# Patient Record
Sex: Female | Born: 1970
Health system: Southern US, Community
[De-identification: ages and names within clinical notes are randomized; demographics above are authoritative.]

## PROBLEM LIST (undated history)

## (undated) DIAGNOSIS — I341 Nonrheumatic mitral (valve) prolapse: Secondary | ICD-10-CM

## (undated) DIAGNOSIS — I10 Essential (primary) hypertension: Secondary | ICD-10-CM

## (undated) DIAGNOSIS — D649 Anemia, unspecified: Secondary | ICD-10-CM

## (undated) DIAGNOSIS — R42 Dizziness and giddiness: Secondary | ICD-10-CM

## (undated) DIAGNOSIS — N946 Dysmenorrhea, unspecified: Secondary | ICD-10-CM

## (undated) DIAGNOSIS — Z803 Family history of malignant neoplasm of breast: Secondary | ICD-10-CM

## (undated) DIAGNOSIS — Z8489 Family history of other specified conditions: Secondary | ICD-10-CM

## (undated) DIAGNOSIS — K219 Gastro-esophageal reflux disease without esophagitis: Secondary | ICD-10-CM

## (undated) DIAGNOSIS — T4145XA Adverse effect of unspecified anesthetic, initial encounter: Secondary | ICD-10-CM

## (undated) DIAGNOSIS — R519 Headache, unspecified: Secondary | ICD-10-CM

## (undated) DIAGNOSIS — C801 Malignant (primary) neoplasm, unspecified: Secondary | ICD-10-CM

## (undated) DIAGNOSIS — R51 Headache: Secondary | ICD-10-CM

## (undated) DIAGNOSIS — R112 Nausea with vomiting, unspecified: Secondary | ICD-10-CM

## (undated) DIAGNOSIS — I059 Rheumatic mitral valve disease, unspecified: Secondary | ICD-10-CM

## (undated) DIAGNOSIS — Z9109 Other allergy status, other than to drugs and biological substances: Secondary | ICD-10-CM

## (undated) DIAGNOSIS — F419 Anxiety disorder, unspecified: Secondary | ICD-10-CM

## (undated) DIAGNOSIS — J45909 Unspecified asthma, uncomplicated: Secondary | ICD-10-CM

## (undated) DIAGNOSIS — T8859XA Other complications of anesthesia, initial encounter: Secondary | ICD-10-CM

## (undated) DIAGNOSIS — Z9889 Other specified postprocedural states: Secondary | ICD-10-CM

## (undated) HISTORY — PX: TONSILLECTOMY: SUR1361

## (undated) HISTORY — PX: MANDIBLE RECONSTRUCTION: SHX431

## (undated) HISTORY — DX: Essential (primary) hypertension: I10

## (undated) HISTORY — DX: Dysmenorrhea, unspecified: N94.6

## (undated) HISTORY — DX: Anxiety disorder, unspecified: F41.9

## (undated) HISTORY — DX: Rheumatic mitral valve disease, unspecified: I05.9

## (undated) HISTORY — DX: Other allergy status, other than to drugs and biological substances: Z91.09

## (undated) HISTORY — DX: Family history of malignant neoplasm of breast: Z80.3

## (undated) HISTORY — PX: BUNIONECTOMY: SHX129

---

## 2005-11-15 ENCOUNTER — Ambulatory Visit: Payer: Self-pay | Admitting: Unknown Physician Specialty

## 2007-05-07 DIAGNOSIS — D219 Benign neoplasm of connective and other soft tissue, unspecified: Secondary | ICD-10-CM | POA: Insufficient documentation

## 2007-06-18 ENCOUNTER — Ambulatory Visit: Payer: Self-pay | Admitting: Unknown Physician Specialty

## 2007-06-18 ENCOUNTER — Other Ambulatory Visit: Payer: Self-pay

## 2007-06-27 ENCOUNTER — Inpatient Hospital Stay: Payer: Self-pay | Admitting: Unknown Physician Specialty

## 2007-07-25 ENCOUNTER — Observation Stay: Payer: Self-pay | Admitting: Unknown Physician Specialty

## 2008-01-31 HISTORY — PX: ABDOMINAL HYSTERECTOMY: SHX81

## 2008-06-19 ENCOUNTER — Ambulatory Visit: Payer: Self-pay | Admitting: Unknown Physician Specialty

## 2008-06-24 ENCOUNTER — Ambulatory Visit: Payer: Self-pay | Admitting: Unknown Physician Specialty

## 2008-12-14 ENCOUNTER — Ambulatory Visit: Payer: Self-pay | Admitting: Podiatry

## 2008-12-18 ENCOUNTER — Ambulatory Visit: Payer: Self-pay | Admitting: Podiatry

## 2009-11-16 ENCOUNTER — Ambulatory Visit: Payer: Self-pay | Admitting: Podiatry

## 2009-11-19 ENCOUNTER — Ambulatory Visit: Payer: Self-pay | Admitting: Podiatry

## 2010-11-10 ENCOUNTER — Ambulatory Visit: Payer: Self-pay | Admitting: Obstetrics and Gynecology

## 2011-10-09 ENCOUNTER — Ambulatory Visit: Payer: Self-pay | Admitting: Family Medicine

## 2012-06-04 ENCOUNTER — Other Ambulatory Visit: Payer: Self-pay | Admitting: Obstetrics and Gynecology

## 2012-06-04 LAB — COMPREHENSIVE METABOLIC PANEL
Alkaline Phosphatase: 45 U/L — ABNORMAL LOW (ref 50–136)
BUN: 13 mg/dL (ref 7–18)
Bilirubin,Total: 0.5 mg/dL (ref 0.2–1.0)
Chloride: 106 mmol/L (ref 98–107)
Creatinine: 0.56 mg/dL — ABNORMAL LOW (ref 0.60–1.30)
EGFR (African American): >60
EGFR (Non-African Amer.): >60
Glucose: 83 mg/dL (ref 65–99)
Potassium: 3.7 mmol/L (ref 3.5–5.1)
SGOT(AST): 25 U/L (ref 15–37)

## 2012-06-04 LAB — TSH: Thyroid Stimulating Horm: 1.69 u[IU]/mL

## 2012-11-25 ENCOUNTER — Ambulatory Visit: Payer: Self-pay | Admitting: Family Medicine

## 2012-11-25 LAB — COMPREHENSIVE METABOLIC PANEL
Alkaline Phosphatase: 60 U/L (ref 50–136)
BUN: 15 mg/dL (ref 7–18)
Calcium, Total: 9.3 mg/dL (ref 8.5–10.1)
Co2: 28 mmol/L (ref 21–32)
Creatinine: 0.73 mg/dL (ref 0.60–1.30)
EGFR (African American): >60
Glucose: 103 mg/dL — ABNORMAL HIGH (ref 65–99)
Osmolality: 275 (ref 275–301)
Potassium: 3.7 mmol/L (ref 3.5–5.1)
SGOT(AST): 18 U/L (ref 15–37)
SGPT (ALT): 17 U/L (ref 12–78)
Sodium: 137 mmol/L (ref 136–145)

## 2012-11-25 LAB — CBC WITH DIFFERENTIAL/PLATELET
Eosinophil #: 0.1 10*3/uL (ref 0.0–0.7)
HCT: 38.1 % (ref 35.0–47.0)
Lymphocyte #: 1.7 10*3/uL (ref 1.0–3.6)
Lymphocyte %: 26.1 %
MCH: 31.4 pg (ref 26.0–34.0)
MCHC: 34.1 g/dL (ref 32.0–36.0)
MCV: 92 fL (ref 80–100)
Monocyte #: 0.4 x10 3/mm (ref 0.2–0.9)
Monocyte %: 5.7 %
Neutrophil %: 65.4 %
Platelet: 223 10*3/uL (ref 150–440)

## 2012-11-25 LAB — AMYLASE: Amylase: 39 U/L (ref 25–115)

## 2012-12-13 ENCOUNTER — Ambulatory Visit: Payer: Self-pay | Admitting: Obstetrics and Gynecology

## 2013-01-21 ENCOUNTER — Emergency Department: Payer: Self-pay | Admitting: Emergency Medicine

## 2014-01-26 ENCOUNTER — Telehealth: Payer: Self-pay | Admitting: *Deleted

## 2014-04-29 NOTE — Telephone Encounter (Signed)
error 

## 2014-06-18 LAB — HM PAP SMEAR: HM Pap smear: NEGATIVE

## 2014-06-18 LAB — TSH: TSH: 2.32 u[IU]/mL (ref <=5.90)

## 2014-12-07 ENCOUNTER — Encounter: Payer: Self-pay | Admitting: *Deleted

## 2014-12-11 ENCOUNTER — Ambulatory Visit: Payer: Self-pay | Admitting: Obstetrics and Gynecology

## 2015-01-12 ENCOUNTER — Other Ambulatory Visit: Payer: Self-pay | Admitting: Obstetrics and Gynecology

## 2015-01-12 DIAGNOSIS — Z1231 Encounter for screening mammogram for malignant neoplasm of breast: Secondary | ICD-10-CM

## 2015-01-14 ENCOUNTER — Other Ambulatory Visit: Payer: Self-pay | Admitting: Obstetrics and Gynecology

## 2015-01-14 ENCOUNTER — Ambulatory Visit
Admission: RE | Admit: 2015-01-14 | Discharge: 2015-01-14 | Disposition: A | Discharge status: Home / Self Care | Payer: 59 | Source: Ambulatory Visit | Attending: Obstetrics and Gynecology | Admitting: Obstetrics and Gynecology

## 2015-01-14 DIAGNOSIS — Z1231 Encounter for screening mammogram for malignant neoplasm of breast: Secondary | ICD-10-CM | POA: Diagnosis present

## 2015-01-20 ENCOUNTER — Ambulatory Visit (INDEPENDENT_AMBULATORY_CARE_PROVIDER_SITE_OTHER): Payer: 59 | Admitting: Family Medicine

## 2015-01-20 ENCOUNTER — Encounter: Payer: Self-pay | Admitting: Family Medicine

## 2015-01-20 VITALS — BP 132/88 | HR 78 | Temp 98.0°F | Resp 12 | Wt 159.0 lb

## 2015-01-20 DIAGNOSIS — K589 Irritable bowel syndrome without diarrhea: Secondary | ICD-10-CM | POA: Insufficient documentation

## 2015-01-20 DIAGNOSIS — B029 Zoster without complications: Secondary | ICD-10-CM | POA: Insufficient documentation

## 2015-01-20 DIAGNOSIS — R002 Palpitations: Secondary | ICD-10-CM

## 2015-01-20 DIAGNOSIS — I059 Rheumatic mitral valve disease, unspecified: Secondary | ICD-10-CM

## 2015-01-20 DIAGNOSIS — I10 Essential (primary) hypertension: Secondary | ICD-10-CM | POA: Insufficient documentation

## 2015-01-20 DIAGNOSIS — F419 Anxiety disorder, unspecified: Secondary | ICD-10-CM | POA: Diagnosis not present

## 2015-01-20 DIAGNOSIS — F32A Depression, unspecified: Secondary | ICD-10-CM | POA: Insufficient documentation

## 2015-01-20 DIAGNOSIS — K5792 Diverticulitis of intestine, part unspecified, without perforation or abscess without bleeding: Secondary | ICD-10-CM | POA: Insufficient documentation

## 2015-01-20 DIAGNOSIS — G43809 Other migraine, not intractable, without status migrainosus: Secondary | ICD-10-CM | POA: Insufficient documentation

## 2015-01-20 DIAGNOSIS — F329 Major depressive disorder, single episode, unspecified: Secondary | ICD-10-CM | POA: Insufficient documentation

## 2015-01-20 DIAGNOSIS — G4700 Insomnia, unspecified: Secondary | ICD-10-CM | POA: Insufficient documentation

## 2015-01-20 NOTE — Progress Notes (Signed)
Patient ID: Shelby [name], female   DOB: 02/23/1970, 44 y.o.   MRN: [id number]    Subjective:  HPI  Patient is here to discuss palpatations that started this morning, off and on. She did not eat this morning and just had coffee and water so she tried to eat and drink water and felt like her palpatations went away but noticed as soon as she stood up and tried to walk symptoms would come back. She states they were so severe nad so frequent that it took her breath away. No chest pain. Some dizziness with symptoms.  Prior to Admission medications   Medication Sig Start Date End Date Taking? Authorizing Provider  ergocalciferol (VITAMIN D2) 50000 UNITS capsule Take 50,000 Units by mouth once a week.    Historical Provider, MD  L-Methylfolate-Algae (DEPLIN 7.5) 7.5-90.314 MG CAPS Take 1 capsule by mouth daily. 05/18/14   Historical Provider, MD  losartan (COZAAR) 50 MG tablet Take 1 tablet by mouth daily. 04/27/14   Historical Provider, MD    There are no active problems to display for this patient.   Past Medical History  Diagnosis Date  . Anxiety   . Hypertension   . Family history of breast cancer   . Environmental allergies   . Painful menstrual periods     Social History   Social History  . Marital Status: Married    Spouse Name: N/A  . Number of Children: N/A  . Years of Education: N/A   Occupational History  . Not on file.   Social History Main Topics  . Smoking status: Former Research scientist (life sciences)  . Smokeless tobacco: Never Used  . Alcohol Use: Yes     Comment: occas  . Drug Use: No  . Sexual Activity: Yes   Other Topics Concern  . Not on file   Social History Narrative    Allergies  Allergen Reactions  . Shellfish Allergy     Review of Systems  Constitutional: Negative.   Respiratory: Positive for cough (from palpitations) and shortness of breath.   Cardiovascular: Positive for palpitations.  Musculoskeletal: Negative.   Neurological: Positive for dizziness.    Psychiatric/Behavioral: Negative.      There is no immunization history on file for this patient. Objective:  BP 132/88 mmHg  Pulse 78  Temp(Src) 98 F (36.7 C)  Resp 12  Wt 159 lb (72.122 kg)  SpO2 98%  Physical Exam  Constitutional: She is oriented to person, place, and time and well-developed, well-nourished, and in no distress.  HENT:  Head: Normocephalic and atraumatic.  Right Ear: External ear normal.  Left Ear: External ear normal.  Nose: Nose normal.  Eyes: Conjunctivae are normal. Pupils are equal, round, and reactive to light.  Neck: Normal range of motion. Neck supple. No thyromegaly present.  Cardiovascular: Normal rate, regular rhythm, normal heart sounds and intact distal pulses.   No murmur heard. Pulmonary/Chest: Effort normal and breath sounds normal. No respiratory distress. She has no wheezes.  Abdominal: Soft.  Musculoskeletal: Normal range of motion. She exhibits no edema or tenderness.  Lymphadenopathy:    She has no cervical adenopathy.  Neurological: She is alert and oriented to person, place, and time. Gait normal.  Skin: Skin is warm and dry.  Psychiatric: Mood, memory, affect and judgment normal.    Lab Results  Component Value Date   WBC 6.4 11/25/2012   HGB 13.0 11/25/2012   HCT 38.1 11/25/2012   PLT 223 11/25/2012   GLUCOSE 103* 11/25/2012  TSH 2.32 06/18/2014    CMP     Component Value Date/Time   NA 137 11/25/2012 1542   K 3.7 11/25/2012 1542   CL 103 11/25/2012 1542   CO2 28 11/25/2012 1542   GLUCOSE 103* 11/25/2012 1542   BUN 15 11/25/2012 1542   CREATININE 0.73 11/25/2012 1542   CALCIUM 9.3 11/25/2012 1542   PROT 7.4 11/25/2012 1542   ALBUMIN 4.0 11/25/2012 1542   AST 18 11/25/2012 1542   ALT 17 11/25/2012 1542   ALKPHOS 60 11/25/2012 1542   BILITOT 0.3 11/25/2012 1542   GFRNONAA >60 11/25/2012 1542   GFRAA >60 11/25/2012 1542    Assessment and Plan :  1. Palpitation EKG stable. Discussed this with patient in  detail. Discussed getting holter monitor vs cardiology referral to Dr. [name].Start ASA daily. Will go ahead and set up with cardiologist and get them to set up holter monitor to read. I think this is benign. - EKG 12-Lead  2. Disorder of mitral valve advised patient with her having this she could be more prone to having irregular beats. Follow.  3. Anxiety Stable.  I have done the exam and reviewed the above chart and it is accurate to the best of my knowledge.  Patient was seen and examined by Dr. Eulas Post and note was scribed by Theressa Millard, RMA.    Miguel Aschoff MD [facility] 01/20/2015 4:16 PM

## 2015-01-22 LAB — LIPID PANEL WITH LDL/HDL RATIO
CHOLESTEROL TOTAL: 225 mg/dL — AB (ref 100–199)
HDL: 96 mg/dL (ref >39)
LDL Calculated: 120 mg/dL — ABNORMAL HIGH (ref 0–99)
LDl/HDL Ratio: 1.3 ratio units (ref 0.0–3.2)
TRIGLYCERIDES: 47 mg/dL (ref 0–149)
VLDL Cholesterol Cal: 9 mg/dL (ref 5–40)

## 2015-01-22 LAB — CBC WITH DIFFERENTIAL/PLATELET
BASOS: 0 %
Basophils Absolute: 0 10*3/uL (ref 0.0–0.2)
EOS (ABSOLUTE): 0.1 10*3/uL (ref 0.0–0.4)
EOS: 2 %
HEMATOCRIT: 38.9 % (ref 34.0–46.6)
Hemoglobin: 13.5 g/dL (ref 11.1–15.9)
Immature Grans (Abs): 0 10*3/uL (ref 0.0–0.1)
Immature Granulocytes: 0 %
LYMPHS ABS: 1.9 10*3/uL (ref 0.7–3.1)
Lymphs: 36 %
MCH: 31.5 pg (ref 26.6–33.0)
MCHC: 34.7 g/dL (ref 31.5–35.7)
MCV: 91 fL (ref 79–97)
MONOS ABS: 0.3 10*3/uL (ref 0.1–0.9)
Monocytes: 6 %
Neutrophils Absolute: 3 10*3/uL (ref 1.4–7.0)
Neutrophils: 56 %
Platelets: 259 10*3/uL (ref 150–379)
RBC: 4.28 x10E6/uL (ref 3.77–5.28)
RDW: 13 % (ref 12.3–15.4)
WBC: 5.4 10*3/uL (ref 3.4–10.8)

## 2015-01-22 LAB — COMPREHENSIVE METABOLIC PANEL
ALBUMIN: 4.6 g/dL (ref 3.5–5.5)
ALK PHOS: 49 IU/L (ref 39–117)
ALT: 17 IU/L (ref 0–32)
AST: 25 IU/L (ref 0–40)
Albumin/Globulin Ratio: 1.9 (ref 1.1–2.5)
BILIRUBIN TOTAL: 0.4 mg/dL (ref 0.0–1.2)
BUN / CREAT RATIO: 23 (ref 9–23)
BUN: 16 mg/dL (ref 6–24)
CHLORIDE: 98 mmol/L (ref 96–106)
CO2: 25 mmol/L (ref 18–29)
CREATININE: 0.7 mg/dL (ref 0.57–1.00)
Calcium: 10 mg/dL (ref 8.7–10.2)
GFR calc Af Amer: 120 mL/min/{1.73_m2} (ref >59)
GFR calc non Af Amer: 104 mL/min/{1.73_m2} (ref >59)
GLOBULIN, TOTAL: 2.4 g/dL (ref 1.5–4.5)
Glucose: 91 mg/dL (ref 65–99)
POTASSIUM: 4.4 mmol/L (ref 3.5–5.2)
SODIUM: 138 mmol/L (ref 134–144)
Total Protein: 7 g/dL (ref 6.0–8.5)

## 2015-01-22 LAB — TSH: TSH: 1.79 u[IU]/mL (ref 0.450–4.500)

## 2015-01-27 ENCOUNTER — Telehealth: Payer: Self-pay | Admitting: Family Medicine

## 2015-01-27 NOTE — Telephone Encounter (Signed)
Please review-aa 

## 2015-01-27 NOTE — Telephone Encounter (Signed)
Pt states that you had talked with her about a holter moniter.She is wondering if she should get this done prior to seeing Dr [name].She states that if holter moniter results are normal she may not need to see Dr [name]

## 2015-02-02 NOTE — Telephone Encounter (Signed)
Not instead of--ok to set up Holter or if seeing Dr Candis Musa soon ok to let them do it.

## 2015-02-02 NOTE — Telephone Encounter (Signed)
Advised patient to keep appt with Dr. [name] and they will do Holter monitor at their office.

## 2015-02-04 ENCOUNTER — Encounter: Payer: Self-pay | Admitting: Family Medicine

## 2015-02-15 ENCOUNTER — Encounter: Payer: Self-pay | Admitting: Cardiovascular Disease

## 2015-02-15 ENCOUNTER — Ambulatory Visit (INDEPENDENT_AMBULATORY_CARE_PROVIDER_SITE_OTHER): Payer: 59 | Admitting: Cardiovascular Disease

## 2015-02-15 VITALS — BP 120/78 | HR 73 | Ht 67.0 in | Wt 157.5 lb

## 2015-02-15 DIAGNOSIS — I10 Essential (primary) hypertension: Secondary | ICD-10-CM

## 2015-02-15 DIAGNOSIS — I059 Rheumatic mitral valve disease, unspecified: Secondary | ICD-10-CM | POA: Diagnosis not present

## 2015-02-15 DIAGNOSIS — R002 Palpitations: Secondary | ICD-10-CM | POA: Diagnosis not present

## 2015-02-15 DIAGNOSIS — F419 Anxiety disorder, unspecified: Secondary | ICD-10-CM

## 2015-02-15 MED ORDER — PROPRANOLOL HCL 10 MG PO TABS
10.0000 mg | ORAL_TABLET | Freq: Three times a day (TID) | ORAL | Status: DC | PRN
Start: 2015-02-15 — End: 2016-02-07

## 2015-02-15 NOTE — Patient Instructions (Signed)
I think you are having APCs (PAC) extra beats from the top Others have PVCs  Please monitor pulse on your phone  Take propranolol as needed 1 to 2 pills for bad palpitations  Call if you would like a 48 hour monitor or 30 day monitor  Please call us if you have new issues that need to be addressed before your next appt.

## 2015-02-15 NOTE — Assessment & Plan Note (Signed)
Some of her palpitations are ectopy could be exacerbated by work stress or other stressors.  She does not feel depressed.

## 2015-02-15 NOTE — Assessment & Plan Note (Signed)
Etiology of her symptoms is unclear, likely APCs, unable to exclude PVCs.  She is concerned about atrial fibrillation given strong family history, mother and son,   We have recommended if she has recurrent symptoms that she contact our office for either 48 hour Holter or 30 day monitor.   We have given her a prescription for propranolol to take as needed,  10 mg dose with repeat if needed  She will also monitor her heart rate using her pulse meter on her phone  recommended she come into the office if she has recurrent symptoms for urgent EKG

## 2015-02-15 NOTE — Progress Notes (Signed)
Patient ID: Shelby [name], female    DOB: November 05, 1970, 45 y.o.   MRN: [id number]  HPI Comments:  Shelby [name]  Is a very pleasant 45  Year old woman with notes indicating history of mitral valve prolapse , murmur in her 17s who presents by referral from Dr. [name] for  palpitations, lightheadedness.    Through part of December 2016 she developed palpitations, skipping with pauses.  Symptoms seem to get worse when she was walking,  Went for several days, sometimes appreciated palpitations at nighttime.  Work has been very stressful.  Also recently separated from her husband who is alcoholic.    Palpitation symptoms persisted for several days before resolving.  Seem to come and go since that time.  EKGs showing no arrhythmia, only normal sinus rhythm  She is very concerned about her palpitations given mother with history of atrial fibrillation,  Also has a son with atrial fibrillation.   She had ectopy when she was young ,  He seemed to resolve without intervention.  She's never taken medications for symptoms, never had Holter monitor.   Otherwise she reports that she is doing well, active, healthy with good exercise tolerance   EKG on today's visit shows normal sinus rhythm with rate 73 bpm, no significant ST or T-wave changes   Allergies  Allergen Reactions  . Shellfish Allergy     Current Outpatient Prescriptions on File Prior to Visit  Medication Sig Dispense Refill  . ALPRAZolam (XANAX) 0.5 MG tablet Take 0.5 mg by mouth as needed.     Marland Kitchen EPINEPHRINE, ANAPHYLAXIS THERAPY AGENTS, EPIPEN, 0.3MG /0.3ML (Injection Device)  1 Device As directed for 0 days  Quantity: 1;  Refills: 3   Ordered :06-Oct-2010  Reginia Forts MD;  Started 06-Oct-2010 Active    . Hyoscyamine Sulfate 0.375 MG TBCR Take by mouth as needed.     Marland Kitchen ibuprofen (ADVIL,MOTRIN) 800 MG tablet Take by mouth every 6 (six) hours as needed.     Marland Kitchen L-Methylfolate-Algae (DEPLIN 7.5) 7.5-90.314 MG CAPS Take 1 capsule by mouth  daily.    Marland Kitchen losartan (COZAAR) 50 MG tablet Take 50 mg by mouth daily.     . mometasone (NASONEX) 50 MCG/ACT nasal spray Place 2 sprays into the nose daily as needed.        Past Medical History  Diagnosis Date  . Anxiety   . Hypertension   . Family history of breast cancer   . Environmental allergies   . Painful menstrual periods   . Mitral valve disease     Past Surgical History  Procedure Laterality Date  . Abdominal hysterectomy  2010    supracervical   . Tonsillectomy      Social History  reports that she has quit smoking. She has never used smokeless tobacco. She reports that she drinks alcohol. She reports that she does not use illicit drugs.  Family History family history includes Breast cancer in her maternal grandmother and mother; Cancer in her maternal uncle; Cancer (age of onset: 60) in her mother; Cancer (age of onset: 24) in her maternal grandmother; Diabetes in her father.   Review of Systems  Constitutional: Negative.   Respiratory: Negative.   Cardiovascular: Positive for palpitations.  Gastrointestinal: Negative.   Musculoskeletal: Negative.   Neurological: Negative.   Hematological: Negative.   Psychiatric/Behavioral: The patient is nervous/anxious.   All other systems reviewed and are negative.   BP 120/78 mmHg  Pulse 73  Ht 5\' 7"  (1.702 m)  Wt  157 lb 8 oz (71.442 kg)  BMI 24.66 kg/m2   Physical Exam  Constitutional: She is oriented to person, place, and time. She appears well-developed and well-nourished.  HENT:  Head: Normocephalic.  Nose: Nose normal.  Mouth/Throat: Oropharynx is clear and moist.  Eyes: Conjunctivae are normal. Pupils are equal, round, and reactive to light.  Neck: Normal range of motion. Neck supple. No JVD present.  Cardiovascular: Normal rate, regular rhythm, normal heart sounds and intact distal pulses.  Exam reveals no gallop and no friction rub.   No murmur heard. Pulmonary/Chest: Effort normal and breath sounds  normal. No respiratory distress. She has no wheezes. She has no rales. She exhibits no tenderness.  Abdominal: Soft. Bowel sounds are normal. She exhibits no distension. There is no tenderness.  Musculoskeletal: Normal range of motion. She exhibits no edema or tenderness.  Lymphadenopathy:    She has no cervical adenopathy.  Neurological: She is alert and oriented to person, place, and time. Coordination normal.  Skin: Skin is warm and dry. No rash noted. No erythema.  Psychiatric: She has a normal mood and affect. Her behavior is normal. Judgment and thought content normal.

## 2015-02-15 NOTE — Assessment & Plan Note (Signed)
Blood pressure well controlled  If needed, we could change her losartan to something such as bystolic  If heart rate control and blood pressure control as needed.

## 2015-02-15 NOTE — Assessment & Plan Note (Signed)
No significant murmur appreciated on exam  She reports  Having prior echocardiograms in the past, some of which have documented mitral valve prolapse, others which made no mention.  Likely of little clinical significance, no further echocardiogram needed.

## 2015-02-22 ENCOUNTER — Ambulatory Visit: Payer: 59 | Admitting: Family Medicine

## 2015-03-10 ENCOUNTER — Ambulatory Visit
Admission: RE | Admit: 2015-03-10 | Discharge: 2015-03-10 | Disposition: A | Discharge status: Home / Self Care | Payer: 59 | Source: Ambulatory Visit | Attending: Family Medicine | Admitting: Family Medicine

## 2015-03-10 ENCOUNTER — Ambulatory Visit (INDEPENDENT_AMBULATORY_CARE_PROVIDER_SITE_OTHER): Payer: 59 | Admitting: Family Medicine

## 2015-03-10 ENCOUNTER — Telehealth: Payer: Self-pay

## 2015-03-10 ENCOUNTER — Encounter: Payer: Self-pay | Admitting: Family Medicine

## 2015-03-10 VITALS — BP 120/72 | HR 84 | Temp 99.0°F | Resp 16 | Wt 156.0 lb

## 2015-03-10 DIAGNOSIS — R05 Cough: Secondary | ICD-10-CM | POA: Diagnosis not present

## 2015-03-10 DIAGNOSIS — R079 Chest pain, unspecified: Secondary | ICD-10-CM | POA: Insufficient documentation

## 2015-03-10 DIAGNOSIS — J40 Bronchitis, not specified as acute or chronic: Secondary | ICD-10-CM | POA: Insufficient documentation

## 2015-03-10 DIAGNOSIS — J029 Acute pharyngitis, unspecified: Secondary | ICD-10-CM | POA: Insufficient documentation

## 2015-03-10 MED ORDER — AMOXICILLIN-POT CLAVULANATE 875-125 MG PO TABS: 1.0000 | ORAL_TABLET | Freq: Two times a day (BID) | ORAL | Status: DC

## 2015-03-10 NOTE — Progress Notes (Signed)
Patient ID: Shelby [name], female   DOB: [birth year], 45 y.o.   MRN: [id number]         [name] Female    DOB: Apr 14, 1970   45 y.o.   MRN: [id number] Visit Date: 03/10/2015  Today's Provider: Margarita Rana, MD   Chief Complaint  Patient presents with  . Cough   Subjective:    Cough This is a new problem. The current episode started in the past 7 days. The problem has been unchanged. The cough is productive of sputum. Associated symptoms include chest pain, myalgias, postnasal drip and wheezing. Pertinent negatives include no chills, ear congestion, ear pain, eye redness, fever, headaches, hemoptysis, nasal congestion, rash, rhinorrhea, sore throat, shortness of breath, sweats or weight loss.       Allergies  Allergen Reactions  . Shellfish Allergy    Previous Medications   ALPRAZOLAM (XANAX) 0.5 MG TABLET    Take 0.5 mg by mouth as needed.    BUDESONIDE (RHINOCORT ALLERGY) 32 MCG/ACT NASAL SPRAY    Place 2 sprays into both nostrils daily.   EPINEPHRINE, ANAPHYLAXIS THERAPY AGENTS,    EPIPEN, 0.3MG /0.3ML (Injection Device)  1 Device As directed for 0 days  Quantity: 1;  Refills: 3   Ordered :06-Oct-2010  Reginia Forts MD;  Started 06-Oct-2010 Active   HYOSCYAMINE SULFATE 0.375 MG TBCR    Take by mouth as needed.    IBUPROFEN (ADVIL,MOTRIN) 800 MG TABLET    Take by mouth every 6 (six) hours as needed.    L-METHYLFOLATE-ALGAE (DEPLIN 7.5) 7.5-90.314 MG CAPS    Take 1 capsule by mouth daily.   LOSARTAN (COZAAR) 50 MG TABLET    Take 50 mg by mouth daily.    MAGNESIUM GLUCONATE (MAGNESIUM 27 PO)    Take by mouth as needed.   MONTELUKAST (SINGULAIR) 10 MG TABLET    Take 10 mg by mouth at bedtime.   PROPRANOLOL (INDERAL) 10 MG TABLET    Take 1 tablet (10 mg total) by mouth 3 (three) times daily as needed.   VITAMIN C (ASCORBIC ACID) 500 MG TABLET    Take 500 mg by mouth daily.    Review of Systems  Constitutional: Positive for fatigue. Negative for fever,  chills, weight loss, diaphoresis, activity change, appetite change and unexpected weight change.  HENT: Positive for postnasal drip and voice change. Negative for ear discharge, ear pain, facial swelling, rhinorrhea, sinus pressure, sneezing, sore throat, tinnitus and trouble swallowing.   Eyes: Negative for photophobia, discharge, redness, itching and visual disturbance.  Respiratory: Positive for cough, choking, chest tightness and wheezing. Negative for apnea, hemoptysis, shortness of breath and stridor.   Cardiovascular: Positive for chest pain. Negative for palpitations and leg swelling.  Gastrointestinal: Negative.   Musculoskeletal: Positive for myalgias.  Skin: Negative for rash.  Neurological: Negative for dizziness, light-headedness and headaches.    Social History  Substance Use Topics  . Smoking status: Former Research scientist (life sciences)  . Smokeless tobacco: Never Used  . Alcohol Use: Yes     Comment: occas   Objective:   BP 120/72 mmHg  Pulse 84  Temp(Src) 99 F (37.2 C) (Oral)  Resp 16  Wt 156 lb (70.761 kg)  Physical Exam  Constitutional: She is oriented to person, place, and time. She appears well-developed and well-nourished.  HENT:  Head: Normocephalic and atraumatic.  Right Ear: External ear normal.  Left Ear: External ear normal.  Mouth/Throat: Oropharynx is clear and moist.  Eyes: Conjunctivae and EOM are normal.  Pupils are equal, round, and reactive to light.  Neck: Normal range of motion. Neck supple.  Cardiovascular: Normal rate and regular rhythm.   Pulmonary/Chest: Effort normal and breath sounds normal.  Neurological: She is alert and oriented to person, place, and time.  Psychiatric: She has a normal mood and affect. Her behavior is normal. Judgment and thought content normal.      Assessment & Plan:     1. Bronchitis New problem, also with pharyngitis. Concerning for aspiration. Will check CXR and treat. Further plan pending these results.  Patient instructed to  call back if condition worsens or does not improve.    - DG Chest 2 View; Future - amoxicillin-clavulanate (AUGMENTIN) 875-125 MG tablet; Take 1 tablet by mouth 2 (two) times daily.  Dispense: 14 tablet; Refill: 0   Patient was seen and examined by Jerrell Belfast, MD, and note scribed by Ashley Royalty, CMA.  I have reviewed the document for accuracy and completeness and I agree with above. - Jerrell Belfast, MD   Margarita Rana, MD       Margarita Rana, MD  [facility]

## 2015-03-10 NOTE — Telephone Encounter (Signed)
-----   Message from Margarita Rana, MD sent at 03/10/2015  1:43 PM EST ----- Normal CXR. Please notify patient. Thanks.

## 2015-03-10 NOTE — Telephone Encounter (Signed)
Pt advised as directed below.   Thanks,   -Laura  

## 2015-03-15 ENCOUNTER — Ambulatory Visit: Payer: 59 | Admitting: Cardiovascular Disease

## 2015-03-17 ENCOUNTER — Telehealth: Payer: Self-pay | Admitting: *Deleted

## 2015-03-17 DIAGNOSIS — I493 Ventricular premature depolarization: Secondary | ICD-10-CM

## 2015-03-17 NOTE — Telephone Encounter (Signed)
Which one do you want her to have? Her note indicates either and she doesn't have a preference.

## 2015-03-17 NOTE — Telephone Encounter (Signed)
Pt calling stating she has decided to do a monitor  She is open to doing the 30 day or 48 hour  It is up to the doctor Please advise.

## 2015-03-18 NOTE — Telephone Encounter (Signed)
Patient c/o Palpitations:  High priority if patient c/o lightheadedness and shortness of breath.  1. How long have you been having palpitations? Just having them here and there  2. Are you currently experiencing lightheadedness and shortness of breath? Does cause a little sob but not all the time  3. Have you checked your BP and heart rate? (document readings) does not have any   4. Are you experiencing any other symptoms? Was sick last week not sure if this is causing that

## 2015-03-18 NOTE — Telephone Encounter (Signed)
Please do a 30-day event monitor for the patient as she does have a family history of atrial fibrillation and frequent PVC's.

## 2015-03-19 NOTE — Telephone Encounter (Signed)
Spoke w/ pt.  Advised her of Brittany's recommendation.  She is agreeable and will expect call from [employer] today.

## 2015-03-24 ENCOUNTER — Encounter (INDEPENDENT_AMBULATORY_CARE_PROVIDER_SITE_OTHER): Payer: 59

## 2015-03-24 DIAGNOSIS — I493 Ventricular premature depolarization: Secondary | ICD-10-CM | POA: Diagnosis not present

## 2015-03-24 DIAGNOSIS — R002 Palpitations: Secondary | ICD-10-CM | POA: Diagnosis not present

## 2015-03-25 DIAGNOSIS — H5213 Myopia, bilateral: Secondary | ICD-10-CM | POA: Diagnosis not present

## 2015-03-29 ENCOUNTER — Ambulatory Visit (INDEPENDENT_AMBULATORY_CARE_PROVIDER_SITE_OTHER): Payer: 59 | Admitting: Family Medicine

## 2015-03-29 VITALS — BP 122/70 | HR 76 | Temp 98.1°F | Resp 16 | Wt 152.0 lb

## 2015-03-29 DIAGNOSIS — J301 Allergic rhinitis due to pollen: Secondary | ICD-10-CM | POA: Diagnosis not present

## 2015-03-29 DIAGNOSIS — Z91048 Other nonmedicinal substance allergy status: Secondary | ICD-10-CM | POA: Diagnosis not present

## 2015-03-29 DIAGNOSIS — J45909 Unspecified asthma, uncomplicated: Secondary | ICD-10-CM | POA: Diagnosis not present

## 2015-03-29 DIAGNOSIS — Z9109 Other allergy status, other than to drugs and biological substances: Secondary | ICD-10-CM

## 2015-03-29 MED ORDER — ALBUTEROL SULFATE HFA 108 (90 BASE) MCG/ACT IN AERS: 2.0000 | INHALATION_SPRAY | Freq: Four times a day (QID) | RESPIRATORY_TRACT | Status: DC | PRN

## 2015-03-29 MED ORDER — EPINEPHRINE 0.3 MG/0.3ML IJ SOAJ: 0.3000 mg | Freq: Once | INTRAMUSCULAR | Status: DC

## 2015-03-29 NOTE — Progress Notes (Signed)
Patient ID: Shelby [name], female   DOB: 20-Aug-1970, 45 y.o.   MRN: [id number]   Shelby [name]  MRN: [id number] DOB: 03-14-1970  Subjective:  HPI   1. Pollen allergies The patient is a 45 year old female who presents for evaluation of cough and throat irritation, believed to be secondary to allergies.  The patient has a history of pollen allergies.  The patient feels that her chest is somewhat tight.  She states her throat gets worse at night and her congestion is worse in the morning.  She has had wheezing in her throat from drainage and also cough that she gets from a tickle.   Patient Active Problem List   Diagnosis Date Noted  . Bronchitis 03/10/2015  . Palpitations 02/15/2015  . Anxiety 01/20/2015  . Clinical depression 01/20/2015  . Diverticulitis 01/20/2015  . BP (high blood pressure) 01/20/2015  . Adaptive colitis 01/20/2015  . Cannot sleep 01/20/2015  . Headache, variant migraine 01/20/2015  . Disorder of mitral valve 01/20/2015  . Herpes zona 01/20/2015  . Fibroid 05/07/2007  . Allergic rhinitis 10/29/2006    Past Medical History  Diagnosis Date  . Anxiety   . Hypertension   . Family history of breast cancer   . Environmental allergies   . Painful menstrual periods   . Mitral valve disease     Social History   Social History  . Marital Status: Married    Spouse Name: N/A  . Number of Children: N/A  . Years of Education: N/A   Occupational History  . Not on file.   Social History Main Topics  . Smoking status: Former Research scientist (life sciences)  . Smokeless tobacco: Never Used  . Alcohol Use: Yes     Comment: occas  . Drug Use: No  . Sexual Activity: Yes   Other Topics Concern  . Not on file   Social History Narrative    Outpatient Prescriptions Prior to Visit  Medication Sig Dispense Refill  . EPINEPHRINE, ANAPHYLAXIS THERAPY AGENTS, EPIPEN, 0.3MG /0.3ML (Injection Device)  1 Device As directed for 0 days  Quantity: 1;  Refills: 3   Ordered  :06-Oct-2010  Reginia Forts MD;  Started 06-Oct-2010 Active    . Hyoscyamine Sulfate 0.375 MG TBCR Take by mouth as needed.     Marland Kitchen ibuprofen (ADVIL,MOTRIN) 800 MG tablet Take by mouth every 6 (six) hours as needed.     Marland Kitchen L-Methylfolate-Algae (DEPLIN 7.5) 7.5-90.314 MG CAPS Take 1 capsule by mouth daily.    Marland Kitchen losartan (COZAAR) 50 MG tablet Take 50 mg by mouth daily.     . Magnesium Gluconate (MAGNESIUM 27 PO) Take by mouth as needed.    . montelukast (SINGULAIR) 10 MG tablet Take 10 mg by mouth at bedtime.    . propranolol (INDERAL) 10 MG tablet Take 1 tablet (10 mg total) by mouth 3 (three) times daily as needed. 90 tablet 3  . vitamin C (ASCORBIC ACID) 500 MG tablet Take 500 mg by mouth daily.    . budesonide (RHINOCORT ALLERGY) 32 MCG/ACT nasal spray Place 2 sprays into both nostrils daily. Reported on 03/29/2015    . ALPRAZolam (XANAX) 0.5 MG tablet Take 0.5 mg by mouth as needed.     Marland Kitchen amoxicillin-clavulanate (AUGMENTIN) 875-125 MG tablet Take 1 tablet by mouth 2 (two) times daily. 14 tablet 0   No facility-administered medications prior to visit.    Allergies  Allergen Reactions  . Shellfish Allergy     Review of Systems  Constitutional:  Negative for fever, chills and malaise/fatigue.  Eyes: Negative.   Respiratory: Positive for sputum production (minimal clear sputum, patient feels it is from drainage and not coming from her chest.) and wheezing. Negative for cough and shortness of breath.   Gastrointestinal: Negative.   Musculoskeletal: Negative.   Neurological: Negative for dizziness, weakness and headaches.  Endo/Heme/Allergies: Negative.   Psychiatric/Behavioral: Negative.    Objective:  BP 122/70 mmHg  Pulse 76  Temp(Src) 98.1 F (36.7 C) (Oral)  Resp 16  Wt 152 lb (68.947 kg)  Physical Exam  Constitutional: She is oriented to person, place, and time and well-developed, well-nourished, and in no distress.  HENT:  Head: Normocephalic and atraumatic.  Right Ear:  External ear normal.  Left Ear: External ear normal.  Nose: Nose normal.  Eyes: Conjunctivae are normal.  Neck: Neck supple.  Cardiovascular: Normal rate, regular rhythm and normal heart sounds.   Pulmonary/Chest: Effort normal and breath sounds normal.  Abdominal: Soft.  Neurological: She is alert and oriented to person, place, and time.  Skin: Skin is warm and dry.  Psychiatric: Mood, memory, affect and judgment normal.    Assessment and Plan :  Allergic rhinitis This is the most likely etiology of these symptoms. We'll treat this aggressively and then consider reflux if this persists. Mild generalized anxiety Presently controlled. Miguel Aschoff MD [facility] 03/29/2015 3:38 PM

## 2015-04-01 ENCOUNTER — Encounter: Payer: Self-pay | Admitting: Obstetrics and Gynecology

## 2015-04-01 ENCOUNTER — Ambulatory Visit (INDEPENDENT_AMBULATORY_CARE_PROVIDER_SITE_OTHER): Payer: 59 | Admitting: Obstetrics and Gynecology

## 2015-04-01 VITALS — BP 128/92 | HR 98 | Ht 67.0 in | Wt 151.0 lb

## 2015-04-01 DIAGNOSIS — N898 Other specified noninflammatory disorders of vagina: Secondary | ICD-10-CM | POA: Diagnosis not present

## 2015-04-01 DIAGNOSIS — N939 Abnormal uterine and vaginal bleeding, unspecified: Secondary | ICD-10-CM

## 2015-04-01 NOTE — Progress Notes (Signed)
Subjective:     Patient ID: Shelby [name], female   DOB: 05-16-1970, 45 y.o.   MRN: [id number]  HPI Vaginal light pink spotting with RLQ pain x 24 hours, reports another episode a few months ago. Denies any sexual activity in months due to separation from spouse, but no postcoital spotting before then.  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  Review of Systems See above    Objective:   Physical Exam A&Ox 4, tearful but no acute distress Well groomed female Blood pressure 128/92, pulse 98, height 5\' 7"  (1.702 m), weight 151 lb (68.493 kg).abdomen soft and non-tender Pelvic exam: VULVA: normal appearing vulva with no masses, tenderness or lesions, VAGINA: normal appearing vagina with normal color and discharge, no lesions, CERVIX: cervical discharge present - scant and pink, multiparous os, UTERUS: surgically absent, vaginal cuff well healed, ADNEXA: normal adnexa in size, nontender and no masses.    Assessment:     Cervical spotting S/p hysterectomy     Plan:     Reassured  RTC if spotting persist or returns  Melody Blum, [place]

## 2015-04-03 ENCOUNTER — Telehealth: Payer: Self-pay | Admitting: Family Medicine

## 2015-04-03 DIAGNOSIS — J40 Bronchitis, not specified as acute or chronic: Secondary | ICD-10-CM

## 2015-04-03 MED ORDER — AZITHROMYCIN 250 MG PO TABS: ORAL_TABLET | ORAL | Status: DC

## 2015-04-03 NOTE — Telephone Encounter (Signed)
Patient with worsening cough. Will send in Garden. Patient instructed to call back if condition worsens or does not improve.

## 2015-04-23 NOTE — Telephone Encounter (Signed)
Left message for pt that [employer] has not received a baseline reading on her yet. Asked her to call back and let us know if she is planning on wearing the monitor or returning it.

## 2015-05-11 ENCOUNTER — Encounter: Payer: Self-pay | Admitting: Cardiovascular Disease

## 2015-05-25 ENCOUNTER — Other Ambulatory Visit: Payer: Self-pay | Admitting: Family Medicine

## 2015-05-27 ENCOUNTER — Ambulatory Visit (INDEPENDENT_AMBULATORY_CARE_PROVIDER_SITE_OTHER): Payer: 59 | Admitting: Family Medicine

## 2015-05-27 ENCOUNTER — Encounter: Payer: Self-pay | Admitting: Family Medicine

## 2015-05-27 VITALS — BP 122/72 | Temp 98.1°F | Resp 16 | Wt 151.0 lb

## 2015-05-27 DIAGNOSIS — F40243 Fear of flying: Secondary | ICD-10-CM

## 2015-05-27 DIAGNOSIS — A09 Infectious gastroenteritis and colitis, unspecified: Secondary | ICD-10-CM | POA: Diagnosis not present

## 2015-05-27 MED ORDER — ONDANSETRON 8 MG PO TBDP: 8.0000 mg | ORAL_TABLET | Freq: Three times a day (TID) | ORAL | Status: DC | PRN

## 2015-05-27 MED ORDER — CIPROFLOXACIN HCL 500 MG PO TABS: 500.0000 mg | ORAL_TABLET | Freq: Two times a day (BID) | ORAL | Status: DC

## 2015-05-27 MED ORDER — AZITHROMYCIN 250 MG PO TABS: ORAL_TABLET | ORAL | Status: DC

## 2015-05-27 MED ORDER — ALPRAZOLAM 0.5 MG PO TABS: 0.5000 mg | ORAL_TABLET | ORAL | Status: DC | PRN

## 2015-05-27 NOTE — Progress Notes (Signed)
Patient ID: Shelby [name], female   DOB: 1970/08/30, 45 y.o.   MRN: [id number]       [name] Female    DOB: [birth year]   45 y.o.   MRN: [id number] Visit Date: 05/27/2015  Today's Provider: Wilhemena Durie, MD   Chief Complaint  Patient presents with  . fear of flying   Subjective:    HPI  Patient is going out of the country next week, and she is requesting anxiety medication to help relax her during her flight. Patient is also requesting antibiotics to have on hand so she can prevent herself from becoming sick while she is in [place].  Patient can not remember when her last Tdap was, but thinks it has been less than 10 years ago.     Allergies  Allergen Reactions  . Shellfish Allergy    Previous Medications   ALBUTEROL (PROVENTIL HFA;VENTOLIN HFA) 108 (90 BASE) MCG/ACT INHALER    Inhale 2 puffs into the lungs every 6 (six) hours as needed for wheezing or shortness of breath.   AZITHROMYCIN (ZITHROMAX) 250 MG TABLET    2 today and then one a day for 4 more days.   BUDESONIDE (RHINOCORT ALLERGY) 32 MCG/ACT NASAL SPRAY    Place 2 sprays into both nostrils daily. Reported on 04/01/2015   EPINEPHRINE 0.3 MG/0.3 ML IJ SOAJ INJECTION    Inject 0.3 mLs (0.3 mg total) into the muscle once.   EPINEPHRINE, ANAPHYLAXIS THERAPY AGENTS,    EPIPEN, 0.3MG /0.3ML (Injection Device)  1 Device As directed for 0 days  Quantity: 1;  Refills: 3   Ordered :06-Oct-2010  Reginia Forts MD;  Started 06-Oct-2010 Active   HYOSCYAMINE SULFATE 0.375 MG TBCR    Take by mouth as needed.    IBUPROFEN (ADVIL,MOTRIN) 800 MG TABLET    Take by mouth every 6 (six) hours as needed.    L-METHYLFOLATE 7.5 MG TABS    TAKE 1 TABLET BY MOUTH DAILY   LOSARTAN (COZAAR) 50 MG TABLET    TAKE 1 TABLET BY MOUTH DAILY   MAGNESIUM GLUCONATE (MAGNESIUM 27 PO)    Take by mouth as needed.   MONTELUKAST (SINGULAIR) 10 MG TABLET    Take 10 mg by mouth at bedtime.   PROPRANOLOL (INDERAL) 10 MG TABLET    Take 1  tablet (10 mg total) by mouth 3 (three) times daily as needed.   VITAMIN C (ASCORBIC ACID) 500 MG TABLET    Take 500 mg by mouth daily.    Review of Systems  Constitutional: Negative.   HENT: Negative.   Eyes: Negative.   Respiratory: Negative.   Cardiovascular: Negative.   Endocrine: Negative.   Allergic/Immunologic: Negative.   Psychiatric/Behavioral: Negative.     Social History  Substance Use Topics  . Smoking status: Former Research scientist (life sciences)  . Smokeless tobacco: Never Used  . Alcohol Use: Yes     Comment: occas   Objective:   BP 122/72 mmHg  Temp(Src) 98.1 F (36.7 C)  Resp 16  Wt 151 lb (68.493 kg)  Physical Exam  Constitutional: She is oriented to person, place, and time. She appears well-developed and well-nourished.  HENT:  Head: Normocephalic and atraumatic.  Right Ear: External ear normal.  Left Ear: External ear normal.  Nose: Nose normal.  Eyes: Conjunctivae are normal.  Neck: Neck supple.  Cardiovascular: Normal rate, regular rhythm and normal heart sounds.   Pulmonary/Chest: Effort normal and breath sounds normal.  Abdominal: Soft.  Neurological: She is alert  and oriented to person, place, and time.  Skin: Skin is warm and dry.  Psychiatric: She has a normal mood and affect. Her behavior is normal. Judgment and thought content normal.        Assessment & Plan:     1. Traveler's diarrhea Treat only if she becomes symptomatic. - ciprofloxacin (CIPRO) 500 MG tablet; Take 1 tablet (500 mg total) by mouth 2 (two) times daily.  Dispense: 14 tablet; Refill: 0 - ondansetron (ZOFRAN-ODT) 8 MG disintegrating tablet; Take 1 tablet (8 mg total) by mouth every 8 (eight) hours as needed for nausea or vomiting.  Dispense: 35 tablet; Refill: 0 - azithromycin (ZITHROMAX) 250 MG tablet; Take 2 tablets by mouth today, then 1 tablet daily thereafter.  Dispense: 6 each; Refill: 0  2. Fear of flying  - ALPRAZolam (XANAX) 0.5 MG tablet; Take 1 tablet (0.5 mg total) by mouth as  needed for anxiety. Take 1-2 tables 1 hour before flight.  Dispense: 10 tablet; Refill: 0      I have done the exam and reviewed the above chart and it is accurate to the best of my knowledge.  Frankee Gritz Cranford Mon, MD  [facility]

## 2015-06-18 ENCOUNTER — Encounter: Payer: Self-pay | Admitting: Cardiovascular Disease

## 2015-06-21 ENCOUNTER — Encounter: Payer: Self-pay | Admitting: Obstetrics and Gynecology

## 2015-06-21 ENCOUNTER — Ambulatory Visit (INDEPENDENT_AMBULATORY_CARE_PROVIDER_SITE_OTHER): Payer: 59 | Admitting: Obstetrics and Gynecology

## 2015-06-21 VITALS — BP 116/72 | HR 76 | Ht 67.0 in | Wt 149.4 lb

## 2015-06-21 DIAGNOSIS — Z01419 Encounter for gynecological examination (general) (routine) without abnormal findings: Secondary | ICD-10-CM

## 2015-06-21 NOTE — Patient Instructions (Signed)
Place annual gynecologic exam patient instructions here.

## 2015-06-21 NOTE — Progress Notes (Signed)
Subjective:   Shelby [name] is a 45 y.o. G58P0 Caucasian female here for a routine well-woman exam.  No LMP recorded. Patient has had a hysterectomy.    Current complaints: none PCP: [name]       doesn't desire labs  Social History: Sexual: heterosexual Marital Status: separated Living situation: with sons Occupation: Warehouse manager at [employer] Tobacco/alcohol: no tobacco use Illicit drugs: no history of illicit drug use  The following portions of the patient's history were reviewed and updated as appropriate: allergies, current medications, past family history, past medical history, past social history, past surgical history and problem list.  Past Medical History Past Medical History  Diagnosis Date  . Anxiety   . Hypertension   . Family history of breast cancer   . Environmental allergies   . Painful menstrual periods   . Mitral valve disease     Past Surgical History Past Surgical History  Procedure Laterality Date  . Abdominal hysterectomy  2010    supracervical   . Tonsillectomy      Gynecologic History G2P0  No LMP recorded. Patient has had a hysterectomy. Contraception: abstinence Last Pap: 2016. Results were: normal Last mammogram: 2016. Results were: normal  Obstetric History OB History  Gravida Para Term Preterm AB SAB TAB Ectopic Multiple Living  2         2    # Outcome Date GA Lbr Len/2nd Weight Sex Delivery Anes PTL Lv  2 Gravida 2005    M Vag-Spont   Y  1 Gravida 1996    M Vag-Spont   Y      Current Medications Current Outpatient Prescriptions on File Prior to Visit  Medication Sig Dispense Refill  . albuterol (PROVENTIL HFA;VENTOLIN HFA) 108 (90 Base) MCG/ACT inhaler Inhale 2 puffs into the lungs every 6 (six) hours as needed for wheezing or shortness of breath. 1 Inhaler 12  . ALPRAZolam (XANAX) 0.5 MG tablet Take 1 tablet (0.5 mg total) by mouth as needed for anxiety. Take 1-2 tables 1 hour before flight. 10 tablet 0  . EPINEPHrine  0.3 mg/0.3 mL IJ SOAJ injection Inject 0.3 mLs (0.3 mg total) into the muscle once. 1 Device 12  . Hyoscyamine Sulfate 0.375 MG TBCR Take by mouth as needed.     Marland Kitchen ibuprofen (ADVIL,MOTRIN) 800 MG tablet Take by mouth every 6 (six) hours as needed.     Marland Kitchen L-Methylfolate 7.5 MG TABS TAKE 1 TABLET BY MOUTH DAILY 90 tablet 3  . losartan (COZAAR) 50 MG tablet TAKE 1 TABLET BY MOUTH DAILY 90 tablet 3  . Magnesium Gluconate (MAGNESIUM 27 PO) Take by mouth as needed.    . montelukast (SINGULAIR) 10 MG tablet Take 10 mg by mouth at bedtime.    . vitamin C (ASCORBIC ACID) 500 MG tablet Take 500 mg by mouth daily.    Marland Kitchen azithromycin (ZITHROMAX) 250 MG tablet 2 today and then one a day for 4 more days. (Patient not taking: Reported on 05/27/2015) 6 tablet 0  . azithromycin (ZITHROMAX) 250 MG tablet Take 2 tablets by mouth today, then 1 tablet daily thereafter. (Patient not taking: Reported on 06/21/2015) 6 each 0  . budesonide (RHINOCORT ALLERGY) 32 MCG/ACT nasal spray Place 2 sprays into both nostrils daily. Reported on 06/21/2015    . ciprofloxacin (CIPRO) 500 MG tablet Take 1 tablet (500 mg total) by mouth 2 (two) times daily. (Patient not taking: Reported on 06/21/2015) 14 tablet 0  . EPINEPHRINE, ANAPHYLAXIS THERAPY AGENTS, EPIPEN,  0.3MG /0.3ML (Injection Device)  1 Device As directed for 0 days  Quantity: 1;  Refills: 3   Ordered :06-Oct-2010  Reginia Forts MD;  Started 06-Oct-2010 Active    . ondansetron (ZOFRAN-ODT) 8 MG disintegrating tablet Take 1 tablet (8 mg total) by mouth every 8 (eight) hours as needed for nausea or vomiting. (Patient not taking: Reported on 06/21/2015) 35 tablet 0  . propranolol (INDERAL) 10 MG tablet Take 1 tablet (10 mg total) by mouth 3 (three) times daily as needed. (Patient not taking: Reported on 04/01/2015) 90 tablet 3   No current facility-administered medications on file prior to visit.    Review of Systems Patient denies any headaches, blurred vision, shortness of breath,  chest pain, abdominal pain, problems with bowel movements, urination, or intercourse.  Objective:  BP 116/72 mmHg  Pulse 76  Ht 5\' 7"  (1.702 m)  Wt 149 lb 6.4 oz (67.767 kg)  BMI 23.39 kg/m2 Physical Exam  General:  Well developed, well nourished, no acute distress. She is alert and oriented x3. Skin:  Warm and dry Neck:  Midline trachea, no thyromegaly or nodules Cardiovascular: Regular rate and rhythm, no murmur heard Lungs:  Effort normal, all lung fields clear to auscultation bilaterally Breasts:  No dominant palpable mass, retraction, or nipple discharge Abdomen:  Soft, non tender, no hepatosplenomegaly or masses Pelvic:  External genitalia is normal in appearance.  The vagina is normal in appearance. The cervix is bulbous, no CMT.  Thin prep pap is not done. Uterus is surgically absent.  No adnexal masses or tenderness noted. Extremities:  No swelling or varicosities noted Psych:  She has a normal mood and affect  Assessment:   Healthy well-woman exam S/P hysterectomy  Plan:   F/U 1 year for AE, or sooner if needed Mammogram scheduled   Oliva Montecalvo Rockney Ghee, CNM

## 2015-06-22 ENCOUNTER — Encounter: Payer: Self-pay | Admitting: Obstetrics and Gynecology

## 2015-06-29 ENCOUNTER — Telehealth: Payer: Self-pay | Admitting: Obstetrics and Gynecology

## 2015-06-29 ENCOUNTER — Other Ambulatory Visit: Payer: Self-pay | Admitting: *Deleted

## 2015-06-29 MED ORDER — FLUCONAZOLE 150 MG PO TABS: 150.0000 mg | ORAL_TABLET | Freq: Once | ORAL | Status: DC

## 2015-06-29 NOTE — Telephone Encounter (Signed)
Done-ac 

## 2015-06-29 NOTE — Telephone Encounter (Signed)
Pt was just here a week ago and now has a yeast infection/ itching, burning. She used monistat and it got a lil better. Today its aggrevating her again. She [facility] to [facility]

## 2015-07-19 DIAGNOSIS — H43813 Vitreous degeneration, bilateral: Secondary | ICD-10-CM | POA: Diagnosis not present

## 2015-07-30 ENCOUNTER — Ambulatory Visit
Admission: RE | Admit: 2015-07-30 | Discharge: 2015-07-30 | Disposition: A | Discharge status: Home / Self Care | Payer: 59 | Source: Ambulatory Visit | Attending: Family Medicine | Admitting: Family Medicine

## 2015-07-30 ENCOUNTER — Encounter: Payer: Self-pay | Admitting: Family Medicine

## 2015-07-30 ENCOUNTER — Ambulatory Visit (INDEPENDENT_AMBULATORY_CARE_PROVIDER_SITE_OTHER): Payer: 59 | Admitting: Family Medicine

## 2015-07-30 ENCOUNTER — Other Ambulatory Visit: Payer: Self-pay | Admitting: Family Medicine

## 2015-07-30 VITALS — BP 138/72 | Temp 98.6°F | Resp 16 | Wt 145.0 lb

## 2015-07-30 DIAGNOSIS — S92351A Displaced fracture of fifth metatarsal bone, right foot, initial encounter for closed fracture: Secondary | ICD-10-CM | POA: Diagnosis not present

## 2015-07-30 DIAGNOSIS — M79671 Pain in right foot: Secondary | ICD-10-CM

## 2015-07-30 DIAGNOSIS — X58XXXA Exposure to other specified factors, initial encounter: Secondary | ICD-10-CM | POA: Insufficient documentation

## 2015-07-30 DIAGNOSIS — S92354A Nondisplaced fracture of fifth metatarsal bone, right foot, initial encounter for closed fracture: Secondary | ICD-10-CM | POA: Insufficient documentation

## 2015-07-30 NOTE — Progress Notes (Signed)
[name] Female    DOB: Jun 19, 1970   45 y.o.   MRN: [id number] Visit Date: 07/30/2015  Today's Provider: Vernie Murders, PA   Chief Complaint  Patient presents with  . Foot Pain    since this morning.    Subjective:    Foot Pain This is a new problem. The current episode started today. The problem occurs constantly. The problem has been gradually worsening. Associated symptoms include arthralgias and myalgias. Pertinent negatives include no numbness or weakness. The symptoms are aggravated by walking. She has tried nothing for the symptoms.  Patient reports that she was walking her dogs this morning, and tripped over the pavement on her driveway at home. She has pain and swelling in her right foot. Patient has been wearing an ankle brace for support. Patient reports that the pain is worse when she bears weight on the right foot.   Past Medical History  Diagnosis Date  . Anxiety   . Hypertension   . Family history of breast cancer   . Environmental allergies   . Painful menstrual periods   . Mitral valve disease    Past Surgical History  Procedure Laterality Date  . Abdominal hysterectomy  2010    supracervical   . Tonsillectomy     Family History  Problem Relation Age of Onset  . Cancer Mother 57    breast  . Breast cancer Mother   . Diabetes Father   . Cancer Maternal Uncle     colon  . Cancer Maternal Grandmother 74    breast  . Breast cancer Maternal Grandmother    Allergies  Allergen Reactions  . Shellfish Allergy    Current Meds  Medication Sig  . albuterol (PROVENTIL HFA;VENTOLIN HFA) 108 (90 Base) MCG/ACT inhaler Inhale 2 puffs into the lungs every 6 (six) hours as needed for wheezing or shortness of breath.  . ALPRAZolam (XANAX) 0.5 MG tablet Take 1 tablet (0.5 mg total) by mouth as needed for anxiety. Take 1-2 tables 1 hour before flight.  . budesonide (RHINOCORT ALLERGY) 32 MCG/ACT nasal spray Place 2 sprays into both nostrils  daily. Reported on 06/21/2015  . EPINEPHrine 0.3 mg/0.3 mL IJ SOAJ injection Inject 0.3 mLs (0.3 mg total) into the muscle once.  Marland Kitchen EPINEPHRINE, ANAPHYLAXIS THERAPY AGENTS, EPIPEN, 0.3MG /0.3ML (Injection Device)  1 Device As directed for 0 days  Quantity: 1;  Refills: 3   Ordered :06-Oct-2010  Reginia Forts MD;  Started 06-Oct-2010 Active  . Hyoscyamine Sulfate 0.375 MG TBCR Take by mouth as needed.   Marland Kitchen ibuprofen (ADVIL,MOTRIN) 800 MG tablet Take by mouth every 6 (six) hours as needed.   Marland Kitchen L-Methylfolate 7.5 MG TABS TAKE 1 TABLET BY MOUTH DAILY  . losartan (COZAAR) 50 MG tablet TAKE 1 TABLET BY MOUTH DAILY  . Magnesium Gluconate (MAGNESIUM 27 PO) Take by mouth as needed.  . montelukast (SINGULAIR) 10 MG tablet Take 10 mg by mouth at bedtime.  . vitamin C (ASCORBIC ACID) 500 MG tablet Take 500 mg by mouth daily.    Review of Systems  Constitutional: Negative.   Musculoskeletal: Positive for myalgias, arthralgias and gait problem.       Has difficulty walking on right foot.   Skin: Negative.   Neurological: Negative for weakness and numbness.    Social History  Substance Use Topics  . Smoking status: Former Research scientist (life sciences)  . Smokeless tobacco: Never Used  . Alcohol Use: Yes     Comment:  occas   Objective:   BP 138/72 mmHg  Temp(Src) 98.6 F (37 C)  Resp 16  Wt 145 lb (65.772 kg)  Physical Exam  Constitutional: She is oriented to person, place, and time. She appears well-developed and well-nourished. No distress.  HENT:  Head: Normocephalic and atraumatic.  Right Ear: Hearing normal.  Left Ear: Hearing normal.  Nose: Nose normal.  Eyes: Conjunctivae and lids are normal. Right eye exhibits no discharge. Left eye exhibits no discharge. No scleral icterus.  Pulmonary/Chest: Effort normal. No respiratory distress.  Musculoskeletal: She exhibits tenderness.  Swelling over the right foot 5th tarsal-metatarsal area. Very tender to palpate or dorsiflex foot. Good pulses without neurologic  deficit.  Neurological: She is alert and oriented to person, place, and time.  Skin: Skin is intact. No lesion and no rash noted.  Psychiatric: She has a normal mood and affect. Her speech is normal and behavior is normal. Thought content normal.      Assessment & Plan:     1. Pain in right foot Stumbled on uneven concrete seam at home and twisted the right foot. Will get x-ray and recommend a walking boot cast. May use Ibuprofen 600-800 mg TID with food for pain. Elevate foot as much as possible. Recheck pending x-ray report. - DG Foot Complete Right      Vernie Murders, PA  [facility]

## 2015-07-30 NOTE — Addendum Note (Signed)
Addended by: Vernie Murders E on: 07/30/2015 02:44 PM   Modules accepted: Orders

## 2015-08-02 DIAGNOSIS — S92354A Nondisplaced fracture of fifth metatarsal bone, right foot, initial encounter for closed fracture: Secondary | ICD-10-CM | POA: Diagnosis not present

## 2015-08-30 ENCOUNTER — Other Ambulatory Visit: Payer: Self-pay | Admitting: Physician Assistant

## 2015-08-30 ENCOUNTER — Other Ambulatory Visit: Payer: Self-pay

## 2015-08-30 DIAGNOSIS — L821 Other seborrheic keratosis: Secondary | ICD-10-CM | POA: Diagnosis not present

## 2015-08-30 DIAGNOSIS — D1801 Hemangioma of skin and subcutaneous tissue: Secondary | ICD-10-CM | POA: Diagnosis not present

## 2015-08-30 DIAGNOSIS — L814 Other melanin hyperpigmentation: Secondary | ICD-10-CM | POA: Diagnosis not present

## 2015-08-30 DIAGNOSIS — L74510 Primary focal hyperhidrosis, axilla: Secondary | ICD-10-CM | POA: Diagnosis not present

## 2015-08-30 MED ORDER — MONTELUKAST SODIUM 10 MG PO TABS: 10.0000 mg | ORAL_TABLET | Freq: Every day | ORAL | 12 refills | Status: DC

## 2015-10-25 ENCOUNTER — Other Ambulatory Visit: Payer: Self-pay | Admitting: Family Medicine

## 2015-10-25 DIAGNOSIS — F40243 Fear of flying: Secondary | ICD-10-CM

## 2015-10-25 MED ORDER — ALPRAZOLAM 0.5 MG PO TABS: 0.5000 mg | ORAL_TABLET | ORAL | 0 refills | Status: DC | PRN

## 2015-10-25 NOTE — Telephone Encounter (Signed)
ok 

## 2015-10-25 NOTE — Telephone Encounter (Signed)
Pt states she is going on trip and flying.  Pt is requesting a Rx for ALPRAZolam (XANAX) 0.5 MG tablet for anxiety.  Pt is requesting 10 pills.  Pt will need to pick these up by Wednesday morning.   [facility].  CB#[phone]/MW

## 2015-10-25 NOTE — Telephone Encounter (Signed)
Please review-aa 

## 2015-10-25 NOTE — Telephone Encounter (Signed)
RX called in, updated in the chart and patient advised-aa

## 2015-11-18 ENCOUNTER — Telehealth: Payer: Self-pay | Admitting: Family Medicine

## 2015-11-18 NOTE — Telephone Encounter (Signed)
Pt stated that she had azithromycin (ZITHROMAX) 250 MG tablet from the last time she was in the office for possible sinus infection that she didn't take. Pt stated that she had a provider at the [facility] look at her and was advised that she had a sinus infection so she started taking the medication and now has symptoms of yeast infection and would like Diflucan called into [facility] before 12 pm tomorrow b/c she is leaving to go out of town. Pt was advised Dr. [name] is out of the office on Fridays and pt request if he couldn't get the request if another provider could call it in. Please advise. Thanks TNP

## 2015-11-18 NOTE — Telephone Encounter (Signed)
Please review-aa 

## 2015-11-20 NOTE — Telephone Encounter (Signed)
150mg  daily times 2

## 2015-11-22 NOTE — Telephone Encounter (Signed)
Called patient but patient needed this before the weekend, patient took care of it already, RX not sent in- -aa

## 2015-12-01 ENCOUNTER — Encounter: Payer: Self-pay | Admitting: Obstetrics and Gynecology

## 2016-02-07 ENCOUNTER — Ambulatory Visit (INDEPENDENT_AMBULATORY_CARE_PROVIDER_SITE_OTHER): Payer: 59 | Admitting: Physician Assistant

## 2016-02-07 VITALS — BP 116/78 | HR 76 | Temp 97.9°F | Resp 16 | Wt 146.0 lb

## 2016-02-07 DIAGNOSIS — J029 Acute pharyngitis, unspecified: Secondary | ICD-10-CM | POA: Diagnosis not present

## 2016-02-07 LAB — POCT RAPID STREP A (OFFICE): Rapid Strep A Screen: NEGATIVE

## 2016-02-07 MED ORDER — AMOXICILLIN 500 MG PO CAPS: 500.0000 mg | ORAL_CAPSULE | Freq: Two times a day (BID) | ORAL | 0 refills | Status: AC

## 2016-02-07 NOTE — Progress Notes (Signed)
Shelby [name]  Chief Complaint  Patient presents with  . URI    Subjective:    Patient ID: Shelby [name], female    DOB: 07/01/1970, 46 y.o.   MRN: [id number]  Upper Respiratory Infection: Shelby [name] is a 46 y.o. female complaining of symptoms of a URI. Symptoms include right ear pain, congestion and sore throat. Onset of symptoms was 3 days ago, unchanged since that time. 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 She also c/o lightheadedness and low grade fever for the past 1 day . No nausea/vomiting.  She is drinking plenty of fluids. Evaluation to date: none. Treatment to date: none. The treatment has provided minimal.   Review of Systems  Constitutional: Positive for chills and fatigue. Negative for activity change, appetite change, diaphoresis, fever and unexpected weight change.  HENT: Positive for congestion, ear pain (Right ear pain), postnasal drip, rhinorrhea and sore throat. Negative for ear discharge, sinus pain, sinus pressure and trouble swallowing.   Respiratory: Negative for apnea, cough, choking, chest tightness, shortness of breath, wheezing and stridor.   Gastrointestinal: Negative.   Musculoskeletal: Positive for myalgias.  Neurological: Positive for dizziness. Negative for light-headedness and headaches.       Objective:   BP 116/78 (BP Location: Right Arm, Patient Position: Sitting, Cuff Size: Normal)   Pulse 76   Temp 97.9 F (36.6 C) (Oral)   Resp 16   Wt 146 lb (66.2 kg)   BMI 22.87 kg/m   Patient Active Problem List   Diagnosis Date Noted  . Bronchitis 03/10/2015  . Palpitations 02/15/2015  . Anxiety 01/20/2015  . Clinical depression 01/20/2015  . Diverticulitis 01/20/2015  . BP (high blood pressure) 01/20/2015  . Adaptive colitis 01/20/2015  . Cannot sleep 01/20/2015  . Headache, variant migraine 01/20/2015  . Disorder of mitral valve 01/20/2015  . Herpes zona 01/20/2015  . Fibroid 05/07/2007  . Allergic  rhinitis 10/29/2006    Outpatient Encounter Prescriptions as of 02/07/2016  Medication Sig Note  . budesonide (RHINOCORT ALLERGY) 32 MCG/ACT nasal spray Place 2 sprays into both nostrils daily. Reported on 06/21/2015 03/29/2015: Possibly caused her throat irritation  . EPINEPHrine 0.3 mg/0.3 mL IJ SOAJ injection Inject 0.3 mLs (0.3 mg total) into the muscle once.   Marland Kitchen Hyoscyamine Sulfate 0.375 MG TBCR Take by mouth as needed.  01/20/2015: Medication taken as needed.  Received from: [facility]  . ibuprofen (ADVIL,MOTRIN) 800 MG tablet Take by mouth every 6 (six) hours as needed.  01/20/2015: Medication taken as needed.  Received from: [facility]  . L-Methylfolate 7.5 MG TABS TAKE 1 TABLET BY MOUTH DAILY   . losartan (COZAAR) 50 MG tablet TAKE 1 TABLET BY MOUTH DAILY   . Magnesium Gluconate (MAGNESIUM 27 PO) Take by mouth as needed.   . montelukast (SINGULAIR) 10 MG tablet Take 1 tablet (10 mg total) by mouth at bedtime.   . vitamin C (ASCORBIC ACID) 500 MG tablet Take 500 mg by mouth daily.   . [DISCONTINUED] ondansetron (ZOFRAN-ODT) 8 MG disintegrating tablet Take 1 tablet (8 mg total) by mouth every 8 (eight) hours as needed for nausea or vomiting.   . [DISCONTINUED] propranolol (INDERAL) 10 MG tablet Take 1 tablet (10 mg total) by mouth 3 (three) times daily as needed.   Marland Kitchen albuterol (PROVENTIL HFA;VENTOLIN HFA) 108 (90 Base) MCG/ACT inhaler Inhale 2 puffs into the lungs every 6 (six) hours as needed for wheezing or shortness of breath. (Patient not taking:  Reported on 02/07/2016)   . ALPRAZolam (XANAX) 0.5 MG tablet Take 1 tablet (0.5 mg total) by mouth as needed for anxiety. (Patient not taking: Reported on 02/07/2016)   . amoxicillin (AMOXIL) 500 MG capsule Take 1 capsule (500 mg total) by mouth 2 (two) times daily.   . [DISCONTINUED] EPINEPHRINE, ANAPHYLAXIS THERAPY AGENTS, EPIPEN, 0.3MG /0.3ML (Injection Device)  1 Device As directed for 0 days  Quantity: 1;   Refills: 3   Ordered :06-Oct-2010  Reginia Forts MD;  Started 06-Oct-2010 Active 01/20/2015: Received from: [facility]   No facility-administered encounter medications on file as of 02/07/2016.     Allergies  Allergen Reactions  . Shellfish Allergy        Physical Exam  Constitutional: She is oriented to person, place, and time. She appears well-developed and well-nourished.  HENT:  Nose: Right sinus exhibits no maxillary sinus tenderness and no frontal sinus tenderness. Left sinus exhibits no maxillary sinus tenderness and no frontal sinus tenderness.  Mouth/Throat: Mucous membranes are normal. Posterior oropharyngeal edema and posterior oropharyngeal erythema present. No oropharyngeal exudate.  Tms opaque bilaterally  Eyes: Conjunctivae are normal.  Neck: Neck supple.  Cardiovascular: Normal rate and regular rhythm.   Pulmonary/Chest: Effort normal and breath sounds normal.  Lymphadenopathy:    She has no cervical adenopathy.  Neurological: She is alert and oriented to person, place, and time.  Skin: Skin is warm and dry.  Psychiatric: She has a normal mood and affect. Her behavior is normal.       Assessment & Plan:   1. Sore throat  Do think it's part of a viral syndrome, especially considering her work. However, throat does look very edematous and red. Rapid strep negative, but will give abx prescription for patient to fill if her sore throat continues for longer than three more days.   - POCT rapid strep A - amoxicillin (AMOXIL) 500 MG capsule; Take 1 capsule (500 mg total) by mouth 2 (two) times daily.  Dispense: 20 capsule; Refill: 0   Return if symptoms worsen or fail to improve.  The entirety of the information documented in the History of Present Illness, Review of Systems and Physical Exam were personally obtained by me. Portions of this information were initially documented by Ashley Royalty, CMA and reviewed by me for thoroughness and accuracy.    Patient Instructions  Pharyngitis Pharyngitis is redness, pain, and swelling (inflammation) of your pharynx. What are the causes? Pharyngitis is usually caused by infection. Most of the time, these infections are from viruses (viral) and are part of a cold. However, sometimes pharyngitis is caused by bacteria (bacterial). Pharyngitis can also be caused by allergies. Viral pharyngitis may be spread from person to person by coughing, sneezing, and personal items or utensils (cups, forks, spoons, toothbrushes). Bacterial pharyngitis may be spread from person to person by more intimate contact, such as kissing. What are the signs or symptoms? Symptoms of pharyngitis include:  Sore throat.  Tiredness (fatigue).  Low-grade fever.  Headache.  Joint pain and muscle aches.  Skin rashes.  Swollen lymph nodes.  Plaque-like film on throat or tonsils (often seen with bacterial pharyngitis). How is this diagnosed? Your health care provider will ask you questions about your illness and your symptoms. Your medical history, along with a physical exam, is often all that is needed to diagnose pharyngitis. Sometimes, a rapid strep test is done. Other lab tests may also be done, depending on the suspected cause. How is this treated? Viral pharyngitis  will usually get better in 3-4 days without the use of medicine. Bacterial pharyngitis is treated with medicines that kill germs (antibiotics). Follow these instructions at home:  Drink enough water and fluids to keep your urine clear or pale yellow.  Only take over-the-counter or prescription medicines as directed by your health care provider:  If you are prescribed antibiotics, make sure you finish them even if you start to feel better.  Do not take aspirin.  Get lots of rest.  Gargle with 8 oz of salt water ( tsp of salt per 1 qt of water) as often as every 1-2 hours to soothe your throat.  Throat lozenges (if you are not at risk for choking)  or sprays may be used to soothe your throat. Contact a health care provider if:  You have large, tender lumps in your neck.  You have a rash.  You cough up green, yellow-brown, or bloody spit. Get help right away if:  Your neck becomes stiff.  You drool or are unable to swallow liquids.  You vomit or are unable to keep medicines or liquids down.  You have severe pain that does not go away with the use of recommended medicines.  You have trouble breathing (not caused by a stuffy nose). This information is not intended to replace advice given to you by your health care provider. Make sure you discuss any questions you have with your health care provider. Document Released: 01/16/2005 Document Revised: 06/24/2015 Document Reviewed: 09/23/2012 Elsevier Interactive Patient Education  2017 [employer].     The entirety of the information documented in the History of Present Illness, Review of Systems and Physical Exam were personally obtained by me. Portions of this information were initially documented by Ashley Royalty, CMA and reviewed by me for thoroughness and accuracy.

## 2016-02-07 NOTE — Patient Instructions (Signed)

## 2016-02-08 ENCOUNTER — Encounter: Payer: Self-pay | Admitting: Physician Assistant

## 2016-03-03 ENCOUNTER — Ambulatory Visit
Admission: RE | Admit: 2016-03-03 | Discharge: 2016-03-03 | Disposition: A | Discharge status: Home / Self Care | Payer: 59 | Source: Ambulatory Visit | Attending: Obstetrics and Gynecology | Admitting: Obstetrics and Gynecology

## 2016-03-03 ENCOUNTER — Ambulatory Visit: Payer: Self-pay | Admitting: Physician Assistant

## 2016-03-03 ENCOUNTER — Encounter: Payer: Self-pay | Admitting: Physician Assistant

## 2016-03-03 VITALS — BP 120/90 | HR 75 | Temp 98.4°F

## 2016-03-03 DIAGNOSIS — Z01419 Encounter for gynecological examination (general) (routine) without abnormal findings: Secondary | ICD-10-CM

## 2016-03-03 DIAGNOSIS — Z1231 Encounter for screening mammogram for malignant neoplasm of breast: Secondary | ICD-10-CM | POA: Insufficient documentation

## 2016-03-03 DIAGNOSIS — N390 Urinary tract infection, site not specified: Secondary | ICD-10-CM

## 2016-03-03 DIAGNOSIS — R300 Dysuria: Secondary | ICD-10-CM

## 2016-03-03 LAB — POCT URINALYSIS DIPSTICK
BILIRUBIN UA: NEGATIVE
GLUCOSE UA: NEGATIVE
Ketones, UA: NEGATIVE
Nitrite, UA: NEGATIVE
PH UA: 7
Protein, UA: NEGATIVE
RBC UA: NEGATIVE
SPEC GRAV UA: 1.015
Urobilinogen, UA: 0.2

## 2016-03-03 MED ORDER — CIPROFLOXACIN HCL 500 MG PO TABS: 500.0000 mg | ORAL_TABLET | Freq: Two times a day (BID) | ORAL | 0 refills | Status: DC

## 2016-03-03 NOTE — Progress Notes (Signed)
S:  C/o uti sx for 2 days,pressure, some pubic pain, frequency, when at end of urination feels like she needs to push,  denies vaginal discharge, abdominal pain or flank pain:  Remainder ros neg  O:  Vitals wnl, nad, no cva tenderness, back nontender, lungs c t a,cv rrr, abd soft nontender, bs normal, n/v intact, ua trace eluks  A: uti  P: cipro 500mg  bid x 3d, increase water intake, add cranberry juice, return if not improving in 2 -3 days, return earlier if worsening, discussed pyelonephritis sx

## 2016-04-03 DIAGNOSIS — H5213 Myopia, bilateral: Secondary | ICD-10-CM | POA: Diagnosis not present

## 2016-04-03 DIAGNOSIS — H524 Presbyopia: Secondary | ICD-10-CM | POA: Diagnosis not present

## 2016-04-03 DIAGNOSIS — H52223 Regular astigmatism, bilateral: Secondary | ICD-10-CM | POA: Diagnosis not present

## 2016-04-20 ENCOUNTER — Ambulatory Visit: Payer: Self-pay | Admitting: Physician Assistant

## 2016-04-20 ENCOUNTER — Encounter: Payer: Self-pay | Admitting: Physician Assistant

## 2016-04-20 VITALS — BP 120/80 | HR 67 | Temp 98.4°F

## 2016-04-20 DIAGNOSIS — H6981 Other specified disorders of Eustachian tube, right ear: Secondary | ICD-10-CM

## 2016-04-20 MED ORDER — AZITHROMYCIN 250 MG PO TABS
ORAL_TABLET | ORAL | 0 refills | Status: DC
Start: 2016-04-20 — End: 2016-06-21

## 2016-04-20 MED ORDER — PREDNISONE 10 MG PO TABS: 30.0000 mg | ORAL_TABLET | Freq: Every day | ORAL | 0 refills | Status: DC

## 2016-04-20 NOTE — Progress Notes (Signed)
S:  C/o ears pain on r side that radiates to the back of her throat, no drainage from ears, no fever/chills, no cough or congestion, some sinus pressure, remainder ros neg, also getting ready to go on a cruise and ?if she could get a zpack to travel with in case she gets sick Using otc meds without relief  O:  Vitals wnl, nad, tms dull b/l, nasal mucosa swollen, throat wnl, neck supple no lymph, lungs c t a, cv rrr, neuro intact  A: acute eustachean tube dysfunction  P: nasacort, sudafed, prednisone 30mg  qd x 3d, return if not improving in 3 to 5 days, return earlier if worsening, zpack for travel, take left over zofran and cipro also

## 2016-05-16 ENCOUNTER — Encounter: Payer: Self-pay | Admitting: Physician Assistant

## 2016-05-16 ENCOUNTER — Ambulatory Visit: Payer: Self-pay | Admitting: Physician Assistant

## 2016-05-16 VITALS — BP 110/75 | HR 69 | Temp 98.7°F

## 2016-05-16 DIAGNOSIS — N390 Urinary tract infection, site not specified: Secondary | ICD-10-CM

## 2016-05-16 MED ORDER — SULFAMETHOXAZOLE-TRIMETHOPRIM 800-160 MG PO TABS: 1.0000 | ORAL_TABLET | Freq: Two times a day (BID) | ORAL | 0 refills | Status: DC

## 2016-05-16 NOTE — Progress Notes (Signed)
S:  C/o uti sx for 2 days, lower abdominal pressure, denies vaginal discharge, abdominal pain or flank pain:  Remainder ros neg, states she is forgetting to go to the bathroom after sexual intercourse  O:  Vitals wnl, nad, no cva tenderness, back nontender, ua 1+ leuks  A: uti  P: septra bid x 7d, increase water intake, add cranberry juice, return if not improving in 2 -3 days, return earlier if worsening, discussed pyelonephritis sx, discussed measures to prevent utis

## 2016-05-25 LAB — POCT URINALYSIS DIPSTICK
Bilirubin, UA: NEGATIVE
Glucose, UA: NEGATIVE
Ketones, UA: NEGATIVE
NITRITE UA: NEGATIVE
PROTEIN UA: NEGATIVE
Spec Grav, UA: 1.015 (ref 1.010–1.025)
Urobilinogen, UA: 0.2 E.U./dL
pH, UA: 5.5 (ref 5.0–8.0)

## 2016-05-25 NOTE — Addendum Note (Signed)
Addended by: Vassie Loll D on: 05/25/2016 03:39 PM   Modules accepted: Orders

## 2016-05-29 ENCOUNTER — Other Ambulatory Visit: Payer: Self-pay | Admitting: Family Medicine

## 2016-06-06 ENCOUNTER — Telehealth: Payer: Self-pay | Admitting: Family Medicine

## 2016-06-06 DIAGNOSIS — F40243 Fear of flying: Secondary | ICD-10-CM

## 2016-06-06 MED ORDER — ALPRAZOLAM 0.5 MG PO TABS: 0.5000 mg | ORAL_TABLET | ORAL | 0 refills | Status: DC | PRN

## 2016-06-06 NOTE — Telephone Encounter (Signed)
°  Pt wants to know if you will call in a few generic xanax for her.  She is flying this weekend and you have done this before  [place]  Her call back Is [phone]  Thanks Con Memos

## 2016-06-06 NOTE — Telephone Encounter (Signed)
Please review-aa 

## 2016-06-06 NOTE — Telephone Encounter (Signed)
Xanax 0.5 mg q 4 hrs prn,#15,no rf.

## 2016-06-06 NOTE — Telephone Encounter (Signed)
rx called in and pt advised-aa

## 2016-06-21 ENCOUNTER — Encounter: Payer: Self-pay | Admitting: Obstetrics and Gynecology

## 2016-06-21 ENCOUNTER — Ambulatory Visit (INDEPENDENT_AMBULATORY_CARE_PROVIDER_SITE_OTHER): Payer: 59 | Admitting: Obstetrics and Gynecology

## 2016-06-21 ENCOUNTER — Other Ambulatory Visit: Payer: Self-pay | Admitting: Obstetrics and Gynecology

## 2016-06-21 VITALS — BP 122/82 | HR 73 | Ht 67.0 in | Wt 148.7 lb

## 2016-06-21 DIAGNOSIS — Z01419 Encounter for gynecological examination (general) (routine) without abnormal findings: Secondary | ICD-10-CM | POA: Diagnosis not present

## 2016-06-21 NOTE — Progress Notes (Signed)
Subjective:   Shelby [name] is a 46 y.o. G87P0 Caucasian female here for a routine well-woman exam.  No LMP recorded. Patient has had a hysterectomy.    Current complaints: right shoulder pain PCP: [name]       does desire labs  Social History: Sexual: heterosexual Marital Status: divorced Living situation: with family Occupation: unknown occupation Tobacco/alcohol: no tobacco use Illicit drugs: no history of illicit drug use  The following portions of the patient's history were reviewed and updated as appropriate: allergies, current medications, past family history, past medical history, past social history, past surgical history and problem list.  Past Medical History Past Medical History:  Diagnosis Date  . Anxiety   . Environmental allergies   . Family history of breast cancer   . Hypertension   . Mitral valve disease   . Painful menstrual periods     Past Surgical History Past Surgical History:  Procedure Laterality Date  . ABDOMINAL HYSTERECTOMY  2010   supracervical   . TONSILLECTOMY      Gynecologic History G2P0  No LMP recorded. Patient has had a hysterectomy. Contraception: tubal ligation Last Pap: 2016. Results were: normal Last mammogram: 03/2016. Results were: normal   Obstetric History OB History  Gravida Para Term Preterm AB Living  2         2  SAB TAB Ectopic Multiple Live Births          2    # Outcome Date GA Lbr Len/2nd Weight Sex Delivery Anes PTL Lv  2 Gravida 2005    M Vag-Spont   LIV  1 Gravida 1996    M Vag-Spont   LIV      Current Medications Current Outpatient Prescriptions on File Prior to Visit  Medication Sig Dispense Refill  . ALPRAZolam (XANAX) 0.5 MG tablet Take 1 tablet (0.5 mg total) by mouth every 4 (four) hours as needed for anxiety. 15 tablet 0  . budesonide (RHINOCORT ALLERGY) 32 MCG/ACT nasal spray Place 2 sprays into both nostrils daily. Reported on 06/21/2015    . EPINEPHrine 0.3 mg/0.3 mL IJ SOAJ injection  Inject 0.3 mLs (0.3 mg total) into the muscle once. 1 Device 12  . Hyoscyamine Sulfate 0.375 MG TBCR Take by mouth as needed.     Marland Kitchen ibuprofen (ADVIL,MOTRIN) 800 MG tablet Take by mouth every 6 (six) hours as needed.     Marland Kitchen L-Methylfolate 7.5 MG TABS TAKE 1 TABLET BY MOUTH DAILY 90 tablet 3  . losartan (COZAAR) 50 MG tablet TAKE 1 TABLET BY MOUTH DAILY 90 tablet 3  . montelukast (SINGULAIR) 10 MG tablet Take 1 tablet (10 mg total) by mouth at bedtime. 30 tablet 12  . albuterol (PROVENTIL HFA;VENTOLIN HFA) 108 (90 Base) MCG/ACT inhaler Inhale 2 puffs into the lungs every 6 (six) hours as needed for wheezing or shortness of breath. (Patient not taking: Reported on 06/21/2016) 1 Inhaler 12  . Magnesium Gluconate (MAGNESIUM 27 PO) Take by mouth as needed.    . sulfamethoxazole-trimethoprim (BACTRIM DS,SEPTRA DS) 800-160 MG tablet Take 1 tablet by mouth 2 (two) times daily. (Patient not taking: Reported on 06/21/2016) 14 tablet 0  . vitamin C (ASCORBIC ACID) 500 MG tablet Take 500 mg by mouth daily.     No current facility-administered medications on file prior to visit.     Review of Systems Patient denies any headaches, blurred vision, shortness of breath, chest pain, abdominal pain, problems with bowel movements, urination, or intercourse.  Objective:  BP  122/82   Pulse 73   Ht 5\' 7"  (1.702 m)   Wt 148 lb 11.2 oz (67.4 kg)   BMI 23.29 kg/m  Physical Exam  General:  Well developed, well nourished, no acute distress. She is alert and oriented x3. Skin:  Warm and dry Neck:  Midline trachea, no thyromegaly or nodules Cardiovascular: Regular rate and rhythm, no murmur heard Lungs:  Effort normal, all lung fields clear to auscultation bilaterally Breasts:  No dominant palpable mass, retraction, or nipple discharge Abdomen:  Soft, non tender, no hepatosplenomegaly or masses Pelvic:  External genitalia is normal in appearance.  The vagina is normal in appearance. The cervix is bulbous, no CMT.   Thin prep pap is done with HR HPV cotesting. Uterus is felt to be normal size, shape, and contour.  No adnexal masses or tenderness noted. Extremities:  No swelling or varicosities noted Psych:  She has a normal mood and affect  Assessment:   Healthy well-woman exam  Plan:   F/U 1 year for AE, or sooner if needed   [name] Rockney Ghee, CNM

## 2016-06-22 ENCOUNTER — Encounter: Payer: Self-pay | Admitting: Obstetrics and Gynecology

## 2016-06-22 LAB — COMPREHENSIVE METABOLIC PANEL
A/G RATIO: 2 (ref 1.2–2.2)
ALBUMIN: 4.7 g/dL (ref 3.5–5.5)
ALT: 14 IU/L (ref 0–32)
AST: 19 IU/L (ref 0–40)
Alkaline Phosphatase: 47 IU/L (ref 39–117)
BILIRUBIN TOTAL: 0.4 mg/dL (ref 0.0–1.2)
BUN / CREAT RATIO: 27 — AB (ref 9–23)
BUN: 19 mg/dL (ref 6–24)
CHLORIDE: 98 mmol/L (ref 96–106)
CO2: 27 mmol/L (ref 18–29)
CREATININE: 0.71 mg/dL (ref 0.57–1.00)
Calcium: 10 mg/dL (ref 8.7–10.2)
GFR calc Af Amer: 116 mL/min/{1.73_m2} (ref >59)
GFR calc non Af Amer: 101 mL/min/{1.73_m2} (ref >59)
GLOBULIN, TOTAL: 2.4 g/dL (ref 1.5–4.5)
Glucose: 97 mg/dL (ref 65–99)
Potassium: 4 mmol/L (ref 3.5–5.2)
SODIUM: 140 mmol/L (ref 134–144)
Total Protein: 7.1 g/dL (ref 6.0–8.5)

## 2016-06-22 LAB — LIPID PANEL
CHOLESTEROL TOTAL: 208 mg/dL — AB (ref 100–199)
Chol/HDL Ratio: 2 ratio (ref 0.0–4.4)
HDL: 105 mg/dL (ref >39)
LDL Calculated: 76 mg/dL (ref 0–99)
TRIGLYCERIDES: 137 mg/dL (ref 0–149)
VLDL CHOLESTEROL CAL: 27 mg/dL (ref 5–40)

## 2016-06-22 LAB — HEMOGLOBIN A1C
Est. average glucose Bld gHb Est-mCnc: 105 mg/dL
Hgb A1c MFr Bld: 5.3 % (ref 4.8–5.6)

## 2016-06-23 LAB — CYTOLOGY - PAP

## 2016-08-18 DIAGNOSIS — D2339 Other benign neoplasm of skin of other parts of face: Secondary | ICD-10-CM | POA: Diagnosis not present

## 2016-08-18 DIAGNOSIS — L821 Other seborrheic keratosis: Secondary | ICD-10-CM | POA: Diagnosis not present

## 2016-08-18 DIAGNOSIS — L280 Lichen simplex chronicus: Secondary | ICD-10-CM | POA: Diagnosis not present

## 2016-08-28 ENCOUNTER — Other Ambulatory Visit: Payer: Self-pay | Admitting: Family Medicine

## 2016-12-26 ENCOUNTER — Encounter: Payer: Self-pay | Admitting: Physician Assistant

## 2016-12-26 ENCOUNTER — Ambulatory Visit: Payer: Self-pay | Admitting: Physician Assistant

## 2016-12-26 VITALS — BP 100/88 | HR 68 | Temp 98.5°F

## 2016-12-26 DIAGNOSIS — L089 Local infection of the skin and subcutaneous tissue, unspecified: Secondary | ICD-10-CM

## 2016-12-26 DIAGNOSIS — A4902 Methicillin resistant Staphylococcus aureus infection, unspecified site: Secondary | ICD-10-CM

## 2016-12-26 NOTE — Progress Notes (Signed)
   Subjective:    Patient ID: Shelby [name], female    DOB: 07-14-1970, 46 y.o.   MRN: [id number]  HPI Patient complaining of sore in the right nostril for one half months. Onset of complaint after traveling in the [place] for 2 weeks. Patient denies epistaxis. Patient state burning pain with postnasal drainage. Patient stated no relief with over-the-counter antibacterial creams.   Review of Systems Depression and hypertension    Objective:   Physical Exam HEENT is remarkable for internal edematous erythematous right nostril. Ulcerative lesion superior aspect of right nostril.      Assessment & Plan: Bacterial nasal infection   Patient given discharge Instructions and a prescription for Bactroban nasal ointment. Advised return back to this department is no improvement in 5-7 days.

## 2016-12-28 ENCOUNTER — Encounter: Payer: Self-pay | Admitting: Family

## 2016-12-28 ENCOUNTER — Ambulatory Visit: Payer: Self-pay | Admitting: Family

## 2016-12-28 VITALS — BP 130/90 | HR 71 | Temp 98.0°F

## 2016-12-28 DIAGNOSIS — J340 Abscess, furuncle and carbuncle of nose: Secondary | ICD-10-CM

## 2016-12-28 MED ORDER — SULFAMETHOXAZOLE-TRIMETHOPRIM 800-160 MG PO TABS: 1.0000 | ORAL_TABLET | Freq: Two times a day (BID) | ORAL | 0 refills | Status: DC

## 2016-12-28 NOTE — Progress Notes (Signed)
  S/Andra returns with concerns. The right nostril lesion is less painful but she has what sounds like sinus symptoms; she has pressure of her upper teeth and eyes, she has an intermittent headache with photosensitivity and slight nausea. She denies fever , other ENT , respiratory or neurological symptoms.  O/alert pleasant NAD VSS  ENT :TMs are clear nasal, mucosa swollen right nare  is nonpatent, the lesion is poorly visualized, throat clear neck supple without adenopathy   A/acute rhinosinusitis, migraine   P/continue supportive measures. Rx Septra DS 1 by mouth twice a day 10 days #20 no refills. She is to follow-up in 2-3 days when necessary not improving. She is also encouraged to use nasal saline several times a day.If headaches persist eval for rx migraine.

## 2017-02-27 ENCOUNTER — Ambulatory Visit (INDEPENDENT_AMBULATORY_CARE_PROVIDER_SITE_OTHER): Payer: No Typology Code available for payment source | Admitting: Family Medicine

## 2017-02-27 VITALS — BP 110/76 | HR 73 | Temp 98.4°F | Resp 16

## 2017-02-27 DIAGNOSIS — M7541 Impingement syndrome of right shoulder: Secondary | ICD-10-CM | POA: Diagnosis not present

## 2017-02-27 DIAGNOSIS — J0181 Other acute recurrent sinusitis: Secondary | ICD-10-CM | POA: Diagnosis not present

## 2017-02-27 NOTE — Patient Instructions (Signed)
Try Milta Deiters sinus rinses.

## 2017-02-27 NOTE — Progress Notes (Signed)
Shelby [name]  MRN: [id number] DOB: April 19, 1970  Subjective:  HPI  Patient is here to discuss 2 issues: 1) Right shoulder pain. In 2016 she injured her shoulder-she climbed up on file cabinet in the closet to try to find a paper she was looking for to help her son with his homework and when she went to get down off the cabinet she was still holding on to the bars on top of the closet and then jumped off backwards and when she did she jerked both arms and felt a pain in right shoulder. Ever since then has had pain and decreased range of motion with that arm, some catching and popping present in the joint now. She has not had any xrays for this or MRI. She works at [employer] and an orthopedic specialist that comes there to help looked and evaluated her and she had [place] of that shoulder done at physical therapy and was told it was a torn muscle and it will take a while to heal. She is just wanting to go ahead and get this looked at thorough.  2) patient went to [place] in October and when she came back she had nasal dryness and developed a sore/lesion in the inside of her nose on the right. She waited a month and it was not healing so [facility] and was told it was staph infection and was treated with Bactroban nasal ointment. Then after that she started to have sinus issues and was een on 12/28/16 at [facility] and was treated for sinusitis with Septra DS. Patient states after getting both treatments done she felt better. This month January started to get same symptoms of sinuses again, nasal congestion, dryness, headache, post nasal drip constantly. No fever. And has felt a sore spot inside of right nostril again and just wants to have this looked at.  Patient Active Problem List   Diagnosis Date Noted  . Bronchitis 03/10/2015  . Palpitations 02/15/2015  . Anxiety 01/20/2015  . Clinical depression 01/20/2015  . Diverticulitis 01/20/2015  . BP (high blood pressure)  01/20/2015  . Adaptive colitis 01/20/2015  . Cannot sleep 01/20/2015  . Headache, variant migraine 01/20/2015  . Disorder of mitral valve 01/20/2015  . Herpes zona 01/20/2015  . Fibroid 05/07/2007  . Allergic rhinitis 10/29/2006    Past Medical History:  Diagnosis Date  . Anxiety   . Environmental allergies   . Family history of breast cancer   . Hypertension   . Mitral valve disease   . Painful menstrual periods     Social History   Socioeconomic History  . Marital status: Married    Spouse name: Not on file  . Number of children: Not on file  . Years of education: Not on file  . Highest education level: Not on file  Social Needs  . Financial resource strain: Not on file  . Food insecurity - worry: Not on file  . Food insecurity - inability: Not on file  . Transportation needs - medical: Not on file  . Transportation needs - non-medical: Not on file  Occupational History  . Not on file  Tobacco Use  . Smoking status: Former Research scientist (life sciences)  . Smokeless tobacco: Never Used  Substance and Sexual Activity  . Alcohol use: Yes    Comment: occas  . Drug use: No  . Sexual activity: Yes  Other Topics Concern  . Not on file  Social History Narrative  . Not  on file    Outpatient Encounter Medications as of 02/27/2017  Medication Sig Note  . albuterol (PROVENTIL HFA;VENTOLIN HFA) 108 (90 Base) MCG/ACT inhaler Inhale 2 puffs into the lungs every 6 (six) hours as needed for wheezing or shortness of breath.   . ALPRAZolam (XANAX) 0.5 MG tablet Take 1 tablet (0.5 mg total) by mouth every 4 (four) hours as needed for anxiety.   . budesonide (RHINOCORT ALLERGY) 32 MCG/ACT nasal spray Place 2 sprays into both nostrils daily. Reported on 06/21/2015 03/29/2015: Possibly caused her throat irritation  . EPINEPHrine 0.3 mg/0.3 mL IJ SOAJ injection Inject 0.3 mLs (0.3 mg total) into the muscle once.   Marland Kitchen Hyoscyamine Sulfate 0.375 MG TBCR Take by mouth as needed.  01/20/2015: Medication taken as  needed.  Received from: [facility]  . ibuprofen (ADVIL,MOTRIN) 800 MG tablet Take by mouth every 6 (six) hours as needed.  01/20/2015: Medication taken as needed.  Received from: [facility]  . L-Methylfolate 7.5 MG TABS TAKE 1 TABLET BY MOUTH DAILY   . losartan (COZAAR) 50 MG tablet TAKE 1 TABLET BY MOUTH DAILY   . Magnesium Gluconate (MAGNESIUM 27 PO) Take by mouth as needed.   . montelukast (SINGULAIR) 10 MG tablet Take 1 tablet (10 mg total) by mouth at bedtime.   . vitamin C (ASCORBIC ACID) 500 MG tablet Take 500 mg by mouth daily.   . [DISCONTINUED] sulfamethoxazole-trimethoprim (BACTRIM DS,SEPTRA DS) 800-160 MG tablet Take 1 tablet by mouth 2 (two) times daily.    No facility-administered encounter medications on file as of 02/27/2017.     Allergies  Allergen Reactions  . Hydrocodone-Acetaminophen Hives    swollen face  . Shellfish Allergy     Review of Systems  Constitutional: Negative.   HENT: Positive for congestion.   Eyes: Negative.   Respiratory: Negative.   Cardiovascular: Negative.   Gastrointestinal: Negative.   Musculoskeletal: Positive for joint pain.  Skin: Negative.        Sore on the inside of right nostril  Neurological: Negative.   Endo/Heme/Allergies: Negative.   Psychiatric/Behavioral: Negative.     Objective:  BP 110/76   Pulse 73   Temp 98.4 F (36.9 C)   Resp 16   Physical Exam  Constitutional: She is oriented to person, place, and time and well-developed, well-nourished, and in no distress.  HENT:  Head: Normocephalic and atraumatic.  Right Ear: External ear normal.  Left Ear: External ear normal.  Nose: Nose normal.  Eyes: Conjunctivae are normal. No scleral icterus.  Neck: No thyromegaly present.  Cardiovascular: Normal rate, regular rhythm and normal heart sounds.  Pulmonary/Chest: Effort normal and breath sounds normal.  Abdominal: Soft.  Musculoskeletal:  Pain with abduction of right shoulder  above 90 degrees.  Neurological: She is alert and oriented to person, place, and time. Gait normal. GCS score is 15.  Skin: Skin is warm and dry.  Psychiatric: Mood, memory, affect and judgment normal.    Assessment and Plan :  1. Impingement syndrome of right shoulder Refer to Dr Kallie Locks Dr Laney [place]ash depending on insurance. - Ambulatory referral to Orthopedic Surgery  2. Other acute recurrent sinusitis Try [place] sinus rinse.Normal exam. Refer to ENT if any persistant symptoms.  HPI, Exam and A&P transcribed by Tiffany Kocher, RMA under direction and in the presence of Miguel Aschoff, MD. I have done the exam and reviewed the chart and it is accurate to the best of my knowledge. Development worker, community has been used and  any errors in dictation or transcription are unintentional. Miguel Aschoff M.D. [facility]

## 2017-03-14 ENCOUNTER — Other Ambulatory Visit: Payer: Self-pay | Admitting: Orthopedic Surgery

## 2017-03-14 DIAGNOSIS — M25511 Pain in right shoulder: Secondary | ICD-10-CM

## 2017-03-27 ENCOUNTER — Ambulatory Visit: Payer: Self-pay

## 2017-03-30 ENCOUNTER — Ambulatory Visit
Admission: RE | Admit: 2017-03-30 | Discharge: 2017-03-30 | Disposition: A | Discharge status: Home / Self Care | Payer: No Typology Code available for payment source | Source: Ambulatory Visit | Attending: Orthopedic Surgery | Admitting: Orthopedic Surgery

## 2017-03-30 DIAGNOSIS — M75101 Unspecified rotator cuff tear or rupture of right shoulder, not specified as traumatic: Secondary | ICD-10-CM | POA: Diagnosis not present

## 2017-03-30 DIAGNOSIS — M25511 Pain in right shoulder: Secondary | ICD-10-CM

## 2017-03-30 DIAGNOSIS — M659 Synovitis and tenosynovitis, unspecified: Secondary | ICD-10-CM | POA: Insufficient documentation

## 2017-04-09 ENCOUNTER — Telehealth: Payer: Self-pay | Admitting: Family Medicine

## 2017-04-09 NOTE — Telephone Encounter (Signed)
Received Medical Clearance form from [facility]. Form placed in providers box. Thanks CC

## 2017-04-09 NOTE — Telephone Encounter (Signed)
Patient scheduled for clearance and form is on my desk

## 2017-04-10 ENCOUNTER — Other Ambulatory Visit: Payer: Self-pay | Admitting: Orthopedic Surgery

## 2017-04-12 ENCOUNTER — Ambulatory Visit (INDEPENDENT_AMBULATORY_CARE_PROVIDER_SITE_OTHER): Payer: No Typology Code available for payment source | Admitting: Family Medicine

## 2017-04-12 ENCOUNTER — Encounter: Payer: Self-pay | Admitting: Family Medicine

## 2017-04-12 VITALS — BP 124/76 | HR 80 | Temp 97.8°F | Resp 16 | Wt 149.0 lb

## 2017-04-12 DIAGNOSIS — M25511 Pain in right shoulder: Secondary | ICD-10-CM | POA: Diagnosis not present

## 2017-04-12 DIAGNOSIS — I059 Rheumatic mitral valve disease, unspecified: Secondary | ICD-10-CM

## 2017-04-12 DIAGNOSIS — G8929 Other chronic pain: Secondary | ICD-10-CM

## 2017-04-12 DIAGNOSIS — G43809 Other migraine, not intractable, without status migrainosus: Secondary | ICD-10-CM

## 2017-04-12 NOTE — Progress Notes (Signed)
[name] Female    DOB: Dec 31, 1970   47 y.o.   MRN: [id number] Visit Date: 04/12/2017  Today's Provider: Wilhemena Durie, MD   Chief Complaint  Patient presents with  . Medical Clearance   Subjective:    HPI   Pt coming in today for a surgical clearance.  She is having right rotator cuff surgery 04/26/2017.         Allergies  Allergen Reactions  . Hydrocodone-Acetaminophen Hives    swollen face  . Shellfish Allergy      Current Outpatient Medications:  .  ALPRAZolam (XANAX) 0.5 MG tablet, Take 1 tablet (0.5 mg total) by mouth every 4 (four) hours as needed for anxiety., Disp: 15 tablet, Rfl: 0 .  budesonide (RHINOCORT ALLERGY) 32 MCG/ACT nasal spray, Place 2 sprays into both nostrils daily. Reported on 06/21/2015, Disp: , Rfl:  .  EPINEPHrine 0.3 mg/0.3 mL IJ SOAJ injection, Inject 0.3 mLs (0.3 mg total) into the muscle once., Disp: 1 Device, Rfl: 12 .  Ergocalciferol (VITAMIN D2) 400 units TABS, Take by mouth., Disp: , Rfl:  .  Hyoscyamine Sulfate 0.375 MG TBCR, Take by mouth as needed. , Disp: , Rfl:  .  ibuprofen (ADVIL,MOTRIN) 800 MG tablet, Take by mouth every 6 (six) hours as needed. , Disp: , Rfl:  .  L-Methylfolate 7.5 MG TABS, TAKE 1 TABLET BY MOUTH DAILY, Disp: 90 tablet, Rfl: 3 .  losartan (COZAAR) 50 MG tablet, TAKE 1 TABLET BY MOUTH DAILY, Disp: 90 tablet, Rfl: 3 .  Magnesium Gluconate (MAGNESIUM 27 PO), Take by mouth as needed., Disp: , Rfl:  .  montelukast (SINGULAIR) 10 MG tablet, Take 1 tablet (10 mg total) by mouth at bedtime., Disp: 30 tablet, Rfl: 12 .  vitamin C (ASCORBIC ACID) 500 MG tablet, Take 500 mg by mouth daily., Disp: , Rfl:  .  albuterol (PROVENTIL HFA;VENTOLIN HFA) 108 (90 Base) MCG/ACT inhaler, Inhale 2 puffs into the lungs every 6 (six) hours as needed for wheezing or shortness of breath. (Patient not taking: Reported on 04/12/2017), Disp: 1 Inhaler, Rfl: 12  Review of Systems  Constitutional: Negative.     Respiratory: Negative.   Cardiovascular: Negative.   Endocrine: Negative.   Allergic/Immunologic: Negative.   Neurological: Negative.   Psychiatric/Behavioral: Negative.     Social History   Tobacco Use  . Smoking status: Former Research scientist (life sciences)  . Smokeless tobacco: Never Used  Substance Use Topics  . Alcohol use: Yes    Comment: occas   Objective:   BP 124/76 (BP Location: Right Arm, Patient Position: Sitting, Cuff Size: Normal)   Pulse 80   Temp 97.8 F (36.6 C) (Oral)   Resp 16   Wt 149 lb (67.6 kg)   BMI 23.34 kg/m  Vitals:   04/12/17 0811  BP: 124/76  Pulse: 80  Resp: 16  Temp: 97.8 F (36.6 C)  TempSrc: Oral  Weight: 149 lb (67.6 kg)     Physical Exam  Constitutional: She is oriented to person, place, and time. She appears well-developed and well-nourished.  HENT:  Head: Normocephalic and atraumatic.  Right Ear: External ear normal.  Left Ear: External ear normal.  Nose: Nose normal.  Mouth/Throat: Oropharynx is clear and moist.  Eyes: Conjunctivae are normal. No scleral icterus.  Neck: No thyromegaly present.  Cardiovascular: Normal rate, regular rhythm and normal heart sounds.  Pulmonary/Chest: Effort normal and breath sounds normal.  Abdominal: Soft.  Neurological: She is alert and  oriented to person, place, and time.  Skin: Skin is warm and dry.  Psychiatric: She has a normal mood and affect. Her behavior is normal. Judgment and thought content normal.        Assessment & Plan:     1. Disorder of mitral valve - EKG 12-Lead 2.Shoulder Arthropathy Pt medically cleared for surgery. 3.HTN      I have done the exam and reviewed the above chart and it is accurate to the best of my knowledge. Development worker, community has been used in this note in any air is in the dictation or transcription are unintentional.  Wilhemena Durie, MD  [place]

## 2017-04-12 NOTE — Telephone Encounter (Signed)
Provider has papers and patient was seen on 04/12/17

## 2017-04-19 ENCOUNTER — Encounter
Admission: RE | Admit: 2017-04-19 | Discharge: 2017-04-19 | Disposition: A | Discharge status: Home / Self Care | Payer: No Typology Code available for payment source | Source: Ambulatory Visit | Attending: Orthopedic Surgery | Admitting: Orthopedic Surgery

## 2017-04-19 ENCOUNTER — Encounter: Payer: Self-pay | Admitting: *Deleted

## 2017-04-19 ENCOUNTER — Other Ambulatory Visit: Payer: Self-pay

## 2017-04-19 NOTE — Patient Instructions (Signed)
Your procedure is scheduled on: 04-26-17 THURSDAY Report to Same Day Surgery 2nd floor medical mall [facility] Entrance-take elevator on left to 2nd floor.  Check in with surgery information desk.) To find out your arrival time please call [phone] between 1PM - 3PM on 04-25-17 [facility]  Remember: Instructions that are not followed completely may result in serious medical risk, up to and including death, or upon the discretion of your surgeon and anesthesiologist your surgery may need to be rescheduled.    _x___ 1. Do not eat food after midnight the night before your procedure. NO GUM OR CANDY AFTER MIDNIGHT.  You may drink clear liquids up to 2 hours before you are scheduled to arrive at the hospital for your procedure.  Do not drink clear liquids within 2 hours of your scheduled arrival to the hospital.  Clear liquids include  --Water or Apple juice without pulp  --Clear carbohydrate beverage such as ClearFast or Gatorade  --Black Coffee or Clear Tea (No milk, no creamers, do not add anything to  the coffee or Tea    __x__ 2. No Alcohol for 24 hours before or after surgery.   __x__3. No Smoking or e-cigarettes for 24 prior to surgery.  Do not use any chewable tobacco products for at least 6 hour prior to surgery   ____  4. Bring all medications with you on the day of surgery if instructed.    __x__ 5. Notify your doctor if there is any change in your medical condition     (cold, fever, infections).    x___6. On the morning of surgery brush your teeth with toothpaste and water.  You may rinse your mouth with mouth wash if you wish.  Do not swallow any toothpaste or mouthwash.   Do not wear jewelry, make-up, hairpins, clips or nail polish.  Do not wear lotions, powders, or perfumes. You may wear deodorant.  Do not shave 48 hours prior to surgery. Men may shave face and neck.  Do not bring valuables to the hospital.    [facility] is not responsible for any belongings or  valuables.               Contacts, dentures or bridgework may not be worn into surgery.  Leave your suitcase in the car. After surgery it may be brought to your room.  For patients admitted to the hospital, discharge time is determined by your treatment team.  _  Patients discharged the day of surgery will not be allowed to drive home.  You will need someone to drive you home and stay with you the night of your procedure.   _x___ TAKE THE FOLLOWING MEDICATION THE MORNING OF SURGERY WITH A SMALL SIP OF WATER. These include:  1. ZANTAC  2. TAKE A ZANTAC Wednesday NIGHT BEFORE BED  3. YOU MAY TAKE A XANAX IF NEEDED AM OF SURGERY WITH A SMALL SIP OF WATER  4.  5.  6.  ____Fleets enema or Magnesium Citrate as directed.   _x___ Use CHG Soap or sage wipes as directed on instruction sheet   ____ Use inhalers on the day of surgery and bring to hospital day of surgery  ____ Stop Metformin and Janumet 2 days prior to surgery.    ____ Take 1/2 of usual insulin dose the night before surgery and none on the morning surgery.   ____ Follow recommendations from Cardiologist, Pulmonologist or PCP regarding stopping Aspirin, Coumadin, Plavix ,Eliquis, Effient, or Pradaxa, and Pletal.  X____Stop Anti-inflammatories such as Advil, Aleve, IBUPROFEN, Motrin, Naproxen, Naprosyn, Goodies powders or aspirin products. OK to take Tylenol    ____ Stop supplements until after surgery.   ____ Bring C-Pap to the hospital.

## 2017-04-23 ENCOUNTER — Encounter
Admission: RE | Admit: 2017-04-23 | Discharge: 2017-04-23 | Disposition: A | Discharge status: Home / Self Care | Payer: No Typology Code available for payment source | Source: Ambulatory Visit | Attending: Orthopedic Surgery | Admitting: Orthopedic Surgery

## 2017-04-23 DIAGNOSIS — K219 Gastro-esophageal reflux disease without esophagitis: Secondary | ICD-10-CM | POA: Diagnosis not present

## 2017-04-23 DIAGNOSIS — Z01812 Encounter for preprocedural laboratory examination: Secondary | ICD-10-CM | POA: Insufficient documentation

## 2017-04-23 DIAGNOSIS — M7501 Adhesive capsulitis of right shoulder: Secondary | ICD-10-CM | POA: Diagnosis not present

## 2017-04-23 DIAGNOSIS — F419 Anxiety disorder, unspecified: Secondary | ICD-10-CM | POA: Diagnosis not present

## 2017-04-23 DIAGNOSIS — Z87891 Personal history of nicotine dependence: Secondary | ICD-10-CM | POA: Diagnosis not present

## 2017-04-23 DIAGNOSIS — I10 Essential (primary) hypertension: Secondary | ICD-10-CM | POA: Diagnosis not present

## 2017-04-23 DIAGNOSIS — D649 Anemia, unspecified: Secondary | ICD-10-CM | POA: Diagnosis not present

## 2017-04-23 DIAGNOSIS — M75121 Complete rotator cuff tear or rupture of right shoulder, not specified as traumatic: Secondary | ICD-10-CM | POA: Diagnosis not present

## 2017-04-23 DIAGNOSIS — Z79899 Other long term (current) drug therapy: Secondary | ICD-10-CM | POA: Diagnosis not present

## 2017-04-23 DIAGNOSIS — F329 Major depressive disorder, single episode, unspecified: Secondary | ICD-10-CM | POA: Diagnosis not present

## 2017-04-23 HISTORY — DX: Dizziness and giddiness: R42

## 2017-04-23 LAB — CBC WITH DIFFERENTIAL/PLATELET
Basophils Absolute: 0 10*3/uL (ref 0–0.1)
Basophils Relative: 1 %
EOS PCT: 4 %
Eosinophils Absolute: 0.2 10*3/uL (ref 0–0.7)
HEMATOCRIT: 38.2 % (ref 35.0–47.0)
HEMOGLOBIN: 12.8 g/dL (ref 12.0–16.0)
LYMPHS ABS: 1.6 10*3/uL (ref 1.0–3.6)
LYMPHS PCT: 33 %
MCH: 30.8 pg (ref 26.0–34.0)
MCHC: 33.4 g/dL (ref 32.0–36.0)
MCV: 92.1 fL (ref 80.0–100.0)
MONOS PCT: 8 %
Monocytes Absolute: 0.4 10*3/uL (ref 0.2–0.9)
NEUTROS ABS: 2.6 10*3/uL (ref 1.4–6.5)
Neutrophils Relative %: 54 %
Platelets: 225 10*3/uL (ref 150–440)
RBC: 4.15 MIL/uL (ref 3.80–5.20)
RDW: 12.9 % (ref 11.5–14.5)
WBC: 4.8 10*3/uL (ref 3.6–11.0)

## 2017-04-23 LAB — BASIC METABOLIC PANEL
ANION GAP: 10 (ref 5–15)
BUN: 26 mg/dL — ABNORMAL HIGH (ref 6–20)
CHLORIDE: 102 mmol/L (ref 101–111)
CO2: 25 mmol/L (ref 22–32)
Calcium: 9.5 mg/dL (ref 8.9–10.3)
Creatinine, Ser: 0.73 mg/dL (ref 0.44–1.00)
GFR calc Af Amer: >60 mL/min (ref >60)
GFR calc non Af Amer: >60 mL/min (ref >60)
GLUCOSE: 103 mg/dL — AB (ref 65–99)
POTASSIUM: 4.1 mmol/L (ref 3.5–5.1)
Sodium: 137 mmol/L (ref 135–145)

## 2017-04-23 LAB — APTT: aPTT: 29 seconds (ref 24–36)

## 2017-04-23 LAB — PROTIME-INR
INR: 0.96
Prothrombin Time: 12.7 seconds (ref 11.4–15.2)

## 2017-04-25 MED ORDER — CEFAZOLIN SODIUM-DEXTROSE 2-4 GM/100ML-% IV SOLN
2.0000 g | INTRAVENOUS | Status: AC
  Administered 2017-04-26: 2 g via INTRAVENOUS

## 2017-04-26 ENCOUNTER — Encounter
Admission: RE | Disposition: A | Discharge status: Home / Self Care | Payer: Self-pay | Source: Ambulatory Visit | Attending: Orthopedic Surgery

## 2017-04-26 ENCOUNTER — Ambulatory Visit
Admission: RE | Admit: 2017-04-26 | Discharge: 2017-04-26 | Disposition: A | Discharge status: Home / Self Care | Payer: No Typology Code available for payment source | Source: Ambulatory Visit | Attending: Orthopedic Surgery | Admitting: Orthopedic Surgery

## 2017-04-26 ENCOUNTER — Encounter: Payer: Self-pay | Admitting: *Deleted

## 2017-04-26 ENCOUNTER — Ambulatory Visit: Payer: No Typology Code available for payment source | Admitting: Anesthesiology

## 2017-04-26 ENCOUNTER — Other Ambulatory Visit: Payer: Self-pay

## 2017-04-26 DIAGNOSIS — M75121 Complete rotator cuff tear or rupture of right shoulder, not specified as traumatic: Secondary | ICD-10-CM | POA: Insufficient documentation

## 2017-04-26 DIAGNOSIS — Z87891 Personal history of nicotine dependence: Secondary | ICD-10-CM | POA: Insufficient documentation

## 2017-04-26 DIAGNOSIS — M7501 Adhesive capsulitis of right shoulder: Secondary | ICD-10-CM | POA: Insufficient documentation

## 2017-04-26 DIAGNOSIS — F419 Anxiety disorder, unspecified: Secondary | ICD-10-CM | POA: Insufficient documentation

## 2017-04-26 DIAGNOSIS — Z79899 Other long term (current) drug therapy: Secondary | ICD-10-CM | POA: Insufficient documentation

## 2017-04-26 DIAGNOSIS — F329 Major depressive disorder, single episode, unspecified: Secondary | ICD-10-CM | POA: Insufficient documentation

## 2017-04-26 DIAGNOSIS — I10 Essential (primary) hypertension: Secondary | ICD-10-CM | POA: Insufficient documentation

## 2017-04-26 DIAGNOSIS — K219 Gastro-esophageal reflux disease without esophagitis: Secondary | ICD-10-CM | POA: Insufficient documentation

## 2017-04-26 DIAGNOSIS — D649 Anemia, unspecified: Secondary | ICD-10-CM | POA: Insufficient documentation

## 2017-04-26 HISTORY — DX: Gastro-esophageal reflux disease without esophagitis: K21.9

## 2017-04-26 HISTORY — PX: SHOULDER ARTHROSCOPY WITH OPEN ROTATOR CUFF REPAIR: SHX6092

## 2017-04-26 HISTORY — DX: Other complications of anesthesia, initial encounter: T88.59XA

## 2017-04-26 HISTORY — DX: Family history of other specified conditions: Z84.89

## 2017-04-26 HISTORY — DX: Anemia, unspecified: D64.9

## 2017-04-26 HISTORY — DX: Nausea with vomiting, unspecified: R11.2

## 2017-04-26 HISTORY — DX: Adverse effect of unspecified anesthetic, initial encounter: T41.45XA

## 2017-04-26 HISTORY — DX: Nonrheumatic mitral (valve) prolapse: I34.1

## 2017-04-26 HISTORY — DX: Other specified postprocedural states: Z98.890

## 2017-04-26 HISTORY — DX: Unspecified asthma, uncomplicated: J45.909

## 2017-04-26 HISTORY — DX: Headache, unspecified: R51.9

## 2017-04-26 HISTORY — DX: Headache: R51

## 2017-04-26 SURGERY — ARTHROSCOPY, SHOULDER WITH REPAIR, ROTATOR CUFF, OPEN
Anesthesia: General | Site: Shoulder | Laterality: Right | Wound class: Clean

## 2017-04-26 MED ORDER — PROMETHAZINE HCL 25 MG/ML IJ SOLN
6.2500 mg | INTRAMUSCULAR | Status: DC | PRN
  Administered 2017-04-26: 6.25 mg via INTRAVENOUS

## 2017-04-26 MED ORDER — FENTANYL CITRATE (PF) 100 MCG/2ML IJ SOLN
50.0000 ug | Freq: Once | INTRAMUSCULAR | Status: AC
  Administered 2017-04-26: 50 ug via INTRAVENOUS

## 2017-04-26 MED ORDER — PHENYLEPHRINE HCL 10 MG/ML IJ SOLN
INTRAMUSCULAR | Status: AC
  Filled 2017-04-26: qty 1

## 2017-04-26 MED ORDER — BUPIVACAINE HCL (PF) 0.25 % IJ SOLN
INTRAMUSCULAR | Status: DC | PRN
  Administered 2017-04-26: 30 mL

## 2017-04-26 MED ORDER — FENTANYL CITRATE (PF) 100 MCG/2ML IJ SOLN
INTRAMUSCULAR | Status: DC | PRN
  Administered 2017-04-26: 50 ug via INTRAVENOUS
  Administered 2017-04-26: 100 ug via INTRAVENOUS

## 2017-04-26 MED ORDER — PROMETHAZINE HCL 25 MG/ML IJ SOLN
INTRAMUSCULAR | Status: AC
  Administered 2017-04-26: 6.25 mg via INTRAVENOUS
  Filled 2017-04-26: qty 1

## 2017-04-26 MED ORDER — LIDOCAINE HCL 4 % MT SOLN
OROMUCOSAL | Status: DC | PRN
  Administered 2017-04-26: 3 mL via TOPICAL

## 2017-04-26 MED ORDER — EPINEPHRINE 30 MG/30ML IJ SOLN
INTRAMUSCULAR | Status: AC
  Filled 2017-04-26: qty 1

## 2017-04-26 MED ORDER — PROPOFOL 500 MG/50ML IV EMUL
INTRAVENOUS | Status: DC | PRN
  Administered 2017-04-26: 125 ug/kg/min via INTRAVENOUS

## 2017-04-26 MED ORDER — NEOMYCIN-POLYMYXIN B GU 40-200000 IR SOLN
Status: DC | PRN
  Administered 2017-04-26: 2 mL

## 2017-04-26 MED ORDER — ONDANSETRON HCL 4 MG/2ML IJ SOLN
INTRAMUSCULAR | Status: AC
  Filled 2017-04-26: qty 2

## 2017-04-26 MED ORDER — BUPIVACAINE HCL (PF) 0.5 % IJ SOLN
INTRAMUSCULAR | Status: AC
  Filled 2017-04-26: qty 10

## 2017-04-26 MED ORDER — ONDANSETRON HCL 4 MG PO TABS: 4.0000 mg | ORAL_TABLET | Freq: Three times a day (TID) | ORAL | 0 refills | Status: DC | PRN

## 2017-04-26 MED ORDER — CHLORHEXIDINE GLUCONATE CLOTH 2 % EX PADS: 6.0000 | MEDICATED_PAD | Freq: Once | CUTANEOUS | Status: DC

## 2017-04-26 MED ORDER — DEXAMETHASONE SODIUM PHOSPHATE 10 MG/ML IJ SOLN
INTRAMUSCULAR | Status: AC
  Filled 2017-04-26: qty 1

## 2017-04-26 MED ORDER — PROPOFOL 500 MG/50ML IV EMUL
INTRAVENOUS | Status: AC
  Filled 2017-04-26: qty 50

## 2017-04-26 MED ORDER — SUCCINYLCHOLINE CHLORIDE 20 MG/ML IJ SOLN
INTRAMUSCULAR | Status: DC | PRN
  Administered 2017-04-26: 100 mg via INTRAVENOUS

## 2017-04-26 MED ORDER — MIDAZOLAM HCL 2 MG/2ML IJ SOLN
1.0000 mg | Freq: Once | INTRAMUSCULAR | Status: AC
  Administered 2017-04-26: 1 mg via INTRAVENOUS

## 2017-04-26 MED ORDER — LACTATED RINGERS IV SOLN
INTRAVENOUS | Status: DC
  Administered 2017-04-26: 07:00:00 via INTRAVENOUS

## 2017-04-26 MED ORDER — PROPOFOL 10 MG/ML IV BOLUS
INTRAVENOUS | Status: AC
  Filled 2017-04-26: qty 20

## 2017-04-26 MED ORDER — SUCCINYLCHOLINE CHLORIDE 20 MG/ML IJ SOLN
INTRAMUSCULAR | Status: AC
  Filled 2017-04-26: qty 1

## 2017-04-26 MED ORDER — PHENYLEPHRINE HCL 10 MG/ML IJ SOLN
INTRAMUSCULAR | Status: DC | PRN
  Administered 2017-04-26: 200 ug via INTRAVENOUS
  Administered 2017-04-26: 100 ug via INTRAVENOUS
  Administered 2017-04-26: 200 ug via INTRAVENOUS
  Administered 2017-04-26 (×3): 100 ug via INTRAVENOUS

## 2017-04-26 MED ORDER — ONDANSETRON HCL 4 MG/2ML IJ SOLN
INTRAMUSCULAR | Status: DC | PRN
  Administered 2017-04-26: 4 mg via INTRAVENOUS

## 2017-04-26 MED ORDER — LIDOCAINE HCL (PF) 2 % IJ SOLN
INTRAMUSCULAR | Status: AC
  Filled 2017-04-26: qty 10

## 2017-04-26 MED ORDER — PROPOFOL 10 MG/ML IV BOLUS
INTRAVENOUS | Status: DC | PRN
  Administered 2017-04-26: 50 mg via INTRAVENOUS
  Administered 2017-04-26: 150 mg via INTRAVENOUS

## 2017-04-26 MED ORDER — FENTANYL CITRATE (PF) 100 MCG/2ML IJ SOLN
INTRAMUSCULAR | Status: AC
  Administered 2017-04-26: 50 ug via INTRAVENOUS
  Filled 2017-04-26: qty 2

## 2017-04-26 MED ORDER — LIDOCAINE HCL (PF) 1 % IJ SOLN
INTRAMUSCULAR | Status: AC
  Filled 2017-04-26: qty 5

## 2017-04-26 MED ORDER — LIDOCAINE HCL (CARDIAC) 20 MG/ML IV SOLN
INTRAVENOUS | Status: DC | PRN
  Administered 2017-04-26: 50 mg via INTRAVENOUS

## 2017-04-26 MED ORDER — SUGAMMADEX SODIUM 200 MG/2ML IV SOLN
INTRAVENOUS | Status: DC | PRN
  Administered 2017-04-26: 140 mg via INTRAVENOUS

## 2017-04-26 MED ORDER — OXYCODONE HCL 5 MG PO TABS: 5.0000 mg | ORAL_TABLET | ORAL | 0 refills | Status: DC | PRN

## 2017-04-26 MED ORDER — NEOMYCIN-POLYMYXIN B GU 40-200000 IR SOLN
Status: AC
  Filled 2017-04-26: qty 2

## 2017-04-26 MED ORDER — SODIUM CHLORIDE FLUSH 0.9 % IV SOLN
INTRAVENOUS | Status: AC
  Filled 2017-04-26: qty 10

## 2017-04-26 MED ORDER — DEXAMETHASONE SODIUM PHOSPHATE 10 MG/ML IJ SOLN
INTRAMUSCULAR | Status: DC | PRN
Start: 2017-04-26 — End: 2017-04-26
  Administered 2017-04-26: 8 mg via INTRAVENOUS

## 2017-04-26 MED ORDER — PROPOFOL 10 MG/ML IV BOLUS
INTRAVENOUS | Status: AC
Start: 2017-04-26 — End: ?
  Filled 2017-04-26: qty 20

## 2017-04-26 MED ORDER — ROCURONIUM BROMIDE 100 MG/10ML IV SOLN
INTRAVENOUS | Status: DC | PRN
  Administered 2017-04-26: 5 mg via INTRAVENOUS
  Administered 2017-04-26: 40 mg via INTRAVENOUS
  Administered 2017-04-26 (×2): 10 mg via INTRAVENOUS
  Administered 2017-04-26: 15 mg via INTRAVENOUS

## 2017-04-26 MED ORDER — BUPIVACAINE HCL (PF) 0.25 % IJ SOLN
INTRAMUSCULAR | Status: AC
  Filled 2017-04-26: qty 30

## 2017-04-26 MED ORDER — FENTANYL CITRATE (PF) 100 MCG/2ML IJ SOLN
INTRAMUSCULAR | Status: AC
  Filled 2017-04-26: qty 2

## 2017-04-26 MED ORDER — EPINEPHRINE PF 1 MG/ML IJ SOLN
INTRAMUSCULAR | Status: DC | PRN
  Administered 2017-04-26: 11 mL

## 2017-04-26 MED ORDER — LIDOCAINE HCL (PF) 1 % IJ SOLN
INTRAMUSCULAR | Status: AC
  Filled 2017-04-26: qty 30

## 2017-04-26 MED ORDER — SODIUM CHLORIDE 0.9 % IV SOLN
INTRAVENOUS | Status: DC | PRN
  Administered 2017-04-26: 20 ug/min via INTRAVENOUS

## 2017-04-26 MED ORDER — FENTANYL CITRATE (PF) 100 MCG/2ML IJ SOLN: 25.0000 ug | INTRAMUSCULAR | Status: DC | PRN

## 2017-04-26 MED ORDER — MIDAZOLAM HCL 2 MG/2ML IJ SOLN
INTRAMUSCULAR | Status: AC
  Administered 2017-04-26: 1 mg via INTRAVENOUS
  Filled 2017-04-26: qty 2

## 2017-04-26 MED ORDER — LIDOCAINE HCL (PF) 1 % IJ SOLN
INTRAMUSCULAR | Status: DC | PRN
  Administered 2017-04-26: 5 mL via SUBCUTANEOUS

## 2017-04-26 MED ORDER — BUPIVACAINE LIPOSOME 1.3 % IJ SUSP
INTRAMUSCULAR | Status: DC | PRN
  Administered 2017-04-26: 20 mL via PERINEURAL

## 2017-04-26 MED ORDER — ROCURONIUM BROMIDE 50 MG/5ML IV SOLN
INTRAVENOUS | Status: AC
  Filled 2017-04-26: qty 1

## 2017-04-26 MED ORDER — BUPIVACAINE HCL (PF) 0.5 % IJ SOLN
INTRAMUSCULAR | Status: DC | PRN
  Administered 2017-04-26: 10 mL via PERINEURAL

## 2017-04-26 MED ORDER — CEFAZOLIN SODIUM-DEXTROSE 2-4 GM/100ML-% IV SOLN
INTRAVENOUS | Status: AC
  Filled 2017-04-26: qty 100

## 2017-04-26 MED ORDER — BUPIVACAINE LIPOSOME 1.3 % IJ SUSP
INTRAMUSCULAR | Status: AC
  Filled 2017-04-26: qty 20

## 2017-04-26 SURGICAL SUPPLY — 78 items
ADAPTER IRRIG TUBE 2 SPIKE SOL (ADAPTER) ×6 IMPLANT
ANCHOR ALL-SUT Q-FIX 2.8 (Anchor) ×3 IMPLANT
ANCHOR SUT 5.5 MULTIFIX (Orthopedic Implant) ×2 IMPLANT
ANCHOR SUT 5.5MM MULTIFIX (Orthopedic Implant) ×1 IMPLANT
BUR RADIUS 4.0X18.5 (BURR) ×3 IMPLANT
BUR RADIUS 5.5 (BURR) ×3 IMPLANT
CANNULA 5.75X7 CRYSTAL CLEAR (CANNULA) ×6 IMPLANT
CANNULA PARTIAL THREAD 2X7 (CANNULA) ×3 IMPLANT
CANNULA TWIST IN 8.25X9CM (CANNULA) IMPLANT
CHLORAPREP W/TINT 26ML (MISCELLANEOUS) ×12 IMPLANT
CLOSURE WOUND 1/2 X4 (GAUZE/BANDAGES/DRESSINGS) ×2
CONNECTOR PERFECT PASSER (CONNECTOR) ×3 IMPLANT
COOLER POLAR GLACIER W/PUMP (MISCELLANEOUS) ×3 IMPLANT
CRADLE LAMINECT ARM (MISCELLANEOUS) ×3 IMPLANT
DEVICE SUCT BLK HOLE OR FLOOR (MISCELLANEOUS) ×3 IMPLANT
DRAPE IMP U-DRAPE 54X76 (DRAPES) ×6 IMPLANT
DRAPE INCISE IOBAN 66X45 STRL (DRAPES) ×3 IMPLANT
DRAPE SHEET LG 3/4 BI-LAMINATE (DRAPES) ×3 IMPLANT
DRAPE U-SHAPE 47X51 STRL (DRAPES) IMPLANT
DURAPREP 26ML APPLICATOR (WOUND CARE) IMPLANT
ELECT REM PT RETURN 9FT ADLT (ELECTROSURGICAL) ×3
ELECTRODE REM PT RTRN 9FT ADLT (ELECTROSURGICAL) ×1 IMPLANT
GAUZE PETRO XEROFOAM 1X8 (MISCELLANEOUS) ×3 IMPLANT
GAUZE SPONGE 4X4 12PLY STRL (GAUZE/BANDAGES/DRESSINGS) ×6 IMPLANT
GLOVE BIOGEL PI IND STRL 7.0 (GLOVE) ×4 IMPLANT
GLOVE BIOGEL PI IND STRL 9 (GLOVE) ×1 IMPLANT
GLOVE BIOGEL PI INDICATOR 7.0 (GLOVE) ×8
GLOVE BIOGEL PI INDICATOR 9 (GLOVE) ×2
GLOVE SURG 9.0 ORTHO LTXF (GLOVE) ×6 IMPLANT
GOWN STRL REUS TWL 2XL XL LVL4 (GOWN DISPOSABLE) ×3 IMPLANT
GOWN STRL REUS W/ TWL LRG LVL3 (GOWN DISPOSABLE) ×3 IMPLANT
GOWN STRL REUS W/ TWL LRG LVL4 (GOWN DISPOSABLE) ×1 IMPLANT
GOWN STRL REUS W/TWL LRG LVL3 (GOWN DISPOSABLE) ×6
GOWN STRL REUS W/TWL LRG LVL4 (GOWN DISPOSABLE) ×2
IV LACTATED RINGER IRRG 3000ML (IV SOLUTION) ×22
IV LR IRRIG 3000ML ARTHROMATIC (IV SOLUTION) ×11 IMPLANT
KIT STABILIZATION SHOULDER (MISCELLANEOUS) ×3 IMPLANT
KIT SUTURE 2.8 Q-FIX DISP (MISCELLANEOUS) ×3 IMPLANT
KIT SUTURETAK 3.0 INSERT PERC (KITS) IMPLANT
KIT TURNOVER KIT A (KITS) ×3 IMPLANT
MANIFOLD NEPTUNE II (INSTRUMENTS) ×3 IMPLANT
MASK FACE SPIDER DISP (MASK) ×3 IMPLANT
MAT BLUE FLOOR 46X72 FLO (MISCELLANEOUS) ×6 IMPLANT
NDL MAYO CATGUT SZ5 (NEEDLE) ×2
NDL SAFETY ECLIPSE 18X1.5 (NEEDLE) ×1 IMPLANT
NDL SUT 5 .5 CRC TPR PNT MAYO (NEEDLE) ×1 IMPLANT
NEEDLE HYPO 18GX1.5 SHARP (NEEDLE) ×2
NEEDLE HYPO 22GX1.5 SAFETY (NEEDLE) ×3 IMPLANT
NS IRRIG 500ML POUR BTL (IV SOLUTION) ×3 IMPLANT
PACK ARTHROSCOPY SHOULDER (MISCELLANEOUS) ×3 IMPLANT
PAD WRAPON POLAR SHDR XLG (MISCELLANEOUS) ×1 IMPLANT
PASSER SUT CAPTURE FIRST (SUTURE) ×3 IMPLANT
SET TUBE SUCT SHAVER OUTFL 24K (TUBING) ×3 IMPLANT
SET TUBE TIP INTRA-ARTICULAR (MISCELLANEOUS) ×3 IMPLANT
STRAP SAFETY 5IN WIDE (MISCELLANEOUS) ×3 IMPLANT
STRIP CLOSURE SKIN 1/2X4 (GAUZE/BANDAGES/DRESSINGS) ×4 IMPLANT
SUT ETHILON 4-0 (SUTURE) ×2
SUT ETHILON 4-0 FS2 18XMFL BLK (SUTURE) ×1
SUT LASSO 90 DEG SD STR (SUTURE) IMPLANT
SUT MNCRL 4-0 (SUTURE) ×2
SUT MNCRL 4-0 27XMFL (SUTURE) ×1
SUT PDS AB 0 CT1 27 (SUTURE) ×3 IMPLANT
SUT PERFECTPASSER WHITE CART (SUTURE) ×9 IMPLANT
SUT SMART STITCH CARTRIDGE (SUTURE) ×9 IMPLANT
SUT TICRON 2-0 30IN 311381 (SUTURE) ×6 IMPLANT
SUT VIC AB 0 CT1 36 (SUTURE) ×3 IMPLANT
SUT VIC AB 2-0 CT2 27 (SUTURE) ×3 IMPLANT
SUTURE ETHLN 4-0 FS2 18XMF BLK (SUTURE) ×1 IMPLANT
SUTURE MAGNUM WIRE 2X48 BLK (SUTURE) IMPLANT
SUTURE MNCRL 4-0 27XMF (SUTURE) ×1 IMPLANT
SYR 10ML LL (SYRINGE) ×3 IMPLANT
SYR 3ML 18GX1 1/2 (SYRINGE) ×3 IMPLANT
TAPE MICROFOAM 4IN (TAPE) ×3 IMPLANT
TUBING ARTHRO INFLOW-ONLY STRL (TUBING) ×3 IMPLANT
TUBING CONNECTING 10 (TUBING) ×2 IMPLANT
TUBING CONNECTING 10' (TUBING) ×1
WAND HAND CNTRL MULTIVAC 90 (MISCELLANEOUS) ×3 IMPLANT
WRAPON POLAR PAD SHDR XLG (MISCELLANEOUS) ×3

## 2017-04-26 NOTE — Anesthesia Procedure Notes (Signed)
Procedure Name: Intubation Performed by: Lance Muss, CRNA Pre-anesthesia Checklist: Patient identified, Patient being monitored, Timeout performed, Emergency Drugs available and Suction available Patient Re-evaluated:Patient Re-evaluated prior to induction Oxygen Delivery Method: Circle system utilized Preoxygenation: Pre-oxygenation with 100% oxygen Induction Type: IV induction Ventilation: Mask ventilation without difficulty Laryngoscope Size: Mac and 3 Grade View: Grade I Tube type: Oral Tube size: 7.0 mm Number of attempts: 1 Airway Equipment and Method: Stylet Placement Confirmation: ETT inserted through vocal cords under direct vision,  positive ETCO2 and breath sounds checked- equal and bilateral Secured at: 23 cm Tube secured with: Tape Dental Injury: Teeth and Oropharynx as per pre-operative assessment

## 2017-04-26 NOTE — Transfer of Care (Signed)
Immediate Anesthesia Transfer of Care Note  [name]  Procedure(s) Performed: SHOULDER ARTHROSCOPY WITH OPEN ROTATOR CUFF REPAIR,SUBACROMINAL DECOMPRESSION (Right Shoulder)  Patient Location: PACU  Anesthesia Type:General  Level of Consciousness: sedated and responds to stimulation  Airway & Oxygen Therapy: Patient Spontanous Breathing and Patient connected to face mask oxygen  Post-op Assessment: Report given to RN and Post -op Vital signs reviewed and stable  Post vital signs: Reviewed and stable  Last Vitals:  Vitals Value Taken Time  BP 116/71 04/26/2017 10:59 AM  Temp 36.7 C 04/26/2017 10:56 AM  Pulse 84 04/26/2017 10:59 AM  Resp 14 04/26/2017 10:59 AM  SpO2 100 % 04/26/2017 10:59 AM    Last Pain:  Vitals:   04/26/17 0755  TempSrc:   PainSc: 0-No pain         Complications: No apparent anesthesia complications

## 2017-04-26 NOTE — Op Note (Signed)
04/26/2017  11:20 AM  PATIENT:  Shelby [name]  47 y.o. female  PRE-OPERATIVE DIAGNOSIS:  Left shoulder full thickness rotator cuff tear  POST-OPERATIVE DIAGNOSIS:  same  PROCEDURE:  Procedure(s): RIGHT SHOULDER ARTHROSCOPY WITH OPEN ROTATOR CUFF REPAIR,SUBACROMINAL DECOMPRESSION (Right)  SURGEON:  Surgeon(s) and Role:    Thornton Park, MD - Primary  ANESTHESIA:   local, general and paracervical block   PREOPERATIVE INDICATIONS:  Shelby [name] is a  47 y.o. female with a diagnosis of M75.101 Unsp rotatr-cuff tear/ruptr of right shoulder, not trauma failed conservative treatment and elected for surgical management.    The risks benefits and alternatives were discussed with the patient preoperatively including but not limited to the risks of infection, bleeding, nerve injury, persistent pain or weakness, failure of the hardware, re-tear of the rotator cuff and the need for further surgery. Medical risks include DVT and pulmonary embolism, myocardial infarction, stroke, pneumonia, respiratory failure and death. Patient understood these risks and wished to proceed.  OPERATIVE IMPLANTS: [place]ays multifix anchors x 1 & Smith [employer] Nephew Q Fix anchors x 1  OPERATIVE FINDINGS: Full thickness rotator cuff tear.  OPERATIVE PROCEDURE: The patient was met in the preoperative area. The right shoulder was signed with the word yes and my initials according the hospital's correct site of surgery protocol.   History and physical was updated.  Patient underwent an interscalene block by the anesthesia service.  Patient was brought to the operating room where she underwent general anesthesia.  The patient was placed in a beachchair position.  A spider arm positioner was used for this case. Examination under anesthesia revealed no limitation of motion or instability with load shift testing. The patient had a negative sulcus sign.  Patient was prepped and draped in a sterile fashion. A timeout  was performed to verify the patient's name, date of birth, medical record number, correct site of surgery and correct procedure to be performed there was also used to verify the patient received antibiotics that all appropriate instruments, implants and radiographs studies were available in the room. Once all in attendance were in agreement case began.  Bony landmarks were drawn out with a surgical marker along with proposed arthroscopy incisions. These were pre-injected with 1% lidocaine plain. An 11 blade was used to establish a posterior portal through which the arthroscope was placed in the glenohumeral joint. A full diagnostic examination of the shoulder was performed.  Patient was found to have a full thickness rotator cuff tear with large cuff of cuff tissue still attached to the greater tuberosity.   The arthroscope was then placed in the subacromial space.  Extensive bursitis was encountered and debrided using a 4-0 resector shaver blade and a 90 ArthroCare wand from a lateral portal which was established under direct visualization using an 18-gauge spinal needle. A subacromial decompression was also performed using a 5.5 mm resector shaver blade from the lateral portal.  A 4.0 mm resector shaver blade was then used to debride the greater tuberosity of all torn fibers of the rotator cuff.  Perfect Pass suture were placed in the lateral border of the rotator cuff tear. All arthroscopic instruments were then removed and the mini-open portion of the procedure began.  A saber-type incision was made along the lateral border of the acromion. The deltoid muscle was identified and split in line with its fibers which allowed visualization of the rotator cuff. The Perfect Pass sutures previously placed in the lateral border of the rotator  cuff were brought out through the deltoid split.    A single [employer] anchor was placed at the articular margin of the humeral head with the greater tuberosity.  The suture limbs of the Q Fix anchor were passed medially through the rotator cuff using a First Pass suture passer.   The Two Perfect Pass sutures were then anchored to the greater tuberosity footprint using a single Multifix anchor. This anchor was tensioned to allow for anatomic reduction of the rotator cuff to the greater tuberosity. The medial row sutures were then tied down using an arthroscopic knot tying technique.  A #2 Ticron was used to oversew the repair to the remaining cuff tissue on the greater tuberosity.  All incisions were copiously irrigated. The deltoid fascia was repaired using a 0 Vicryl suture.  The subcutaneous tissue of all incisions were closed with a 2-0 Vicryl. Skin closure for the arthroscopic incisions was performed with 4-0 nylon. The skin edges of the saber incision was approximated with a running 4-0 undyed Monocryl.  0.25% Marcaine plain was then injected into the subacromial space for postoperative pain control. A dry sterile dressing was applied.  The patient was placed in an abduction sling and a Polar Care was applied to the shoulder.  All sharp and it instrument counts were correct at the conclusion of the case. I was scrubbed and present for the entire case. I spoke with the patient's family postoperatively to let them know the case had gone without complication and the patient was stable in recovery room.

## 2017-04-26 NOTE — Anesthesia Post-op Follow-up Note (Signed)
Anesthesia QCDR form completed.        

## 2017-04-26 NOTE — Anesthesia Preprocedure Evaluation (Signed)
Anesthesia Evaluation  Patient identified by MRN, date of birth, ID band Patient awake    Reviewed: Allergy & Precautions, H&P , NPO status , Patient's Chart, lab work & pertinent test results, reviewed documented beta blocker date and time   History of Anesthesia Complications (+) PONV, Family history of anesthesia reaction and history of anesthetic complications  Airway Mallampati: II  TM Distance: >3 FB Neck ROM: full    Dental  (+) Caps, Dental Advidsory Given   Pulmonary neg shortness of breath, asthma , neg COPD, neg recent URI, former smoker,           Cardiovascular Exercise Tolerance: Good hypertension, (-) angina(-) CAD, (-) Past MI, (-) Cardiac Stents and (-) CABG (-) dysrhythmias + Valvular Problems/Murmurs MVP      Neuro/Psych PSYCHIATRIC DISORDERS Anxiety Depression negative neurological ROS     GI/Hepatic Neg liver ROS, GERD  ,  Endo/Other  negative endocrine ROS  Renal/GU negative Renal ROS  negative genitourinary   Musculoskeletal   Abdominal   Peds  Hematology negative hematology ROS (+)   Anesthesia Other Findings Past Medical History: No date: Anemia     Comment:  h/o with pregnancy No date: Anxiety No date: Asthma     Comment:  allergy induced-no inhaler No date: Complication of anesthesia No date: Environmental allergies No date: Family history of adverse reaction to anesthesia     Comment:  son-breathing problems-coded 1st time when he was 1 and               had another surgery at 71 and had to be admitted for               breathing problems No date: Family history of breast cancer No date: GERD (gastroesophageal reflux disease) No date: Headache     Comment:  migraines No date: Hypertension No date: Mitral valve disease No date: Mitral valve prolapse No date: Painful menstrual periods No date: PONV (postoperative nausea and vomiting) No date: Vertigo    Reproductive/Obstetrics negative OB ROS                             Anesthesia Physical Anesthesia Plan  ASA: II  Anesthesia Plan: General   Post-op Pain Management:    Induction: Intravenous  PONV Risk Score and Plan: 3 and TIVA, Ondansetron and Dexamethasone  Airway Management Planned: Oral ETT  Additional Equipment:   Intra-op Plan:   Post-operative Plan: Extubation in OR  Informed Consent: I have reviewed the patients History and Physical, chart, labs and discussed the procedure including the risks, benefits and alternatives for the proposed anesthesia with the patient or authorized representative who has indicated his/her understanding and acceptance.   Dental Advisory Given  Plan Discussed with: Anesthesiologist, CRNA and Surgeon  Anesthesia Plan Comments:         Anesthesia Quick Evaluation

## 2017-04-26 NOTE — Anesthesia Procedure Notes (Signed)
Anesthesia Regional Block: Interscalene brachial plexus block   Pre-Anesthetic Checklist: ,, timeout performed, Correct Patient, Correct Site, Correct Laterality, Correct Procedure, Correct Position, site marked, Risks and benefits discussed,  Surgical consent,  Pre-op evaluation,  At surgeon's request and post-op pain management  Laterality: Right  Prep: chloraprep       Needles:  Injection technique: Single-shot  Needle Type: Stimiplex     Needle Length: 10cm  Needle Gauge: 22     Additional Needles:   Procedures:,,,, ultrasound used (permanent image in chart),,,,  Narrative:  Start time: 04/26/2017 7:46 AM End time: 04/26/2017 7:51 AM Injection made incrementally with aspirations every 5 mL.  Performed by: Personally  Anesthesiologist: Martha Clan, MD  Additional Notes: Functioning IV was confirmed and monitors were applied.  A 63mm 22ga Stimuplex needle was used. Sterile prep and drape,hand hygiene and sterile gloves were used.  Negative aspiration and negative test dose prior to incremental administration of local anesthetic. The patient tolerated the procedure well.

## 2017-04-26 NOTE — Anesthesia Postprocedure Evaluation (Signed)
Anesthesia Post Note  [name]  Procedure(s) Performed: SHOULDER ARTHROSCOPY WITH OPEN ROTATOR CUFF REPAIR,SUBACROMINAL DECOMPRESSION (Right Shoulder)  Patient location during evaluation: PACU Anesthesia Type: General and Regional Level of consciousness: awake and alert Pain management: pain level controlled Vital Signs Assessment: post-procedure vital signs reviewed and stable Respiratory status: spontaneous breathing, nonlabored ventilation, respiratory function stable and patient connected to nasal cannula oxygen Cardiovascular status: blood pressure returned to baseline and stable Postop Assessment: no apparent nausea or vomiting Anesthetic complications: no     Last Vitals:  Vitals:   04/26/17 1148 04/26/17 1304  BP: 118/62 112/70  Pulse: 66 69  Resp: 16   Temp: 37 C   SpO2: 96% 96%    Last Pain:  Vitals:   04/26/17 1304  TempSrc:   PainSc: 0-No pain                 Martha Clan

## 2017-04-26 NOTE — Discharge Instructions (Signed)
AMBULATORY SURGERY  DISCHARGE INSTRUCTIONS   1) The drugs that you were given will stay in your system until tomorrow so for the next 24 hours you should not:  A) Drive an automobile B) Make any legal decisions C) Drink any alcoholic beverage   2) You may resume regular meals tomorrow.  Today it is better to start with liquids and gradually work up to solid foods.  You may eat anything you prefer, but it is better to start with liquids, then soup and crackers, and gradually work up to solid foods.   3) Please notify your doctor immediately if you have any unusual bleeding, trouble breathing, redness and pain at the surgery site, drainage, fever, or pain not relieved by medication.    4) Additional Instructions:   Please contact your physician with any problems or Same Day Surgery at [phone], Monday through Friday 6 am to 4 pm, or [facility] [place] Main number at [phone].     Interscalene Nerve Block with Exparel  1.  For your surgery you have received an Interscalene Nerve Block with Exparel. 2. Nerve Blocks affect many types of nerves, including nerves that control movement, pain and normal sensation.  You may experience feelings such as numbness, tingling, heaviness, weakness or the inability to move your arm or the feeling or sensation that your arm has "fallen asleep". 3. A nerve block with Exparel can last up to 5 days.  Usually the weakness wears off first.  The tingling and heaviness usually wear off next.  Finally you may start to notice pain.  Keep in mind that this may occur in any order.  Once a nerve block starts to wear off it is usually completely gone within 60 minutes. 4. ISNB may cause mild shortness of breath, a hoarse voice, blurry vision, unequal pupils, or drooping of the face on the same side as the nerve block.  These symptoms will usually resolve with the numbness.  Very rarely the procedure itself can cause mild seizures. 5. If needed, your  surgeon will give you a prescription for pain medication.  It will take about 60 minutes for the oral pain medication to become fully effective.  So, it is recommended that you start taking this medication before the nerve block first begins to wear off, or when you first begin to feel discomfort. 6. Take your pain medication only as prescribed.  Pain medication can cause sedation and decrease your breathing if you take more than you need for the level of pain that you have. 7. Nausea is a common side effect of many pain medications.  You may want to eat something before taking your pain medicine to prevent nausea. 8. After an Interscalene nerve block, you cannot feel pain, pressure or extremes in temperature in the effected arm.  Because your arm is numb it is at an increased risk for injury.  To decrease the possibility of injury, please practice the following:  a. While you are awake change the position of your arm frequently to prevent too much pressure on any one area for prolonged periods of time. b.  If you have a cast or tight dressing, check the color or your fingers every couple of hours.  Call your surgeon with the appearance of any discoloration (white or blue). c. If you are given a sling to wear before you go home, please wear it  at all times until the block has completely worn off.  Do not get up at   night without your sling. d. Please contact [facility] or your surgeon if you do not begin to regain sensation after 7 days from the surgery.  Anesthesia may be contacted by calling the Same Day Surgery Department, Mon. through Fri., 6 am to 4 pm at [phone].   e. If you experience any other problems or concerns, please contact your surgeon's office. f. If you experience severe or prolonged shortness of breath go to the nearest emergency department. 

## 2017-04-26 NOTE — H&P (Signed)
The patient has been re-examined, and the chart reviewed, and there have been no interval changes to the documented history and physical.    The risks, benefits, and alternatives have been discussed at length, and the patient is willing to proceed.   

## 2017-04-26 NOTE — Progress Notes (Signed)
Can wiggle fingers   Skin warm and dry

## 2017-06-06 ENCOUNTER — Other Ambulatory Visit: Payer: Self-pay | Admitting: Family Medicine

## 2017-06-22 ENCOUNTER — Encounter: Payer: Self-pay | Admitting: Obstetrics and Gynecology

## 2017-06-22 ENCOUNTER — Ambulatory Visit (INDEPENDENT_AMBULATORY_CARE_PROVIDER_SITE_OTHER): Payer: No Typology Code available for payment source | Admitting: Obstetrics and Gynecology

## 2017-06-22 VITALS — BP 125/91 | HR 85 | Ht 66.0 in | Wt 150.5 lb

## 2017-06-22 DIAGNOSIS — Z01419 Encounter for gynecological examination (general) (routine) without abnormal findings: Secondary | ICD-10-CM

## 2017-06-22 DIAGNOSIS — N6311 Unspecified lump in the right breast, upper outer quadrant: Secondary | ICD-10-CM

## 2017-06-22 NOTE — Patient Instructions (Signed)
     Fibrocystic Breast Changes Fibrocystic breast changes are changes that can make your breasts swollen or painful. These changes happen when tiny sacs of fluid (cysts) form in the breast. This is a common condition. It does not mean that you have cancer. It usually happens because of hormone changes during a monthly period. Follow these instructions at home:  Check your breasts after every monthly period. If you do not have monthly periods, check your breasts on the first day of every month. Check for: ? Soreness. ? New swelling or puffiness. ? A change in breast size. ? A change in a lump that was already there.  Take over-the-counter and prescription medicines only as told by your doctor.  Wear a support or sports bra that fits well. Wear this support especially when you are exercising.  Avoid or have less caffeine, fat, and sugar in what you eat and drink as told by your doctor. Contact a doctor if:  You have fluid coming from your nipple, especially if the fluid has blood in it.  You have new lumps or bumps in your breast.  Your breast gets puffy, red, and painful.  You have changes in how your breast looks.  Your nipple looks flat or it sinks into your breast. Get help right away if:  Your breast turns red, and the redness is spreading. Summary  Fibrocystic breast changes are changes that can make your breasts swollen or painful.  This condition can happen when you have hormone changes during your monthly period.  With this condition, it is important to check your breasts after every monthly period. If you do not have monthly periods, check your breasts on the first day of every month. This information is not intended to replace advice given to you by your health care provider. Make sure you discuss any questions you have with your health care provider. Document Released: 12/30/2007 Document Revised: 09/30/2015 Document Reviewed: 09/30/2015 Elsevier Interactive  Patient Education  2017 [employer].

## 2017-06-22 NOTE — Progress Notes (Signed)
Subjective:   Shelby [name] is a 47 y.o. G38P0 Caucasian female here for a routine well-woman exam.  No LMP recorded. Patient has had a hysterectomy.    Current complaints: had shoulder surgery and still having some pain. Was having dizzy spells and PCP instructed her to cut BP med in half. And BP has stable around 110-120/60-80 unless she takes it when in pain. Dizzy spells have stopped. PCP: [name]       Does desire labs  Social History: Sexual: heterosexual Marital Status: divorced Living situation: with family Occupation: unknown occupation Tobacco/alcohol: no tobacco use Illicit drugs: no history of illicit drug use  The following portions of the patient's history were reviewed and updated as appropriate: allergies, current medications, past family history, past medical history, past social history, past surgical history and problem list.  Past Medical History Past Medical History:  Diagnosis Date  . Anemia    h/o with pregnancy  . Anxiety   . Asthma    allergy induced-no inhaler  . Complication of anesthesia   . Environmental allergies   . Family history of adverse reaction to anesthesia    son-breathing problems-coded 1st time when he was 2 and had another surgery at 70 and had to be admitted for breathing problems  . Family history of breast cancer   . GERD (gastroesophageal reflux disease)   . Headache    migraines  . Hypertension   . Mitral valve disease   . Mitral valve prolapse   . Painful menstrual periods   . PONV (postoperative nausea and vomiting)   . Vertigo     Past Surgical History Past Surgical History:  Procedure Laterality Date  . ABDOMINAL HYSTERECTOMY  2010   supracervical   . BUNIONECTOMY Bilateral   . MANDIBLE RECONSTRUCTION     age 73-underbite  . SHOULDER ARTHROSCOPY WITH OPEN ROTATOR CUFF REPAIR Right 04/26/2017   Procedure: SHOULDER ARTHROSCOPY WITH OPEN ROTATOR CUFF REPAIR,SUBACROMINAL DECOMPRESSION;  Surgeon: Thornton Park, MD;   Location: [facility] ORS;  Service: Orthopedics;  Laterality: Right;  . TONSILLECTOMY      Gynecologic History G2P0  No LMP recorded. Patient has had a hysterectomy. Contraception: status post hysterectomy Last Pap: 06/21/16. Results were: normal Last mammogram: 03/03/16. Results were: normal  Obstetric History OB History  Gravida Para Term Preterm AB Living  2         2  SAB TAB Ectopic Multiple Live Births          2    # Outcome Date GA Lbr Len/2nd Weight Sex Delivery Anes PTL Lv  2 Gravida 2005    M Vag-Spont   LIV  1 Gravida 1996    M Vag-Spont   LIV    Current Medications Current Outpatient Medications on File Prior to Visit  Medication Sig Dispense Refill  . Ascorbic Acid (VITAMIN C PO) Take 1 tablet by mouth daily.     . Ergocalciferol (VITAMIN D2) 400 units TABS Take 400 Units by mouth at bedtime.     Marland Kitchen ibuprofen (ADVIL,MOTRIN) 200 MG tablet Take 400 mg by mouth daily as needed for moderate pain.    Marland Kitchen L-Methylfolate 15 MG TABS TAKE 1/2 TABLET (7.5 MG) BY MOUTH DAILY 45 tablet 3  . losartan (COZAAR) 50 MG tablet TAKE 1 TABLET BY MOUTH DAILY 90 tablet 3  . montelukast (SINGULAIR) 10 MG tablet Take 1 tablet (10 mg total) by mouth at bedtime. 30 tablet 12  . Multiple Vitamins-Minerals (ZINC PO) Take 1 capsule  by mouth daily.     . Probiotic CAPS Take 1 capsule by mouth at bedtime.    . ranitidine (ZANTAC) 75 MG tablet Take 75 mg by mouth daily as needed for heartburn.    . tretinoin (RETIN-A) 0.1 % cream Apply 1 application topically at bedtime.    . ALPRAZolam (XANAX) 0.5 MG tablet Take 1 tablet (0.5 mg total) by mouth every 4 (four) hours as needed for anxiety. (Patient not taking: Reported on 06/22/2017) 15 tablet 0  . Budesonide (RHINOCORT ALLERGY NA) Place 1 spray into the nose daily as needed (allergies).    . EPINEPHrine 0.3 mg/0.3 mL IJ SOAJ injection Inject 0.3 mLs (0.3 mg total) into the muscle once. (Patient not taking: Reported on 06/22/2017) 1 Device 12  . fluticasone  (FLONASE) 50 MCG/ACT nasal spray Place 1 spray into both nostrils daily as needed for allergies or rhinitis.    Marland Kitchen ondansetron (ZOFRAN) 4 MG tablet Take 1 tablet (4 mg total) by mouth every 8 (eight) hours as needed for nausea or vomiting. (Patient not taking: Reported on 06/22/2017) 30 tablet 0  . oxyCODONE (OXY IR/ROXICODONE) 5 MG immediate release tablet Take 1 tablet (5 mg total) by mouth every 4 (four) hours as needed. (Patient not taking: Reported on 06/22/2017) 40 tablet 0   No current facility-administered medications on file prior to visit.     Review of Systems Patient denies any headaches, blurred vision, shortness of breath, chest pain, abdominal pain, problems with bowel movements, urination, or intercourse.  Objective:  BP (!) 125/91   Pulse 85   Ht 5\' 6"  (1.676 m)   Wt 150 lb 8 oz (68.3 kg)   BMI 24.29 kg/m  Physical Exam  General:  Well developed, well nourished, no acute distress. She is alert and oriented x3. Skin:  Warm and dry Neck:  Midline trachea, no thyromegaly or nodules Cardiovascular: Regular rate and rhythm, no murmur heard Lungs:  Effort normal, all lung fields clear to auscultation bilaterally Breasts:   dominant palpable mass in upper outer right breast, cord-like approxiamately 3cm long x 1cm wide, moveable and not tender without, retraction, or nipple discharge, left breast with scattered fibrocystic tissue noted Abdomen:  Soft, non tender, no hepatosplenomegaly or masses Pelvic:  External genitalia is normal in appearance.  The vagina is normal in appearance. The cervix is bulbous, no CMT.  Thin prep pap is not done. Uterus is surgically absent. No adnexal masses or tenderness noted. Extremities:  No swelling or varicosities noted Psych:  She has a normal mood and affect  Assessment:   Healthy well-woman exam S/P hysterectomy Hot flashes Right breast mass Fibrocystic breast tissue  Plan:  Labs obtained- will follow up accordingly F/U 1 year for  AE, or sooner if needed Diagnostic Mammogram & bilateral u/s ordered- will follow up accordingly.  Melody Rockney Ghee, CNM

## 2017-06-23 LAB — COMPREHENSIVE METABOLIC PANEL
ALBUMIN: 5 g/dL (ref 3.5–5.5)
ALT: 11 IU/L (ref 0–32)
AST: 18 IU/L (ref 0–40)
Albumin/Globulin Ratio: 2.3 — ABNORMAL HIGH (ref 1.2–2.2)
Alkaline Phosphatase: 51 IU/L (ref 39–117)
BUN / CREAT RATIO: 20 (ref 9–23)
BUN: 17 mg/dL (ref 6–24)
Bilirubin Total: 0.4 mg/dL (ref 0.0–1.2)
CALCIUM: 10.1 mg/dL (ref 8.7–10.2)
CO2: 24 mmol/L (ref 20–29)
CREATININE: 0.86 mg/dL (ref 0.57–1.00)
Chloride: 98 mmol/L (ref 96–106)
GFR, EST AFRICAN AMERICAN: 92 mL/min/{1.73_m2} (ref >59)
GFR, EST NON AFRICAN AMERICAN: 80 mL/min/{1.73_m2} (ref >59)
Globulin, Total: 2.2 g/dL (ref 1.5–4.5)
Glucose: 93 mg/dL (ref 65–99)
Potassium: 4 mmol/L (ref 3.5–5.2)
SODIUM: 137 mmol/L (ref 134–144)
TOTAL PROTEIN: 7.2 g/dL (ref 6.0–8.5)

## 2017-06-23 LAB — LIPID PANEL
CHOL/HDL RATIO: 1.9 ratio (ref 0.0–4.4)
Cholesterol, Total: 224 mg/dL — ABNORMAL HIGH (ref 100–199)
HDL: 120 mg/dL (ref >39)
LDL CALC: 91 mg/dL (ref 0–99)
Triglycerides: 64 mg/dL (ref 0–149)
VLDL CHOLESTEROL CAL: 13 mg/dL (ref 5–40)

## 2017-06-23 LAB — HEMOGLOBIN A1C
Est. average glucose Bld gHb Est-mCnc: 108 mg/dL
Hgb A1c MFr Bld: 5.4 % (ref 4.8–5.6)

## 2017-06-29 ENCOUNTER — Ambulatory Visit
Admission: RE | Admit: 2017-06-29 | Discharge: 2017-06-29 | Disposition: A | Discharge status: Home / Self Care | Payer: No Typology Code available for payment source | Source: Ambulatory Visit | Attending: Obstetrics and Gynecology | Admitting: Obstetrics and Gynecology

## 2017-06-29 DIAGNOSIS — N6311 Unspecified lump in the right breast, upper outer quadrant: Secondary | ICD-10-CM | POA: Insufficient documentation

## 2017-08-13 ENCOUNTER — Ambulatory Visit (INDEPENDENT_AMBULATORY_CARE_PROVIDER_SITE_OTHER): Payer: No Typology Code available for payment source | Admitting: Family Medicine

## 2017-08-13 ENCOUNTER — Encounter: Payer: Self-pay | Admitting: Family Medicine

## 2017-08-13 VITALS — BP 138/82 | HR 88 | Temp 99.3°F | Resp 16 | Wt 153.0 lb

## 2017-08-13 DIAGNOSIS — L03119 Cellulitis of unspecified part of limb: Secondary | ICD-10-CM

## 2017-08-13 MED ORDER — AMOXICILLIN-POT CLAVULANATE ER 1000-62.5 MG PO TB12: 2.0000 | ORAL_TABLET | Freq: Two times a day (BID) | ORAL | 0 refills | Status: AC

## 2017-08-13 NOTE — Progress Notes (Signed)
[name] Female    DOB: 1970-12-24   47 y.o.   MRN: [id number] Visit Date: 08/13/2017  Today's Provider: Wilhemena Durie, MD   Chief Complaint  Patient presents with  . Fever   Subjective:    HPI Patient comes in today c/o fever up to 102. [place] and she all of a sudden developed swelling in her right foot and ankle. She reports that the swelling will no go down, and it is hard to bear weight on it.  In the past week she has been swimming in the [place] and [place] and [facility]. She reports that she called the [facility] about 2 days ago and they gave her prednisone and antibiotics (Macrobid). She reports that she did not start the Macrobid because she was not convinced that her symptoms had anything to do with a UTI.   Patient reports that she has taken Tylenol to help with her symptoms, and it has helped a little. She does feel a little nauseous today, but has not vomited. She also c/o lower back pain that radiates to her right leg.    Allergies  Allergen Reactions  . Hydrocodone-Acetaminophen Hives    swollen face  . Iodine Hives  . Shellfish Allergy Hives     Current Outpatient Medications:  .  ALPRAZolam (XANAX) 0.5 MG tablet, Take 1 tablet (0.5 mg total) by mouth every 4 (four) hours as needed for anxiety. (Patient not taking: Reported on 06/22/2017), Disp: 15 tablet, Rfl: 0 .  Ascorbic Acid (VITAMIN C PO), Take 1 tablet by mouth daily. , Disp: , Rfl:  .  Budesonide (RHINOCORT ALLERGY NA), Place 1 spray into the nose daily as needed (allergies)., Disp: , Rfl:  .  EPINEPHrine 0.3 mg/0.3 mL IJ SOAJ injection, Inject 0.3 mLs (0.3 mg total) into the muscle once. (Patient not taking: Reported on 06/22/2017), Disp: 1 Device, Rfl: 12 .  Ergocalciferol (VITAMIN D2) 400 units TABS, Take 400 Units by mouth at bedtime. , Disp: , Rfl:  .  fluticasone (FLONASE) 50 MCG/ACT nasal spray,  Place 1 spray into both nostrils daily as needed for allergies or rhinitis., Disp: , Rfl:  .  ibuprofen (ADVIL,MOTRIN) 200 MG tablet, Take 400 mg by mouth daily as needed for moderate pain., Disp: , Rfl:  .  L-Methylfolate 15 MG TABS, TAKE 1/2 TABLET (7.5 MG) BY MOUTH DAILY, Disp: 45 tablet, Rfl: 3 .  losartan (COZAAR) 50 MG tablet, TAKE 1 TABLET BY MOUTH DAILY, Disp: 90 tablet, Rfl: 3 .  montelukast (SINGULAIR) 10 MG tablet, Take 1 tablet (10 mg total) by mouth at bedtime., Disp: 30 tablet, Rfl: 12 .  Multiple Vitamins-Minerals (ZINC PO), Take 1 capsule by mouth daily. , Disp: , Rfl:  .  ondansetron (ZOFRAN) 4 MG tablet, Take 1 tablet (4 mg total) by mouth every 8 (eight) hours as needed for nausea or vomiting. (Patient not taking: Reported on 06/22/2017), Disp: 30 tablet, Rfl: 0 .  oxyCODONE (OXY IR/ROXICODONE) 5 MG immediate release tablet, Take 1 tablet (5 mg total) by mouth every 4 (four) hours as needed. (Patient not taking: Reported on 06/22/2017), Disp: 40 tablet, Rfl: 0 .  Probiotic CAPS, Take 1 capsule by mouth at bedtime., Disp: , Rfl:  .  ranitidine (ZANTAC) 75 MG tablet, Take 75 mg by mouth daily as needed for heartburn., Disp: , Rfl:  .  tretinoin (RETIN-A) 0.1 % cream, Apply 1 application topically at bedtime., Disp: , Rfl:   Review of Systems  Constitutional: Positive for activity change, chills, fatigue and fever.  Respiratory: Negative for chest tightness, shortness of breath, wheezing and stridor.   Cardiovascular: Positive for leg swelling. Negative for chest pain and palpitations.  Endocrine: Negative for cold intolerance, heat intolerance, polydipsia, polyphagia and polyuria.  Genitourinary: Positive for frequency. Negative for decreased urine volume, difficulty urinating, dysuria, hematuria and urgency.  Musculoskeletal: Positive for back pain, joint swelling and myalgias.  Skin: Positive for color change.  Neurological: Negative for dizziness, syncope, weakness, numbness and  headaches.  Psychiatric/Behavioral: Negative.     Social History   Tobacco Use  . Smoking status: Former Smoker    Packs/day: 0.50    Years: 10.00    Pack years: 5.00    Types: Cigarettes    Last attempt to quit: 04/20/2007    Years since quitting: 10.3  . Smokeless tobacco: Never Used  . Tobacco comment: social smoker back then  Substance Use Topics  . Alcohol use: Yes    Comment: occas   Objective:   BP 138/82 (BP Location: Left Arm, Patient Position: Sitting, Cuff Size: Normal)   Pulse 88   Temp 99.3 F (37.4 C)   Resp 16   Wt 153 lb (69.4 kg)   SpO2 99%   BMI 24.69 kg/m  Vitals:   08/13/17 1515  BP: 138/82  Pulse: 88  Resp: 16  Temp: 99.3 F (37.4 C)  SpO2: 99%  Weight: 153 lb (69.4 kg)     Physical Exam  Constitutional: She is oriented to person, place, and time. She appears well-developed and well-nourished.  HENT:  Head: Normocephalic and atraumatic.  Right Ear: External ear normal.  Left Ear: External ear normal.  Nose: Nose normal.  Eyes: Conjunctivae are normal. No scleral icterus.  Neck: No thyromegaly present.  Cardiovascular: Normal rate, regular rhythm and normal heart sounds.  Pulmonary/Chest: Effort normal and breath sounds normal.  Abdominal: Soft.  Musculoskeletal: She exhibits edema and tenderness.  Swollen right foot without fluctuance.There are no streaks up the leg. There is erythema on top of foot. She does have broken skin between 2nd and 3rd toes.  Neurological: She is alert and oriented to person, place, and time.  Skin: Skin is warm and dry. There is erythema.  Psychiatric: She has a normal mood and affect. Her behavior is normal. Judgment and thought content normal.        Assessment & Plan:     1. Cellulitis of foot Significant infection/afebrile today. Augmentin for now--may consider Doxy although it does not look like MRSA.RTC 2-3 days. - amoxicillin-clavulanate (AUGMENTIN XR) 1000-62.5 MG 12 hr tablet; Take 2 tablets by  mouth 2 (two) times daily for 7 days.  Dispense: 28 tablet; Refill: 0 - CBC with Differential/Platelet      I have done the exam and reviewed the chart and it is accurate to the best of my knowledge. Development worker, community has been used and  any errors in dictation or transcription are unintentional. Miguel Aschoff M.D. [place], MD  [facility]

## 2017-08-14 ENCOUNTER — Encounter: Payer: Self-pay | Admitting: Family Medicine

## 2017-08-14 LAB — CBC WITH DIFFERENTIAL/PLATELET
BASOS: 0 %
Basophils Absolute: 0 10*3/uL (ref 0.0–0.2)
EOS (ABSOLUTE): 0.2 10*3/uL (ref 0.0–0.4)
Eos: 2 %
Hematocrit: 35 % (ref 34.0–46.6)
Hemoglobin: 11.8 g/dL (ref 11.1–15.9)
Immature Grans (Abs): 0 10*3/uL (ref 0.0–0.1)
Immature Granulocytes: 0 %
LYMPHS ABS: 0.4 10*3/uL — AB (ref 0.7–3.1)
Lymphs: 4 %
MCH: 30.5 pg (ref 26.6–33.0)
MCHC: 33.7 g/dL (ref 31.5–35.7)
MCV: 90 fL (ref 79–97)
MONOS ABS: 0.1 10*3/uL (ref 0.1–0.9)
Monocytes: 1 %
NEUTROS ABS: 8.8 10*3/uL — AB (ref 1.4–7.0)
Neutrophils: 93 %
PLATELETS: 198 10*3/uL (ref 150–450)
RBC: 3.87 x10E6/uL (ref 3.77–5.28)
RDW: 12.8 % (ref 12.3–15.4)
WBC: 9.6 10*3/uL (ref 3.4–10.8)

## 2017-08-14 NOTE — Telephone Encounter (Signed)
Pt called wanting to make sure Dr [name] seen her note and pictures to him in my chart  CB#  [phone]  Thanks Con Memos

## 2017-08-14 NOTE — Telephone Encounter (Signed)
Dr. [name], please review pictures that the patient attached and the message. Thanks!

## 2017-08-15 ENCOUNTER — Ambulatory Visit (INDEPENDENT_AMBULATORY_CARE_PROVIDER_SITE_OTHER): Payer: No Typology Code available for payment source | Admitting: Family Medicine

## 2017-08-15 ENCOUNTER — Encounter: Payer: Self-pay | Admitting: Family Medicine

## 2017-08-15 VITALS — BP 178/82 | HR 88 | Temp 99.0°F | Resp 16 | Wt 153.0 lb

## 2017-08-15 DIAGNOSIS — L03115 Cellulitis of right lower limb: Secondary | ICD-10-CM

## 2017-08-15 NOTE — Progress Notes (Signed)
[name] Female    DOB: February 24, 1970   47 y.o.   MRN: [id number] Visit Date: 08/15/2017  Today's Provider: Wilhemena Durie, MD   Chief Complaint  Patient presents with  . Cellulitis    recheck   Subjective:    HPI Patient comes in today for a recheck of cellulitis. She reports that the abx that was prescribed (Augmentin) gave her severe diarrhea and nausea. She was advised to take 1 table TID instead of 2 tablets BID. Patient reports that she has still been taking 2 tablets BID.  Patient reports that when she woke up this morning, she has a fogginess sensation in her head. Her foot is still swollen and painful to bear weight on.     Allergies  Allergen Reactions  . Hydrocodone-Acetaminophen Hives    swollen face  . Iodine Hives  . Shellfish Allergy Hives     Current Outpatient Medications:  .  ALPRAZolam (XANAX) 0.5 MG tablet, Take 1 tablet (0.5 mg total) by mouth every 4 (four) hours as needed for anxiety. (Patient not taking: Reported on 06/22/2017), Disp: 15 tablet, Rfl: 0 .  amoxicillin-clavulanate (AUGMENTIN XR) 1000-62.5 MG 12 hr tablet, Take 2 tablets by mouth 2 (two) times daily for 7 days., Disp: 28 tablet, Rfl: 0 .  Ascorbic Acid (VITAMIN C PO), Take 1 tablet by mouth daily. , Disp: , Rfl:  .  Budesonide (RHINOCORT ALLERGY NA), Place 1 spray into the nose daily as needed (allergies)., Disp: , Rfl:  .  EPINEPHrine 0.3 mg/0.3 mL IJ SOAJ injection, Inject 0.3 mLs (0.3 mg total) into the muscle once. (Patient not taking: Reported on 06/22/2017), Disp: 1 Device, Rfl: 12 .  Ergocalciferol (VITAMIN D2) 400 units TABS, Take 400 Units by mouth at bedtime. , Disp: , Rfl:  .  fluticasone (FLONASE) 50 MCG/ACT nasal spray, Place 1 spray into both nostrils daily as needed for allergies or rhinitis., Disp: , Rfl:  .  ibuprofen (ADVIL,MOTRIN) 200 MG tablet, Take 400 mg by mouth daily as needed for moderate pain., Disp: , Rfl:  .  L-Methylfolate 15 MG TABS, TAKE  1/2 TABLET (7.5 MG) BY MOUTH DAILY, Disp: 45 tablet, Rfl: 3 .  losartan (COZAAR) 50 MG tablet, TAKE 1 TABLET BY MOUTH DAILY, Disp: 90 tablet, Rfl: 3 .  montelukast (SINGULAIR) 10 MG tablet, Take 1 tablet (10 mg total) by mouth at bedtime., Disp: 30 tablet, Rfl: 12 .  Multiple Vitamins-Minerals (ZINC PO), Take 1 capsule by mouth daily. , Disp: , Rfl:  .  ondansetron (ZOFRAN) 4 MG tablet, Take 1 tablet (4 mg total) by mouth every 8 (eight) hours as needed for nausea or vomiting. (Patient not taking: Reported on 06/22/2017), Disp: 30 tablet, Rfl: 0 .  oxyCODONE (OXY IR/ROXICODONE) 5 MG immediate release tablet, Take 1 tablet (5 mg total) by mouth every 4 (four) hours as needed. (Patient not taking: Reported on 06/22/2017), Disp: 40 tablet, Rfl: 0 .  Probiotic CAPS, Take 1 capsule by mouth at bedtime., Disp: , Rfl:  .  ranitidine (ZANTAC) 75 MG tablet, Take 75 mg by mouth daily as needed for heartburn., Disp: , Rfl:  .  tretinoin (RETIN-A) 0.1 % cream, Apply 1 application topically at bedtime., Disp: , Rfl:   Review of Systems  Constitutional: Positive for activity change and fatigue.  HENT: Negative.   Respiratory: Negative for shortness of breath.   Cardiovascular: Negative for chest pain, palpitations and leg swelling.  Gastrointestinal: Positive  for diarrhea.  Musculoskeletal: Positive for myalgias.  Skin: Negative for color change, pallor and rash.  Allergic/Immunologic: Negative.   Neurological: Positive for light-headedness and headaches. Negative for tremors and weakness.  Psychiatric/Behavioral: The patient is nervous/anxious.     Social History   Tobacco Use  . Smoking status: Former Smoker    Packs/day: 0.50    Years: 10.00    Pack years: 5.00    Types: Cigarettes    Last attempt to quit: 04/20/2007    Years since quitting: 10.3  . Smokeless tobacco: Never Used  . Tobacco comment: social smoker back then  Substance Use Topics  . Alcohol use: Yes    Comment: occas    Objective:   BP (!) 178/82 (BP Location: Left Arm, Patient Position: Sitting, Cuff Size: Normal)   Pulse 88   Temp 99 F (37.2 C)   Resp 16   Wt 153 lb (69.4 kg)   SpO2 96%   BMI 24.69 kg/m  Vitals:   08/15/17 1135  BP: (!) 178/82  Pulse: 88  Resp: 16  Temp: 99 F (37.2 C)  SpO2: 96%  Weight: 153 lb (69.4 kg)     Physical Exam  Constitutional: She is oriented to person, place, and time. She appears well-developed and well-nourished.  HENT:  Head: Normocephalic and atraumatic.  Cardiovascular: Normal rate, regular rhythm and normal heart sounds.  Pulmonary/Chest: Effort normal.  Abdominal: Soft.  Musculoskeletal:  Foot still swollen but not as tender. Erythema has almost completely resolved.  Neurological: She is alert and oriented to person, place, and time.  Skin: Skin is warm and dry.  Psychiatric: She has a normal mood and affect. Her behavior is normal. Judgment and thought content normal.        Assessment & Plan:     Cellulitis of foot Improving.Due to GI side effects will cut augmentin dose in half. WBC normal with left shift.     I have done the exam and reviewed the above chart and it is accurate to the best of my knowledge. Development worker, community has been used in this note in any air is in the dictation or transcription are unintentional.  Wilhemena Durie, MD  [place]

## 2017-08-20 ENCOUNTER — Ambulatory Visit (INDEPENDENT_AMBULATORY_CARE_PROVIDER_SITE_OTHER): Payer: No Typology Code available for payment source | Admitting: Family Medicine

## 2017-08-20 ENCOUNTER — Encounter: Payer: Self-pay | Admitting: Family Medicine

## 2017-08-20 VITALS — BP 126/74 | HR 78 | Temp 98.5°F | Resp 16 | Ht 66.0 in | Wt 148.0 lb

## 2017-08-20 DIAGNOSIS — L03115 Cellulitis of right lower limb: Secondary | ICD-10-CM | POA: Diagnosis not present

## 2017-08-20 MED ORDER — KETOROLAC TROMETHAMINE 60 MG/2ML IM SOLN
60.0000 mg | Freq: Once | INTRAMUSCULAR | Status: AC
  Administered 2017-08-20: 60 mg via INTRAMUSCULAR

## 2017-08-20 NOTE — Progress Notes (Signed)
[name] Female    DOB: 06-18-1970   47 y.o.   MRN: [id number] Visit Date: 08/20/2017  Today's Provider: Wilhemena Durie, MD   Chief Complaint  Patient presents with  . Cellulitis    follow up    Subjective:    HPI  Patient comes in today for a follow up on cellulitis on her right foot. She was last seen in the office on 08/15/17. She reports that she has 1 more dose left of the abx. The swelling and redness has decreased, but the patient reports that she is still unable to bear weight on her right foot without some pain after  A few minutes of weight bearing.    Allergies  Allergen Reactions  . Hydrocodone-Acetaminophen Hives    swollen face  . Iodine Hives  . Shellfish Allergy Hives     Current Outpatient Medications:  .  ranitidine (ZANTAC) 75 MG tablet, Take 75 mg by mouth daily as needed for heartburn., Disp: , Rfl:  .  tretinoin (RETIN-A) 0.1 % cream, Apply 1 application topically at bedtime., Disp: , Rfl:  .  ALPRAZolam (XANAX) 0.5 MG tablet, Take 1 tablet (0.5 mg total) by mouth every 4 (four) hours as needed for anxiety. (Patient not taking: Reported on 06/22/2017), Disp: 15 tablet, Rfl: 0 .  amoxicillin-clavulanate (AUGMENTIN XR) 1000-62.5 MG 12 hr tablet, Take 2 tablets by mouth 2 (two) times daily for 7 days., Disp: 28 tablet, Rfl: 0 .  Ascorbic Acid (VITAMIN C PO), Take 1 tablet by mouth daily. , Disp: , Rfl:  .  Budesonide (RHINOCORT ALLERGY NA), Place 1 spray into the nose daily as needed (allergies)., Disp: , Rfl:  .  EPINEPHrine 0.3 mg/0.3 mL IJ SOAJ injection, Inject 0.3 mLs (0.3 mg total) into the muscle once. (Patient not taking: Reported on 06/22/2017), Disp: 1 Device, Rfl: 12 .  Ergocalciferol (VITAMIN D2) 400 units TABS, Take 400 Units by mouth at bedtime. , Disp: , Rfl:  .  fluticasone (FLONASE) 50 MCG/ACT nasal spray, Place 1 spray into both nostrils daily as needed for allergies or rhinitis., Disp: , Rfl:  .  ibuprofen  (ADVIL,MOTRIN) 200 MG tablet, Take 400 mg by mouth daily as needed for moderate pain., Disp: , Rfl:  .  L-Methylfolate 15 MG TABS, TAKE 1/2 TABLET (7.5 MG) BY MOUTH DAILY, Disp: 45 tablet, Rfl: 3 .  losartan (COZAAR) 50 MG tablet, TAKE 1 TABLET BY MOUTH DAILY, Disp: 90 tablet, Rfl: 3 .  montelukast (SINGULAIR) 10 MG tablet, Take 1 tablet (10 mg total) by mouth at bedtime., Disp: 30 tablet, Rfl: 12 .  Multiple Vitamins-Minerals (ZINC PO), Take 1 capsule by mouth daily. , Disp: , Rfl:  .  ondansetron (ZOFRAN) 4 MG tablet, Take 1 tablet (4 mg total) by mouth every 8 (eight) hours as needed for nausea or vomiting. (Patient not taking: Reported on 06/22/2017), Disp: 30 tablet, Rfl: 0 .  oxyCODONE (OXY IR/ROXICODONE) 5 MG immediate release tablet, Take 1 tablet (5 mg total) by mouth every 4 (four) hours as needed. (Patient not taking: Reported on 06/22/2017), Disp: 40 tablet, Rfl: 0 .  Probiotic CAPS, Take 1 capsule by mouth at bedtime., Disp: , Rfl:   Review of Systems  Constitutional: Negative.   Eyes: Negative.   Respiratory: Negative.   Cardiovascular: Negative for chest pain, palpitations and leg swelling.  Endocrine: Negative.   Musculoskeletal: Positive for myalgias. Negative for arthralgias, gait problem and joint swelling.  Allergic/Immunologic: Negative.   Neurological: Negative for weakness and numbness.  Psychiatric/Behavioral: Negative.     Social History   Tobacco Use  . Smoking status: Former Smoker    Packs/day: 0.50    Years: 10.00    Pack years: 5.00    Types: Cigarettes    Last attempt to quit: 04/20/2007    Years since quitting: 10.3  . Smokeless tobacco: Never Used  . Tobacco comment: social smoker back then  Substance Use Topics  . Alcohol use: Yes    Comment: occas   Objective:   BP 126/74   Pulse 78   Temp 98.5 F (36.9 C)   Resp 16   Ht 5\' 6"  (1.676 m)   Wt 148 lb (67.1 kg)   SpO2 98%   BMI 23.89 kg/m  Vitals:   08/20/17 1446  BP: 126/74  Pulse: 78    Resp: 16  Temp: 98.5 F (36.9 C)  SpO2: 98%  Weight: 148 lb (67.1 kg)  Height: 5\' 6"  (1.676 m)     Physical Exam  Constitutional: She appears well-developed and well-nourished.  HENT:  Head: Normocephalic and atraumatic.  Right Ear: External ear normal.  Left Ear: External ear normal.  Nose: Nose normal.  Eyes: Conjunctivae are normal. No scleral icterus.  Neck: No thyromegaly present.  Cardiovascular: Normal rate, regular rhythm and normal heart sounds.  Pulmonary/Chest: Effort normal.  Abdominal: Soft.  Musculoskeletal: She exhibits edema. She exhibits no tenderness.  Mild swelling of right foot without erythema or fluctuance.  Skin: Skin is warm and dry.  Psychiatric: She has a normal mood and affect. Her behavior is normal. Judgment and thought content normal.        Assessment & Plan:     1. Cellulitis of right lower extremity At least 80% improved. Have discussed with ortho. - ketorolac (TORADOL) injection 60 mg F/u prn.      I have done the exam and reviewed the above chart and it is accurate to the best of my knowledge. Development worker, community has been used in this note in any air is in the dictation or transcription are unintentional.  Wilhemena Durie, MD  [place]

## 2017-08-22 ENCOUNTER — Encounter: Payer: Self-pay | Admitting: Family Medicine

## 2017-09-01 ENCOUNTER — Other Ambulatory Visit: Payer: Self-pay

## 2017-09-01 ENCOUNTER — Encounter: Payer: Self-pay | Admitting: Emergency Medicine

## 2017-09-01 ENCOUNTER — Emergency Department
Admission: EM | Admit: 2017-09-01 | Discharge: 2017-09-01 | Disposition: A | Discharge status: Home / Self Care | Payer: No Typology Code available for payment source | Attending: Emergency Medicine | Admitting: Emergency Medicine

## 2017-09-01 DIAGNOSIS — Z87891 Personal history of nicotine dependence: Secondary | ICD-10-CM | POA: Diagnosis not present

## 2017-09-01 DIAGNOSIS — F419 Anxiety disorder, unspecified: Secondary | ICD-10-CM | POA: Insufficient documentation

## 2017-09-01 DIAGNOSIS — J45909 Unspecified asthma, uncomplicated: Secondary | ICD-10-CM | POA: Insufficient documentation

## 2017-09-01 DIAGNOSIS — Z79899 Other long term (current) drug therapy: Secondary | ICD-10-CM | POA: Insufficient documentation

## 2017-09-01 DIAGNOSIS — I10 Essential (primary) hypertension: Secondary | ICD-10-CM | POA: Diagnosis not present

## 2017-09-01 DIAGNOSIS — F329 Major depressive disorder, single episode, unspecified: Secondary | ICD-10-CM | POA: Diagnosis not present

## 2017-09-01 DIAGNOSIS — M79675 Pain in left toe(s): Secondary | ICD-10-CM

## 2017-09-01 DIAGNOSIS — M79674 Pain in right toe(s): Secondary | ICD-10-CM | POA: Diagnosis present

## 2017-09-01 MED ORDER — FLUCONAZOLE 150 MG PO TABS: 150.0000 mg | ORAL_TABLET | Freq: Every day | ORAL | 1 refills | Status: AC

## 2017-09-01 MED ORDER — CLINDAMYCIN HCL 150 MG PO CAPS
300.0000 mg | ORAL_CAPSULE | Freq: Once | ORAL | Status: AC
  Administered 2017-09-01: 300 mg via ORAL
  Filled 2017-09-01: qty 2

## 2017-09-01 MED ORDER — CLINDAMYCIN HCL 300 MG PO CAPS: 300.0000 mg | ORAL_CAPSULE | Freq: Three times a day (TID) | ORAL | 0 refills | Status: AC

## 2017-09-01 MED ORDER — CLINDAMYCIN HCL 300 MG PO CAPS: 300.0000 mg | ORAL_CAPSULE | Freq: Three times a day (TID) | ORAL | 0 refills | Status: DC

## 2017-09-01 NOTE — ED Provider Notes (Signed)
[facility] Emergency Department Provider Note  ____________________________________________  Time seen: Approximately 10:05 PM  I have reviewed the triage vital signs and the nursing notes.   HISTORY  Chief Complaint Foot Pain    HPI Shelby [name] is a 47 y.o. female presents to the emergency department with acute onset of toe erythema that started today.  Patient reports that  she has recently recovered from right foot cellulitis that developed [place].  Patient reports that she was on Augmentin for approximately 2 weeks. Patient reports that she took her last dose of Augmentin today.  Patient reports that most of her pain is along the medial aspect of the great toe.  Patient reports that she initially thought that she had an ingrown toenail and ran her fingernail into medial nail bed after noticing right toe erythema.  Patient reports that toe erythema seems to be worsening and patient is concerned that cellulitis is reforming.  Patient denies a history of gout.  She denies recent tobacco, alcohol or red meat consumption.  She denies fever chills.  No alleviating measures have been attempted.   Past Medical History:  Diagnosis Date  . Anemia    h/o with pregnancy  . Anxiety   . Asthma    allergy induced-no inhaler  . Complication of anesthesia   . Environmental allergies   . Family history of adverse reaction to anesthesia    son-breathing problems-coded 1st time when he was 2 and had another surgery at 101 and had to be admitted for breathing problems  . Family history of breast cancer   . GERD (gastroesophageal reflux disease)   . Headache    migraines  . Hypertension   . Mitral valve disease   . Mitral valve prolapse   . Painful menstrual periods   . PONV (postoperative nausea and vomiting)   . Vertigo     Patient Active Problem List   Diagnosis Date Noted  . Bronchitis 03/10/2015  . Palpitations 02/15/2015  .  Anxiety 01/20/2015  . Clinical depression 01/20/2015  . Diverticulitis 01/20/2015  . BP (high blood pressure) 01/20/2015  . Adaptive colitis 01/20/2015  . Cannot sleep 01/20/2015  . Headache, variant migraine 01/20/2015  . Disorder of mitral valve 01/20/2015  . Herpes zona 01/20/2015  . Fibroid 05/07/2007  . Allergic rhinitis 10/29/2006    Past Surgical History:  Procedure Laterality Date  . ABDOMINAL HYSTERECTOMY  2010   supracervical   . BUNIONECTOMY Bilateral   . MANDIBLE RECONSTRUCTION     age 59-underbite  . SHOULDER ARTHROSCOPY WITH OPEN ROTATOR CUFF REPAIR Right 04/26/2017   Procedure: SHOULDER ARTHROSCOPY WITH OPEN ROTATOR CUFF REPAIR,SUBACROMINAL DECOMPRESSION;  Surgeon: Thornton Park, MD;  Location: [facility] ORS;  Service: Orthopedics;  Laterality: Right;  . TONSILLECTOMY      Prior to Admission medications   Medication Sig Start Date End Date Taking? Authorizing Provider  ALPRAZolam Duanne Moron) 0.5 MG tablet Take 1 tablet (0.5 mg total) by mouth every 4 (four) hours as needed for anxiety. Patient not taking: Reported on 06/22/2017 06/06/16   Jerrol Banana., MD  Ascorbic Acid (VITAMIN C PO) Take 1 tablet by mouth [name].     [provider]  Budesonide (RHINOCORT ALLERGY NA) Place 1 spray into the nose [name] as needed (allergies).    [provider]  clindamycin (CLEOCIN) 300 MG capsule Take 1 capsule (300 mg total) by mouth 3 (three) times [name] for 10 days. 09/01/17 09/11/17  Vallarie Mare M, PA-C  EPINEPHrine 0.3 mg/0.3 mL IJ SOAJ injection Inject 0.3 mLs (0.3 mg total) into the muscle once. Patient not taking: Reported on 06/22/2017 03/29/15   Jerrol Banana., MD  Ergocalciferol (VITAMIN D2) 400 units TABS Take 400 Units by mouth at bedtime.     [provider]  fluticasone (FLONASE) 50 MCG/ACT nasal spray Place 1 spray into both nostrils [name] as needed for allergies or rhinitis.    [provider]  ibuprofen (ADVIL,MOTRIN) 200 MG  tablet Take 400 mg by mouth [name] as needed for moderate pain.    [provider]  L-Methylfolate 15 MG TABS TAKE 1/2 TABLET (7.5 MG) BY MOUTH [name] 06/06/17   Jerrol Banana., MD  losartan (COZAAR) 50 MG tablet TAKE 1 TABLET BY MOUTH [name] 06/06/17   Jerrol Banana., MD  montelukast (SINGULAIR) 10 MG tablet Take 1 tablet (10 mg total) by mouth at bedtime. 08/30/15   Jerrol Banana., MD  Multiple Vitamins-Minerals (ZINC PO) Take 1 capsule by mouth [name].     [provider]  ondansetron (ZOFRAN) 4 MG tablet Take 1 tablet (4 mg total) by mouth every 8 (eight) hours as needed for nausea or vomiting. Patient not taking: Reported on 06/22/2017 04/26/17   Thornton Park, MD  oxyCODONE (OXY IR/ROXICODONE) 5 MG immediate release tablet Take 1 tablet (5 mg total) by mouth every 4 (four) hours as needed. Patient not taking: Reported on 06/22/2017 04/26/17   Thornton Park, MD  Probiotic CAPS Take 1 capsule by mouth at bedtime.    [provider]  ranitidine (ZANTAC) 75 MG tablet Take 75 mg by mouth [name] as needed for heartburn.    [provider]  tretinoin (RETIN-A) 0.1 % cream Apply 1 application topically at bedtime.    [provider]    Allergies Hydrocodone-acetaminophen; Iodine; and Shellfish allergy  Family History  Problem Relation Age of Onset  . Breast cancer Mother 19  . Diabetes Father   . Cancer Maternal Uncle        colon  . Breast cancer Maternal Grandmother 70    Social History Social History   Tobacco Use  . Smoking status: Former Smoker    Packs/day: 0.50    Years: 10.00    Pack years: 5.00    Types: Cigarettes    Last attempt to quit: 04/20/2007    Years since quitting: 10.3  . Smokeless tobacco: Never Used  . Tobacco comment: social smoker back then  Substance Use Topics  . Alcohol use: Yes    Comment: occas  . Drug use: No     Review of Systems  Constitutional: No fever/chills Eyes: No visual  changes. No discharge ENT: No upper respiratory complaints. Cardiovascular: no chest pain. Respiratory: no cough. No SOB. Gastrointestinal: No abdominal pain.  No nausea, no vomiting.  No diarrhea.  No constipation. Genitourinary: Negative for dysuria. No hematuria Musculoskeletal: Patient has right great toe pain.  Skin: Patient has right great toe erythema.  Neurological: Negative for headaches, focal weakness or numbness.  ____________________________________________   PHYSICAL EXAM:  VITAL SIGNS: ED Triage Vitals  Enc Vitals Group     BP 09/01/17 1940 (!) 165/93     Pulse Rate 09/01/17 1940 (!) 103     Resp 09/01/17 1940 17     Temp 09/01/17 1940 98.7 F (37.1 C)     Temp Source 09/01/17 1940 Oral     SpO2 09/01/17 1940 100 %  Weight 09/01/17 1941 148 lb (67.1 kg)     Height 09/01/17 1941 5\' 6"  (1.676 m)     Head Circumference --      Peak Flow --      Pain Score 09/01/17 1940 0     Pain Loc --      Pain Edu? --      Excl. in [place]? --      Constitutional: Alert and oriented. Well appearing and in no acute distress. Eyes: Conjunctivae are normal. PERRL. EOMI. Head: Atraumatic. Cardiovascular: Normal rate, regular rhythm. Normal S1 and S2.  Good peripheral circulation. Respiratory: Normal respiratory effort without tachypnea or retractions. Lungs CTAB. Good air entry to the bases with no decreased or absent breath sounds. Musculoskeletal: Full range of motion to all extremities. No gross deformities appreciated.  Patient has erythema and mild edema of the right great toe.  Patient has exquisite tenderness over the course of the right great toe.  Palpable dorsalis pedis pulse, right. Neurologic:  Normal speech and language. No gross focal neurologic deficits are appreciated.  Skin:  Skin is warm, dry and intact. No rash noted. Psychiatric: Mood and affect are normal. Speech and behavior are normal. Patient exhibits appropriate insight and  judgement.   ____________________________________________   LABS (all labs ordered are listed, but only abnormal results are displayed)  Labs Reviewed - No data to display ____________________________________________  EKG   ____________________________________________  RADIOLOGY   No results found.  ____________________________________________    PROCEDURES  Procedure(s) performed:    Procedures    Medications  clindamycin (CLEOCIN) capsule 300 mg (300 mg Oral Given 09/01/17 2113)     ____________________________________________   INITIAL IMPRESSION / ASSESSMENT AND PLAN / ED COURSE  Pertinent labs & imaging results that were available during my care of the patient were reviewed by me and considered in my medical decision making (see chart for details).  Review of the [place] CSRS was performed in accordance of the [place] prior to dispensing any controlled drugs.      Assessment and plan Right great toe pain Differential diagnosis includes ingrown toenail versus reforming cellulitis.  Patient was previously treated with Augmentin for right foot cellulitis and took her last dose as directed today.  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  Patient was treated empirically with clindamycin.  She received her first dose of clindamycin in the emergency department.  Patient was advised to engage in warm soapy right toe soaks 3 times [name] for the next week.  Patient was advised to return to the emergency department with worsening erythema, streaking or fever.  Patient was advised to follow-up with podiatry.  All patient questions were answered.     ____________________________________________  FINAL CLINICAL IMPRESSION(S) / ED DIAGNOSES  Final diagnoses:  Great toe pain, left      NEW MEDICATIONS STARTED DURING THIS VISIT:  ED Discharge Orders        Ordered    clindamycin  (CLEOCIN) 300 MG capsule  3 times [name],   Status:  Discontinued     09/01/17 2109    clindamycin (CLEOCIN) 300 MG capsule  3 times [name]     09/01/17 2115          This chart was dictated using voice recognition software/Dragon. Despite best efforts to proofread, errors can occur which can change the meaning. Any change was purely unintentional.    Vallarie Mare  Curt Jews 09/01/17 2218    Arta Silence, MD 09/01/17 [phone]

## 2017-09-01 NOTE — ED Triage Notes (Signed)
Pt comes into the ED via POV c/o swelling to the left great toe.  Patient recently treated for cellulitis in her foot and completed antibiotics.  She now has had this come up.  Patienthas redness present at the nailbed.  Patient in NAD at this time.

## 2017-09-03 ENCOUNTER — Other Ambulatory Visit: Payer: Self-pay | Admitting: Family Medicine

## 2017-09-03 ENCOUNTER — Encounter: Payer: Self-pay | Admitting: Podiatry

## 2017-09-03 ENCOUNTER — Ambulatory Visit: Payer: No Typology Code available for payment source | Admitting: Podiatry

## 2017-09-03 VITALS — BP 139/91 | HR 94 | Resp 16

## 2017-09-03 DIAGNOSIS — L600 Ingrowing nail: Secondary | ICD-10-CM

## 2017-09-03 DIAGNOSIS — L03115 Cellulitis of right lower limb: Secondary | ICD-10-CM

## 2017-09-03 DIAGNOSIS — L03119 Cellulitis of unspecified part of limb: Secondary | ICD-10-CM

## 2017-09-03 DIAGNOSIS — L03031 Cellulitis of right toe: Secondary | ICD-10-CM

## 2017-09-03 MED ORDER — NEOMYCIN-POLYMYXIN-HC 1 % OT SOLN: OTIC | 1 refills | Status: DC

## 2017-09-03 NOTE — Progress Notes (Signed)
Subjective:  Patient ID: Shelby [name], female    DOB: 1970/02/25,  MRN: [id number] HPI Chief Complaint  Patient presents with  . Toe Pain    Hallux right - medial border - tender x couple weeks - this initially started from severe cellulitis in right foot on July 14th, had fever, PCP put on Augmentin, completed and foot was still sore, said may have developed tendonitis so Rx'd another round of augmentin, big toe right was hurting medial border, red and swollen, so went to ER - Rx'd clindamycin for possible MRSA infection   . New Patient (Initial Visit)    47 y.o. female presents with the above complaint.   ROS: Denies fever chills nausea vomiting muscle aches pains calf pain back pain chest pain shortness of breath and headache.  Past Medical History:  Diagnosis Date  . Anemia    h/o with pregnancy  . Anxiety   . Asthma    allergy induced-no inhaler  . Complication of anesthesia   . Environmental allergies   . Family history of adverse reaction to anesthesia    son-breathing problems-coded 1st time when he was 2 and had another surgery at 60 and had to be admitted for breathing problems  . Family history of breast cancer   . GERD (gastroesophageal reflux disease)   . Headache    migraines  . Hypertension   . Mitral valve disease   . Mitral valve prolapse   . Painful menstrual periods   . PONV (postoperative nausea and vomiting)   . Vertigo    Past Surgical History:  Procedure Laterality Date  . ABDOMINAL HYSTERECTOMY  2010   supracervical   . BUNIONECTOMY Bilateral   . MANDIBLE RECONSTRUCTION     age 18-underbite  . SHOULDER ARTHROSCOPY WITH OPEN ROTATOR CUFF REPAIR Right 04/26/2017   Procedure: SHOULDER ARTHROSCOPY WITH OPEN ROTATOR CUFF REPAIR,SUBACROMINAL DECOMPRESSION;  Surgeon: Thornton Park, MD;  Location: [facility] ORS;  Service: Orthopedics;  Laterality: Right;  . TONSILLECTOMY      Current Outpatient Medications:  .  ALPRAZolam (XANAX) 0.5 MG tablet, Take  1 tablet (0.5 mg total) by mouth every 4 (four) hours as needed for anxiety. (Patient not taking: Reported on 06/22/2017), Disp: 15 tablet, Rfl: 0 .  Ascorbic Acid (VITAMIN C PO), Take 1 tablet by mouth daily. , Disp: , Rfl:  .  Budesonide (RHINOCORT ALLERGY NA), Place 1 spray into the nose daily as needed (allergies)., Disp: , Rfl:  .  clindamycin (CLEOCIN) 300 MG capsule, Take 1 capsule (300 mg total) by mouth 3 (three) times daily for 10 days., Disp: 30 capsule, Rfl: 0 .  EPINEPHrine 0.3 mg/0.3 mL IJ SOAJ injection, Inject 0.3 mLs (0.3 mg total) into the muscle once. (Patient not taking: Reported on 06/22/2017), Disp: 1 Device, Rfl: 12 .  Ergocalciferol (VITAMIN D2) 400 units TABS, Take 400 Units by mouth at bedtime. , Disp: , Rfl:  .  fluconazole (DIFLUCAN) 150 MG tablet, , Disp: , Rfl:  .  fluticasone (FLONASE) 50 MCG/ACT nasal spray, Place 1 spray into both nostrils daily as needed for allergies or rhinitis., Disp: , Rfl:  .  ibuprofen (ADVIL,MOTRIN) 200 MG tablet, Take 400 mg by mouth daily as needed for moderate pain., Disp: , Rfl:  .  L-Methylfolate 15 MG TABS, TAKE 1/2 TABLET (7.5 MG) BY MOUTH DAILY, Disp: 45 tablet, Rfl: 3 .  losartan (COZAAR) 50 MG tablet, TAKE 1 TABLET BY MOUTH DAILY, Disp: 90 tablet, Rfl: 3 .  montelukast (SINGULAIR)  10 MG tablet, Take 1 tablet (10 mg total) by mouth at bedtime., Disp: 30 tablet, Rfl: 12 .  NEOMYCIN-POLYMYXIN-HYDROCORTISONE (CORTISPORIN) 1 % SOLN OTIC solution, Apply 1-2 drops to toe BID after soaking, Disp: 10 mL, Rfl: 1 .  Probiotic CAPS, Take 1 capsule by mouth at bedtime., Disp: , Rfl:  .  ranitidine (ZANTAC) 75 MG tablet, Take 75 mg by mouth daily as needed for heartburn., Disp: , Rfl:  .  tretinoin (RETIN-A) 0.1 % cream, Apply 1 application topically at bedtime., Disp: , Rfl:   Allergies  Allergen Reactions  . Hydrocodone-Acetaminophen Hives    swollen face  . Iodine Hives  . Shellfish Allergy Hives   Review of Systems Objective:    Vitals:   09/03/17 1517  BP: (!) 139/91  Pulse: 94  Resp: 16    General: Well developed, nourished, in no acute distress, alert and oriented x3   Dermatological: Skin is warm, dry and supple bilateral. Nails x 10 are well maintained; remaining integument appears unremarkable at this time. There are no open sores, no preulcerative lesions, no rash or signs of infection present.  Sharp incurvated nail margin along the tibial border of the hallux right.  There is a small amount of purulence when I performed the surgical procedure.  There is not enough to culture.  Vascular: Dorsalis Pedis artery and Posterior Tibial artery pedal pulses are 2/4 bilateral with immedate capillary fill time. Pedal hair growth present. No varicosities and no lower extremity edema present bilateral.   Neruologic: Grossly intact via light touch bilateral. Vibratory intact via tuning fork bilateral. Protective threshold with Semmes Wienstein monofilament intact to all pedal sites bilateral. Patellar and Achilles deep tendon reflexes 2+ bilateral. No Babinski or clonus noted bilateral.   Musculoskeletal: No gross boney pedal deformities bilateral. No pain, crepitus, or limitation noted with foot and ankle range of motion bilateral. Muscular strength 5/5 in all groups tested bilateral.  Gait: Unassisted, Nonantalgic.    Radiographs:  None taken  Assessment & Plan:   Assessment: Ingrown toenail tibial border hallux right paronychia hallux right.  History of cellulitis right foot.  Plan: An incision and drainage was performed today after local anesthetic was administered.  She tolerated procedure well without complications.  I recommended she start soaking twice daily Epsom salts and warm water I also recommended that she continue her clindamycin as this is healing on follow-up with her in 1 to 2 weeks at the longest.     [place] Staffa T. Dwight, [place]

## 2017-09-03 NOTE — Patient Instructions (Signed)

## 2017-09-05 ENCOUNTER — Encounter: Payer: Self-pay | Admitting: Podiatry

## 2017-09-06 ENCOUNTER — Telehealth: Payer: Self-pay | Admitting: Podiatry

## 2017-09-06 NOTE — Telephone Encounter (Signed)
I had sent a message through [facility] with questions about the home care I am to do after the procedure Dr. Milinda Pointer performed on Monday. The AVS was a little unclear with how long I'm to be soaking with epsom salt. So the instructions just say the day after twice a day but does not really specify if I'm supposed to keep doing it. There was another section at the bottom which talks about killing the root and until it scabs over which could be 6 weeks. I've had cellulitis in my foot for about a month so I want to make sure I'm doing what I need to do to get this healed up. You can reach me at [phone]. Thank you.

## 2017-09-07 NOTE — Telephone Encounter (Signed)
I returned patient call and answered all questions regarding soaking toe and applying antibiotic drops. She verbalized understanding of instructions

## 2017-09-17 ENCOUNTER — Encounter: Payer: Self-pay | Admitting: Podiatry

## 2017-09-17 ENCOUNTER — Ambulatory Visit: Payer: No Typology Code available for payment source | Admitting: Podiatry

## 2017-09-17 DIAGNOSIS — L03031 Cellulitis of right toe: Secondary | ICD-10-CM

## 2017-09-17 NOTE — Progress Notes (Signed)
She presents today for follow-up of nail check hallux right.  She states is doing much better I am still taking the clindamycin for the redness and swelling of the foot and it seems to be doing much better I do think that the swelling is genetic.  Objective: Vital signs are stable she is alert and oriented x3.  Pulses are palpable.  Neurologic sensorium is intact she does have moderate edema to the forefoot which appears to be lymphedema but she also has a completely healed matrixectomy site along the tibial border of the hallux right.  Sometimes complains of pain to the left and would like to consider having matrixectomy performed there as well.  Assessment: Well-healing surgical foot with lymphedema and matrixectomy right.  Plan: Follow-up with me on an as-needed basis.

## 2017-10-22 ENCOUNTER — Encounter: Payer: Self-pay | Admitting: Family Medicine

## 2017-10-26 ENCOUNTER — Telehealth: Payer: Self-pay | Admitting: Family Medicine

## 2017-10-26 DIAGNOSIS — L309 Dermatitis, unspecified: Secondary | ICD-10-CM

## 2017-10-26 NOTE — Telephone Encounter (Signed)
[place] for whatever she needs.

## 2017-10-26 NOTE — Telephone Encounter (Signed)
Order was placed for derm referral.

## 2017-10-26 NOTE — Telephone Encounter (Signed)
Pt is calling back regarding the message she left if MyChart for Dr. [name].  Pt wants to know if he can give her another referral to the dermatologist. Please see the MyChart message for her reoccurring shoulder pain and itching.  Please advise.  Thanks, [employer]

## 2017-11-30 HISTORY — PX: BREAST BIOPSY: SHX20

## 2017-12-11 ENCOUNTER — Ambulatory Visit (INDEPENDENT_AMBULATORY_CARE_PROVIDER_SITE_OTHER): Payer: No Typology Code available for payment source | Admitting: Obstetrics and Gynecology

## 2017-12-11 ENCOUNTER — Encounter: Payer: Self-pay | Admitting: Obstetrics and Gynecology

## 2017-12-11 ENCOUNTER — Telehealth: Payer: Self-pay | Admitting: Obstetrics and Gynecology

## 2017-12-11 VITALS — BP 148/82 | HR 86 | Ht 66.0 in | Wt 151.3 lb

## 2017-12-11 DIAGNOSIS — N644 Mastodynia: Secondary | ICD-10-CM

## 2017-12-11 DIAGNOSIS — Z803 Family history of malignant neoplasm of breast: Secondary | ICD-10-CM

## 2017-12-11 DIAGNOSIS — N6320 Unspecified lump in the left breast, unspecified quadrant: Secondary | ICD-10-CM

## 2017-12-11 NOTE — Telephone Encounter (Signed)
pls advise

## 2017-12-11 NOTE — Telephone Encounter (Signed)
The patient called and stated that she was having issues with scheduling an MRI in [place] along with her mammogram. I spoke with Amy and was informed that the order for the MRI is currently being worked on and the patient will be updated when everything is finished. No other information was disclosed. Patient is expecting a call back to verify she is able to be seen at the [facility]). Please advise.

## 2017-12-11 NOTE — Progress Notes (Signed)
  Subjective:     Patient ID: Shelby [name], female   DOB: January 10, 1971, 47 y.o.   MRN: [id number]  HPI Reports feeling achiness in left breast x 6 days, and noticed lump. Area of concern in lower left breast at bra line, and it is still noted this am. Has a cord-like feel to it & is mobile. Does reports increased chest exercises at the gym over the last few weeks. Not sure if related to that. Had similar area in right breast in May and follow up film suggests fibrocystic breast tissue. Recommendation at that time was for Breast MRI due to increased lifetime risks >20%. Does report BRAC testing negative.   Does report having a week or two of bad hot flashes (s/p hysterectomy).   Review of Systems  Psychiatric/Behavioral: The patient is nervous/anxious.   All other systems reviewed and are negative.      Objective:   Physical Exam  A&Ox4 Well groomed female in no distress Blood pressure (!) 148/82, pulse 86, height 5\' 6"  (1.676 m), weight 151 lb 4.8 oz (68.6 kg). Breasts: symmetric fibrous changes in both upper outer quadrants, right breast normal without mass, skin or nipple changes or axillary nodes, abnormal mass palpable from 8-7 o'clock along bra line under left breast, tender to palpation, firm and mobile, without skin changes or nipple discharge. Negative lymphadenopathy bilaterally.      Assessment:     Left breast mass Bilateral fibrocystic breast tissue Family history of breast cancer in mother    Plan:     Breast u/s and diagnostic MMG ordered with consideration for breast MRI- will send to [facility] for films and seek their recommendation for need of breast MRI. Vit E 800 IU daily x 1 week, and instructed to decrease caffeine intake.   Alisandra Son,CNM

## 2017-12-12 NOTE — Telephone Encounter (Signed)
I sent all of the orders to [facility]. I told her they would see her first and then work on prior [place] for MRI.  Shelby [name]

## 2017-12-13 ENCOUNTER — Other Ambulatory Visit: Payer: Self-pay | Admitting: Obstetrics and Gynecology

## 2017-12-13 DIAGNOSIS — N644 Mastodynia: Principal | ICD-10-CM

## 2017-12-13 DIAGNOSIS — N6320 Unspecified lump in the left breast, unspecified quadrant: Secondary | ICD-10-CM

## 2017-12-17 ENCOUNTER — Other Ambulatory Visit: Payer: Self-pay | Admitting: Obstetrics and Gynecology

## 2017-12-17 ENCOUNTER — Ambulatory Visit
Admission: RE | Admit: 2017-12-17 | Discharge: 2017-12-17 | Disposition: A | Discharge status: Home / Self Care | Payer: No Typology Code available for payment source | Source: Ambulatory Visit | Attending: Obstetrics and Gynecology | Admitting: Obstetrics and Gynecology

## 2017-12-17 DIAGNOSIS — N6320 Unspecified lump in the left breast, unspecified quadrant: Secondary | ICD-10-CM

## 2017-12-17 DIAGNOSIS — Z803 Family history of malignant neoplasm of breast: Secondary | ICD-10-CM

## 2017-12-17 DIAGNOSIS — N644 Mastodynia: Principal | ICD-10-CM

## 2017-12-20 ENCOUNTER — Ambulatory Visit
Admission: RE | Admit: 2017-12-20 | Discharge: 2017-12-20 | Disposition: A | Discharge status: Home / Self Care | Payer: No Typology Code available for payment source | Source: Ambulatory Visit | Attending: Obstetrics and Gynecology | Admitting: Obstetrics and Gynecology

## 2017-12-20 DIAGNOSIS — N6320 Unspecified lump in the left breast, unspecified quadrant: Secondary | ICD-10-CM

## 2017-12-21 ENCOUNTER — Other Ambulatory Visit: Payer: Self-pay | Admitting: Obstetrics and Gynecology

## 2017-12-21 DIAGNOSIS — C50912 Malignant neoplasm of unspecified site of left female breast: Secondary | ICD-10-CM

## 2017-12-21 MED ORDER — DIAZEPAM 5 MG PO TABS: 5.0000 mg | ORAL_TABLET | Freq: Four times a day (QID) | ORAL | 0 refills | Status: DC | PRN

## 2017-12-23 DIAGNOSIS — C50919 Malignant neoplasm of unspecified site of unspecified female breast: Secondary | ICD-10-CM | POA: Insufficient documentation

## 2017-12-23 DIAGNOSIS — C50312 Malignant neoplasm of lower-inner quadrant of left female breast: Secondary | ICD-10-CM | POA: Insufficient documentation

## 2017-12-23 NOTE — Progress Notes (Signed)
[place]  [phone] [phone]  ID: Carolan Clines OB: April 09, 1970  MR#: [id number]  JEH#:[id number]  Patient Care Team: Jerrol Banana., MD as PCP - General (Family Medicine) [name], MD as Consulting Physician (Cardiology)  CHIEF COMPLAINT: Clinical stage IIIB triple negative invasive carcinoma of the lower inner quadrant of the left breast.  INTERVAL HISTORY: Patient is a 47 year old female who recently felt a discomfort in her breast and self palpated a nodule.  She subsequently mammogram and biopsy revealing the above-stated breast cancer.  Patient had a mammogram that was reported as BI-RADS 1 on Jun 29, 2017.  She is highly anxious, but otherwise feels well.  She has no neurologic complaints.  She denies any recent fevers or illnesses.  She has a good appetite and denies weight loss.  She has no chest pain or shortness of breath.  She denies any nausea, vomiting, constipation, or diarrhea.  She has no urinary complaints.  Patient otherwise feels well and offers no further specific complaints.  REVIEW OF SYSTEMS:   Review of Systems  Constitutional: Negative.  Negative for fever, malaise/fatigue and weight loss.  Respiratory: Negative.  Negative for cough, hemoptysis and shortness of breath.   Cardiovascular: Negative.  Negative for chest pain and leg swelling.  Gastrointestinal: Negative.  Negative for abdominal pain and melena.  Genitourinary: Negative.  Negative for dysuria.  Musculoskeletal: Negative.  Negative for back pain.  Skin: Negative.  Negative for rash.  Neurological: Negative.  Negative for dizziness, focal weakness and weakness.  Psychiatric/Behavioral: The patient is nervous/anxious.     As per HPI. Otherwise, a complete review of systems is negative.  PAST MEDICAL HISTORY: Past Medical History:  Diagnosis Date  . Anemia    h/o with pregnancy  . Anxiety   . Asthma    allergy induced-no inhaler  .  Complication of anesthesia   . Environmental allergies   . Family history of adverse reaction to anesthesia    son-breathing problems-coded 1st time when he was 2 and had another surgery at 28 and had to be admitted for breathing problems  . Family history of breast cancer   . GERD (gastroesophageal reflux disease)   . Headache    migraines  . Hypertension   . Mitral valve disease   . Mitral valve prolapse   . Painful menstrual periods   . PONV (postoperative nausea and vomiting)   . Vertigo     PAST SURGICAL HISTORY: Past Surgical History:  Procedure Laterality Date  . ABDOMINAL HYSTERECTOMY  2010   supracervical   . BUNIONECTOMY Bilateral   . MANDIBLE RECONSTRUCTION     age 58-underbite  . SHOULDER ARTHROSCOPY WITH OPEN ROTATOR CUFF REPAIR Right 04/26/2017   Procedure: SHOULDER ARTHROSCOPY WITH OPEN ROTATOR CUFF REPAIR,SUBACROMINAL DECOMPRESSION;  Surgeon: Thornton Park, MD;  Location: [facility] ORS;  Service: Orthopedics;  Laterality: Right;  . TONSILLECTOMY      FAMILY HISTORY: Family History  Problem Relation Age of Onset  . Breast cancer Mother 44  . Diabetes Father   . Cancer Maternal Uncle        colon  . Breast cancer Maternal Grandmother 58    ADVANCED DIRECTIVES (Y/N):  N  HEALTH MAINTENANCE: Social History   Tobacco Use  . Smoking status: Former Smoker    Packs/day: 0.50    Years: 10.00    Pack years: 5.00    Types: Cigarettes    Last attempt to quit: 04/20/2007  Years since quitting: 10.6  . Smokeless tobacco: Never Used  . Tobacco comment: social smoker back then  Substance Use Topics  . Alcohol use: Yes    Comment: occas  . Drug use: No     Colonoscopy:  PAP:  Bone density:  Lipid panel:  Allergies  Allergen Reactions  . Hydrocodone-Acetaminophen Hives    swollen face  . Iodine Hives  . Shellfish Allergy Hives    Current Outpatient Medications  Medication Sig Dispense Refill  . Ascorbic Acid (VITAMIN C PO) Take 1 tablet by mouth  daily.     . diazepam (VALIUM) 5 MG tablet Take 1 tablet (5 mg total) by mouth every 6 (six) hours as needed for anxiety. 30 tablet 0  . EPINEPHrine 0.3 mg/0.3 mL IJ SOAJ injection Inject 0.3 mLs (0.3 mg total) into the muscle once. 1 Device 12  . Ergocalciferol (VITAMIN D2) 400 units TABS Take 400 Units by mouth at bedtime.     . fluticasone (FLONASE) 50 MCG/ACT nasal spray Place 1 spray into both nostrils daily as needed for allergies or rhinitis.    Marland Kitchen ibuprofen (ADVIL,MOTRIN) 200 MG tablet Take 400 mg by mouth daily as needed for moderate pain.    Marland Kitchen L-Methylfolate 15 MG TABS TAKE 1/2 TABLET (7.5 MG) BY MOUTH DAILY 45 tablet 3  . lidocaine-prilocaine (EMLA) cream Apply to affected area once 30 g 3  . losartan (COZAAR) 50 MG tablet TAKE 1 TABLET BY MOUTH DAILY 90 tablet 3  . montelukast (SINGULAIR) 10 MG tablet Take 1 tablet (10 mg total) by mouth at bedtime. 30 tablet 12  . ondansetron (ZOFRAN) 8 MG tablet Take 1 tablet (8 mg total) by mouth 2 (two) times daily as needed. 30 tablet 2  . Probiotic CAPS Take 1 capsule by mouth at bedtime.    . prochlorperazine (COMPAZINE) 10 MG tablet Take 1 tablet (10 mg total) by mouth every 6 (six) hours as needed (Nausea or vomiting). 60 tablet 2  . tretinoin (RETIN-A) 0.1 % cream Apply 1 application topically at bedtime.     No current facility-administered medications for this visit.     OBJECTIVE: Vitals:   12/24/17 1340  BP: 125/89  Temp: 99.1 F (37.3 C)     Body mass index is 23.24 kg/m.    ECOG FS:0 - Asymptomatic  General: Well-developed, well-nourished, no acute distress. Eyes: Pink conjunctiva, anicteric sclera. HEENT: Normocephalic, moist mucous membranes, clear oropharnyx. Breast: Easily palpable left breast mass. Lungs: Clear to auscultation bilaterally. Heart: Regular rate and rhythm. No rubs, murmurs, or gallops. Abdomen: Soft, nontender, nondistended. No organomegaly noted, normoactive bowel sounds. Musculoskeletal: No edema,  cyanosis, or clubbing. Neuro: Alert, answering all questions appropriately. Cranial nerves grossly intact. Skin: No rashes or petechiae noted. Psych: Normal affect.  LAB RESULTS:  Lab Results  Component Value Date   NA 137 06/22/2017   K 4.0 06/22/2017   CL 98 06/22/2017   CO2 24 06/22/2017   GLUCOSE 93 06/22/2017   BUN 17 06/22/2017   CREATININE 0.86 06/22/2017   CALCIUM 10.1 06/22/2017   PROT 7.2 06/22/2017   ALBUMIN 5.0 06/22/2017   AST 18 06/22/2017   ALT 11 06/22/2017   ALKPHOS 51 06/22/2017   BILITOT 0.4 06/22/2017   GFRNONAA 80 06/22/2017   GFRAA 92 06/22/2017    Lab Results  Component Value Date   WBC 9.6 08/13/2017   NEUTROABS 8.8 (H) 08/13/2017   HGB 11.8 08/13/2017   HCT 35.0 08/13/2017   MCV 90  08/13/2017   PLT 198 08/13/2017     STUDIES: US Breast Ltd Uni Left Inc Axilla  Result Date: 12/17/2017 CLINICAL DATA:  Painful palpable abnormality in the LEFT breast first noticed 1 week ago. EXAM: DIGITAL DIAGNOSTIC BILATERAL MAMMOGRAM WITH CAD AND TOMO ULTRASOUND LEFT BREAST COMPARISON:  06/29/2017 and earlier ACR Breast Density Category d: The breast tissue is extremely dense, which lowers the sensitivity of mammography. FINDINGS: Right breast: Mammogram: No suspicious mass, distortion, or microcalcifications are identified to suggest presence of malignancy. Mammographic images were processed with CAD. Left breast: Mammogram: On spot tangential view of the LEFT breast there is a partially imaged irregular mass associated with fine linear calcifications. Mammographic images were processed with CAD. Physical Exam: I palpate a discrete firm mass in the 8 o'clock location of the LEFT breast 6 centimeters from nipple. Ultrasound: Targeted ultrasound is performed, showing the palpable abnormality is composed of 3 small masses, measured together to be 2.3 x 2.0 x 0.8 centimeters. These masses are irregular in shape, with irregular margins, and demonstrate internal blood flow  on Doppler evaluation. Mixed posterior acoustic features. Evaluation of the LEFT axilla demonstrates a single lymph node with thickened cortex. Lymph node is adjacent to blood vessels but may be amenable to ultrasound-guided biopsy. IMPRESSION: 1. Suspicious palpable mass in the 8 o'clock location of the LEFT breast for which ultrasound-guided core biopsy is indicated. 2. Suspicious LEFT axillary lymph node for which tissue sampling is recommended. At the time of biopsy, I would recommend assessment of the neighboring blood vessels to determine if there is an appropriate sonographic window for biopsy or ultrasound-guided placement of a HydroMARK clip. RECOMMENDATION: 1. Ultrasound-guided core biopsy of LEFT breast mass 8 o'clock location. 2. Ultrasound-guided core biopsy or HydroMARK clip placement of enlarged LEFT axillary lymph node if possible, see above. I have discussed the findings and recommendations with the patient. Results were also provided in writing at the conclusion of the visit. If applicable, a reminder letter will be sent to the patient regarding the next appointment. BI-RADS CATEGORY  4: Suspicious. Electronically Signed   By: Nolon Nations M.D.   On: 12/17/2017 16:13   Mm Diag Breast Tomo Bilateral  Result Date: 12/17/2017 CLINICAL DATA:  Painful palpable abnormality in the LEFT breast first noticed 1 week ago. EXAM: DIGITAL DIAGNOSTIC BILATERAL MAMMOGRAM WITH CAD AND TOMO ULTRASOUND LEFT BREAST COMPARISON:  06/29/2017 and earlier ACR Breast Density Category d: The breast tissue is extremely dense, which lowers the sensitivity of mammography. FINDINGS: Right breast: Mammogram: No suspicious mass, distortion, or microcalcifications are identified to suggest presence of malignancy. Mammographic images were processed with CAD. Left breast: Mammogram: On spot tangential view of the LEFT breast there is a partially imaged irregular mass associated with fine linear calcifications. Mammographic  images were processed with CAD. Physical Exam: I palpate a discrete firm mass in the 8 o'clock location of the LEFT breast 6 centimeters from nipple. Ultrasound: Targeted ultrasound is performed, showing the palpable abnormality is composed of 3 small masses, measured together to be 2.3 x 2.0 x 0.8 centimeters. These masses are irregular in shape, with irregular margins, and demonstrate internal blood flow on Doppler evaluation. Mixed posterior acoustic features. Evaluation of the LEFT axilla demonstrates a single lymph node with thickened cortex. Lymph node is adjacent to blood vessels but may be amenable to ultrasound-guided biopsy. IMPRESSION: 1. Suspicious palpable mass in the 8 o'clock location of the LEFT breast for which ultrasound-guided core biopsy is indicated. 2. Suspicious LEFT  axillary lymph node for which tissue sampling is recommended. At the time of biopsy, I would recommend assessment of the neighboring blood vessels to determine if there is an appropriate sonographic window for biopsy or ultrasound-guided placement of a HydroMARK clip. RECOMMENDATION: 1. Ultrasound-guided core biopsy of LEFT breast mass 8 o'clock location. 2. Ultrasound-guided core biopsy or HydroMARK clip placement of enlarged LEFT axillary lymph node if possible, see above. I have discussed the findings and recommendations with the patient. Results were also provided in writing at the conclusion of the visit. If applicable, a reminder letter will be sent to the patient regarding the next appointment. BI-RADS CATEGORY  4: Suspicious. Electronically Signed   By: Nolon Nations M.D.   On: 12/17/2017 16:13   [place] Axillary Node Core Biopsy Left  Addendum Date: 12/21/2017   ADDENDUM REPORT: 12/21/2017 12:38 ADDENDUM: Pathology revealed GRADE III INVASIVE DUCTAL CARCINOMA, DUCTAL CARCINOMA IN SITU, LYMPHOVASCULAR INVASION IS IDENTIFIED of the Left breast, 8 o'clock. METASTATIC CARCINOMA IN the Left axillary lymph node. This was  found to be concordant by Dr. Everlean Alstrom. Pathology results will discussed with the patient by telephone by Lorelle Gibbs, CNM at [facility] in [place], [place]. Melody Gayla [facility]us will arrange a surgical referral for the patient. Recommendation for a bilateral breast MRI due to family history and extremely dense breasts. Pathology results reported by Terie Purser, RN on 12/21/2017. Electronically Signed   By: Everlean Alstrom M.D.   On: 12/21/2017 12:38   Result Date: 12/21/2017 CLINICAL DATA:  47 year old female with suspicious cluster of palpable masses in the left breast at the 7 8 o'clock position as well as a suspicious lymph node in the left axilla with cortical thickening. EXAM: ULTRASOUND GUIDED LEFT BREAST CORE NEEDLE BIOPSY ULTRASOUND-GUIDED LEFT AXILLA CORE NEEDLE BIOPSY COMPARISON:  Previous exam(s). FINDINGS: I met with the patient and we discussed the procedure of ultrasound-guided biopsy, including benefits and alternatives. We discussed the high likelihood of a successful procedure. We discussed the risks of the procedure, including infection, bleeding, tissue injury, clip migration, and inadequate sampling. Informed written consent was given. The usual time-out protocol was performed immediately prior to the procedure. SITE 1: LEFT BREAST 8 O'CLOCK: MASS: Lesion quadrant: LOWER INNER Using sterile technique and 1% Lidocaine as local anesthetic, under direct ultrasound visualization, a 12 gauge spring-loaded device was used to perform biopsy of the largest of the cluster of masses at the 8 o'clock position using a medial to lateral approach. At the conclusion of the procedure a ribbon shaped tissue marker clip was deployed into the biopsy cavity. SITE 2: LEFT AXILLA: LYMPH NODE: Lesion quadrant: UPPER-OUTER Using sterile technique and 1% Lidocaine as local anesthetic, under direct ultrasound visualization, a 14 gauge spring-loaded device was used to perform biopsy of the lymph node  in the left axilla using a inferolateral to superior medial approach. At the conclusion of the procedure a spiral shaped HydroMARK tissue marker clip was deployed into the biopsy cavity. Follow up 2 view mammogram was performed and dictated separately. IMPRESSION: 1. Ultrasound-guided biopsy of the palpable mass in the left breast at the 8 o'clock position. 2. Ultrasound-guided biopsy of a lymph node with a thickened cortex in the left axilla. Electronically Signed: By: Everlean Alstrom M.D. On: 12/20/2017 14:38   Mm Clip Placement Left  Result Date: 12/20/2017 CLINICAL DATA:  Post ultrasound-guided biopsy of a suspicious mass at the 8 o'clock position as well as biopsy of a lymph node in the left axilla demonstrating a  thickened cortex. EXAM: DIAGNOSTIC LEFT MAMMOGRAM POST ULTRASOUND BIOPSY COMPARISON:  Previous exam(s). FINDINGS: Mammographic images were obtained following ultrasound guided biopsy of a suspicious mass in the left breast at the 8 o'clock position and ultrasound-guided biopsy of a suspicious lymph node in the left axilla. A ribbon shaped biopsy marking clip is present and felt to be located at the posterior margin of the biopsied mass in the left breast at the 8 o'clock position. Suspicious calcifications are noted to be present 1.5 cm lateral and superior to the biopsied mass in the left breast. The spiral shaped HydroMARK clip is present and located at the anterior margin of the biopsied lymph node in the left axilla. IMPRESSION: 1. Ribbon shaped biopsy marking clip along posterior margin of biopsied mass in the left breast. Suspicious calcifications are noted to be present 1.5 cm lateral and superior to the biopsied mass in the left breast. 2. Spiral shaped HydroMARK clip located at anterior margin of biopsied lymph node in the left axilla. Final Assessment: Post Procedure Mammograms for Marker Placement Electronically Signed   By: Everlean Alstrom M.D.   On: 12/20/2017 14:52   [place] Lt  Breast Bx W Loc Dev 1st Lesion Img Bx Spec US Guide  Addendum Date: 12/21/2017   ADDENDUM REPORT: 12/21/2017 12:38 ADDENDUM: Pathology revealed GRADE III INVASIVE DUCTAL CARCINOMA, DUCTAL CARCINOMA IN SITU, LYMPHOVASCULAR INVASION IS IDENTIFIED of the Left breast, 8 o'clock. METASTATIC CARCINOMA IN the Left axillary lymph node. This was found to be concordant by Dr. Everlean Alstrom. Pathology results will discussed with the patient by telephone by Lorelle Gibbs, CNM at [facility] in [place], [place]. Melody Gayla [facility] will arrange a surgical referral for the patient. Recommendation for a bilateral breast MRI due to family history and extremely dense breasts. Pathology results reported by Terie Purser, RN on 12/21/2017. Electronically Signed   By: Everlean Alstrom M.D.   On: 12/21/2017 12:38   Result Date: 12/21/2017 CLINICAL DATA:  47 year old female with suspicious cluster of palpable masses in the left breast at the 7 8 o'clock position as well as a suspicious lymph node in the left axilla with cortical thickening. EXAM: ULTRASOUND GUIDED LEFT BREAST CORE NEEDLE BIOPSY ULTRASOUND-GUIDED LEFT AXILLA CORE NEEDLE BIOPSY COMPARISON:  Previous exam(s). FINDINGS: I met with the patient and we discussed the procedure of ultrasound-guided biopsy, including benefits and alternatives. We discussed the high likelihood of a successful procedure. We discussed the risks of the procedure, including infection, bleeding, tissue injury, clip migration, and inadequate sampling. Informed written consent was given. The usual time-out protocol was performed immediately prior to the procedure. SITE 1: LEFT BREAST 8 O'CLOCK: MASS: Lesion quadrant: LOWER INNER Using sterile technique and 1% Lidocaine as local anesthetic, under direct ultrasound visualization, a 12 gauge spring-loaded device was used to perform biopsy of the largest of the cluster of masses at the 8 o'clock position using a medial to lateral approach. At  the conclusion of the procedure a ribbon shaped tissue marker clip was deployed into the biopsy cavity. SITE 2: LEFT AXILLA: LYMPH NODE: Lesion quadrant: UPPER-OUTER Using sterile technique and 1% Lidocaine as local anesthetic, under direct ultrasound visualization, a 14 gauge spring-loaded device was used to perform biopsy of the lymph node in the left axilla using a inferolateral to superior medial approach. At the conclusion of the procedure a spiral shaped HydroMARK tissue marker clip was deployed into the biopsy cavity. Follow up 2 view mammogram was performed and dictated separately. IMPRESSION: 1. Ultrasound-guided biopsy of  the palpable mass in the left breast at the 8 o'clock position. 2. Ultrasound-guided biopsy of a lymph node with a thickened cortex in the left axilla. Electronically Signed: By: Everlean Alstrom M.D. On: 12/20/2017 14:38    ASSESSMENT: Clinical stage IIIB triple negative invasive carcinoma of the lower inner quadrant of the left breast.  PLAN:    1. Clinical stage IIIB triple negative invasive carcinoma of the lower inner quadrant of the left breast: Appreciate surgical input earlier today.  Given patient's stage of disease, she will benefit from neoadjuvant chemotherapy using Adriamycin, Cytoxan with Neulasta support followed by carboplatinum and Taxol.  Patient has indicated she may prefer to undergo mastectomy, therefore adjuvant XRT may not be needed.  An aromatase inhibitor would not offer any benefit given the triple negative status of her disease.  Prior to initiating treatment patient will require CT scan of the chest, abdomen, and pelvis to complete the staging work-up.  She will also require MUGA scan.  A referral has been sent back to surgery for port placement.  Patient will return to clinic on January 03, 2018 to initiate cycle 1 of Adriamycin and Cytoxan.  I spent a total of 60 minutes face-to-face with the patient of which greater than 50% of the visit was spent  in counseling and coordination of care as detailed above.   Patient expressed understanding and was in agreement with this plan. She also understands that She can call clinic at any time with any questions, concerns, or complaints.   Cancer Staging Primary cancer of lower-inner quadrant of left female breast [facility]) Staging form: Breast, AJCC 8th Edition - Clinical stage from 12/24/2017: Stage IIIB (cT2, cN1, cM0, G3, ER-, PR-, HER2-) - Signed by Lloyd Huger, MD on 12/24/2017 - Pathologic: No stage assigned - Unsigned   Lloyd Huger, MD   12/24/2017 9:03 PM

## 2017-12-24 ENCOUNTER — Encounter: Payer: Self-pay | Admitting: General Surgery

## 2017-12-24 ENCOUNTER — Ambulatory Visit (INDEPENDENT_AMBULATORY_CARE_PROVIDER_SITE_OTHER): Payer: No Typology Code available for payment source | Admitting: General Surgery

## 2017-12-24 ENCOUNTER — Inpatient Hospital Stay: Payer: No Typology Code available for payment source | Attending: Oncology | Admitting: Oncology

## 2017-12-24 ENCOUNTER — Other Ambulatory Visit: Payer: Self-pay

## 2017-12-24 VITALS — BP 161/104 | HR 114 | Temp 98.6°F | Resp 16 | Ht 66.0 in | Wt 144.4 lb

## 2017-12-24 VITALS — BP 125/89 | Temp 99.1°F | Ht 66.0 in | Wt 144.0 lb

## 2017-12-24 DIAGNOSIS — Z800 Family history of malignant neoplasm of digestive organs: Secondary | ICD-10-CM | POA: Insufficient documentation

## 2017-12-24 DIAGNOSIS — Z803 Family history of malignant neoplasm of breast: Secondary | ICD-10-CM | POA: Insufficient documentation

## 2017-12-24 DIAGNOSIS — Z87891 Personal history of nicotine dependence: Secondary | ICD-10-CM | POA: Diagnosis not present

## 2017-12-24 DIAGNOSIS — Z171 Estrogen receptor negative status [ER-]: Secondary | ICD-10-CM | POA: Insufficient documentation

## 2017-12-24 DIAGNOSIS — C50312 Malignant neoplasm of lower-inner quadrant of left female breast: Secondary | ICD-10-CM | POA: Insufficient documentation

## 2017-12-24 MED ORDER — LIDOCAINE-PRILOCAINE 2.5-2.5 % EX CREA: TOPICAL_CREAM | CUTANEOUS | 3 refills | Status: DC

## 2017-12-24 MED ORDER — PROCHLORPERAZINE MALEATE 10 MG PO TABS: 10.0000 mg | ORAL_TABLET | Freq: Four times a day (QID) | ORAL | 2 refills | Status: DC | PRN

## 2017-12-24 MED ORDER — ONDANSETRON HCL 8 MG PO TABS: 8.0000 mg | ORAL_TABLET | Freq: Two times a day (BID) | ORAL | 2 refills | Status: DC | PRN

## 2017-12-24 NOTE — Progress Notes (Signed)
Patient ID: Shelby [name], female   DOB: October 15, 1970, 47 y.o.   MRN: [id number]  Chief Complaint  Patient presents with  . New Patient (Initial Visit)    Left breast cancer- discussion    HPI Shelby [name] is a 47 y.o. female.  This woman was in her usual state of health until earlier this month when she appreciated a mass in the lower inner quadrant of the left breast while bathing.  No history of trauma.  She subsequently underwent evaluation with mammograms and ultrasound followed by core biopsy of the mass as well as an axillary lymph node.  Both of these were positive for invasive mammary carcinoma.  She is seen today to discuss options for management.  She is accompanied by her oldest son as well as her mother.  The patient had appreciated a possible mass in the upper outer quadrant of the right breast in May 2019 mammography and ultrasound at that time was unremarkable.  At that time, there was a discussion of whether she would be a candidate for breast MRI.    The patient reports that she has undergone genetic testing which was negative.  The patient's mother has history of DCIS.     HPI  Past Medical History:  Diagnosis Date  . Anemia    h/o with pregnancy  . Anxiety   . Asthma    allergy induced-no inhaler  . Complication of anesthesia   . Environmental allergies   . Family history of adverse reaction to anesthesia    son-breathing problems-coded 1st time when he was 2 and had another surgery at 57 and had to be admitted for breathing problems  . Family history of breast cancer   . GERD (gastroesophageal reflux disease)   . Headache    migraines  . Hypertension   . Mitral valve disease   . Mitral valve prolapse   . Painful menstrual periods   . PONV (postoperative nausea and vomiting)   . Vertigo     Past Surgical History:  Procedure Laterality Date  . ABDOMINAL HYSTERECTOMY  2010   supracervical   . BUNIONECTOMY Bilateral   . MANDIBLE RECONSTRUCTION      age 1-underbite  . SHOULDER ARTHROSCOPY WITH OPEN ROTATOR CUFF REPAIR Right 04/26/2017   Procedure: SHOULDER ARTHROSCOPY WITH OPEN ROTATOR CUFF REPAIR,SUBACROMINAL DECOMPRESSION;  Surgeon: Thornton Park, MD;  Location: [facility] ORS;  Service: Orthopedics;  Laterality: Right;  . TONSILLECTOMY      Family History  Problem Relation Age of Onset  . Breast cancer Mother 54  . Diabetes Father   . Cancer Maternal Uncle        colon  . Breast cancer Maternal Grandmother 70    Social History Social History   Tobacco Use  . Smoking status: Former Smoker    Packs/day: 0.50    Years: 10.00    Pack years: 5.00    Types: Cigarettes    Last attempt to quit: 04/20/2007    Years since quitting: 10.6  . Smokeless tobacco: Never Used  . Tobacco comment: social smoker back then  Substance Use Topics  . Alcohol use: Yes    Comment: occas  . Drug use: No    Allergies  Allergen Reactions  . Hydrocodone-Acetaminophen Hives    swollen face  . Iodine Hives  . Shellfish Allergy Hives    Current Outpatient Medications  Medication Sig Dispense Refill  . Ascorbic Acid (VITAMIN C PO) Take 1 tablet by mouth daily.     Marland Kitchen  diazepam (VALIUM) 5 MG tablet Take 1 tablet (5 mg total) by mouth every 6 (six) hours as needed for anxiety. 30 tablet 0  . EPINEPHrine 0.3 mg/0.3 mL IJ SOAJ injection Inject 0.3 mLs (0.3 mg total) into the muscle once. 1 Device 12  . Ergocalciferol (VITAMIN D2) 400 units TABS Take 400 Units by mouth at bedtime.     . fluticasone (FLONASE) 50 MCG/ACT nasal spray Place 1 spray into both nostrils daily as needed for allergies or rhinitis.    Marland Kitchen ibuprofen (ADVIL,MOTRIN) 200 MG tablet Take 400 mg by mouth daily as needed for moderate pain.    Marland Kitchen L-Methylfolate 15 MG TABS TAKE 1/2 TABLET (7.5 MG) BY MOUTH DAILY 45 tablet 3  . losartan (COZAAR) 50 MG tablet TAKE 1 TABLET BY MOUTH DAILY 90 tablet 3  . montelukast (SINGULAIR) 10 MG tablet Take 1 tablet (10 mg total) by mouth at bedtime. 30  tablet 12  . Probiotic CAPS Take 1 capsule by mouth at bedtime.    . tretinoin (RETIN-A) 0.1 % cream Apply 1 application topically at bedtime.    . lidocaine-prilocaine (EMLA) cream Apply to affected area once 30 g 3  . ondansetron (ZOFRAN) 8 MG tablet Take 1 tablet (8 mg total) by mouth 2 (two) times daily as needed. 30 tablet 2  . prochlorperazine (COMPAZINE) 10 MG tablet Take 1 tablet (10 mg total) by mouth every 6 (six) hours as needed (Nausea or vomiting). 60 tablet 2   No current facility-administered medications for this visit.     Review of Systems Review of Systems  Constitutional: Negative.   Respiratory: Negative.   Skin: Negative.     Blood pressure (!) 161/104, pulse (!) 114, temperature 98.6 F (37 C), temperature source Temporal, resp. rate 16, height 5' 6"  (1.676 m), weight 144 lb 6.4 oz (65.5 kg), SpO2 98 %.  Physical Exam Physical Exam  Constitutional: She is oriented to person, place, and time. She appears well-developed and well-nourished.  Eyes: Conjunctivae are normal. No scleral icterus.  Neck: Normal range of motion.  Cardiovascular: Normal rate, regular rhythm and normal heart sounds.  Pulmonary/Chest: Effort normal and breath sounds normal. Right breast exhibits no inverted nipple, no mass, no nipple discharge, no skin change and no tenderness. Left breast exhibits no inverted nipple, no mass, no nipple discharge, no skin change and no tenderness.    Lymphadenopathy:    She has no cervical adenopathy.    She has axillary adenopathy (left axilla).       Right: No supraclavicular adenopathy present.       Left: No supraclavicular adenopathy present.  Neurological: She is alert and oriented to person, place, and time.  Skin: Skin is warm and dry.    Data Reviewed Mammograms of May 2019 through November 2019 were reviewed.  Ultrasound of December 17, 2017 reviewed.  Pathology of December 20, 2017 showed invasive mammary carcinoma.  ER/PR negative.   HER-2/neu negative.  Ki-67 90%.  Both the breast and axilla were positive.  Assessment    Clinical stage II carcinoma of the left breast, triple negative.    Plan    The patient was very emotional during the beginning of the visit, going through about a box of tissues before the clinical exam.  After the exam was completed I had a chance to sit down with the patient, her oldest son and her mother.  The patient reported that she wanted to have surgery, to have the breast removed and to  have it gone.  We stepped back from the ledge to review options for management, indications for surgical intervention and the potential benefit (with no clear survival benefit) of neoadjuvant chemotherapy.  We spent the better part of an hour, the majority of which was reviewing options and encouraging her to step back from the knee-jerk response of mastectomy (which I anticipate she will still likely choose especially considering her small breast volume) but to take time to make a good decision for herself.  She is very concerned about her 60 year old son, as his father is an alcoholic and she does not want him to be living with him if she were to become incapacitated.  We reviewed the fact that she should not stop her regular activities, as she enjoys going to the gym and at this time there is no reason that she should put her plans to work on her [facility] [employer] on hold.  While she may not be able to do her regular job at the [employer], that does not mean that she needs to set all of her academic work aside.  By the end of the visit she was amenable and I think better prepared for her upcoming appointment this afternoon with Dr. Grayland Ormond and medical oncology.  The patient reports a dear friend just recently completed treatment for breast cancer through a physician at [facility], and she was considering going there for a second opinion.  She was encouraged to do so and to take the time now to get her "ducks in a row"  so that she can more comfortably participate in her care.  Website information was provided for review, but it was emphasized that this is only if it is helping her with her decision process.  If she does choose to have chemotherapy, considering the poor veins on her upper extremities I think she will be well served by a port.  The procedure was reviewed.  Risks including bleeding, vascular and pulmonary injuries were discussed.  The patient will contact the office if we can be of assistance.        Shelby [name] 12/24/2017, 8:54 PM

## 2017-12-24 NOTE — Progress Notes (Signed)
START ON PATHWAY REGIMEN - Breast     A cycle is every 14 days (cycles 1-4):     Doxorubicin      Cyclophosphamide      Pegfilgrastim-xxxx    A cycle is every 21 days (cycles 5-8):     Paclitaxel      Carboplatin   **Always confirm dose/schedule in your pharmacy ordering system**  Patient Characteristics: Preoperative or Nonsurgical Candidate (Clinical Staging), Neoadjuvant Therapy followed by Surgery, Invasive Disease, Chemotherapy, HER2 Negative/Unknown/Equivocal, ER Negative/Unknown, Platinum Therapy Indicated Therapeutic Status: Preoperative or Nonsurgical Candidate (Clinical Staging) AJCC M Category: cM0 AJCC Grade: G3 Breast Surgical Plan: Neoadjuvant Therapy followed by Surgery ER Status: Negative (-) AJCC 8 Stage Grouping: IIIB HER2 Status: Negative (-) AJCC T Category: cT2 AJCC N Category: cN1 PR Status: Negative (-) Type of Therapy: Platinum Therapy Indicated Intent of Therapy: Curative Intent, Discussed with Patient 

## 2017-12-24 NOTE — Progress Notes (Signed)
Patient is here for a consult for invasive ductal carcinoma on left breast. Patient stated that she had left breast pain since she had her biopsy done. Patient has multiple questions to ask.

## 2017-12-24 NOTE — Patient Instructions (Signed)
Doxorubicin injection What is this medicine? DOXORUBICIN (dox oh ROO bi sin) is a chemotherapy drug. It is used to treat many kinds of cancer like leukemia, lymphoma, neuroblastoma, sarcoma, and Wilms' tumor. It is also used to treat bladder cancer, breast cancer, lung cancer, ovarian cancer, stomach cancer, and thyroid cancer. This medicine may be used for other purposes; ask your health care provider or pharmacist if you have questions. COMMON BRAND NAME(S): Adriamycin, Adriamycin PFS, Adriamycin RDF, Rubex What should I tell my health care provider before I take this medicine? They need to know if you have any of these conditions: -heart disease -history of low blood counts caused by a medicine -liver disease -recent or ongoing radiation therapy -an unusual or allergic reaction to doxorubicin, other chemotherapy agents, other medicines, foods, dyes, or preservatives -pregnant or trying to get pregnant -breast-feeding How should I use this medicine? This drug is given as an infusion into a vein. It is administered in a hospital or clinic by a specially trained health care professional. If you have pain, swelling, burning or any unusual feeling around the site of your injection, tell your health care professional right away. Talk to your pediatrician regarding the use of this medicine in children. Special care may be needed. Overdosage: If you think you have taken too much of this medicine contact a poison control center or emergency room at once. NOTE: This medicine is only for you. Do not share this medicine with others. What if I miss a dose? It is important not to miss your dose. Call your doctor or health care professional if you are unable to keep an appointment. What may interact with this medicine? This medicine may interact with the following medications: -6-mercaptopurine -paclitaxel -phenytoin -St. John's Wort -trastuzumab -verapamil This list may not describe all possible  interactions. Give your health care provider a list of all the medicines, herbs, non-prescription drugs, or dietary supplements you use. Also tell them if you smoke, drink alcohol, or use illegal drugs. Some items may interact with your medicine. What should I watch for while using this medicine? This drug may make you feel generally unwell. This is not uncommon, as chemotherapy can affect healthy cells as well as cancer cells. Report any side effects. Continue your course of treatment even though you feel ill unless your doctor tells you to stop. There is a maximum amount of this medicine you should receive throughout your life. The amount depends on the medical condition being treated and your overall health. Your doctor will watch how much of this medicine you receive in your lifetime. Tell your doctor if you have taken this medicine before. You may need blood work done while you are taking this medicine. Your urine may turn red for a few days after your dose. This is not blood. If your urine is dark or brown, call your doctor. In some cases, you may be given additional medicines to help with side effects. Follow all directions for their use. Call your doctor or health care professional for advice if you get a fever, chills or sore throat, or other symptoms of a cold or flu. Do not treat yourself. This drug decreases your body's ability to fight infections. Try to avoid being around people who are sick. This medicine may increase your risk to bruise or bleed. Call your doctor or health care professional if you notice any unusual bleeding. Talk to your doctor about your risk of cancer. You may be more at risk for certain   types of cancers if you take this medicine. Do not become pregnant while taking this medicine or for 6 months after stopping it. Women should inform their doctor if they wish to become pregnant or think they might be pregnant. Men should not father a child while taking this medicine and  for 6 months after stopping it. There is a potential for serious side effects to an unborn child. Talk to your health care professional or pharmacist for more information. Do not breast-feed an infant while taking this medicine. This medicine has caused ovarian failure in some women and reduced sperm counts in some men This medicine may interfere with the ability to have a child. Talk with your doctor or health care professional if you are concerned about your fertility. What side effects may I notice from receiving this medicine? Side effects that you should report to your doctor or health care professional as soon as possible: -allergic reactions like skin rash, itching or hives, swelling of the face, lips, or tongue -breathing problems -chest pain -fast or irregular heartbeat -low blood counts - this medicine may decrease the number of white blood cells, red blood cells and platelets. You may be at increased risk for infections and bleeding. -pain, redness, or irritation at site where injected -signs of infection - fever or chills, cough, sore throat, pain or difficulty passing urine -signs of decreased platelets or bleeding - bruising, pinpoint red spots on the skin, black, tarry stools, blood in the urine -swelling of the ankles, feet, hands -tiredness -weakness Side effects that usually do not require medical attention (report to your doctor or health care professional if they continue or are bothersome): -diarrhea -hair loss -mouth sores -nail discoloration or damage -nausea -red colored urine -vomiting This list may not describe all possible side effects. Call your doctor for medical advice about side effects. You may report side effects to FDA at 1-800-FDA-1088. Where should I keep my medicine? This drug is given in a hospital or clinic and will not be stored at home. NOTE: This sheet is a summary. It may not cover all possible information. If you have questions about this  medicine, talk to your doctor, pharmacist, or health care provider.  2018 Elsevier/Gold Standard (2015-03-15 11:28:51) Cyclophosphamide injection What is this medicine? CYCLOPHOSPHAMIDE (sye kloe FOSS fa mide) is a chemotherapy drug. It slows the growth of cancer cells. This medicine is used to treat many types of cancer like lymphoma, myeloma, leukemia, breast cancer, and ovarian cancer, to name a few. This medicine may be used for other purposes; ask your health care provider or pharmacist if you have questions. COMMON BRAND NAME(S): Cytoxan, Neosar What should I tell my health care provider before I take this medicine? They need to know if you have any of these conditions: -blood disorders -history of other chemotherapy -infection -kidney disease -liver disease -recent or ongoing radiation therapy -tumors in the bone marrow -an unusual or allergic reaction to cyclophosphamide, other chemotherapy, other medicines, foods, dyes, or preservatives -pregnant or trying to get pregnant -breast-feeding How should I use this medicine? This drug is usually given as an injection into a vein or muscle or by infusion into a vein. It is administered in a hospital or clinic by a specially trained health care professional. Talk to your pediatrician regarding the use of this medicine in children. Special care may be needed. Overdosage: If you think you have taken too much of this medicine contact a poison control center or emergency room at   once. NOTE: This medicine is only for you. Do not share this medicine with others. What if I miss a dose? It is important not to miss your dose. Call your doctor or health care professional if you are unable to keep an appointment. What may interact with this medicine? This medicine may interact with the following medications: -amiodarone -amphotericin B -azathioprine -certain antiviral medicines for HIV or AIDS such as protease inhibitors (e.g., indinavir,  ritonavir) and zidovudine -certain blood pressure medications such as benazepril, captopril, enalapril, fosinopril, lisinopril, moexipril, monopril, perindopril, quinapril, ramipril, trandolapril -certain cancer medications such as anthracyclines (e.g., daunorubicin, doxorubicin), busulfan, cytarabine, paclitaxel, pentostatin, tamoxifen, trastuzumab -certain diuretics such as chlorothiazide, chlorthalidone, hydrochlorothiazide, indapamide, metolazone -certain medicines that treat or prevent blood clots like warfarin -certain muscle relaxants such as succinylcholine -cyclosporine -etanercept -indomethacin -medicines to increase blood counts like filgrastim, pegfilgrastim, sargramostim -medicines used as general anesthesia -metronidazole -natalizumab This list may not describe all possible interactions. Give your health care provider a list of all the medicines, herbs, non-prescription drugs, or dietary supplements you use. Also tell them if you smoke, drink alcohol, or use illegal drugs. Some items may interact with your medicine. What should I watch for while using this medicine? Visit your doctor for checks on your progress. This drug may make you feel generally unwell. This is not uncommon, as chemotherapy can affect healthy cells as well as cancer cells. Report any side effects. Continue your course of treatment even though you feel ill unless your doctor tells you to stop. Drink water or other fluids as directed. Urinate often, even at night. In some cases, you may be given additional medicines to help with side effects. Follow all directions for their use. Call your doctor or health care professional for advice if you get a fever, chills or sore throat, or other symptoms of a cold or flu. Do not treat yourself. This drug decreases your body's ability to fight infections. Try to avoid being around people who are sick. This medicine may increase your risk to bruise or bleed. Call your doctor or  health care professional if you notice any unusual bleeding. Be careful brushing and flossing your teeth or using a toothpick because you may get an infection or bleed more easily. If you have any dental work done, tell your dentist you are receiving this medicine. You may get drowsy or dizzy. Do not drive, use machinery, or do anything that needs mental alertness until you know how this medicine affects you. Do not become pregnant while taking this medicine or for 1 year after stopping it. Women should inform their doctor if they wish to become pregnant or think they might be pregnant. Men should not father a child while taking this medicine and for 4 months after stopping it. There is a potential for serious side effects to an unborn child. Talk to your health care professional or pharmacist for more information. Do not breast-feed an infant while taking this medicine. This medicine may interfere with the ability to have a child. This medicine has caused ovarian failure in some women. This medicine has caused reduced sperm counts in some men. You should talk with your doctor or health care professional if you are concerned about your fertility. If you are going to have surgery, tell your doctor or health care professional that you have taken this medicine. What side effects may I notice from receiving this medicine? Side effects that you should report to your doctor or health care professional as   soon as possible: -allergic reactions like skin rash, itching or hives, swelling of the face, lips, or tongue -low blood counts - this medicine may decrease the number of white blood cells, red blood cells and platelets. You may be at increased risk for infections and bleeding. -signs of infection - fever or chills, cough, sore throat, pain or difficulty passing urine -signs of decreased platelets or bleeding - bruising, pinpoint red spots on the skin, black, tarry stools, blood in the urine -signs of  decreased red blood cells - unusually weak or tired, fainting spells, lightheadedness -breathing problems -dark urine -dizziness -palpitations -swelling of the ankles, feet, hands -trouble passing urine or change in the amount of urine -weight gain -yellowing of the eyes or skin Side effects that usually do not require medical attention (report to your doctor or health care professional if they continue or are bothersome): -changes in nail or skin color -hair loss -missed menstrual periods -mouth sores -nausea, vomiting This list may not describe all possible side effects. Call your doctor for medical advice about side effects. You may report side effects to FDA at 1-800-FDA-1088. Where should I keep my medicine? This drug is given in a hospital or clinic and will not be stored at home. NOTE: This sheet is a summary. It may not cover all possible information. If you have questions about this medicine, talk to your doctor, pharmacist, or health care provider.  2018 Elsevier/Gold Standard (2011-12-01 16:22:58) Paclitaxel injection What is this medicine? PACLITAXEL (PAK li TAX el) is a chemotherapy drug. It targets fast dividing cells, like cancer cells, and causes these cells to die. This medicine is used to treat ovarian cancer, breast cancer, and other cancers. This medicine may be used for other purposes; ask your health care provider or pharmacist if you have questions. COMMON BRAND NAME(S): Onxol, Taxol What should I tell my health care provider before I take this medicine? They need to know if you have any of these conditions: -blood disorders -irregular heartbeat -infection (especially a virus infection such as chickenpox, cold sores, or herpes) -liver disease -previous or ongoing radiation therapy -an unusual or allergic reaction to paclitaxel, alcohol, polyoxyethylated castor oil, other chemotherapy agents, other medicines, foods, dyes, or preservatives -pregnant or trying to  get pregnant -breast-feeding How should I use this medicine? This drug is given as an infusion into a vein. It is administered in a hospital or clinic by a specially trained health care professional. Talk to your pediatrician regarding the use of this medicine in children. Special care may be needed. Overdosage: If you think you have taken too much of this medicine contact a poison control center or emergency room at once. NOTE: This medicine is only for you. Do not share this medicine with others. What if I miss a dose? It is important not to miss your dose. Call your doctor or health care professional if you are unable to keep an appointment. What may interact with this medicine? Do not take this medicine with any of the following medications: -disulfiram -metronidazole This medicine may also interact with the following medications: -cyclosporine -diazepam -ketoconazole -medicines to increase blood counts like filgrastim, pegfilgrastim, sargramostim -other chemotherapy drugs like cisplatin, doxorubicin, epirubicin, etoposide, teniposide, vincristine -quinidine -testosterone -vaccines -verapamil Talk to your doctor or health care professional before taking any of these medicines: -acetaminophen -aspirin -ibuprofen -ketoprofen -naproxen This list may not describe all possible interactions. Give your health care provider a list of all the medicines, herbs, non-prescription drugs, or   dietary supplements you use. Also tell them if you smoke, drink alcohol, or use illegal drugs. Some items may interact with your medicine. What should I watch for while using this medicine? Your condition will be monitored carefully while you are receiving this medicine. You will need important blood work done while you are taking this medicine. This medicine can cause serious allergic reactions. To reduce your risk you will need to take other medicine(s) before treatment with this medicine. If you  experience allergic reactions like skin rash, itching or hives, swelling of the face, lips, or tongue, tell your doctor or health care professional right away. In some cases, you may be given additional medicines to help with side effects. Follow all directions for their use. This drug may make you feel generally unwell. This is not uncommon, as chemotherapy can affect healthy cells as well as cancer cells. Report any side effects. Continue your course of treatment even though you feel ill unless your doctor tells you to stop. Call your doctor or health care professional for advice if you get a fever, chills or sore throat, or other symptoms of a cold or flu. Do not treat yourself. This drug decreases your body's ability to fight infections. Try to avoid being around people who are sick. This medicine may increase your risk to bruise or bleed. Call your doctor or health care professional if you notice any unusual bleeding. Be careful brushing and flossing your teeth or using a toothpick because you may get an infection or bleed more easily. If you have any dental work done, tell your dentist you are receiving this medicine. Avoid taking products that contain aspirin, acetaminophen, ibuprofen, naproxen, or ketoprofen unless instructed by your doctor. These medicines may hide a fever. Do not become pregnant while taking this medicine. Women should inform their doctor if they wish to become pregnant or think they might be pregnant. There is a potential for serious side effects to an unborn child. Talk to your health care professional or pharmacist for more information. Do not breast-feed an infant while taking this medicine. Men are advised not to father a child while receiving this medicine. This product may contain alcohol. Ask your pharmacist or healthcare provider if this medicine contains alcohol. Be sure to tell all healthcare providers you are taking this medicine. Certain medicines, like metronidazole  and disulfiram, can cause an unpleasant reaction when taken with alcohol. The reaction includes flushing, headache, nausea, vomiting, sweating, and increased thirst. The reaction can last from 30 minutes to several hours. What side effects may I notice from receiving this medicine? Side effects that you should report to your doctor or health care professional as soon as possible: -allergic reactions like skin rash, itching or hives, swelling of the face, lips, or tongue -low blood counts - This drug may decrease the number of white blood cells, red blood cells and platelets. You may be at increased risk for infections and bleeding. -signs of infection - fever or chills, cough, sore throat, pain or difficulty passing urine -signs of decreased platelets or bleeding - bruising, pinpoint red spots on the skin, black, tarry stools, nosebleeds -signs of decreased red blood cells - unusually weak or tired, fainting spells, lightheadedness -breathing problems -chest pain -high or low blood pressure -mouth sores -nausea and vomiting -pain, swelling, redness or irritation at the injection site -pain, tingling, numbness in the hands or feet -slow or irregular heartbeat -swelling of the ankle, feet, hands Side effects that usually do not   require medical attention (report to your doctor or health care professional if they continue or are bothersome): -bone pain -complete hair loss including hair on your head, underarms, pubic hair, eyebrows, and eyelashes -changes in the color of fingernails -diarrhea -loosening of the fingernails -loss of appetite -muscle or joint pain -red flush to skin -sweating This list may not describe all possible side effects. Call your doctor for medical advice about side effects. You may report side effects to FDA at 1-800-FDA-1088. Where should I keep my medicine? This drug is given in a hospital or clinic and will not be stored at home. NOTE: This sheet is a summary. It  may not cover all possible information. If you have questions about this medicine, talk to your doctor, pharmacist, or health care provider.  2018 Elsevier/Gold Standard (2014-11-17 19:58:00)  

## 2017-12-24 NOTE — Progress Notes (Signed)
  Oncology Nurse Navigator Documentation  Navigator Location: [facility]Med Onc (12/24/17 1700)   )Navigator Encounter Type: Initial MedOnc (12/24/17 1700)   Abnormal Finding Date: 12/17/17 (12/24/17 1700) Confirmed Diagnosis Date: 12/20/17 (12/24/17 1700)               Patient Visit Type: Initial (12/24/17 1700) Treatment Phase: Pre-Tx/Tx Discussion (12/24/17 1700) Barriers/Navigation Needs: Education;Family concerns;Coordination of Care (12/24/17 1700) Education: Coping with Diagnosis/ Prognosis;Newly Diagnosed Cancer Education;Preparing for Upcoming Surgery/ Treatment (12/24/17 1700) Interventions: Coordination of Care;Psycho-social support (12/24/17 1700)   Coordination of Care: Appts (12/24/17 1700)                  Time Spent with Patient: 45 (12/24/17 1700)   Met patient and family at Med/Onc visit.  Patient very anxious.  Reviewed appointments, and treatment plan.  Given Breast Cancer Treatment Handbook/folder with hospital services.  Support given. Patient states she had  negative BRCA testing several year ago.  Dicsussed with Dr. Grayland Ormond, and will try to obtain records ,and request further testing if needed.

## 2017-12-24 NOTE — Progress Notes (Signed)
Per patient, she would like 2nd opinion. Order placed. Ok per Dr. [name].

## 2017-12-25 ENCOUNTER — Inpatient Hospital Stay: Payer: No Typology Code available for payment source

## 2017-12-25 ENCOUNTER — Telehealth: Payer: Self-pay | Admitting: General Surgery

## 2017-12-25 ENCOUNTER — Telehealth: Payer: Self-pay | Admitting: *Deleted

## 2017-12-25 ENCOUNTER — Other Ambulatory Visit: Payer: Self-pay | Admitting: General Surgery

## 2017-12-25 ENCOUNTER — Other Ambulatory Visit: Payer: Self-pay | Admitting: Oncology

## 2017-12-25 ENCOUNTER — Telehealth: Payer: Self-pay

## 2017-12-25 DIAGNOSIS — C50312 Malignant neoplasm of lower-inner quadrant of left female breast: Secondary | ICD-10-CM

## 2017-12-25 NOTE — Telephone Encounter (Signed)
Case reviewed at Breast tumor conference last night.  Discussed w/ Dr. Grayland Ormond. Patient for neo-adjuvant chemotherapy based on triple negative status. Confirmed by phone this AM patient is "on board" with chemotherapy. We had discussed indication for power port during yesterday's appointment.  Will schedule for early next week.

## 2017-12-25 NOTE — Telephone Encounter (Signed)
Call to patient to discuss port placement surgery. The patient is scheduled for surgery at [facility] with Dr Bary Castilla on 12/31/17. We will call her about pre admit testing. She is aware of date and instructions.

## 2017-12-25 NOTE — Telephone Encounter (Signed)
Patient requesting something for her anxiety that she can take and still work, she does not want a benzo or anything that makes her groggy. Please advise, she is aware she may not get an answer until Monday

## 2017-12-25 NOTE — Telephone Encounter (Signed)
Thank you for your help and your kindness.

## 2017-12-26 ENCOUNTER — Other Ambulatory Visit: Payer: Self-pay

## 2017-12-26 ENCOUNTER — Other Ambulatory Visit: Payer: Self-pay | Admitting: *Deleted

## 2017-12-26 ENCOUNTER — Encounter
Admission: RE | Admit: 2017-12-26 | Discharge: 2017-12-26 | Disposition: A | Discharge status: Home / Self Care | Payer: No Typology Code available for payment source | Source: Ambulatory Visit | Attending: General Surgery | Admitting: General Surgery

## 2017-12-26 ENCOUNTER — Telehealth: Payer: Self-pay | Admitting: *Deleted

## 2017-12-26 MED ORDER — ALPRAZOLAM 0.25 MG PO TABS: 0.2500 mg | ORAL_TABLET | Freq: Two times a day (BID) | ORAL | 0 refills | Status: DC | PRN

## 2017-12-26 NOTE — Telephone Encounter (Signed)
I know she doesn't want a benzo, but her anxiety level is pretty high.  I would still try xanax 0.25mg  BID prn.  She can start it at night and the use during the day sparingly depending on her grogginess.  Thanks.

## 2017-12-26 NOTE — Telephone Encounter (Signed)
Xanax 0.25 sent to [facility] for approval. Thanks

## 2017-12-26 NOTE — Telephone Encounter (Signed)
Patient's surgery has been scheduled for 12-31-17 at [facility] with Dr. Bary Castilla.   The patient is aware she will be contacted by the [facility] to complete a phone interview this afternoon.   Patient aware to call [facility] on Friday between 1 and 3 pm to get her arrival time for surgery.   She is aware to have a driver and be NPO after midnight.   The patient is aware to call the office should she have further questions.

## 2017-12-26 NOTE — Patient Instructions (Signed)
Your procedure is scheduled on:12/31/17 Report to Day Surgery. [facility]OND [facility]LO[facility] To find out your arrival time please call [phone] between 1PM - 3PM on 12/28/17  Remember: Instructions that are not followed completely may result in serious medical risk,  up to and including death, or upon the discretion of your surgeon and anesthesiologist your  surgery may need to be rescheduled.     _X__ 1. Do not eat food after midnight the night before your procedure.                 No gum chewing or hard candies. You may drink clear liquids up to 2 hours                 before you are scheduled to arrive for your surgery- DO not drink clear                 liquids within 2 hours of the start of your surgery.                 Clear Liquids include:  water, apple juice without pulp, clear carbohydrate                 drink such as Clearfast of Gatorade, Black Coffee or Tea (Do not add                 anything to coffee or tea).  __X__2.  On the morning of surgery brush your teeth with toothpaste and water, you                may rinse your mouth with mouthwash if you wish.  Do not swallow any toothpaste of mouthwash.     _X__ 3.  No Alcohol for 24 hours before or after surgery.   _X__ 4.  Do Not Smoke or use e-cigarettes For 24 Hours Prior to Your Surgery.                 Do not use any chewable tobacco products for at least 6 hours prior to                 surgery.  ____  5.  Bring all medications with you on the day of surgery if instructed.   __X__  6.  Notify your doctor if there is any change in your medical condition      (cold, fever, infections).     Do not wear jewelry, make-up, hairpins, clips or nail polish. Do not wear lotions, powders, or perfumes. You may wear deodorant. Do not shave 48 hours prior to surgery. Men may shave face and neck. Do not bring valuables to the hospital.    [facility] is not responsible for any belongings or  valuables.  Contacts, dentures or bridgework may not be worn into surgery. Leave your suitcase in the car. After surgery it may be brought to your room. For patients admitted to the hospital, discharge time is determined by your treatment team.   Patients discharged the day of surgery will not be allowed to drive home.     _X___ Take these medicines the morning of surgery with A SIP OF WATER:    1. DIAZEPAM  2. PEPCID BEDTIME 12/30/17 AND AM OF SURGERY  3. L-METHYLFOLATE  4.  5.  6.  ____ Fleet Enema (as directed)   ____ Use CHG Soap as directed  ____ Use inhalers on the day of surgery  ____ Stop metformin  2 days prior to surgery    ____ Take 1/2 of usual insulin dose the night before surgery. No insulin the morning          of surgery.   ____ Stop Coumadin/Plavix/aspirin on  ___X_ Stop Anti-inflammatories on       IBUPROFEN-----CAN TAKE DOSE OR TWO IF NECESSARY FOR BAD HEADACHE   ____ Stop supplements until after surgery.    ____ Bring C-Pap to the hospital.

## 2017-12-30 NOTE — Progress Notes (Signed)
[place]  [phone]) [phone] [phone]) [phone]  ID: Shelby [name] OB: 06-05-1970  MR#: [id number]  XTG#:[id number]  Patient Care Team: Jerrol Banana., MD as PCP - General (Family Medicine) [name], MD as Consulting Physician (Cardiology)  CHIEF COMPLAINT: Clinical stage IIIB triple negative invasive carcinoma of the lower inner quadrant of the left breast.  INTERVAL HISTORY: Patient returns to clinic today for further evaluation and initiation of cycle 1 of dose dense Adriamycin and Cytoxan.  She continues to be highly anxious, but otherwise feels well.  She has no neurologic complaints.  She denies any recent fevers or illnesses.  She has a good appetite and denies weight loss.  She has no chest pain or shortness of breath.  She denies any nausea, vomiting, constipation, or diarrhea.  She has no urinary complaints.  Patient offers no further specific complaints today.  REVIEW OF SYSTEMS:   Review of Systems  Constitutional: Negative.  Negative for fever, malaise/fatigue and weight loss.  Respiratory: Negative.  Negative for cough, hemoptysis and shortness of breath.   Cardiovascular: Negative.  Negative for chest pain and leg swelling.  Gastrointestinal: Negative.  Negative for abdominal pain and melena.  Genitourinary: Negative.  Negative for dysuria.  Musculoskeletal: Negative.  Negative for back pain.  Skin: Negative.  Negative for rash.  Neurological: Negative.  Negative for dizziness, focal weakness and weakness.  Psychiatric/Behavioral: The patient is nervous/anxious.     As per HPI. Otherwise, a complete review of systems is negative.  PAST MEDICAL HISTORY: Past Medical History:  Diagnosis Date  . Anemia    h/o with pregnancy  . Anxiety   . Asthma    allergy induced-no inhaler  . Cancer ([place])   . Complication of anesthesia   . Environmental allergies   . Family history of adverse reaction to anesthesia    son-breathing  problems-coded 1st time when he was 2 and had another surgery at 84 and had to be admitted for breathing problems  . Family history of breast cancer   . GERD (gastroesophageal reflux disease)   . Headache    migraines  . Hypertension   . Mitral valve disease   . Mitral valve prolapse   . Painful menstrual periods   . PONV (postoperative nausea and vomiting)   . Vertigo     PAST SURGICAL HISTORY: Past Surgical History:  Procedure Laterality Date  . ABDOMINAL HYSTERECTOMY  2010   supracervical   . BUNIONECTOMY Bilateral   . MANDIBLE RECONSTRUCTION     age 87-underbite  . PORTACATH PLACEMENT Right 12/31/2017   Procedure: INSERTION PORT-A-CATH;  Surgeon: Robert Bellow, MD;  Location: [facility] ORS;  Service: General;  Laterality: Right;  . SHOULDER ARTHROSCOPY WITH OPEN ROTATOR CUFF REPAIR Right 04/26/2017   Procedure: SHOULDER ARTHROSCOPY WITH OPEN ROTATOR CUFF REPAIR,SUBACROMINAL DECOMPRESSION;  Surgeon: Thornton Park, MD;  Location: [facility] ORS;  Service: Orthopedics;  Laterality: Right;  . TONSILLECTOMY      FAMILY HISTORY: Family History  Problem Relation Age of Onset  . Breast cancer Mother 41  . Diabetes Father   . Cancer Maternal Uncle        colon  . Breast cancer Maternal Grandmother 6    ADVANCED DIRECTIVES (Y/N):  N  HEALTH MAINTENANCE: Social History   Tobacco Use  . Smoking status: Former Smoker    Packs/day: 0.50    Years: 10.00    Pack years: 5.00    Types: Cigarettes    Last attempt  to quit: 04/20/2007    Years since quitting: 10.7  . Smokeless tobacco: Never Used  . Tobacco comment: social smoker back then  Substance Use Topics  . Alcohol use: Yes    Comment: occas  . Drug use: No     Colonoscopy:  PAP:  Bone density:  Lipid panel:  Allergies  Allergen Reactions  . Hydrocodone-Acetaminophen Hives    swollen face  . Iodine Hives  . Peanut Butter Flavor Other (See Comments)    Made mouth tingle  . Shellfish Allergy Hives    Current  Outpatient Medications  Medication Sig Dispense Refill  . ALPRAZolam (XANAX) 0.25 MG tablet Take 1 tablet (0.25 mg total) by mouth 2 (two) times daily as needed for anxiety. 60 tablet 0  . Ascorbic Acid (VITAMIN C PO) Take 1 tablet by mouth daily.     . Cholecalciferol (VITAMIN D) 50 MCG (2000 UT) CAPS Take 2,000 Units by mouth daily.    . diazepam (VALIUM) 5 MG tablet Take 1 tablet (5 mg total) by mouth every 6 (six) hours as needed for anxiety. 30 tablet 0  . EPINEPHrine 0.3 mg/0.3 mL IJ SOAJ injection Inject 0.3 mLs (0.3 mg total) into the muscle once. 1 Device 12  . famotidine (PEPCID) 20 MG tablet   0  . fluticasone (FLONASE) 50 MCG/ACT nasal spray Place 1 spray into both nostrils daily as needed for allergies or rhinitis.    Marland Kitchen ibuprofen (ADVIL,MOTRIN) 200 MG tablet Take 400 mg by mouth daily as needed for moderate pain.    Marland Kitchen L-Methylfolate 15 MG TABS TAKE 1/2 TABLET (7.5 MG) BY MOUTH DAILY (Patient taking differently: Take 7.5 mg by mouth daily. ) 45 tablet 3  . lidocaine-prilocaine (EMLA) cream Apply to affected area once 30 g 3  . losartan (COZAAR) 50 MG tablet TAKE 1 TABLET BY MOUTH DAILY 90 tablet 3  . montelukast (SINGULAIR) 10 MG tablet Take 1 tablet (10 mg total) by mouth at bedtime. (Patient taking differently: Take 10 mg by mouth daily as needed (allergies). ) 30 tablet 12  . ondansetron (ZOFRAN) 8 MG tablet Take 1 tablet (8 mg total) by mouth 2 (two) times daily as needed. 30 tablet 2  . Probiotic CAPS Take 1 capsule by mouth at bedtime.    . prochlorperazine (COMPAZINE) 10 MG tablet Take 1 tablet (10 mg total) by mouth every 6 (six) hours as needed (Nausea or vomiting). 60 tablet 2  . tretinoin (RETIN-A) 0.1 % cream Apply 1 application topically at bedtime.     No current facility-administered medications for this visit.     OBJECTIVE: Vitals:   01/03/18 0937  BP: (!) 157/94  Pulse: 93  Temp: (!) 97 F (36.1 C)     Body mass index is 25.36 kg/m.    ECOG FS:0 -  Asymptomatic  General: Well-developed, well-nourished, no acute distress. Eyes: Pink conjunctiva, anicteric sclera. HEENT: Normocephalic, moist mucous membranes. Breast: Easily palpable left breast mass.  Exam deferred today. Lungs: Clear to auscultation bilaterally. Heart: Regular rate and rhythm. No rubs, murmurs, or gallops. Abdomen: Soft, nontender, nondistended. No organomegaly noted, normoactive bowel sounds. Musculoskeletal: No edema, cyanosis, or clubbing. Neuro: Alert, answering all questions appropriately. Cranial nerves grossly intact. Skin: No rashes or petechiae noted. Psych: Normal affect.  LAB RESULTS:  Lab Results  Component Value Date   NA 138 08-27-2017   K 3.4 (L) 08-27-2017   CL 100 08-27-2017   CO2 29 08-27-2017   GLUCOSE 116 (H) 08-27-2017  BUN 14 02/25/202019   CREATININE 0.64 02/25/202019   CALCIUM 9.7 02/25/202019   PROT 7.3 02/25/202019   ALBUMIN 4.6 02/25/202019   AST 17 02/25/202019   ALT 11 02/25/202019   ALKPHOS 37 (L) 02/25/202019   BILITOT 0.9 02/25/202019   GFRNONAA >60 02/25/202019   GFRAA >60 02/25/202019    Lab Results  Component Value Date   WBC 6.4 02/25/202019   NEUTROABS 4.5 02/25/202019   HGB 12.5 02/25/202019   HCT 36.8 02/25/202019   MCV 89.1 02/25/202019   PLT 252 02/25/202019     STUDIES: Ct Chest W Contrast  Result Date: 01/02/2018 CLINICAL DATA:  Left-sided breast cancer. Asymptomatic. Prior hysterectomy. EXAM: CT CHEST, ABDOMEN, AND PELVIS WITH CONTRAST TECHNIQUE: Multidetector CT imaging of the chest, abdomen and pelvis was performed following the standard protocol during bolus administration of intravenous contrast. CONTRAST:  157m ISOVUE-300 IOPAMIDOL (ISOVUE-300) INJECTION 61% COMPARISON:  11/25/2012 noncontrast abdominopelvic CT. Chest radiograph 12/31/2017. FINDINGS: CT CHEST FINDINGS Cardiovascular: Right Port-A-Cath tip high right atrium. Normal heart size, without pericardial effusion. No central pulmonary embolism, on this non-dedicated  study. Mediastinum/Nodes: No supraclavicular adenopathy. No subpectoral or axillary adenopathy. No mediastinal or hilar adenopathy. No internal mammary adenopathy. Lungs/Pleura: No pleural fluid. Bibasilar scarring or subsegmental atelectasis. Musculoskeletal: Nodularity within the inferomedial aspect of the left breast/chest wall, including at 1.4 cm on image 41/2, likely representing the primary. No acute osseous abnormality. CT ABDOMEN PELVIS FINDINGS Hepatobiliary: Low-density high right hepatic lobe lesion image 48/2 is felt to be subtly present back in 2014, favoring a benign etiology. Normal gallbladder, without biliary ductal dilatation. Pancreas: Normal, without mass or ductal dilatation. Spleen: Normal in size, without focal abnormality. Adrenals/Urinary Tract: Normal adrenal glands. Normal kidneys, without hydronephrosis. Normal urinary bladder. Stomach/Bowel: Normal stomach, without wall thickening. Normal colon and terminal ileum. Normal small bowel. Vascular/Lymphatic: Normal caliber of the aorta and branch vessels. No abdominopelvic adenopathy. Reproductive: Hysterectomy.  No adnexal mass. Other: No significant free fluid. Mild pelvic floor laxity. No evidence of omental or peritoneal disease. Suspect at least 1 tiny fat containing ventral abdominal wall hernia, including on image 76/2. Musculoskeletal: Tiny sclerotic lesions within the pelvis are most likely bone islands. IMPRESSION: 1. Inferior medial left breast nodularity likely represents the primary lesion. No evidence of metastatic disease in the chest, abdomen, or pelvis. 2. Probable bone islands throughout the pelvis. Recommend attention on follow-up. Electronically Signed   By: KAbigail MiyamotoM.D.   On: 01/02/2018 10:40   Nm Cardiac Muga Rest  Result Date: 01/01/2018 CLINICAL DATA:  Breast cancer. Evaluate cardiac function in relation to chemotherapy. EXAM: NUCLEAR MEDICINE CARDIAC BLOOD POOL IMAGING (MUGA) TECHNIQUE: Cardiac multi-gated  acquisition was performed at rest following intravenous injection of Tc-978mabeled red blood cells. RADIOPHARMACEUTICALS:  21.8 mCi Tc-9970mrtechnetate in-vitro labeled red blood cells IV COMPARISON:  None. FINDINGS: No  focal wall motion abnormality of the left ventricle. Calculated left ventricular ejection fraction equals 71% IMPRESSION: Left ventricular ejection fraction equals71 %. Electronically Signed   By: SteSuzy BouchardD.   On: 01/01/2018 16:14   Ct Abdomen Pelvis W Contrast  Result Date: 01/02/2018 CLINICAL DATA:  Left-sided breast cancer. Asymptomatic. Prior hysterectomy. EXAM: CT CHEST, ABDOMEN, AND PELVIS WITH CONTRAST TECHNIQUE: Multidetector CT imaging of the chest, abdomen and pelvis was performed following the standard protocol during bolus administration of intravenous contrast. CONTRAST:  100m47mOVUE-300 IOPAMIDOL (ISOVUE-300) INJECTION 61% COMPARISON:  11/25/2012 noncontrast abdominopelvic CT. Chest radiograph 12/31/2017. FINDINGS: CT CHEST FINDINGS Cardiovascular: Right Port-A-Cath tip high  right atrium. Normal heart size, without pericardial effusion. No central pulmonary embolism, on this non-dedicated study. Mediastinum/Nodes: No supraclavicular adenopathy. No subpectoral or axillary adenopathy. No mediastinal or hilar adenopathy. No internal mammary adenopathy. Lungs/Pleura: No pleural fluid. Bibasilar scarring or subsegmental atelectasis. Musculoskeletal: Nodularity within the inferomedial aspect of the left breast/chest wall, including at 1.4 cm on image 41/2, likely representing the primary. No acute osseous abnormality. CT ABDOMEN PELVIS FINDINGS Hepatobiliary: Low-density high right hepatic lobe lesion image 48/2 is felt to be subtly present back in 2014, favoring a benign etiology. Normal gallbladder, without biliary ductal dilatation. Pancreas: Normal, without mass or ductal dilatation. Spleen: Normal in size, without focal abnormality. Adrenals/Urinary Tract: Normal  adrenal glands. Normal kidneys, without hydronephrosis. Normal urinary bladder. Stomach/Bowel: Normal stomach, without wall thickening. Normal colon and terminal ileum. Normal small bowel. Vascular/Lymphatic: Normal caliber of the aorta and branch vessels. No abdominopelvic adenopathy. Reproductive: Hysterectomy.  No adnexal mass. Other: No significant free fluid. Mild pelvic floor laxity. No evidence of omental or peritoneal disease. Suspect at least 1 tiny fat containing ventral abdominal wall hernia, including on image 76/2. Musculoskeletal: Tiny sclerotic lesions within the pelvis are most likely bone islands. IMPRESSION: 1. Inferior medial left breast nodularity likely represents the primary lesion. No evidence of metastatic disease in the chest, abdomen, or pelvis. 2. Probable bone islands throughout the pelvis. Recommend attention on follow-up. Electronically Signed   By: Abigail Miyamoto M.D.   On: 01/02/2018 10:40   Dg Chest Port 1 View  Result Date: 12/31/2017 CLINICAL DATA:  Status post Port-A-Cath placement. EXAM: PORTABLE CHEST 1 VIEW COMPARISON:  03/10/2015 FINDINGS: A right subclavian Port-A-Cath has been placed and terminates at the superior cavoatrial junction. The cardiomediastinal silhouette is within normal limits. The lungs are well inflated and clear. No pleural effusion or pneumothorax is identified. No acute osseous abnormality is seen. IMPRESSION: Port-A-Cath as above. No evidence of pneumothorax or acute airspace disease. Electronically Signed   By: Logan Bores M.D.   On: 12/31/2017 10:26   Dg C-arm 1-60 Min-no Report  Result Date: 12/31/2017 Fluoroscopy was utilized by the requesting physician.  No radiographic interpretation.   US Breast Ltd Uni Left Inc Axilla  Result Date: 12/17/2017 CLINICAL DATA:  Painful palpable abnormality in the LEFT breast first noticed 1 week ago. EXAM: DIGITAL DIAGNOSTIC BILATERAL MAMMOGRAM WITH CAD AND TOMO ULTRASOUND LEFT BREAST COMPARISON:   06/29/2017 and earlier ACR Breast Density Category d: The breast tissue is extremely dense, which lowers the sensitivity of mammography. FINDINGS: Right breast: Mammogram: No suspicious mass, distortion, or microcalcifications are identified to suggest presence of malignancy. Mammographic images were processed with CAD. Left breast: Mammogram: On spot tangential view of the LEFT breast there is a partially imaged irregular mass associated with fine linear calcifications. Mammographic images were processed with CAD. Physical Exam: I palpate a discrete firm mass in the 8 o'clock location of the LEFT breast 6 centimeters from nipple. Ultrasound: Targeted ultrasound is performed, showing the palpable abnormality is composed of 3 small masses, measured together to be 2.3 x 2.0 x 0.8 centimeters. These masses are irregular in shape, with irregular margins, and demonstrate internal blood flow on Doppler evaluation. Mixed posterior acoustic features. Evaluation of the LEFT axilla demonstrates a single lymph node with thickened cortex. Lymph node is adjacent to blood vessels but may be amenable to ultrasound-guided biopsy. IMPRESSION: 1. Suspicious palpable mass in the 8 o'clock location of the LEFT breast for which ultrasound-guided core biopsy is indicated. 2. Suspicious LEFT  axillary lymph node for which tissue sampling is recommended. At the time of biopsy, I would recommend assessment of the neighboring blood vessels to determine if there is an appropriate sonographic window for biopsy or ultrasound-guided placement of a HydroMARK clip. RECOMMENDATION: 1. Ultrasound-guided core biopsy of LEFT breast mass 8 o'clock location. 2. Ultrasound-guided core biopsy or HydroMARK clip placement of enlarged LEFT axillary lymph node if possible, see above. I have discussed the findings and recommendations with the patient. Results were also provided in writing at the conclusion of the visit. If applicable, a reminder letter will be  sent to the patient regarding the next appointment. BI-RADS CATEGORY  4: Suspicious. Electronically Signed   By: Nolon Nations M.D.   On: 12/17/2017 16:13   Mm Diag Breast Tomo Bilateral  Result Date: 12/17/2017 CLINICAL DATA:  Painful palpable abnormality in the LEFT breast first noticed 1 week ago. EXAM: DIGITAL DIAGNOSTIC BILATERAL MAMMOGRAM WITH CAD AND TOMO ULTRASOUND LEFT BREAST COMPARISON:  06/29/2017 and earlier ACR Breast Density Category d: The breast tissue is extremely dense, which lowers the sensitivity of mammography. FINDINGS: Right breast: Mammogram: No suspicious mass, distortion, or microcalcifications are identified to suggest presence of malignancy. Mammographic images were processed with CAD. Left breast: Mammogram: On spot tangential view of the LEFT breast there is a partially imaged irregular mass associated with fine linear calcifications. Mammographic images were processed with CAD. Physical Exam: I palpate a discrete firm mass in the 8 o'clock location of the LEFT breast 6 centimeters from nipple. Ultrasound: Targeted ultrasound is performed, showing the palpable abnormality is composed of 3 small masses, measured together to be 2.3 x 2.0 x 0.8 centimeters. These masses are irregular in shape, with irregular margins, and demonstrate internal blood flow on Doppler evaluation. Mixed posterior acoustic features. Evaluation of the LEFT axilla demonstrates a single lymph node with thickened cortex. Lymph node is adjacent to blood vessels but may be amenable to ultrasound-guided biopsy. IMPRESSION: 1. Suspicious palpable mass in the 8 o'clock location of the LEFT breast for which ultrasound-guided core biopsy is indicated. 2. Suspicious LEFT axillary lymph node for which tissue sampling is recommended. At the time of biopsy, I would recommend assessment of the neighboring blood vessels to determine if there is an appropriate sonographic window for biopsy or ultrasound-guided placement of  a HydroMARK clip. RECOMMENDATION: 1. Ultrasound-guided core biopsy of LEFT breast mass 8 o'clock location. 2. Ultrasound-guided core biopsy or HydroMARK clip placement of enlarged LEFT axillary lymph node if possible, see above. I have discussed the findings and recommendations with the patient. Results were also provided in writing at the conclusion of the visit. If applicable, a reminder letter will be sent to the patient regarding the next appointment. BI-RADS CATEGORY  4: Suspicious. Electronically Signed   By: Nolon Nations M.D.   On: 12/17/2017 16:13   [place] Axillary Node Core Biopsy Left  Addendum Date: 12/21/2017   ADDENDUM REPORT: 12/21/2017 12:38 ADDENDUM: Pathology revealed GRADE III INVASIVE DUCTAL CARCINOMA, DUCTAL CARCINOMA IN SITU, LYMPHOVASCULAR INVASION IS IDENTIFIED of the Left breast, 8 o'clock. METASTATIC CARCINOMA IN the Left axillary lymph node. This was found to be concordant by Dr. Everlean Alstrom. Pathology results will discussed with the patient by telephone by Lorelle Gibbs, CNM at [facility] in [place], [place]. Melody [facility] will arrange a surgical referral for the patient. Recommendation for a bilateral breast MRI due to family history and extremely dense breasts. Pathology results reported by Terie Purser, RN on 12/21/2017. Electronically Signed  By: Everlean Alstrom M.D.   On: 12/21/2017 12:38   Result Date: 12/21/2017 CLINICAL DATA:  47 year old female with suspicious cluster of palpable masses in the left breast at the 7 8 o'clock position as well as a suspicious lymph node in the left axilla with cortical thickening. EXAM: ULTRASOUND GUIDED LEFT BREAST CORE NEEDLE BIOPSY ULTRASOUND-GUIDED LEFT AXILLA CORE NEEDLE BIOPSY COMPARISON:  Previous exam(s). FINDINGS: I met with the patient and we discussed the procedure of ultrasound-guided biopsy, including benefits and alternatives. We discussed the high likelihood of a successful procedure. We discussed the risks  of the procedure, including infection, bleeding, tissue injury, clip migration, and inadequate sampling. Informed written consent was given. The usual time-out protocol was performed immediately prior to the procedure. SITE 1: LEFT BREAST 8 O'CLOCK: MASS: Lesion quadrant: LOWER INNER Using sterile technique and 1% Lidocaine as local anesthetic, under direct ultrasound visualization, a 12 gauge spring-loaded device was used to perform biopsy of the largest of the cluster of masses at the 8 o'clock position using a medial to lateral approach. At the conclusion of the procedure a ribbon shaped tissue marker clip was deployed into the biopsy cavity. SITE 2: LEFT AXILLA: LYMPH NODE: Lesion quadrant: UPPER-OUTER Using sterile technique and 1% Lidocaine as local anesthetic, under direct ultrasound visualization, a 14 gauge spring-loaded device was used to perform biopsy of the lymph node in the left axilla using a inferolateral to superior medial approach. At the conclusion of the procedure a spiral shaped HydroMARK tissue marker clip was deployed into the biopsy cavity. Follow up 2 view mammogram was performed and dictated separately. IMPRESSION: 1. Ultrasound-guided biopsy of the palpable mass in the left breast at the 8 o'clock position. 2. Ultrasound-guided biopsy of a lymph node with a thickened cortex in the left axilla. Electronically Signed: By: Everlean Alstrom M.D. On: 12/20/2017 14:38   Mm Clip Placement Left  Result Date: 12/20/2017 CLINICAL DATA:  Post ultrasound-guided biopsy of a suspicious mass at the 8 o'clock position as well as biopsy of a lymph node in the left axilla demonstrating a thickened cortex. EXAM: DIAGNOSTIC LEFT MAMMOGRAM POST ULTRASOUND BIOPSY COMPARISON:  Previous exam(s). FINDINGS: Mammographic images were obtained following ultrasound guided biopsy of a suspicious mass in the left breast at the 8 o'clock position and ultrasound-guided biopsy of a suspicious lymph node in the left  axilla. A ribbon shaped biopsy marking clip is present and felt to be located at the posterior margin of the biopsied mass in the left breast at the 8 o'clock position. Suspicious calcifications are noted to be present 1.5 cm lateral and superior to the biopsied mass in the left breast. The spiral shaped HydroMARK clip is present and located at the anterior margin of the biopsied lymph node in the left axilla. IMPRESSION: 1. Ribbon shaped biopsy marking clip along posterior margin of biopsied mass in the left breast. Suspicious calcifications are noted to be present 1.5 cm lateral and superior to the biopsied mass in the left breast. 2. Spiral shaped HydroMARK clip located at anterior margin of biopsied lymph node in the left axilla. Final Assessment: Post Procedure Mammograms for Marker Placement Electronically Signed   By: Everlean Alstrom M.D.   On: 12/20/2017 14:52   [place] Lt Breast Bx W Loc Dev 1st Lesion Img Bx Spec US Guide  Addendum Date: 12/21/2017   ADDENDUM REPORT: 12/21/2017 12:38 ADDENDUM: Pathology revealed GRADE III INVASIVE DUCTAL CARCINOMA, DUCTAL CARCINOMA IN SITU, LYMPHOVASCULAR INVASION IS IDENTIFIED of the Left breast, 8  o'clock. METASTATIC CARCINOMA IN the Left axillary lymph node. This was found to be concordant by Dr. Everlean Alstrom. Pathology results will discussed with the patient by telephone by Lorelle Gibbs, CNM at [facility] in [place], [place]. Mel[facility] will arrange a surgical referral for the patient. Recommendation for a bilateral breast MRI due to family history and extremely dense breasts. Pathology results reported by Terie Purser, RN on 12/21/2017. Electronically Signed   By: Everlean Alstrom M.D.   On: 12/21/2017 12:38   Result Date: 12/21/2017 CLINICAL DATA:  47 year old female with suspicious cluster of palpable masses in the left breast at the 7 8 o'clock position as well as a suspicious lymph node in the left axilla with cortical thickening. EXAM:  ULTRASOUND GUIDED LEFT BREAST CORE NEEDLE BIOPSY ULTRASOUND-GUIDED LEFT AXILLA CORE NEEDLE BIOPSY COMPARISON:  Previous exam(s). FINDINGS: I met with the patient and we discussed the procedure of ultrasound-guided biopsy, including benefits and alternatives. We discussed the high likelihood of a successful procedure. We discussed the risks of the procedure, including infection, bleeding, tissue injury, clip migration, and inadequate sampling. Informed written consent was given. The usual time-out protocol was performed immediately prior to the procedure. SITE 1: LEFT BREAST 8 O'CLOCK: MASS: Lesion quadrant: LOWER INNER Using sterile technique and 1% Lidocaine as local anesthetic, under direct ultrasound visualization, a 12 gauge spring-loaded device was used to perform biopsy of the largest of the cluster of masses at the 8 o'clock position using a medial to lateral approach. At the conclusion of the procedure a ribbon shaped tissue marker clip was deployed into the biopsy cavity. SITE 2: LEFT AXILLA: LYMPH NODE: Lesion quadrant: UPPER-OUTER Using sterile technique and 1% Lidocaine as local anesthetic, under direct ultrasound visualization, a 14 gauge spring-loaded device was used to perform biopsy of the lymph node in the left axilla using a inferolateral to superior medial approach. At the conclusion of the procedure a spiral shaped HydroMARK tissue marker clip was deployed into the biopsy cavity. Follow up 2 view mammogram was performed and dictated separately. IMPRESSION: 1. Ultrasound-guided biopsy of the palpable mass in the left breast at the 8 o'clock position. 2. Ultrasound-guided biopsy of a lymph node with a thickened cortex in the left axilla. Electronically Signed: By: Everlean Alstrom M.D. On: 12/20/2017 14:38    ASSESSMENT: Clinical stage IIIB triple negative invasive carcinoma of the lower inner quadrant of the left breast.  PLAN:    1. Clinical stage IIIB triple negative invasive carcinoma of  the lower inner quadrant of the left breast: CT scan results from January 01, 2018 did not reveal any metastatic disease.  Given patient's stage of disease, she will benefit from neoadjuvant chemotherapy using Adriamycin, Cytoxan with Neulasta support followed by carboplatinum and Taxol.  Patient has indicated she may prefer to undergo mastectomy, therefore adjuvant XRT may not be needed.  An aromatase inhibitor would not offer any benefit given the triple negative status of her disease.  MUGA scan on January 01, 2018 revealed an EF of 71%.  Proceed with cycle 1 of Adriamycin and Cytoxan.  Return to clinic in 2 days for Udenyca, in 1 week for laboratory work and further evaluation, and then in 2 weeks for consideration of cycle 2.   2.  Anxiety: Continue Xanax as needed.  I spent a total of 30 minutes face-to-face with the patient of which greater than 50% of the visit was spent in counseling and coordination of care as detailed above.  Patient expressed understanding  and was in agreement with this plan. She also understands that She can call clinic at any time with any questions, concerns, or complaints.   Cancer Staging Primary cancer of lower-inner quadrant of left female breast [facility]) Staging form: Breast, AJCC 8th Edition - Clinical stage from 12/24/2017: Stage IIIB (cT2, cN1, cM0, G3, ER-, PR-, HER2-) - Signed by Lloyd Huger, MD on 12/24/2017 - Pathologic: No stage assigned - Unsigned   Lloyd Huger, MD   01/05/2018 7:26 AM

## 2017-12-31 ENCOUNTER — Ambulatory Visit: Payer: No Typology Code available for payment source | Admitting: Anesthesiology

## 2017-12-31 ENCOUNTER — Telehealth: Payer: Self-pay | Admitting: *Deleted

## 2017-12-31 ENCOUNTER — Ambulatory Visit
Admission: RE | Admit: 2017-12-31 | Discharge: 2017-12-31 | Disposition: A | Discharge status: Home / Self Care | Payer: No Typology Code available for payment source | Source: Ambulatory Visit | Attending: General Surgery | Admitting: General Surgery

## 2017-12-31 ENCOUNTER — Encounter: Payer: Self-pay | Admitting: Emergency Medicine

## 2017-12-31 ENCOUNTER — Encounter
Admission: RE | Disposition: A | Discharge status: Home / Self Care | Payer: Self-pay | Source: Ambulatory Visit | Attending: General Surgery

## 2017-12-31 ENCOUNTER — Ambulatory Visit: Payer: No Typology Code available for payment source

## 2017-12-31 ENCOUNTER — Other Ambulatory Visit: Payer: Self-pay

## 2017-12-31 DIAGNOSIS — C50312 Malignant neoplasm of lower-inner quadrant of left female breast: Secondary | ICD-10-CM

## 2017-12-31 DIAGNOSIS — Z87891 Personal history of nicotine dependence: Secondary | ICD-10-CM | POA: Insufficient documentation

## 2017-12-31 DIAGNOSIS — I341 Nonrheumatic mitral (valve) prolapse: Secondary | ICD-10-CM | POA: Diagnosis not present

## 2017-12-31 DIAGNOSIS — J45909 Unspecified asthma, uncomplicated: Secondary | ICD-10-CM | POA: Diagnosis not present

## 2017-12-31 DIAGNOSIS — C50912 Malignant neoplasm of unspecified site of left female breast: Secondary | ICD-10-CM | POA: Insufficient documentation

## 2017-12-31 DIAGNOSIS — F419 Anxiety disorder, unspecified: Secondary | ICD-10-CM | POA: Diagnosis not present

## 2017-12-31 DIAGNOSIS — K219 Gastro-esophageal reflux disease without esophagitis: Secondary | ICD-10-CM | POA: Insufficient documentation

## 2017-12-31 DIAGNOSIS — Z171 Estrogen receptor negative status [ER-]: Secondary | ICD-10-CM | POA: Diagnosis not present

## 2017-12-31 DIAGNOSIS — Z95828 Presence of other vascular implants and grafts: Secondary | ICD-10-CM

## 2017-12-31 DIAGNOSIS — I10 Essential (primary) hypertension: Secondary | ICD-10-CM | POA: Insufficient documentation

## 2017-12-31 HISTORY — PX: PORTACATH PLACEMENT: SHX2246

## 2017-12-31 SURGERY — INSERTION, TUNNELED CENTRAL VENOUS DEVICE, WITH PORT
Anesthesia: General | Laterality: Right

## 2017-12-31 MED ORDER — CEFAZOLIN SODIUM-DEXTROSE 2-4 GM/100ML-% IV SOLN
2.0000 g | INTRAVENOUS | Status: AC
  Administered 2017-12-31: 2 g via INTRAVENOUS

## 2017-12-31 MED ORDER — MIDAZOLAM HCL 2 MG/2ML IJ SOLN
INTRAMUSCULAR | Status: DC | PRN
  Administered 2017-12-31: 2 mg via INTRAVENOUS

## 2017-12-31 MED ORDER — FENTANYL CITRATE (PF) 100 MCG/2ML IJ SOLN
25.0000 ug | INTRAMUSCULAR | Status: DC | PRN
  Administered 2017-12-31: 50 ug via INTRAVENOUS
  Administered 2017-12-31: 25 ug via INTRAVENOUS

## 2017-12-31 MED ORDER — SODIUM CHLORIDE (PF) 0.9 % IJ SOLN
INTRAMUSCULAR | Status: DC | PRN
  Administered 2017-12-31: 5 mL via INTRAVENOUS

## 2017-12-31 MED ORDER — SODIUM CHLORIDE (PF) 0.9 % IJ SOLN
INTRAMUSCULAR | Status: AC
  Filled 2017-12-31: qty 50

## 2017-12-31 MED ORDER — LIDOCAINE HCL 1 % IJ SOLN
INTRAMUSCULAR | Status: DC | PRN
  Administered 2017-12-31: 10 mL

## 2017-12-31 MED ORDER — PROPOFOL 10 MG/ML IV BOLUS
INTRAVENOUS | Status: DC | PRN
  Administered 2017-12-31: 20 mg via INTRAVENOUS
  Administered 2017-12-31: 40 mg via INTRAVENOUS
  Administered 2017-12-31: 30 mg via INTRAVENOUS

## 2017-12-31 MED ORDER — ONDANSETRON HCL 4 MG/2ML IJ SOLN
INTRAMUSCULAR | Status: AC
  Filled 2017-12-31: qty 2

## 2017-12-31 MED ORDER — ONDANSETRON HCL 4 MG/2ML IJ SOLN
INTRAMUSCULAR | Status: DC | PRN
  Administered 2017-12-31: 4 mg via INTRAVENOUS

## 2017-12-31 MED ORDER — FENTANYL CITRATE (PF) 100 MCG/2ML IJ SOLN
INTRAMUSCULAR | Status: AC
  Filled 2017-12-31: qty 2

## 2017-12-31 MED ORDER — FENTANYL CITRATE (PF) 100 MCG/2ML IJ SOLN
INTRAMUSCULAR | Status: AC
  Administered 2017-12-31: 50 ug via INTRAVENOUS
  Filled 2017-12-31: qty 2

## 2017-12-31 MED ORDER — LIDOCAINE HCL (CARDIAC) PF 100 MG/5ML IV SOSY
PREFILLED_SYRINGE | INTRAVENOUS | Status: DC | PRN
  Administered 2017-12-31: 80 mg via INTRATRACHEAL

## 2017-12-31 MED ORDER — DEXAMETHASONE SODIUM PHOSPHATE 10 MG/ML IJ SOLN
INTRAMUSCULAR | Status: AC
  Filled 2017-12-31: qty 1

## 2017-12-31 MED ORDER — LIDOCAINE HCL (PF) 1 % IJ SOLN
INTRAMUSCULAR | Status: AC
  Filled 2017-12-31: qty 30

## 2017-12-31 MED ORDER — CEFAZOLIN SODIUM-DEXTROSE 2-4 GM/100ML-% IV SOLN
INTRAVENOUS | Status: AC
  Filled 2017-12-31: qty 100

## 2017-12-31 MED ORDER — FENTANYL CITRATE (PF) 100 MCG/2ML IJ SOLN
INTRAMUSCULAR | Status: DC | PRN
  Administered 2017-12-31 (×4): 25 ug via INTRAVENOUS

## 2017-12-31 MED ORDER — DEXAMETHASONE SODIUM PHOSPHATE 10 MG/ML IJ SOLN
INTRAMUSCULAR | Status: DC | PRN
  Administered 2017-12-31: 5 mg via INTRAVENOUS

## 2017-12-31 MED ORDER — LACTATED RINGERS IV SOLN
INTRAVENOUS | Status: DC
  Administered 2017-12-31: 09:00:00 via INTRAVENOUS

## 2017-12-31 MED ORDER — MIDAZOLAM HCL 2 MG/2ML IJ SOLN
INTRAMUSCULAR | Status: AC
  Filled 2017-12-31: qty 2

## 2017-12-31 MED ORDER — PROPOFOL 500 MG/50ML IV EMUL
INTRAVENOUS | Status: DC | PRN
  Administered 2017-12-31: 100 ug/kg/min via INTRAVENOUS

## 2017-12-31 SURGICAL SUPPLY — 30 items
BLADE SURG 15 STRL SS SAFETY (BLADE) ×3 IMPLANT
CHLORAPREP W/TINT 26ML (MISCELLANEOUS) ×3 IMPLANT
CLOSURE WOUND 1/2 X4 (GAUZE/BANDAGES/DRESSINGS) ×1
COVER LIGHT HANDLE STERIS (MISCELLANEOUS) ×6 IMPLANT
COVER WAND RF STERILE (DRAPES) IMPLANT
DECANTER SPIKE VIAL GLASS SM (MISCELLANEOUS) ×6 IMPLANT
DRAPE C-ARM XRAY 36X54 (DRAPES) ×3 IMPLANT
DRAPE LAPAROTOMY TRNSV 106X77 (MISCELLANEOUS) ×3 IMPLANT
DRSG TEGADERM 2-3/8X2-3/4 SM (GAUZE/BANDAGES/DRESSINGS) ×3 IMPLANT
DRSG TEGADERM 4X4.75 (GAUZE/BANDAGES/DRESSINGS) ×3 IMPLANT
DRSG TELFA 4X3 1S NADH ST (GAUZE/BANDAGES/DRESSINGS) ×3 IMPLANT
ELECT REM PT RETURN 9FT ADLT (ELECTROSURGICAL) ×3
ELECTRODE REM PT RTRN 9FT ADLT (ELECTROSURGICAL) ×1 IMPLANT
GLOVE BIO SURGEON STRL SZ7.5 (GLOVE) ×6 IMPLANT
GLOVE INDICATOR 8.0 STRL GRN (GLOVE) ×6 IMPLANT
GOWN STRL REUS W/ TWL LRG LVL3 (GOWN DISPOSABLE) ×2 IMPLANT
GOWN STRL REUS W/TWL LRG LVL3 (GOWN DISPOSABLE) ×4
KIT PORT POWER 8FR ISP CVUE (Port) ×3 IMPLANT
KIT TURNOVER KIT A (KITS) ×3 IMPLANT
LABEL OR SOLS (LABEL) ×3 IMPLANT
NS IRRIG 500ML POUR BTL (IV SOLUTION) ×3 IMPLANT
PACK PORT-A-CATH (MISCELLANEOUS) ×3 IMPLANT
STRIP CLOSURE SKIN 1/2X4 (GAUZE/BANDAGES/DRESSINGS) ×2 IMPLANT
SUT PROLENE 3 0 SH DA (SUTURE) ×3 IMPLANT
SUT VIC AB 3-0 SH 27 (SUTURE) ×2
SUT VIC AB 3-0 SH 27X BRD (SUTURE) ×1 IMPLANT
SUT VIC AB 4-0 FS2 27 (SUTURE) ×3 IMPLANT
SWABSTK COMLB BENZOIN TINCTURE (MISCELLANEOUS) ×3 IMPLANT
SYR 10ML LL (SYRINGE) ×3 IMPLANT
SYR 10ML SLIP (SYRINGE) ×3 IMPLANT

## 2017-12-31 NOTE — Transfer of Care (Signed)
Immediate Anesthesia Transfer of Care Note  [name]  Procedure(s) Performed: INSERTION PORT-A-CATH (Right )  Patient Location: PACU  Anesthesia Type:General  Level of Consciousness: awake and alert  Airway & Oxygen Therapy: Patient Spontanous Breathing  Post-op Assessment: Report given to RN and Post -op Vital signs reviewed and stable  Post vital signs: Reviewed and stable  Last Vitals:  Vitals Value Taken Time  BP 106/73 12/31/2017  9:50 AM  Temp 36.3 C 12/31/2017  9:50 AM  Pulse 73 12/31/2017  9:50 AM  Resp 15 12/31/2017  9:50 AM  SpO2 98 % 12/31/2017  9:50 AM  Vitals shown include unvalidated device data.  Last Pain:  Vitals:   12/31/17 0950  TempSrc: Temporal  PainSc: 0-No pain         Complications: No apparent anesthesia complications

## 2017-12-31 NOTE — Discharge Instructions (Signed)

## 2017-12-31 NOTE — Telephone Encounter (Signed)
Pharmacy notified to go ahead and substitute Pepcid 20 mg.     dhs

## 2017-12-31 NOTE — H&P (Signed)
No change in clinical condition or exam. For right power port placement.  

## 2017-12-31 NOTE — Anesthesia Post-op Follow-up Note (Signed)
Anesthesia QCDR form completed.        

## 2017-12-31 NOTE — OR Nursing (Signed)
Per Dr. Bary Castilla (via Minna Merritts in [place] room), patient may be discharged without his visit to postop.

## 2017-12-31 NOTE — Telephone Encounter (Signed)
Pre med RX (for CT Scan) was received for patient but Zantac is on backorder; can they substitute prescription Pepcid 20 mg. Please advise.      dhs

## 2017-12-31 NOTE — Op Note (Signed)
Preoperative diagnosis: Triple negative left breast cancer.  Postoperative diagnosis: Same.  Operative procedure: Right subclavian PowerPort placement.  Operating Surgeon: Hervey Ard, MD.  Anesthesia: Attended local, 10 cc 1% Xylocaine plain.  Estimated blood loss: Less than 5 cc.  Clinical note: This 47 year old woman was recently diagnosed with node positive triple negative carcinoma the left breast.  She is considered a candidate for neoadjuvant chemotherapy.  Central venous access was requested by her treating oncologist.  The patient had received Kefzol prior to the procedure.  Operative note: With the patient under adequate sedation the right neck chest and axilla was cleansed with ChloraPrep and draped.  Ultrasound was used to confirm patency of the right subclavian vein.  Local anesthetic was instilled.  The vein was cannulated under ultrasound guidance.  Guidewire was passed followed by the dilator and catheter.  Under fluoroscopy the catheter was placed at the junction of the SVC and right atrium.  This was then tunneled to a pocket on the right anterior chest.  The port easily irrigated and aspirated in this position.  The port was anchored to the deep tissue with 3-0 Prolene sutures x2.  Subcutaneous tissue was closed with a running 3-0 Vicryl suture.  The skin was closed with a running 4-0 Vicryl subarticular suture.  Benzoin and Steri-Strips followed by Telfa and Tegaderm dressings were applied.  The patient tolerated the procedure well.  She was taken to the recovery room in stable condition.  Her rec chest x-ray obtained in the recovery room showed findings as noted above without evidence of pneumothorax.

## 2017-12-31 NOTE — Anesthesia Preprocedure Evaluation (Signed)
Anesthesia Evaluation  Patient identified by MRN, date of birth, ID band Patient awake    Reviewed: Allergy & Precautions, H&P , NPO status , Patient's Chart, lab work & pertinent test results, reviewed documented beta blocker date and time   History of Anesthesia Complications (+) PONV, Family history of anesthesia reaction and history of anesthetic complications  Airway Mallampati: II  TM Distance: >3 FB Neck ROM: full    Dental  (+) Caps, Dental Advidsory Given   Pulmonary neg shortness of breath, asthma , neg COPD, neg recent URI, former smoker,           Cardiovascular Exercise Tolerance: Good hypertension, (-) angina(-) CAD, (-) Past MI, (-) Cardiac Stents and (-) CABG (-) dysrhythmias + Valvular Problems/Murmurs MVP      Neuro/Psych PSYCHIATRIC DISORDERS Anxiety Depression negative neurological ROS     GI/Hepatic Neg liver ROS, GERD  ,  Endo/Other  negative endocrine ROS  Renal/GU negative Renal ROS  negative genitourinary   Musculoskeletal   Abdominal   Peds  Hematology negative hematology ROS (+)   Anesthesia Other Findings Past Medical History: No date: Anemia     Comment:  h/o with pregnancy No date: Anxiety No date: Asthma     Comment:  allergy induced-no inhaler No date: Complication of anesthesia No date: Environmental allergies No date: Family history of adverse reaction to anesthesia     Comment:  son-breathing problems-coded 1st time when he was 2 and               had another surgery at 24 and had to be admitted for               breathing problems No date: Family history of breast cancer No date: GERD (gastroesophageal reflux disease) No date: Headache     Comment:  migraines No date: Hypertension No date: Mitral valve disease No date: Mitral valve prolapse No date: Painful menstrual periods No date: PONV (postoperative nausea and vomiting) No date: Vertigo    Reproductive/Obstetrics negative OB ROS                             Anesthesia Physical  Anesthesia Plan  ASA: II  Anesthesia Plan: General   Post-op Pain Management:    Induction: Intravenous  PONV Risk Score and Plan: 3 and TIVA, Ondansetron, Dexamethasone, Propofol infusion and Treatment may vary due to age or medical condition  Airway Management Planned: LMA  Additional Equipment:   Intra-op Plan:   Post-operative Plan:   Informed Consent: I have reviewed the patients History and Physical, chart, labs and discussed the procedure including the risks, benefits and alternatives for the proposed anesthesia with the patient or authorized representative who has indicated his/her understanding and acceptance.   Dental Advisory Given  Plan Discussed with: Anesthesiologist, CRNA and Surgeon  Anesthesia Plan Comments:         Anesthesia Quick Evaluation

## 2017-12-31 NOTE — Telephone Encounter (Signed)
Ok to substitute ranitidine for pepcid per Dr. Grayland Ormond.

## 2018-01-01 ENCOUNTER — Encounter
Admission: RE | Admit: 2018-01-01 | Discharge: 2018-01-01 | Disposition: A | Discharge status: Home / Self Care | Payer: No Typology Code available for payment source | Source: Ambulatory Visit | Attending: Oncology | Admitting: Oncology

## 2018-01-01 ENCOUNTER — Inpatient Hospital Stay: Admission: RE | Admit: 2018-01-01 | Payer: No Typology Code available for payment source | Source: Ambulatory Visit

## 2018-01-01 ENCOUNTER — Encounter: Payer: Self-pay | Admitting: General Surgery

## 2018-01-01 ENCOUNTER — Other Ambulatory Visit: Payer: Self-pay

## 2018-01-01 ENCOUNTER — Ambulatory Visit
Admission: RE | Admit: 2018-01-01 | Discharge: 2018-01-01 | Disposition: A | Discharge status: Home / Self Care | Payer: No Typology Code available for payment source | Source: Ambulatory Visit | Attending: Oncology | Admitting: Oncology

## 2018-01-01 ENCOUNTER — Ambulatory Visit: Admission: RE | Admit: 2018-01-01 | Payer: No Typology Code available for payment source | Source: Ambulatory Visit

## 2018-01-01 DIAGNOSIS — C50312 Malignant neoplasm of lower-inner quadrant of left female breast: Secondary | ICD-10-CM

## 2018-01-01 HISTORY — DX: Malignant (primary) neoplasm, unspecified: C80.1

## 2018-01-01 LAB — POCT I-STAT CREATININE: CREATININE: 0.6 mg/dL (ref 0.44–1.00)

## 2018-01-01 MED ORDER — IOPAMIDOL (ISOVUE-300) INJECTION 61%
100.0000 mL | Freq: Once | INTRAVENOUS | Status: AC | PRN
  Administered 2018-01-01: 100 mL via INTRAVENOUS

## 2018-01-01 MED ORDER — TECHNETIUM TC 99M-LABELED RED BLOOD CELLS IV KIT
20.0000 | PACK | Freq: Once | INTRAVENOUS | Status: AC | PRN
  Administered 2018-01-01: 21.8 via INTRAVENOUS

## 2018-01-01 NOTE — Anesthesia Postprocedure Evaluation (Signed)
Anesthesia Post Note  [name]  Procedure(s) Performed: INSERTION PORT-A-CATH (Right )  Patient location during evaluation: PACU Anesthesia Type: General Level of consciousness: awake and alert Pain management: pain level controlled Vital Signs Assessment: post-procedure vital signs reviewed and stable Respiratory status: spontaneous breathing, nonlabored ventilation, respiratory function stable and patient connected to nasal cannula oxygen Cardiovascular status: blood pressure returned to baseline and stable Postop Assessment: no apparent nausea or vomiting Anesthetic complications: no     Last Vitals:  Vitals:   12/31/17 1035 12/31/17 1058  BP: 123/73 130/70  Pulse: 67 72  Resp:    Temp: 37.1 C   SpO2: 99% 99%    Last Pain:  Vitals:   01/01/18 0817  TempSrc:   PainSc: 2                  Martha Clan

## 2018-01-02 ENCOUNTER — Other Ambulatory Visit: Payer: Self-pay

## 2018-01-02 NOTE — Progress Notes (Signed)
Opened in error

## 2018-01-03 ENCOUNTER — Other Ambulatory Visit: Payer: Self-pay

## 2018-01-03 ENCOUNTER — Inpatient Hospital Stay (HOSPITAL_BASED_OUTPATIENT_CLINIC_OR_DEPARTMENT_OTHER): Payer: No Typology Code available for payment source | Admitting: Oncology

## 2018-01-03 ENCOUNTER — Inpatient Hospital Stay: Payer: No Typology Code available for payment source

## 2018-01-03 ENCOUNTER — Inpatient Hospital Stay: Payer: No Typology Code available for payment source | Attending: Oncology

## 2018-01-03 VITALS — BP 129/86 | HR 76

## 2018-01-03 VITALS — BP 157/94 | HR 93 | Temp 97.0°F | Ht 66.0 in | Wt 157.1 lb

## 2018-01-03 DIAGNOSIS — G4700 Insomnia, unspecified: Secondary | ICD-10-CM | POA: Diagnosis not present

## 2018-01-03 DIAGNOSIS — D701 Agranulocytosis secondary to cancer chemotherapy: Secondary | ICD-10-CM | POA: Insufficient documentation

## 2018-01-03 DIAGNOSIS — D696 Thrombocytopenia, unspecified: Secondary | ICD-10-CM | POA: Insufficient documentation

## 2018-01-03 DIAGNOSIS — C50312 Malignant neoplasm of lower-inner quadrant of left female breast: Secondary | ICD-10-CM | POA: Diagnosis present

## 2018-01-03 DIAGNOSIS — C773 Secondary and unspecified malignant neoplasm of axilla and upper limb lymph nodes: Secondary | ICD-10-CM | POA: Insufficient documentation

## 2018-01-03 DIAGNOSIS — Z5111 Encounter for antineoplastic chemotherapy: Secondary | ICD-10-CM | POA: Insufficient documentation

## 2018-01-03 DIAGNOSIS — M79601 Pain in right arm: Secondary | ICD-10-CM | POA: Diagnosis not present

## 2018-01-03 DIAGNOSIS — Z171 Estrogen receptor negative status [ER-]: Secondary | ICD-10-CM | POA: Diagnosis not present

## 2018-01-03 DIAGNOSIS — M25511 Pain in right shoulder: Secondary | ICD-10-CM | POA: Insufficient documentation

## 2018-01-03 DIAGNOSIS — F419 Anxiety disorder, unspecified: Secondary | ICD-10-CM | POA: Diagnosis not present

## 2018-01-03 DIAGNOSIS — Z87891 Personal history of nicotine dependence: Secondary | ICD-10-CM | POA: Insufficient documentation

## 2018-01-03 DIAGNOSIS — Z5189 Encounter for other specified aftercare: Secondary | ICD-10-CM | POA: Insufficient documentation

## 2018-01-03 LAB — COMPREHENSIVE METABOLIC PANEL
ALBUMIN: 4.6 g/dL (ref 3.5–5.0)
ALT: 11 U/L (ref 0–44)
AST: 17 U/L (ref 15–41)
Alkaline Phosphatase: 37 U/L — ABNORMAL LOW (ref 38–126)
Anion gap: 9 (ref 5–15)
BUN: 14 mg/dL (ref 6–20)
CO2: 29 mmol/L (ref 22–32)
Calcium: 9.7 mg/dL (ref 8.9–10.3)
Chloride: 100 mmol/L (ref 98–111)
Creatinine, Ser: 0.64 mg/dL (ref 0.44–1.00)
GFR calc Af Amer: >60 mL/min (ref >60)
Glucose, Bld: 116 mg/dL — ABNORMAL HIGH (ref 70–99)
Potassium: 3.4 mmol/L — ABNORMAL LOW (ref 3.5–5.1)
Sodium: 138 mmol/L (ref 135–145)
Total Bilirubin: 0.9 mg/dL (ref 0.3–1.2)
Total Protein: 7.3 g/dL (ref 6.5–8.1)

## 2018-01-03 LAB — CBC WITH DIFFERENTIAL/PLATELET
Abs Immature Granulocytes: 0.02 10*3/uL (ref 0.00–0.07)
Basophils Absolute: 0 10*3/uL (ref 0.0–0.1)
Basophils Relative: 1 %
Eosinophils Absolute: 0 10*3/uL (ref 0.0–0.5)
Eosinophils Relative: 1 %
HCT: 36.8 % (ref 36.0–46.0)
HEMOGLOBIN: 12.5 g/dL (ref 12.0–15.0)
Immature Granulocytes: 0 %
Lymphocytes Relative: 21 %
Lymphs Abs: 1.4 10*3/uL (ref 0.7–4.0)
MCH: 30.3 pg (ref 26.0–34.0)
MCHC: 34 g/dL (ref 30.0–36.0)
MCV: 89.1 fL (ref 80.0–100.0)
MONO ABS: 0.5 10*3/uL (ref 0.1–1.0)
Monocytes Relative: 7 %
Neutro Abs: 4.5 10*3/uL (ref 1.7–7.7)
Neutrophils Relative %: 70 %
Platelets: 252 10*3/uL (ref 150–400)
RBC: 4.13 MIL/uL (ref 3.87–5.11)
RDW: 11.7 % (ref 11.5–15.5)
WBC: 6.4 10*3/uL (ref 4.0–10.5)
nRBC: 0 % (ref 0.0–0.2)

## 2018-01-03 MED ORDER — SODIUM CHLORIDE 0.9 % IV SOLN
Freq: Once | INTRAVENOUS | Status: AC
  Administered 2018-01-03: 10:00:00 via INTRAVENOUS
  Filled 2018-01-03: qty 250

## 2018-01-03 MED ORDER — HEPARIN SOD (PORK) LOCK FLUSH 100 UNIT/ML IV SOLN
500.0000 [IU] | Freq: Once | INTRAVENOUS | Status: AC | PRN
  Administered 2018-01-03: 500 [IU]
  Filled 2018-01-03: qty 5

## 2018-01-03 MED ORDER — SODIUM CHLORIDE 0.9 % IV SOLN
1000.0000 mg | Freq: Once | INTRAVENOUS | Status: AC
  Administered 2018-01-03: 1000 mg via INTRAVENOUS
  Filled 2018-01-03: qty 50

## 2018-01-03 MED ORDER — SODIUM CHLORIDE 0.9 % IV SOLN
Freq: Once | INTRAVENOUS | Status: AC
  Administered 2018-01-03: 11:00:00 via INTRAVENOUS
  Filled 2018-01-03: qty 5

## 2018-01-03 MED ORDER — PALONOSETRON HCL INJECTION 0.25 MG/5ML
0.2500 mg | Freq: Once | INTRAVENOUS | Status: AC
  Administered 2018-01-03: 0.25 mg via INTRAVENOUS
  Filled 2018-01-03: qty 5

## 2018-01-03 MED ORDER — DOXORUBICIN HCL CHEMO IV INJECTION 2 MG/ML
60.0000 mg/m2 | Freq: Once | INTRAVENOUS | Status: AC
  Administered 2018-01-03: 104 mg via INTRAVENOUS
  Filled 2018-01-03: qty 50

## 2018-01-03 NOTE — Progress Notes (Signed)
Patient is here today for left breast cancer. Patient will be getting her first treatment today. Patient is very nervous and anxious today.

## 2018-01-04 ENCOUNTER — Inpatient Hospital Stay: Payer: No Typology Code available for payment source

## 2018-01-04 VITALS — BP 149/90 | HR 77 | Temp 98.9°F

## 2018-01-04 DIAGNOSIS — Z5111 Encounter for antineoplastic chemotherapy: Secondary | ICD-10-CM | POA: Diagnosis not present

## 2018-01-04 DIAGNOSIS — C50312 Malignant neoplasm of lower-inner quadrant of left female breast: Secondary | ICD-10-CM

## 2018-01-04 MED ORDER — PEGFILGRASTIM-CBQV 6 MG/0.6ML ~~LOC~~ SOSY
6.0000 mg | PREFILLED_SYRINGE | Freq: Once | SUBCUTANEOUS | Status: AC
  Administered 2018-01-04: 6 mg via SUBCUTANEOUS
  Filled 2018-01-04: qty 0.6

## 2018-01-06 NOTE — Progress Notes (Signed)
[place]  [phone] [phone]) [phone]  ID: Shelby [name] OB: 19-Jul-1970  MR#: [id number]  DXI#:[id number]  Patient Care Team: Jerrol Banana., MD as PCP - General (Family Medicine) [name], MD as Consulting Physician (Cardiology)  CHIEF COMPLAINT: Clinical stage IIIB triple negative invasive carcinoma of the lower inner quadrant of the left breast.  INTERVAL HISTORY: Patient returns to clinic today for further evaluation and to assess her toleration of cycle 1 of Adriamycin and Cytoxan.  She noticed some increased nausea and fatigue for several days after treatment, but this is now resolved.  She also has noted right ear pain, but denies any fevers.  She was able to work full-time this week. She continues to be highly anxious, but otherwise feels well.  She has no neurologic complaints.  She denies any recent fevers or illnesses.  She has a good appetite and denies weight loss.  She has no chest pain or shortness of breath.  She denies any nausea, vomiting, constipation, or diarrhea.  She has no urinary complaints.  Patient offers no further specific complaints today.  REVIEW OF SYSTEMS:   Review of Systems  Constitutional: Negative.  Negative for fever, malaise/fatigue and weight loss.  Respiratory: Negative.  Negative for cough, hemoptysis and shortness of breath.   Cardiovascular: Negative.  Negative for chest pain and leg swelling.  Gastrointestinal: Negative.  Negative for abdominal pain and melena.  Genitourinary: Negative.  Negative for dysuria.  Musculoskeletal: Negative.  Negative for back pain.  Skin: Negative.  Negative for rash.  Neurological: Negative.  Negative for dizziness, focal weakness and weakness.  Psychiatric/Behavioral: The patient is nervous/anxious.     As per HPI. Otherwise, a complete review of systems is negative.  PAST MEDICAL HISTORY: Past Medical History:  Diagnosis Date  . Anemia    h/o with  pregnancy  . Anxiety   . Asthma    allergy induced-no inhaler  . Cancer ([place])   . Complication of anesthesia   . Environmental allergies   . Family history of adverse reaction to anesthesia    son-breathing problems-coded 1st time when he was 2 and had another surgery at 64 and had to be admitted for breathing problems  . Family history of breast cancer   . GERD (gastroesophageal reflux disease)   . Headache    migraines  . Hypertension   . Mitral valve disease   . Mitral valve prolapse   . Painful menstrual periods   . PONV (postoperative nausea and vomiting)   . Vertigo     PAST SURGICAL HISTORY: Past Surgical History:  Procedure Laterality Date  . ABDOMINAL HYSTERECTOMY  2010   supracervical   . BUNIONECTOMY Bilateral   . MANDIBLE RECONSTRUCTION     age 1-underbite  . PORTACATH PLACEMENT Right 12/31/2017   Procedure: INSERTION PORT-A-CATH;  Surgeon: Robert Bellow, MD;  Location: [facility] ORS;  Service: General;  Laterality: Right;  . SHOULDER ARTHROSCOPY WITH OPEN ROTATOR CUFF REPAIR Right 04/26/2017   Procedure: SHOULDER ARTHROSCOPY WITH OPEN ROTATOR CUFF REPAIR,SUBACROMINAL DECOMPRESSION;  Surgeon: Thornton Park, MD;  Location: [facility] ORS;  Service: Orthopedics;  Laterality: Right;  . TONSILLECTOMY      FAMILY HISTORY: Family History  Problem Relation Age of Onset  . Breast cancer Mother 48  . Diabetes Father   . Cancer Maternal Uncle        colon  . Breast cancer Maternal Grandmother 88    ADVANCED DIRECTIVES (Y/N):  N  HEALTH  MAINTENANCE: Social History   Tobacco Use  . Smoking status: Former Smoker    Packs/day: 0.50    Years: 10.00    Pack years: 5.00    Types: Cigarettes    Last attempt to quit: 04/20/2007    Years since quitting: 10.7  . Smokeless tobacco: Never Used  . Tobacco comment: social smoker back then  Substance Use Topics  . Alcohol use: Yes    Comment: occas  . Drug use: No     Colonoscopy:  PAP:  Bone density:  Lipid  panel:  Allergies  Allergen Reactions  . Hydrocodone-Acetaminophen Hives    swollen face  . Iodine Hives  . Peanut Butter Flavor Other (See Comments)    Made mouth tingle  . Shellfish Allergy Hives    Current Outpatient Medications  Medication Sig Dispense Refill  . ALPRAZolam (XANAX) 0.25 MG tablet Take 1 tablet (0.25 mg total) by mouth 2 (two) times daily as needed for anxiety. 60 tablet 0  . Ascorbic Acid (VITAMIN C PO) Take 1 tablet by mouth daily.     . Cholecalciferol (VITAMIN D) 50 MCG (2000 UT) CAPS Take 2,000 Units by mouth daily.    . diazepam (VALIUM) 5 MG tablet Take 1 tablet (5 mg total) by mouth every 6 (six) hours as needed for anxiety. 30 tablet 0  . EPINEPHrine 0.3 mg/0.3 mL IJ SOAJ injection Inject 0.3 mLs (0.3 mg total) into the muscle once. 1 Device 12  . famotidine (PEPCID) 20 MG tablet   0  . fluticasone (FLONASE) 50 MCG/ACT nasal spray Place 1 spray into both nostrils daily as needed for allergies or rhinitis.    Marland Kitchen ibuprofen (ADVIL,MOTRIN) 200 MG tablet Take 400 mg by mouth daily as needed for moderate pain.    Marland Kitchen L-Methylfolate 15 MG TABS TAKE 1/2 TABLET (7.5 MG) BY MOUTH DAILY (Patient taking differently: Take 7.5 mg by mouth daily. ) 45 tablet 3  . lidocaine-prilocaine (EMLA) cream Apply to affected area once 30 g 3  . losartan (COZAAR) 50 MG tablet TAKE 1 TABLET BY MOUTH DAILY 90 tablet 3  . montelukast (SINGULAIR) 10 MG tablet Take 1 tablet (10 mg total) by mouth at bedtime. 30 tablet 12  . ondansetron (ZOFRAN) 8 MG tablet Take 1 tablet (8 mg total) by mouth 2 (two) times daily as needed. 30 tablet 2  . Probiotic CAPS Take 1 capsule by mouth at bedtime.    . prochlorperazine (COMPAZINE) 10 MG tablet Take 1 tablet (10 mg total) by mouth every 6 (six) hours as needed (Nausea or vomiting). 60 tablet 2  . tretinoin (RETIN-A) 0.1 % cream Apply 1 application topically at bedtime.     No current facility-administered medications for this visit.      OBJECTIVE: Vitals:   01/10/18 1034  BP: (!) 145/95  Pulse: 84  Temp: 98.7 F (37.1 C)     Body mass index is 22.63 kg/m.    ECOG FS:0 - Asymptomatic  General: Well-developed, well-nourished, no acute distress. Eyes: Pink conjunctiva, anicteric sclera. HEENT: Normocephalic, moist mucous membranes. Breast: Easily palpable left breast mass, exam deferred today. Lungs: Clear to auscultation bilaterally. Heart: Regular rate and rhythm. No rubs, murmurs, or gallops. Abdomen: Soft, nontender, nondistended. No organomegaly noted, normoactive bowel sounds. Musculoskeletal: No edema, cyanosis, or clubbing. Neuro: Alert, answering all questions appropriately. Cranial nerves grossly intact. Skin: No rashes or petechiae noted. Psych: Normal affect.  LAB RESULTS:  Lab Results  Component Value Date   NA 134 (  L) 01/10/2018   K 3.9 01/10/2018   CL 98 01/10/2018   CO2 28 01/10/2018   GLUCOSE 104 (H) 01/10/2018   BUN 12 01/10/2018   CREATININE 0.58 01/10/2018   CALCIUM 9.7 01/10/2018   PROT 7.1 01/10/2018   ALBUMIN 4.5 01/10/2018   AST 17 01/10/2018   ALT 12 01/10/2018   ALKPHOS 60 01/10/2018   BILITOT 0.9 01/10/2018   GFRNONAA >60 01/10/2018   GFRAA >60 01/10/2018    Lab Results  Component Value Date   WBC 1.1 (LL) 01/10/2018   NEUTROABS 0.3 (L) 01/10/2018   HGB 11.9 (L) 01/10/2018   HCT 35.6 (L) 01/10/2018   MCV 89.0 01/10/2018   PLT 139 (L) 01/10/2018     STUDIES: Ct Chest W Contrast  Result Date: 01/02/2018 CLINICAL DATA:  Left-sided breast cancer. Asymptomatic. Prior hysterectomy. EXAM: CT CHEST, ABDOMEN, AND PELVIS WITH CONTRAST TECHNIQUE: Multidetector CT imaging of the chest, abdomen and pelvis was performed following the standard protocol during bolus administration of intravenous contrast. CONTRAST:  138m ISOVUE-300 IOPAMIDOL (ISOVUE-300) INJECTION 61% COMPARISON:  11/25/2012 noncontrast abdominopelvic CT. Chest radiograph 12/31/2017. FINDINGS: CT CHEST  FINDINGS Cardiovascular: Right Port-A-Cath tip high right atrium. Normal heart size, without pericardial effusion. No central pulmonary embolism, on this non-dedicated study. Mediastinum/Nodes: No supraclavicular adenopathy. No subpectoral or axillary adenopathy. No mediastinal or hilar adenopathy. No internal mammary adenopathy. Lungs/Pleura: No pleural fluid. Bibasilar scarring or subsegmental atelectasis. Musculoskeletal: Nodularity within the inferomedial aspect of the left breast/chest wall, including at 1.4 cm on image 41/2, likely representing the primary. No acute osseous abnormality. CT ABDOMEN PELVIS FINDINGS Hepatobiliary: Low-density high right hepatic lobe lesion image 48/2 is felt to be subtly present back in 2014, favoring a benign etiology. Normal gallbladder, without biliary ductal dilatation. Pancreas: Normal, without mass or ductal dilatation. Spleen: Normal in size, without focal abnormality. Adrenals/Urinary Tract: Normal adrenal glands. Normal kidneys, without hydronephrosis. Normal urinary bladder. Stomach/Bowel: Normal stomach, without wall thickening. Normal colon and terminal ileum. Normal small bowel. Vascular/Lymphatic: Normal caliber of the aorta and branch vessels. No abdominopelvic adenopathy. Reproductive: Hysterectomy.  No adnexal mass. Other: No significant free fluid. Mild pelvic floor laxity. No evidence of omental or peritoneal disease. Suspect at least 1 tiny fat containing ventral abdominal wall hernia, including on image 76/2. Musculoskeletal: Tiny sclerotic lesions within the pelvis are most likely bone islands. IMPRESSION: 1. Inferior medial left breast nodularity likely represents the primary lesion. No evidence of metastatic disease in the chest, abdomen, or pelvis. 2. Probable bone islands throughout the pelvis. Recommend attention on follow-up. Electronically Signed   By: KAbigail MiyamotoM.D.   On: 01/02/2018 10:40   Nm Cardiac Muga Rest  Result Date:  01/01/2018 CLINICAL DATA:  Breast cancer. Evaluate cardiac function in relation to chemotherapy. EXAM: NUCLEAR MEDICINE CARDIAC BLOOD POOL IMAGING (MUGA) TECHNIQUE: Cardiac multi-gated acquisition was performed at rest following intravenous injection of Tc-959mabeled red blood cells. RADIOPHARMACEUTICALS:  21.8 mCi [id number] in-vitro labeled red blood cells IV COMPARISON:  None. FINDINGS: No  focal wall motion abnormality of the left ventricle. Calculated left ventricular ejection fraction equals 71% IMPRESSION: Left ventricular ejection fraction equals71 %. Electronically Signed   By: SteSuzy BouchardD.   On: 01/01/2018 16:14   Ct Abdomen Pelvis W Contrast  Result Date: 01/02/2018 CLINICAL DATA:  Left-sided breast cancer. Asymptomatic. Prior hysterectomy. EXAM: CT CHEST, ABDOMEN, AND PELVIS WITH CONTRAST TECHNIQUE: Multidetector CT imaging of the chest, abdomen and pelvis was performed following the standard protocol during bolus administration  of intravenous contrast. CONTRAST:  164m ISOVUE-300 IOPAMIDOL (ISOVUE-300) INJECTION 61% COMPARISON:  11/25/2012 noncontrast abdominopelvic CT. Chest radiograph 12/31/2017. FINDINGS: CT CHEST FINDINGS Cardiovascular: Right Port-A-Cath tip high right atrium. Normal heart size, without pericardial effusion. No central pulmonary embolism, on this non-dedicated study. Mediastinum/Nodes: No supraclavicular adenopathy. No subpectoral or axillary adenopathy. No mediastinal or hilar adenopathy. No internal mammary adenopathy. Lungs/Pleura: No pleural fluid. Bibasilar scarring or subsegmental atelectasis. Musculoskeletal: Nodularity within the inferomedial aspect of the left breast/chest wall, including at 1.4 cm on image 41/2, likely representing the primary. No acute osseous abnormality. CT ABDOMEN PELVIS FINDINGS Hepatobiliary: Low-density high right hepatic lobe lesion image 48/2 is felt to be subtly present back in 2014, favoring a benign etiology. Normal  gallbladder, without biliary ductal dilatation. Pancreas: Normal, without mass or ductal dilatation. Spleen: Normal in size, without focal abnormality. Adrenals/Urinary Tract: Normal adrenal glands. Normal kidneys, without hydronephrosis. Normal urinary bladder. Stomach/Bowel: Normal stomach, without wall thickening. Normal colon and terminal ileum. Normal small bowel. Vascular/Lymphatic: Normal caliber of the aorta and branch vessels. No abdominopelvic adenopathy. Reproductive: Hysterectomy.  No adnexal mass. Other: No significant free fluid. Mild pelvic floor laxity. No evidence of omental or peritoneal disease. Suspect at least 1 tiny fat containing ventral abdominal wall hernia, including on image 76/2. Musculoskeletal: Tiny sclerotic lesions within the pelvis are most likely bone islands. IMPRESSION: 1. Inferior medial left breast nodularity likely represents the primary lesion. No evidence of metastatic disease in the chest, abdomen, or pelvis. 2. Probable bone islands throughout the pelvis. Recommend attention on follow-up. Electronically Signed   By: KAbigail MiyamotoM.D.   On: 01/02/2018 10:40   Dg Chest Port 1 View  Result Date: 12/31/2017 CLINICAL DATA:  Status post Port-A-Cath placement. EXAM: PORTABLE CHEST 1 VIEW COMPARISON:  03/10/2015 FINDINGS: A right subclavian Port-A-Cath has been placed and terminates at the superior cavoatrial junction. The cardiomediastinal silhouette is within normal limits. The lungs are well inflated and clear. No pleural effusion or pneumothorax is identified. No acute osseous abnormality is seen. IMPRESSION: Port-A-Cath as above. No evidence of pneumothorax or acute airspace disease. Electronically Signed   By: ALogan BoresM.D.   On: 12/31/2017 10:26   Dg C-arm 1-60 Min-no Report  Result Date: 12/31/2017 Fluoroscopy was utilized by the requesting physician.  No radiographic interpretation.   [place]ast Ltd Uni Left Inc Axilla  Result Date: 12/17/2017 CLINICAL  DATA:  Painful palpable abnormality in the LEFT breast first noticed 1 week ago. EXAM: DIGITAL DIAGNOSTIC BILATERAL MAMMOGRAM WITH CAD AND TOMO ULTRASOUND LEFT BREAST COMPARISON:  06/29/2017 and earlier ACR Breast Density Category d: The breast tissue is extremely dense, which lowers the sensitivity of mammography. FINDINGS: Right breast: Mammogram: No suspicious mass, distortion, or microcalcifications are identified to suggest presence of malignancy. Mammographic images were processed with CAD. Left breast: Mammogram: On spot tangential view of the LEFT breast there is a partially imaged irregular mass associated with fine linear calcifications. Mammographic images were processed with CAD. Physical Exam: I palpate a discrete firm mass in the 8 o'clock location of the LEFT breast 6 centimeters from nipple. Ultrasound: Targeted ultrasound is performed, showing the palpable abnormality is composed of 3 small masses, measured together to be 2.3 x 2.0 x 0.8 centimeters. These masses are irregular in shape, with irregular margins, and demonstrate internal blood flow on Doppler evaluation. Mixed posterior acoustic features. Evaluation of the LEFT axilla demonstrates a single lymph node with thickened cortex. Lymph node is adjacent to blood vessels but may  be amenable to ultrasound-guided biopsy. IMPRESSION: 1. Suspicious palpable mass in the 8 o'clock location of the LEFT breast for which ultrasound-guided core biopsy is indicated. 2. Suspicious LEFT axillary lymph node for which tissue sampling is recommended. At the time of biopsy, I would recommend assessment of the neighboring blood vessels to determine if there is an appropriate sonographic window for biopsy or ultrasound-guided placement of a HydroMARK clip. RECOMMENDATION: 1. Ultrasound-guided core biopsy of LEFT breast mass 8 o'clock location. 2. Ultrasound-guided core biopsy or HydroMARK clip placement of enlarged LEFT axillary lymph node if possible, see above.  I have discussed the findings and recommendations with the patient. Results were also provided in writing at the conclusion of the visit. If applicable, a reminder letter will be sent to the patient regarding the next appointment. BI-RADS CATEGORY  4: Suspicious. Electronically Signed   By: Nolon Nations M.D.   On: 12/17/2017 16:13   Mm Diag Breast Tomo Bilateral  Result Date: 12/17/2017 CLINICAL DATA:  Painful palpable abnormality in the LEFT breast first noticed 1 week ago. EXAM: DIGITAL DIAGNOSTIC BILATERAL MAMMOGRAM WITH CAD AND TOMO ULTRASOUND LEFT BREAST COMPARISON:  06/29/2017 and earlier ACR Breast Density Category d: The breast tissue is extremely dense, which lowers the sensitivity of mammography. FINDINGS: Right breast: Mammogram: No suspicious mass, distortion, or microcalcifications are identified to suggest presence of malignancy. Mammographic images were processed with CAD. Left breast: Mammogram: On spot tangential view of the LEFT breast there is a partially imaged irregular mass associated with fine linear calcifications. Mammographic images were processed with CAD. Physical Exam: I palpate a discrete firm mass in the 8 o'clock location of the LEFT breast 6 centimeters from nipple. Ultrasound: Targeted ultrasound is performed, showing the palpable abnormality is composed of 3 small masses, measured together to be 2.3 x 2.0 x 0.8 centimeters. These masses are irregular in shape, with irregular margins, and demonstrate internal blood flow on Doppler evaluation. Mixed posterior acoustic features. Evaluation of the LEFT axilla demonstrates a single lymph node with thickened cortex. Lymph node is adjacent to blood vessels but may be amenable to ultrasound-guided biopsy. IMPRESSION: 1. Suspicious palpable mass in the 8 o'clock location of the LEFT breast for which ultrasound-guided core biopsy is indicated. 2. Suspicious LEFT axillary lymph node for which tissue sampling is recommended. At the  time of biopsy, I would recommend assessment of the neighboring blood vessels to determine if there is an appropriate sonographic window for biopsy or ultrasound-guided placement of a HydroMARK clip. RECOMMENDATION: 1. Ultrasound-guided core biopsy of LEFT breast mass 8 o'clock location. 2. Ultrasound-guided core biopsy or HydroMARK clip placement of enlarged LEFT axillary lymph node if possible, see above. I have discussed the findings and recommendations with the patient. Results were also provided in writing at the conclusion of the visit. If applicable, a reminder letter will be sent to the patient regarding the next appointment. BI-RADS CATEGORY  4: Suspicious. Electronically Signed   By: Nolon Nations M.D.   On: 12/17/2017 16:13   [place] Axillary Node Core Biopsy Left  Addendum Date: 12/21/2017   ADDENDUM REPORT: 12/21/2017 12:38 ADDENDUM: Pathology revealed GRADE III INVASIVE DUCTAL CARCINOMA, DUCTAL CARCINOMA IN SITU, LYMPHOVASCULAR INVASION IS IDENTIFIED of the Left breast, 8 o'clock. METASTATIC CARCINOMA IN the Left axillary lymph node. This was found to be concordant by Dr. Everlean Alstrom. Pathology results will discussed with the patient by telephone by Lorelle Gibbs, CNM at [facility] in [place], [place]. Melody Gayla [facility]us will arrange a surgical referral  for the patient. Recommendation for a bilateral breast MRI due to family history and extremely dense breasts. Pathology results reported by Terie Purser, RN on 12/21/2017. Electronically Signed   By: Everlean Alstrom M.D.   On: 12/21/2017 12:38   Result Date: 12/21/2017 CLINICAL DATA:  47 year old female with suspicious cluster of palpable masses in the left breast at the 7 8 o'clock position as well as a suspicious lymph node in the left axilla with cortical thickening. EXAM: ULTRASOUND GUIDED LEFT BREAST CORE NEEDLE BIOPSY ULTRASOUND-GUIDED LEFT AXILLA CORE NEEDLE BIOPSY COMPARISON:  Previous exam(s). FINDINGS: I met with the  patient and we discussed the procedure of ultrasound-guided biopsy, including benefits and alternatives. We discussed the high likelihood of a successful procedure. We discussed the risks of the procedure, including infection, bleeding, tissue injury, clip migration, and inadequate sampling. Informed written consent was given. The usual time-out protocol was performed immediately prior to the procedure. SITE 1: LEFT BREAST 8 O'CLOCK: MASS: Lesion quadrant: LOWER INNER Using sterile technique and 1% Lidocaine as local anesthetic, under direct ultrasound visualization, a 12 gauge spring-loaded device was used to perform biopsy of the largest of the cluster of masses at the 8 o'clock position using a medial to lateral approach. At the conclusion of the procedure a ribbon shaped tissue marker clip was deployed into the biopsy cavity. SITE 2: LEFT AXILLA: LYMPH NODE: Lesion quadrant: UPPER-OUTER Using sterile technique and 1% Lidocaine as local anesthetic, under direct ultrasound visualization, a 14 gauge spring-loaded device was used to perform biopsy of the lymph node in the left axilla using a inferolateral to superior medial approach. At the conclusion of the procedure a spiral shaped HydroMARK tissue marker clip was deployed into the biopsy cavity. Follow up 2 view mammogram was performed and dictated separately. IMPRESSION: 1. Ultrasound-guided biopsy of the palpable mass in the left breast at the 8 o'clock position. 2. Ultrasound-guided biopsy of a lymph node with a thickened cortex in the left axilla. Electronically Signed: By: Everlean Alstrom M.D. On: 12/20/2017 14:38   Mm Clip Placement Left  Result Date: 12/20/2017 CLINICAL DATA:  Post ultrasound-guided biopsy of a suspicious mass at the 8 o'clock position as well as biopsy of a lymph node in the left axilla demonstrating a thickened cortex. EXAM: DIAGNOSTIC LEFT MAMMOGRAM POST ULTRASOUND BIOPSY COMPARISON:  Previous exam(s). FINDINGS: Mammographic  images were obtained following ultrasound guided biopsy of a suspicious mass in the left breast at the 8 o'clock position and ultrasound-guided biopsy of a suspicious lymph node in the left axilla. A ribbon shaped biopsy marking clip is present and felt to be located at the posterior margin of the biopsied mass in the left breast at the 8 o'clock position. Suspicious calcifications are noted to be present 1.5 cm lateral and superior to the biopsied mass in the left breast. The spiral shaped HydroMARK clip is present and located at the anterior margin of the biopsied lymph node in the left axilla. IMPRESSION: 1. Ribbon shaped biopsy marking clip along posterior margin of biopsied mass in the left breast. Suspicious calcifications are noted to be present 1.5 cm lateral and superior to the biopsied mass in the left breast. 2. Spiral shaped HydroMARK clip located at anterior margin of biopsied lymph node in the left axilla. Final Assessment: Post Procedure Mammograms for Marker Placement Electronically Signed   By: Everlean Alstrom M.D.   On: 12/20/2017 14:52   [place] Lt Breast Bx W Loc Dev 1st Lesion Img Bx Spec US Guide  Addendum Date: 12/21/2017   ADDENDUM REPORT: 12/21/2017 12:38 ADDENDUM: Pathology revealed GRADE III INVASIVE DUCTAL CARCINOMA, DUCTAL CARCINOMA IN SITU, LYMPHOVASCULAR INVASION IS IDENTIFIED of the Left breast, 8 o'clock. METASTATIC CARCINOMA IN the Left axillary lymph node. This was found to be concordant by Dr. Everlean Alstrom. Pathology results will discussed with the patient by telephone by Lorelle Gibbs, CNM at [facility] in [place], [place]. Melody Gayla [facility] will arrange a surgical referral for the patient. Recommendation for a bilateral breast MRI due to family history and extremely dense breasts. Pathology results reported by Terie Purser, RN on 12/21/2017. Electronically Signed   By: Everlean Alstrom M.D.   On: 12/21/2017 12:38   Result Date: 12/21/2017 CLINICAL DATA:   47 year old female with suspicious cluster of palpable masses in the left breast at the 7 8 o'clock position as well as a suspicious lymph node in the left axilla with cortical thickening. EXAM: ULTRASOUND GUIDED LEFT BREAST CORE NEEDLE BIOPSY ULTRASOUND-GUIDED LEFT AXILLA CORE NEEDLE BIOPSY COMPARISON:  Previous exam(s). FINDINGS: I met with the patient and we discussed the procedure of ultrasound-guided biopsy, including benefits and alternatives. We discussed the high likelihood of a successful procedure. We discussed the risks of the procedure, including infection, bleeding, tissue injury, clip migration, and inadequate sampling. Informed written consent was given. The usual time-out protocol was performed immediately prior to the procedure. SITE 1: LEFT BREAST 8 O'CLOCK: MASS: Lesion quadrant: LOWER INNER Using sterile technique and 1% Lidocaine as local anesthetic, under direct ultrasound visualization, a 12 gauge spring-loaded device was used to perform biopsy of the largest of the cluster of masses at the 8 o'clock position using a medial to lateral approach. At the conclusion of the procedure a ribbon shaped tissue marker clip was deployed into the biopsy cavity. SITE 2: LEFT AXILLA: LYMPH NODE: Lesion quadrant: UPPER-OUTER Using sterile technique and 1% Lidocaine as local anesthetic, under direct ultrasound visualization, a 14 gauge spring-loaded device was used to perform biopsy of the lymph node in the left axilla using a inferolateral to superior medial approach. At the conclusion of the procedure a spiral shaped HydroMARK tissue marker clip was deployed into the biopsy cavity. Follow up 2 view mammogram was performed and dictated separately. IMPRESSION: 1. Ultrasound-guided biopsy of the palpable mass in the left breast at the 8 o'clock position. 2. Ultrasound-guided biopsy of a lymph node with a thickened cortex in the left axilla. Electronically Signed: By: Everlean Alstrom M.D. On: 12/20/2017 14:38     ASSESSMENT: Clinical stage IIIB triple negative invasive carcinoma of the lower inner quadrant of the left breast.  PLAN:    1. Clinical stage IIIB triple negative invasive carcinoma of the lower inner quadrant of the left breast: CT scan results from January 01, 2018 did not reveal any metastatic disease.  Given patient's stage of disease, she will benefit from neoadjuvant chemotherapy using Adriamycin, Cytoxan with Neulasta support followed by carboplatinum and Taxol.  Patient has indicated she may prefer to undergo mastectomy, therefore adjuvant XRT may not be needed.  An aromatase inhibitor would not offer any benefit given the triple negative status of her disease.  MUGA scan on January 01, 2018 revealed an EF of 71%.  Patient tolerated cycle 1 of Adriamycin and Cytoxan last week relatively well.  Return to clinic in 1 week for further evaluation and consideration of cycle 2.  2.  Anxiety: Chronic.  Continue Xanax as needed. 3.  Neutropenia: Secondary to chemotherapy.  Udenyca as above. 4.  Thrombocytopenia:  Mild, monitor.  Patient expressed understanding and was in agreement with this plan. She also understands that She can call clinic at any time with any questions, concerns, or complaints.   Cancer Staging Primary cancer of lower-inner quadrant of left female breast [facility]) Staging form: Breast, AJCC 8th Edition - Clinical stage from 12/24/2017: Stage IIIB (cT2, cN1, cM0, G3, ER-, PR-, HER2-) - Signed by Lloyd Huger, MD on 12/24/2017 - Pathologic: No stage assigned - Unsigned   Lloyd Huger, MD   01/11/2018 3:26 PM

## 2018-01-07 ENCOUNTER — Encounter: Payer: Self-pay | Admitting: Oncology

## 2018-01-07 ENCOUNTER — Telehealth: Payer: Self-pay | Admitting: *Deleted

## 2018-01-07 ENCOUNTER — Inpatient Hospital Stay (HOSPITAL_BASED_OUTPATIENT_CLINIC_OR_DEPARTMENT_OTHER): Payer: No Typology Code available for payment source | Admitting: Oncology

## 2018-01-07 VITALS — BP 151/95 | HR 99 | Temp 98.9°F | Resp 16

## 2018-01-07 DIAGNOSIS — C773 Secondary and unspecified malignant neoplasm of axilla and upper limb lymph nodes: Secondary | ICD-10-CM

## 2018-01-07 DIAGNOSIS — C50312 Malignant neoplasm of lower-inner quadrant of left female breast: Secondary | ICD-10-CM | POA: Diagnosis not present

## 2018-01-07 DIAGNOSIS — M79601 Pain in right arm: Secondary | ICD-10-CM | POA: Diagnosis not present

## 2018-01-07 DIAGNOSIS — M25511 Pain in right shoulder: Secondary | ICD-10-CM | POA: Diagnosis not present

## 2018-01-07 DIAGNOSIS — F419 Anxiety disorder, unspecified: Secondary | ICD-10-CM

## 2018-01-07 DIAGNOSIS — Z95828 Presence of other vascular implants and grafts: Secondary | ICD-10-CM

## 2018-01-07 DIAGNOSIS — Z87891 Personal history of nicotine dependence: Secondary | ICD-10-CM

## 2018-01-07 DIAGNOSIS — G4700 Insomnia, unspecified: Secondary | ICD-10-CM

## 2018-01-07 DIAGNOSIS — Z5111 Encounter for antineoplastic chemotherapy: Secondary | ICD-10-CM | POA: Diagnosis not present

## 2018-01-07 NOTE — Progress Notes (Signed)
Symptom Management Consult note [facility]  [phone] [phone]  Patient Care Team: Jerrol Banana., MD as PCP - General (Family Medicine) Minna Merritts, MD as Consulting Physician (Cardiology)   Name of the [name]  [id number]  21-Oct-1970   Date of visit: 01/07/2018  Diagnosis: Breast cancer; clinical stage IIIb triple negative  Chief Complaint: Port a cath swelling   Current Treatment: s/p cycle 1 of dose dense Adriamycin and Cytoxan.  Oncology History: Patient was last seen and evaluated by primary medical oncologist Dr. Grayland Ormond on 01/03/2018 prior to initiation of cycle 1 dose dense Adriamycin and Cytoxan with Neulasta support.  At that visit, she continued to be anxious but otherwise was doing well.  They proceeded with cycle 1 and she was scheduled to return to clinic in 2 days for Neulasta.  Plan to assess tolerance of cycle 1 in 1 week with laboratory work.   Initially seen by nurse midwife Melody Gayla Medicus at gynecologist office for left tender breast mass.  Had breast ultrasound and and mammogram revealing suspicious palpable mass of left breast requiring biopsy.  Biopsy revealed grade 3 invasive ductal carcinoma.  Positive metastatic carcinoma and left axillary lymph node.  Met with Dr. Grayland Ormond on 12/24/2017 where he recommended neoadjuvant chemotherapy using Adriamycin, Cytoxan with Neulasta support followed by carbo/Taxol.  XRT may not be needed given patient would like mastectomy of left breast.   Had port placed by Dr. Bary Castilla on 12/31/2017.  CT abdomen/pelvis/chest with contrast on 01/02/2018 did not reveal metastatic disease.  Pretreatment MUGA revealed an EF of 71%.  Oncology History   Initially seen by nurse midwife Melody Gayla Medicus at gynecologist office for left tender breast mass.  Had breast ultrasound and and mammogram revealing suspicious palpable mass of left breast requiring biopsy.   Biopsy revealed grade 3 invasive ductal carcinoma.  Positive metastatic carcinoma and left axillary lymph node.  Met with Dr. Grayland Ormond on 12/24/2017 where he recommended neoadjuvant chemotherapy using Adriamycin, Cytoxan with Neulasta support followed by carbo/Taxol.  XRT may not be needed given patient would like mastectomy of left breast.   Had port placed by Dr. Bary Castilla on 12/31/2017.  CT abdomen/pelvis/chest with contrast on 01/02/2018 did not reveal metastatic disease.  Pretreatment MUGA revealed an EF of 71%.      Primary cancer of lower-inner quadrant of left female breast ([place])   12/23/2017 Initial Diagnosis    Primary cancer of lower-inner quadrant of left female breast ([place])    12/24/2017 Cancer Staging    Staging form: Breast, AJCC 8th Edition - Clinical stage from 12/24/2017: Stage IIIB (cT2, cN1, cM0, G3, ER-, PR-, HER2-) - Signed by Lloyd Huger, MD on 12/24/2017    12/24/2017 -  Chemotherapy    The patient had DOXOrubicin (ADRIAMYCIN) chemo injection 104 mg, 60 mg/m2 = 104 mg, Intravenous,  Once, 1 of 4 cycles Administration: 104 mg (01/03/2018) palonosetron (ALOXI) injection 0.25 mg, 0.25 mg, Intravenous,  Once, 1 of 8 cycles Administration: 0.25 mg (01/03/2018) pegfilgrastim-cbqv (UDENYCA) injection 6 mg, 6 mg, Subcutaneous, Once, 1 of 4 cycles Administration: 6 mg (01/04/2018) CARBOplatin (PARAPLATIN) in sodium chloride 0.9 % 100 mL chemo infusion,  (original dose ), Intravenous,  Once, 0 of 4 cycles Dose modification:   (Cycle 5) cyclophosphamide (CYTOXAN) 1,000 mg in sodium chloride 0.9 % 250 mL chemo infusion, 1,040 mg, Intravenous,  Once, 1 of 4 cycles Administration: 1,000 mg (01/03/2018) PACLitaxel (TAXOL) 138 mg in sodium chloride 0.9 %  250 mL chemo infusion (</= 37m/m2), 80 mg/m2, Intravenous,  Once, 0 of 4 cycles fosaprepitant (EMEND) 150 mg, dexamethasone (DECADRON) 12 mg in sodium chloride 0.9 % 145 mL IVPB, , Intravenous,  Once, 1 of 8  cycles Administration:  (01/03/2018)  for chemotherapy treatment.      Subjective Data:  ECOG: 1 - Symptomatic but completely ambulatory   Shelby MATHISENis a 47y.o. female complaints of right shoulder/arm pain.The pain is described as aching and shooting.  The onset of the pain was sudden, starting about 4 days ago.  The pain occurs intermittently and is non reproducable and lasts 1 second.  Location is generalized and radiates to right breast. Symptoms are aggravated by nothing and are completely random. Symptoms are diminished with rest.   Limited activities include: Problems with sleeping d/t the inability to sleep on right arm.  This is the same side where recent Port-A-Cath was placed.  Notes tenderness and swelling to Port-A-Cath site.  Port-A-Cath placed on 12/31/2017 by Dr. BTollie Pizza   Past Medical History:  Diagnosis Date  . Anemia    h/o with pregnancy  . Anxiety   . Asthma    allergy induced-no inhaler  . [place]   . Complication of anesthesia   . Environmental allergies   . Family history of adverse reaction to anesthesia    son-breathing problems-coded 1st time when he was 2 and had another surgery at 89and had to be admitted for breathing problems  . Family history of breast cancer   . GERD (gastroesophageal reflux disease)   . Headache    migraines  . Hypertension   . Mitral valve disease   . Mitral valve prolapse   . Painful menstrual periods   . PONV (postoperative nausea and vomiting)   . Vertigo    Past Surgical History:  Procedure Laterality Date  . ABDOMINAL HYSTERECTOMY  2010   supracervical   . BUNIONECTOMY Bilateral   . MANDIBLE RECONSTRUCTION     age 60-underbite  . PORTACATH PLACEMENT Right 12/31/2017   Procedure: INSERTION PORT-A-CATH;  Surgeon: BRobert Bellow MD;  Location: [facility] ORS;  Service: General;  Laterality: Right;  . SHOULDER ARTHROSCOPY WITH OPEN ROTATOR CUFF REPAIR Right 04/26/2017   Procedure: SHOULDER ARTHROSCOPY WITH  OPEN ROTATOR CUFF REPAIR,SUBACROMINAL DECOMPRESSION;  Surgeon: KThornton Park MD;  Location: [facility] ORS;  Service: Orthopedics;  Laterality: Right;  . TONSILLECTOMY      Current Outpatient Medications:  .  Cholecalciferol (VITAMIN D) 50 MCG (2000 UT) CAPS, Take 2,000 Units by mouth daily., Disp: , Rfl:  .  famotidine (PEPCID) 20 MG tablet, , Disp: , Rfl: 0 .  ibuprofen (ADVIL,MOTRIN) 200 MG tablet, Take 400 mg by mouth daily as needed for moderate pain., Disp: , Rfl:  .  L-Methylfolate 15 MG TABS, TAKE 1/2 TABLET (7.5 MG) BY MOUTH DAILY (Patient taking differently: Take 7.5 mg by mouth daily. ), Disp: 45 tablet, Rfl: 3 .  lidocaine-prilocaine (EMLA) cream, Apply to affected area once, Disp: 30 g, Rfl: 3 .  losartan (COZAAR) 50 MG tablet, TAKE 1 TABLET BY MOUTH DAILY, Disp: 90 tablet, Rfl: 3 .  ondansetron (ZOFRAN) 8 MG tablet, Take 1 tablet (8 mg total) by mouth 2 (two) times daily as needed., Disp: 30 tablet, Rfl: 2 .  tretinoin (RETIN-A) 0.1 % cream, Apply 1 application topically at bedtime., Disp: , Rfl:  .  ALPRAZolam (XANAX) 0.25 MG tablet, Take 1 tablet (0.25 mg total) by mouth 2 (two) times  daily as needed for anxiety. (Patient not taking: Reported on 01/07/2018), Disp: 60 tablet, Rfl: 0 .  Ascorbic Acid (VITAMIN C PO), Take 1 tablet by mouth daily. , Disp: , Rfl:  .  diazepam (VALIUM) 5 MG tablet, Take 1 tablet (5 mg total) by mouth every 6 (six) hours as needed for anxiety. (Patient not taking: Reported on 01/07/2018), Disp: 30 tablet, Rfl: 0 .  EPINEPHrine 0.3 mg/0.3 mL IJ SOAJ injection, Inject 0.3 mLs (0.3 mg total) into the muscle once. (Patient not taking: Reported on 01/07/2018), Disp: 1 Device, Rfl: 12 .  fluticasone (FLONASE) 50 MCG/ACT nasal spray, Place 1 spray into both nostrils daily as needed for allergies or rhinitis., Disp: , Rfl:  .  montelukast (SINGULAIR) 10 MG tablet, Take 1 tablet (10 mg total) by mouth at bedtime. (Patient not taking: Reported on 01/07/2018), Disp: 30  tablet, Rfl: 12 .  Probiotic CAPS, Take 1 capsule by mouth at bedtime., Disp: , Rfl:  .  prochlorperazine (COMPAZINE) 10 MG tablet, Take 1 tablet (10 mg total) by mouth every 6 (six) hours as needed (Nausea or vomiting). (Patient not taking: Reported on 01/07/2018), Disp: 60 tablet, Rfl: 2 Allergies  Allergen Reactions  . Hydrocodone-Acetaminophen Hives    swollen face  . Iodine Hives  . Peanut Butter Flavor Other (See Comments)    Made mouth tingle  . Shellfish Allergy Hives    reports that she quit smoking about 10 years ago. Her smoking use included cigarettes. She has a 5.00 pack-year smoking history. She has never used smokeless tobacco. She reports that she drinks alcohol. She reports that she does not use drugs. Family History  Problem Relation Age of Onset  . Breast cancer Mother 72  . Diabetes Father   . Cancer Maternal Uncle        colon  . Breast cancer Maternal Grandmother 70   Review of Systems  Constitutional: Negative.  Negative for chills, fever, malaise/fatigue and weight loss.  HENT: Positive for ear pain (right sided ). Negative for congestion and tinnitus.   Eyes: Negative.  Negative for blurred vision and double vision.  Respiratory: Negative.  Negative for cough, sputum production and shortness of breath.   Cardiovascular: Negative.  Negative for chest pain, palpitations and leg swelling.  Gastrointestinal: Negative.  Negative for abdominal pain, constipation, diarrhea, nausea and vomiting.  Genitourinary: Negative for dysuria, frequency and urgency.  Musculoskeletal: Positive for myalgias. Negative for back pain and falls.       Right arm pain/sensation  Skin: Negative.  Negative for rash.  Neurological: Negative.  Negative for weakness and headaches.  Endo/Heme/Allergies: Negative.  Does not bruise/bleed easily.  Psychiatric/Behavioral: Negative for depression. The patient is nervous/anxious and has insomnia.    CBC Latest Ref Rng & Units 01/03/2018 08/13/2017  04/23/2017  WBC 4.0 - 10.5 K/uL 6.4 9.6 4.8  Hemoglobin 12.0 - 15.0 g/dL 12.5 11.8 12.8  Hematocrit 36.0 - 46.0 % 36.8 35.0 38.2  Platelets 150 - 400 K/uL 252 198 225   CMP Latest Ref Rng & Units 01/03/2018 01/01/2018 06/22/2017  Glucose 70 - 99 mg/dL 116(H) - 93  BUN 6 - 20 mg/dL 14 - 17  Creatinine 0.44 - 1.00 mg/dL 0.64 0.60 0.86  Sodium 135 - 145 mmol/L 138 - 137  Potassium 3.5 - 5.1 mmol/L 3.4(L) - 4.0  Chloride 98 - 111 mmol/L 100 - 98  CO2 22 - 32 mmol/L 29 - 24  Calcium 8.9 - 10.3 mg/dL 9.7 - 10.1  Total Protein 6.5 - 8.1 g/dL 7.3 - 7.2  Total Bilirubin 0.3 - 1.2 mg/dL 0.9 - 0.4  Alkaline Phos 38 - 126 U/L 37(L) - 51  AST 15 - 41 U/L 17 - 18  ALT 0 - 44 U/L 11 - 11    Physical Exam  Constitutional: She is oriented to person, place, and time. Vital signs are normal. She appears well-developed and well-nourished.  HENT:  Head: Normocephalic and atraumatic.  Eyes: Pupils are equal, round, and reactive to light.  Neck: Normal range of motion.  Cardiovascular: Normal rate, regular rhythm and normal heart sounds.  No murmur heard. Pulmonary/Chest: Effort normal and breath sounds normal. She has no wheezes. Left breast exhibits tenderness. There is breast tenderness.  Intermittent shooting pain to right breast extending into right arm.  Port-A-Cath site mildly protruding with edema from recent placement.  Left breast intermittent tingling incision.    Abdominal: Soft. Normal appearance and bowel sounds are normal. She exhibits no distension. There is no tenderness.  Musculoskeletal: Normal range of motion. She exhibits no edema.  Neurological: She is alert and oriented to person, place, and time.  Skin: Skin is warm and dry. No rash noted.  Psychiatric: Judgment normal.   Assessment and plan:  Patient presents to [facility] for complaints of Port-A-Cath edema and intermittent shooting pain to right arm and in left breast.  Triple negative breast cancer to left breast: Recently self  palpated tender mass to left breast.  Seen by gynecologist where imaging was performed revealing concerns for breast cancer.  Subsequently had biopsy revealing triple negative breast cancer.  1 axillary lymph node positive for metastatic disease.  She is s/p recent Port-A-Cath placement on 12/31/2017 and cycle 1 Adriamycin/Cytoxan 01/03/2018.  Plan is for 4 cycles Adriamycin/Cytoxan followed by carbo/Taxol.  Patient would prefer mastectomy so uncertain and if she will require XRT.  She is scheduled to return to clinic on 01/10/2018 for lab work and to assess tolerance of cycle 1.  Cycle 2 scheduled for 01/17/2018.  Right shooting arm pain and Port-A-Cath tenderness: On exam, Port-A-Cath does not appear infected.  Right arm does not appear swollen or hot to touch. Shooting sensation is likely related to recent Port-A-Cath placement where never endings were manipulated.  Port-A-Cath site clean dry and intact mild edema.  Port-A-Cath protruding slightly likely d/t how thin patient is. No clinical indications for imaging to rule out blood clot in upper extremity.  The most common thrombus for patient's with central catheters is the jugular or subclavian vein.  Patient's often have symptoms such as the inability to draw blood from catheter, phlebitis, extremity edema, embolization and/or some have no symptoms at all.  Discussed with Dr. Grayland Ormond, and given the recent Port-A-Cath placement and intermittent shooting pain will wait a few additional days to see if symptoms improve after Port-A-Cath heals.  If shooting pain becomes more consistent and or worsens, will get ultrasound of right arm including jugular vein.  Patient in agreement with plan.  Spoke at length about thrombus prevention including antithrombotic catheter lock solution such as heparin.  Insomnia: Likely multifactorial.  History of anxiety and recent cancer diagosis.  Previously prescribed Valium and recently prescribed Xanax by Dr. Grayland Ormond.  Patient  states she prefers the Valium and is continuing that at night.  Worried that Xanax may make her "drowsy" in the mornings.  Unable to work with benzodiazepines.  Asking about daily antianxiety medication.  Requested patient speak to PCP about starting an SSRI or  something similar.   Greater than 50% was spent in counseling and coordination of care with this patient including but not limited to discussion of the relevant topics above (See A&P) including, but not limited to diagnosis and management of acute and chronic medical conditions.   Faythe Casa, NP 01/08/2018 12:05 PM

## 2018-01-07 NOTE — Telephone Encounter (Signed)
Dr Bary Castilla inserted it and she will be coming at [facility] today

## 2018-01-07 NOTE — Telephone Encounter (Signed)
perfect

## 2018-01-07 NOTE — Telephone Encounter (Signed)
Patient called reporting that she had her first chemotherapy last Thursday and her port was inserted last Monday, she is experiencing pain and puffiness at her port site and pain down into the middle of her breast. She is unable to even lie on the side. Please advise if she needs to be seen in evaluation of her port site

## 2018-01-07 NOTE — Telephone Encounter (Signed)
Yes definitely. Do you know who put it in? I would be happy to see her.   Faythe Casa, NP 01/07/2018 12:13 PM

## 2018-01-10 ENCOUNTER — Other Ambulatory Visit: Payer: Self-pay

## 2018-01-10 ENCOUNTER — Inpatient Hospital Stay: Payer: No Typology Code available for payment source

## 2018-01-10 ENCOUNTER — Inpatient Hospital Stay (HOSPITAL_BASED_OUTPATIENT_CLINIC_OR_DEPARTMENT_OTHER): Payer: No Typology Code available for payment source | Admitting: Oncology

## 2018-01-10 VITALS — BP 145/95 | HR 84 | Temp 98.7°F | Ht 66.0 in | Wt 140.2 lb

## 2018-01-10 DIAGNOSIS — C50312 Malignant neoplasm of lower-inner quadrant of left female breast: Secondary | ICD-10-CM

## 2018-01-10 DIAGNOSIS — H9201 Otalgia, right ear: Secondary | ICD-10-CM

## 2018-01-10 DIAGNOSIS — F419 Anxiety disorder, unspecified: Secondary | ICD-10-CM

## 2018-01-10 DIAGNOSIS — Z5111 Encounter for antineoplastic chemotherapy: Secondary | ICD-10-CM | POA: Diagnosis not present

## 2018-01-10 DIAGNOSIS — D696 Thrombocytopenia, unspecified: Secondary | ICD-10-CM | POA: Diagnosis not present

## 2018-01-10 DIAGNOSIS — Z171 Estrogen receptor negative status [ER-]: Secondary | ICD-10-CM | POA: Diagnosis not present

## 2018-01-10 DIAGNOSIS — D701 Agranulocytosis secondary to cancer chemotherapy: Secondary | ICD-10-CM | POA: Diagnosis not present

## 2018-01-10 LAB — COMPREHENSIVE METABOLIC PANEL
ALT: 12 U/L (ref 0–44)
AST: 17 U/L (ref 15–41)
Albumin: 4.5 g/dL (ref 3.5–5.0)
Alkaline Phosphatase: 60 U/L (ref 38–126)
Anion gap: 8 (ref 5–15)
BUN: 12 mg/dL (ref 6–20)
CO2: 28 mmol/L (ref 22–32)
Calcium: 9.7 mg/dL (ref 8.9–10.3)
Chloride: 98 mmol/L (ref 98–111)
Creatinine, Ser: 0.58 mg/dL (ref 0.44–1.00)
GFR calc Af Amer: >60 mL/min (ref >60)
GFR calc non Af Amer: >60 mL/min (ref >60)
Glucose, Bld: 104 mg/dL — ABNORMAL HIGH (ref 70–99)
Potassium: 3.9 mmol/L (ref 3.5–5.1)
Sodium: 134 mmol/L — ABNORMAL LOW (ref 135–145)
Total Bilirubin: 0.9 mg/dL (ref 0.3–1.2)
Total Protein: 7.1 g/dL (ref 6.5–8.1)

## 2018-01-10 LAB — CBC WITH DIFFERENTIAL/PLATELET
Abs Immature Granulocytes: 0.01 10*3/uL (ref 0.00–0.07)
Basophils Absolute: 0 10*3/uL (ref 0.0–0.1)
Basophils Relative: 3 %
Eosinophils Absolute: 0.2 10*3/uL (ref 0.0–0.5)
Eosinophils Relative: 13 %
HCT: 35.6 % — ABNORMAL LOW (ref 36.0–46.0)
Hemoglobin: 11.9 g/dL — ABNORMAL LOW (ref 12.0–15.0)
Immature Granulocytes: 1 %
Lymphocytes Relative: 47 %
Lymphs Abs: 0.5 10*3/uL — ABNORMAL LOW (ref 0.7–4.0)
MCH: 29.8 pg (ref 26.0–34.0)
MCHC: 33.4 g/dL (ref 30.0–36.0)
MCV: 89 fL (ref 80.0–100.0)
MONOS PCT: 8 %
Monocytes Absolute: 0.1 10*3/uL (ref 0.1–1.0)
Neutro Abs: 0.3 10*3/uL — ABNORMAL LOW (ref 1.7–7.7)
Neutrophils Relative %: 28 %
PLATELETS: 139 10*3/uL — AB (ref 150–400)
RBC: 4 MIL/uL (ref 3.87–5.11)
RDW: 11.4 % — ABNORMAL LOW (ref 11.5–15.5)
WBC: 1.1 10*3/uL — CL (ref 4.0–10.5)
nRBC: 0 % (ref 0.0–0.2)

## 2018-01-10 NOTE — Progress Notes (Signed)
Patient is here today to follow up on her left breast cancer. Patient stated that she has had right ear pain for 2 days and left upper rib pain ever since she had the biopsy done. Patient stated that she had nausea on Saturday (12-7) but took her nausea medicine and it helped. Patient had lost 5 lbs since her last visit (01-03-18). On that day we put in the wrong weight, patient was 145 lbs per patient. Patient denied fever, chills, vomiting, diarrhea, constipation and SOB.

## 2018-01-13 NOTE — Progress Notes (Signed)
[place]  [phone] [phone]  ID: Shelby [name] OB: 10/03/1970  MR#: [id number]  SXQ#:[id number]  Patient Care Team: Jerrol Banana., MD as PCP - General (Family Medicine) [name], MD as Consulting Physician (Cardiology)  CHIEF COMPLAINT: Clinical stage IIIB triple negative invasive carcinoma of the lower inner quadrant of the left breast.  INTERVAL HISTORY: Patient returns to clinic today for further evaluation and consideration of cycle 2 of Adriamycin and Cytoxan.  She has multiple complaints, but admits these may be related to her anxiety.  She otherwise feels well.  She has occasional headache, but no other no neurologic complaints.  She denies any recent fevers or illnesses.  She has a good appetite and denies weight loss.  She has no chest pain or shortness of breath.  She has occasional nausea but denies any vomiting, constipation, or diarrhea.  She has no urinary complaints.  Patient offers no further specific complaints today.  REVIEW OF SYSTEMS:   Review of Systems  Constitutional: Negative.  Negative for fever, malaise/fatigue and weight loss.  HENT: Positive for ear pain.   Respiratory: Negative.  Negative for cough, hemoptysis and shortness of breath.   Cardiovascular: Negative.  Negative for chest pain and leg swelling.  Gastrointestinal: Negative.  Negative for abdominal pain and melena.  Genitourinary: Negative.  Negative for dysuria.  Musculoskeletal: Negative.  Negative for back pain.  Skin: Negative.  Negative for rash.  Neurological: Positive for headaches. Negative for dizziness, focal weakness and weakness.  Psychiatric/Behavioral: The patient is nervous/anxious.     As per HPI. Otherwise, a complete review of systems is negative.  PAST MEDICAL HISTORY: Past Medical History:  Diagnosis Date  . Anemia    h/o with pregnancy  . Anxiety   . Asthma    allergy induced-no inhaler  . Cancer ([place])     . Complication of anesthesia   . Environmental allergies   . Family history of adverse reaction to anesthesia    son-breathing problems-coded 1st time when he was 2 and had another surgery at 27 and had to be admitted for breathing problems  . Family history of breast cancer   . GERD (gastroesophageal reflux disease)   . Headache    migraines  . Hypertension   . Mitral valve disease   . Mitral valve prolapse   . Painful menstrual periods   . PONV (postoperative nausea and vomiting)   . Vertigo     PAST SURGICAL HISTORY: Past Surgical History:  Procedure Laterality Date  . ABDOMINAL HYSTERECTOMY  2010   supracervical   . BUNIONECTOMY Bilateral   . MANDIBLE RECONSTRUCTION     age 32-underbite  . PORTACATH PLACEMENT Right 12/31/2017   Procedure: INSERTION PORT-A-CATH;  Surgeon: Robert Bellow, MD;  Location: [facility] ORS;  Service: General;  Laterality: Right;  . SHOULDER ARTHROSCOPY WITH OPEN ROTATOR CUFF REPAIR Right 04/26/2017   Procedure: SHOULDER ARTHROSCOPY WITH OPEN ROTATOR CUFF REPAIR,SUBACROMINAL DECOMPRESSION;  Surgeon: Thornton Park, MD;  Location: [facility] ORS;  Service: Orthopedics;  Laterality: Right;  . TONSILLECTOMY      FAMILY HISTORY: Family History  Problem Relation Age of Onset  . Breast cancer Mother 26  . Diabetes Father   . Cancer Maternal Uncle        colon  . Breast cancer Maternal Grandmother 67    ADVANCED DIRECTIVES (Y/N):  N  HEALTH MAINTENANCE: Social History   Tobacco Use  . Smoking status: Former Smoker  Packs/day: 0.50    Years: 10.00    Pack years: 5.00    Types: Cigarettes    Last attempt to quit: 04/20/2007    Years since quitting: 10.7  . Smokeless tobacco: Never Used  . Tobacco comment: social smoker back then  Substance Use Topics  . Alcohol use: Yes    Comment: occas  . Drug use: No     Colonoscopy:  PAP:  Bone density:  Lipid panel:  Allergies  Allergen Reactions  . Hydrocodone-Acetaminophen Hives    swollen  face  . Iodine Hives  . Peanut Butter Flavor Other (See Comments)    Made mouth tingle  . Shellfish Allergy Hives    Current Outpatient Medications  Medication Sig Dispense Refill  . ALPRAZolam (XANAX) 0.25 MG tablet Take 1 tablet (0.25 mg total) by mouth 2 (two) times daily as needed for anxiety. 60 tablet 0  . Ascorbic Acid (VITAMIN C PO) Take 1 tablet by mouth daily.     . Cholecalciferol (VITAMIN D) 50 MCG (2000 UT) CAPS Take 2,000 Units by mouth daily.    . diazepam (VALIUM) 5 MG tablet Take 1 tablet (5 mg total) by mouth every 6 (six) hours as needed for anxiety. 30 tablet 0  . docusate sodium (COLACE) 100 MG capsule Take 100 mg by mouth 2 (two) times daily.    Marland Kitchen EPINEPHrine 0.3 mg/0.3 mL IJ SOAJ injection Inject 0.3 mLs (0.3 mg total) into the muscle once. 1 Device 12  . famotidine (PEPCID) 20 MG tablet   0  . fluticasone (FLONASE) 50 MCG/ACT nasal spray Place 1 spray into both nostrils daily as needed for allergies or rhinitis.    Marland Kitchen ibuprofen (ADVIL,MOTRIN) 200 MG tablet Take 400 mg by mouth daily as needed for moderate pain.    Marland Kitchen L-Methylfolate 15 MG TABS TAKE 1/2 TABLET (7.5 MG) BY MOUTH DAILY (Patient taking differently: Take 7.5 mg by mouth daily. ) 45 tablet 3  . lidocaine-prilocaine (EMLA) cream Apply to affected area once 30 g 3  . losartan (COZAAR) 50 MG tablet TAKE 1 TABLET BY MOUTH DAILY 90 tablet 3  . montelukast (SINGULAIR) 10 MG tablet Take 1 tablet (10 mg total) by mouth at bedtime. 30 tablet 12  . ondansetron (ZOFRAN) 8 MG tablet Take 1 tablet (8 mg total) by mouth 2 (two) times daily as needed. 30 tablet 2  . Probiotic CAPS Take 1 capsule by mouth at bedtime.    . prochlorperazine (COMPAZINE) 10 MG tablet Take 1 tablet (10 mg total) by mouth every 6 (six) hours as needed (Nausea or vomiting). 60 tablet 2  . tretinoin (RETIN-A) 0.1 % cream Apply 1 application topically at bedtime.     No current facility-administered medications for this visit.      OBJECTIVE: Vitals:   01/17/18 0919  BP: (!) 151/104  Pulse: (!) 111  Temp: (!) 97.3 F (36.3 C)     Body mass index is 22.6 kg/m.    ECOG FS:0 - Asymptomatic  General: Well-developed, well-nourished, no acute distress. Eyes: Pink conjunctiva, anicteric sclera. HEENT: Normocephalic, moist mucous membranes. Breast: Easily palpable left breast mass, exam deferred today. Lungs: Clear to auscultation bilaterally. Heart: Regular rate and rhythm. No rubs, murmurs, or gallops. Abdomen: Soft, nontender, nondistended. No organomegaly noted, normoactive bowel sounds. Musculoskeletal: No edema, cyanosis, or clubbing. Neuro: Alert, answering all questions appropriately. Cranial nerves grossly intact. Skin: No rashes or petechiae noted. Psych: Normal affect.  LAB RESULTS:  Lab Results  Component Value  Date   NA 139 01/17/2018   K 3.9 01/17/2018   CL 104 01/17/2018   CO2 26 01/17/2018   GLUCOSE 108 (H) 01/17/2018   BUN 12 01/17/2018   CREATININE 0.74 01/17/2018   CALCIUM 9.9 01/17/2018   PROT 7.2 01/17/2018   ALBUMIN 4.5 01/17/2018   AST 18 01/17/2018   ALT 13 01/17/2018   ALKPHOS 62 01/17/2018   BILITOT 0.4 01/17/2018   GFRNONAA >60 01/17/2018   GFRAA >60 01/17/2018    Lab Results  Component Value Date   WBC 13.2 (H) 01/17/2018   NEUTROABS 9.6 (H) 01/17/2018   HGB 12.0 01/17/2018   HCT 35.4 (L) 01/17/2018   MCV 88.7 01/17/2018   PLT 172 01/17/2018     STUDIES: Ct Chest W Contrast  Result Date: 01/02/2018 CLINICAL DATA:  Left-sided breast cancer. Asymptomatic. Prior hysterectomy. EXAM: CT CHEST, ABDOMEN, AND PELVIS WITH CONTRAST TECHNIQUE: Multidetector CT imaging of the chest, abdomen and pelvis was performed following the standard protocol during bolus administration of intravenous contrast. CONTRAST:  135m ISOVUE-300 IOPAMIDOL (ISOVUE-300) INJECTION 61% COMPARISON:  11/25/2012 noncontrast abdominopelvic CT. Chest radiograph 12/31/2017. FINDINGS: CT CHEST FINDINGS  Cardiovascular: Right Port-A-Cath tip high right atrium. Normal heart size, without pericardial effusion. No central pulmonary embolism, on this non-dedicated study. Mediastinum/Nodes: No supraclavicular adenopathy. No subpectoral or axillary adenopathy. No mediastinal or hilar adenopathy. No internal mammary adenopathy. Lungs/Pleura: No pleural fluid. Bibasilar scarring or subsegmental atelectasis. Musculoskeletal: Nodularity within the inferomedial aspect of the left breast/chest wall, including at 1.4 cm on image 41/2, likely representing the primary. No acute osseous abnormality. CT ABDOMEN PELVIS FINDINGS Hepatobiliary: Low-density high right hepatic lobe lesion image 48/2 is felt to be subtly present back in 2014, favoring a benign etiology. Normal gallbladder, without biliary ductal dilatation. Pancreas: Normal, without mass or ductal dilatation. Spleen: Normal in size, without focal abnormality. Adrenals/Urinary Tract: Normal adrenal glands. Normal kidneys, without hydronephrosis. Normal urinary bladder. Stomach/Bowel: Normal stomach, without wall thickening. Normal colon and terminal ileum. Normal small bowel. Vascular/Lymphatic: Normal caliber of the aorta and branch vessels. No abdominopelvic adenopathy. Reproductive: Hysterectomy.  No adnexal mass. Other: No significant free fluid. Mild pelvic floor laxity. No evidence of omental or peritoneal disease. Suspect at least 1 tiny fat containing ventral abdominal wall hernia, including on image 76/2. Musculoskeletal: Tiny sclerotic lesions within the pelvis are most likely bone islands. IMPRESSION: 1. Inferior medial left breast nodularity likely represents the primary lesion. No evidence of metastatic disease in the chest, abdomen, or pelvis. 2. Probable bone islands throughout the pelvis. Recommend attention on follow-up. Electronically Signed   By: KAbigail MiyamotoM.D.   On: 01/02/2018 10:40   Nm Cardiac Muga Rest  Result Date: 01/01/2018 CLINICAL DATA:   Breast cancer. Evaluate cardiac function in relation to chemotherapy. EXAM: NUCLEAR MEDICINE CARDIAC BLOOD POOL IMAGING (MUGA) TECHNIQUE: Cardiac multi-gated acquisition was performed at rest following intravenous injection of Tc-966mabeled red blood cells. RADIOPHARMACEUTICALS:  21.8 mCi [id number] in-vitro labeled red blood cells IV COMPARISON:  None. FINDINGS: No  focal wall motion abnormality of the left ventricle. Calculated left ventricular ejection fraction equals 71% IMPRESSION: Left ventricular ejection fraction equals71 %. Electronically Signed   By: SteSuzy BouchardD.   On: 01/01/2018 16:14   Ct Abdomen Pelvis W Contrast  Result Date: 01/02/2018 CLINICAL DATA:  Left-sided breast cancer. Asymptomatic. Prior hysterectomy. EXAM: CT CHEST, ABDOMEN, AND PELVIS WITH CONTRAST TECHNIQUE: Multidetector CT imaging of the chest, abdomen and pelvis was performed following the standard protocol during  bolus administration of intravenous contrast. CONTRAST:  161m ISOVUE-300 IOPAMIDOL (ISOVUE-300) INJECTION 61% COMPARISON:  11/25/2012 noncontrast abdominopelvic CT. Chest radiograph 12/31/2017. FINDINGS: CT CHEST FINDINGS Cardiovascular: Right Port-A-Cath tip high right atrium. Normal heart size, without pericardial effusion. No central pulmonary embolism, on this non-dedicated study. Mediastinum/Nodes: No supraclavicular adenopathy. No subpectoral or axillary adenopathy. No mediastinal or hilar adenopathy. No internal mammary adenopathy. Lungs/Pleura: No pleural fluid. Bibasilar scarring or subsegmental atelectasis. Musculoskeletal: Nodularity within the inferomedial aspect of the left breast/chest wall, including at 1.4 cm on image 41/2, likely representing the primary. No acute osseous abnormality. CT ABDOMEN PELVIS FINDINGS Hepatobiliary: Low-density high right hepatic lobe lesion image 48/2 is felt to be subtly present back in 2014, favoring a benign etiology. Normal gallbladder, without biliary  ductal dilatation. Pancreas: Normal, without mass or ductal dilatation. Spleen: Normal in size, without focal abnormality. Adrenals/Urinary Tract: Normal adrenal glands. Normal kidneys, without hydronephrosis. Normal urinary bladder. Stomach/Bowel: Normal stomach, without wall thickening. Normal colon and terminal ileum. Normal small bowel. Vascular/Lymphatic: Normal caliber of the aorta and branch vessels. No abdominopelvic adenopathy. Reproductive: Hysterectomy.  No adnexal mass. Other: No significant free fluid. Mild pelvic floor laxity. No evidence of omental or peritoneal disease. Suspect at least 1 tiny fat containing ventral abdominal wall hernia, including on image 76/2. Musculoskeletal: Tiny sclerotic lesions within the pelvis are most likely bone islands. IMPRESSION: 1. Inferior medial left breast nodularity likely represents the primary lesion. No evidence of metastatic disease in the chest, abdomen, or pelvis. 2. Probable bone islands throughout the pelvis. Recommend attention on follow-up. Electronically Signed   By: KAbigail MiyamotoM.D.   On: 01/02/2018 10:40   Dg Chest Port 1 View  Result Date: 12/31/2017 CLINICAL DATA:  Status post Port-A-Cath placement. EXAM: PORTABLE CHEST 1 VIEW COMPARISON:  03/10/2015 FINDINGS: A right subclavian Port-A-Cath has been placed and terminates at the superior cavoatrial junction. The cardiomediastinal silhouette is within normal limits. The lungs are well inflated and clear. No pleural effusion or pneumothorax is identified. No acute osseous abnormality is seen. IMPRESSION: Port-A-Cath as above. No evidence of pneumothorax or acute airspace disease. Electronically Signed   By: ALogan BoresM.D.   On: 12/31/2017 10:26   Dg C-arm 1-60 Min-no Report  Result Date: 12/31/2017 Fluoroscopy was utilized by the requesting physician.  No radiographic interpretation.   [place]Axillary Node Core Biopsy Left  Addendum Date: 12/21/2017   ADDENDUM REPORT: 12/21/2017 12:38  ADDENDUM: Pathology revealed GRADE III INVASIVE DUCTAL CARCINOMA, DUCTAL CARCINOMA IN SITU, LYMPHOVASCULAR INVASION IS IDENTIFIED of the Left breast, 8 o'clock. METASTATIC CARCINOMA IN the Left axillary lymph node. This was found to be concordant by Dr. JEverlean Alstrom Pathology results will discussed with the patient by telephone by MLorelle Gibbs CNM at [facility] SGayla Medicuswill arrange a surgical referral for the patient. Recommendation for a bilateral breast MRI due to family history and extremely dense breasts. Pathology results reported by LTerie Purser RN on 12/21/2017. Electronically Signed   By: JEverlean AlstromM.D.   On: 12/21/2017 12:38   Result Date: 12/21/2017 CLINICAL DATA:  47year old female with suspicious cluster of palpable masses in the left breast at the 7 8 o'clock position as well as a suspicious lymph node in the left axilla with cortical thickening. EXAM: ULTRASOUND GUIDED LEFT BREAST CORE NEEDLE BIOPSY ULTRASOUND-GUIDED LEFT AXILLA CORE NEEDLE BIOPSY COMPARISON:  Previous exam(s). FINDINGS: I met with the patient and we discussed the procedure of ultrasound-guided biopsy, including benefits and alternatives.  We discussed the high likelihood of a successful procedure. We discussed the risks of the procedure, including infection, bleeding, tissue injury, clip migration, and inadequate sampling. Informed written consent was given. The usual time-out protocol was performed immediately prior to the procedure. SITE 1: LEFT BREAST 8 O'CLOCK: MASS: Lesion quadrant: LOWER INNER Using sterile technique and 1% Lidocaine as local anesthetic, under direct ultrasound visualization, a 12 gauge spring-loaded device was used to perform biopsy of the largest of the cluster of masses at the 8 o'clock position using a medial to lateral approach. At the conclusion of the procedure a ribbon shaped tissue marker clip was deployed into the biopsy cavity. SITE 2: LEFT  AXILLA: LYMPH NODE: Lesion quadrant: UPPER-OUTER Using sterile technique and 1% Lidocaine as local anesthetic, under direct ultrasound visualization, a 14 gauge spring-loaded device was used to perform biopsy of the lymph node in the left axilla using a inferolateral to superior medial approach. At the conclusion of the procedure a spiral shaped HydroMARK tissue marker clip was deployed into the biopsy cavity. Follow up 2 view mammogram was performed and dictated separately. IMPRESSION: 1. Ultrasound-guided biopsy of the palpable mass in the left breast at the 8 o'clock position. 2. Ultrasound-guided biopsy of a lymph node with a thickened cortex in the left axilla. Electronically Signed: By: Everlean Alstrom M.D. On: 12/20/2017 14:38   Mm Clip Placement Left  Result Date: 12/20/2017 CLINICAL DATA:  Post ultrasound-guided biopsy of a suspicious mass at the 8 o'clock position as well as biopsy of a lymph node in the left axilla demonstrating a thickened cortex. EXAM: DIAGNOSTIC LEFT MAMMOGRAM POST ULTRASOUND BIOPSY COMPARISON:  Previous exam(s). FINDINGS: Mammographic images were obtained following ultrasound guided biopsy of a suspicious mass in the left breast at the 8 o'clock position and ultrasound-guided biopsy of a suspicious lymph node in the left axilla. A ribbon shaped biopsy marking clip is present and felt to be located at the posterior margin of the biopsied mass in the left breast at the 8 o'clock position. Suspicious calcifications are noted to be present 1.5 cm lateral and superior to the biopsied mass in the left breast. The spiral shaped HydroMARK clip is present and located at the anterior margin of the biopsied lymph node in the left axilla. IMPRESSION: 1. Ribbon shaped biopsy marking clip along posterior margin of biopsied mass in the left breast. Suspicious calcifications are noted to be present 1.5 cm lateral and superior to the biopsied mass in the left breast. 2. Spiral shaped HydroMARK  clip located at anterior margin of biopsied lymph node in the left axilla. Final Assessment: Post Procedure Mammograms for Marker Placement Electronically Signed   By: Everlean Alstrom M.D.   On: 12/20/2017 14:52   [place] Lt Breast Bx W Loc Dev 1st Lesion Img Bx Spec US Guide  Addendum Date: 12/21/2017   ADDENDUM REPORT: 12/21/2017 12:38 ADDENDUM: Pathology revealed GRADE III INVASIVE DUCTAL CARCINOMA, DUCTAL CARCINOMA IN SITU, LYMPHOVASCULAR INVASION IS IDENTIFIED of the Left breast, 8 o'clock. METASTATIC CARCINOMA IN the Left axillary lymph node. This was found to be concordant by Dr. Everlean Alstrom. Pathology results will discussed with the patient by telephone by Lorelle Gibbs, CNM at [facility] in [place], [place]. [place] Gayla Medicus will arrange a surgical referral for the patient. Recommendation for a bilateral breast MRI due to family history and extremely dense breasts. Pathology results reported by Terie Purser, RN on 12/21/2017. Electronically Signed   By: Everlean Alstrom M.D.   On: 12/21/2017 12:38  Result Date: 12/21/2017 CLINICAL DATA:  47 year old female with suspicious cluster of palpable masses in the left breast at the 7 8 o'clock position as well as a suspicious lymph node in the left axilla with cortical thickening. EXAM: ULTRASOUND GUIDED LEFT BREAST CORE NEEDLE BIOPSY ULTRASOUND-GUIDED LEFT AXILLA CORE NEEDLE BIOPSY COMPARISON:  Previous exam(s). FINDINGS: I met with the patient and we discussed the procedure of ultrasound-guided biopsy, including benefits and alternatives. We discussed the high likelihood of a successful procedure. We discussed the risks of the procedure, including infection, bleeding, tissue injury, clip migration, and inadequate sampling. Informed written consent was given. The usual time-out protocol was performed immediately prior to the procedure. SITE 1: LEFT BREAST 8 O'CLOCK: MASS: Lesion quadrant: LOWER INNER Using sterile technique and 1% Lidocaine as  local anesthetic, under direct ultrasound visualization, a 12 gauge spring-loaded device was used to perform biopsy of the largest of the cluster of masses at the 8 o'clock position using a medial to lateral approach. At the conclusion of the procedure a ribbon shaped tissue marker clip was deployed into the biopsy cavity. SITE 2: LEFT AXILLA: LYMPH NODE: Lesion quadrant: UPPER-OUTER Using sterile technique and 1% Lidocaine as local anesthetic, under direct ultrasound visualization, a 14 gauge spring-loaded device was used to perform biopsy of the lymph node in the left axilla using a inferolateral to superior medial approach. At the conclusion of the procedure a spiral shaped HydroMARK tissue marker clip was deployed into the biopsy cavity. Follow up 2 view mammogram was performed and dictated separately. IMPRESSION: 1. Ultrasound-guided biopsy of the palpable mass in the left breast at the 8 o'clock position. 2. Ultrasound-guided biopsy of a lymph node with a thickened cortex in the left axilla. Electronically Signed: By: Everlean Alstrom M.D. On: 12/20/2017 14:38    ASSESSMENT: Clinical stage IIIB triple negative invasive carcinoma of the lower inner quadrant of the left breast.  PLAN:    1. Clinical stage IIIB triple negative invasive carcinoma of the lower inner quadrant of the left breast: CT scan results from January 01, 2018 did not reveal any metastatic disease.  Given patient's stage of disease, she will benefit from neoadjuvant chemotherapy using Adriamycin, Cytoxan with Udenyca support followed by carboplatinum and Taxol.  Patient has indicated she may prefer to undergo mastectomy, therefore adjuvant XRT may not be needed.  An aromatase inhibitor would not offer any benefit given the triple negative status of her disease.  MUGA scan on January 01, 2018 revealed an EF of 71%.  Proceed with cycle 2 of Adriamycin and Cytoxan today.  Return to clinic tomorrow for a Udenyca and then in 2 weeks for  further evaluation and consideration of cycle 3.   2.  Anxiety: Chronic.  Continue Xanax as needed. 3.  Neutropenia: Resolved.  Patient now has leukocytosis secondary to [facility]. 4.  Thrombocytopenia: Resolved.  Patient expressed understanding and was in agreement with this plan. She also understands that She can call clinic at any time with any questions, concerns, or complaints.   Cancer Staging Primary cancer of lower-inner quadrant of left female breast [facility]) Staging form: Breast, AJCC 8th Edition - Clinical stage from 12/24/2017: Stage IIIB (cT2, cN1, cM0, G3, ER-, PR-, HER2-) - Signed by Lloyd Huger, MD on 12/24/2017 - Pathologic: No stage assigned - Unsigned   Lloyd Huger, MD   01/18/2018 3:47 PM

## 2018-01-17 ENCOUNTER — Inpatient Hospital Stay: Payer: No Typology Code available for payment source

## 2018-01-17 ENCOUNTER — Inpatient Hospital Stay (HOSPITAL_BASED_OUTPATIENT_CLINIC_OR_DEPARTMENT_OTHER): Payer: No Typology Code available for payment source | Admitting: Oncology

## 2018-01-17 ENCOUNTER — Other Ambulatory Visit: Payer: Self-pay

## 2018-01-17 VITALS — BP 151/104 | HR 111 | Temp 97.3°F | Wt 140.0 lb

## 2018-01-17 VITALS — BP 145/83 | HR 90

## 2018-01-17 DIAGNOSIS — Z5111 Encounter for antineoplastic chemotherapy: Secondary | ICD-10-CM | POA: Diagnosis not present

## 2018-01-17 DIAGNOSIS — C50312 Malignant neoplasm of lower-inner quadrant of left female breast: Secondary | ICD-10-CM

## 2018-01-17 DIAGNOSIS — Z171 Estrogen receptor negative status [ER-]: Secondary | ICD-10-CM

## 2018-01-17 DIAGNOSIS — R110 Nausea: Secondary | ICD-10-CM | POA: Diagnosis not present

## 2018-01-17 DIAGNOSIS — Z87891 Personal history of nicotine dependence: Secondary | ICD-10-CM

## 2018-01-17 DIAGNOSIS — F419 Anxiety disorder, unspecified: Secondary | ICD-10-CM | POA: Diagnosis not present

## 2018-01-17 LAB — COMPREHENSIVE METABOLIC PANEL
ALT: 13 U/L (ref 0–44)
ANION GAP: 9 (ref 5–15)
AST: 18 U/L (ref 15–41)
Albumin: 4.5 g/dL (ref 3.5–5.0)
Alkaline Phosphatase: 62 U/L (ref 38–126)
BUN: 12 mg/dL (ref 6–20)
CHLORIDE: 104 mmol/L (ref 98–111)
CO2: 26 mmol/L (ref 22–32)
Calcium: 9.9 mg/dL (ref 8.9–10.3)
Creatinine, Ser: 0.74 mg/dL (ref 0.44–1.00)
GFR calc Af Amer: >60 mL/min (ref >60)
GFR calc non Af Amer: >60 mL/min (ref >60)
Glucose, Bld: 108 mg/dL — ABNORMAL HIGH (ref 70–99)
Potassium: 3.9 mmol/L (ref 3.5–5.1)
Sodium: 139 mmol/L (ref 135–145)
Total Bilirubin: 0.4 mg/dL (ref 0.3–1.2)
Total Protein: 7.2 g/dL (ref 6.5–8.1)

## 2018-01-17 LAB — CBC WITH DIFFERENTIAL/PLATELET
Abs Immature Granulocytes: 0.91 10*3/uL — ABNORMAL HIGH (ref 0.00–0.07)
Basophils Absolute: 0.1 10*3/uL (ref 0.0–0.1)
Basophils Relative: 1 %
Eosinophils Absolute: 0.1 10*3/uL (ref 0.0–0.5)
Eosinophils Relative: 1 %
HCT: 35.4 % — ABNORMAL LOW (ref 36.0–46.0)
Hemoglobin: 12 g/dL (ref 12.0–15.0)
IMMATURE GRANULOCYTES: 7 %
Lymphocytes Relative: 15 %
Lymphs Abs: 1.9 10*3/uL (ref 0.7–4.0)
MCH: 30.1 pg (ref 26.0–34.0)
MCHC: 33.9 g/dL (ref 30.0–36.0)
MCV: 88.7 fL (ref 80.0–100.0)
Monocytes Absolute: 0.7 10*3/uL (ref 0.1–1.0)
Monocytes Relative: 5 %
NEUTROS PCT: 71 %
Neutro Abs: 9.6 10*3/uL — ABNORMAL HIGH (ref 1.7–7.7)
Platelets: 172 10*3/uL (ref 150–400)
RBC: 3.99 MIL/uL (ref 3.87–5.11)
RDW: 11.9 % (ref 11.5–15.5)
WBC: 13.2 10*3/uL — ABNORMAL HIGH (ref 4.0–10.5)
nRBC: 0 % (ref 0.0–0.2)

## 2018-01-17 MED ORDER — SODIUM CHLORIDE 0.9 % IV SOLN
1000.0000 mg | Freq: Once | INTRAVENOUS | Status: AC
  Administered 2018-01-17: 1000 mg via INTRAVENOUS
  Filled 2018-01-17: qty 50

## 2018-01-17 MED ORDER — SODIUM CHLORIDE 0.9% FLUSH
10.0000 mL | Freq: Once | INTRAVENOUS | Status: AC
  Administered 2018-01-17: 10 mL via INTRAVENOUS
  Filled 2018-01-17: qty 10

## 2018-01-17 MED ORDER — DOXORUBICIN HCL CHEMO IV INJECTION 2 MG/ML
60.0000 mg/m2 | Freq: Once | INTRAVENOUS | Status: AC
  Administered 2018-01-17: 104 mg via INTRAVENOUS
  Filled 2018-01-17: qty 52

## 2018-01-17 MED ORDER — SODIUM CHLORIDE 0.9 % IV SOLN
Freq: Once | INTRAVENOUS | Status: AC
  Administered 2018-01-17: 10:00:00 via INTRAVENOUS
  Filled 2018-01-17: qty 5

## 2018-01-17 MED ORDER — PALONOSETRON HCL INJECTION 0.25 MG/5ML
0.2500 mg | Freq: Once | INTRAVENOUS | Status: AC
  Administered 2018-01-17: 0.25 mg via INTRAVENOUS
  Filled 2018-01-17: qty 5

## 2018-01-17 MED ORDER — SODIUM CHLORIDE 0.9 % IV SOLN
Freq: Once | INTRAVENOUS | Status: AC
  Administered 2018-01-17: 10:00:00 via INTRAVENOUS
  Filled 2018-01-17: qty 250

## 2018-01-17 MED ORDER — HEPARIN SOD (PORK) LOCK FLUSH 100 UNIT/ML IV SOLN
500.0000 [IU] | Freq: Once | INTRAVENOUS | Status: AC
  Administered 2018-01-17: 500 [IU] via INTRAVENOUS
  Filled 2018-01-17: qty 5

## 2018-01-17 NOTE — Progress Notes (Signed)
Patient is here today to follow up on her left breast. Patient also stated that she noticed left ankle edema 1 week ago, tingling on her hands, right ear ache and headaches. Patient stated that her appetite is good at this time and feeling overall good.

## 2018-01-17 NOTE — Progress Notes (Signed)
BP 151/104 and HR 111.  Ok to proceed with treatment per MD

## 2018-01-18 ENCOUNTER — Inpatient Hospital Stay: Payer: No Typology Code available for payment source

## 2018-01-18 DIAGNOSIS — Z5111 Encounter for antineoplastic chemotherapy: Secondary | ICD-10-CM | POA: Diagnosis not present

## 2018-01-18 DIAGNOSIS — C50312 Malignant neoplasm of lower-inner quadrant of left female breast: Secondary | ICD-10-CM

## 2018-01-18 MED ORDER — PEGFILGRASTIM-CBQV 6 MG/0.6ML ~~LOC~~ SOSY
6.0000 mg | PREFILLED_SYRINGE | Freq: Once | SUBCUTANEOUS | Status: AC
  Administered 2018-01-18: 6 mg via SUBCUTANEOUS

## 2018-01-25 NOTE — Progress Notes (Signed)
[place]  [phone] [phone]  ID: Shelby [name] OB: 10-27-1970  MR#: [id number]  MDY#:[id number]  Patient Care Team: Jerrol Banana., MD as PCP - General (Family Medicine) [name], MD as Consulting Physician (Cardiology)  CHIEF COMPLAINT: Clinical stage IIIB triple negative invasive carcinoma of the lower inner quadrant of the left breast.  INTERVAL HISTORY: Patient returns to clinic today for further evaluation and consideration of cycle 3 of Adriamycin and Cytoxan.  She has noticed increased congestion and slight sore throat over the past several days, but denies fever.  She otherwise feels well.  She has an occasional headache, but no other neurologic complaints.  She has a good appetite and denies weight loss.  She has no chest pain or shortness of breath.  She denies any nausea, vomiting, constipation, or diarrhea.  She has no urinary complaints.  Patient offers no further specific complaints today.  REVIEW OF SYSTEMS:   Review of Systems  Constitutional: Negative.  Negative for fever, malaise/fatigue and weight loss.  HENT: Positive for congestion and sore throat. Negative for ear pain.   Respiratory: Negative.  Negative for cough, hemoptysis and shortness of breath.   Cardiovascular: Negative.  Negative for chest pain and leg swelling.  Gastrointestinal: Negative.  Negative for abdominal pain and melena.  Genitourinary: Negative.  Negative for dysuria.  Musculoskeletal: Negative.  Negative for back pain.  Skin: Negative.  Negative for rash.  Neurological: Positive for headaches. Negative for dizziness, focal weakness and weakness.  Psychiatric/Behavioral: The patient is nervous/anxious.     As per HPI. Otherwise, a complete review of systems is negative.  PAST MEDICAL HISTORY: Past Medical History:  Diagnosis Date  . Anemia    h/o with pregnancy  . Anxiety   . Asthma    allergy induced-no inhaler  . Cancer  ([place])   . Complication of anesthesia   . Environmental allergies   . Family history of adverse reaction to anesthesia    son-breathing problems-coded 1st time when he was 2 and had another surgery at 43 and had to be admitted for breathing problems  . Family history of breast cancer   . GERD (gastroesophageal reflux disease)   . Headache    migraines  . Hypertension   . Mitral valve disease   . Mitral valve prolapse   . Painful menstrual periods   . PONV (postoperative nausea and vomiting)   . Vertigo     PAST SURGICAL HISTORY: Past Surgical History:  Procedure Laterality Date  . ABDOMINAL HYSTERECTOMY  2010   supracervical   . BUNIONECTOMY Bilateral   . MANDIBLE RECONSTRUCTION     age 25-underbite  . PORTACATH PLACEMENT Right 12/31/2017   Procedure: INSERTION PORT-A-CATH;  Surgeon: Robert Bellow, MD;  Location: [facility] ORS;  Service: General;  Laterality: Right;  . SHOULDER ARTHROSCOPY WITH OPEN ROTATOR CUFF REPAIR Right 04/26/2017   Procedure: SHOULDER ARTHROSCOPY WITH OPEN ROTATOR CUFF REPAIR,SUBACROMINAL DECOMPRESSION;  Surgeon: Thornton Park, MD;  Location: [facility] ORS;  Service: Orthopedics;  Laterality: Right;  . TONSILLECTOMY      FAMILY HISTORY: Family History  Problem Relation Age of Onset  . Breast cancer Mother 24  . Diabetes Father   . Cancer Maternal Uncle        colon  . Breast cancer Maternal Grandmother 87    ADVANCED DIRECTIVES (Y/N):  N  HEALTH MAINTENANCE: Social History   Tobacco Use  . Smoking status: Former Smoker    Packs/day: 0.50  Years: 10.00    Pack years: 5.00    Types: Cigarettes    Last attempt to quit: 04/20/2007    Years since quitting: 10.7  . Smokeless tobacco: Never Used  . Tobacco comment: social smoker back then  Substance Use Topics  . Alcohol use: Yes    Comment: occas  . Drug use: No     Colonoscopy:  PAP:  Bone density:  Lipid panel:  Allergies  Allergen Reactions  . Hydrocodone-Acetaminophen Hives     swollen face  . Iodine Hives  . Peanut Butter Flavor Other (See Comments)    Made mouth tingle  . Shellfish Allergy Hives    Current Outpatient Medications  Medication Sig Dispense Refill  . ALPRAZolam (XANAX) 0.25 MG tablet Take 1 tablet (0.25 mg total) by mouth 2 (two) times daily as needed for anxiety. 60 tablet 0  . Ascorbic Acid (VITAMIN C PO) Take 1 tablet by mouth daily.     . Cholecalciferol (VITAMIN D) 50 MCG (2000 UT) CAPS Take 2,000 Units by mouth daily.    . diazepam (VALIUM) 5 MG tablet Take 1 tablet (5 mg total) by mouth every 6 (six) hours as needed for anxiety. 30 tablet 0  . docusate sodium (COLACE) 100 MG capsule Take 100 mg by mouth 2 (two) times daily.    Marland Kitchen EPINEPHrine 0.3 mg/0.3 mL IJ SOAJ injection Inject 0.3 mLs (0.3 mg total) into the muscle once. 1 Device 12  . famotidine (PEPCID) 20 MG tablet   0  . fluticasone (FLONASE) 50 MCG/ACT nasal spray Place 1 spray into both nostrils daily as needed for allergies or rhinitis.    Marland Kitchen ibuprofen (ADVIL,MOTRIN) 200 MG tablet Take 400 mg by mouth daily as needed for moderate pain.    Marland Kitchen L-Methylfolate 15 MG TABS TAKE 1/2 TABLET (7.5 MG) BY MOUTH DAILY (Patient taking differently: Take 7.5 mg by mouth daily. ) 45 tablet 3  . lidocaine-prilocaine (EMLA) cream Apply to affected area once 30 g 3  . losartan (COZAAR) 50 MG tablet TAKE 1 TABLET BY MOUTH DAILY 90 tablet 3  . montelukast (SINGULAIR) 10 MG tablet Take 1 tablet (10 mg total) by mouth at bedtime. 30 tablet 12  . ondansetron (ZOFRAN) 8 MG tablet Take 1 tablet (8 mg total) by mouth 2 (two) times daily as needed. 30 tablet 2  . Probiotic CAPS Take 1 capsule by mouth at bedtime.    . prochlorperazine (COMPAZINE) 10 MG tablet Take 1 tablet (10 mg total) by mouth every 6 (six) hours as needed (Nausea or vomiting). 60 tablet 2  . tretinoin (RETIN-A) 0.1 % cream Apply 1 application topically at bedtime.     No current facility-administered medications for this visit.      OBJECTIVE: Vitals:   01/31/18 0849  BP: 129/86  Pulse: 91  Temp: (!) 96.8 F (36 C)     Body mass index is 22.76 kg/m.    ECOG FS:0 - Asymptomatic  General: Well-developed, well-nourished, no acute distress. Eyes: Pink conjunctiva, anicteric sclera. HEENT: Normocephalic, moist mucous membranes. Breast: Easily palpable left breast mass.  Exam deferred today. Lungs: Clear to auscultation bilaterally. Heart: Regular rate and rhythm. No rubs, murmurs, or gallops. Abdomen: Soft, nontender, nondistended. No organomegaly noted, normoactive bowel sounds. Musculoskeletal: No edema, cyanosis, or clubbing. Neuro: Alert, answering all questions appropriately. Cranial nerves grossly intact. Skin: No rashes or petechiae noted. Psych: Normal affect.  LAB RESULTS:  Lab Results  Component Value Date   NA 138 01/31/2018  K 4.0 01/31/2018   CL 103 01/31/2018   CO2 27 01/31/2018   GLUCOSE 104 (H) 01/31/2018   BUN 17 01/31/2018   CREATININE 0.59 01/31/2018   CALCIUM 9.5 01/31/2018   PROT 6.8 01/31/2018   ALBUMIN 4.1 01/31/2018   AST 20 01/31/2018   ALT 14 01/31/2018   ALKPHOS 65 01/31/2018   BILITOT 0.4 01/31/2018   GFRNONAA >60 01/31/2018   GFRAA >60 01/31/2018    Lab Results  Component Value Date   WBC 12.8 (H) 01/31/2018   NEUTROABS 9.1 (H) 01/31/2018   HGB 10.5 (L) 01/31/2018   HCT 31.9 (L) 01/31/2018   MCV 89.4 01/31/2018   PLT 254 01/31/2018     STUDIES: No results found.  ASSESSMENT: Clinical stage IIIB triple negative invasive carcinoma of the lower inner quadrant of the left breast.  PLAN:    1. Clinical stage IIIB triple negative invasive carcinoma of the lower inner quadrant of the left breast: CT scan results from January 01, 2018 did not reveal any metastatic disease.  Given patient's stage of disease, she will benefit from neoadjuvant chemotherapy using Adriamycin, Cytoxan with Udenyca support followed by carboplatinum and Taxol.  Patient has indicated  she may prefer to undergo mastectomy, therefore adjuvant XRT may not be needed.  An aromatase inhibitor would not offer any benefit given the triple negative status of her disease.  MUGA scan on January 01, 2018 revealed an EF of 71%.  Proceed with cycle 3 of Adriamycin and Cytoxan today.  Return tomorrow for Udenyca and then in 2 weeks for further evaluation and consideration of cycle 4.   2.  Anxiety: Chronic.  Continue Xanax as needed. 3.  Anemia: Hemoglobin decreased, but stable.  Monitor. 4.  Thrombocytopenia: Resolved. 5.  Congestion/sore throat: Continue OTC treatments as indicated. 6.  Headache: Continue ibuprofen sparingly as needed.  Patient expressed understanding and was in agreement with this plan. She also understands that She can call clinic at any time with any questions, concerns, or complaints.   Cancer Staging Primary cancer of lower-inner quadrant of left female breast [facility]) Staging form: Breast, AJCC 8th Edition - Clinical stage from 12/24/2017: Stage IIIB (cT2, cN1, cM0, G3, ER-, PR-, HER2-) - Signed by Lloyd Huger, MD on 12/24/2017 - Pathologic: No stage assigned - Unsigned   Lloyd Huger, MD   02/01/2018 6:44 AM

## 2018-01-29 ENCOUNTER — Telehealth: Payer: Self-pay | Admitting: *Deleted

## 2018-01-29 NOTE — Telephone Encounter (Signed)
Call returned to patient and advised if she begins to run fever to go to Urgent care otherwise she will be seen Thursday as planned

## 2018-01-29 NOTE — Telephone Encounter (Signed)
Patient lled reporting that she has chemotherapy appointment Thursday, but she has a scratchy throat from nasal drainage, she has 2 small mouth ulcers. She denies fever and that she has a swollen lymph node on her left neck. Asking if she just needs to wait until Thursday or if something should be done now.Please advise

## 2018-01-29 NOTE — Telephone Encounter (Signed)
She can try to wait until Thursday or urgent care.  If fever then urgent care.

## 2018-01-31 ENCOUNTER — Inpatient Hospital Stay (HOSPITAL_BASED_OUTPATIENT_CLINIC_OR_DEPARTMENT_OTHER): Payer: No Typology Code available for payment source | Admitting: Oncology

## 2018-01-31 ENCOUNTER — Encounter: Payer: Self-pay | Admitting: *Deleted

## 2018-01-31 ENCOUNTER — Other Ambulatory Visit: Payer: Self-pay

## 2018-01-31 ENCOUNTER — Inpatient Hospital Stay: Payer: No Typology Code available for payment source

## 2018-01-31 ENCOUNTER — Inpatient Hospital Stay: Payer: No Typology Code available for payment source | Attending: Oncology

## 2018-01-31 VITALS — BP 129/86 | HR 91 | Temp 96.8°F | Ht 66.0 in | Wt 141.0 lb

## 2018-01-31 DIAGNOSIS — R0981 Nasal congestion: Secondary | ICD-10-CM

## 2018-01-31 DIAGNOSIS — Z5189 Encounter for other specified aftercare: Secondary | ICD-10-CM | POA: Diagnosis not present

## 2018-01-31 DIAGNOSIS — Z171 Estrogen receptor negative status [ER-]: Secondary | ICD-10-CM | POA: Diagnosis not present

## 2018-01-31 DIAGNOSIS — R238 Other skin changes: Secondary | ICD-10-CM | POA: Insufficient documentation

## 2018-01-31 DIAGNOSIS — C50312 Malignant neoplasm of lower-inner quadrant of left female breast: Secondary | ICD-10-CM

## 2018-01-31 DIAGNOSIS — D649 Anemia, unspecified: Secondary | ICD-10-CM | POA: Insufficient documentation

## 2018-01-31 DIAGNOSIS — F419 Anxiety disorder, unspecified: Secondary | ICD-10-CM | POA: Insufficient documentation

## 2018-01-31 DIAGNOSIS — R070 Pain in throat: Secondary | ICD-10-CM

## 2018-01-31 DIAGNOSIS — R51 Headache: Secondary | ICD-10-CM

## 2018-01-31 DIAGNOSIS — R102 Pelvic and perineal pain: Secondary | ICD-10-CM | POA: Diagnosis not present

## 2018-01-31 DIAGNOSIS — R519 Headache, unspecified: Secondary | ICD-10-CM

## 2018-01-31 DIAGNOSIS — Z5111 Encounter for antineoplastic chemotherapy: Secondary | ICD-10-CM | POA: Diagnosis present

## 2018-01-31 DIAGNOSIS — Z87891 Personal history of nicotine dependence: Secondary | ICD-10-CM

## 2018-01-31 LAB — CBC WITH DIFFERENTIAL/PLATELET
Abs Immature Granulocytes: 1.26 10*3/uL — ABNORMAL HIGH (ref 0.00–0.07)
Basophils Absolute: 0.1 10*3/uL (ref 0.0–0.1)
Basophils Relative: 1 %
Eosinophils Absolute: 0 10*3/uL (ref 0.0–0.5)
Eosinophils Relative: 0 %
HCT: 31.9 % — ABNORMAL LOW (ref 36.0–46.0)
HEMOGLOBIN: 10.5 g/dL — AB (ref 12.0–15.0)
Immature Granulocytes: 10 %
Lymphocytes Relative: 11 %
Lymphs Abs: 1.5 10*3/uL (ref 0.7–4.0)
MCH: 29.4 pg (ref 26.0–34.0)
MCHC: 32.9 g/dL (ref 30.0–36.0)
MCV: 89.4 fL (ref 80.0–100.0)
MONO ABS: 0.9 10*3/uL (ref 0.1–1.0)
Monocytes Relative: 7 %
Neutro Abs: 9.1 10*3/uL — ABNORMAL HIGH (ref 1.7–7.7)
Neutrophils Relative %: 71 %
Platelets: 254 10*3/uL (ref 150–400)
RBC: 3.57 MIL/uL — ABNORMAL LOW (ref 3.87–5.11)
RDW: 12.2 % (ref 11.5–15.5)
WBC: 12.8 10*3/uL — ABNORMAL HIGH (ref 4.0–10.5)
nRBC: 0 % (ref 0.0–0.2)

## 2018-01-31 LAB — COMPREHENSIVE METABOLIC PANEL
ALT: 14 U/L (ref 0–44)
AST: 20 U/L (ref 15–41)
Albumin: 4.1 g/dL (ref 3.5–5.0)
Alkaline Phosphatase: 65 U/L (ref 38–126)
Anion gap: 8 (ref 5–15)
BUN: 17 mg/dL (ref 6–20)
CO2: 27 mmol/L (ref 22–32)
Calcium: 9.5 mg/dL (ref 8.9–10.3)
Chloride: 103 mmol/L (ref 98–111)
Creatinine, Ser: 0.59 mg/dL (ref 0.44–1.00)
GFR calc Af Amer: >60 mL/min (ref >60)
GFR calc non Af Amer: >60 mL/min (ref >60)
GLUCOSE: 104 mg/dL — AB (ref 70–99)
Potassium: 4 mmol/L (ref 3.5–5.1)
Sodium: 138 mmol/L (ref 135–145)
Total Bilirubin: 0.4 mg/dL (ref 0.3–1.2)
Total Protein: 6.8 g/dL (ref 6.5–8.1)

## 2018-01-31 MED ORDER — HEPARIN SOD (PORK) LOCK FLUSH 100 UNIT/ML IV SOLN
500.0000 [IU] | Freq: Once | INTRAVENOUS | Status: AC
  Administered 2018-01-31: 500 [IU] via INTRAVENOUS
  Filled 2018-01-31: qty 5

## 2018-01-31 MED ORDER — SODIUM CHLORIDE 0.9 % IV SOLN
Freq: Once | INTRAVENOUS | Status: AC
  Administered 2018-01-31: 10:00:00 via INTRAVENOUS
  Filled 2018-01-31: qty 250

## 2018-01-31 MED ORDER — SODIUM CHLORIDE 0.9 % IV SOLN
1000.0000 mg | Freq: Once | INTRAVENOUS | Status: AC
  Administered 2018-01-31: 1000 mg via INTRAVENOUS
  Filled 2018-01-31: qty 50

## 2018-01-31 MED ORDER — FAMOTIDINE IN NACL 20-0.9 MG/50ML-% IV SOLN
20.0000 mg | Freq: Once | INTRAVENOUS | Status: AC
  Administered 2018-01-31: 20 mg via INTRAVENOUS
  Filled 2018-01-31: qty 50

## 2018-01-31 MED ORDER — IBUPROFEN 200 MG PO TABS
200.0000 mg | ORAL_TABLET | Freq: Once | ORAL | Status: AC
  Administered 2018-01-31: 200 mg via ORAL
  Filled 2018-01-31: qty 1

## 2018-01-31 MED ORDER — DOXORUBICIN HCL CHEMO IV INJECTION 2 MG/ML
60.0000 mg/m2 | Freq: Once | INTRAVENOUS | Status: AC
  Administered 2018-01-31: 104 mg via INTRAVENOUS
  Filled 2018-01-31: qty 50

## 2018-01-31 MED ORDER — PALONOSETRON HCL INJECTION 0.25 MG/5ML
0.2500 mg | Freq: Once | INTRAVENOUS | Status: AC
  Administered 2018-01-31: 0.25 mg via INTRAVENOUS
  Filled 2018-01-31: qty 5

## 2018-01-31 MED ORDER — SODIUM CHLORIDE 0.9% FLUSH
10.0000 mL | INTRAVENOUS | Status: DC | PRN
  Administered 2018-01-31: 10 mL via INTRAVENOUS
  Filled 2018-01-31: qty 10

## 2018-01-31 MED ORDER — SODIUM CHLORIDE 0.9 % IV SOLN
Freq: Once | INTRAVENOUS | Status: AC
  Administered 2018-01-31: 10:00:00 via INTRAVENOUS
  Filled 2018-01-31: qty 5

## 2018-01-31 NOTE — Progress Notes (Signed)
0955 - patient complains of slight headache, requests ibuprofen, Dr. Grayland Ormond ordered 200 mg of ibuprofen.  10:25 - recheck of pain and patient states headache slightly better, will let me know if she needs an additional ibuprofen.  11:15 - patient requests additional ibuprofen for headache, Dr. Grayland Ormond ordered additional 200 mg of ibuprofen.  11:40 - headache better, but patient complains of worsening "lump in throat, indigestion", typically occurs during treatment per patient.  Dr. Grayland Ormond ordered Pepcid IV for patient.    12:07 - patient states Pepcid relieved indigestion almost immediately.

## 2018-01-31 NOTE — Progress Notes (Signed)
Patient is here today to follow up on her left breast cancer. Patient stated that yesterday she was not feeling well. Patient stated that she started to have a left ear ache, sinus pressure and headache and sinus drainage. Patient denied fever, chills, nausea, vomiting, diarrhea and constipation. Patient wanted to know if she could get something for her headache today.

## 2018-02-01 ENCOUNTER — Telehealth: Payer: Self-pay | Admitting: *Deleted

## 2018-02-01 ENCOUNTER — Encounter: Payer: Self-pay | Admitting: *Deleted

## 2018-02-01 ENCOUNTER — Inpatient Hospital Stay: Payer: No Typology Code available for payment source

## 2018-02-01 ENCOUNTER — Other Ambulatory Visit: Payer: Self-pay | Admitting: *Deleted

## 2018-02-01 DIAGNOSIS — C50312 Malignant neoplasm of lower-inner quadrant of left female breast: Secondary | ICD-10-CM

## 2018-02-01 DIAGNOSIS — Z5111 Encounter for antineoplastic chemotherapy: Secondary | ICD-10-CM | POA: Diagnosis not present

## 2018-02-01 MED ORDER — PEGFILGRASTIM-CBQV 6 MG/0.6ML ~~LOC~~ SOSY
6.0000 mg | PREFILLED_SYRINGE | Freq: Once | SUBCUTANEOUS | Status: AC
  Administered 2018-02-01: 6 mg via SUBCUTANEOUS

## 2018-02-01 NOTE — Telephone Encounter (Signed)
Left vm for patient regarding letter that has been sent to [facility]. Letter has been faxed to number indicated on form, [phone]. Letter sent at patients request via email.

## 2018-02-09 NOTE — Progress Notes (Signed)
[place]  [phone]) [phone] Fax:(336) [phone]  ID: Carolan Clines OB: 10/01/1970  MR#: [id number]  HAL#:[id number]  Patient Care Team: Jerrol Banana., MD as PCP - General (Family Medicine) [name], MD as Consulting Physician (Cardiology)  CHIEF COMPLAINT: Clinical stage IIIB triple negative invasive carcinoma of the lower inner quadrant of the left breast.  INTERVAL HISTORY: Patient returns to clinic today for further evaluation and consideration of cycle 4 of Adriamycin and Cytoxan.  She noted slight increased nausea after her most recent treatment, but this has resolved and she feels well.  She has no neurologic complaints.  She has a good appetite and denies weight loss.  She has no chest pain or shortness of breath.  She denies any nausea, vomiting, constipation, or diarrhea.  She has no urinary complaints.  Patient offers no further specific complaints today.  REVIEW OF SYSTEMS:   Review of Systems  Constitutional: Negative.  Negative for fever, malaise/fatigue and weight loss.  HENT: Negative.  Negative for congestion, ear pain and sore throat.   Respiratory: Negative.  Negative for cough, hemoptysis and shortness of breath.   Cardiovascular: Negative.  Negative for chest pain and leg swelling.  Gastrointestinal: Negative.  Negative for abdominal pain and melena.  Genitourinary: Negative.  Negative for dysuria.  Musculoskeletal: Negative.  Negative for back pain.  Skin: Negative.  Negative for rash.  Neurological: Negative.  Negative for dizziness, focal weakness, weakness and headaches.  Psychiatric/Behavioral: The patient is nervous/anxious.     As per HPI. Otherwise, a complete review of systems is negative.  PAST MEDICAL HISTORY: Past Medical History:  Diagnosis Date  . Anemia    h/o with pregnancy  . Anxiety   . Asthma    allergy induced-no inhaler  . Cancer ([place])   . Complication of anesthesia   . Environmental  allergies   . Family history of adverse reaction to anesthesia    son-breathing problems-coded 1st time when he was 2 and had another surgery at 86 and had to be admitted for breathing problems  . Family history of breast cancer   . GERD (gastroesophageal reflux disease)   . Headache    migraines  . Hypertension   . Mitral valve disease   . Mitral valve prolapse   . Painful menstrual periods   . PONV (postoperative nausea and vomiting)   . Vertigo     PAST SURGICAL HISTORY: Past Surgical History:  Procedure Laterality Date  . ABDOMINAL HYSTERECTOMY  2010   supracervical   . BUNIONECTOMY Bilateral   . MANDIBLE RECONSTRUCTION     age 15-underbite  . PORTACATH PLACEMENT Right 12/31/2017   Procedure: INSERTION PORT-A-CATH;  Surgeon: Robert Bellow, MD;  Location: [facility] ORS;  Service: General;  Laterality: Right;  . SHOULDER ARTHROSCOPY WITH OPEN ROTATOR CUFF REPAIR Right 04/26/2017   Procedure: SHOULDER ARTHROSCOPY WITH OPEN ROTATOR CUFF REPAIR,SUBACROMINAL DECOMPRESSION;  Surgeon: Thornton Park, MD;  Location: [facility] ORS;  Service: Orthopedics;  Laterality: Right;  . TONSILLECTOMY      FAMILY HISTORY: Family History  Problem Relation Age of Onset  . Breast cancer Mother 80  . Diabetes Father   . Cancer Maternal Uncle        colon  . Breast cancer Maternal Grandmother 27    ADVANCED DIRECTIVES (Y/N):  N  HEALTH MAINTENANCE: Social History   Tobacco Use  . Smoking status: Former Smoker    Packs/day: 0.50    Years: 10.00    Pack  years: 5.00    Types: Cigarettes    Last attempt to quit: 04/20/2007    Years since quitting: 10.8  . Smokeless tobacco: Never Used  . Tobacco comment: social smoker back then  Substance Use Topics  . Alcohol use: Yes    Comment: occas  . Drug use: No     Colonoscopy:  PAP:  Bone density:  Lipid panel:  Allergies  Allergen Reactions  . Hydrocodone-Acetaminophen Hives    swollen face  . Iodine Hives  . Peanut Butter Flavor  Other (See Comments)    Made mouth tingle  . Shellfish Allergy Hives    Current Outpatient Medications  Medication Sig Dispense Refill  . ALPRAZolam (XANAX) 0.25 MG tablet Take 1 tablet (0.25 mg total) by mouth 2 (two) times daily as needed for anxiety. 60 tablet 0  . Ascorbic Acid (VITAMIN C PO) Take 1 tablet by mouth daily.     . Cholecalciferol (VITAMIN D) 50 MCG (2000 UT) CAPS Take 2,000 Units by mouth daily.    Marland Kitchen docusate sodium (COLACE) 100 MG capsule Take 100 mg by mouth 2 (two) times daily.    Marland Kitchen EPINEPHrine 0.3 mg/0.3 mL IJ SOAJ injection Inject 0.3 mLs (0.3 mg total) into the muscle once. 1 Device 12  . famotidine (PEPCID) 20 MG tablet   0  . fluticasone (FLONASE) 50 MCG/ACT nasal spray Place 1 spray into both nostrils daily as needed for allergies or rhinitis.    Marland Kitchen ibuprofen (ADVIL,MOTRIN) 200 MG tablet Take 400 mg by mouth daily as needed for moderate pain.    Marland Kitchen L-Methylfolate 15 MG TABS TAKE 1/2 TABLET (7.5 MG) BY MOUTH DAILY (Patient taking differently: Take 7.5 mg by mouth daily. ) 45 tablet 3  . lidocaine-prilocaine (EMLA) cream Apply to affected area once 30 g 3  . losartan (COZAAR) 50 MG tablet TAKE 1 TABLET BY MOUTH DAILY 90 tablet 3  . montelukast (SINGULAIR) 10 MG tablet Take 1 tablet (10 mg total) by mouth at bedtime. 30 tablet 12  . ondansetron (ZOFRAN) 8 MG tablet Take 1 tablet (8 mg total) by mouth 2 (two) times daily as needed. 30 tablet 2  . Probiotic CAPS Take 1 capsule by mouth at bedtime.    . prochlorperazine (COMPAZINE) 10 MG tablet Take 1 tablet (10 mg total) by mouth every 6 (six) hours as needed (Nausea or vomiting). 60 tablet 2  . tretinoin (RETIN-A) 0.1 % cream Apply 1 application topically at bedtime.     No current facility-administered medications for this visit.     OBJECTIVE: Vitals:   02/14/18 0913  BP: 124/83  Pulse: 93  Temp: (!) 97.1 F (36.2 C)     Body mass index is 22.34 kg/m.    ECOG FS:0 - Asymptomatic  General: Well-developed,  well-nourished, no acute distress. Eyes: Pink conjunctiva, anicteric sclera. HEENT: Normocephalic, moist mucous membranes. Breast: Easily palpable left breast mass.  Exam deferred today. Lungs: Clear to auscultation bilaterally. Heart: Regular rate and rhythm. No rubs, murmurs, or gallops. Abdomen: Soft, nontender, nondistended. No organomegaly noted, normoactive bowel sounds. Musculoskeletal: No edema, cyanosis, or clubbing. Neuro: Alert, answering all questions appropriately. Cranial nerves grossly intact. Skin: No rashes or petechiae noted. Psych: Normal affect.  LAB RESULTS:  Lab Results  Component Value Date   NA 140 02/14/2018   K 3.9 02/14/2018   CL 102 02/14/2018   CO2 27 02/14/2018   GLUCOSE 113 (H) 02/14/2018   BUN 17 02/14/2018   CREATININE 0.64 02/14/2018  CALCIUM 9.8 02/14/2018   PROT 7.3 02/14/2018   ALBUMIN 4.3 02/14/2018   AST 24 02/14/2018   ALT 16 02/14/2018   ALKPHOS 74 02/14/2018   BILITOT 0.4 02/14/2018   GFRNONAA >60 02/14/2018   GFRAA >60 02/14/2018    Lab Results  Component Value Date   WBC 14.2 (H) 02/14/2018   NEUTROABS 10.0 (H) 02/14/2018   HGB 10.2 (L) 02/14/2018   HCT 30.7 (L) 02/14/2018   MCV 89.8 02/14/2018   PLT 262 02/14/2018     STUDIES: No results found.  ASSESSMENT: Clinical stage IIIB triple negative invasive carcinoma of the lower inner quadrant of the left breast.  PLAN:    1. Clinical stage IIIB triple negative invasive carcinoma of the lower inner quadrant of the left breast: CT scan results from January 01, 2018 did not reveal any metastatic disease.  Given patient's stage of disease, she will benefit from neoadjuvant chemotherapy using Adriamycin, Cytoxan with Udenyca support followed by carboplatinum and Taxol.  Patient has indicated she may prefer to undergo mastectomy, therefore adjuvant XRT may not be needed.  An aromatase inhibitor would not offer any benefit given the triple negative status of her disease.  Also  discussed today with the possibility of using capecitabine adjuvantly if patient does not have a complete pathologic response.  MUGA scan on January 01, 2018 revealed an EF of 71%.  Proceed with cycle 4 of Adriamycin and Cytoxan today.  Return to clinic tomorrow for [facility] and then in 2 weeks for further evaluation and consideration of cycle 1 of carboplatinum and Taxol.     2.  Anxiety: Chronic.  Continue Xanax as needed. 3.  Anemia: Patient's hemoglobin is trending down is now 10.2, monitor.   4.  Headache: Patient does not complain of this today.  Continue ibuprofen sparingly as needed.  Patient expressed understanding and was in agreement with this plan. She also understands that She can call clinic at any time with any questions, concerns, or complaints.   Cancer Staging Primary cancer of lower-inner quadrant of left female breast [facility]) Staging form: Breast, AJCC 8th Edition - Clinical stage from 12/24/2017: Stage IIIB (cT2, cN1, cM0, G3, ER-, PR-, HER2-) - Signed by Lloyd Huger, MD on 12/24/2017 - Pathologic: No stage assigned - Unsigned   Lloyd Huger, MD   02/15/2018 6:30 AM

## 2018-02-14 ENCOUNTER — Inpatient Hospital Stay: Payer: No Typology Code available for payment source

## 2018-02-14 ENCOUNTER — Other Ambulatory Visit: Payer: Self-pay

## 2018-02-14 ENCOUNTER — Inpatient Hospital Stay (HOSPITAL_BASED_OUTPATIENT_CLINIC_OR_DEPARTMENT_OTHER): Payer: No Typology Code available for payment source | Admitting: Oncology

## 2018-02-14 VITALS — BP 124/83 | HR 93 | Temp 97.1°F | Ht 66.0 in | Wt 138.4 lb

## 2018-02-14 DIAGNOSIS — Z171 Estrogen receptor negative status [ER-]: Secondary | ICD-10-CM | POA: Diagnosis not present

## 2018-02-14 DIAGNOSIS — Z87891 Personal history of nicotine dependence: Secondary | ICD-10-CM

## 2018-02-14 DIAGNOSIS — Z5111 Encounter for antineoplastic chemotherapy: Secondary | ICD-10-CM | POA: Diagnosis not present

## 2018-02-14 DIAGNOSIS — D649 Anemia, unspecified: Secondary | ICD-10-CM | POA: Diagnosis not present

## 2018-02-14 DIAGNOSIS — C50312 Malignant neoplasm of lower-inner quadrant of left female breast: Secondary | ICD-10-CM

## 2018-02-14 DIAGNOSIS — F419 Anxiety disorder, unspecified: Secondary | ICD-10-CM | POA: Diagnosis not present

## 2018-02-14 LAB — COMPREHENSIVE METABOLIC PANEL
ALBUMIN: 4.3 g/dL (ref 3.5–5.0)
ALT: 16 U/L (ref 0–44)
AST: 24 U/L (ref 15–41)
Alkaline Phosphatase: 74 U/L (ref 38–126)
Anion gap: 11 (ref 5–15)
BILIRUBIN TOTAL: 0.4 mg/dL (ref 0.3–1.2)
BUN: 17 mg/dL (ref 6–20)
CO2: 27 mmol/L (ref 22–32)
Calcium: 9.8 mg/dL (ref 8.9–10.3)
Chloride: 102 mmol/L (ref 98–111)
Creatinine, Ser: 0.64 mg/dL (ref 0.44–1.00)
GFR calc Af Amer: >60 mL/min (ref >60)
GFR calc non Af Amer: >60 mL/min (ref >60)
GLUCOSE: 113 mg/dL — AB (ref 70–99)
Potassium: 3.9 mmol/L (ref 3.5–5.1)
Sodium: 140 mmol/L (ref 135–145)
TOTAL PROTEIN: 7.3 g/dL (ref 6.5–8.1)

## 2018-02-14 LAB — CBC WITH DIFFERENTIAL/PLATELET
Abs Immature Granulocytes: 1.63 10*3/uL — ABNORMAL HIGH (ref 0.00–0.07)
Basophils Absolute: 0.2 10*3/uL — ABNORMAL HIGH (ref 0.0–0.1)
Basophils Relative: 1 %
Eosinophils Absolute: 0.1 10*3/uL (ref 0.0–0.5)
Eosinophils Relative: 1 %
HCT: 30.7 % — ABNORMAL LOW (ref 36.0–46.0)
Hemoglobin: 10.2 g/dL — ABNORMAL LOW (ref 12.0–15.0)
Immature Granulocytes: 12 %
Lymphocytes Relative: 10 %
Lymphs Abs: 1.5 10*3/uL (ref 0.7–4.0)
MCH: 29.8 pg (ref 26.0–34.0)
MCHC: 33.2 g/dL (ref 30.0–36.0)
MCV: 89.8 fL (ref 80.0–100.0)
Monocytes Absolute: 0.9 10*3/uL (ref 0.1–1.0)
Monocytes Relative: 6 %
Neutro Abs: 10 10*3/uL — ABNORMAL HIGH (ref 1.7–7.7)
Neutrophils Relative %: 70 %
Platelets: 262 10*3/uL (ref 150–400)
RBC: 3.42 MIL/uL — ABNORMAL LOW (ref 3.87–5.11)
RDW: 13.7 % (ref 11.5–15.5)
WBC: 14.2 10*3/uL — ABNORMAL HIGH (ref 4.0–10.5)
nRBC: 0 % (ref 0.0–0.2)

## 2018-02-14 MED ORDER — PALONOSETRON HCL INJECTION 0.25 MG/5ML
0.2500 mg | Freq: Once | INTRAVENOUS | Status: AC
  Administered 2018-02-14: 0.25 mg via INTRAVENOUS
  Filled 2018-02-14: qty 5

## 2018-02-14 MED ORDER — SODIUM CHLORIDE 0.9 % IV SOLN
Freq: Once | INTRAVENOUS | Status: AC
  Administered 2018-02-14: 11:00:00 via INTRAVENOUS
  Filled 2018-02-14: qty 5

## 2018-02-14 MED ORDER — SODIUM CHLORIDE 0.9 % IV SOLN
1000.0000 mg | Freq: Once | INTRAVENOUS | Status: AC
  Administered 2018-02-14: 1000 mg via INTRAVENOUS
  Filled 2018-02-14: qty 50

## 2018-02-14 MED ORDER — HEPARIN SOD (PORK) LOCK FLUSH 100 UNIT/ML IV SOLN
INTRAVENOUS | Status: AC
  Filled 2018-02-14: qty 5

## 2018-02-14 MED ORDER — HEPARIN SOD (PORK) LOCK FLUSH 100 UNIT/ML IV SOLN
500.0000 [IU] | Freq: Once | INTRAVENOUS | Status: AC
  Administered 2018-02-14: 500 [IU] via INTRAVENOUS
  Filled 2018-02-14: qty 5

## 2018-02-14 MED ORDER — SODIUM CHLORIDE 0.9 % IV SOLN
Freq: Once | INTRAVENOUS | Status: AC
  Administered 2018-02-14: 11:00:00 via INTRAVENOUS
  Filled 2018-02-14: qty 250

## 2018-02-14 MED ORDER — DOXORUBICIN HCL CHEMO IV INJECTION 2 MG/ML
60.0000 mg/m2 | Freq: Once | INTRAVENOUS | Status: AC
  Administered 2018-02-14: 104 mg via INTRAVENOUS
  Filled 2018-02-14: qty 52

## 2018-02-14 MED ORDER — SODIUM CHLORIDE 0.9% FLUSH
10.0000 mL | Freq: Once | INTRAVENOUS | Status: AC
  Administered 2018-02-14: 10 mL via INTRAVENOUS
  Filled 2018-02-14: qty 10

## 2018-02-14 MED ORDER — FAMOTIDINE IN NACL 20-0.9 MG/50ML-% IV SOLN
20.0000 mg | Freq: Once | INTRAVENOUS | Status: AC
  Administered 2018-02-14: 20 mg via INTRAVENOUS
  Filled 2018-02-14: qty 50

## 2018-02-14 NOTE — Progress Notes (Signed)
Patient is here today to follow up on her Primary cancer of lower-inner quadrant of left female breast. Patient stated that she had been doing well. However, she stated that on Fridays and Saturdays she has no appetite. She stated that she tries to dring Ensures but they just don't taste good. Patient had lost 3 lbs since her last visit 01/31/2018.

## 2018-02-15 ENCOUNTER — Inpatient Hospital Stay: Payer: No Typology Code available for payment source

## 2018-02-15 DIAGNOSIS — Z5111 Encounter for antineoplastic chemotherapy: Secondary | ICD-10-CM | POA: Diagnosis not present

## 2018-02-15 DIAGNOSIS — C50312 Malignant neoplasm of lower-inner quadrant of left female breast: Secondary | ICD-10-CM

## 2018-02-15 MED ORDER — PEGFILGRASTIM-CBQV 6 MG/0.6ML ~~LOC~~ SOSY
6.0000 mg | PREFILLED_SYRINGE | Freq: Once | SUBCUTANEOUS | Status: AC
  Administered 2018-02-15: 6 mg via SUBCUTANEOUS

## 2018-02-24 NOTE — Progress Notes (Signed)
Shelby [name]  [phone] [phone]) [phone]  ID: Carolan Clines OB: [birth year]  MR#: [id number]  AOZ#:[id number]  Patient Care Team: Jerrol Banana., MD as PCP - General (Family Medicine) [name], MD as Consulting Physician (Cardiology)  CHIEF COMPLAINT: Clinical stage IIIB triple negative invasive carcinoma of the lower inner quadrant of the left breast.  INTERVAL HISTORY: Patient returns to clinic today for further evaluation and consideration of cycle 1, day 1 of carboplatinum and Taxol.  She currently feels well and is asymptomatic.  Her vulvar pain has resolved.  She has no neurologic complaints.  She has a good appetite and denies weight loss.  She has no chest pain or shortness of breath.  She denies any nausea, vomiting, constipation, or diarrhea.  She has no urinary complaints.  Patient offers no specific complaints today.  REVIEW OF SYSTEMS:   Review of Systems  Constitutional: Negative.  Negative for fever, malaise/fatigue and weight loss.  HENT: Negative.  Negative for congestion, ear pain and sore throat.   Respiratory: Negative.  Negative for cough, hemoptysis and shortness of breath.   Cardiovascular: Negative.  Negative for chest pain and leg swelling.  Gastrointestinal: Negative.  Negative for abdominal pain and melena.  Genitourinary: Negative.  Negative for dysuria.  Musculoskeletal: Negative.  Negative for back pain.  Skin: Negative.  Negative for rash.  Neurological: Negative.  Negative for dizziness, focal weakness, weakness and headaches.  Psychiatric/Behavioral: The patient is nervous/anxious.     As per HPI. Otherwise, a complete review of systems is negative.  PAST MEDICAL HISTORY: Past Medical History:  Diagnosis Date  . Anemia    h/o with pregnancy  . Anxiety   . Asthma    allergy induced-no inhaler  . Cancer ([place])   . Complication of anesthesia   . Environmental allergies   . Family history of  adverse reaction to anesthesia    son-breathing problems-coded 1st time when he was 2 and had another surgery at 3 and had to be admitted for breathing problems  . Family history of breast cancer   . GERD (gastroesophageal reflux disease)   . Headache    migraines  . Hypertension   . Mitral valve disease   . Mitral valve prolapse   . Painful menstrual periods   . PONV (postoperative nausea and vomiting)   . Vertigo     PAST SURGICAL HISTORY: Past Surgical History:  Procedure Laterality Date  . ABDOMINAL HYSTERECTOMY  2010   supracervical   . BUNIONECTOMY Bilateral   . MANDIBLE RECONSTRUCTION     age 23-underbite  . PORTACATH PLACEMENT Right 12/31/2017   Procedure: INSERTION PORT-A-CATH;  Surgeon: Robert Bellow, MD;  Location: [facility] ORS;  Service: General;  Laterality: Right;  . SHOULDER ARTHROSCOPY WITH OPEN ROTATOR CUFF REPAIR Right 04/26/2017   Procedure: SHOULDER ARTHROSCOPY WITH OPEN ROTATOR CUFF REPAIR,SUBACROMINAL DECOMPRESSION;  Surgeon: Thornton Park, MD;  Location: [facility] ORS;  Service: Orthopedics;  Laterality: Right;  . TONSILLECTOMY      FAMILY HISTORY: Family History  Problem Relation Age of Onset  . Breast cancer Mother 53  . Diabetes Father   . Cancer Maternal Uncle        colon  . Breast cancer Maternal Grandmother 60    ADVANCED DIRECTIVES (Y/N):  N  HEALTH MAINTENANCE: Social History   Tobacco Use  . Smoking status: Former Smoker    Packs/day: 0.50    Years: 10.00    Pack years: 5.00  Types: Cigarettes    Last attempt to quit: 04/20/2007    Years since quitting: 10.8  . Smokeless tobacco: Never Used  . Tobacco comment: social smoker back then  Substance Use Topics  . Alcohol use: Yes    Comment: occas  . Drug use: No     Colonoscopy:  PAP:  Bone density:  Lipid panel:  Allergies  Allergen Reactions  . Hydrocodone-Acetaminophen Hives    swollen face  . Iodine Hives  . Peanut Butter Flavor Other (See Comments)    Made mouth  tingle  . Shellfish Allergy Hives    Current Outpatient Medications  Medication Sig Dispense Refill  . ALPRAZolam (XANAX) 0.25 MG tablet Take 1 tablet (0.25 mg total) by mouth 2 (two) times daily as needed for anxiety. 60 tablet 0  . Ascorbic Acid (VITAMIN C PO) Take 1 tablet by mouth daily.     . Cholecalciferol (VITAMIN D) 50 MCG (2000 UT) CAPS Take 2,000 Units by mouth daily.    Marland Kitchen docusate sodium (COLACE) 100 MG capsule Take 100 mg by mouth 2 (two) times daily.    Marland Kitchen EPINEPHrine 0.3 mg/0.3 mL IJ SOAJ injection Inject 0.3 mLs (0.3 mg total) into the muscle once. 1 Device 12  . famotidine (PEPCID) 20 MG tablet   0  . fluticasone (FLONASE) 50 MCG/ACT nasal spray Place 1 spray into both nostrils daily as needed for allergies or rhinitis.    Marland Kitchen ibuprofen (ADVIL,MOTRIN) 200 MG tablet Take 400 mg by mouth daily as needed for moderate pain.    Marland Kitchen L-Methylfolate 15 MG TABS TAKE 1/2 TABLET (7.5 MG) BY MOUTH DAILY 45 tablet 3  . lidocaine (XYLOCAINE) 2 % jelly Apply 1 application topically as needed. For pain 30 mL 0  . lidocaine-prilocaine (EMLA) cream Apply to affected area once 30 g 3  . losartan (COZAAR) 50 MG tablet TAKE 1 TABLET BY MOUTH DAILY 90 tablet 3  . montelukast (SINGULAIR) 10 MG tablet Take 1 tablet (10 mg total) by mouth at bedtime. 30 tablet 12  . ondansetron (ZOFRAN) 8 MG tablet Take 1 tablet (8 mg total) by mouth 2 (two) times daily as needed. 30 tablet 2  . Probiotic CAPS Take 1 capsule by mouth at bedtime.    . prochlorperazine (COMPAZINE) 10 MG tablet Take 1 tablet (10 mg total) by mouth every 6 (six) hours as needed (Nausea or vomiting). 60 tablet 2  . tretinoin (RETIN-A) 0.1 % cream Apply 1 application topically at bedtime.    . valACYclovir (VALTREX) 1000 MG tablet Take 1 tablet (1,000 mg) by mouth 2 times a day at first symptoms & continue for 10 days. For suppression, take 1 tablet by mouth daily. (Patient not taking: Reported on 02/28/2018) 60 tablet 0   No current  facility-administered medications for this visit.     OBJECTIVE: Vitals:   02/28/18 0945  BP: (!) 135/91  Pulse: (!) 104  Resp: 18  Temp: 98.5 F (36.9 C)     Body mass index is 22.6 kg/m.    ECOG FS:0 - Asymptomatic  General: Well-developed, well-nourished, no acute distress. Eyes: Pink conjunctiva, anicteric sclera. HEENT: Normocephalic, moist mucous membranes. Lungs: Clear to auscultation bilaterally. Heart: Regular rate and rhythm. No rubs, murmurs, or gallops. Abdomen: Soft, nontender, nondistended. No organomegaly noted, normoactive bowel sounds. Musculoskeletal: No edema, cyanosis, or clubbing. Neuro: Alert, answering all questions appropriately. Cranial nerves grossly intact. Skin: No rashes or petechiae noted. Psych: Normal affect.  LAB RESULTS:  Lab Results  Component Value Date   NA 140 02/14/2018   K 3.9 02/14/2018   CL 102 02/14/2018   CO2 27 02/14/2018   GLUCOSE 113 (H) 02/14/2018   BUN 17 02/14/2018   CREATININE 0.64 02/14/2018   CALCIUM 9.8 02/14/2018   PROT 7.3 02/14/2018   ALBUMIN 4.3 02/14/2018   AST 24 02/14/2018   ALT 16 02/14/2018   ALKPHOS 74 02/14/2018   BILITOT 0.4 02/14/2018   GFRNONAA >60 02/14/2018   GFRAA >60 02/14/2018    Lab Results  Component Value Date   WBC 13.4 (H) 02/28/2018   NEUTROABS PENDING 02/28/2018   HGB 9.8 (L) 02/28/2018   HCT 29.5 (L) 02/28/2018   MCV 92.2 02/28/2018   PLT 247 02/28/2018     STUDIES: No results found.  ASSESSMENT: Clinical stage IIIB triple negative invasive carcinoma of the lower inner quadrant of the left breast.  PLAN:    1. Clinical stage IIIB triple negative invasive carcinoma of the lower inner quadrant of the left breast: CT scan results from January 01, 2018 did not reveal any metastatic disease.  Given patient's stage of disease, she will benefit from neoadjuvant chemotherapy using Adriamycin, Cytoxan with Udenyca support followed by carboplatinum and Taxol.  She has now completed  Adriamycin and Cytoxan.  Patient has indicated she may prefer to undergo mastectomy, therefore adjuvant XRT may not be needed.  An aromatase inhibitor would not offer any benefit given the triple negative status of her disease.  Also discussed today with the possibility of using capecitabine adjuvantly if patient does not have a complete pathologic response.  MUGA scan on January 01, 2018 revealed an EF of 71%.  Proceed with cycle 1, day 1 of carboplatinum and Taxol today.  Return to clinic in 1 week for further evaluation and consideration of cycle 1, day 8 which is Taxol only.   2.  Anxiety: Chronic.  Continue Xanax as needed. 3.  Anemia: Patient's hemoglobin slowly is trending down is now 9.8.  Monitor. 4.  Headache: Patient does not complain of this today.  Continue ibuprofen sparingly as needed. 5.  Vulvar pain: Resolved.  Patient expressed understanding and was in agreement with this plan. She also understands that She can call clinic at any time with any questions, concerns, or complaints.   Cancer Staging Primary cancer of lower-inner quadrant of left female breast [facility]) Staging form: Breast, AJCC 8th Edition - Clinical stage from 12/24/2017: Stage IIIB (cT2, cN1, cM0, G3, ER-, PR-, HER2-) - Signed by Lloyd Huger, MD on 12/24/2017 - Pathologic: No stage assigned - Unsigned   Lloyd Huger, MD   02/28/2018 9:54 AM

## 2018-02-25 ENCOUNTER — Telehealth: Payer: Self-pay | Admitting: *Deleted

## 2018-02-25 NOTE — Telephone Encounter (Signed)
Per Dr Grayland Ormond [facility], per Alease Medina, NP see her tomorrow. Appointment accepted for 830

## 2018-02-25 NOTE — Telephone Encounter (Signed)
Patient called to report that she has a "raw area in the folds of my girl parts and it is not getting any better, I am using Vaseline on it. It does not look infected, just extremely red.". Please advise

## 2018-02-26 ENCOUNTER — Inpatient Hospital Stay (HOSPITAL_BASED_OUTPATIENT_CLINIC_OR_DEPARTMENT_OTHER): Payer: No Typology Code available for payment source | Admitting: Nurse Practitioner

## 2018-02-26 ENCOUNTER — Other Ambulatory Visit: Payer: Self-pay

## 2018-02-26 ENCOUNTER — Encounter: Payer: Self-pay | Admitting: Nurse Practitioner

## 2018-02-26 VITALS — BP 143/85 | HR 105 | Temp 97.6°F | Resp 16 | Wt 141.0 lb

## 2018-02-26 DIAGNOSIS — C50312 Malignant neoplasm of lower-inner quadrant of left female breast: Secondary | ICD-10-CM

## 2018-02-26 DIAGNOSIS — Z87891 Personal history of nicotine dependence: Secondary | ICD-10-CM

## 2018-02-26 DIAGNOSIS — R102 Pelvic and perineal pain: Secondary | ICD-10-CM

## 2018-02-26 DIAGNOSIS — R238 Other skin changes: Secondary | ICD-10-CM

## 2018-02-26 DIAGNOSIS — T451X5A Adverse effect of antineoplastic and immunosuppressive drugs, initial encounter: Secondary | ICD-10-CM | POA: Diagnosis not present

## 2018-02-26 DIAGNOSIS — R234 Changes in skin texture: Principal | ICD-10-CM

## 2018-02-26 DIAGNOSIS — Z5111 Encounter for antineoplastic chemotherapy: Secondary | ICD-10-CM | POA: Diagnosis not present

## 2018-02-26 DIAGNOSIS — R239 Unspecified skin changes: Secondary | ICD-10-CM

## 2018-02-26 MED ORDER — LIDOCAINE HCL URETHRAL/MUCOSAL 2 % EX GEL: 1.0000 "application " | CUTANEOUS | 0 refills | Status: DC | PRN

## 2018-02-26 MED ORDER — VALACYCLOVIR HCL 1 G PO TABS: ORAL_TABLET | ORAL | 0 refills | Status: DC

## 2018-02-26 MED ORDER — LIDOCAINE HCL URETHRAL/MUCOSAL 2 % EX GEL: 1.0000 "application " | CUTANEOUS | 1 refills | Status: DC | PRN

## 2018-02-26 NOTE — Patient Instructions (Signed)
Carboplatin injection What is this medicine? CARBOPLATIN (KAR boe pla tin) is a chemotherapy drug. It targets fast dividing cells, like cancer cells, and causes these cells to die. This medicine is used to treat ovarian cancer and many other cancers. This medicine may be used for other purposes; ask your health care provider or pharmacist if you have questions. COMMON BRAND NAME(S): Paraplatin What should I tell my health care provider before I take this medicine? They need to know if you have any of these conditions: -blood disorders -hearing problems -kidney disease -recent or ongoing radiation therapy -an unusual or allergic reaction to carboplatin, cisplatin, other chemotherapy, other medicines, foods, dyes, or preservatives -pregnant or trying to get pregnant -breast-feeding How should I use this medicine? This drug is usually given as an infusion into a vein. It is administered in a hospital or clinic by a specially trained health care professional. Talk to your pediatrician regarding the use of this medicine in children. Special care may be needed. Overdosage: If you think you have taken too much of this medicine contact a poison control center or emergency room at once. NOTE: This medicine is only for you. Do not share this medicine with others. What if I miss a dose? It is important not to miss a dose. Call your doctor or health care professional if you are unable to keep an appointment. What may interact with this medicine? -medicines for seizures -medicines to increase blood counts like filgrastim, pegfilgrastim, sargramostim -some antibiotics like amikacin, gentamicin, neomycin, streptomycin, tobramycin -vaccines Talk to your doctor or health care professional before taking any of these medicines: -acetaminophen -aspirin -ibuprofen -ketoprofen -naproxen This list may not describe all possible interactions. Give your health care provider a list of all the medicines, herbs,  non-prescription drugs, or dietary supplements you use. Also tell them if you smoke, drink alcohol, or use illegal drugs. Some items may interact with your medicine. What should I watch for while using this medicine? Your condition will be monitored carefully while you are receiving this medicine. You will need important blood work done while you are taking this medicine. This drug may make you feel generally unwell. This is not uncommon, as chemotherapy can affect healthy cells as well as cancer cells. Report any side effects. Continue your course of treatment even though you feel ill unless your doctor tells you to stop. In some cases, you may be given additional medicines to help with side effects. Follow all directions for their use. Call your doctor or health care professional for advice if you get a fever, chills or sore throat, or other symptoms of a cold or flu. Do not treat yourself. This drug decreases your body's ability to fight infections. Try to avoid being around people who are sick. This medicine may increase your risk to bruise or bleed. Call your doctor or health care professional if you notice any unusual bleeding. Be careful brushing and flossing your teeth or using a toothpick because you may get an infection or bleed more easily. If you have any dental work done, tell your dentist you are receiving this medicine. Avoid taking products that contain aspirin, acetaminophen, ibuprofen, naproxen, or ketoprofen unless instructed by your doctor. These medicines may hide a fever. Do not become pregnant while taking this medicine. Women should inform their doctor if they wish to become pregnant or think they might be pregnant. There is a potential for serious side effects to an unborn child. Talk to your health care professional or  pharmacist for more information. Do not breast-feed an infant while taking this medicine. What side effects may I notice from receiving this medicine? Side effects  that you should report to your doctor or health care professional as soon as possible: -allergic reactions like skin rash, itching or hives, swelling of the face, lips, or tongue -signs of infection - fever or chills, cough, sore throat, pain or difficulty passing urine -signs of decreased platelets or bleeding - bruising, pinpoint red spots on the skin, black, tarry stools, nosebleeds -signs of decreased red blood cells - unusually weak or tired, fainting spells, lightheadedness -breathing problems -changes in hearing -changes in vision -chest pain -high blood pressure -low blood counts - This drug may decrease the number of white blood cells, red blood cells and platelets. You may be at increased risk for infections and bleeding. -nausea and vomiting -pain, swelling, redness or irritation at the injection site -pain, tingling, numbness in the hands or feet -problems with balance, talking, walking -trouble passing urine or change in the amount of urine Side effects that usually do not require medical attention (report to your doctor or health care professional if they continue or are bothersome): -hair loss -loss of appetite -metallic taste in the mouth or changes in taste This list may not describe all possible side effects. Call your doctor for medical advice about side effects. You may report side effects to FDA at 1-800-FDA-1088. Where should I keep my medicine? This drug is given in a hospital or clinic and will not be stored at home. NOTE: This sheet is a summary. It may not cover all possible information. If you have questions about this medicine, talk to your doctor, pharmacist, or health care provider.  2019 Elsevier/Gold Standard (2007-04-23 14:38:05) .   

## 2018-02-26 NOTE — Progress Notes (Signed)
Symptom Management [place]  Telephone:(336) [phone] Fax:(336) [phone]  Patient Care Team: Jerrol Banana., MD as PCP - General (Family Medicine) Minna Merritts, MD as Consulting Physician (Cardiology)   Name of the [name]  [id number]  February 21, 1970   Date of visit: 02/26/18  Diagnosis- Stage IIIB triple negative invasive carcinoma of the lower inner quadrant of the left breast cancer  Chief complaint/ Reason for visit- Vulvar, Mouth, and Nasal Ulcer  Heme/Onc history:  Oncology History   Initially seen by nurse midwife Melody Gayla Medicus at gynecologist office for left tender breast mass.  Had breast ultrasound and and mammogram revealing suspicious palpable mass of left breast requiring biopsy.  Biopsy revealed grade 3 invasive ductal carcinoma.  Positive metastatic carcinoma and left axillary lymph node.  Met with Dr. Grayland Ormond on 12/24/2017 where he recommended neoadjuvant chemotherapy using Adriamycin, Cytoxan with Neulasta support followed by carbo/Taxol.  XRT may not be needed given patient would like mastectomy of left breast.   Had port placed by Dr. Bary Castilla on 12/31/2017.  CT abdomen/pelvis/chest with contrast on 01/02/2018 did not reveal metastatic disease.  Pretreatment MUGA revealed an EF of 71%.      Primary cancer of lower-inner quadrant of left female breast ([place])   12/23/2017 Initial Diagnosis    Primary cancer of lower-inner quadrant of left female breast ([place])    12/24/2017 Cancer Staging    Staging form: Breast, AJCC 8th Edition - Clinical stage from 12/24/2017: Stage IIIB (cT2, cN1, cM0, G3, ER-, PR-, HER2-) - Signed by Lloyd Huger, MD on 12/24/2017    01/03/2018 -  Chemotherapy    The patient had DOXOrubicin (ADRIAMYCIN) chemo injection 104 mg, 60 mg/m2 = 104 mg, Intravenous,  Once, 4 of 4 cycles Administration: 104 mg (01/03/2018), 104 mg (01/17/2018), 104 mg (01/31/2018), 104 mg  (02/14/2018) palonosetron (ALOXI) injection 0.25 mg, 0.25 mg, Intravenous,  Once, 4 of 8 cycles Administration: 0.25 mg (01/03/2018), 0.25 mg (01/17/2018), 0.25 mg (01/31/2018), 0.25 mg (02/14/2018) pegfilgrastim-cbqv (UDENYCA) injection 6 mg, 6 mg, Subcutaneous, Once, 4 of 4 cycles Administration: 6 mg (01/04/2018), 6 mg (01/18/2018), 6 mg (02/01/2018), 6 mg (02/15/2018) CARBOplatin (PARAPLATIN) in sodium chloride 0.9 % 100 mL chemo infusion,  (original dose ), Intravenous,  Once, 0 of 4 cycles Dose modification:   (Cycle 5) cyclophosphamide (CYTOXAN) 1,000 mg in sodium chloride 0.9 % 250 mL chemo infusion, 1,040 mg, Intravenous,  Once, 4 of 4 cycles Administration: 1,000 mg (01/03/2018), 1,000 mg (01/17/2018), 1,000 mg (01/31/2018), 1,000 mg (02/14/2018) PACLitaxel (TAXOL) 138 mg in sodium chloride 0.9 % 250 mL chemo infusion (</= 58m/m2), 80 mg/m2, Intravenous,  Once, 0 of 4 cycles fosaprepitant (EMEND) 150 mg, dexamethasone (DECADRON) 12 mg in sodium chloride 0.9 % 145 mL IVPB, , Intravenous,  Once, 4 of 8 cycles Administration:  (01/03/2018),  (01/17/2018),  (01/31/2018),  (02/14/2018)  for chemotherapy treatment.      Interval history- TLeili Eskenazi 48year old female with above history of triple negative cancer, who presents to symptom management clinic for ulceration of vulva, nose, and mouth.  Symptoms started in her nose and mouth after previous cycle of chemotherapy.  Shortly after this cycle of chemo she developed vulvar pain.  She has been applying topical OTCs and keeping area clean but has not noticed significant improvement in her pain.  Pain worse with irritation/friction and urination.  She has previous history of cold sores and shingles.  Has not had symptoms similar to this previously.  Does  not take antivirals chronically.  Says she is interested in a holistic approach if possible. Denies fever or chills.  Appetite is stable.  Denies nausea or vomiting.  Denies urinary complaints.  ECOG  FS:0 - Asymptomatic  Review of systems- Review of Systems  Constitutional: Positive for malaise/fatigue. Negative for chills and fever.  HENT: Negative.   Eyes: Negative.   Respiratory: Negative.   Cardiovascular: Negative.   Gastrointestinal: Negative.   Genitourinary: Negative.   Musculoskeletal: Negative.   Skin: Negative for itching and rash.       Per hpi  Neurological: Negative.   Psychiatric/Behavioral: Negative for depression. The patient is nervous/anxious. The patient does not have insomnia.      Current treatment- Cytoxan & Adriamycin s/p cycle 4 on 02/14/2018. Plan for carbo-taxol on 02/28/2018  Allergies  Allergen Reactions  . Hydrocodone-Acetaminophen Hives    swollen face  . Iodine Hives  . Peanut Butter Flavor Other (See Comments)    Made mouth tingle  . Shellfish Allergy Hives    Past Medical History:  Diagnosis Date  . Anemia    h/o with pregnancy  . Anxiety   . Asthma    allergy induced-no inhaler  . Cancer ([place])   . Complication of anesthesia   . Environmental allergies   . Family history of adverse reaction to anesthesia    son-breathing problems-coded 1st time when he was 2 and had another surgery at 46 and had to be admitted for breathing problems  . Family history of breast cancer   . GERD (gastroesophageal reflux disease)   . Headache    migraines  . Hypertension   . Mitral valve disease   . Mitral valve prolapse   . Painful menstrual periods   . PONV (postoperative nausea and vomiting)   . Vertigo     Past Surgical History:  Procedure Laterality Date  . ABDOMINAL HYSTERECTOMY  2010   supracervical   . BUNIONECTOMY Bilateral   . MANDIBLE RECONSTRUCTION     age 48-underbite  . PORTACATH PLACEMENT Right 12/31/2017   Procedure: INSERTION PORT-A-CATH;  Surgeon: Robert Bellow, MD;  Location: [facility] ORS;  Service: General;  Laterality: Right;  . SHOULDER ARTHROSCOPY WITH OPEN ROTATOR CUFF REPAIR Right 04/26/2017   Procedure: SHOULDER  ARTHROSCOPY WITH OPEN ROTATOR CUFF REPAIR,SUBACROMINAL DECOMPRESSION;  Surgeon: Thornton Park, MD;  Location: [facility] ORS;  Service: Orthopedics;  Laterality: Right;  . TONSILLECTOMY      Social History   Socioeconomic History  . Marital status: Divorced    Spouse name: Not on file  . Number of children: Not on file  . Years of education: Not on file  . Highest education level: Not on file  Occupational History  . Not on file  Social Needs  . Financial resource strain: Not on file  . Food insecurity:    Worry: Not on file    Inability: Not on file  . Transportation needs:    Medical: Not on file    Non-medical: Not on file  Tobacco Use  . Smoking status: Former Smoker    Packs/day: 0.50    Years: 10.00    Pack years: 5.00    Types: Cigarettes    Last attempt to quit: 04/20/2007    Years since quitting: 10.8  . Smokeless tobacco: Never Used  . Tobacco comment: social smoker back then  Substance and Sexual Activity  . Alcohol use: Yes    Comment: occas  . Drug use: No  . Sexual activity:  Yes  Lifestyle  . Physical activity:    Days per week: Not on file    Minutes per session: Not on file  . Stress: Not on file  Relationships  . Social connections:    Talks on phone: Not on file    Gets together: Not on file    Attends religious service: Not on file    Active member of club or organization: Not on file    Attends meetings of clubs or organizations: Not on file    Relationship status: Not on file  . Intimate partner violence:    Fear of current or ex partner: Not on file    Emotionally abused: Not on file    Physically abused: Not on file    Forced sexual activity: Not on file  Other Topics Concern  . Not on file  Social History Narrative  . Not on file    Family History  Problem Relation Age of Onset  . Breast cancer Mother 17  . Diabetes Father   . Cancer Maternal Uncle        colon  . Breast cancer Maternal Grandmother 36     Current Outpatient  Medications:  .  ALPRAZolam (XANAX) 0.25 MG tablet, Take 1 tablet (0.25 mg total) by mouth 2 (two) times daily as needed for anxiety., Disp: 60 tablet, Rfl: 0 .  Cholecalciferol (VITAMIN D) 50 MCG (2000 UT) CAPS, Take 2,000 Units by mouth daily., Disp: , Rfl:  .  docusate sodium (COLACE) 100 MG capsule, Take 100 mg by mouth 2 (two) times daily., Disp: , Rfl:  .  famotidine (PEPCID) 20 MG tablet, , Disp: , Rfl: 0 .  ibuprofen (ADVIL,MOTRIN) 200 MG tablet, Take 400 mg by mouth daily as needed for moderate pain., Disp: , Rfl:  .  lidocaine-prilocaine (EMLA) cream, Apply to affected area once, Disp: 30 g, Rfl: 3 .  losartan (COZAAR) 50 MG tablet, TAKE 1 TABLET BY MOUTH DAILY, Disp: 90 tablet, Rfl: 3 .  ondansetron (ZOFRAN) 8 MG tablet, Take 1 tablet (8 mg total) by mouth 2 (two) times daily as needed., Disp: 30 tablet, Rfl: 2 .  prochlorperazine (COMPAZINE) 10 MG tablet, Take 1 tablet (10 mg total) by mouth every 6 (six) hours as needed (Nausea or vomiting)., Disp: 60 tablet, Rfl: 2 .  Ascorbic Acid (VITAMIN C PO), Take 1 tablet by mouth daily. , Disp: , Rfl:  .  EPINEPHrine 0.3 mg/0.3 mL IJ SOAJ injection, Inject 0.3 mLs (0.3 mg total) into the muscle once. (Patient not taking: Reported on 02/26/2018), Disp: 1 Device, Rfl: 12 .  fluticasone (FLONASE) 50 MCG/ACT nasal spray, Place 1 spray into both nostrils daily as needed for allergies or rhinitis., Disp: , Rfl:  .  L-Methylfolate 15 MG TABS, TAKE 1/2 TABLET (7.5 MG) BY MOUTH DAILY (Patient not taking: No sig reported), Disp: 45 tablet, Rfl: 3 .  montelukast (SINGULAIR) 10 MG tablet, Take 1 tablet (10 mg total) by mouth at bedtime. (Patient not taking: Reported on 02/26/2018), Disp: 30 tablet, Rfl: 12 .  Probiotic CAPS, Take 1 capsule by mouth at bedtime., Disp: , Rfl:  .  tretinoin (RETIN-A) 0.1 % cream, Apply 1 application topically at bedtime., Disp: , Rfl:   Physical exam:  Vitals:   02/26/18 0848  BP: (!) 143/85  Pulse: (!) 105  Resp: 16    Temp: 97.6 F (36.4 C)  TempSrc: Tympanic  Weight: 141 lb (64 kg)   Physical Exam Exam conducted with a  chaperone present.  Constitutional:      General: She is not in acute distress.    Comments: Thin build  HENT:     Head: Normocephalic and atraumatic.     Nose:     Right Turbinates: Swollen. Not enlarged or pale.     Left Turbinates: Not enlarged, swollen or pale.     Right Sinus: No maxillary sinus tenderness or frontal sinus tenderness.     Left Sinus: No maxillary sinus tenderness or frontal sinus tenderness.     Comments: Circular nasal ulceration right nare, reddened- ttp    Mouth/Throat:     Lips: Pink. No lesions.     Mouth: Mucous membranes are moist. Oral lesions (circular ulceration left side behind back tooth- reddened, flat) present.     Tongue: Lesions (pinpoint flat reddened ulcerations scattered  sides of tongue) present.     Pharynx: No pharyngeal swelling or oropharyngeal exudate.  Eyes:     General: No scleral icterus.    Conjunctiva/sclera: Conjunctivae normal.  Neck:     Musculoskeletal: Neck supple. No muscular tenderness.  Cardiovascular:     Rate and Rhythm: Normal rate and regular rhythm.  Pulmonary:     Effort: Pulmonary effort is normal.     Breath sounds: Normal breath sounds.  Abdominal:     General: Abdomen is flat. There is no distension.     Palpations: Abdomen is soft.     Tenderness: There is no abdominal tenderness. There is no guarding.  Genitourinary:    Labia:        Right: Lesion (irregularly shaped lesion right vulva at 11:00, reddened, tender, flat, ~ 15m) present.        Left: No rash, tenderness, lesion or injury.      Urethra: No urethral pain, urethral swelling or urethral lesion.     Comments: deferred Musculoskeletal:     Right lower leg: No edema.     Left lower leg: No edema.  Skin:    General: Skin is warm and dry.     Findings: No lesion or rash.  Neurological:     Mental Status: She is alert and oriented to  person, place, and time.  Psychiatric:        Mood and Affect: Mood is anxious.      CMP Latest Ref Rng & Units 02/14/2018  Glucose 70 - 99 mg/dL 113(H)  BUN 6 - 20 mg/dL 17  Creatinine 0.44 - 1.00 mg/dL 0.64  Sodium 135 - 145 mmol/L 140  Potassium 3.5 - 5.1 mmol/L 3.9  Chloride 98 - 111 mmol/L 102  CO2 22 - 32 mmol/L 27  Calcium 8.9 - 10.3 mg/dL 9.8  Total Protein 6.5 - 8.1 g/dL 7.3  Total Bilirubin 0.3 - 1.2 mg/dL 0.4  Alkaline Phos 38 - 126 U/L 74  AST 15 - 41 U/L 24  ALT 0 - 44 U/L 16   CBC Latest Ref Rng & Units 02/14/2018  WBC 4.0 - 10.5 K/uL 14.2(H)  Hemoglobin 12.0 - 15.0 g/dL 10.2(L)  Hematocrit 36.0 - 46.0 % 30.7(L)  Platelets 150 - 400 K/uL 262    No images are attached to the encounter.  No results found.  Assessment and plan- Patient is a 48y.o. female diagnosed with breast cancer who presents to symptom management clinic for skin ulcerations.  1.  Clinical stage IIIb triple negative invasive carcinoma of the lower inner quadrant of left breast-currently s/p 4 cycles of neoadjuvant Adriamycin-Cytoxan with Udenyca support. Plan  for starting neoadjuvant carbo-Taxol on 02/28/2018.  2.  Skin changes associated with chemotherapy-etiology unclear but questionably viral exacerbation.  HSV 1 and 2 swab collected today on vulvar lesion and reported as negative. Discussed Valtrex but patient reluctant and requests consideration of holistic approach.  Start lidocaine gel for comfort/pain. Can also try tylenol for pain.  Discussed folic acid 1 mg/day and lysine 3g/day if symptomatic with ulcers are currently in clinical trials and have shown some effect and likely will be tolerated well. Patient agreeable.  Discussed that if she has additional mouth sores or sores are preventing her from eating/drinking I would like to consider additional treatments.   Follow-up with Dr. Grayland Ormond as scheduled on 02/28/2018.  Patient advised to notify the clinic if there is no improvement in  symptoms or if symptoms worsen.  Visit Diagnosis 1. Skin changes related to chemotherapy   2. Primary cancer of lower-inner quadrant of left female breast [facility])     Patient expressed understanding and was in agreement with this plan. She also understands that She can call clinic at any time with any questions, concerns, or complaints.   Thank you for allowing me to participate in the care of this very pleasant patient.   Beckey Rutter, DNP, AGNP-C [place] at [facility] [phone] (work cell) [phone] (office)

## 2018-02-27 ENCOUNTER — Other Ambulatory Visit: Payer: Self-pay | Admitting: Oncology

## 2018-02-28 ENCOUNTER — Encounter: Payer: Self-pay | Admitting: Oncology

## 2018-02-28 ENCOUNTER — Telehealth: Payer: Self-pay | Admitting: Nurse Practitioner

## 2018-02-28 ENCOUNTER — Other Ambulatory Visit: Payer: Self-pay

## 2018-02-28 ENCOUNTER — Inpatient Hospital Stay: Payer: No Typology Code available for payment source

## 2018-02-28 ENCOUNTER — Inpatient Hospital Stay (HOSPITAL_BASED_OUTPATIENT_CLINIC_OR_DEPARTMENT_OTHER): Payer: No Typology Code available for payment source | Admitting: Oncology

## 2018-02-28 VITALS — BP 135/91 | HR 104 | Temp 98.5°F | Resp 18 | Wt 140.0 lb

## 2018-02-28 VITALS — BP 123/77 | HR 89 | Resp 16

## 2018-02-28 DIAGNOSIS — Z87891 Personal history of nicotine dependence: Secondary | ICD-10-CM

## 2018-02-28 DIAGNOSIS — C50312 Malignant neoplasm of lower-inner quadrant of left female breast: Secondary | ICD-10-CM | POA: Diagnosis not present

## 2018-02-28 DIAGNOSIS — Z171 Estrogen receptor negative status [ER-]: Secondary | ICD-10-CM

## 2018-02-28 DIAGNOSIS — Z5111 Encounter for antineoplastic chemotherapy: Secondary | ICD-10-CM | POA: Diagnosis not present

## 2018-02-28 LAB — CBC WITH DIFFERENTIAL/PLATELET
Abs Immature Granulocytes: 1.35 10*3/uL — ABNORMAL HIGH (ref 0.00–0.07)
Basophils Absolute: 0.2 10*3/uL — ABNORMAL HIGH (ref 0.0–0.1)
Basophils Relative: 1 %
Eosinophils Absolute: 0.1 10*3/uL (ref 0.0–0.5)
Eosinophils Relative: 1 %
HEMATOCRIT: 29.5 % — AB (ref 36.0–46.0)
Hemoglobin: 9.8 g/dL — ABNORMAL LOW (ref 12.0–15.0)
Immature Granulocytes: 10 %
Lymphocytes Relative: 10 %
Lymphs Abs: 1.3 10*3/uL (ref 0.7–4.0)
MCH: 30.6 pg (ref 26.0–34.0)
MCHC: 33.2 g/dL (ref 30.0–36.0)
MCV: 92.2 fL (ref 80.0–100.0)
MONO ABS: 1.1 10*3/uL — AB (ref 0.1–1.0)
MONOS PCT: 9 %
Neutro Abs: 9.3 10*3/uL — ABNORMAL HIGH (ref 1.7–7.7)
Neutrophils Relative %: 69 %
Platelets: 247 10*3/uL (ref 150–400)
RBC: 3.2 MIL/uL — ABNORMAL LOW (ref 3.87–5.11)
RDW: 16.1 % — ABNORMAL HIGH (ref 11.5–15.5)
WBC: 13.4 10*3/uL — ABNORMAL HIGH (ref 4.0–10.5)
nRBC: 0.2 % (ref 0.0–0.2)

## 2018-02-28 LAB — COMPREHENSIVE METABOLIC PANEL
ALT: 17 U/L (ref 0–44)
AST: 25 U/L (ref 15–41)
Albumin: 4.2 g/dL (ref 3.5–5.0)
Alkaline Phosphatase: 70 U/L (ref 38–126)
Anion gap: 6 (ref 5–15)
BUN: 15 mg/dL (ref 6–20)
CO2: 29 mmol/L (ref 22–32)
Calcium: 9.7 mg/dL (ref 8.9–10.3)
Chloride: 101 mmol/L (ref 98–111)
Creatinine, Ser: 0.67 mg/dL (ref 0.44–1.00)
GFR calc Af Amer: >60 mL/min (ref >60)
GFR calc non Af Amer: >60 mL/min (ref >60)
Glucose, Bld: 98 mg/dL (ref 70–99)
Potassium: 3.9 mmol/L (ref 3.5–5.1)
Sodium: 136 mmol/L (ref 135–145)
Total Bilirubin: 0.5 mg/dL (ref 0.3–1.2)
Total Protein: 7.2 g/dL (ref 6.5–8.1)

## 2018-02-28 LAB — MISC LABCORP TEST (SEND OUT): Labcorp test code: 188056

## 2018-02-28 MED ORDER — SODIUM CHLORIDE 0.9 % IV SOLN
676.2000 mg | Freq: Once | INTRAVENOUS | Status: AC
  Administered 2018-02-28: 680 mg via INTRAVENOUS
  Filled 2018-02-28: qty 68

## 2018-02-28 MED ORDER — SODIUM CHLORIDE 0.9 % IV SOLN
Freq: Once | INTRAVENOUS | Status: AC
  Administered 2018-02-28: 11:00:00 via INTRAVENOUS
  Filled 2018-02-28: qty 5

## 2018-02-28 MED ORDER — SODIUM CHLORIDE 0.9% FLUSH
10.0000 mL | INTRAVENOUS | Status: DC | PRN
  Filled 2018-02-28: qty 10

## 2018-02-28 MED ORDER — HEPARIN SOD (PORK) LOCK FLUSH 100 UNIT/ML IV SOLN
500.0000 [IU] | Freq: Once | INTRAVENOUS | Status: AC | PRN
  Administered 2018-02-28: 500 [IU]
  Filled 2018-02-28: qty 5

## 2018-02-28 MED ORDER — DIPHENHYDRAMINE HCL 50 MG/ML IJ SOLN
25.0000 mg | Freq: Once | INTRAMUSCULAR | Status: AC
  Administered 2018-02-28: 25 mg via INTRAVENOUS
  Filled 2018-02-28: qty 1

## 2018-02-28 MED ORDER — PALONOSETRON HCL INJECTION 0.25 MG/5ML
0.2500 mg | Freq: Once | INTRAVENOUS | Status: AC
  Administered 2018-02-28: 0.25 mg via INTRAVENOUS
  Filled 2018-02-28: qty 5

## 2018-02-28 MED ORDER — LORAZEPAM 2 MG/ML IJ SOLN
0.5000 mg | Freq: Once | INTRAMUSCULAR | Status: AC
  Administered 2018-02-28: 0.5 mg via INTRAVENOUS
  Filled 2018-02-28: qty 1

## 2018-02-28 MED ORDER — SODIUM CHLORIDE 0.9 % IV SOLN
80.0000 mg/m2 | Freq: Once | INTRAVENOUS | Status: AC
  Administered 2018-02-28: 138 mg via INTRAVENOUS
  Filled 2018-02-28: qty 23

## 2018-02-28 MED ORDER — SODIUM CHLORIDE 0.9 % IV SOLN
Freq: Once | INTRAVENOUS | Status: AC
  Administered 2018-02-28: 10:00:00 via INTRAVENOUS
  Filled 2018-02-28: qty 250

## 2018-02-28 MED ORDER — FAMOTIDINE IN NACL 20-0.9 MG/50ML-% IV SOLN
20.0000 mg | Freq: Once | INTRAVENOUS | Status: AC
  Administered 2018-02-28: 20 mg via INTRAVENOUS
  Filled 2018-02-28: qty 50

## 2018-02-28 NOTE — Telephone Encounter (Signed)
Attempted to call patient with results. Message left to return call.

## 2018-03-01 NOTE — Progress Notes (Signed)
[place]  Telephone:(336) [phone] Fax:(336) [phone]  ID: Shelby [name] OB: [birth year]  MR#: [id number]  YBO#:[id number]  Patient Care Team: Jerrol Banana., MD as PCP - General (Family Medicine) [name], MD as Consulting Physician (Cardiology)  CHIEF COMPLAINT: Clinical stage IIIB triple negative invasive carcinoma of the lower inner quadrant of the left breast.  INTERVAL HISTORY: Patient returns to clinic today for further evaluation and consideration of cycle 5, day 8 of carboplatinum and Taxol.  Taxol only today.  She was seen in symptom management clinic earlier this week with increasing fatigue and nausea, but after IV fluids felt "85%" better.  She currently feels well and is asymptomatic. Her vulvar pain has resolved.  She has no neurologic complaints.  She has a good appetite and denies weight loss.  She has no chest pain or shortness of breath.  She denies any nausea, vomiting, constipation, or diarrhea.  She has no urinary complaints.  Patient offers no further specific complaints today.  REVIEW OF SYSTEMS:   Review of Systems  Constitutional: Negative.  Negative for fever, malaise/fatigue and weight loss.  HENT: Negative.  Negative for congestion, ear pain and sore throat.   Respiratory: Negative.  Negative for cough, hemoptysis and shortness of breath.   Cardiovascular: Negative.  Negative for chest pain and leg swelling.  Gastrointestinal: Negative.  Negative for abdominal pain and melena.  Genitourinary: Negative.  Negative for dysuria.  Musculoskeletal: Negative.  Negative for back pain.  Skin: Negative.  Negative for rash.  Neurological: Negative.  Negative for dizziness, focal weakness, weakness and headaches.  Psychiatric/Behavioral: The patient is nervous/anxious.     As per HPI. Otherwise, a complete review of systems is negative.  PAST MEDICAL HISTORY: Past Medical History:  Diagnosis Date  . Anemia    h/o with  pregnancy  . Anxiety   . Asthma    allergy induced-no inhaler  . Cancer ([place])   . Complication of anesthesia   . Environmental allergies   . Family history of adverse reaction to anesthesia    son-breathing problems-coded 1st time when he was 2 and had another surgery at 77 and had to be admitted for breathing problems  . Family history of breast cancer   . GERD (gastroesophageal reflux disease)   . Headache    migraines  . Hypertension   . Mitral valve disease   . Mitral valve prolapse   . Painful menstrual periods   . PONV (postoperative nausea and vomiting)   . Vertigo     PAST SURGICAL HISTORY: Past Surgical History:  Procedure Laterality Date  . ABDOMINAL HYSTERECTOMY  2010   supracervical   . BUNIONECTOMY Bilateral   . MANDIBLE RECONSTRUCTION     age 57-underbite  . PORTACATH PLACEMENT Right 12/31/2017   Procedure: INSERTION PORT-A-CATH;  Surgeon: Robert Bellow, MD;  Location: [facility] ORS;  Service: General;  Laterality: Right;  . SHOULDER ARTHROSCOPY WITH OPEN ROTATOR CUFF REPAIR Right 04/26/2017   Procedure: SHOULDER ARTHROSCOPY WITH OPEN ROTATOR CUFF REPAIR,SUBACROMINAL DECOMPRESSION;  Surgeon: Thornton Park, MD;  Location: [facility] ORS;  Service: Orthopedics;  Laterality: Right;  . TONSILLECTOMY      FAMILY HISTORY: Family History  Problem Relation Age of Onset  . Breast cancer Mother 40  . Diabetes Father   . Cancer Maternal Uncle        colon  . Breast cancer Maternal Grandmother 9    ADVANCED DIRECTIVES (Y/N):  N  HEALTH MAINTENANCE: Social History  Tobacco Use  . Smoking status: Former Smoker    Packs/day: 0.50    Years: 10.00    Pack years: 5.00    Types: Cigarettes    Last attempt to quit: 04/20/2007    Years since quitting: 10.8  . Smokeless tobacco: Never Used  . Tobacco comment: social smoker back then  Substance Use Topics  . Alcohol use: Yes    Comment: occas  . Drug use: No     Colonoscopy:  PAP:  Bone density:  Lipid  panel:  Allergies  Allergen Reactions  . Hydrocodone-Acetaminophen Hives    swollen face  . Iodine Hives  . Peanut Butter Flavor Other (See Comments)    Made mouth tingle  . Shellfish Allergy Hives    Current Outpatient Medications  Medication Sig Dispense Refill  . ALPRAZolam (XANAX) 0.25 MG tablet Take 1 tablet (0.25 mg total) by mouth 2 (two) times daily as needed for anxiety. 60 tablet 0  . Ascorbic Acid (VITAMIN C PO) Take 1 tablet by mouth daily.     . Cholecalciferol (VITAMIN D) 50 MCG (2000 UT) CAPS Take 2,000 Units by mouth daily.    Marland Kitchen docusate sodium (COLACE) 100 MG capsule Take 100 mg by mouth 2 (two) times daily.    Marland Kitchen EPINEPHrine 0.3 mg/0.3 mL IJ SOAJ injection Inject 0.3 mLs (0.3 mg total) into the muscle once. 1 Device 12  . famotidine (PEPCID) 20 MG tablet   0  . fluticasone (FLONASE) 50 MCG/ACT nasal spray Place 1 spray into both nostrils daily as needed for allergies or rhinitis.    Marland Kitchen ibuprofen (ADVIL,MOTRIN) 200 MG tablet Take 400 mg by mouth daily as needed for moderate pain.    Marland Kitchen L-Methylfolate 15 MG TABS TAKE 1/2 TABLET (7.5 MG) BY MOUTH DAILY 45 tablet 3  . lidocaine (XYLOCAINE) 2 % jelly Apply 1 application topically as needed. For pain 30 mL 0  . lidocaine-prilocaine (EMLA) cream Apply to affected area once 30 g 3  . losartan (COZAAR) 50 MG tablet TAKE 1 TABLET BY MOUTH DAILY 90 tablet 3  . montelukast (SINGULAIR) 10 MG tablet Take 1 tablet (10 mg total) by mouth at bedtime. 30 tablet 12  . ondansetron (ZOFRAN) 8 MG tablet Take 1 tablet (8 mg total) by mouth 2 (two) times daily as needed. 30 tablet 2  . Probiotic CAPS Take 1 capsule by mouth at bedtime.    . prochlorperazine (COMPAZINE) 10 MG tablet Take 1 tablet (10 mg total) by mouth every 6 (six) hours as needed (Nausea or vomiting). 60 tablet 2  . tretinoin (RETIN-A) 0.1 % cream Apply 1 application topically at bedtime.     No current facility-administered medications for this visit.      OBJECTIVE: Vitals:   03/07/18 0943  BP: 132/88  Pulse: (!) 102  Temp: (!) 97.1 F (36.2 C)     Body mass index is 22.13 kg/m.    ECOG FS:0 - Asymptomatic  General: Well-developed, well-nourished, no acute distress. Eyes: Pink conjunctiva, anicteric sclera. HEENT: Normocephalic, moist mucous membranes. Lungs: Clear to auscultation bilaterally. Heart: Regular rate and rhythm. No rubs, murmurs, or gallops. Abdomen: Soft, nontender, nondistended. No organomegaly noted, normoactive bowel sounds. Musculoskeletal: No edema, cyanosis, or clubbing. Neuro: Alert, answering all questions appropriately. Cranial nerves grossly intact. Skin: No rashes or petechiae noted. Psych: Normal affect.  LAB RESULTS:  Lab Results  Component Value Date   NA 134 (L) 03/07/2018   K 4.2 03/07/2018   CL 99 03/07/2018  CO2 29 03/07/2018   GLUCOSE 104 (H) 03/07/2018   BUN 17 03/07/2018   CREATININE 0.48 03/07/2018   CALCIUM 9.6 03/07/2018   PROT 7.0 03/07/2018   ALBUMIN 4.2 03/07/2018   AST 38 03/07/2018   ALT 44 03/07/2018   ALKPHOS 56 03/07/2018   BILITOT 0.8 03/07/2018   GFRNONAA >60 03/07/2018   GFRAA >60 03/07/2018    Lab Results  Component Value Date   WBC 2.1 (L) 03/07/2018   NEUTROABS 1.3 (L) 03/07/2018   HGB 8.8 (L) 03/07/2018   HCT 25.9 (L) 03/07/2018   MCV 89.9 03/07/2018   PLT 194 03/07/2018     STUDIES: No results found.  ASSESSMENT: Clinical stage IIIB triple negative invasive carcinoma of the lower inner quadrant of the left breast.  PLAN:    1. Clinical stage IIIB triple negative invasive carcinoma of the lower inner quadrant of the left breast: CT scan results from January 01, 2018 did not reveal any metastatic disease.  Given patient's stage of disease, she will benefit from neoadjuvant chemotherapy using Adriamycin, Cytoxan with Udenyca support followed by carboplatinum and Taxol.  She has now completed 4 cycles of Adriamycin and Cytoxan.  Patient has indicated  she may prefer to undergo mastectomy, therefore adjuvant XRT may not be needed.  An aromatase inhibitor would not offer any benefit given the triple negative status of her disease.  Also discussed today with the possibility of using capecitabine adjuvantly if patient does not have a complete pathologic response.  MUGA scan on January 01, 2018 revealed an EF of 71%.  Proceed with cycle 5, day 8 of carboplatinum and Taxol.  Taxol only today.  Return to clinic in 1 week for further evaluation and consideration of cycle 5, day 15. 2.  Anxiety: Chronic.  Continue Xanax as needed. 3.  Anemia: Patient's hemoglobin 8.8 today, monitor. 4.  Headache: Patient does not complain of this today.  Continue ibuprofen sparingly as needed. 5.  Neutropenia: Patient's ANC is 1.3 today.  Proceed with treatment cautiously as above.  Patient expressed understanding and was in agreement with this plan. She also understands that She can call clinic at any time with any questions, concerns, or complaints.   Cancer Staging Primary cancer of lower-inner quadrant of left female breast [facility] Staging form: Breast, AJCC 8th Edition - Clinical stage from 12/24/2017: Stage IIIB (cT2, cN1, cM0, G3, ER-, PR-, HER2-) - Signed by Lloyd Huger, MD on 12/24/2017 - Pathologic: No stage assigned - Unsigned   Lloyd Huger, MD   03/07/2018 2:53 PM

## 2018-03-04 ENCOUNTER — Encounter: Payer: Self-pay | Admitting: Oncology

## 2018-03-04 ENCOUNTER — Ambulatory Visit: Payer: Self-pay

## 2018-03-04 ENCOUNTER — Inpatient Hospital Stay: Payer: No Typology Code available for payment source | Attending: Oncology | Admitting: Oncology

## 2018-03-04 ENCOUNTER — Other Ambulatory Visit: Payer: Self-pay

## 2018-03-04 VITALS — BP 132/93 | HR 102 | Temp 98.2°F

## 2018-03-04 DIAGNOSIS — D649 Anemia, unspecified: Secondary | ICD-10-CM | POA: Insufficient documentation

## 2018-03-04 DIAGNOSIS — G629 Polyneuropathy, unspecified: Secondary | ICD-10-CM | POA: Diagnosis not present

## 2018-03-04 DIAGNOSIS — R109 Unspecified abdominal pain: Secondary | ICD-10-CM | POA: Diagnosis not present

## 2018-03-04 DIAGNOSIS — R252 Cramp and spasm: Secondary | ICD-10-CM

## 2018-03-04 DIAGNOSIS — R530 Neoplastic (malignant) related fatigue: Secondary | ICD-10-CM | POA: Insufficient documentation

## 2018-03-04 DIAGNOSIS — E860 Dehydration: Secondary | ICD-10-CM | POA: Insufficient documentation

## 2018-03-04 DIAGNOSIS — Z171 Estrogen receptor negative status [ER-]: Secondary | ICD-10-CM | POA: Insufficient documentation

## 2018-03-04 DIAGNOSIS — Z5111 Encounter for antineoplastic chemotherapy: Secondary | ICD-10-CM | POA: Insufficient documentation

## 2018-03-04 DIAGNOSIS — C773 Secondary and unspecified malignant neoplasm of axilla and upper limb lymph nodes: Secondary | ICD-10-CM | POA: Insufficient documentation

## 2018-03-04 DIAGNOSIS — E871 Hypo-osmolality and hyponatremia: Secondary | ICD-10-CM | POA: Insufficient documentation

## 2018-03-04 DIAGNOSIS — C50312 Malignant neoplasm of lower-inner quadrant of left female breast: Secondary | ICD-10-CM | POA: Diagnosis not present

## 2018-03-04 DIAGNOSIS — D709 Neutropenia, unspecified: Secondary | ICD-10-CM | POA: Insufficient documentation

## 2018-03-04 DIAGNOSIS — F419 Anxiety disorder, unspecified: Secondary | ICD-10-CM | POA: Diagnosis not present

## 2018-03-04 DIAGNOSIS — R110 Nausea: Secondary | ICD-10-CM

## 2018-03-04 DIAGNOSIS — Z95828 Presence of other vascular implants and grafts: Secondary | ICD-10-CM

## 2018-03-04 LAB — COMPREHENSIVE METABOLIC PANEL
ALT: 39 U/L (ref 0–44)
AST: 35 U/L (ref 15–41)
Albumin: 4.3 g/dL (ref 3.5–5.0)
Alkaline Phosphatase: 65 U/L (ref 38–126)
Anion gap: 6 (ref 5–15)
BUN: 20 mg/dL (ref 6–20)
CO2: 26 mmol/L (ref 22–32)
Calcium: 9.2 mg/dL (ref 8.9–10.3)
Chloride: 99 mmol/L (ref 98–111)
Creatinine, Ser: 0.55 mg/dL (ref 0.44–1.00)
GFR calc Af Amer: >60 mL/min (ref >60)
GFR calc non Af Amer: >60 mL/min (ref >60)
Glucose, Bld: 123 mg/dL — ABNORMAL HIGH (ref 70–99)
POTASSIUM: 3.8 mmol/L (ref 3.5–5.1)
SODIUM: 131 mmol/L — AB (ref 135–145)
Total Bilirubin: 0.8 mg/dL (ref 0.3–1.2)
Total Protein: 7.4 g/dL (ref 6.5–8.1)

## 2018-03-04 LAB — CBC WITH DIFFERENTIAL/PLATELET
Abs Immature Granulocytes: 0.02 10*3/uL (ref 0.00–0.07)
Basophils Absolute: 0 10*3/uL (ref 0.0–0.1)
Basophils Relative: 0 %
Eosinophils Absolute: 0 10*3/uL (ref 0.0–0.5)
Eosinophils Relative: 0 %
HCT: 29.5 % — ABNORMAL LOW (ref 36.0–46.0)
Hemoglobin: 10.2 g/dL — ABNORMAL LOW (ref 12.0–15.0)
Immature Granulocytes: 0 %
Lymphocytes Relative: 9 %
Lymphs Abs: 0.5 10*3/uL — ABNORMAL LOW (ref 0.7–4.0)
MCH: 30.8 pg (ref 26.0–34.0)
MCHC: 34.6 g/dL (ref 30.0–36.0)
MCV: 89.1 fL (ref 80.0–100.0)
Monocytes Absolute: 0.2 10*3/uL (ref 0.1–1.0)
Monocytes Relative: 3 %
Neutro Abs: 5 10*3/uL (ref 1.7–7.7)
Neutrophils Relative %: 88 %
Platelets: 181 10*3/uL (ref 150–400)
RBC: 3.31 MIL/uL — AB (ref 3.87–5.11)
RDW: 15.4 % (ref 11.5–15.5)
WBC: 5.8 10*3/uL (ref 4.0–10.5)
nRBC: 0 % (ref 0.0–0.2)

## 2018-03-04 LAB — MAGNESIUM: Magnesium: 2 mg/dL (ref 1.7–2.4)

## 2018-03-04 MED ORDER — SODIUM CHLORIDE 0.9% FLUSH
10.0000 mL | INTRAVENOUS | Status: DC | PRN
  Administered 2018-03-04: 10 mL via INTRAVENOUS
  Filled 2018-03-04: qty 10

## 2018-03-04 MED ORDER — HEPARIN SOD (PORK) LOCK FLUSH 100 UNIT/ML IV SOLN
500.0000 [IU] | Freq: Once | INTRAVENOUS | Status: AC
  Administered 2018-03-04: 500 [IU] via INTRAVENOUS
  Filled 2018-03-04: qty 5

## 2018-03-04 MED ORDER — DEXAMETHASONE SODIUM PHOSPHATE 10 MG/ML IJ SOLN
10.0000 mg | Freq: Once | INTRAMUSCULAR | Status: DC
  Filled 2018-03-04: qty 1

## 2018-03-04 MED ORDER — PROCHLORPERAZINE EDISYLATE 10 MG/2ML IJ SOLN
10.0000 mg | Freq: Once | INTRAMUSCULAR | Status: DC
  Filled 2018-03-04: qty 2

## 2018-03-04 MED ORDER — SODIUM CHLORIDE 0.9 % IV SOLN
Freq: Once | INTRAVENOUS | Status: AC
  Administered 2018-03-04: 14:00:00 via INTRAVENOUS
  Filled 2018-03-04: qty 250

## 2018-03-04 NOTE — Progress Notes (Addendum)
Symptom Management Consult note [facility]  [phone] [phone]  Patient Care Team: Shelby Banana., MD as PCP - General (Family Medicine) Shelby Merritts, MD as Consulting Physician (Cardiology)   Name of the [name]  [id number]  10-27-1970   Date of visit: 03/04/2018  Diagnosis: Stage IIIb triple negative invasive carcinoma of the lower inner quadrant of the left breast  Chief Complaint: abdominal pain/weakness/burning sensation in legs and fingers  Current Treatment: Completed 4 cycles AC on 02/14/18. Began weekly carbo/taxol  (Carbo/taxol day 1, taxol only day 8, 15) on 02/28/18.   Oncology History: She was seen last by primary oncologist Dr. Grayland [name] on 02/28/2018 prior to beginning weekly Taxol/carbo.  Vulvar pain had resolved and she offered no additional complaints.  Labs were appropriate to proceed with cycle 1 carbo/Taxol and she was scheduled to return to clinic in 1 week for cycle 1 day 8 of Taxol only.  She was seen in [facility] on 02/26/2018 by Shelby Rutter, NP for vulvar, mouth and nasal ulcers.  Patient was swabbed for HSV and a prescription for Valtrex was sent to her pharmacy.  Patient refused prescription and wished to go a more natural route.  Patient was negative for HSV and lesions improved spontaneously.  Oncology History   Initially seen by nurse midwife Shelby [name] at gynecologist office for left tender breast mass.  Had breast ultrasound and and mammogram revealing suspicious palpable mass of left breast requiring biopsy.  Biopsy revealed grade 3 invasive ductal carcinoma.  Positive metastatic carcinoma and left axillary lymph node.  Met with Dr. Grayland [name] on 12/24/2017 where he recommended neoadjuvant chemotherapy using Adriamycin, Cytoxan with Neulasta support followed by carbo/Taxol.  XRT may not be needed given patient would like mastectomy of left breast.   Had port placed by Dr. Bary [name] on  12/31/2017.  CT abdomen/pelvis/chest with contrast on 01/02/2018 did not reveal metastatic disease.  Pretreatment MUGA revealed an EF of 71%.      Primary cancer of lower-inner quadrant of left female breast ([place])   12/23/2017 Initial Diagnosis    Primary cancer of lower-inner quadrant of left female breast ([place])    12/24/2017 Cancer Staging    Staging form: Breast, AJCC 8th Edition - Clinical stage from 12/24/2017: Stage IIIB (cT2, cN1, cM0, G3, ER-, PR-, HER2-) - Signed by Shelby Huger, MD on 12/24/2017    01/03/2018 -  Chemotherapy    The patient had DOXOrubicin (ADRIAMYCIN) chemo injection 104 mg, 60 mg/m2 = 104 mg, Intravenous,  Once, 4 of 4 cycles Administration: 104 mg (01/03/2018), 104 mg (01/17/2018), 104 mg (01/31/2018), 104 mg (02/14/2018) palonosetron (ALOXI) injection 0.25 mg, 0.25 mg, Intravenous,  Once, 5 of 8 cycles Administration: 0.25 mg (01/03/2018), 0.25 mg (02/28/2018), 0.25 mg (01/17/2018), 0.25 mg (01/31/2018), 0.25 mg (02/14/2018) pegfilgrastim-cbqv (UDENYCA) injection 6 mg, 6 mg, Subcutaneous, Once, 4 of 4 cycles Administration: 6 mg (01/04/2018), 6 mg (01/18/2018), 6 mg (02/01/2018), 6 mg (02/15/2018) CARBOplatin (PARAPLATIN) 680 mg in sodium chloride 0.9 % 250 mL chemo infusion, 680 mg (100 % of original dose 676.2 mg), Intravenous,  Once, 1 of 4 cycles Dose modification:   (original dose 676.2 mg, Cycle 5) Administration: 680 mg (02/28/2018) cyclophosphamide (CYTOXAN) 1,000 mg in sodium chloride 0.9 % 250 mL chemo infusion, 1,040 mg, Intravenous,  Once, 4 of 4 cycles Administration: 1,000 mg (01/03/2018), 1,000 mg (01/17/2018), 1,000 mg (01/31/2018), 1,000 mg (02/14/2018) PACLitaxel (TAXOL) 138 mg in sodium chloride 0.9 % 250 mL  chemo infusion (</= 69m/m2), 80 mg/m2 = 138 mg, Intravenous,  Once, 1 of 4 cycles Administration: 138 mg (02/28/2018) fosaprepitant (EMEND) 150 mg, dexamethasone (DECADRON) 12 mg in sodium chloride 0.9 % 145 mL IVPB, , Intravenous,  Once, 5 of 8  cycles Administration:  (01/03/2018),  (02/28/2018),  (01/17/2018),  (01/31/2018),  (02/14/2018)  for chemotherapy treatment.      Subjective Data:  ECOG: 1 - Symptomatic but completely ambulatory   Subjective:     TCHARLY HUNTONis a 48y.o. female who presents for evaluation of nausea and dehydration.  It has been present for 2 days.  Associated signs & symptoms:  mild abdominal pain and inability to keep down fluids attempted to drink more fluids averaging 16 ounces of water and half a Gatorade daily.  Denies having an appetite.  Nausea controlled with antiemetics.  Admits to mild finger and toe neuropathy.  Intermittent leg cramping.  Feels weak and tired.  Dates she was hoping to feel better after beginning carbo/Taxol and finishing AC.    Review of Systems A comprehensive review of systems was negative except for: Constitutional: positive for fatigue and malaise Cardiovascular: positive for fatigue Gastrointestinal: positive for abdominal pain, constipation, nausea and vomiting Integument/breast: positive for skin lesion(s) Musculoskeletal: positive for muscle weakness and myalgias       Objective:    BP (!) 132/93 (BP Location: Right Arm, Patient Position: Sitting) Comment: standing bp 125/84  Pulse (!) 102 Comment: standing pulse 102  Temp 98.2 F (36.8 C) (Tympanic)  General appearance: alert, fatigued, no distress and pale Lungs: clear to auscultation bilaterally Abdomen: soft, non-tender; bowel sounds normal; no masses,  no organomegaly Pulses: 2+ and symmetric Skin: Skin color, texture, turgor normal. No rashes or lesions or nasal skin leison Neurologic: Grossly normal    Assessment:     Dehydration d/t recent chemo given and malnutrition.   Plan:    Antiemetics per medication orders. Dietary guidelines discussed. Discussed the diagnosis with the patient. All questions answered. IV fluids per orders. Labs per orders.    Triple negative breast cancer left  breast: Self palpated tender mass to left breast in November 2019.  Seen by gynecologist where imaging was performed revealing concerns for breast cancer.  Had biopsy revealing triple negative breast cancer with one axillary lymph node involvement. S/p 4 cycles of AC with Udencya. Had first cycle of carbo/taxol on 02/28/18. Scheduled to RTC on 03/07/18 for cycle 1, day 8 taxol only.   Dehydration: On examination, patient is frail and pale.  Dry mucous membranes.  Patient mildly orthostatic and tachycardic.  Heart rate 102.  Patient states "she feels like her heart is beating out of her chest".   Neuropathy: Intermittent.  Likely directly related to Taxol.  Keep a close eye on this throughout her treatment.  May need dosage adjustment if neuropathy becomes persistent and/or worsens.  Abdominal pain: Unclear etiology. Intermittent.  Nonreproducible during her examination.  Positive bowel sounds.  Last bowel movement was this morning.  Continue to monitor.  Plan Stat labs. (Hyponatremia 131).  Vital signs; orthostatics. (10 point drop)  In clinic administration: Give 1 liter Nacl.  Refused antiemetics while in clinic.  States she will take her Zofran and Compazine when she gets home.  New medications/prescriptions: Samples of Ensure hydration packets given with education.  Disposition: RTC on 03/07/2018 for  assessment prior to cycle 1-day 8 Taxol only.  Given tolerance with the first cycle of carbo/Taxol will schedule her back for  Friday and Monday for IV fluids and possible antiemetics.  Patient in agreement with this plan.  Appointments made.  Greater than 50% was spent in counseling and coordination of care with this patient including but not limited to discussion of the relevant topics above (See A&P) including, but not limited to diagnosis and management of acute and chronic medical conditions.   Faythe Casa, NP 03/04/2018 3:50 PM

## 2018-03-04 NOTE — Patient Instructions (Signed)
It was nice to see you again today.  I am glad you are feeling better.  Today you were given 1.5 L of IV fluids.  We did lab work that showed you were dehydrated.  Otherwise, all of your labs looked okay.  We have scheduled you for fluids on Friday afternoon and again on Monday after your next treatment.  If you develop a fever and/or persistent abdominal pain please let us know.  Try and drink plenty of fluids.  Use the supplements given to you today as you can tolerate.  Continue your antiemetics as needed.  You can take your Zofran every 8 hours.  Faythe Casa, NP 03/04/2018 2:50 PM    Dehydration, Adult  Dehydration is when there is not enough fluid or water in your body. This happens when you lose more fluids than you take in. Dehydration can range from mild to very bad. It should be treated right away to keep it from getting very bad. Symptoms of mild dehydration may include:  Thirst.  Dry lips.  Slightly dry mouth.  Dry, warm skin.  Dizziness. Symptoms of moderate dehydration may include:  Very dry mouth.  Muscle cramps.  Dark pee (urine). Pee may be the color of tea.  Your body making less pee.  Your eyes making fewer tears.  Heartbeat that is uneven or faster than normal (palpitations).  Headache.  Light-headedness, especially when you stand up from sitting.  Fainting (syncope). Symptoms of very bad dehydration may include:  Changes in skin, such as: ? Cold and clammy skin. ? Blotchy (mottled) or pale skin. ? Skin that does not quickly return to normal after being lightly pinched and let go (poor skin turgor).  Changes in body fluids, such as: ? Feeling very thirsty. ? Your eyes making fewer tears. ? Not sweating when body temperature is high, such as in hot weather. ? Your body making very little pee.  Changes in vital signs, such as: ? Weak pulse. ? Pulse that is more than 100 beats a minute when you are sitting still. ? Fast breathing. ? Low  blood pressure.  Other changes, such as: ? Sunken eyes. ? Cold hands and feet. ? Confusion. ? Lack of energy (lethargy). ? Trouble waking up from sleep. ? Short-term weight loss. ? Unconsciousness. Follow these instructions at home:   If told by your doctor, drink an ORS: ? Make an ORS by using instructions on the package. ? Start by drinking small amounts, about  cup (120 mL) every 5-10 minutes. ? Slowly drink more until you have had the amount that your doctor said to have.  Drink enough clear fluid to keep your pee clear or pale yellow. If you were told to drink an ORS, finish the ORS first, then start slowly drinking clear fluids. Drink fluids such as: ? Water. Do not drink only water by itself. Doing that can make the salt (sodium) level in your body get too low (hyponatremia). ? Ice chips. ? Fruit juice that you have added water to (diluted). ? Low-calorie sports drinks.  Avoid: ? Alcohol. ? Drinks that have a lot of sugar. These include high-calorie sports drinks, fruit juice that does not have water added, and soda. ? Caffeine. ? Foods that are greasy or have a lot of fat or sugar.  Take over-the-counter and prescription medicines only as told by your doctor.  Do not take salt tablets. Doing that can make the salt level in your body get too high (hypernatremia).  Eat foods that have minerals (electrolytes). Examples include bananas, oranges, potatoes, tomatoes, and spinach.  Keep all follow-up visits as told by your doctor. This is important. Contact a doctor if:  You have belly (abdominal) pain that: ? Gets worse. ? Stays in one area (localizes).  You have a rash.  You have a stiff neck.  You get angry or annoyed more easily than normal (irritability).  You are more sleepy than normal.  You have a harder time waking up than normal.  You feel: ? Weak. ? Dizzy. ? Very thirsty.  You have peed (urinated) only a small amount of very dark pee during 6-8  hours. Get help right away if:  You have symptoms of very bad dehydration.  You cannot drink fluids without throwing up (vomiting).  Your symptoms get worse with treatment.  You have a fever.  You have a very bad headache.  You are throwing up or having watery poop (diarrhea) and it: ? Gets worse. ? Does not go away.  You have blood or something green (bile) in your throw-up.  You have blood in your poop (stool). This may cause poop to look black and tarry.  You have not peed in 6-8 hours.  You pass out (faint).  Your heart rate when you are sitting still is more than 100 beats a minute.  You have trouble breathing. This information is not intended to replace advice given to you by your health care provider. Make sure you discuss any questions you have with your health care provider. Document Released: 11/12/2008 Document Revised: 08/06/2015 Document Reviewed: 03/12/2015 Elsevier Interactive Patient Education  2019 [employer].

## 2018-03-07 ENCOUNTER — Inpatient Hospital Stay (HOSPITAL_BASED_OUTPATIENT_CLINIC_OR_DEPARTMENT_OTHER): Payer: No Typology Code available for payment source | Admitting: Oncology

## 2018-03-07 ENCOUNTER — Other Ambulatory Visit: Payer: Self-pay

## 2018-03-07 ENCOUNTER — Inpatient Hospital Stay: Payer: No Typology Code available for payment source

## 2018-03-07 VITALS — BP 125/80 | HR 93 | Resp 18

## 2018-03-07 VITALS — BP 132/88 | HR 102 | Temp 97.1°F | Ht 66.0 in | Wt 137.1 lb

## 2018-03-07 DIAGNOSIS — Z5111 Encounter for antineoplastic chemotherapy: Secondary | ICD-10-CM | POA: Diagnosis not present

## 2018-03-07 DIAGNOSIS — D649 Anemia, unspecified: Secondary | ICD-10-CM

## 2018-03-07 DIAGNOSIS — C50312 Malignant neoplasm of lower-inner quadrant of left female breast: Secondary | ICD-10-CM

## 2018-03-07 DIAGNOSIS — F419 Anxiety disorder, unspecified: Secondary | ICD-10-CM

## 2018-03-07 DIAGNOSIS — Z171 Estrogen receptor negative status [ER-]: Secondary | ICD-10-CM | POA: Diagnosis not present

## 2018-03-07 DIAGNOSIS — D709 Neutropenia, unspecified: Secondary | ICD-10-CM

## 2018-03-07 LAB — COMPREHENSIVE METABOLIC PANEL
ALT: 44 U/L (ref 0–44)
ANION GAP: 6 (ref 5–15)
AST: 38 U/L (ref 15–41)
Albumin: 4.2 g/dL (ref 3.5–5.0)
Alkaline Phosphatase: 56 U/L (ref 38–126)
BUN: 17 mg/dL (ref 6–20)
CO2: 29 mmol/L (ref 22–32)
Calcium: 9.6 mg/dL (ref 8.9–10.3)
Chloride: 99 mmol/L (ref 98–111)
Creatinine, Ser: 0.48 mg/dL (ref 0.44–1.00)
GFR calc Af Amer: >60 mL/min (ref >60)
GFR calc non Af Amer: >60 mL/min (ref >60)
GLUCOSE: 104 mg/dL — AB (ref 70–99)
Potassium: 4.2 mmol/L (ref 3.5–5.1)
Sodium: 134 mmol/L — ABNORMAL LOW (ref 135–145)
Total Bilirubin: 0.8 mg/dL (ref 0.3–1.2)
Total Protein: 7 g/dL (ref 6.5–8.1)

## 2018-03-07 LAB — CBC WITH DIFFERENTIAL/PLATELET
ABS IMMATURE GRANULOCYTES: 0.01 10*3/uL (ref 0.00–0.07)
Basophils Absolute: 0.1 10*3/uL (ref 0.0–0.1)
Basophils Relative: 2 %
Eosinophils Absolute: 0 10*3/uL (ref 0.0–0.5)
Eosinophils Relative: 1 %
HCT: 25.9 % — ABNORMAL LOW (ref 36.0–46.0)
Hemoglobin: 8.8 g/dL — ABNORMAL LOW (ref 12.0–15.0)
Immature Granulocytes: 1 %
Lymphocytes Relative: 23 %
Lymphs Abs: 0.5 10*3/uL — ABNORMAL LOW (ref 0.7–4.0)
MCH: 30.6 pg (ref 26.0–34.0)
MCHC: 34 g/dL (ref 30.0–36.0)
MCV: 89.9 fL (ref 80.0–100.0)
MONO ABS: 0.2 10*3/uL (ref 0.1–1.0)
Monocytes Relative: 10 %
NEUTROS ABS: 1.3 10*3/uL — AB (ref 1.7–7.7)
NEUTROS PCT: 63 %
Platelets: 194 10*3/uL (ref 150–400)
RBC: 2.88 MIL/uL — ABNORMAL LOW (ref 3.87–5.11)
RDW: 15.4 % (ref 11.5–15.5)
WBC: 2.1 10*3/uL — ABNORMAL LOW (ref 4.0–10.5)
nRBC: 0 % (ref 0.0–0.2)

## 2018-03-07 MED ORDER — SODIUM CHLORIDE 0.9 % IV SOLN: 10.0000 mg | Freq: Once | INTRAVENOUS | Status: DC

## 2018-03-07 MED ORDER — HEPARIN SOD (PORK) LOCK FLUSH 100 UNIT/ML IV SOLN
500.0000 [IU] | Freq: Once | INTRAVENOUS | Status: AC
  Administered 2018-03-07: 500 [IU] via INTRAVENOUS
  Filled 2018-03-07: qty 5

## 2018-03-07 MED ORDER — DEXAMETHASONE SODIUM PHOSPHATE 10 MG/ML IJ SOLN
10.0000 mg | Freq: Once | INTRAMUSCULAR | Status: AC
  Administered 2018-03-07: 10 mg via INTRAVENOUS

## 2018-03-07 MED ORDER — SODIUM CHLORIDE 0.9 % IV SOLN
80.0000 mg/m2 | Freq: Once | INTRAVENOUS | Status: AC
  Administered 2018-03-07: 138 mg via INTRAVENOUS
  Filled 2018-03-07: qty 23

## 2018-03-07 MED ORDER — SODIUM CHLORIDE 0.9 % IV SOLN
Freq: Once | INTRAVENOUS | Status: AC
  Administered 2018-03-07: 10:00:00 via INTRAVENOUS
  Filled 2018-03-07: qty 250

## 2018-03-07 MED ORDER — DEXAMETHASONE SODIUM PHOSPHATE 10 MG/ML IJ SOLN
INTRAMUSCULAR | Status: AC
  Filled 2018-03-07: qty 1

## 2018-03-07 MED ORDER — SODIUM CHLORIDE 0.9% FLUSH
10.0000 mL | Freq: Once | INTRAVENOUS | Status: AC
  Administered 2018-03-07: 10 mL via INTRAVENOUS
  Filled 2018-03-07: qty 10

## 2018-03-07 MED ORDER — DIPHENHYDRAMINE HCL 50 MG/ML IJ SOLN
25.0000 mg | Freq: Once | INTRAMUSCULAR | Status: AC
  Administered 2018-03-07: 25 mg via INTRAVENOUS
  Filled 2018-03-07: qty 1

## 2018-03-07 MED ORDER — FAMOTIDINE IN NACL 20-0.9 MG/50ML-% IV SOLN
20.0000 mg | Freq: Once | INTRAVENOUS | Status: AC
  Administered 2018-03-07: 20 mg via INTRAVENOUS
  Filled 2018-03-07: qty 50

## 2018-03-07 NOTE — Progress Notes (Signed)
Patient is here today to follow up on her Primary cancer of lower-inner quadrant of left female breast. Patient stated that she experienced a headache after treatment and fatigued. Therefore, she felt that real bad and she ended up going to the ED, which she was given fluids. After that, she started to feel better.

## 2018-03-08 ENCOUNTER — Inpatient Hospital Stay: Payer: No Typology Code available for payment source

## 2018-03-08 ENCOUNTER — Inpatient Hospital Stay (HOSPITAL_BASED_OUTPATIENT_CLINIC_OR_DEPARTMENT_OTHER): Payer: No Typology Code available for payment source | Admitting: Nurse Practitioner

## 2018-03-08 ENCOUNTER — Encounter: Payer: Self-pay | Admitting: Nurse Practitioner

## 2018-03-08 VITALS — BP 105/69 | HR 87 | Temp 98.0°F | Resp 16

## 2018-03-08 DIAGNOSIS — C50312 Malignant neoplasm of lower-inner quadrant of left female breast: Secondary | ICD-10-CM | POA: Diagnosis not present

## 2018-03-08 DIAGNOSIS — F064 Anxiety disorder due to known physiological condition: Secondary | ICD-10-CM | POA: Diagnosis not present

## 2018-03-08 DIAGNOSIS — Z5111 Encounter for antineoplastic chemotherapy: Secondary | ICD-10-CM | POA: Diagnosis not present

## 2018-03-08 DIAGNOSIS — R530 Neoplastic (malignant) related fatigue: Secondary | ICD-10-CM

## 2018-03-08 DIAGNOSIS — G4700 Insomnia, unspecified: Secondary | ICD-10-CM

## 2018-03-08 DIAGNOSIS — R5383 Other fatigue: Secondary | ICD-10-CM

## 2018-03-08 DIAGNOSIS — C801 Malignant (primary) neoplasm, unspecified: Secondary | ICD-10-CM

## 2018-03-08 DIAGNOSIS — Z95828 Presence of other vascular implants and grafts: Secondary | ICD-10-CM

## 2018-03-08 DIAGNOSIS — F418 Other specified anxiety disorders: Secondary | ICD-10-CM

## 2018-03-08 DIAGNOSIS — Z171 Estrogen receptor negative status [ER-]: Secondary | ICD-10-CM

## 2018-03-08 DIAGNOSIS — Z87891 Personal history of nicotine dependence: Secondary | ICD-10-CM

## 2018-03-08 DIAGNOSIS — R634 Abnormal weight loss: Secondary | ICD-10-CM

## 2018-03-08 DIAGNOSIS — R51 Headache: Secondary | ICD-10-CM

## 2018-03-08 MED ORDER — HEPARIN SOD (PORK) LOCK FLUSH 100 UNIT/ML IV SOLN
500.0000 [IU] | Freq: Once | INTRAVENOUS | Status: AC
  Administered 2018-03-08: 500 [IU] via INTRAVENOUS

## 2018-03-08 MED ORDER — ONDANSETRON HCL 4 MG/2ML IJ SOLN: 4.0000 mg | Freq: Once | INTRAMUSCULAR | Status: DC

## 2018-03-08 MED ORDER — SODIUM CHLORIDE 0.9% FLUSH
10.0000 mL | INTRAVENOUS | Status: DC | PRN
  Administered 2018-03-08: 10 mL via INTRAVENOUS
  Filled 2018-03-08: qty 10

## 2018-03-08 MED ORDER — DEXAMETHASONE 4 MG PO TABS: 4.0000 mg | ORAL_TABLET | Freq: Every day | ORAL | 0 refills | Status: DC

## 2018-03-08 MED ORDER — ONDANSETRON HCL 4 MG/2ML IJ SOLN
4.0000 mg | Freq: Once | INTRAMUSCULAR | Status: AC
  Administered 2018-03-08: 4 mg via INTRAVENOUS
  Filled 2018-03-08: qty 2

## 2018-03-08 MED ORDER — ACETAMINOPHEN 500 MG PO TABS
1000.0000 mg | ORAL_TABLET | Freq: Once | ORAL | Status: AC
  Administered 2018-03-08: 1000 mg via ORAL
  Filled 2018-03-08: qty 2

## 2018-03-08 MED ORDER — DEXAMETHASONE SODIUM PHOSPHATE 10 MG/ML IJ SOLN: 10.0000 mg | Freq: Once | INTRAMUSCULAR | Status: DC

## 2018-03-08 MED ORDER — DEXAMETHASONE SODIUM PHOSPHATE 10 MG/ML IJ SOLN
10.0000 mg | Freq: Once | INTRAMUSCULAR | Status: AC
  Administered 2018-03-08: 10 mg via INTRAVENOUS
  Filled 2018-03-08: qty 1

## 2018-03-08 MED ORDER — SODIUM CHLORIDE 0.9 % IV SOLN
Freq: Once | INTRAVENOUS | Status: AC
  Administered 2018-03-08: 14:00:00 via INTRAVENOUS
  Filled 2018-03-08: qty 250

## 2018-03-08 MED ORDER — ACETAMINOPHEN 500 MG PO TABS: 1000.0000 mg | ORAL_TABLET | Freq: Once | ORAL | Status: DC

## 2018-03-08 NOTE — Progress Notes (Signed)
[facility]  Telephone:([phone] Fax:(336) [phone]  Patient Care Team: Shelby Banana., MD as PCP - General (Family Medicine) Shelby Merritts, MD as Consulting Physician (Cardiology)   Name of the [name]  [id number]  03/29/1970   Date of visit: 03/08/18  Diagnosis- Stage IIIB triple negative invasive carcinoma of the lower inner quadrant of the left breast cancer  Chief complaint/ Reason for visit- Headache & malaise  Heme/Onc history:  Oncology History   Initially seen by nurse midwife Shelby [name] at gynecologist office for left tender breast mass.  Had breast ultrasound and and mammogram revealing suspicious palpable mass of left breast requiring biopsy.  Biopsy revealed grade 3 invasive ductal carcinoma.  Positive metastatic carcinoma and left axillary lymph node.  Met with Shelby [name] on 12/24/2017 where he recommended neoadjuvant chemotherapy using Adriamycin, Cytoxan with Neulasta support followed by carbo/Taxol.  XRT may not be needed given patient would like mastectomy of left breast.   Had port placed by Dr. Bary [name] on 12/31/2017.  CT abdomen/pelvis/chest with contrast on 01/02/2018 did not reveal metastatic disease.  Pretreatment MUGA revealed an EF of 71%.      Primary cancer of lower-inner quadrant of left female breast ([place])   12/23/2017 Initial Diagnosis    Primary cancer of lower-inner quadrant of left female breast ([place])    12/24/2017 Cancer Staging    Staging form: Breast, AJCC 8th Edition - Clinical stage from 12/24/2017: Stage IIIB (cT2, cN1, cM0, G3, ER-, PR-, HER2-) - Signed by Shelby Huger, MD on 12/24/2017    01/03/2018 -  Chemotherapy    The patient had DOXOrubicin (ADRIAMYCIN) chemo injection 104 mg, 60 mg/m2 = 104 mg, Intravenous,  Once, 4 of 4 cycles Administration: 104 mg (01/03/2018), 104 mg (01/17/2018), 104 mg (01/31/2018), 104 mg (02/14/2018) palonosetron  (ALOXI) injection 0.25 mg, 0.25 mg, Intravenous,  Once, 5 of 8 cycles Administration: 0.25 mg (01/03/2018), 0.25 mg (02/28/2018), 0.25 mg (01/17/2018), 0.25 mg (01/31/2018), 0.25 mg (02/14/2018) pegfilgrastim-cbqv (UDENYCA) injection 6 mg, 6 mg, Subcutaneous, Once, 4 of 4 cycles Administration: 6 mg (01/04/2018), 6 mg (01/18/2018), 6 mg (02/01/2018), 6 mg (02/15/2018) CARBOplatin (PARAPLATIN) 680 mg in sodium chloride 0.9 % 250 mL chemo infusion, 680 mg (100 % of original dose 676.2 mg), Intravenous,  Once, 1 of 4 cycles Dose modification:   (original dose 676.2 mg, Cycle 5) Administration: 680 mg (02/28/2018) cyclophosphamide (CYTOXAN) 1,000 mg in sodium chloride 0.9 % 250 mL chemo infusion, 1,040 mg, Intravenous,  Once, 4 of 4 cycles Administration: 1,000 mg (01/03/2018), 1,000 mg (01/17/2018), 1,000 mg (01/31/2018), 1,000 mg (02/14/2018) PACLitaxel (TAXOL) 138 mg in sodium chloride 0.9 % 250 mL chemo infusion (</= 39m/m2), 80 mg/m2 = 138 mg, Intravenous,  Once, 1 of 4 cycles Administration: 138 mg (02/28/2018), 138 mg (03/07/2018) fosaprepitant (EMEND) 150 mg, dexamethasone (DECADRON) 12 mg in sodium chloride 0.9 % 145 mL IVPB, , Intravenous,  Once, 5 of 8 cycles Administration:  (01/03/2018),  (02/28/2018),  (01/17/2018),  (01/31/2018),  (02/14/2018)  for chemotherapy treatment.      Interval history- Shelby [name] 48year old female with above history of triple negative cancer, who presents to symptom management clinic for headache and malaise which started earlier today and have gradually worsened throughout the day.  She received chemotherapy treatment yesterday.  Continues to have weight loss (approximately 3-5 lbs since December). Appetite is fair. Says she has been drinking additional fluids at home.  Has not taken anything for her  symptoms today.  Cites being stress with work and feeling poorly recently.  Says that she slept poorly last night due to anxiety.   ECOG FS:2 - Symptomatic, <50% confined to  bed  Review of systems- Review of Systems  Constitutional: Positive for malaise/fatigue. Negative for chills and fever.  HENT: Negative.  Negative for congestion and sore throat.   Eyes: Negative.  Negative for blurred vision, double vision, photophobia, pain and redness.  Respiratory: Negative.  Negative for cough, sputum production, shortness of breath and wheezing.   Cardiovascular: Negative.  Negative for chest pain, palpitations and leg swelling.  Gastrointestinal: Positive for nausea. Negative for abdominal pain, blood in stool, constipation (resolved- on colace), diarrhea, heartburn, melena and vomiting.  Genitourinary: Negative.  Negative for dysuria, flank pain and urgency.  Musculoskeletal: Negative.  Negative for back pain, falls, joint pain, myalgias and neck pain.  Skin: Negative for itching and rash.  Neurological: Positive for weakness and headaches. Negative for dizziness and loss of consciousness.  Psychiatric/Behavioral: Negative for depression. The patient is nervous/anxious and has insomnia.      Current treatment- Taxol on 03/07/2018  Allergies  Allergen Reactions  . Hydrocodone-Acetaminophen Hives    swollen face  . Iodine Hives  . Peanut Butter Flavor Other (See Comments)    Made mouth tingle  . Shellfish Allergy Hives    Past Medical History:  Diagnosis Date  . Anemia    h/o with pregnancy  . Anxiety   . Asthma    allergy induced-no inhaler  . Cancer ([place])   . Complication of anesthesia   . Environmental allergies   . Family history of adverse reaction to anesthesia    son-breathing problems-coded 1st time when he was 2 and had another surgery at 33 and had to be admitted for breathing problems  . Family history of breast cancer   . GERD (gastroesophageal reflux disease)   . Headache    migraines  . Hypertension   . Mitral valve disease   . Mitral valve prolapse   . Painful menstrual periods   . PONV (postoperative nausea and vomiting)   .  Vertigo     Past Surgical History:  Procedure Laterality Date  . ABDOMINAL HYSTERECTOMY  2010   supracervical   . BUNIONECTOMY Bilateral   . MANDIBLE RECONSTRUCTION     age 55-underbite  . PORTACATH PLACEMENT Right 12/31/2017   Procedure: INSERTION PORT-A-CATH;  Surgeon: Robert Bellow, MD;  Location: [facility] ORS;  Service: General;  Laterality: Right;  . SHOULDER ARTHROSCOPY WITH OPEN ROTATOR CUFF REPAIR Right 04/26/2017   Procedure: SHOULDER ARTHROSCOPY WITH OPEN ROTATOR CUFF REPAIR,SUBACROMINAL DECOMPRESSION;  Surgeon: Thornton Park, MD;  Location: [facility] ORS;  Service: Orthopedics;  Laterality: Right;  . TONSILLECTOMY      Social History   Socioeconomic History  . Marital status: Divorced    Spouse name: Not on file  . Number of children: Not on file  . Years of education: Not on file  . Highest education level: Not on file  Occupational History  . Not on file  Social Needs  . Financial resource strain: Not on file  . Food insecurity:    Worry: Not on file    Inability: Not on file  . Transportation needs:    Medical: Not on file    Non-medical: Not on file  Tobacco Use  . Smoking status: Former Smoker    Packs/day: 0.50    Years: 10.00    Pack years: 5.00  Types: Cigarettes    Last attempt to quit: 04/20/2007    Years since quitting: 10.8  . Smokeless tobacco: Never Used  . Tobacco comment: social smoker back then  Substance and Sexual Activity  . Alcohol use: Yes    Comment: occas  . Drug use: No  . Sexual activity: Yes  Lifestyle  . Physical activity:    Days per week: Not on file    Minutes per session: Not on file  . Stress: Not on file  Relationships  . Social connections:    Talks on phone: Not on file    Gets together: Not on file    Attends religious service: Not on file    Active member of club or organization: Not on file    Attends meetings of clubs or organizations: Not on file    Relationship status: Not on file  . Intimate partner  violence:    Fear of current or ex partner: Not on file    Emotionally abused: Not on file    Physically abused: Not on file    Forced sexual activity: Not on file  Other Topics Concern  . Not on file  Social History Narrative  . Not on file    Family History  Problem Relation Age of Onset  . Breast cancer Mother 23  . Diabetes Father   . Cancer Maternal Uncle        colon  . Breast cancer Maternal Grandmother 55     Current Outpatient Medications:  .  ALPRAZolam (XANAX) 0.25 MG tablet, Take 1 tablet (0.25 mg total) by mouth 2 (two) times daily as needed for anxiety., Disp: 60 tablet, Rfl: 0 .  Ascorbic Acid (VITAMIN C PO), Take 1 tablet by mouth daily. , Disp: , Rfl:  .  Cholecalciferol (VITAMIN D) 50 MCG (2000 UT) CAPS, Take 2,000 Units by mouth daily., Disp: , Rfl:  .  docusate sodium (COLACE) 100 MG capsule, Take 100 mg by mouth 2 (two) times daily., Disp: , Rfl:  .  famotidine (PEPCID) 20 MG tablet, , Disp: , Rfl: 0 .  fluticasone (FLONASE) 50 MCG/ACT nasal spray, Place 1 spray into both nostrils daily as needed for allergies or rhinitis., Disp: , Rfl:  .  ibuprofen (ADVIL,MOTRIN) 200 MG tablet, Take 400 mg by mouth daily as needed for moderate pain., Disp: , Rfl:  .  L-Methylfolate 15 MG TABS, TAKE 1/2 TABLET (7.5 MG) BY MOUTH DAILY, Disp: 45 tablet, Rfl: 3 .  lidocaine (XYLOCAINE) 2 % jelly, Apply 1 application topically as needed. For pain, Disp: 30 mL, Rfl: 0 .  lidocaine-prilocaine (EMLA) cream, Apply to affected area once, Disp: 30 g, Rfl: 3 .  losartan (COZAAR) 50 MG tablet, TAKE 1 TABLET BY MOUTH DAILY, Disp: 90 tablet, Rfl: 3 .  montelukast (SINGULAIR) 10 MG tablet, Take 1 tablet (10 mg total) by mouth at bedtime., Disp: 30 tablet, Rfl: 12 .  ondansetron (ZOFRAN) 8 MG tablet, Take 1 tablet (8 mg total) by mouth 2 (two) times daily as needed., Disp: 30 tablet, Rfl: 2 .  Probiotic CAPS, Take 1 capsule by mouth at bedtime., Disp: , Rfl:  .  prochlorperazine (COMPAZINE)  10 MG tablet, Take 1 tablet (10 mg total) by mouth every 6 (six) hours as needed (Nausea or vomiting)., Disp: 60 tablet, Rfl: 2 .  tretinoin (RETIN-A) 0.1 % cream, Apply 1 application topically at bedtime., Disp: , Rfl:  .  dexamethasone (DECADRON) 4 MG tablet, Take 1 tablet (4  mg total) by mouth daily., Disp: 5 tablet, Rfl: 0 .  EPINEPHrine 0.3 mg/0.3 mL IJ SOAJ injection, Inject 0.3 mLs (0.3 mg total) into the muscle once. (Patient not taking: Reported on 03/08/2018), Disp: 1 Device, Rfl: 12  Physical exam:  Vitals:   03/08/18 1343  BP: 105/69  Pulse: 87  Resp: 16  Temp: 98 F (36.7 C)  TempSrc: Tympanic   Physical Exam Constitutional:      Comments: Thin built, fatigued appearing female. Accompanied. In recliner wrapped in blankets  HENT:     Head: Atraumatic.     Comments: Head scarf    Mouth/Throat:     Lips: Pink. No lesions.     Mouth: Mucous membranes are dry.     Pharynx: No pharyngeal swelling, oropharyngeal exudate or posterior oropharyngeal erythema.     Comments: Dry lips Eyes:     General: No scleral icterus.    Conjunctiva/sclera: Conjunctivae normal.  Neck:     Musculoskeletal: Neck supple. No muscular tenderness.  Cardiovascular:     Rate and Rhythm: Normal rate and regular rhythm.  Pulmonary:     Effort: Pulmonary effort is normal.     Breath sounds: Normal breath sounds.  Abdominal:     General: Abdomen is flat. There is no distension.     Palpations: Abdomen is soft.     Tenderness: There is no abdominal tenderness. There is no guarding.  Musculoskeletal:     Right lower leg: No edema.     Left lower leg: No edema.  Skin:    General: Skin is warm and dry.     Coloration: Skin is pale.     Findings: No lesion or rash.  Neurological:     Mental Status: She is oriented to person, place, and time.     Motor: No weakness.     Gait: Gait normal.  Psychiatric:        Mood and Affect: Mood is anxious.      CMP Latest Ref Rng & Units 03/07/2018  Glucose  70 - 99 mg/dL 104(H)  BUN 6 - 20 mg/dL 17  Creatinine 0.44 - 1.00 mg/dL 0.48  Sodium 135 - 145 mmol/L 134(L)  Potassium 3.5 - 5.1 mmol/L 4.2  Chloride 98 - 111 mmol/L 99  CO2 22 - 32 mmol/L 29  Calcium 8.9 - 10.3 mg/dL 9.6  Total Protein 6.5 - 8.1 g/dL 7.0  Total Bilirubin 0.3 - 1.2 mg/dL 0.8  Alkaline Phos 38 - 126 U/L 56  AST 15 - 41 U/L 38  ALT 0 - 44 U/L 44   CBC Latest Ref Rng & Units 03/07/2018  WBC 4.0 - 10.5 K/uL 2.1(L)  Hemoglobin 12.0 - 15.0 g/dL 8.8(L)  Hematocrit 36.0 - 46.0 % 25.9(L)  Platelets 150 - 400 K/uL 194    No images are attached to the encounter.  No results found.  Assessment and plan- Patient is a 48 y.o. female diagnosed with breast cancer who presents to symptom management clinic for malaise associated with chemotherapy.   1.  Clinical stage IIIb triple negative invasive carcinoma of the lower inner quadrant of left breast-currently s/p 4 cycles of neoadjuvant Adriamycin-Cytoxan with Udenyca support. Initiated cycle 5 of carbo-taxol. Currently, s/p c5d8 taxol only on 03/07/18. Tolerating fairly well with supportive care.   2. Chemotherapy induced malaise/fatigue- IV fluids and steroids given in clinic today. Encouraged continued increase in high protein, well balanced nutrition. Will send short course of decadron 62m PO daily x 5 days  given significant fatigue interfering with work. May also improve nausea symptoms.   3. Insomnia- likely secondary to anxiety. Currently taking xanax 0.25 mg at night for sleep. Discussed increasing to 0.5 mg for short course as steroids may also be contributing.   4. Headache- likely r/t underlying nausea, dehydration, and poor oral intake. Tylenol 1000 mg in clinic. The maximum daily dose of acetaminophen was discussed with the patient. Specifically discussed not to exceed 3,000 mg/3g of acetaminophen during a 24 hour period. Patient was instructed that acetaminophen/Tylenol is also found in many otc medications as well as pain  and/or cough medications. Patient expresses understanding.    5. Skin changes associated with chemotherapy- stable. Continue to monitor.   Follow-up with Shelby [name] as scheduled.  Patient advised to notify the clinic if there is no improvement in symptoms or if symptoms worsen.  Visit Diagnosis 1. Primary cancer of lower-inner quadrant of left female breast ([place])   2. Other fatigue   3. Anxiety associated with cancer diagnosis     Patient expressed understanding and was in agreement with this plan. She also understands that She can call clinic at any time with any questions, concerns, or complaints.   Thank you for allowing me to participate in the care of this very pleasant patient.   Beckey Rutter, DNP, AGNP-C [facility] [place] (work cell) [phone] (office)  CC: Shelby [name]

## 2018-03-08 NOTE — Progress Notes (Signed)
[facility]  Telephone:(336) [phone] Fax:(336) [phone]  ID: Shelby [name] OB: April 29, 1970  MR#: [id number]  MGN#:[id number]  Patient Care Team: Jerrol Banana., MD as PCP - General (Family Medicine) [name], MD as Consulting Physician (Cardiology)  CHIEF COMPLAINT: Clinical stage IIIB triple negative invasive carcinoma of the lower inner quadrant of the left breast.  INTERVAL HISTORY: Patient returns to clinic today for further evaluation and consideration of cycle 5, day 15 of carboplatinum and Taxol.  Taxol only today.  She continues to have chronic weakness and fatigue, but states this was significantly better after receiving IV fluids last week after her treatment. She has no neurologic complaints.  She has a good appetite and denies weight loss.  She has no chest pain or shortness of breath.  She denies any nausea, vomiting, constipation, or diarrhea.  She has no urinary complaints.  Patient offers no further specific complaints today.  REVIEW OF SYSTEMS:   Review of Systems  Constitutional: Negative.  Negative for fever, malaise/fatigue and weight loss.  HENT: Negative.  Negative for congestion, ear pain and sore throat.   Respiratory: Negative.  Negative for cough, hemoptysis and shortness of breath.   Cardiovascular: Negative.  Negative for chest pain and leg swelling.  Gastrointestinal: Negative.  Negative for abdominal pain and melena.  Genitourinary: Negative.  Negative for dysuria.  Musculoskeletal: Negative.  Negative for back pain.  Skin: Negative.  Negative for rash.  Neurological: Negative.  Negative for dizziness, focal weakness, weakness and headaches.  Psychiatric/Behavioral: The patient is nervous/anxious.     As per HPI. Otherwise, a complete review of systems is negative.  PAST MEDICAL HISTORY: Past Medical History:  Diagnosis Date  . Anemia    h/o with pregnancy  . Anxiety   . Asthma    allergy induced-no inhaler  .  Cancer ([place])   . Complication of anesthesia   . Environmental allergies   . Family history of adverse reaction to anesthesia    son-breathing problems-coded 1st time when he was 2 and had another surgery at 89 and had to be admitted for breathing problems  . Family history of breast cancer   . GERD (gastroesophageal reflux disease)   . Headache    migraines  . Hypertension   . Mitral valve disease   . Mitral valve prolapse   . Painful menstrual periods   . PONV (postoperative nausea and vomiting)   . Vertigo     PAST SURGICAL HISTORY: Past Surgical History:  Procedure Laterality Date  . ABDOMINAL HYSTERECTOMY  2010   supracervical   . BUNIONECTOMY Bilateral   . MANDIBLE RECONSTRUCTION     age 77-underbite  . PORTACATH PLACEMENT Right 12/31/2017   Procedure: INSERTION PORT-A-CATH;  Surgeon: Robert Bellow, MD;  Location: [facility] ORS;  Service: General;  Laterality: Right;  . SHOULDER ARTHROSCOPY WITH OPEN ROTATOR CUFF REPAIR Right 04/26/2017   Procedure: SHOULDER ARTHROSCOPY WITH OPEN ROTATOR CUFF REPAIR,SUBACROMINAL DECOMPRESSION;  Surgeon: Thornton Park, MD;  Location: [facility] ORS;  Service: Orthopedics;  Laterality: Right;  . TONSILLECTOMY      FAMILY HISTORY: Family History  Problem Relation Age of Onset  . Breast cancer Mother 33  . Diabetes Father   . Cancer Maternal Uncle        colon  . Breast cancer Maternal Grandmother 78    ADVANCED DIRECTIVES (Y/N):  N  HEALTH MAINTENANCE: Social History   Tobacco Use  . Smoking status: Former Smoker    Packs/day:  0.50    Years: 10.00    Pack years: 5.00    Types: Cigarettes    Last attempt to quit: 04/20/2007    Years since quitting: 10.9  . Smokeless tobacco: Never Used  . Tobacco comment: social smoker back then  Substance Use Topics  . Alcohol use: Yes    Comment: occas  . Drug use: No     Colonoscopy:  PAP:  Bone density:  Lipid panel:  Allergies  Allergen Reactions  . Hydrocodone-Acetaminophen Hives     swollen face  . Iodine Hives  . Peanut Butter Flavor Other (See Comments)    Made mouth tingle  . Shellfish Allergy Hives    Current Outpatient Medications  Medication Sig Dispense Refill  . ALPRAZolam (XANAX) 0.25 MG tablet Take 1 tablet (0.25 mg total) by mouth 2 (two) times daily as needed for anxiety. 60 tablet 0  . Ascorbic Acid (VITAMIN C PO) Take 1 tablet by mouth daily.     . Cholecalciferol (VITAMIN D) 50 MCG (2000 UT) CAPS Take 2,000 Units by mouth daily.    Marland Kitchen dexamethasone (DECADRON) 4 MG tablet Take 1 tablet (4 mg total) by mouth daily. 5 tablet 0  . docusate sodium (COLACE) 100 MG capsule Take 100 mg by mouth 2 (two) times daily.    Marland Kitchen EPINEPHrine 0.3 mg/0.3 mL IJ SOAJ injection Inject 0.3 mLs (0.3 mg total) into the muscle once. 1 Device 12  . famotidine (PEPCID) 20 MG tablet   0  . fluticasone (FLONASE) 50 MCG/ACT nasal spray Place 1 spray into both nostrils daily as needed for allergies or rhinitis.    Marland Kitchen ibuprofen (ADVIL,MOTRIN) 200 MG tablet Take 400 mg by mouth daily as needed for moderate pain.    Marland Kitchen L-Methylfolate 15 MG TABS TAKE 1/2 TABLET (7.5 MG) BY MOUTH DAILY 45 tablet 3  . lidocaine (XYLOCAINE) 2 % jelly Apply 1 application topically as needed. For pain 30 mL 0  . lidocaine-prilocaine (EMLA) cream Apply to affected area once 30 g 3  . losartan (COZAAR) 50 MG tablet TAKE 1 TABLET BY MOUTH DAILY 90 tablet 3  . montelukast (SINGULAIR) 10 MG tablet Take 1 tablet (10 mg total) by mouth at bedtime. 30 tablet 12  . ondansetron (ZOFRAN) 8 MG tablet Take 1 tablet (8 mg total) by mouth 2 (two) times daily as needed. 30 tablet 2  . Probiotic CAPS Take 1 capsule by mouth at bedtime.    . prochlorperazine (COMPAZINE) 10 MG tablet Take 1 tablet (10 mg total) by mouth every 6 (six) hours as needed (Nausea or vomiting). 60 tablet 2  . tretinoin (RETIN-A) 0.1 % cream Apply 1 application topically at bedtime.     No current facility-administered medications for this visit.     Facility-Administered Medications Ordered in Other Visits  Medication Dose Route Frequency Provider Last Rate Last Dose  . 0.9 %  sodium chloride infusion   Intravenous Continuous Lloyd Huger, MD 999 mL/hr at 03/15/18 1043      OBJECTIVE: Vitals:   03/14/18 1053  BP: 134/86  Pulse: (!) 116  Temp: (!) 97.5 F (36.4 C)     Body mass index is 22.73 kg/m.    ECOG FS:0 - Asymptomatic  General: Well-developed, well-nourished, no acute distress. Eyes: Pink conjunctiva, anicteric sclera. HEENT: Normocephalic, moist mucous membranes. Breast: Exam deferred today. Lungs: Clear to auscultation bilaterally. Heart: Regular rate and rhythm. No rubs, murmurs, or gallops. Abdomen: Soft, nontender, nondistended. No organomegaly noted, normoactive bowel  sounds. Musculoskeletal: No edema, cyanosis, or clubbing. Neuro: Alert, answering all questions appropriately. Cranial nerves grossly intact. Skin: No rashes or petechiae noted. Psych: Normal affect.  LAB RESULTS:  Lab Results  Component Value Date   NA 135 03/14/2018   K 3.8 03/14/2018   CL 101 03/14/2018   CO2 29 03/14/2018   GLUCOSE 121 (H) 03/14/2018   BUN 14 03/14/2018   CREATININE 0.38 (L) 03/14/2018   CALCIUM 9.0 03/14/2018   PROT 6.6 03/14/2018   ALBUMIN 3.9 03/14/2018   AST 78 (H) 03/14/2018   ALT 120 (H) 03/14/2018   ALKPHOS 52 03/14/2018   BILITOT 0.4 03/14/2018   GFRNONAA >60 03/14/2018   GFRAA >60 03/14/2018    Lab Results  Component Value Date   WBC 2.8 (L) 03/14/2018   NEUTROABS 1.6 (L) 03/14/2018   HGB 7.7 (L) 03/14/2018   HCT 23.2 (L) 03/14/2018   MCV 92.8 03/14/2018   PLT 143 (L) 03/14/2018     STUDIES: No results found.  ASSESSMENT: Clinical stage IIIB triple negative invasive carcinoma of the lower inner quadrant of the left breast.  PLAN:    1. Clinical stage IIIB triple negative invasive carcinoma of the lower inner quadrant of the left breast: CT scan results from January 01, 2018 did  not reveal any metastatic disease.  Given patient's stage of disease, she will benefit from neoadjuvant chemotherapy using Adriamycin, Cytoxan with Udenyca support followed by carboplatinum and Taxol.  She has now completed 4 cycles of Adriamycin and Cytoxan.  Patient has indicated she may prefer to undergo mastectomy, therefore adjuvant XRT may not be needed.  An aromatase inhibitor would not offer any benefit given the triple negative status of her disease.  Also discussed today with the possibility of using capecitabine adjuvantly if patient does not have a complete pathologic response.  MUGA scan on January 01, 2018 revealed an EF of 71%.  Proceed with cycle 5, day 15 of carboplatin and Taxol.  Taxol only today.  Return to clinic in 1 week for further evaluation and consideration of cycle 6, day 1 which is both carboplatinum and Taxol.   2.  Anxiety: Chronic.  Continue Xanax as needed. 3.  Anemia: Patient's hemoglobin continues to trend down and is now 7.7.  Monitor. 4.  Headache: Patient does not complain of this today.  Continue ibuprofen sparingly as needed. 5.  Neutropenia: ANC is 1.6 today.  Proceed with treatment cautiously as above. 6.  Weakness and fatigue: Return to clinic tomorrow for IV fluids.  Patient expressed understanding and was in agreement with this plan. She also understands that She can call clinic at any time with any questions, concerns, or complaints.   Cancer Staging Primary cancer of lower-inner quadrant of left female breast [facility]) Staging form: Breast, AJCC 8th Edition - Clinical stage from 12/24/2017: Stage IIIB (cT2, cN1, cM0, G3, ER-, PR-, HER2-) - Signed by Lloyd Huger, MD on 12/24/2017 - Pathologic: No stage assigned - Unsigned   Lloyd Huger, MD   03/15/2018 11:55 AM

## 2018-03-11 ENCOUNTER — Encounter: Payer: Self-pay | Admitting: Nurse Practitioner

## 2018-03-11 ENCOUNTER — Inpatient Hospital Stay: Payer: No Typology Code available for payment source

## 2018-03-14 ENCOUNTER — Inpatient Hospital Stay: Payer: No Typology Code available for payment source

## 2018-03-14 ENCOUNTER — Inpatient Hospital Stay (HOSPITAL_BASED_OUTPATIENT_CLINIC_OR_DEPARTMENT_OTHER): Payer: No Typology Code available for payment source | Admitting: Oncology

## 2018-03-14 ENCOUNTER — Other Ambulatory Visit: Payer: Self-pay

## 2018-03-14 VITALS — HR 98

## 2018-03-14 VITALS — BP 134/86 | HR 116 | Temp 97.5°F | Ht 66.0 in | Wt 140.8 lb

## 2018-03-14 DIAGNOSIS — C50312 Malignant neoplasm of lower-inner quadrant of left female breast: Secondary | ICD-10-CM

## 2018-03-14 DIAGNOSIS — Z171 Estrogen receptor negative status [ER-]: Secondary | ICD-10-CM | POA: Diagnosis not present

## 2018-03-14 DIAGNOSIS — D709 Neutropenia, unspecified: Secondary | ICD-10-CM

## 2018-03-14 DIAGNOSIS — R5383 Other fatigue: Secondary | ICD-10-CM

## 2018-03-14 DIAGNOSIS — Z5111 Encounter for antineoplastic chemotherapy: Secondary | ICD-10-CM | POA: Diagnosis not present

## 2018-03-14 DIAGNOSIS — F419 Anxiety disorder, unspecified: Secondary | ICD-10-CM

## 2018-03-14 DIAGNOSIS — Z87891 Personal history of nicotine dependence: Secondary | ICD-10-CM

## 2018-03-14 DIAGNOSIS — D649 Anemia, unspecified: Secondary | ICD-10-CM | POA: Diagnosis not present

## 2018-03-14 DIAGNOSIS — R531 Weakness: Secondary | ICD-10-CM

## 2018-03-14 LAB — CBC WITH DIFFERENTIAL/PLATELET
Abs Immature Granulocytes: 0.01 10*3/uL (ref 0.00–0.07)
Basophils Absolute: 0 10*3/uL (ref 0.0–0.1)
Basophils Relative: 1 %
Eosinophils Absolute: 0 10*3/uL (ref 0.0–0.5)
Eosinophils Relative: 1 %
HCT: 23.2 % — ABNORMAL LOW (ref 36.0–46.0)
Hemoglobin: 7.7 g/dL — ABNORMAL LOW (ref 12.0–15.0)
IMMATURE GRANULOCYTES: 0 %
Lymphocytes Relative: 29 %
Lymphs Abs: 0.8 10*3/uL (ref 0.7–4.0)
MCH: 30.8 pg (ref 26.0–34.0)
MCHC: 33.2 g/dL (ref 30.0–36.0)
MCV: 92.8 fL (ref 80.0–100.0)
Monocytes Absolute: 0.3 10*3/uL (ref 0.1–1.0)
Monocytes Relative: 11 %
NEUTROS PCT: 58 %
Neutro Abs: 1.6 10*3/uL — ABNORMAL LOW (ref 1.7–7.7)
Platelets: 143 10*3/uL — ABNORMAL LOW (ref 150–400)
RBC: 2.5 MIL/uL — ABNORMAL LOW (ref 3.87–5.11)
RDW: 16.5 % — ABNORMAL HIGH (ref 11.5–15.5)
WBC: 2.8 10*3/uL — ABNORMAL LOW (ref 4.0–10.5)
nRBC: 0 % (ref 0.0–0.2)

## 2018-03-14 LAB — COMPREHENSIVE METABOLIC PANEL
ALT: 120 U/L — ABNORMAL HIGH (ref 0–44)
ANION GAP: 5 (ref 5–15)
AST: 78 U/L — ABNORMAL HIGH (ref 15–41)
Albumin: 3.9 g/dL (ref 3.5–5.0)
Alkaline Phosphatase: 52 U/L (ref 38–126)
BUN: 14 mg/dL (ref 6–20)
CO2: 29 mmol/L (ref 22–32)
Calcium: 9 mg/dL (ref 8.9–10.3)
Chloride: 101 mmol/L (ref 98–111)
Creatinine, Ser: 0.38 mg/dL — ABNORMAL LOW (ref 0.44–1.00)
GFR calc Af Amer: >60 mL/min (ref >60)
GFR calc non Af Amer: >60 mL/min (ref >60)
Glucose, Bld: 121 mg/dL — ABNORMAL HIGH (ref 70–99)
Potassium: 3.8 mmol/L (ref 3.5–5.1)
Sodium: 135 mmol/L (ref 135–145)
Total Bilirubin: 0.4 mg/dL (ref 0.3–1.2)
Total Protein: 6.6 g/dL (ref 6.5–8.1)

## 2018-03-14 MED ORDER — SODIUM CHLORIDE 0.9 % IV SOLN
Freq: Once | INTRAVENOUS | Status: AC
  Administered 2018-03-14: 12:00:00 via INTRAVENOUS
  Filled 2018-03-14: qty 250

## 2018-03-14 MED ORDER — DEXAMETHASONE SODIUM PHOSPHATE 10 MG/ML IJ SOLN
10.0000 mg | Freq: Once | INTRAMUSCULAR | Status: AC
  Administered 2018-03-14: 10 mg via INTRAVENOUS
  Filled 2018-03-14: qty 1

## 2018-03-14 MED ORDER — SODIUM CHLORIDE 0.9 % IV SOLN
80.0000 mg/m2 | Freq: Once | INTRAVENOUS | Status: AC
  Administered 2018-03-14: 138 mg via INTRAVENOUS
  Filled 2018-03-14: qty 23

## 2018-03-14 MED ORDER — HEPARIN SOD (PORK) LOCK FLUSH 100 UNIT/ML IV SOLN
500.0000 [IU] | Freq: Once | INTRAVENOUS | Status: AC
  Administered 2018-03-14: 500 [IU] via INTRAVENOUS

## 2018-03-14 MED ORDER — SODIUM CHLORIDE 0.9% FLUSH
10.0000 mL | Freq: Once | INTRAVENOUS | Status: AC
  Administered 2018-03-14: 10 mL via INTRAVENOUS
  Filled 2018-03-14: qty 10

## 2018-03-14 MED ORDER — FAMOTIDINE IN NACL 20-0.9 MG/50ML-% IV SOLN
20.0000 mg | Freq: Once | INTRAVENOUS | Status: AC
  Administered 2018-03-14: 20 mg via INTRAVENOUS
  Filled 2018-03-14: qty 50

## 2018-03-14 MED ORDER — DIPHENHYDRAMINE HCL 50 MG/ML IJ SOLN
25.0000 mg | Freq: Once | INTRAMUSCULAR | Status: AC
  Administered 2018-03-14: 25 mg via INTRAVENOUS
  Filled 2018-03-14: qty 1

## 2018-03-14 NOTE — Progress Notes (Signed)
Patient is here today to follow up on her primary cancer of lower inner quadrant of left breast. Patient stated that she would like to speak to the provider about some concerns.

## 2018-03-14 NOTE — Progress Notes (Signed)
HR 116, ALT 120.  Ok to proceed per MD

## 2018-03-15 ENCOUNTER — Inpatient Hospital Stay: Payer: No Typology Code available for payment source

## 2018-03-15 VITALS — BP 133/78 | HR 82 | Temp 97.6°F | Resp 16

## 2018-03-15 DIAGNOSIS — Z5111 Encounter for antineoplastic chemotherapy: Secondary | ICD-10-CM | POA: Diagnosis not present

## 2018-03-15 DIAGNOSIS — C50312 Malignant neoplasm of lower-inner quadrant of left female breast: Secondary | ICD-10-CM

## 2018-03-15 MED ORDER — SODIUM CHLORIDE 0.9 % IV SOLN
Freq: Once | INTRAVENOUS | Status: AC
  Administered 2018-03-15: 1000 mL via INTRAVENOUS
  Filled 2018-03-15: qty 250

## 2018-03-15 MED ORDER — HEPARIN SOD (PORK) LOCK FLUSH 100 UNIT/ML IV SOLN
500.0000 [IU] | Freq: Once | INTRAVENOUS | Status: AC
  Administered 2018-03-15: 500 [IU] via INTRAVENOUS

## 2018-03-15 MED ORDER — SODIUM CHLORIDE 0.9 % IV SOLN
INTRAVENOUS | Status: DC
  Administered 2018-03-15: 11:00:00 via INTRAVENOUS
  Filled 2018-03-15 (×2): qty 250

## 2018-03-15 MED ORDER — SODIUM CHLORIDE 0.9% FLUSH
10.0000 mL | Freq: Once | INTRAVENOUS | Status: AC
  Administered 2018-03-15: 10 mL via INTRAVENOUS
  Filled 2018-03-15: qty 10

## 2018-03-17 NOTE — Progress Notes (Signed)
[place]  [phone] Fax:([phone]  ID: Shelby [name] OB: 1970-06-15  MR#: [id number]  UXN#:[id number]  Patient Care Team: Jerrol Banana., MD as PCP - General (Family Medicine) [name], MD as Consulting Physician (Cardiology)  CHIEF COMPLAINT: Clinical stage IIIB triple negative invasive carcinoma of the lower inner quadrant of the left breast.  INTERVAL HISTORY: Patient returns to clinic today for further evaluation and consideration of cycle 6, day 1 of carboplatinum and Taxol.  She is tolerating her treatments relatively well.  She continues to have chronic weakness and fatigue.  She denies any recent fevers or illnesses.  She has no neurologic complaints.  She has a good appetite and denies weight loss.  She has no chest pain or shortness of breath.  She denies any nausea, vomiting, constipation, or diarrhea.  She has no urinary complaints.  Patient offers no further specific complaints today.  REVIEW OF SYSTEMS:   Review of Systems  Constitutional: Positive for malaise/fatigue. Negative for fever and weight loss.  HENT: Negative.  Negative for congestion, ear pain and sore throat.   Respiratory: Negative.  Negative for cough, hemoptysis and shortness of breath.   Cardiovascular: Negative.  Negative for chest pain and leg swelling.  Gastrointestinal: Negative.  Negative for abdominal pain and melena.  Genitourinary: Negative.  Negative for dysuria.  Musculoskeletal: Negative.  Negative for back pain.  Skin: Negative.  Negative for rash.  Neurological: Positive for weakness. Negative for dizziness, focal weakness and headaches.  Psychiatric/Behavioral: The patient is nervous/anxious.     As per HPI. Otherwise, a complete review of systems is negative.  PAST MEDICAL HISTORY: Past Medical History:  Diagnosis Date  . Anemia    h/o with pregnancy  . Anxiety   . Asthma    allergy induced-no inhaler  . Cancer ([place])   .  Complication of anesthesia   . Environmental allergies   . Family history of adverse reaction to anesthesia    son-breathing problems-coded 1st time when he was 2 and had another surgery at 27 and had to be admitted for breathing problems  . Family history of breast cancer   . GERD (gastroesophageal reflux disease)   . Headache    migraines  . Hypertension   . Mitral valve disease   . Mitral valve prolapse   . Painful menstrual periods   . PONV (postoperative nausea and vomiting)   . Vertigo     PAST SURGICAL HISTORY: Past Surgical History:  Procedure Laterality Date  . ABDOMINAL HYSTERECTOMY  2010   supracervical   . BUNIONECTOMY Bilateral   . MANDIBLE RECONSTRUCTION     age 89-underbite  . PORTACATH PLACEMENT Right 12/31/2017   Procedure: INSERTION PORT-A-CATH;  Surgeon: Robert Bellow, MD;  Location: [facility] ORS;  Service: General;  Laterality: Right;  . SHOULDER ARTHROSCOPY WITH OPEN ROTATOR CUFF REPAIR Right 04/26/2017   Procedure: SHOULDER ARTHROSCOPY WITH OPEN ROTATOR CUFF REPAIR,SUBACROMINAL DECOMPRESSION;  Surgeon: Thornton Park, MD;  Location: [facility] ORS;  Service: Orthopedics;  Laterality: Right;  . TONSILLECTOMY      FAMILY HISTORY: Family History  Problem Relation Age of Onset  . Breast cancer Mother 70  . Diabetes Father   . Cancer Maternal Uncle        colon  . Breast cancer Maternal Grandmother 60    ADVANCED DIRECTIVES (Y/N):  N  HEALTH MAINTENANCE: Social History   Tobacco Use  . Smoking status: Former Smoker    Packs/day: 0.50  Years: 10.00    Pack years: 5.00    Types: Cigarettes    Last attempt to quit: 04/20/2007    Years since quitting: 10.9  . Smokeless tobacco: Never Used  . Tobacco comment: social smoker back then  Substance Use Topics  . Alcohol use: Yes    Comment: occas  . Drug use: No     Colonoscopy:  PAP:  Bone density:  Lipid panel:  Allergies  Allergen Reactions  . Hydrocodone-Acetaminophen Hives    swollen face    . Iodine Hives  . Peanut Butter Flavor Other (See Comments)    Made mouth tingle  . Shellfish Allergy Hives    Current Outpatient Medications  Medication Sig Dispense Refill  . ALPRAZolam (XANAX) 0.25 MG tablet Take 1 tablet (0.25 mg total) by mouth 2 (two) times daily as needed for anxiety. 60 tablet 0  . Ascorbic Acid (VITAMIN C PO) Take 1 tablet by mouth daily.     . Cholecalciferol (VITAMIN D) 50 MCG (2000 UT) CAPS Take 2,000 Units by mouth daily.    Marland Kitchen dexamethasone (DECADRON) 4 MG tablet Take 1 tablet (4 mg total) by mouth daily. 5 tablet 0  . docusate sodium (COLACE) 100 MG capsule Take 100 mg by mouth 2 (two) times daily.    Marland Kitchen EPINEPHrine 0.3 mg/0.3 mL IJ SOAJ injection Inject 0.3 mLs (0.3 mg total) into the muscle once. 1 Device 12  . famotidine (PEPCID) 20 MG tablet   0  . fluticasone (FLONASE) 50 MCG/ACT nasal spray Place 1 spray into both nostrils daily as needed for allergies or rhinitis.    Marland Kitchen ibuprofen (ADVIL,MOTRIN) 200 MG tablet Take 400 mg by mouth daily as needed for moderate pain.    Marland Kitchen L-Methylfolate 15 MG TABS TAKE 1/2 TABLET (7.5 MG) BY MOUTH DAILY 45 tablet 3  . lidocaine (XYLOCAINE) 2 % jelly Apply 1 application topically as needed. For pain 30 mL 0  . lidocaine-prilocaine (EMLA) cream Apply to affected area once 30 g 3  . losartan (COZAAR) 50 MG tablet TAKE 1 TABLET BY MOUTH DAILY 90 tablet 3  . montelukast (SINGULAIR) 10 MG tablet Take 1 tablet (10 mg total) by mouth at bedtime. 30 tablet 12  . ondansetron (ZOFRAN) 8 MG tablet Take 1 tablet (8 mg total) by mouth 2 (two) times daily as needed. 30 tablet 2  . Probiotic CAPS Take 1 capsule by mouth at bedtime.    . prochlorperazine (COMPAZINE) 10 MG tablet Take 1 tablet (10 mg total) by mouth every 6 (six) hours as needed (Nausea or vomiting). 60 tablet 2  . tretinoin (RETIN-A) 0.1 % cream Apply 1 application topically at bedtime.     No current facility-administered medications for this visit.     Facility-Administered Medications Ordered in Other Visits  Medication Dose Route Frequency Provider Last Rate Last Dose  . CARBOplatin (PARAPLATIN) 680 mg in sodium chloride 0.9 % 250 mL chemo infusion  680 mg Intravenous Once Lloyd Huger, MD      . dexamethasone (DECADRON) injection 10 mg  10 mg Intravenous Once Lloyd Huger, MD      . fosaprepitant (EMEND) 150 mg, dexamethasone (DECADRON) 12 mg in sodium chloride 0.9 % 145 mL IVPB   Intravenous Once Lloyd Huger, MD      . heparin lock flush 100 unit/mL  500 Units Intravenous Once Lloyd Huger, MD      . PACLitaxel (TAXOL) 138 mg in sodium chloride 0.9 % 250 mL  chemo infusion (</= 35m/m2)  80 mg/m2 (Treatment Plan Recorded) Intravenous Once FLloyd Huger MD        OBJECTIVE: Vitals:   03/21/18 0925  BP: 131/87  Pulse: 98  Temp: 98.7 F (37.1 C)     Body mass index is 23.03 kg/m.    ECOG FS:0 - Asymptomatic  General: Well-developed, well-nourished, no acute distress. Eyes: Pink conjunctiva, anicteric sclera. HEENT: Normocephalic, moist mucous membranes. Breast: Exam deferred today. Lungs: Clear to auscultation bilaterally. Heart: Regular rate and rhythm. No rubs, murmurs, or gallops. Abdomen: Soft, nontender, nondistended. No organomegaly noted, normoactive bowel sounds. Musculoskeletal: No edema, cyanosis, or clubbing. Neuro: Alert, answering all questions appropriately. Cranial nerves grossly intact. Skin: No rashes or petechiae noted. Psych: Normal affect.   LAB RESULTS:  Lab Results  Component Value Date   NA 135 03/21/2018   K 3.9 03/21/2018   CL 102 03/21/2018   CO2 26 03/21/2018   GLUCOSE 100 (H) 03/21/2018   BUN 15 03/21/2018   CREATININE 0.49 03/21/2018   CALCIUM 9.3 03/21/2018   PROT 6.7 03/21/2018   ALBUMIN 4.2 03/21/2018   AST 74 (H) 03/21/2018   ALT 154 (H) 03/21/2018   ALKPHOS 55 03/21/2018   BILITOT 0.5 03/21/2018   GFRNONAA >60 03/21/2018   GFRAA >60  03/21/2018    Lab Results  Component Value Date   WBC 2.3 (L) 03/21/2018   NEUTROABS 1.3 (L) 03/21/2018   HGB 8.6 (L) 03/21/2018   HCT 25.3 (L) 03/21/2018   MCV 95.1 03/21/2018   PLT 205 03/21/2018     STUDIES: No results found.  ASSESSMENT: Clinical stage IIIB triple negative invasive carcinoma of the lower inner quadrant of the left breast.  PLAN:    1. Clinical stage IIIB triple negative invasive carcinoma of the lower inner quadrant of the left breast: CT scan results from January 01, 2018 did not reveal any metastatic disease.  Given patient's stage of disease, she will benefit from neoadjuvant chemotherapy using Adriamycin, Cytoxan with Udenyca support followed by carboplatinum and Taxol.  She has now completed 4 cycles of Adriamycin and Cytoxan.  Patient has indicated she may prefer to undergo mastectomy, therefore adjuvant XRT may not be needed.  An aromatase inhibitor would not offer any benefit given the triple negative status of her disease.  Also discussed today with the possibility of using capecitabine adjuvantly if patient does not have a complete pathologic response.  MUGA scan on January 01, 2018 revealed an EF of 71%.  Proceed with cycle 6, day 1 of carboplatinum and Taxol today.  Return to clinic tomorrow for IV fluids and Zarxio.  Patient will then return to clinic in 1 week for further evaluation and consideration of cycle 6, day 8 which is Taxol only.  2.  Anxiety: Chronic.  Continue Xanax as needed. 3.  Anemia: Patient's hemoglobin has improved and is now 8.6.  Monitor. 4.  Headache: Patient does not complain of this today.  Continue ibuprofen sparingly as needed. 5.  Neutropenia: ANC is 1.3 today.  Proceed with treatment as above and return to clinic tomorrow [place].  Weakness and fatigue: Chronic and unchanged.  IV fluids as above.  Patient expressed understanding and was in agreement with this plan. She also understands that She can call clinic at any time  with any questions, concerns, or complaints.   Cancer Staging Primary cancer of lower-inner quadrant of left female breast ([facility] Staging form: Breast, AJCC 8th Edition - Clinical stage from 12/24/2017:  Stage IIIB (cT2, cN1, cM0, G3, ER-, PR-, HER2-) - Signed by Lloyd Huger, MD on 12/24/2017 - Pathologic: No stage assigned - Unsigned   Lloyd Huger, MD   03/21/2018 10:58 AM

## 2018-03-21 ENCOUNTER — Inpatient Hospital Stay: Payer: No Typology Code available for payment source

## 2018-03-21 ENCOUNTER — Inpatient Hospital Stay (HOSPITAL_BASED_OUTPATIENT_CLINIC_OR_DEPARTMENT_OTHER): Payer: No Typology Code available for payment source | Admitting: Oncology

## 2018-03-21 ENCOUNTER — Other Ambulatory Visit: Payer: Self-pay | Admitting: *Deleted

## 2018-03-21 ENCOUNTER — Other Ambulatory Visit: Payer: Self-pay

## 2018-03-21 VITALS — BP 131/87 | HR 98 | Temp 98.7°F | Ht 66.0 in | Wt 142.7 lb

## 2018-03-21 DIAGNOSIS — Z171 Estrogen receptor negative status [ER-]: Secondary | ICD-10-CM

## 2018-03-21 DIAGNOSIS — C50312 Malignant neoplasm of lower-inner quadrant of left female breast: Secondary | ICD-10-CM

## 2018-03-21 DIAGNOSIS — Z87891 Personal history of nicotine dependence: Secondary | ICD-10-CM

## 2018-03-21 DIAGNOSIS — F419 Anxiety disorder, unspecified: Secondary | ICD-10-CM

## 2018-03-21 DIAGNOSIS — R5382 Chronic fatigue, unspecified: Secondary | ICD-10-CM

## 2018-03-21 DIAGNOSIS — R531 Weakness: Secondary | ICD-10-CM

## 2018-03-21 DIAGNOSIS — D702 Other drug-induced agranulocytosis: Secondary | ICD-10-CM

## 2018-03-21 DIAGNOSIS — Z5111 Encounter for antineoplastic chemotherapy: Secondary | ICD-10-CM | POA: Diagnosis not present

## 2018-03-21 DIAGNOSIS — D709 Neutropenia, unspecified: Secondary | ICD-10-CM | POA: Diagnosis not present

## 2018-03-21 DIAGNOSIS — D649 Anemia, unspecified: Secondary | ICD-10-CM | POA: Diagnosis not present

## 2018-03-21 LAB — CBC WITH DIFFERENTIAL/PLATELET
Abs Immature Granulocytes: 0.01 10*3/uL (ref 0.00–0.07)
Basophils Absolute: 0 10*3/uL (ref 0.0–0.1)
Basophils Relative: 0 %
Eosinophils Absolute: 0 10*3/uL (ref 0.0–0.5)
Eosinophils Relative: 1 %
HCT: 25.3 % — ABNORMAL LOW (ref 36.0–46.0)
HEMOGLOBIN: 8.6 g/dL — AB (ref 12.0–15.0)
Immature Granulocytes: 0 %
Lymphocytes Relative: 34 %
Lymphs Abs: 0.8 10*3/uL (ref 0.7–4.0)
MCH: 32.3 pg (ref 26.0–34.0)
MCHC: 34 g/dL (ref 30.0–36.0)
MCV: 95.1 fL (ref 80.0–100.0)
Monocytes Absolute: 0.2 10*3/uL (ref 0.1–1.0)
Monocytes Relative: 7 %
Neutro Abs: 1.3 10*3/uL — ABNORMAL LOW (ref 1.7–7.7)
Neutrophils Relative %: 58 %
Platelets: 205 10*3/uL (ref 150–400)
RBC: 2.66 MIL/uL — ABNORMAL LOW (ref 3.87–5.11)
RDW: 18.1 % — ABNORMAL HIGH (ref 11.5–15.5)
WBC: 2.3 10*3/uL — ABNORMAL LOW (ref 4.0–10.5)
nRBC: 0 % (ref 0.0–0.2)

## 2018-03-21 LAB — COMPREHENSIVE METABOLIC PANEL
ALT: 154 U/L — ABNORMAL HIGH (ref 0–44)
AST: 74 U/L — ABNORMAL HIGH (ref 15–41)
Albumin: 4.2 g/dL (ref 3.5–5.0)
Alkaline Phosphatase: 55 U/L (ref 38–126)
Anion gap: 7 (ref 5–15)
BUN: 15 mg/dL (ref 6–20)
CO2: 26 mmol/L (ref 22–32)
Calcium: 9.3 mg/dL (ref 8.9–10.3)
Chloride: 102 mmol/L (ref 98–111)
Creatinine, Ser: 0.49 mg/dL (ref 0.44–1.00)
Glucose, Bld: 100 mg/dL — ABNORMAL HIGH (ref 70–99)
Potassium: 3.9 mmol/L (ref 3.5–5.1)
Sodium: 135 mmol/L (ref 135–145)
Total Bilirubin: 0.5 mg/dL (ref 0.3–1.2)
Total Protein: 6.7 g/dL (ref 6.5–8.1)

## 2018-03-21 LAB — SAMPLE TO BLOOD BANK

## 2018-03-21 MED ORDER — SODIUM CHLORIDE 0.9 % IV SOLN
80.0000 mg/m2 | Freq: Once | INTRAVENOUS | Status: AC
  Administered 2018-03-21: 138 mg via INTRAVENOUS
  Filled 2018-03-21: qty 23

## 2018-03-21 MED ORDER — SODIUM CHLORIDE 0.9 % IV SOLN
Freq: Once | INTRAVENOUS | Status: AC
  Administered 2018-03-21: 10:00:00 via INTRAVENOUS
  Filled 2018-03-21: qty 250

## 2018-03-21 MED ORDER — HEPARIN SOD (PORK) LOCK FLUSH 100 UNIT/ML IV SOLN
500.0000 [IU] | Freq: Once | INTRAVENOUS | Status: AC
  Administered 2018-03-21: 500 [IU] via INTRAVENOUS
  Filled 2018-03-21: qty 5

## 2018-03-21 MED ORDER — SODIUM CHLORIDE 0.9 % IV SOLN
680.0000 mg | Freq: Once | INTRAVENOUS | Status: AC
  Administered 2018-03-21: 680 mg via INTRAVENOUS
  Filled 2018-03-21: qty 68

## 2018-03-21 MED ORDER — FAMOTIDINE IN NACL 20-0.9 MG/50ML-% IV SOLN
20.0000 mg | Freq: Once | INTRAVENOUS | Status: AC
  Administered 2018-03-21: 20 mg via INTRAVENOUS
  Filled 2018-03-21: qty 50

## 2018-03-21 MED ORDER — ALPRAZOLAM 0.25 MG PO TABS
0.2500 mg | ORAL_TABLET | Freq: Once | ORAL | Status: AC
  Administered 2018-03-21: 0.25 mg via ORAL
  Filled 2018-03-21: qty 1

## 2018-03-21 MED ORDER — PALONOSETRON HCL INJECTION 0.25 MG/5ML
0.2500 mg | Freq: Once | INTRAVENOUS | Status: AC
  Administered 2018-03-21: 0.25 mg via INTRAVENOUS
  Filled 2018-03-21: qty 5

## 2018-03-21 MED ORDER — SODIUM CHLORIDE 0.9% FLUSH
10.0000 mL | Freq: Once | INTRAVENOUS | Status: AC
  Administered 2018-03-21: 10 mL via INTRAVENOUS
  Filled 2018-03-21: qty 10

## 2018-03-21 MED ORDER — DIPHENHYDRAMINE HCL 50 MG/ML IJ SOLN
25.0000 mg | Freq: Once | INTRAMUSCULAR | Status: AC
  Administered 2018-03-21: 25 mg via INTRAVENOUS
  Filled 2018-03-21: qty 1

## 2018-03-21 MED ORDER — DEXAMETHASONE SODIUM PHOSPHATE 10 MG/ML IJ SOLN: 10.0000 mg | Freq: Once | INTRAMUSCULAR | Status: DC

## 2018-03-21 MED ORDER — SODIUM CHLORIDE 0.9 % IV SOLN
Freq: Once | INTRAVENOUS | Status: AC
  Administered 2018-03-21: 11:00:00 via INTRAVENOUS
  Filled 2018-03-21: qty 5

## 2018-03-21 NOTE — Progress Notes (Signed)
Patient is here today to follow up on her left breast cancer. Patient stated that she had been doing well. However, she stated that she has pain on her left breast pain after chemotherapy for a couple of days.

## 2018-03-22 ENCOUNTER — Inpatient Hospital Stay: Payer: No Typology Code available for payment source

## 2018-03-22 DIAGNOSIS — Z5111 Encounter for antineoplastic chemotherapy: Secondary | ICD-10-CM | POA: Diagnosis not present

## 2018-03-22 DIAGNOSIS — D702 Other drug-induced agranulocytosis: Secondary | ICD-10-CM

## 2018-03-22 MED ORDER — SODIUM CHLORIDE 0.9 % IV SOLN
Freq: Once | INTRAVENOUS | Status: AC
  Administered 2018-03-22: 14:00:00 via INTRAVENOUS
  Filled 2018-03-22: qty 250

## 2018-03-22 MED ORDER — SODIUM CHLORIDE 0.9% FLUSH
10.0000 mL | Freq: Once | INTRAVENOUS | Status: AC | PRN
  Administered 2018-03-22: 10 mL
  Filled 2018-03-22: qty 10

## 2018-03-22 MED ORDER — HEPARIN SOD (PORK) LOCK FLUSH 100 UNIT/ML IV SOLN
500.0000 [IU] | Freq: Once | INTRAVENOUS | Status: AC | PRN
  Administered 2018-03-22: 500 [IU]

## 2018-03-22 MED ORDER — FILGRASTIM-SNDZ 480 MCG/0.8ML IJ SOSY
480.0000 ug | PREFILLED_SYRINGE | Freq: Once | INTRAMUSCULAR | Status: AC
  Administered 2018-03-22: 480 ug via SUBCUTANEOUS
  Filled 2018-03-22: qty 0.8

## 2018-03-24 NOTE — Progress Notes (Signed)
[place]  [phone]) [phone] Fax:(336) [phone]  ID: Shelby [name] OB: Mar 26, 1970  MR#: [id number]  KGS#:[id number]  Patient Care Team: Jerrol Banana., MD as PCP - General (Family Medicine) [name], MD as Consulting Physician (Cardiology)  CHIEF COMPLAINT: Clinical stage IIIB triple negative invasive carcinoma of the lower inner quadrant of the left breast.  INTERVAL HISTORY: Patient returns to clinic today for further evaluation and consideration of cycle 6, day 8 of carboplatinum and Taxol.  Taxol only today.  She continues to tolerate her treatments relatively well.  She does not complain of weakness or fatigue today. She denies any recent fevers or illnesses.  She has no neurologic complaints.  She has a good appetite and denies weight loss.  She has no chest pain or shortness of breath.  She denies any nausea, vomiting, constipation, or diarrhea.  She has no urinary complaints.  Patient offers no specific complaints today.  REVIEW OF SYSTEMS:   Review of Systems  Constitutional: Negative.  Negative for fever, malaise/fatigue and weight loss.  HENT: Negative.  Negative for congestion, ear pain and sore throat.   Respiratory: Negative.  Negative for cough, hemoptysis and shortness of breath.   Cardiovascular: Negative.  Negative for chest pain and leg swelling.  Gastrointestinal: Negative.  Negative for abdominal pain and melena.  Genitourinary: Negative.  Negative for dysuria.  Musculoskeletal: Negative.  Negative for back pain.  Skin: Negative.  Negative for rash.  Neurological: Negative.  Negative for dizziness, focal weakness, weakness and headaches.  Psychiatric/Behavioral: The patient is nervous/anxious.     As per HPI. Otherwise, a complete review of systems is negative.  PAST MEDICAL HISTORY: Past Medical History:  Diagnosis Date  . Anemia    h/o with pregnancy  . Anxiety   . Asthma    allergy induced-no inhaler  .  Cancer ([place])   . Complication of anesthesia   . Environmental allergies   . Family history of adverse reaction to anesthesia    son-breathing problems-coded 1st time when he was 2 and had another surgery at 53 and had to be admitted for breathing problems  . Family history of breast cancer   . GERD (gastroesophageal reflux disease)   . Headache    migraines  . Hypertension   . Mitral valve disease   . Mitral valve prolapse   . Painful menstrual periods   . PONV (postoperative nausea and vomiting)   . Vertigo     PAST SURGICAL HISTORY: Past Surgical History:  Procedure Laterality Date  . ABDOMINAL HYSTERECTOMY  2010   supracervical   . BUNIONECTOMY Bilateral   . MANDIBLE RECONSTRUCTION     age 48-underbite  . PORTACATH PLACEMENT Right 12/31/2017   Procedure: INSERTION PORT-A-CATH;  Surgeon: Robert Bellow, MD;  Location: [facility] ORS;  Service: General;  Laterality: Right;  . SHOULDER ARTHROSCOPY WITH OPEN ROTATOR CUFF REPAIR Right 04/26/2017   Procedure: SHOULDER ARTHROSCOPY WITH OPEN ROTATOR CUFF REPAIR,SUBACROMINAL DECOMPRESSION;  Surgeon: Thornton Park, MD;  Location: [facility] ORS;  Service: Orthopedics;  Laterality: Right;  . TONSILLECTOMY      FAMILY HISTORY: Family History  Problem Relation Age of Onset  . Breast cancer Mother 67  . Diabetes Father   . Cancer Maternal Uncle        colon  . Breast cancer Maternal Grandmother 74    ADVANCED DIRECTIVES (Y/N):  N  HEALTH MAINTENANCE: Social History   Tobacco Use  . Smoking status: Former Smoker  Packs/day: 0.50    Years: 10.00    Pack years: 5.00    Types: Cigarettes    Last attempt to quit: 04/20/2007    Years since quitting: 10.9  . Smokeless tobacco: Never Used  . Tobacco comment: social smoker back then  Substance Use Topics  . Alcohol use: Yes    Comment: occas  . Drug use: No     Colonoscopy:  PAP:  Bone density:  Lipid panel:  Allergies  Allergen Reactions  . Hydrocodone-Acetaminophen Hives     swollen face  . Iodine Hives  . Peanut Butter Flavor Other (See Comments)    Made mouth tingle  . Shellfish Allergy Hives    Current Outpatient Medications  Medication Sig Dispense Refill  . ALPRAZolam (XANAX) 0.25 MG tablet Take 1 tablet (0.25 mg total) by mouth 2 (two) times daily as needed for anxiety. 60 tablet 0  . Ascorbic Acid (VITAMIN C PO) Take 1 tablet by mouth daily.     . Cholecalciferol (VITAMIN D) 50 MCG (2000 UT) CAPS Take 2,000 Units by mouth daily.    Marland Kitchen dexamethasone (DECADRON) 4 MG tablet Take 1 tablet (4 mg total) by mouth daily. 5 tablet 0  . EPINEPHrine 0.3 mg/0.3 mL IJ SOAJ injection Inject 0.3 mLs (0.3 mg total) into the muscle once. 1 Device 12  . famotidine (PEPCID) 20 MG tablet   0  . fluticasone (FLONASE) 50 MCG/ACT nasal spray Place 1 spray into both nostrils daily as needed for allergies or rhinitis.    Marland Kitchen ibuprofen (ADVIL,MOTRIN) 200 MG tablet Take 400 mg by mouth daily as needed for moderate pain.    Marland Kitchen L-Methylfolate 15 MG TABS TAKE 1/2 TABLET (7.5 MG) BY MOUTH DAILY 45 tablet 3  . lidocaine (XYLOCAINE) 2 % jelly Apply 1 application topically as needed. For pain 30 mL 0  . lidocaine-prilocaine (EMLA) cream Apply to affected area once 30 g 3  . losartan (COZAAR) 50 MG tablet TAKE 1 TABLET BY MOUTH DAILY 90 tablet 3  . magnesium hydroxide (MILK OF MAGNESIA) 400 MG/5ML suspension Take 5 mLs by mouth daily as needed for mild constipation.    . montelukast (SINGULAIR) 10 MG tablet Take 1 tablet (10 mg total) by mouth at bedtime. 30 tablet 12  . ondansetron (ZOFRAN) 8 MG tablet Take 1 tablet (8 mg total) by mouth 2 (two) times daily as needed. 30 tablet 2  . Probiotic CAPS Take 1 capsule by mouth at bedtime.    . prochlorperazine (COMPAZINE) 10 MG tablet Take 1 tablet (10 mg total) by mouth every 6 (six) hours as needed (Nausea or vomiting). 60 tablet 2  . tretinoin (RETIN-A) 0.1 % cream Apply 1 application topically at bedtime.     No current  facility-administered medications for this visit.     OBJECTIVE: Vitals:   03/28/18 0925  BP: 140/74  Pulse: (!) 112  Temp: (!) 97.5 F (36.4 C)     Body mass index is 22.61 kg/m.    ECOG FS:0 - Asymptomatic  General: Well-developed, well-nourished, no acute distress. Eyes: Pink conjunctiva, anicteric sclera. HEENT: Normocephalic, moist mucous membranes. Lungs: Clear to auscultation bilaterally. Heart: Regular rate and rhythm. No rubs, murmurs, or gallops. Abdomen: Soft, nontender, nondistended. No organomegaly noted, normoactive bowel sounds. Musculoskeletal: No edema, cyanosis, or clubbing. Neuro: Alert, answering all questions appropriately. Cranial nerves grossly intact. Skin: No rashes or petechiae noted. Psych: Normal affect.  LAB RESULTS:  Lab Results  Component Value Date   NA 134 (  L) 03/28/2018   K 3.7 03/28/2018   CL 101 03/28/2018   CO2 26 03/28/2018   GLUCOSE 123 (H) 03/28/2018   BUN 18 03/28/2018   CREATININE 0.54 03/28/2018   CALCIUM 9.3 03/28/2018   PROT 7.1 03/28/2018   ALBUMIN 4.3 03/28/2018   AST 66 (H) 03/28/2018   ALT 130 (H) 03/28/2018   ALKPHOS 63 03/28/2018   BILITOT 0.5 03/28/2018   GFRNONAA >60 03/28/2018   GFRAA >60 03/28/2018    Lab Results  Component Value Date   WBC 1.7 (L) 03/28/2018   NEUTROABS 0.8 (L) 03/28/2018   HGB 8.8 (L) 03/28/2018   HCT 25.3 (L) 03/28/2018   MCV 96.9 03/28/2018   PLT 210 03/28/2018     STUDIES: No results found.  ASSESSMENT: Clinical stage IIIB triple negative invasive carcinoma of the lower inner quadrant of the left breast.  PLAN:    1. Clinical stage IIIB triple negative invasive carcinoma of the lower inner quadrant of the left breast: CT scan results from January 01, 2018 did not reveal any metastatic disease.  Given patient's stage of disease, she will benefit from neoadjuvant chemotherapy using Adriamycin, Cytoxan with Udenyca support followed by carboplatinum and Taxol.  She completed cycle  4 of Adriamycin and Cytoxan on February 14, 2018.  Patient has indicated she may prefer to undergo mastectomy, therefore adjuvant XRT may not be needed.  An aromatase inhibitor would not offer any benefit given the triple negative status of her disease.  Also discussed today with the possibility of using capecitabine adjuvantly if patient does not have a complete pathologic response.  MUGA scan on January 01, 2018 revealed an EF of 71%.  Delay cycle 6, day 8 of Taxol only today secondary to neutropenia.  Return to clinic in 1 week for further evaluation and reconsideration of cycle 6, day 8.   2.  Anxiety: Chronic.  Continue Xanax as needed. 3.  Anemia: Patient's hemoglobin is decreased, but relatively stable at 8.8.  Monitor. 4.  Headache: Patient does not complain of this today.  Continue ibuprofen sparingly as needed. 5.  Neutropenia: Patient's ANC is 0.8 today, despite receiving Zarxio after her last treatment.  Delay treatment as above.  Consider additional Zarxio in the future.   6.  Weakness and fatigue: Patient does not complain of this today, but typically requires IV fluids on the Friday after her Thursday infusions. 7.  Liver enzymes: AST and ALT remain mildly elevated, but improved.  Monitor.  Patient expressed understanding and was in agreement with this plan. She also understands that She can call clinic at any time with any questions, concerns, or complaints.   Cancer Staging Primary cancer of lower-inner quadrant of left female breast [facility]) Staging form: Breast, AJCC 8th Edition - Clinical stage from 12/24/2017: Stage IIIB (cT2, cN1, cM0, G3, ER-, PR-, HER2-) - Signed by Lloyd Huger, MD on 12/24/2017 - Pathologic: No stage assigned - Unsigned   Lloyd Huger, MD   03/29/2018 10:03 PM

## 2018-03-25 ENCOUNTER — Other Ambulatory Visit: Payer: Self-pay

## 2018-03-25 ENCOUNTER — Telehealth: Payer: Self-pay | Admitting: *Deleted

## 2018-03-25 ENCOUNTER — Inpatient Hospital Stay: Payer: No Typology Code available for payment source | Admitting: Nurse Practitioner

## 2018-03-25 ENCOUNTER — Inpatient Hospital Stay: Payer: No Typology Code available for payment source

## 2018-03-25 ENCOUNTER — Encounter: Payer: Self-pay | Admitting: Nurse Practitioner

## 2018-03-25 VITALS — BP 135/88 | HR 85 | Temp 98.5°F | Resp 18 | Wt 140.0 lb

## 2018-03-25 DIAGNOSIS — C50312 Malignant neoplasm of lower-inner quadrant of left female breast: Secondary | ICD-10-CM

## 2018-03-25 DIAGNOSIS — Z95828 Presence of other vascular implants and grafts: Secondary | ICD-10-CM

## 2018-03-25 DIAGNOSIS — R5383 Other fatigue: Secondary | ICD-10-CM

## 2018-03-25 DIAGNOSIS — Z5111 Encounter for antineoplastic chemotherapy: Secondary | ICD-10-CM | POA: Diagnosis not present

## 2018-03-25 LAB — COMPREHENSIVE METABOLIC PANEL
ALK PHOS: 63 U/L (ref 38–126)
ALT: 167 U/L — ABNORMAL HIGH (ref 0–44)
ANION GAP: 8 (ref 5–15)
AST: 101 U/L — ABNORMAL HIGH (ref 15–41)
Albumin: 4.3 g/dL (ref 3.5–5.0)
BUN: 19 mg/dL (ref 6–20)
CO2: 27 mmol/L (ref 22–32)
Calcium: 9.3 mg/dL (ref 8.9–10.3)
Chloride: 96 mmol/L — ABNORMAL LOW (ref 98–111)
Creatinine, Ser: 0.47 mg/dL (ref 0.44–1.00)
GFR calc Af Amer: >60 mL/min (ref >60)
GFR calc non Af Amer: >60 mL/min (ref >60)
Glucose, Bld: 116 mg/dL — ABNORMAL HIGH (ref 70–99)
Potassium: 3.9 mmol/L (ref 3.5–5.1)
Sodium: 131 mmol/L — ABNORMAL LOW (ref 135–145)
Total Bilirubin: 0.7 mg/dL (ref 0.3–1.2)
Total Protein: 7 g/dL (ref 6.5–8.1)

## 2018-03-25 LAB — CBC WITH DIFFERENTIAL/PLATELET
Abs Immature Granulocytes: 0.03 10*3/uL (ref 0.00–0.07)
Basophils Absolute: 0 10*3/uL (ref 0.0–0.1)
Basophils Relative: 0 %
EOS ABS: 0 10*3/uL (ref 0.0–0.5)
Eosinophils Relative: 1 %
HCT: 26.5 % — ABNORMAL LOW (ref 36.0–46.0)
Hemoglobin: 9.2 g/dL — ABNORMAL LOW (ref 12.0–15.0)
Immature Granulocytes: 1 %
LYMPHS ABS: 0.7 10*3/uL (ref 0.7–4.0)
Lymphocytes Relative: 22 %
MCH: 33.1 pg (ref 26.0–34.0)
MCHC: 34.7 g/dL (ref 30.0–36.0)
MCV: 95.3 fL (ref 80.0–100.0)
Monocytes Absolute: 0.1 10*3/uL (ref 0.1–1.0)
Monocytes Relative: 3 %
NRBC: 0 % (ref 0.0–0.2)
Neutro Abs: 2.5 10*3/uL (ref 1.7–7.7)
Neutrophils Relative %: 73 %
Platelets: 264 10*3/uL (ref 150–400)
RBC: 2.78 MIL/uL — ABNORMAL LOW (ref 3.87–5.11)
RDW: 18.1 % — ABNORMAL HIGH (ref 11.5–15.5)
WBC: 3.4 10*3/uL — ABNORMAL LOW (ref 4.0–10.5)

## 2018-03-25 MED ORDER — SODIUM CHLORIDE 0.9 % IV SOLN
Freq: Once | INTRAVENOUS | Status: AC
  Administered 2018-03-25: 15:00:00 via INTRAVENOUS
  Filled 2018-03-25: qty 250

## 2018-03-25 MED ORDER — HEPARIN SOD (PORK) LOCK FLUSH 100 UNIT/ML IV SOLN
500.0000 [IU] | Freq: Once | INTRAVENOUS | Status: AC
  Administered 2018-03-25: 500 [IU] via INTRAVENOUS

## 2018-03-25 MED ORDER — SODIUM CHLORIDE 0.9% FLUSH
10.0000 mL | INTRAVENOUS | Status: DC | PRN
  Administered 2018-03-25: 10 mL via INTRAVENOUS
  Filled 2018-03-25: qty 10

## 2018-03-25 NOTE — Telephone Encounter (Signed)
Patient called triage saying that she thinks she needs iv fluids. Patient given appt for today at 2:30 to have labs, see Beckey Rutter, NP ang get fluids if necessary.   Patient verbalized understanding.  dhs

## 2018-03-25 NOTE — Progress Notes (Signed)
Note in error.

## 2018-03-28 ENCOUNTER — Other Ambulatory Visit: Payer: Self-pay

## 2018-03-28 ENCOUNTER — Inpatient Hospital Stay: Payer: No Typology Code available for payment source

## 2018-03-28 ENCOUNTER — Inpatient Hospital Stay (HOSPITAL_BASED_OUTPATIENT_CLINIC_OR_DEPARTMENT_OTHER): Payer: No Typology Code available for payment source | Admitting: Oncology

## 2018-03-28 VITALS — BP 140/74 | HR 112 | Temp 97.5°F | Ht 66.0 in | Wt 140.1 lb

## 2018-03-28 DIAGNOSIS — Z171 Estrogen receptor negative status [ER-]: Secondary | ICD-10-CM

## 2018-03-28 DIAGNOSIS — D649 Anemia, unspecified: Secondary | ICD-10-CM | POA: Diagnosis not present

## 2018-03-28 DIAGNOSIS — R740 Nonspecific elevation of levels of transaminase and lactic acid dehydrogenase [LDH]: Secondary | ICD-10-CM

## 2018-03-28 DIAGNOSIS — C50312 Malignant neoplasm of lower-inner quadrant of left female breast: Secondary | ICD-10-CM | POA: Diagnosis not present

## 2018-03-28 DIAGNOSIS — Z5111 Encounter for antineoplastic chemotherapy: Secondary | ICD-10-CM | POA: Diagnosis not present

## 2018-03-28 DIAGNOSIS — F419 Anxiety disorder, unspecified: Secondary | ICD-10-CM

## 2018-03-28 DIAGNOSIS — D709 Neutropenia, unspecified: Secondary | ICD-10-CM | POA: Diagnosis not present

## 2018-03-28 LAB — CBC WITH DIFFERENTIAL/PLATELET
Abs Immature Granulocytes: 0.01 10*3/uL (ref 0.00–0.07)
Basophils Absolute: 0 10*3/uL (ref 0.0–0.1)
Basophils Relative: 1 %
Eosinophils Absolute: 0 10*3/uL (ref 0.0–0.5)
Eosinophils Relative: 2 %
HCT: 25.3 % — ABNORMAL LOW (ref 36.0–46.0)
Hemoglobin: 8.8 g/dL — ABNORMAL LOW (ref 12.0–15.0)
Immature Granulocytes: 1 %
LYMPHS PCT: 36 %
Lymphs Abs: 0.6 10*3/uL — ABNORMAL LOW (ref 0.7–4.0)
MCH: 33.7 pg (ref 26.0–34.0)
MCHC: 34.8 g/dL (ref 30.0–36.0)
MCV: 96.9 fL (ref 80.0–100.0)
Monocytes Absolute: 0.2 10*3/uL (ref 0.1–1.0)
Monocytes Relative: 13 %
Neutro Abs: 0.8 10*3/uL — ABNORMAL LOW (ref 1.7–7.7)
Neutrophils Relative %: 47 %
Platelets: 210 10*3/uL (ref 150–400)
RBC: 2.61 MIL/uL — ABNORMAL LOW (ref 3.87–5.11)
RDW: 17.3 % — ABNORMAL HIGH (ref 11.5–15.5)
WBC: 1.7 10*3/uL — ABNORMAL LOW (ref 4.0–10.5)
nRBC: 0 % (ref 0.0–0.2)

## 2018-03-28 LAB — COMPREHENSIVE METABOLIC PANEL
ALT: 130 U/L — AB (ref 0–44)
AST: 66 U/L — ABNORMAL HIGH (ref 15–41)
Albumin: 4.3 g/dL (ref 3.5–5.0)
Alkaline Phosphatase: 63 U/L (ref 38–126)
Anion gap: 7 (ref 5–15)
BUN: 18 mg/dL (ref 6–20)
CO2: 26 mmol/L (ref 22–32)
CREATININE: 0.54 mg/dL (ref 0.44–1.00)
Calcium: 9.3 mg/dL (ref 8.9–10.3)
Chloride: 101 mmol/L (ref 98–111)
GFR calc Af Amer: >60 mL/min (ref >60)
GFR calc non Af Amer: >60 mL/min (ref >60)
GLUCOSE: 123 mg/dL — AB (ref 70–99)
Potassium: 3.7 mmol/L (ref 3.5–5.1)
Sodium: 134 mmol/L — ABNORMAL LOW (ref 135–145)
Total Bilirubin: 0.5 mg/dL (ref 0.3–1.2)
Total Protein: 7.1 g/dL (ref 6.5–8.1)

## 2018-03-28 MED ORDER — HEPARIN SOD (PORK) LOCK FLUSH 100 UNIT/ML IV SOLN
500.0000 [IU] | Freq: Once | INTRAVENOUS | Status: AC
  Administered 2018-03-28: 500 [IU] via INTRAVENOUS
  Filled 2018-03-28: qty 5

## 2018-03-28 MED ORDER — SODIUM CHLORIDE 0.9% FLUSH
10.0000 mL | Freq: Once | INTRAVENOUS | Status: AC
  Administered 2018-03-28: 10 mL via INTRAVENOUS
  Filled 2018-03-28: qty 10

## 2018-03-28 NOTE — Progress Notes (Signed)
Patient is here today to follow up on her primary cancer of lower-inner quadrant of left breast. Patient stated that she felts dizzy after she had her treatment last week. It lasted about 3-5 minutes but she still did not feel good the rest of the day. The following day she started to feel better. Patient stated that she has a tingling feeling on her left foot on/off. Patient stated that her appetite comes and goes. Patient denied fever, chills, nausea, vomiting, constipation or diarrhea.

## 2018-04-01 NOTE — Progress Notes (Signed)
[place]  [phone] Fax:(336) [phone]  ID: Shelby [name] OB: Oct 04, 1970  MR#: [id number]  OZY#:[id number]  Patient Care Team: Jerrol Banana., MD as PCP - General (Family Medicine) [name], MD as Consulting Physician (Cardiology)  CHIEF COMPLAINT: Clinical stage IIIB triple negative invasive carcinoma of the lower inner quadrant of the left breast.  INTERVAL HISTORY: Patient returns to clinic today for further evaluation and reconsideration of cycle 6, day 8 of carboplatinum and Taxol.  Taxol only today.  She currently feels well and is asymptomatic.  She continues to tolerate her treatments relatively well, particularly when she receives 1 L of IV fluids 24 hours after her treatment.  She does not complain of weakness or fatigue today. She denies any recent fevers or illnesses.  She has no neurologic complaints.  She has a good appetite and denies weight loss. She has no chest pain or shortness of breath.  She denies any nausea, vomiting, constipation, or diarrhea.  She has no urinary complaints.  Patient offers no specific complaints today.  REVIEW OF SYSTEMS:   Review of Systems  Constitutional: Negative.  Negative for fever, malaise/fatigue and weight loss.  HENT: Negative.  Negative for congestion, ear pain and sore throat.   Respiratory: Negative.  Negative for cough, hemoptysis and shortness of breath.   Cardiovascular: Negative.  Negative for chest pain and leg swelling.  Gastrointestinal: Negative.  Negative for abdominal pain and melena.  Genitourinary: Negative.  Negative for dysuria.  Musculoskeletal: Negative.  Negative for back pain.  Skin: Negative.  Negative for rash.  Neurological: Negative.  Negative for dizziness, focal weakness, weakness and headaches.  Psychiatric/Behavioral: The patient is nervous/anxious.     As per HPI. Otherwise, a complete review of systems is negative.  PAST MEDICAL HISTORY: Past  Medical History:  Diagnosis Date  . Anemia    h/o with pregnancy  . Anxiety   . Asthma    allergy induced-no inhaler  . Cancer ([place])   . Complication of anesthesia   . Environmental allergies   . Family history of adverse reaction to anesthesia    son-breathing problems-coded 1st time when he was 2 and had another surgery at 65 and had to be admitted for breathing problems  . Family history of breast cancer   . GERD (gastroesophageal reflux disease)   . Headache    migraines  . Hypertension   . Mitral valve disease   . Mitral valve prolapse   . Painful menstrual periods   . PONV (postoperative nausea and vomiting)   . Vertigo     PAST SURGICAL HISTORY: Past Surgical History:  Procedure Laterality Date  . ABDOMINAL HYSTERECTOMY  2010   supracervical   . BUNIONECTOMY Bilateral   . MANDIBLE RECONSTRUCTION     age 67-underbite  . PORTACATH PLACEMENT Right 12/31/2017   Procedure: INSERTION PORT-A-CATH;  Surgeon: Robert Bellow, MD;  Location: [facility] ORS;  Service: General;  Laterality: Right;  . SHOULDER ARTHROSCOPY WITH OPEN ROTATOR CUFF REPAIR Right 04/26/2017   Procedure: SHOULDER ARTHROSCOPY WITH OPEN ROTATOR CUFF REPAIR,SUBACROMINAL DECOMPRESSION;  Surgeon: Thornton Park, MD;  Location: [facility] ORS;  Service: Orthopedics;  Laterality: Right;  . TONSILLECTOMY      FAMILY HISTORY: Family History  Problem Relation Age of Onset  . Breast cancer Mother 21  . Diabetes Father   . Cancer Maternal Uncle        colon  . Breast cancer Maternal Grandmother 54    ADVANCED  DIRECTIVES (Y/N):  N  HEALTH MAINTENANCE: Social History   Tobacco Use  . Smoking status: Former Smoker    Packs/day: 0.50    Years: 10.00    Pack years: 5.00    Types: Cigarettes    Last attempt to quit: 04/20/2007    Years since quitting: 10.9  . Smokeless tobacco: Never Used  . Tobacco comment: social smoker back then  Substance Use Topics  . Alcohol use: Yes    Comment: occas  . Drug use: No       Colonoscopy:  PAP:  Bone density:  Lipid panel:  Allergies  Allergen Reactions  . Hydrocodone-Acetaminophen Hives    swollen face  . Iodine Hives  . Peanut Butter Flavor Other (See Comments)    Made mouth tingle  . Shellfish Allergy Hives    Current Outpatient Medications  Medication Sig Dispense Refill  . ALPRAZolam (XANAX) 0.25 MG tablet Take 1 tablet (0.25 mg total) by mouth 2 (two) times daily as needed for anxiety. 60 tablet 0  . Ascorbic Acid (VITAMIN C PO) Take 1 tablet by mouth daily.     . Cholecalciferol (VITAMIN D) 50 MCG (2000 UT) CAPS Take 2,000 Units by mouth daily.    Marland Kitchen dexamethasone (DECADRON) 4 MG tablet Take 1 tablet (4 mg total) by mouth daily. 5 tablet 0  . EPINEPHrine 0.3 mg/0.3 mL IJ SOAJ injection Inject 0.3 mLs (0.3 mg total) into the muscle once. 1 Device 12  . famotidine (PEPCID) 20 MG tablet   0  . fluticasone (FLONASE) 50 MCG/ACT nasal spray Place 1 spray into both nostrils daily as needed for allergies or rhinitis.    Marland Kitchen ibuprofen (ADVIL,MOTRIN) 200 MG tablet Take 400 mg by mouth daily as needed for moderate pain.    Marland Kitchen L-Methylfolate 15 MG TABS TAKE 1/2 TABLET (7.5 MG) BY MOUTH DAILY 45 tablet 3  . lidocaine (XYLOCAINE) 2 % jelly Apply 1 application topically as needed. For pain 30 mL 0  . lidocaine-prilocaine (EMLA) cream Apply to affected area once 30 g 3  . losartan (COZAAR) 50 MG tablet TAKE 1 TABLET BY MOUTH DAILY 90 tablet 3  . magnesium hydroxide (MILK OF MAGNESIA) 400 MG/5ML suspension Take 5 mLs by mouth daily as needed for mild constipation.    . montelukast (SINGULAIR) 10 MG tablet Take 1 tablet (10 mg total) by mouth at bedtime. 30 tablet 12  . ondansetron (ZOFRAN) 8 MG tablet Take 1 tablet (8 mg total) by mouth 2 (two) times daily as needed. 30 tablet 2  . Probiotic CAPS Take 1 capsule by mouth at bedtime.    . prochlorperazine (COMPAZINE) 10 MG tablet Take 1 tablet (10 mg total) by mouth every 6 (six) hours as needed (Nausea or  vomiting). 60 tablet 2  . tretinoin (RETIN-A) 0.1 % cream Apply 1 application topically at bedtime.     No current facility-administered medications for this visit.     OBJECTIVE: Vitals:   04/04/18 1401  BP: 127/89  Pulse: 91  Temp: 98.2 F (36.8 C)     Body mass index is 23.08 kg/m.    ECOG FS:0 - Asymptomatic  General: Well-developed, well-nourished, no acute distress. Eyes: Pink conjunctiva, anicteric sclera. HEENT: Normocephalic, moist mucous membranes. Breast: Exam deferred today. Lungs: Clear to auscultation bilaterally. Heart: Regular rate and rhythm. No rubs, murmurs, or gallops. Abdomen: Soft, nontender, nondistended. No organomegaly noted, normoactive bowel sounds. Musculoskeletal: No edema, cyanosis, or clubbing. Neuro: Alert, answering all questions appropriately. Cranial nerves  grossly intact. Skin: No rashes or petechiae noted. Psych: Normal affect.  LAB RESULTS:  Lab Results  Component Value Date   NA 136 04/04/2018   K 4.0 04/04/2018   CL 102 04/04/2018   CO2 27 04/04/2018   GLUCOSE 121 (H) 04/04/2018   BUN 14 04/04/2018   CREATININE 0.64 04/04/2018   CALCIUM 9.1 04/04/2018   PROT 7.0 04/04/2018   ALBUMIN 4.3 04/04/2018   AST 44 (H) 04/04/2018   ALT 79 (H) 04/04/2018   ALKPHOS 59 04/04/2018   BILITOT 0.5 04/04/2018   GFRNONAA >60 04/04/2018   GFRAA >60 04/04/2018    Lab Results  Component Value Date   WBC 2.2 (L) 04/04/2018   NEUTROABS 1.0 (L) 04/04/2018   HGB 8.9 (L) 04/04/2018   HCT 25.9 (L) 04/04/2018   MCV 100.4 (H) 04/04/2018   PLT 133 (L) 04/04/2018     STUDIES: No results found.  ASSESSMENT: Clinical stage IIIB triple negative invasive carcinoma of the lower inner quadrant of the left breast.  PLAN:    1. Clinical stage IIIB triple negative invasive carcinoma of the lower inner quadrant of the left breast: CT scan results from January 01, 2018 did not reveal any metastatic disease.  Given patient's stage of disease, she will  benefit from neoadjuvant chemotherapy using Adriamycin, Cytoxan with Udenyca support followed by carboplatinum and Taxol.  She completed cycle 4 of Adriamycin and Cytoxan on February 14, 2018.  Patient has indicated she may prefer to undergo mastectomy, therefore adjuvant XRT may not be needed.  An aromatase inhibitor would not offer any benefit given the triple negative status of her disease.  Also discussed today with the possibility of using capecitabine adjuvantly if patient does not have a complete pathologic response.  MUGA scan on January 01, 2018 revealed an EF of 71%.  Proceed with cycle 6, day 8 of Taxol only today.  Because of patient's relative neutropenia, she will receive Neulasta tomorrow and then return to clinic in 2 weeks for consideration of cycle 6, day 15.   2.  Anxiety: Chronic.  Continue Xanax as needed. 3.  Anemia: Hemoglobin is decreased, but stable at 8.9.  Monitor. 4.  Headache: Patient does not complain of this today.  Continue ibuprofen sparingly as needed. 5.  Neutropenia: Patient's ANC is 1.0 today.  Proceed with treatment as above and return to clinic tomorrow for Neulasta.   6.  Weakness and fatigue: Patient does not complain of this today, but will continue with IV fluids on the Friday after her Thursday infusions. 7.  Liver enzymes: Chronic and unchanged.  AST and ALT remain mildly elevated, but improved.  Monitor. 8.  Thrombocytopenia: Mild, monitor.  Proceed with treatment as above.  Patient expressed understanding and was in agreement with this plan. She also understands that She can call clinic at any time with any questions, concerns, or complaints.   Cancer Staging Primary cancer of lower-inner quadrant of left female breast [facility]) Staging form: Breast, AJCC 8th Edition - Clinical stage from 12/24/2017: Stage IIIB (cT2, cN1, cM0, G3, ER-, PR-, HER2-) - Signed by Lloyd Huger, MD on 12/24/2017 - Pathologic: No stage assigned - Unsigned   Lloyd Huger, MD   04/06/2018 6:39 AM

## 2018-04-04 ENCOUNTER — Inpatient Hospital Stay (HOSPITAL_BASED_OUTPATIENT_CLINIC_OR_DEPARTMENT_OTHER): Payer: No Typology Code available for payment source | Admitting: Oncology

## 2018-04-04 ENCOUNTER — Other Ambulatory Visit: Payer: Self-pay

## 2018-04-04 ENCOUNTER — Inpatient Hospital Stay (HOSPITAL_BASED_OUTPATIENT_CLINIC_OR_DEPARTMENT_OTHER): Payer: No Typology Code available for payment source

## 2018-04-04 ENCOUNTER — Inpatient Hospital Stay: Payer: No Typology Code available for payment source | Attending: Oncology

## 2018-04-04 ENCOUNTER — Encounter: Payer: Self-pay | Admitting: Oncology

## 2018-04-04 VITALS — BP 127/89 | HR 91 | Temp 98.2°F | Ht 66.0 in | Wt 143.0 lb

## 2018-04-04 DIAGNOSIS — C50312 Malignant neoplasm of lower-inner quadrant of left female breast: Secondary | ICD-10-CM | POA: Insufficient documentation

## 2018-04-04 DIAGNOSIS — Z171 Estrogen receptor negative status [ER-]: Secondary | ICD-10-CM

## 2018-04-04 DIAGNOSIS — D649 Anemia, unspecified: Secondary | ICD-10-CM | POA: Diagnosis not present

## 2018-04-04 DIAGNOSIS — D709 Neutropenia, unspecified: Secondary | ICD-10-CM

## 2018-04-04 DIAGNOSIS — Z5111 Encounter for antineoplastic chemotherapy: Secondary | ICD-10-CM

## 2018-04-04 DIAGNOSIS — Z87891 Personal history of nicotine dependence: Secondary | ICD-10-CM | POA: Diagnosis not present

## 2018-04-04 DIAGNOSIS — Z5189 Encounter for other specified aftercare: Secondary | ICD-10-CM | POA: Insufficient documentation

## 2018-04-04 DIAGNOSIS — F419 Anxiety disorder, unspecified: Secondary | ICD-10-CM | POA: Diagnosis not present

## 2018-04-04 DIAGNOSIS — R7989 Other specified abnormal findings of blood chemistry: Secondary | ICD-10-CM

## 2018-04-04 DIAGNOSIS — D696 Thrombocytopenia, unspecified: Secondary | ICD-10-CM | POA: Diagnosis not present

## 2018-04-04 LAB — CBC WITH DIFFERENTIAL/PLATELET
Abs Immature Granulocytes: 0 10*3/uL (ref 0.00–0.07)
Basophils Absolute: 0 10*3/uL (ref 0.0–0.1)
Basophils Relative: 1 %
Eosinophils Absolute: 0 10*3/uL (ref 0.0–0.5)
Eosinophils Relative: 1 %
HCT: 25.9 % — ABNORMAL LOW (ref 36.0–46.0)
Hemoglobin: 8.9 g/dL — ABNORMAL LOW (ref 12.0–15.0)
IMMATURE GRANULOCYTES: 0 %
Lymphocytes Relative: 35 %
Lymphs Abs: 0.8 10*3/uL (ref 0.7–4.0)
MCH: 34.5 pg — ABNORMAL HIGH (ref 26.0–34.0)
MCHC: 34.4 g/dL (ref 30.0–36.0)
MCV: 100.4 fL — ABNORMAL HIGH (ref 80.0–100.0)
Monocytes Absolute: 0.3 10*3/uL (ref 0.1–1.0)
Monocytes Relative: 16 %
Neutro Abs: 1 10*3/uL — ABNORMAL LOW (ref 1.7–7.7)
Neutrophils Relative %: 47 %
Platelets: 133 10*3/uL — ABNORMAL LOW (ref 150–400)
RBC: 2.58 MIL/uL — ABNORMAL LOW (ref 3.87–5.11)
RDW: 18.6 % — ABNORMAL HIGH (ref 11.5–15.5)
WBC: 2.2 10*3/uL — ABNORMAL LOW (ref 4.0–10.5)
nRBC: 0 % (ref 0.0–0.2)

## 2018-04-04 LAB — COMPREHENSIVE METABOLIC PANEL
ALT: 79 U/L — ABNORMAL HIGH (ref 0–44)
AST: 44 U/L — ABNORMAL HIGH (ref 15–41)
Albumin: 4.3 g/dL (ref 3.5–5.0)
Alkaline Phosphatase: 59 U/L (ref 38–126)
Anion gap: 7 (ref 5–15)
BUN: 14 mg/dL (ref 6–20)
CO2: 27 mmol/L (ref 22–32)
Calcium: 9.1 mg/dL (ref 8.9–10.3)
Chloride: 102 mmol/L (ref 98–111)
Creatinine, Ser: 0.64 mg/dL (ref 0.44–1.00)
GFR calc Af Amer: >60 mL/min (ref >60)
GFR calc non Af Amer: >60 mL/min (ref >60)
Glucose, Bld: 121 mg/dL — ABNORMAL HIGH (ref 70–99)
Potassium: 4 mmol/L (ref 3.5–5.1)
Sodium: 136 mmol/L (ref 135–145)
Total Bilirubin: 0.5 mg/dL (ref 0.3–1.2)
Total Protein: 7 g/dL (ref 6.5–8.1)

## 2018-04-04 LAB — SAMPLE TO BLOOD BANK

## 2018-04-04 MED ORDER — SODIUM CHLORIDE 0.9 % IV SOLN
Freq: Once | INTRAVENOUS | Status: AC
  Administered 2018-04-04: 15:00:00 via INTRAVENOUS
  Filled 2018-04-04: qty 250

## 2018-04-04 MED ORDER — SODIUM CHLORIDE 0.9 % IV SOLN
80.0000 mg/m2 | Freq: Once | INTRAVENOUS | Status: AC
  Administered 2018-04-04: 138 mg via INTRAVENOUS
  Filled 2018-04-04: qty 23

## 2018-04-04 MED ORDER — FAMOTIDINE IN NACL 20-0.9 MG/50ML-% IV SOLN
20.0000 mg | Freq: Once | INTRAVENOUS | Status: AC
  Administered 2018-04-04: 20 mg via INTRAVENOUS
  Filled 2018-04-04: qty 50

## 2018-04-04 MED ORDER — DEXAMETHASONE SODIUM PHOSPHATE 10 MG/ML IJ SOLN
10.0000 mg | Freq: Once | INTRAMUSCULAR | Status: AC
  Administered 2018-04-04: 10 mg via INTRAVENOUS
  Filled 2018-04-04: qty 1

## 2018-04-04 MED ORDER — DIPHENHYDRAMINE HCL 50 MG/ML IJ SOLN
25.0000 mg | Freq: Once | INTRAMUSCULAR | Status: AC
  Administered 2018-04-04: 25 mg via INTRAVENOUS
  Filled 2018-04-04: qty 1

## 2018-04-04 MED ORDER — SODIUM CHLORIDE 0.9% FLUSH
10.0000 mL | INTRAVENOUS | Status: DC | PRN
  Filled 2018-04-04: qty 10

## 2018-04-04 MED ORDER — HEPARIN SOD (PORK) LOCK FLUSH 100 UNIT/ML IV SOLN
500.0000 [IU] | Freq: Once | INTRAVENOUS | Status: AC | PRN
  Administered 2018-04-04: 500 [IU]
  Filled 2018-04-04: qty 5

## 2018-04-04 NOTE — Progress Notes (Signed)
Patient is here today to follow up on her primary cancer of lower-inner quadrant of left breast. Patient stated that she had been doing well with no complaints.

## 2018-04-05 ENCOUNTER — Inpatient Hospital Stay: Payer: No Typology Code available for payment source

## 2018-04-05 ENCOUNTER — Other Ambulatory Visit: Payer: Self-pay | Admitting: Oncology

## 2018-04-05 ENCOUNTER — Ambulatory Visit: Payer: No Typology Code available for payment source

## 2018-04-05 VITALS — BP 130/77 | HR 81 | Temp 98.1°F | Resp 20

## 2018-04-05 DIAGNOSIS — Z5111 Encounter for antineoplastic chemotherapy: Secondary | ICD-10-CM | POA: Diagnosis not present

## 2018-04-05 DIAGNOSIS — D702 Other drug-induced agranulocytosis: Secondary | ICD-10-CM

## 2018-04-05 MED ORDER — HEPARIN SOD (PORK) LOCK FLUSH 100 UNIT/ML IV SOLN
INTRAVENOUS | Status: AC
  Filled 2018-04-05: qty 5

## 2018-04-05 MED ORDER — PEGFILGRASTIM-CBQV 6 MG/0.6ML ~~LOC~~ SOSY
6.0000 mg | PREFILLED_SYRINGE | Freq: Once | SUBCUTANEOUS | Status: AC
  Administered 2018-04-05: 6 mg via SUBCUTANEOUS
  Filled 2018-04-05: qty 0.6

## 2018-04-05 MED ORDER — SODIUM CHLORIDE 0.9 % IV SOLN
Freq: Once | INTRAVENOUS | Status: AC
  Administered 2018-04-05: 12:00:00 via INTRAVENOUS
  Filled 2018-04-05: qty 250

## 2018-04-05 MED ORDER — SODIUM CHLORIDE 0.9% FLUSH
10.0000 mL | Freq: Once | INTRAVENOUS | Status: AC | PRN
  Administered 2018-04-05: 10 mL
  Filled 2018-04-05: qty 10

## 2018-04-05 MED ORDER — HEPARIN SOD (PORK) LOCK FLUSH 100 UNIT/ML IV SOLN
500.0000 [IU] | Freq: Once | INTRAVENOUS | Status: AC | PRN
  Administered 2018-04-05: 500 [IU]

## 2018-04-05 NOTE — Addendum Note (Signed)
Addended by: Dicie Beam D on: 04/05/2018 03:08 PM   Modules accepted: Orders

## 2018-04-05 NOTE — Progress Notes (Signed)
Patient states, "I have noticed I am a little more short of breath today, even while talking. Other than this I feel really good today." Vital signs obtained and stable. 02 saturations 100% on room air. See vital sign flow sheet. MD, Dr. Grayland Ormond, notified and aware. No new orders at this time. Per MD order: proceed with scheduled infusion today. Patient instructed to notify clinic of any worsening symptoms. Patient verbalized understanding.

## 2018-04-11 ENCOUNTER — Encounter: Payer: Self-pay | Admitting: Oncology

## 2018-04-12 NOTE — Telephone Encounter (Signed)
I called patient and advised of doctor response and advised to call back with any further problems. She is going to contact the optometrist regarding her eyes

## 2018-04-16 ENCOUNTER — Encounter: Payer: Self-pay | Admitting: Oncology

## 2018-04-17 ENCOUNTER — Other Ambulatory Visit: Payer: Self-pay | Admitting: Family Medicine

## 2018-04-17 ENCOUNTER — Other Ambulatory Visit: Payer: Self-pay | Admitting: *Deleted

## 2018-04-17 MED ORDER — ALPRAZOLAM 0.25 MG PO TABS: 0.2500 mg | ORAL_TABLET | Freq: Two times a day (BID) | ORAL | 0 refills | Status: DC | PRN

## 2018-04-17 NOTE — Progress Notes (Signed)
[place]  Telephone:(336) [phone] Fax:(336) [phone]  ID: Shelby [name] OB: February 09, 1970  MR#: [id number]  AOZ#:[id number]  Patient Care Team: Jerrol Banana., MD as PCP - General (Family Medicine) [name], MD as Consulting Physician (Cardiology)  CHIEF COMPLAINT: Clinical stage IIIB triple negative invasive carcinoma of the lower inner quadrant of the left breast.  INTERVAL HISTORY: Patient returns to clinic today for further evaluation and consideration of cycle 6, day 15 of carboplatinum and Taxol.  Taxol only today.  She currently feels well and is asymptomatic.  She continues to tolerate her treatments relatively well, particularly when she receives 1 L of IV fluids 24 hours after her treatment.  She does not complain of weakness or fatigue today. She denies any recent fevers or illnesses.  She has no neurologic complaints.  She has a good appetite and denies weight loss. She has no chest pain or shortness of breath.  She denies any nausea, vomiting, constipation, or diarrhea.  She has no urinary complaints.  Patient has no specific complaints today.  REVIEW OF SYSTEMS:   Review of Systems  Constitutional: Negative.  Negative for fever, malaise/fatigue and weight loss.  HENT: Negative.  Negative for congestion, ear pain and sore throat.   Respiratory: Negative.  Negative for cough, hemoptysis and shortness of breath.   Cardiovascular: Negative.  Negative for chest pain and leg swelling.  Gastrointestinal: Negative.  Negative for abdominal pain and melena.  Genitourinary: Negative.  Negative for dysuria.  Musculoskeletal: Negative.  Negative for back pain.  Skin: Negative.  Negative for rash.  Neurological: Negative.  Negative for dizziness, focal weakness, weakness and headaches.  Psychiatric/Behavioral: The patient is nervous/anxious.     As per HPI. Otherwise, a complete review of systems is negative.  PAST MEDICAL HISTORY: Past Medical  History:  Diagnosis Date  . Anemia    h/o with pregnancy  . Anxiety   . Asthma    allergy induced-no inhaler  . Cancer ([place])   . Complication of anesthesia   . Environmental allergies   . Family history of adverse reaction to anesthesia    son-breathing problems-coded 1st time when he was 2 and had another surgery at 23 and had to be admitted for breathing problems  . Family history of breast cancer   . GERD (gastroesophageal reflux disease)   . Headache    migraines  . Hypertension   . Mitral valve disease   . Mitral valve prolapse   . Painful menstrual periods   . PONV (postoperative nausea and vomiting)   . Vertigo     PAST SURGICAL HISTORY: Past Surgical History:  Procedure Laterality Date  . ABDOMINAL HYSTERECTOMY  2010   supracervical   . BUNIONECTOMY Bilateral   . MANDIBLE RECONSTRUCTION     age 75-underbite  . PORTACATH PLACEMENT Right 12/31/2017   Procedure: INSERTION PORT-A-CATH;  Surgeon: Robert Bellow, MD;  Location: [facility] ORS;  Service: General;  Laterality: Right;  . SHOULDER ARTHROSCOPY WITH OPEN ROTATOR CUFF REPAIR Right 04/26/2017   Procedure: SHOULDER ARTHROSCOPY WITH OPEN ROTATOR CUFF REPAIR,SUBACROMINAL DECOMPRESSION;  Surgeon: Thornton Park, MD;  Location: [facility] ORS;  Service: Orthopedics;  Laterality: Right;  . TONSILLECTOMY      FAMILY HISTORY: Family History  Problem Relation Age of Onset  . Breast cancer Mother 33  . Diabetes Father   . Cancer Maternal Uncle        colon  . Breast cancer Maternal Grandmother 32    ADVANCED  DIRECTIVES (Y/N):  N  HEALTH MAINTENANCE: Social History   Tobacco Use  . Smoking status: Former Smoker    Packs/day: 0.50    Years: 10.00    Pack years: 5.00    Types: Cigarettes    Last attempt to quit: 04/20/2007    Years since quitting: 11.0  . Smokeless tobacco: Never Used  . Tobacco comment: social smoker back then  Substance Use Topics  . Alcohol use: Yes    Comment: occas  . Drug use: No      Colonoscopy:  PAP:  Bone density:  Lipid panel:  Allergies  Allergen Reactions  . Hydrocodone-Acetaminophen Hives    swollen face  . Iodine Hives  . Peanut Butter Flavor Other (See Comments)    Made mouth tingle  . Shellfish Allergy Hives    Current Outpatient Medications  Medication Sig Dispense Refill  . ALPRAZolam (XANAX) 0.25 MG tablet Take 1 tablet (0.25 mg total) by mouth 2 (two) times daily as needed for anxiety. 60 tablet 0  . Ascorbic Acid (VITAMIN C PO) Take 1 tablet by mouth daily.     . Cholecalciferol (VITAMIN D) 50 MCG (2000 UT) CAPS Take 2,000 Units by mouth daily.    Marland Kitchen EPINEPHrine 0.3 mg/0.3 mL IJ SOAJ injection Inject 0.3 mLs (0.3 mg total) into the muscle once. 1 Device 12  . famotidine (PEPCID) 20 MG tablet   0  . ibuprofen (ADVIL,MOTRIN) 200 MG tablet Take 400 mg by mouth daily as needed for moderate pain.    Marland Kitchen L-Methylfolate 15 MG TABS TAKE 1/2 TABLET (7.5 MG) BY MOUTH DAILY 45 tablet 3  . lidocaine (XYLOCAINE) 2 % jelly Apply 1 application topically as needed. For pain 30 mL 0  . lidocaine-prilocaine (EMLA) cream Apply to affected area once 30 g 3  . losartan (COZAAR) 50 MG tablet TAKE 1 TABLET BY MOUTH DAILY 90 tablet 3  . magnesium hydroxide (MILK OF MAGNESIA) 400 MG/5ML suspension Take 5 mLs by mouth daily as needed for mild constipation.    . montelukast (SINGULAIR) 10 MG tablet TAKE 1 TABLET BY MOUTH AT BEDTIME 30 tablet 12  . ondansetron (ZOFRAN) 8 MG tablet Take 1 tablet (8 mg total) by mouth 2 (two) times daily as needed. 30 tablet 2  . Probiotic CAPS Take 1 capsule by mouth at bedtime.    . prochlorperazine (COMPAZINE) 10 MG tablet Take 1 tablet (10 mg total) by mouth every 6 (six) hours as needed (Nausea or vomiting). 60 tablet 2  . tretinoin (RETIN-A) 0.1 % cream Apply 1 application topically at bedtime.    Marland Kitchen dexamethasone (DECADRON) 4 MG tablet Take 1 tablet (4 mg total) by mouth daily. (Patient not taking: Reported on 04/18/2018) 5 tablet 0  .  fluticasone (FLONASE) 50 MCG/ACT nasal spray Place 1 spray into both nostrils daily as needed for allergies or rhinitis.    . mometasone (NASONEX) 50 MCG/ACT nasal spray Place 2 sprays into the nose daily. 17 g 12   No current facility-administered medications for this visit.    Facility-Administered Medications Ordered in Other Visits  Medication Dose Route Frequency Provider Last Rate Last Dose  . famotidine (PEPCID) IVPB 20 mg premix  20 mg Intravenous Once Lloyd Huger, MD      . heparin lock flush 100 unit/mL  500 Units Intracatheter Once PRN Lloyd Huger, MD      . PACLitaxel (TAXOL) 138 mg in sodium chloride 0.9 % 250 mL chemo infusion (</= 62m/m2)  80 mg/m2 (Treatment Plan Recorded) Intravenous Once Lloyd Huger, MD        OBJECTIVE: Vitals:   04/18/18 1046  BP: 118/86  Pulse: (!) 101  Resp: 18  Temp: (!) 97.3 F (36.3 C)     Body mass index is 22.4 kg/m.    ECOG FS:0 - Asymptomatic  General: Well-developed, well-nourished, no acute distress. Eyes: Pink conjunctiva, anicteric sclera. HEENT: Normocephalic, moist mucous membranes. Breast: Near resolution of left breast mass.  Exam deferred today. Lungs: Clear to auscultation bilaterally. Heart: Regular rate and rhythm. No rubs, murmurs, or gallops. Abdomen: Soft, nontender, nondistended. No organomegaly noted, normoactive bowel sounds. Musculoskeletal: No edema, cyanosis, or clubbing. Neuro: Alert, answering all questions appropriately. Cranial nerves grossly intact. Skin: No rashes or petechiae noted. Psych: Normal affect.   LAB RESULTS:  Lab Results  Component Value Date   NA 135 04/18/2018   K 3.7 04/18/2018   CL 101 04/18/2018   CO2 25 04/18/2018   GLUCOSE 120 (H) 04/18/2018   BUN 15 04/18/2018   CREATININE 0.56 04/18/2018   CALCIUM 9.7 04/18/2018   PROT 7.2 04/18/2018   ALBUMIN 4.4 04/18/2018   AST 57 (H) 04/18/2018   ALT 77 (H) 04/18/2018   ALKPHOS 77 04/18/2018   BILITOT 0.8  04/18/2018   GFRNONAA >60 04/18/2018   GFRAA >60 04/18/2018    Lab Results  Component Value Date   WBC 3.9 (L) 04/18/2018   NEUTROABS 3.0 04/18/2018   HGB 10.9 (L) 04/18/2018   HCT 31.4 (L) 04/18/2018   MCV 101.3 (H) 04/18/2018   PLT 162 04/18/2018     STUDIES: No results found.  ASSESSMENT: Clinical stage IIIB triple negative invasive carcinoma of the lower inner quadrant of the left breast.  PLAN:    1. Clinical stage IIIB triple negative invasive carcinoma of the lower inner quadrant of the left breast: CT scan results from January 01, 2018 did not reveal any metastatic disease.  Given patient's stage of disease, she will benefit from neoadjuvant chemotherapy using Adriamycin, Cytoxan with Udenyca support followed by carboplatinum and Taxol.  She completed cycle 4 of Adriamycin and Cytoxan on February 14, 2018.  Patient has indicated she may prefer to undergo mastectomy, therefore adjuvant XRT may not be needed.  An aromatase inhibitor would not offer any benefit given the triple negative status of her disease.  Also discussed today with the possibility of using capecitabine adjuvantly if patient does not have a complete pathologic response.  MUGA scan on January 01, 2018 revealed an EF of 71%.  Proceed with cycle 6, day 15 of Taxol only today.  Return to clinic in 1 week for further evaluation and consideration of cycle 7, day 1 which will be carboplatinum and Taxol.  Patient may continue to require Udenyca periodically throughout her treatments given her history of neutropenia.  2.  Anxiety: Chronic.  Continue Xanax as needed. 3.  Anemia: Hemoglobin has improved to 10.9, monitor. 4.  Headache: Patient does not complain of this today.  Continue ibuprofen sparingly as needed. 5.  Neutropenia: Resolved.  Patient does not require Udenyca tomorrow, but may require additional doses in the future.   6.  Weakness and fatigue: Patient does not complain of this today, but will continue with IV  fluids on the Friday after her Thursday infusions. 7.  Liver enzymes: Chronic and unchanged.  AST and ALT remain mildly elevated.  Monitor. 8.  Thrombocytopenia: Resolved.  Patient expressed understanding and was in agreement with this plan.  She also understands that She can call clinic at any time with any questions, concerns, or complaints.   Cancer Staging Primary cancer of lower-inner quadrant of left female breast [facility]) Staging form: Breast, AJCC 8th Edition - Clinical stage from 12/24/2017: Stage IIIB (cT2, cN1, cM0, G3, ER-, PR-, HER2-) - Signed by Lloyd Huger, MD on 12/24/2017 - Pathologic: No stage assigned - Unsigned   Lloyd Huger, MD   04/18/2018 11:42 AM

## 2018-04-18 ENCOUNTER — Encounter: Payer: Self-pay | Admitting: *Deleted

## 2018-04-18 ENCOUNTER — Inpatient Hospital Stay (HOSPITAL_BASED_OUTPATIENT_CLINIC_OR_DEPARTMENT_OTHER): Payer: No Typology Code available for payment source | Admitting: Oncology

## 2018-04-18 ENCOUNTER — Telehealth: Payer: Self-pay | Admitting: Obstetrics and Gynecology

## 2018-04-18 ENCOUNTER — Other Ambulatory Visit: Payer: Self-pay

## 2018-04-18 ENCOUNTER — Encounter: Payer: Self-pay | Admitting: Oncology

## 2018-04-18 ENCOUNTER — Inpatient Hospital Stay: Payer: No Typology Code available for payment source

## 2018-04-18 ENCOUNTER — Telehealth: Payer: Self-pay | Admitting: Family Medicine

## 2018-04-18 VITALS — BP 118/86 | HR 101 | Temp 97.3°F | Resp 18 | Wt 138.8 lb

## 2018-04-18 DIAGNOSIS — C50312 Malignant neoplasm of lower-inner quadrant of left female breast: Secondary | ICD-10-CM

## 2018-04-18 DIAGNOSIS — Z171 Estrogen receptor negative status [ER-]: Secondary | ICD-10-CM

## 2018-04-18 DIAGNOSIS — D649 Anemia, unspecified: Secondary | ICD-10-CM

## 2018-04-18 DIAGNOSIS — F419 Anxiety disorder, unspecified: Secondary | ICD-10-CM

## 2018-04-18 DIAGNOSIS — Z5111 Encounter for antineoplastic chemotherapy: Secondary | ICD-10-CM | POA: Diagnosis not present

## 2018-04-18 DIAGNOSIS — R7989 Other specified abnormal findings of blood chemistry: Secondary | ICD-10-CM

## 2018-04-18 DIAGNOSIS — Z95828 Presence of other vascular implants and grafts: Secondary | ICD-10-CM

## 2018-04-18 DIAGNOSIS — Z87891 Personal history of nicotine dependence: Secondary | ICD-10-CM

## 2018-04-18 LAB — CBC WITH DIFFERENTIAL/PLATELET
ABS IMMATURE GRANULOCYTES: 0.01 10*3/uL (ref 0.00–0.07)
Basophils Absolute: 0 10*3/uL (ref 0.0–0.1)
Basophils Relative: 1 %
Eosinophils Absolute: 0 10*3/uL (ref 0.0–0.5)
Eosinophils Relative: 1 %
HCT: 31.4 % — ABNORMAL LOW (ref 36.0–46.0)
Hemoglobin: 10.9 g/dL — ABNORMAL LOW (ref 12.0–15.0)
Immature Granulocytes: 0 %
Lymphocytes Relative: 17 %
Lymphs Abs: 0.6 10*3/uL — ABNORMAL LOW (ref 0.7–4.0)
MCH: 35.2 pg — ABNORMAL HIGH (ref 26.0–34.0)
MCHC: 34.7 g/dL (ref 30.0–36.0)
MCV: 101.3 fL — ABNORMAL HIGH (ref 80.0–100.0)
Monocytes Absolute: 0.2 10*3/uL (ref 0.1–1.0)
Monocytes Relative: 5 %
NEUTROS ABS: 3 10*3/uL (ref 1.7–7.7)
Neutrophils Relative %: 76 %
Platelets: 162 10*3/uL (ref 150–400)
RBC: 3.1 MIL/uL — ABNORMAL LOW (ref 3.87–5.11)
RDW: 14.6 % (ref 11.5–15.5)
WBC: 3.9 10*3/uL — ABNORMAL LOW (ref 4.0–10.5)
nRBC: 0 % (ref 0.0–0.2)

## 2018-04-18 LAB — COMPREHENSIVE METABOLIC PANEL
ALT: 77 U/L — ABNORMAL HIGH (ref 0–44)
AST: 57 U/L — ABNORMAL HIGH (ref 15–41)
Albumin: 4.4 g/dL (ref 3.5–5.0)
Alkaline Phosphatase: 77 U/L (ref 38–126)
Anion gap: 9 (ref 5–15)
BUN: 15 mg/dL (ref 6–20)
CO2: 25 mmol/L (ref 22–32)
Calcium: 9.7 mg/dL (ref 8.9–10.3)
Chloride: 101 mmol/L (ref 98–111)
Creatinine, Ser: 0.56 mg/dL (ref 0.44–1.00)
GFR calc Af Amer: >60 mL/min (ref >60)
Glucose, Bld: 120 mg/dL — ABNORMAL HIGH (ref 70–99)
Potassium: 3.7 mmol/L (ref 3.5–5.1)
Sodium: 135 mmol/L (ref 135–145)
Total Bilirubin: 0.8 mg/dL (ref 0.3–1.2)
Total Protein: 7.2 g/dL (ref 6.5–8.1)

## 2018-04-18 LAB — SAMPLE TO BLOOD BANK

## 2018-04-18 MED ORDER — DIPHENHYDRAMINE HCL 50 MG/ML IJ SOLN
25.0000 mg | Freq: Once | INTRAMUSCULAR | Status: AC
  Administered 2018-04-18: 25 mg via INTRAVENOUS
  Filled 2018-04-18: qty 1

## 2018-04-18 MED ORDER — DEXAMETHASONE SODIUM PHOSPHATE 10 MG/ML IJ SOLN
10.0000 mg | Freq: Once | INTRAMUSCULAR | Status: AC
  Administered 2018-04-18: 10 mg via INTRAVENOUS
  Filled 2018-04-18: qty 1

## 2018-04-18 MED ORDER — HEPARIN SOD (PORK) LOCK FLUSH 100 UNIT/ML IV SOLN
500.0000 [IU] | Freq: Once | INTRAVENOUS | Status: AC | PRN
  Administered 2018-04-18: 500 [IU]
  Filled 2018-04-18: qty 5

## 2018-04-18 MED ORDER — SODIUM CHLORIDE 0.9 % IV SOLN
Freq: Once | INTRAVENOUS | Status: AC
  Administered 2018-04-18: 12:00:00 via INTRAVENOUS
  Filled 2018-04-18: qty 250

## 2018-04-18 MED ORDER — SODIUM CHLORIDE 0.9 % IV SOLN
80.0000 mg/m2 | Freq: Once | INTRAVENOUS | Status: AC
  Administered 2018-04-18: 138 mg via INTRAVENOUS
  Filled 2018-04-18: qty 23

## 2018-04-18 MED ORDER — MOMETASONE FUROATE 50 MCG/ACT NA SUSP: 2.0000 | Freq: Every day | NASAL | 12 refills | Status: DC

## 2018-04-18 MED ORDER — SODIUM CHLORIDE 0.9% FLUSH
10.0000 mL | Freq: Once | INTRAVENOUS | Status: AC
  Administered 2018-04-18: 10 mL via INTRAVENOUS
  Filled 2018-04-18: qty 10

## 2018-04-18 MED ORDER — FAMOTIDINE IN NACL 20-0.9 MG/50ML-% IV SOLN
20.0000 mg | Freq: Once | INTRAVENOUS | Status: AC
  Administered 2018-04-18: 20 mg via INTRAVENOUS
  Filled 2018-04-18: qty 50

## 2018-04-18 NOTE — Progress Notes (Signed)
HR 101, ok to proceed

## 2018-04-18 NOTE — Progress Notes (Signed)
Patient here for follow up. Pt complains of seasonal allergies. Pt concerned about left breast pain that started this week. Soreness to touch, achiness has relieved some.

## 2018-04-18 NOTE — Telephone Encounter (Signed)
The patient called today and stated she had not heard anything back on the refill request from [facility]; however, when I looked in her encounters, there were no calls sent back to [facility]; but, there were 2 in her file for her nurse and her PCP.  The patient then stated she would request her refills from Dr. Grayland Ormond while she was here doing her cancer treatment, and I let the patient know I was willing to send [facility] a message for the refill request, but she was sure she wanted to get with Dr. Grayland Ormond today instead, and that if she needed [place] she would call back, *NO FURTHER ACTION REQUIRED AT THIS TIME*

## 2018-04-19 ENCOUNTER — Other Ambulatory Visit: Payer: Self-pay

## 2018-04-19 ENCOUNTER — Inpatient Hospital Stay: Payer: No Typology Code available for payment source

## 2018-04-19 VITALS — BP 113/72 | HR 79 | Temp 96.6°F | Resp 18

## 2018-04-19 DIAGNOSIS — Z5111 Encounter for antineoplastic chemotherapy: Secondary | ICD-10-CM | POA: Diagnosis not present

## 2018-04-19 DIAGNOSIS — D702 Other drug-induced agranulocytosis: Secondary | ICD-10-CM

## 2018-04-19 MED ORDER — HEPARIN SOD (PORK) LOCK FLUSH 100 UNIT/ML IV SOLN
500.0000 [IU] | Freq: Once | INTRAVENOUS | Status: AC | PRN
  Administered 2018-04-19: 500 [IU]
  Filled 2018-04-19: qty 5

## 2018-04-19 MED ORDER — SODIUM CHLORIDE 0.9% FLUSH
10.0000 mL | Freq: Once | INTRAVENOUS | Status: AC | PRN
  Administered 2018-04-19: 10 mL
  Filled 2018-04-19: qty 10

## 2018-04-19 MED ORDER — SODIUM CHLORIDE 0.9 % IV SOLN
Freq: Once | INTRAVENOUS | Status: AC
  Administered 2018-04-19: 14:00:00 via INTRAVENOUS
  Filled 2018-04-19: qty 250

## 2018-04-24 ENCOUNTER — Other Ambulatory Visit: Payer: Self-pay

## 2018-04-25 ENCOUNTER — Inpatient Hospital Stay (HOSPITAL_BASED_OUTPATIENT_CLINIC_OR_DEPARTMENT_OTHER): Payer: No Typology Code available for payment source | Admitting: Oncology

## 2018-04-25 ENCOUNTER — Inpatient Hospital Stay: Payer: No Typology Code available for payment source

## 2018-04-25 ENCOUNTER — Other Ambulatory Visit: Payer: Self-pay

## 2018-04-25 ENCOUNTER — Encounter: Payer: Self-pay | Admitting: Oncology

## 2018-04-25 ENCOUNTER — Ambulatory Visit: Payer: Self-pay

## 2018-04-25 VITALS — BP 126/80 | HR 109 | Temp 98.3°F | Wt 141.5 lb

## 2018-04-25 DIAGNOSIS — D709 Neutropenia, unspecified: Secondary | ICD-10-CM | POA: Diagnosis not present

## 2018-04-25 DIAGNOSIS — Z171 Estrogen receptor negative status [ER-]: Secondary | ICD-10-CM | POA: Diagnosis not present

## 2018-04-25 DIAGNOSIS — Z87891 Personal history of nicotine dependence: Secondary | ICD-10-CM

## 2018-04-25 DIAGNOSIS — C50312 Malignant neoplasm of lower-inner quadrant of left female breast: Secondary | ICD-10-CM

## 2018-04-25 DIAGNOSIS — D649 Anemia, unspecified: Secondary | ICD-10-CM

## 2018-04-25 DIAGNOSIS — R7989 Other specified abnormal findings of blood chemistry: Secondary | ICD-10-CM

## 2018-04-25 DIAGNOSIS — F419 Anxiety disorder, unspecified: Secondary | ICD-10-CM

## 2018-04-25 DIAGNOSIS — Z5111 Encounter for antineoplastic chemotherapy: Secondary | ICD-10-CM | POA: Diagnosis not present

## 2018-04-25 LAB — COMPREHENSIVE METABOLIC PANEL
ALT: 63 U/L — ABNORMAL HIGH (ref 0–44)
AST: 44 U/L — ABNORMAL HIGH (ref 15–41)
Albumin: 4.3 g/dL (ref 3.5–5.0)
Alkaline Phosphatase: 61 U/L (ref 38–126)
Anion gap: 8 (ref 5–15)
BILIRUBIN TOTAL: 0.4 mg/dL (ref 0.3–1.2)
BUN: 14 mg/dL (ref 6–20)
CO2: 26 mmol/L (ref 22–32)
Calcium: 9.6 mg/dL (ref 8.9–10.3)
Chloride: 103 mmol/L (ref 98–111)
Creatinine, Ser: 0.46 mg/dL (ref 0.44–1.00)
GFR calc Af Amer: >60 mL/min (ref >60)
GFR calc non Af Amer: >60 mL/min (ref >60)
Glucose, Bld: 119 mg/dL — ABNORMAL HIGH (ref 70–99)
POTASSIUM: 3.6 mmol/L (ref 3.5–5.1)
Sodium: 137 mmol/L (ref 135–145)
TOTAL PROTEIN: 6.9 g/dL (ref 6.5–8.1)

## 2018-04-25 LAB — CBC WITH DIFFERENTIAL/PLATELET
Abs Immature Granulocytes: 0 10*3/uL (ref 0.00–0.07)
BASOS PCT: 1 %
Basophils Absolute: 0 10*3/uL (ref 0.0–0.1)
Eosinophils Absolute: 0.1 10*3/uL (ref 0.0–0.5)
Eosinophils Relative: 3 %
HCT: 29.4 % — ABNORMAL LOW (ref 36.0–46.0)
Hemoglobin: 10.2 g/dL — ABNORMAL LOW (ref 12.0–15.0)
Immature Granulocytes: 0 %
Lymphocytes Relative: 37 %
Lymphs Abs: 0.6 10*3/uL — ABNORMAL LOW (ref 0.7–4.0)
MCH: 35.4 pg — AB (ref 26.0–34.0)
MCHC: 34.7 g/dL (ref 30.0–36.0)
MCV: 102.1 fL — ABNORMAL HIGH (ref 80.0–100.0)
Monocytes Absolute: 0.2 10*3/uL (ref 0.1–1.0)
Monocytes Relative: 9 %
Neutro Abs: 0.9 10*3/uL — ABNORMAL LOW (ref 1.7–7.7)
Neutrophils Relative %: 50 %
PLATELETS: 232 10*3/uL (ref 150–400)
RBC: 2.88 MIL/uL — AB (ref 3.87–5.11)
RDW: 12.6 % (ref 11.5–15.5)
WBC: 1.8 10*3/uL — AB (ref 4.0–10.5)
nRBC: 0 % (ref 0.0–0.2)

## 2018-04-25 LAB — SAMPLE TO BLOOD BANK

## 2018-04-25 MED ORDER — SODIUM CHLORIDE 0.9% FLUSH
10.0000 mL | INTRAVENOUS | Status: DC | PRN
  Administered 2018-04-25: 10 mL via INTRAVENOUS
  Filled 2018-04-25: qty 10

## 2018-04-25 MED ORDER — HEPARIN SOD (PORK) LOCK FLUSH 100 UNIT/ML IV SOLN
500.0000 [IU] | Freq: Once | INTRAVENOUS | Status: AC
  Administered 2018-04-25: 500 [IU] via INTRAVENOUS
  Filled 2018-04-25: qty 5

## 2018-04-25 NOTE — Progress Notes (Signed)
Patient here today for follow up.  Patient c/o of some SOb with exertion.

## 2018-04-25 NOTE — Progress Notes (Signed)
[place]  Telephone:(336) [phone] Fax:(336) [phone]  ID: Carolan Clines OB: [birth year]  MR#: [id number]  HYI#:[id number]  Patient Care Team: Jerrol Banana., MD as PCP - General (Family Medicine) [name], MD as Consulting Physician (Cardiology)  CHIEF COMPLAINT: Clinical stage IIIB triple negative invasive carcinoma of the lower inner quadrant of the left breast.  INTERVAL HISTORY: Patient returns to clinic today for further evaluation and consideration of cycle 7, day 1 of carboplatinum and Taxol. She currently feels well and is asymptomatic.  She continues to be highly anxious.  She is tolerating her treatments relatively well, particularly when she receives 1 L of IV fluids 24 hours after her treatment.  She does not complain of weakness or fatigue today. She denies any recent fevers or illnesses.  She has no neurologic complaints.  She has a good appetite and denies weight loss. She has no chest pain or shortness of breath.  She denies any nausea, vomiting, constipation, or diarrhea.  She has no urinary complaints.  Patient offers no specific complaints today.  REVIEW OF SYSTEMS:   Review of Systems  Constitutional: Negative.  Negative for fever, malaise/fatigue and weight loss.  HENT: Negative.  Negative for congestion, ear pain and sore throat.   Respiratory: Negative.  Negative for cough, hemoptysis and shortness of breath.   Cardiovascular: Negative.  Negative for chest pain and leg swelling.  Gastrointestinal: Negative.  Negative for abdominal pain and melena.  Genitourinary: Negative.  Negative for dysuria.  Musculoskeletal: Negative.  Negative for back pain.  Skin: Negative.  Negative for rash.  Neurological: Negative.  Negative for dizziness, focal weakness, weakness and headaches.  Psychiatric/Behavioral: The patient is nervous/anxious.     As per HPI. Otherwise, a complete review of systems is negative.  PAST MEDICAL HISTORY:  Past Medical History:  Diagnosis Date  . Anemia    h/o with pregnancy  . Anxiety   . Asthma    allergy induced-no inhaler  . Cancer ([place])   . Complication of anesthesia   . Environmental allergies   . Family history of adverse reaction to anesthesia    son-breathing problems-coded 1st time when he was 2 and had another surgery at 47 and had to be admitted for breathing problems  . Family history of breast cancer   . GERD (gastroesophageal reflux disease)   . Headache    migraines  . Hypertension   . Mitral valve disease   . Mitral valve prolapse   . Painful menstrual periods   . PONV (postoperative nausea and vomiting)   . Vertigo     PAST SURGICAL HISTORY: Past Surgical History:  Procedure Laterality Date  . ABDOMINAL HYSTERECTOMY  2010   supracervical   . BUNIONECTOMY Bilateral   . MANDIBLE RECONSTRUCTION     age 44-underbite  . PORTACATH PLACEMENT Right 12/31/2017   Procedure: INSERTION PORT-A-CATH;  Surgeon: Robert Bellow, MD;  Location: [facility] ORS;  Service: General;  Laterality: Right;  . SHOULDER ARTHROSCOPY WITH OPEN ROTATOR CUFF REPAIR Right 04/26/2017   Procedure: SHOULDER ARTHROSCOPY WITH OPEN ROTATOR CUFF REPAIR,SUBACROMINAL DECOMPRESSION;  Surgeon: Thornton Park, MD;  Location: [facility] ORS;  Service: Orthopedics;  Laterality: Right;  . TONSILLECTOMY      FAMILY HISTORY: Family History  Problem Relation Age of Onset  . Breast cancer Mother 61  . Diabetes Father   . Cancer Maternal Uncle        colon  . Breast cancer Maternal Grandmother 76  ADVANCED DIRECTIVES (Y/N):  N  HEALTH MAINTENANCE: Social History   Tobacco Use  . Smoking status: Former Smoker    Packs/day: 0.50    Years: 10.00    Pack years: 5.00    Types: Cigarettes    Last attempt to quit: 04/20/2007    Years since quitting: 11.0  . Smokeless tobacco: Never Used  . Tobacco comment: social smoker back then  Substance Use Topics  . Alcohol use: Yes    Comment: occas  . Drug  use: No     Colonoscopy:  PAP:  Bone density:  Lipid panel:  Allergies  Allergen Reactions  . Hydrocodone-Acetaminophen Hives    swollen face  . Iodine Hives  . Peanut Butter Flavor Other (See Comments)    Made mouth tingle  . Shellfish Allergy Hives    Current Outpatient Medications  Medication Sig Dispense Refill  . ALPRAZolam (XANAX) 0.25 MG tablet Take 1 tablet (0.25 mg total) by mouth 2 (two) times daily as needed for anxiety. 60 tablet 0  . Ascorbic Acid (VITAMIN C PO) Take 1 tablet by mouth daily.     . Cholecalciferol (VITAMIN D) 50 MCG (2000 UT) CAPS Take 2,000 Units by mouth daily.    Marland Kitchen dexamethasone (DECADRON) 4 MG tablet Take 1 tablet (4 mg total) by mouth daily. 5 tablet 0  . EPINEPHrine 0.3 mg/0.3 mL IJ SOAJ injection Inject 0.3 mLs (0.3 mg total) into the muscle once. 1 Device 12  . famotidine (PEPCID) 20 MG tablet   0  . fluticasone (FLONASE) 50 MCG/ACT nasal spray Place 1 spray into both nostrils daily as needed for allergies or rhinitis.    Marland Kitchen ibuprofen (ADVIL,MOTRIN) 200 MG tablet Take 400 mg by mouth daily as needed for moderate pain.    Marland Kitchen L-Methylfolate 15 MG TABS TAKE 1/2 TABLET (7.5 MG) BY MOUTH DAILY 45 tablet 3  . lidocaine (XYLOCAINE) 2 % jelly Apply 1 application topically as needed. For pain 30 mL 0  . lidocaine-prilocaine (EMLA) cream Apply to affected area once 30 g 3  . losartan (COZAAR) 50 MG tablet TAKE 1 TABLET BY MOUTH DAILY 90 tablet 3  . magnesium hydroxide (MILK OF MAGNESIA) 400 MG/5ML suspension Take 5 mLs by mouth daily as needed for mild constipation.    . mometasone (NASONEX) 50 MCG/ACT nasal spray Place 2 sprays into the nose daily. 17 g 12  . montelukast (SINGULAIR) 10 MG tablet TAKE 1 TABLET BY MOUTH AT BEDTIME 30 tablet 12  . ondansetron (ZOFRAN) 8 MG tablet Take 1 tablet (8 mg total) by mouth 2 (two) times daily as needed. 30 tablet 2  . Probiotic CAPS Take 1 capsule by mouth at bedtime.    . prochlorperazine (COMPAZINE) 10 MG tablet  Take 1 tablet (10 mg total) by mouth every 6 (six) hours as needed (Nausea or vomiting). 60 tablet 2  . tretinoin (RETIN-A) 0.1 % cream Apply 1 application topically at bedtime.     No current facility-administered medications for this visit.    Facility-Administered Medications Ordered in Other Visits  Medication Dose Route Frequency Provider Last Rate Last Dose  . sodium chloride flush (NS) 0.9 % injection 10 mL  10 mL Intravenous PRN Lloyd Huger, MD   10 mL at 04/25/18 0954    OBJECTIVE: Vitals:   04/25/18 1008  BP: 126/80  Pulse: (!) 109  Temp: 98.3 F (36.8 C)     Body mass index is 22.84 kg/m.    ECOG FS:0 -  Asymptomatic  General: Well-developed, well-nourished, no acute distress. Eyes: Pink conjunctiva, anicteric sclera. HEENT: Normocephalic, moist mucous membranes. Breast: Exam deferred today. Lungs: Clear to auscultation bilaterally. Heart: Regular rate and rhythm. No rubs, murmurs, or gallops. Abdomen: Soft, nontender, nondistended. No organomegaly noted, normoactive bowel sounds. Musculoskeletal: No edema, cyanosis, or clubbing. Neuro: Alert, answering all questions appropriately. Cranial nerves grossly intact. Skin: No rashes or petechiae noted. Psych: Normal affect.  LAB RESULTS:  Lab Results  Component Value Date   NA 137 04/25/2018   K 3.6 04/25/2018   CL 103 04/25/2018   CO2 26 04/25/2018   GLUCOSE 119 (H) 04/25/2018   BUN 14 04/25/2018   CREATININE 0.46 04/25/2018   CALCIUM 9.6 04/25/2018   PROT 6.9 04/25/2018   ALBUMIN 4.3 04/25/2018   AST 44 (H) 04/25/2018   ALT 63 (H) 04/25/2018   ALKPHOS 61 04/25/2018   BILITOT 0.4 04/25/2018   GFRNONAA >60 04/25/2018   GFRAA >60 04/25/2018    Lab Results  Component Value Date   WBC 1.8 (L) 04/25/2018   NEUTROABS 0.9 (L) 04/25/2018   HGB 10.2 (L) 04/25/2018   HCT 29.4 (L) 04/25/2018   MCV 102.1 (H) 04/25/2018   PLT 232 04/25/2018     STUDIES: No results found.  ASSESSMENT: Clinical stage  IIIB triple negative invasive carcinoma of the lower inner quadrant of the left breast.  PLAN:    1. Clinical stage IIIB triple negative invasive carcinoma of the lower inner quadrant of the left breast: CT scan results from January 01, 2018 did not reveal any metastatic disease.  Given patient's stage of disease, she will benefit from neoadjuvant chemotherapy using Adriamycin, Cytoxan with Udenyca support followed by carboplatinum and Taxol.  She completed cycle 4 of Adriamycin and Cytoxan on February 14, 2018.  Patient has indicated she may prefer to undergo mastectomy, therefore adjuvant XRT may not be needed.  An aromatase inhibitor would not offer any benefit given the triple negative status of her disease.  Also discussed today with the possibility of using capecitabine adjuvantly if patient does not have a complete pathologic response.  MUGA scan on January 01, 2018 revealed an EF of 71%.  Delay cycle 7, day 1 of carboplatinum and Taxol today secondary to neutropenia.  Return to clinic in 1 week for further evaluation and reconsideration of treatment.  Patient will receive a Udenyca with IV fluids on day 2 next week.  She would then get an extra week off prior to initiating cycle 7, day 8.   2.  Anxiety: Chronic.  Continue Xanax as needed. 3.  Anemia: Hemoglobin decreased, but relatively stable at 10.2. 4.  Headache: Patient does not complain of this today.  Continue ibuprofen sparingly as needed. 5.  Neutropenia: Delay treatment as above.  Patient will receive a Udenyca after her next treatment. 6.  Weakness and fatigue: Patient does not complain of this today, but will continue with IV fluids on the Friday after her Thursday infusions. 7.  Liver enzymes: Chronic and unchanged.  AST and ALT remain mildly elevated.  Monitor. 8.  Thrombocytopenia: Resolved.  Patient expressed understanding and was in agreement with this plan. She also understands that She can call clinic at any time with any  questions, concerns, or complaints.   Cancer Staging Primary cancer of lower-inner quadrant of left female breast [facility]) Staging form: Breast, AJCC 8th Edition - Clinical stage from 12/24/2017: Stage IIIB (cT2, cN1, cM0, G3, ER-, PR-, HER2-) - Signed by Lloyd Huger, MD  on 12/24/2017 - Pathologic: No stage assigned - Unsigned   Lloyd Huger, MD   04/25/2018 3:40 PM

## 2018-04-26 ENCOUNTER — Inpatient Hospital Stay: Payer: No Typology Code available for payment source

## 2018-05-02 ENCOUNTER — Encounter: Payer: Self-pay | Admitting: Oncology

## 2018-05-02 ENCOUNTER — Inpatient Hospital Stay: Payer: No Typology Code available for payment source | Attending: Oncology

## 2018-05-02 ENCOUNTER — Inpatient Hospital Stay (HOSPITAL_BASED_OUTPATIENT_CLINIC_OR_DEPARTMENT_OTHER): Payer: No Typology Code available for payment source | Admitting: Oncology

## 2018-05-02 ENCOUNTER — Telehealth: Payer: Self-pay | Admitting: *Deleted

## 2018-05-02 ENCOUNTER — Inpatient Hospital Stay: Payer: No Typology Code available for payment source

## 2018-05-02 ENCOUNTER — Other Ambulatory Visit: Payer: Self-pay

## 2018-05-02 VITALS — BP 133/87 | HR 98 | Temp 97.9°F | Wt 143.0 lb

## 2018-05-02 DIAGNOSIS — R740 Nonspecific elevation of levels of transaminase and lactic acid dehydrogenase [LDH]: Secondary | ICD-10-CM | POA: Insufficient documentation

## 2018-05-02 DIAGNOSIS — C50312 Malignant neoplasm of lower-inner quadrant of left female breast: Secondary | ICD-10-CM | POA: Insufficient documentation

## 2018-05-02 DIAGNOSIS — D709 Neutropenia, unspecified: Secondary | ICD-10-CM | POA: Insufficient documentation

## 2018-05-02 DIAGNOSIS — Z5189 Encounter for other specified aftercare: Secondary | ICD-10-CM | POA: Diagnosis not present

## 2018-05-02 DIAGNOSIS — Z5111 Encounter for antineoplastic chemotherapy: Secondary | ICD-10-CM | POA: Insufficient documentation

## 2018-05-02 DIAGNOSIS — D649 Anemia, unspecified: Secondary | ICD-10-CM

## 2018-05-02 DIAGNOSIS — Z87891 Personal history of nicotine dependence: Secondary | ICD-10-CM

## 2018-05-02 DIAGNOSIS — F419 Anxiety disorder, unspecified: Secondary | ICD-10-CM

## 2018-05-02 DIAGNOSIS — Z171 Estrogen receptor negative status [ER-]: Secondary | ICD-10-CM

## 2018-05-02 DIAGNOSIS — Z95828 Presence of other vascular implants and grafts: Secondary | ICD-10-CM

## 2018-05-02 LAB — CBC WITH DIFFERENTIAL/PLATELET
Abs Immature Granulocytes: 0.01 10*3/uL (ref 0.00–0.07)
Basophils Absolute: 0 10*3/uL (ref 0.0–0.1)
Basophils Relative: 1 %
Eosinophils Absolute: 0.1 10*3/uL (ref 0.0–0.5)
Eosinophils Relative: 4 %
HCT: 32 % — ABNORMAL LOW (ref 36.0–46.0)
Hemoglobin: 10.9 g/dL — ABNORMAL LOW (ref 12.0–15.0)
Immature Granulocytes: 1 %
Lymphocytes Relative: 38 %
Lymphs Abs: 0.7 10*3/uL (ref 0.7–4.0)
MCH: 34.5 pg — ABNORMAL HIGH (ref 26.0–34.0)
MCHC: 34.1 g/dL (ref 30.0–36.0)
MCV: 101.3 fL — ABNORMAL HIGH (ref 80.0–100.0)
Monocytes Absolute: 0.2 10*3/uL (ref 0.1–1.0)
Monocytes Relative: 13 %
Neutro Abs: 0.8 10*3/uL — ABNORMAL LOW (ref 1.7–7.7)
Neutrophils Relative %: 43 %
Platelets: 167 10*3/uL (ref 150–400)
RBC: 3.16 MIL/uL — ABNORMAL LOW (ref 3.87–5.11)
RDW: 11.9 % (ref 11.5–15.5)
WBC: 1.7 10*3/uL — ABNORMAL LOW (ref 4.0–10.5)
nRBC: 0 % (ref 0.0–0.2)

## 2018-05-02 LAB — COMPREHENSIVE METABOLIC PANEL
ALT: 61 U/L — ABNORMAL HIGH (ref 0–44)
AST: 47 U/L — ABNORMAL HIGH (ref 15–41)
Albumin: 4.3 g/dL (ref 3.5–5.0)
Alkaline Phosphatase: 66 U/L (ref 38–126)
Anion gap: 7 (ref 5–15)
BUN: 10 mg/dL (ref 6–20)
CO2: 26 mmol/L (ref 22–32)
Calcium: 9.5 mg/dL (ref 8.9–10.3)
Chloride: 103 mmol/L (ref 98–111)
Creatinine, Ser: 0.47 mg/dL (ref 0.44–1.00)
GFR calc Af Amer: >60 mL/min (ref >60)
GFR calc non Af Amer: >60 mL/min (ref >60)
Glucose, Bld: 93 mg/dL (ref 70–99)
Potassium: 3.6 mmol/L (ref 3.5–5.1)
Sodium: 136 mmol/L (ref 135–145)
Total Bilirubin: 0.6 mg/dL (ref 0.3–1.2)
Total Protein: 7 g/dL (ref 6.5–8.1)

## 2018-05-02 LAB — SAMPLE TO BLOOD BANK

## 2018-05-02 MED ORDER — FAMOTIDINE IN NACL 20-0.9 MG/50ML-% IV SOLN
20.0000 mg | Freq: Once | INTRAVENOUS | Status: AC
  Administered 2018-05-02: 20 mg via INTRAVENOUS
  Filled 2018-05-02: qty 50

## 2018-05-02 MED ORDER — SODIUM CHLORIDE 0.9 % IV SOLN
Freq: Once | INTRAVENOUS | Status: AC
  Administered 2018-05-02: 11:00:00 via INTRAVENOUS
  Filled 2018-05-02: qty 250

## 2018-05-02 MED ORDER — SODIUM CHLORIDE 0.9 % IV SOLN
680.0000 mg | Freq: Once | INTRAVENOUS | Status: AC
  Administered 2018-05-02: 14:00:00 680 mg via INTRAVENOUS
  Filled 2018-05-02: qty 68

## 2018-05-02 MED ORDER — SODIUM CHLORIDE 0.9 % IV SOLN
Freq: Once | INTRAVENOUS | Status: AC
  Administered 2018-05-02: 12:00:00 via INTRAVENOUS
  Filled 2018-05-02: qty 5

## 2018-05-02 MED ORDER — SODIUM CHLORIDE 0.9 % IV SOLN
80.0000 mg/m2 | Freq: Once | INTRAVENOUS | Status: AC
  Administered 2018-05-02: 138 mg via INTRAVENOUS
  Filled 2018-05-02: qty 23

## 2018-05-02 MED ORDER — PALONOSETRON HCL INJECTION 0.25 MG/5ML
0.2500 mg | Freq: Once | INTRAVENOUS | Status: AC
  Administered 2018-05-02: 11:00:00 0.25 mg via INTRAVENOUS
  Filled 2018-05-02: qty 5

## 2018-05-02 MED ORDER — SODIUM CHLORIDE 0.9% FLUSH
10.0000 mL | Freq: Once | INTRAVENOUS | Status: AC
  Administered 2018-05-02: 10 mL via INTRAVENOUS
  Filled 2018-05-02: qty 10

## 2018-05-02 MED ORDER — DEXAMETHASONE SODIUM PHOSPHATE 10 MG/ML IJ SOLN: 10.0000 mg | Freq: Once | INTRAMUSCULAR | Status: DC

## 2018-05-02 MED ORDER — SODIUM CHLORIDE 0.9 % IV SOLN
Freq: Once | INTRAVENOUS | Status: DC
  Filled 2018-05-02: qty 100

## 2018-05-02 MED ORDER — DIPHENHYDRAMINE HCL 50 MG/ML IJ SOLN
25.0000 mg | Freq: Once | INTRAMUSCULAR | Status: AC
  Administered 2018-05-02: 11:00:00 25 mg via INTRAVENOUS
  Filled 2018-05-02: qty 1

## 2018-05-02 MED ORDER — HEPARIN SOD (PORK) LOCK FLUSH 100 UNIT/ML IV SOLN
500.0000 [IU] | Freq: Once | INTRAVENOUS | Status: AC | PRN
  Administered 2018-05-02: 500 [IU]

## 2018-05-02 NOTE — Progress Notes (Signed)
[place]  Telephone:(336) [phone] Fax:(336) [phone]  ID: Carolan Clines OB: [birth year]  MR#: [id number]  DBZ#:[id number]  Patient Care Team: Jerrol Banana., MD as PCP - General (Family Medicine) [name], MD as Consulting Physician (Cardiology)  CHIEF COMPLAINT: Clinical stage IIIB triple negative invasive carcinoma of the lower inner quadrant of the left breast.  INTERVAL HISTORY: Patient returns to clinic today for further evaluation and reconsideration of cycle 7, day 1 of carboplatinum and Taxol.  She continues to feel well and remains asymptomatic. She continues to be highly anxious.  She is tolerating her treatments relatively well, particularly when she receives 1 L of IV fluids 24 hours after her treatment.  She does not complain of weakness or fatigue today. She denies any recent fevers or illnesses.  She has no neurologic complaints.  She has a good appetite and denies weight loss.  She denies any chest pain, shortness of breath, cough, or hemoptysis.  She denies any nausea, vomiting, constipation, or diarrhea.  She has no urinary complaints.  Patient offers no specific complaints today.  REVIEW OF SYSTEMS:   Review of Systems  Constitutional: Negative.  Negative for fever, malaise/fatigue and weight loss.  HENT: Negative.  Negative for congestion, ear pain and sore throat.   Respiratory: Negative.  Negative for cough, hemoptysis and shortness of breath.   Cardiovascular: Negative.  Negative for chest pain and leg swelling.  Gastrointestinal: Negative.  Negative for abdominal pain and melena.  Genitourinary: Negative.  Negative for dysuria.  Musculoskeletal: Negative.  Negative for back pain.  Skin: Negative.  Negative for rash.  Neurological: Negative.  Negative for dizziness, focal weakness, weakness and headaches.  Psychiatric/Behavioral: The patient is nervous/anxious.     As per HPI. Otherwise, a complete review of systems is  negative.  PAST MEDICAL HISTORY: Past Medical History:  Diagnosis Date  . Anemia    h/o with pregnancy  . Anxiety   . Asthma    allergy induced-no inhaler  . Cancer ([place])   . Complication of anesthesia   . Environmental allergies   . Family history of adverse reaction to anesthesia    son-breathing problems-coded 1st time when he was 50 and had another surgery at 50 and had to be admitted for breathing problems  . Family history of breast cancer   . GERD (gastroesophageal reflux disease)   . Headache    migraines  . Hypertension   . Mitral valve disease   . Mitral valve prolapse   . Painful menstrual periods   . PONV (postoperative nausea and vomiting)   . Vertigo     PAST SURGICAL HISTORY: Past Surgical History:  Procedure Laterality Date  . ABDOMINAL HYSTERECTOMY  2010   supracervical   . BUNIONECTOMY Bilateral   . MANDIBLE RECONSTRUCTION     age 48-underbite  . PORTACATH PLACEMENT Right 12/31/2017   Procedure: INSERTION PORT-A-CATH;  Surgeon: Robert Bellow, MD;  Location: [facility] ORS;  Service: General;  Laterality: Right;  . SHOULDER ARTHROSCOPY WITH OPEN ROTATOR CUFF REPAIR Right 04/26/2017   Procedure: SHOULDER ARTHROSCOPY WITH OPEN ROTATOR CUFF REPAIR,SUBACROMINAL DECOMPRESSION;  Surgeon: Thornton Park, MD;  Location: [facility] ORS;  Service: Orthopedics;  Laterality: Right;  . TONSILLECTOMY      FAMILY HISTORY: Family History  Problem Relation Age of Onset  . Breast cancer Mother 41  . Diabetes Father   . Cancer Maternal Uncle        colon  . Breast cancer Maternal Grandmother  4    ADVANCED DIRECTIVES (Y/N):  N  HEALTH MAINTENANCE: Social History   Tobacco Use  . Smoking status: Former Smoker    Packs/day: 0.50    Years: 10.00    Pack years: 5.00    Types: Cigarettes    Last attempt to quit: 04/20/2007    Years since quitting: 11.0  . Smokeless tobacco: Never Used  . Tobacco comment: social smoker back then  Substance Use Topics  . Alcohol use:  Yes    Comment: occas  . Drug use: No     Colonoscopy:  PAP:  Bone density:  Lipid panel:  Allergies  Allergen Reactions  . Hydrocodone-Acetaminophen Hives    swollen face  . Iodine Hives  . Peanut Butter Flavor Other (See Comments)    Made mouth tingle  . Shellfish Allergy Hives    Current Outpatient Medications  Medication Sig Dispense Refill  . ALPRAZolam (XANAX) 0.25 MG tablet Take 1 tablet (0.25 mg total) by mouth 2 (two) times daily as needed for anxiety. 60 tablet 0  . Ascorbic Acid (VITAMIN C PO) Take 1 tablet by mouth daily.     . Cholecalciferol (VITAMIN D) 50 MCG (2000 UT) CAPS Take 2,000 Units by mouth daily.    Marland Kitchen dexamethasone (DECADRON) 4 MG tablet Take 1 tablet (4 mg total) by mouth daily. 5 tablet 0  . EPINEPHrine 0.3 mg/0.3 mL IJ SOAJ injection Inject 0.3 mLs (0.3 mg total) into the muscle once. 1 Device 12  . famotidine (PEPCID) 20 MG tablet   0  . fluticasone (FLONASE) 50 MCG/ACT nasal spray Place 1 spray into both nostrils daily as needed for allergies or rhinitis.    Marland Kitchen ibuprofen (ADVIL,MOTRIN) 200 MG tablet Take 400 mg by mouth daily as needed for moderate pain.    Marland Kitchen L-Methylfolate 15 MG TABS TAKE 1/2 TABLET (7.5 MG) BY MOUTH DAILY 45 tablet 3  . lidocaine (XYLOCAINE) 2 % jelly Apply 1 application topically as needed. For pain 30 mL 0  . lidocaine-prilocaine (EMLA) cream Apply to affected area once 30 g 3  . losartan (COZAAR) 50 MG tablet TAKE 1 TABLET BY MOUTH DAILY 90 tablet 3  . magnesium hydroxide (MILK OF MAGNESIA) 400 MG/5ML suspension Take 5 mLs by mouth daily as needed for mild constipation.    . mometasone (NASONEX) 50 MCG/ACT nasal spray Place 2 sprays into the nose daily. 17 g 12  . montelukast (SINGULAIR) 10 MG tablet TAKE 1 TABLET BY MOUTH AT BEDTIME 30 tablet 12  . ondansetron (ZOFRAN) 8 MG tablet Take 1 tablet (8 mg total) by mouth 2 (two) times daily as needed. 30 tablet 2  . Probiotic CAPS Take 1 capsule by mouth at bedtime.    .  prochlorperazine (COMPAZINE) 10 MG tablet Take 1 tablet (10 mg total) by mouth every 6 (six) hours as needed (Nausea or vomiting). 60 tablet 2  . tretinoin (RETIN-A) 0.1 % cream Apply 1 application topically at bedtime.     No current facility-administered medications for this visit.     OBJECTIVE: Vitals:   05/02/18 0954  BP: 133/87  Pulse: 98  Temp: 97.9 F (36.6 C)     Body mass index is 23.08 kg/m.    ECOG FS:0 - Asymptomatic  General: Well-developed, well-nourished, no acute distress. Eyes: Pink conjunctiva, anicteric sclera. HEENT: Normocephalic, moist mucous membranes. Breast: Exam deferred today. Lungs: Clear to auscultation bilaterally. Heart: Regular rate and rhythm. No rubs, murmurs, or gallops. Abdomen: Soft, nontender, nondistended. No  organomegaly noted, normoactive bowel sounds. Musculoskeletal: No edema, cyanosis, or clubbing. Neuro: Alert, answering all questions appropriately. Cranial nerves grossly intact. Skin: No rashes or petechiae noted. Psych: Normal affect.  LAB RESULTS:  Lab Results  Component Value Date   NA 136 05/02/2018   K 3.6 05/02/2018   CL 103 05/02/2018   CO2 26 05/02/2018   GLUCOSE 93 05/02/2018   BUN 10 05/02/2018   CREATININE 0.47 05/02/2018   CALCIUM 9.5 05/02/2018   PROT 7.0 05/02/2018   ALBUMIN 4.3 05/02/2018   AST 47 (H) 05/02/2018   ALT 61 (H) 05/02/2018   ALKPHOS 66 05/02/2018   BILITOT 0.6 05/02/2018   GFRNONAA >60 05/02/2018   GFRAA >60 05/02/2018    Lab Results  Component Value Date   WBC 1.7 (L) 05/02/2018   NEUTROABS 0.8 (L) 05/02/2018   HGB 10.9 (L) 05/02/2018   HCT 32.0 (L) 05/02/2018   MCV 101.3 (H) 05/02/2018   PLT 167 05/02/2018     STUDIES: No results found.  ASSESSMENT: Clinical stage IIIB triple negative invasive carcinoma of the lower inner quadrant of the left breast.  PLAN:    1. Clinical stage IIIB triple negative invasive carcinoma of the lower inner quadrant of the left breast: CT scan  results from January 01, 2018 did not reveal any metastatic disease.  Given patient's stage of disease, she will benefit from neoadjuvant chemotherapy using Adriamycin, Cytoxan with Udenyca support followed by carboplatinum and Taxol.  She completed cycle 4 of Adriamycin and Cytoxan on February 14, 2018.  Patient has indicated she may prefer to undergo mastectomy, therefore adjuvant XRT may not be needed.  An aromatase inhibitor would not offer any benefit given the triple negative status of her disease.  Also discussed today with the possibility of using capecitabine adjuvantly if patient does not have a complete pathologic response.  MUGA scan on January 01, 2018 revealed an EF of 71%.  Despite neutropenia, will proceed with cycle 7, day 1 of carboplatinum and Taxol today.  Patient will return to clinic tomorrow for IV fluids and Udenyca.  She would then return to clinic in 2 weeks for further evaluation and consideration of cycle 7, day 8 which will be Taxol only.   2.  Anxiety: Chronic.  Continue Xanax as needed. 3.  Anemia: Hemoglobin decreased, but slightly improved to 10.9. 4.  Headache: Patient does not complain of this today.  Continue ibuprofen sparingly as needed. 5.  Neutropenia: Proceed cautiously with treatment above.  Udenyca tomorrow. 6.  Weakness and fatigue: Patient does not complain of this today, but will continue with IV fluids on the Friday after her Thursday infusions. 7.  Liver enzymes: Chronic and unchanged.  AST and ALT remain mildly elevated.  Monitor. 8.  Thrombocytopenia: Resolved.  Patient expressed understanding and was in agreement with this plan. She also understands that She can call clinic at any time with any questions, concerns, or complaints.   Cancer Staging Primary cancer of lower-inner quadrant of left female breast [facility]) Staging form: Breast, AJCC 8th Edition - Clinical stage from 12/24/2017: Stage IIIB (cT2, cN1, cM0, G3, ER-, PR-, HER2-) - Signed by Lloyd Huger, MD on 12/24/2017 - Pathologic: No stage assigned - Unsigned   Lloyd Huger, MD   05/04/2018 6:59 AM

## 2018-05-02 NOTE — Progress Notes (Signed)
Patient stated that she had been doing well with no complaints. 

## 2018-05-02 NOTE — Telephone Encounter (Signed)
Incoming call from patient-she received iv benadryl today in the clinic. Pt requesting if she could take 1 tablet of benadryl at bedtime to help her sleep. Dose of benadryl received in the clinic today was 25 mg. I explained to her that she could take the dose at bedtime around 8 pm. She also wanted to take her xanax as well for anxiety at bedtime. However, to be mindful that benadryl and xanax could cause drowsiness and to be careful getting out of the bed at night. Pt also advised the the benadryl may also cause restlessness. She states that she may hold off on the xanax tonight to see how she does. She thanked me for assisting her with this information.

## 2018-05-03 ENCOUNTER — Inpatient Hospital Stay: Payer: No Typology Code available for payment source

## 2018-05-03 ENCOUNTER — Other Ambulatory Visit: Payer: Self-pay

## 2018-05-03 VITALS — BP 117/73 | HR 80 | Temp 97.9°F

## 2018-05-03 DIAGNOSIS — Z5111 Encounter for antineoplastic chemotherapy: Secondary | ICD-10-CM | POA: Diagnosis not present

## 2018-05-03 DIAGNOSIS — D702 Other drug-induced agranulocytosis: Secondary | ICD-10-CM

## 2018-05-03 MED ORDER — HEPARIN SOD (PORK) LOCK FLUSH 100 UNIT/ML IV SOLN
500.0000 [IU] | Freq: Once | INTRAVENOUS | Status: AC | PRN
  Administered 2018-05-03: 500 [IU]
  Filled 2018-05-03: qty 5

## 2018-05-03 MED ORDER — PEGFILGRASTIM-CBQV 6 MG/0.6ML ~~LOC~~ SOSY
6.0000 mg | PREFILLED_SYRINGE | Freq: Once | SUBCUTANEOUS | Status: AC
  Administered 2018-05-03: 6 mg via SUBCUTANEOUS
  Filled 2018-05-03: qty 0.6

## 2018-05-03 MED ORDER — SODIUM CHLORIDE 0.9 % IV SOLN
Freq: Once | INTRAVENOUS | Status: AC
  Administered 2018-05-03: 14:00:00 via INTRAVENOUS
  Filled 2018-05-03: qty 250

## 2018-05-03 MED ORDER — SODIUM CHLORIDE 0.9% FLUSH
10.0000 mL | Freq: Once | INTRAVENOUS | Status: AC | PRN
  Administered 2018-05-03: 10 mL
  Filled 2018-05-03: qty 10

## 2018-05-06 ENCOUNTER — Encounter: Payer: Self-pay | Admitting: *Deleted

## 2018-05-06 ENCOUNTER — Other Ambulatory Visit: Payer: Self-pay | Admitting: Oncology

## 2018-05-06 NOTE — Patient Instructions (Signed)
FMLA forms faxed per request of [employer] with updated return to work date of 07/01/18.

## 2018-05-07 ENCOUNTER — Encounter: Payer: Self-pay | Admitting: Oncology

## 2018-05-07 MED FILL — LOSARTAN POTASSIUM 50 MG TA: 50 | 30 days supply | Qty: 30 | Fill #0

## 2018-05-07 MED FILL — LIDOCAINE-PRILOCAINE CREAM: 2.5-2.5 | 7 days supply | Qty: 30 | Fill #0

## 2018-05-15 ENCOUNTER — Other Ambulatory Visit: Payer: Self-pay

## 2018-05-16 ENCOUNTER — Inpatient Hospital Stay (HOSPITAL_BASED_OUTPATIENT_CLINIC_OR_DEPARTMENT_OTHER): Payer: No Typology Code available for payment source | Admitting: Oncology

## 2018-05-16 ENCOUNTER — Other Ambulatory Visit: Payer: Self-pay

## 2018-05-16 ENCOUNTER — Encounter: Payer: Self-pay | Admitting: Oncology

## 2018-05-16 ENCOUNTER — Inpatient Hospital Stay: Payer: No Typology Code available for payment source

## 2018-05-16 VITALS — BP 136/89 | HR 74

## 2018-05-16 VITALS — BP 145/101 | HR 97 | Temp 98.3°F | Wt 142.0 lb

## 2018-05-16 DIAGNOSIS — Z171 Estrogen receptor negative status [ER-]: Secondary | ICD-10-CM | POA: Diagnosis not present

## 2018-05-16 DIAGNOSIS — C50312 Malignant neoplasm of lower-inner quadrant of left female breast: Secondary | ICD-10-CM

## 2018-05-16 DIAGNOSIS — F419 Anxiety disorder, unspecified: Secondary | ICD-10-CM | POA: Diagnosis not present

## 2018-05-16 DIAGNOSIS — D696 Thrombocytopenia, unspecified: Secondary | ICD-10-CM

## 2018-05-16 DIAGNOSIS — R748 Abnormal levels of other serum enzymes: Secondary | ICD-10-CM | POA: Diagnosis not present

## 2018-05-16 DIAGNOSIS — Z95828 Presence of other vascular implants and grafts: Secondary | ICD-10-CM

## 2018-05-16 DIAGNOSIS — Z5111 Encounter for antineoplastic chemotherapy: Secondary | ICD-10-CM | POA: Diagnosis not present

## 2018-05-16 LAB — COMPREHENSIVE METABOLIC PANEL
ALT: 70 U/L — ABNORMAL HIGH (ref 0–44)
AST: 54 U/L — ABNORMAL HIGH (ref 15–41)
Albumin: 4.5 g/dL (ref 3.5–5.0)
Alkaline Phosphatase: 90 U/L (ref 38–126)
Anion gap: 10 (ref 5–15)
BUN: 14 mg/dL (ref 6–20)
CO2: 24 mmol/L (ref 22–32)
Calcium: 9.6 mg/dL (ref 8.9–10.3)
Chloride: 102 mmol/L (ref 98–111)
Creatinine, Ser: 0.67 mg/dL (ref 0.44–1.00)
GFR calc Af Amer: >60 mL/min (ref >60)
GFR calc non Af Amer: >60 mL/min (ref >60)
Glucose, Bld: 109 mg/dL — ABNORMAL HIGH (ref 70–99)
Potassium: 3.8 mmol/L (ref 3.5–5.1)
Sodium: 136 mmol/L (ref 135–145)
Total Bilirubin: 0.4 mg/dL (ref 0.3–1.2)
Total Protein: 7.7 g/dL (ref 6.5–8.1)

## 2018-05-16 LAB — CBC WITH DIFFERENTIAL/PLATELET
Abs Immature Granulocytes: 0.01 10*3/uL (ref 0.00–0.07)
Basophils Absolute: 0 10*3/uL (ref 0.0–0.1)
Basophils Relative: 0 %
Eosinophils Absolute: 0 10*3/uL (ref 0.0–0.5)
Eosinophils Relative: 1 %
HCT: 33.8 % — ABNORMAL LOW (ref 36.0–46.0)
Hemoglobin: 11.7 g/dL — ABNORMAL LOW (ref 12.0–15.0)
Immature Granulocytes: 0 %
Lymphocytes Relative: 23 %
Lymphs Abs: 1.2 10*3/uL (ref 0.7–4.0)
MCH: 34.7 pg — ABNORMAL HIGH (ref 26.0–34.0)
MCHC: 34.6 g/dL (ref 30.0–36.0)
MCV: 100.3 fL — ABNORMAL HIGH (ref 80.0–100.0)
Monocytes Absolute: 0.4 10*3/uL (ref 0.1–1.0)
Monocytes Relative: 9 %
Neutro Abs: 3.4 10*3/uL (ref 1.7–7.7)
Neutrophils Relative %: 67 %
Platelets: 126 10*3/uL — ABNORMAL LOW (ref 150–400)
RBC: 3.37 MIL/uL — ABNORMAL LOW (ref 3.87–5.11)
RDW: 11.9 % (ref 11.5–15.5)
WBC: 5.1 10*3/uL (ref 4.0–10.5)
nRBC: 0 % (ref 0.0–0.2)

## 2018-05-16 LAB — SAMPLE TO BLOOD BANK

## 2018-05-16 MED ORDER — DEXAMETHASONE SODIUM PHOSPHATE 10 MG/ML IJ SOLN
10.0000 mg | Freq: Once | INTRAMUSCULAR | Status: AC
  Administered 2018-05-16: 11:00:00 10 mg via INTRAVENOUS
  Filled 2018-05-16: qty 1

## 2018-05-16 MED ORDER — FAMOTIDINE IN NACL 20-0.9 MG/50ML-% IV SOLN
20.0000 mg | Freq: Once | INTRAVENOUS | Status: AC
  Administered 2018-05-16: 11:00:00 20 mg via INTRAVENOUS
  Filled 2018-05-16: qty 50

## 2018-05-16 MED ORDER — DIPHENHYDRAMINE HCL 50 MG/ML IJ SOLN
25.0000 mg | Freq: Once | INTRAMUSCULAR | Status: AC
  Administered 2018-05-16: 11:00:00 25 mg via INTRAVENOUS
  Filled 2018-05-16: qty 1

## 2018-05-16 MED ORDER — SODIUM CHLORIDE 0.9% FLUSH
10.0000 mL | Freq: Once | INTRAVENOUS | Status: AC
  Administered 2018-05-16: 10 mL via INTRAVENOUS
  Filled 2018-05-16: qty 10

## 2018-05-16 MED ORDER — SODIUM CHLORIDE 0.9 % IV SOLN
Freq: Once | INTRAVENOUS | Status: AC
  Administered 2018-05-16: 11:00:00 via INTRAVENOUS
  Filled 2018-05-16: qty 250

## 2018-05-16 MED ORDER — SODIUM CHLORIDE 0.9 % IV SOLN
80.0000 mg/m2 | Freq: Once | INTRAVENOUS | Status: AC
  Administered 2018-05-16: 138 mg via INTRAVENOUS
  Filled 2018-05-16: qty 23

## 2018-05-16 MED ORDER — HEPARIN SOD (PORK) LOCK FLUSH 100 UNIT/ML IV SOLN
500.0000 [IU] | Freq: Once | INTRAVENOUS | Status: AC | PRN
  Administered 2018-05-16: 13:00:00 500 [IU]
  Filled 2018-05-16: qty 5

## 2018-05-16 NOTE — Progress Notes (Signed)
Shelby [name]  Telephone:(336) [phone] [phone]) [phone]  ID: Carolan Clines OB: Dec 31, 1970  MR#: [id number]  UOH#:[id number]  Patient Care Team: Jerrol Banana., MD as PCP - General (Family Medicine) [name], MD as Consulting Physician (Cardiology)  CHIEF COMPLAINT: Clinical stage IIIB triple negative invasive carcinoma of the lower inner quadrant of the left breast.  INTERVAL HISTORY: Patient returns to clinic today for further evaluation and consideration of cycle 7, day 8 of carboplatin and Taxol.  Taxol only.  She is tolerating her treatments relatively well.  She currently feels well and is asymptomatic. She continues to be highly anxious. She does not complain of weakness or fatigue today. She denies any recent fevers or illnesses.  She has no neurologic complaints.  She has a good appetite and denies weight loss.  She denies any chest pain, shortness of breath, cough, or hemoptysis.  She denies any nausea, vomiting, constipation, or diarrhea.  She has no urinary complaints.  Patient offers no specific complaints today.  REVIEW OF SYSTEMS:   Review of Systems  Constitutional: Negative.  Negative for fever, malaise/fatigue and weight loss.  HENT: Negative.  Negative for congestion, ear pain and sore throat.   Respiratory: Negative.  Negative for cough, hemoptysis and shortness of breath.   Cardiovascular: Negative.  Negative for chest pain and leg swelling.  Gastrointestinal: Negative.  Negative for abdominal pain and melena.  Genitourinary: Negative.  Negative for dysuria.  Musculoskeletal: Negative.  Negative for back pain.  Skin: Negative.  Negative for rash.  Neurological: Negative.  Negative for dizziness, focal weakness, weakness and headaches.  Psychiatric/Behavioral: The patient is nervous/anxious.     As per HPI. Otherwise, a complete review of systems is negative.  PAST MEDICAL HISTORY: Past Medical History:  Diagnosis Date    Anemia    h/o with pregnancy   Anxiety    Asthma    allergy induced-no inhaler   Cancer ([place])    Complication of anesthesia    Environmental allergies    Family history of adverse reaction to anesthesia    son-breathing problems-coded 1st time when he was 2 and had another surgery at 61 and had to be admitted for breathing problems   Family history of breast cancer    GERD (gastroesophageal reflux disease)    Headache    migraines   Hypertension    Mitral valve disease    Mitral valve prolapse    Painful menstrual periods    PONV (postoperative nausea and vomiting)    Vertigo     PAST SURGICAL HISTORY: Past Surgical History:  Procedure Laterality Date   ABDOMINAL HYSTERECTOMY  2010   supracervical    BUNIONECTOMY Bilateral    MANDIBLE RECONSTRUCTION     age 66-underbite   PORTACATH PLACEMENT Right 12/31/2017   Procedure: INSERTION PORT-A-CATH;  Surgeon: Robert Bellow, MD;  Location: [facility] ORS;  Service: General;  Laterality: Right;   SHOULDER ARTHROSCOPY WITH OPEN ROTATOR CUFF REPAIR Right 04/26/2017   Procedure: SHOULDER ARTHROSCOPY WITH OPEN ROTATOR CUFF REPAIR,SUBACROMINAL DECOMPRESSION;  Surgeon: Thornton Park, MD;  Location: [facility] ORS;  Service: Orthopedics;  Laterality: Right;   TONSILLECTOMY      FAMILY HISTORY: Family History  Problem Relation Age of Onset   Breast cancer Mother 66   Diabetes Father    Cancer Maternal Uncle        colon   Breast cancer Maternal Grandmother 25    ADVANCED DIRECTIVES (Y/N):  N  HEALTH MAINTENANCE: Social  History   Tobacco Use   Smoking status: Former Smoker    Packs/day: 0.50    Years: 10.00    Pack years: 5.00    Types: Cigarettes    Last attempt to quit: 04/20/2007    Years since quitting: 11.0   Smokeless tobacco: Never Used   Tobacco comment: social smoker back then  Substance Use Topics   Alcohol use: Yes    Comment: occas   Drug use: No     Colonoscopy:  PAP:  Bone  density:  Lipid panel:  Allergies  Allergen Reactions   Hydrocodone-Acetaminophen Hives    swollen face   Iodine Hives   Peanut Butter Flavor Other (See Comments)    Made mouth tingle   Shellfish Allergy Hives    Current Outpatient Medications  Medication Sig Dispense Refill   ALPRAZolam (XANAX) 0.25 MG tablet Take 1 tablet (0.25 mg total) by mouth 2 (two) times daily as needed for anxiety. 60 tablet 0   Ascorbic Acid (VITAMIN C PO) Take 1 tablet by mouth daily.      Cholecalciferol (VITAMIN D) 50 MCG (2000 UT) CAPS Take 2,000 Units by mouth daily.     dexamethasone (DECADRON) 4 MG tablet Take 1 tablet (4 mg total) by mouth daily. 5 tablet 0   EPINEPHrine 0.3 mg/0.3 mL IJ SOAJ injection Inject 0.3 mLs (0.3 mg total) into the muscle once. 1 Device 12   famotidine (PEPCID) 20 MG tablet   0   fluticasone (FLONASE) 50 MCG/ACT nasal spray Place 1 spray into both nostrils daily as needed for allergies or rhinitis.     ibuprofen (ADVIL,MOTRIN) 200 MG tablet Take 400 mg by mouth daily as needed for moderate pain.     L-Methylfolate 15 MG TABS TAKE 1/2 TABLET (7.5 MG) BY MOUTH DAILY 45 tablet 3   lidocaine (XYLOCAINE) 2 % jelly Apply 1 application topically as needed. For pain 30 mL 0   lidocaine-prilocaine (EMLA) cream Apply to affected area once 30 g 3   losartan (COZAAR) 50 MG tablet TAKE 1 TABLET BY MOUTH DAILY 90 tablet 3   magnesium hydroxide (MILK OF MAGNESIA) 400 MG/5ML suspension Take 5 mLs by mouth daily as needed for mild constipation.     mometasone (NASONEX) 50 MCG/ACT nasal spray Place 2 sprays into the nose daily. 17 g 12   montelukast (SINGULAIR) 10 MG tablet TAKE 1 TABLET BY MOUTH AT BEDTIME 30 tablet 12   ondansetron (ZOFRAN) 8 MG tablet Take 1 tablet (8 mg total) by mouth 2 (two) times daily as needed. 30 tablet 2   Probiotic CAPS Take 1 capsule by mouth at bedtime.     prochlorperazine (COMPAZINE) 10 MG tablet Take 1 tablet (10 mg total) by mouth every  6 (six) hours as needed (Nausea or vomiting). 60 tablet 2   tretinoin (RETIN-A) 0.1 % cream Apply 1 application topically at bedtime.     No current facility-administered medications for this visit.     OBJECTIVE: Vitals:   05/16/18 0942  BP: (!) 145/101  Pulse: 97  Temp: 98.3 F (36.8 C)     Body mass index is 22.92 kg/m.    ECOG FS:0 - Asymptomatic  General: Well-developed, well-nourished, no acute distress. Eyes: Pink conjunctiva, anicteric sclera. HEENT: Normocephalic, moist mucous membranes. Breast: Exam deferred today. Lungs: Clear to auscultation bilaterally. Heart: Regular rate and rhythm. No rubs, murmurs, or gallops. Abdomen: Soft, nontender, nondistended. No organomegaly noted, normoactive bowel sounds. Musculoskeletal: No edema, cyanosis, or clubbing. Neuro:  Alert, answering all questions appropriately. Cranial nerves grossly intact. Skin: No rashes or petechiae noted. Psych: Normal affect.  LAB RESULTS:  Lab Results  Component Value Date   NA 136 05/16/2018   K 3.8 05/16/2018   CL 102 05/16/2018   CO2 24 05/16/2018   GLUCOSE 109 (H) 05/16/2018   BUN 14 05/16/2018   CREATININE 0.67 05/16/2018   CALCIUM 9.6 05/16/2018   PROT 7.7 05/16/2018   ALBUMIN 4.5 05/16/2018   AST 54 (H) 05/16/2018   ALT 70 (H) 05/16/2018   ALKPHOS 90 05/16/2018   BILITOT 0.4 05/16/2018   GFRNONAA >60 05/16/2018   GFRAA >60 05/16/2018    Lab Results  Component Value Date   WBC 5.1 05/16/2018   NEUTROABS 3.4 05/16/2018   HGB 11.7 (L) 05/16/2018   HCT 33.8 (L) 05/16/2018   MCV 100.3 (H) 05/16/2018   PLT 126 (L) 05/16/2018     STUDIES: No results found.  ASSESSMENT: Clinical stage IIIB triple negative invasive carcinoma of the lower inner quadrant of the left breast.  PLAN:    1. Clinical stage IIIB triple negative invasive carcinoma of the lower inner quadrant of the left breast: CT scan results from January 01, 2018 did not reveal any metastatic disease.  Given  patient's stage of disease, she will benefit from neoadjuvant chemotherapy using Adriamycin, Cytoxan with Udenyca support followed by carboplatinum and Taxol.  She completed cycle 4 of Adriamycin and Cytoxan on February 14, 2018.  Patient has indicated she may prefer to undergo mastectomy, therefore adjuvant XRT may not be needed.  An aromatase inhibitor would not offer any benefit given the triple negative status of her disease.  Also discussed today with the possibility of using capecitabine adjuvantly if patient does not have a complete pathologic response.  MUGA scan on January 01, 2018 revealed an EF of 71%.  Proceed with cycle 7, day 8 of carboplatinum and Taxol today.  Taxol only.  Return to clinic in 1 week for further evaluation and consideration of cycle 7, day 15.   2.  Anxiety: Chronic.  Continue Xanax as needed. 3.  Anemia: Hemoglobin has improved to 11.7.  Monitor.   4.  Headache: Patient does not complain of this today.  Continue ibuprofen sparingly as needed. 5.  Neutropenia: Resolved. 6.  Weakness and fatigue: Patient does not complain of this today, but will continue with IV fluids on the Friday after her Thursday infusions. 7.  Liver enzymes: Chronic and unchanged.  AST and ALT remain mildly elevated.  Monitor. 8.  Thrombocytopenia: Mild.  Patient's platelet count is 126 today.  Patient expressed understanding and was in agreement with this plan. She also understands that She can call clinic at any time with any questions, concerns, or complaints.   Cancer Staging Primary cancer of lower-inner quadrant of left female breast [facility]) Staging form: Breast, AJCC 8th Edition - Clinical stage from 12/24/2017: Stage IIIB (cT2, cN1, cM0, G3, ER-, PR-, HER2-) - Signed by Lloyd Huger, MD on 12/24/2017 - Pathologic: No stage assigned - Unsigned   Lloyd Huger, MD   05/17/2018 6:34 AM

## 2018-05-16 NOTE — Progress Notes (Signed)
Patient stated that after her last chemotherapy she had pain on her bilateral hands. After a few days later it resolved.  Patient stated that she never received our response to her MyChart.

## 2018-05-23 ENCOUNTER — Inpatient Hospital Stay: Payer: No Typology Code available for payment source

## 2018-05-23 ENCOUNTER — Other Ambulatory Visit: Payer: Self-pay

## 2018-05-23 ENCOUNTER — Other Ambulatory Visit: Payer: Self-pay | Admitting: Oncology

## 2018-05-23 ENCOUNTER — Inpatient Hospital Stay (HOSPITAL_BASED_OUTPATIENT_CLINIC_OR_DEPARTMENT_OTHER): Payer: No Typology Code available for payment source | Admitting: Oncology

## 2018-05-23 ENCOUNTER — Encounter: Payer: Self-pay | Admitting: Oncology

## 2018-05-23 ENCOUNTER — Telehealth: Payer: Self-pay | Admitting: *Deleted

## 2018-05-23 VITALS — BP 138/87 | HR 99 | Temp 98.7°F | Ht 67.0 in | Wt 144.0 lb

## 2018-05-23 DIAGNOSIS — C50312 Malignant neoplasm of lower-inner quadrant of left female breast: Secondary | ICD-10-CM

## 2018-05-23 DIAGNOSIS — D701 Agranulocytosis secondary to cancer chemotherapy: Secondary | ICD-10-CM

## 2018-05-23 DIAGNOSIS — Z5111 Encounter for antineoplastic chemotherapy: Secondary | ICD-10-CM | POA: Diagnosis not present

## 2018-05-23 DIAGNOSIS — Z95828 Presence of other vascular implants and grafts: Secondary | ICD-10-CM

## 2018-05-23 DIAGNOSIS — Z87891 Personal history of nicotine dependence: Secondary | ICD-10-CM

## 2018-05-23 DIAGNOSIS — G620 Drug-induced polyneuropathy: Secondary | ICD-10-CM

## 2018-05-23 DIAGNOSIS — T451X5A Adverse effect of antineoplastic and immunosuppressive drugs, initial encounter: Secondary | ICD-10-CM

## 2018-05-23 DIAGNOSIS — D6481 Anemia due to antineoplastic chemotherapy: Secondary | ICD-10-CM

## 2018-05-23 LAB — SAMPLE TO BLOOD BANK

## 2018-05-23 LAB — CBC WITH DIFFERENTIAL/PLATELET
Abs Immature Granulocytes: 0.01 10*3/uL (ref 0.00–0.07)
Basophils Absolute: 0 10*3/uL (ref 0.0–0.1)
Basophils Relative: 1 %
Eosinophils Absolute: 0 10*3/uL (ref 0.0–0.5)
Eosinophils Relative: 1 %
HCT: 29.7 % — ABNORMAL LOW (ref 36.0–46.0)
Hemoglobin: 10.5 g/dL — ABNORMAL LOW (ref 12.0–15.0)
Immature Granulocytes: 0 %
Lymphocytes Relative: 36 %
Lymphs Abs: 1 10*3/uL (ref 0.7–4.0)
MCH: 34.8 pg — ABNORMAL HIGH (ref 26.0–34.0)
MCHC: 35.4 g/dL (ref 30.0–36.0)
MCV: 98.3 fL (ref 80.0–100.0)
Monocytes Absolute: 0.2 10*3/uL (ref 0.1–1.0)
Monocytes Relative: 7 %
Neutro Abs: 1.6 10*3/uL — ABNORMAL LOW (ref 1.7–7.7)
Neutrophils Relative %: 55 %
Platelets: 121 10*3/uL — ABNORMAL LOW (ref 150–400)
RBC: 3.02 MIL/uL — ABNORMAL LOW (ref 3.87–5.11)
RDW: 11.7 % (ref 11.5–15.5)
WBC: 2.9 10*3/uL — ABNORMAL LOW (ref 4.0–10.5)
nRBC: 0 % (ref 0.0–0.2)

## 2018-05-23 LAB — COMPREHENSIVE METABOLIC PANEL
ALT: 83 U/L — ABNORMAL HIGH (ref 0–44)
AST: 57 U/L — ABNORMAL HIGH (ref 15–41)
Albumin: 4.3 g/dL (ref 3.5–5.0)
Alkaline Phosphatase: 72 U/L (ref 38–126)
Anion gap: 8 (ref 5–15)
BUN: 17 mg/dL (ref 6–20)
CO2: 25 mmol/L (ref 22–32)
Calcium: 9.4 mg/dL (ref 8.9–10.3)
Chloride: 103 mmol/L (ref 98–111)
Creatinine, Ser: 0.52 mg/dL (ref 0.44–1.00)
GFR calc Af Amer: >60 mL/min (ref >60)
GFR calc non Af Amer: >60 mL/min (ref >60)
Glucose, Bld: 111 mg/dL — ABNORMAL HIGH (ref 70–99)
Potassium: 3.9 mmol/L (ref 3.5–5.1)
Sodium: 136 mmol/L (ref 135–145)
Total Bilirubin: 0.4 mg/dL (ref 0.3–1.2)
Total Protein: 7 g/dL (ref 6.5–8.1)

## 2018-05-23 MED ORDER — SODIUM CHLORIDE 0.9% FLUSH
10.0000 mL | Freq: Once | INTRAVENOUS | Status: AC
  Administered 2018-05-23: 10 mL via INTRAVENOUS
  Filled 2018-05-23: qty 10

## 2018-05-23 MED ORDER — DIPHENHYDRAMINE HCL 50 MG/ML IJ SOLN
25.0000 mg | Freq: Once | INTRAMUSCULAR | Status: AC
  Administered 2018-05-23: 09:00:00 25 mg via INTRAVENOUS
  Filled 2018-05-23: qty 1

## 2018-05-23 MED ORDER — HEPARIN SOD (PORK) LOCK FLUSH 100 UNIT/ML IV SOLN
500.0000 [IU] | Freq: Once | INTRAVENOUS | Status: AC | PRN
  Administered 2018-05-23: 12:00:00 500 [IU]
  Filled 2018-05-23: qty 5

## 2018-05-23 MED ORDER — FAMOTIDINE IN NACL 20-0.9 MG/50ML-% IV SOLN
20.0000 mg | Freq: Once | INTRAVENOUS | Status: AC
  Administered 2018-05-23: 10:00:00 20 mg via INTRAVENOUS
  Filled 2018-05-23: qty 50

## 2018-05-23 MED ORDER — DEXAMETHASONE SODIUM PHOSPHATE 10 MG/ML IJ SOLN
10.0000 mg | Freq: Once | INTRAMUSCULAR | Status: AC
  Administered 2018-05-23: 09:00:00 10 mg via INTRAVENOUS
  Filled 2018-05-23: qty 1

## 2018-05-23 MED ORDER — FAMOTIDINE IN NACL 20-0.9 MG/50ML-% IV SOLN: 20.0000 mg | Freq: Once | INTRAVENOUS | Status: DC

## 2018-05-23 MED ORDER — SODIUM CHLORIDE 0.9 % IV SOLN
20.0000 mg | Freq: Once | INTRAVENOUS | Status: DC
  Filled 2018-05-23: qty 2

## 2018-05-23 MED ORDER — SODIUM CHLORIDE 0.9 % IV SOLN
80.0000 mg/m2 | Freq: Once | INTRAVENOUS | Status: AC
  Administered 2018-05-23: 10:00:00 138 mg via INTRAVENOUS
  Filled 2018-05-23: qty 23

## 2018-05-23 MED ORDER — SODIUM CHLORIDE 0.9 % IV SOLN
Freq: Once | INTRAVENOUS | Status: AC
  Administered 2018-05-23: 09:00:00 via INTRAVENOUS
  Filled 2018-05-23: qty 250

## 2018-05-23 NOTE — Progress Notes (Signed)
Patient stated that she had been doing well. 

## 2018-05-23 NOTE — Telephone Encounter (Signed)
Patient called reporting that she has difficulty sleeping after getting the steroids with her chemotherapy treatment She has Xanax 0,25 mg she takes at night, but is asking if OK to take 2 for a few nights after her chemotherapy treatments. Please advise

## 2018-05-24 NOTE — Telephone Encounter (Signed)
Patient informed of physician response 

## 2018-05-24 NOTE — Telephone Encounter (Signed)
Yes, that it fine.

## 2018-05-26 NOTE — Progress Notes (Signed)
Hematology/Oncology Consult note [facility]  Telephone:([phone] [phone]  Patient Care Team: Jerrol Banana., MD as PCP - General (Family Medicine) Minna Merritts, MD as Consulting Physician (Cardiology)   Name of the [name]  [id number]  [birth year]   Date of visit: 05/26/18  Diagnosis-stage IIIb invasive carcinoma of the left breast triple negative  Chief complaint/ Reason for visit-on treatment assessment prior to cycle 9 of weekly Taxol chemotherapy  Heme/Onc history: Patient is a 48 year old female with a history of clinical stage IIIb invasive mammary carcinoma of the left breast currently undergoing neoadjuvant chemotherapy.  She has completed 4 cycles of dose dense AC chemotherapy and currently undergoing carboplatin every [redacted] weeks along with weekly Taxol.  She continues Neulasta with every cycle of carboplatin and possible Neupogen with Taxol.  She is tolerated chemotherapy well and has mild neuropathy.  She sees Dr. Grayland Ormond as an outpatient  Interval history-overall she is tolerating chemotherapy well.  She reports mild tingling numbness in her hands and feet which is intermittent and has not affected her day-to-day quality of life. Denies any nausea vomiting fever or other complaints   ECOG PS- 0 Pain scale- 0 Opioid associated constipation- no  Review of systems- Review of Systems  Constitutional: Positive for malaise/fatigue. Negative for chills, fever and weight loss.  HENT: Negative for congestion, ear discharge and nosebleeds.   Eyes: Negative for blurred vision.  Respiratory: Negative for cough, hemoptysis, sputum production, shortness of breath and wheezing.   Cardiovascular: Negative for chest pain, palpitations, orthopnea and claudication.  Gastrointestinal: Negative for abdominal pain, blood in stool, constipation, diarrhea, heartburn, melena, nausea and vomiting.  Genitourinary: Negative for  dysuria, flank pain, frequency, hematuria and urgency.  Musculoskeletal: Negative for back pain, joint pain and myalgias.  Skin: Negative for rash.  Neurological: Negative for dizziness, tingling, focal weakness, seizures, weakness and headaches.  Endo/Heme/Allergies: Does not bruise/bleed easily.  Psychiatric/Behavioral: Negative for depression and suicidal ideas. The patient does not have insomnia.       Allergies  Allergen Reactions  . Hydrocodone-Acetaminophen Hives    swollen face  . Iodine Hives  . Peanut Butter Flavor Other (See Comments)    Made mouth tingle  . Shellfish Allergy Hives     Past Medical History:  Diagnosis Date  . Anemia    h/o with pregnancy  . Anxiety   . Asthma    allergy induced-no inhaler  . Cancer ([place])   . Complication of anesthesia   . Environmental allergies   . Family history of adverse reaction to anesthesia    son-breathing problems-coded 1st time when he was 2 and had another surgery at 47 and had to be admitted for breathing problems  . Family history of breast cancer   . GERD (gastroesophageal reflux disease)   . Headache    migraines  . Hypertension   . Mitral valve disease   . Mitral valve prolapse   . Painful menstrual periods   . PONV (postoperative nausea and vomiting)   . Vertigo      Past Surgical History:  Procedure Laterality Date  . ABDOMINAL HYSTERECTOMY  2010   supracervical   . BUNIONECTOMY Bilateral   . MANDIBLE RECONSTRUCTION     age 66-underbite  . PORTACATH PLACEMENT Right 12/31/2017   Procedure: INSERTION PORT-A-CATH;  Surgeon: Robert Bellow, MD;  Location: [facility] ORS;  Service: General;  Laterality: Right;  . SHOULDER ARTHROSCOPY WITH OPEN ROTATOR CUFF REPAIR  Right 04/26/2017   Procedure: SHOULDER ARTHROSCOPY WITH OPEN ROTATOR CUFF REPAIR,SUBACROMINAL DECOMPRESSION;  Surgeon: Thornton Park, MD;  Location: [facility] ORS;  Service: Orthopedics;  Laterality: Right;  . TONSILLECTOMY      Social History    Socioeconomic History  . Marital status: Divorced    Spouse name: Not on file  . Number of children: Not on file  . Years of education: Not on file  . Highest education level: Not on file  Occupational History  . Not on file  Social Needs  . Financial resource strain: Not on file  . Food insecurity:    Worry: Not on file    Inability: Not on file  . Transportation needs:    Medical: Not on file    Non-medical: Not on file  Tobacco Use  . Smoking status: Former Smoker    Packs/day: 0.50    Years: 10.00    Pack years: 5.00    Types: Cigarettes    Last attempt to quit: 04/20/2007    Years since quitting: 11.1  . Smokeless tobacco: Never Used  . Tobacco comment: social smoker back then  Substance and Sexual Activity  . Alcohol use: Yes    Comment: occas  . Drug use: No  . Sexual activity: Yes  Lifestyle  . Physical activity:    Days per week: Not on file    Minutes per session: Not on file  . Stress: Not on file  Relationships  . Social connections:    Talks on phone: Not on file    Gets together: Not on file    Attends religious service: Not on file    Active member of club or organization: Not on file    Attends meetings of clubs or organizations: Not on file    Relationship status: Not on file  . Intimate partner violence:    Fear of current or ex partner: Not on file    Emotionally abused: Not on file    Physically abused: Not on file    Forced sexual activity: Not on file  Other Topics Concern  . Not on file  Social History Narrative  . Not on file    Family History  Problem Relation Age of Onset  . Breast cancer Mother 47  . Diabetes Father   . Cancer Maternal Uncle        colon  . Breast cancer Maternal Grandmother 4     Current Outpatient Medications:  .  ALPRAZolam (XANAX) 0.25 MG tablet, Take 1 tablet (0.25 mg total) by mouth 2 (two) times daily as needed for anxiety., Disp: 60 tablet, Rfl: 0 .  Ascorbic Acid (VITAMIN C PO), Take 1 tablet by  mouth daily. , Disp: , Rfl:  .  Cholecalciferol (VITAMIN D) 50 MCG (2000 UT) CAPS, Take 2,000 Units by mouth daily., Disp: , Rfl:  .  dexamethasone (DECADRON) 4 MG tablet, Take 1 tablet (4 mg total) by mouth daily., Disp: 5 tablet, Rfl: 0 .  EPINEPHrine 0.3 mg/0.3 mL IJ SOAJ injection, Inject 0.3 mLs (0.3 mg total) into the muscle once., Disp: 1 Device, Rfl: 12 .  famotidine (PEPCID) 20 MG tablet, , Disp: , Rfl: 0 .  fluticasone (FLONASE) 50 MCG/ACT nasal spray, Place 1 spray into both nostrils daily as needed for allergies or rhinitis., Disp: , Rfl:  .  ibuprofen (ADVIL,MOTRIN) 200 MG tablet, Take 400 mg by mouth daily as needed for moderate pain., Disp: , Rfl:  .  L-Methylfolate 15 MG TABS,  TAKE 1/2 TABLET (7.5 MG) BY MOUTH DAILY, Disp: 45 tablet, Rfl: 3 .  lidocaine (XYLOCAINE) 2 % jelly, Apply 1 application topically as needed. For pain, Disp: 30 mL, Rfl: 0 .  lidocaine-prilocaine (EMLA) cream, Apply to affected area once, Disp: 30 g, Rfl: 3 .  losartan (COZAAR) 50 MG tablet, TAKE 1 TABLET BY MOUTH DAILY, Disp: 90 tablet, Rfl: 3 .  magnesium hydroxide (MILK OF MAGNESIA) 400 MG/5ML suspension, Take 5 mLs by mouth daily as needed for mild constipation., Disp: , Rfl:  .  mometasone (NASONEX) 50 MCG/ACT nasal spray, Place 2 sprays into the nose daily., Disp: 17 g, Rfl: 12 .  montelukast (SINGULAIR) 10 MG tablet, TAKE 1 TABLET BY MOUTH AT BEDTIME, Disp: 30 tablet, Rfl: 12 .  ondansetron (ZOFRAN) 8 MG tablet, Take 1 tablet (8 mg total) by mouth 2 (two) times daily as needed., Disp: 30 tablet, Rfl: 2 .  Probiotic CAPS, Take 1 capsule by mouth at bedtime., Disp: , Rfl:  .  prochlorperazine (COMPAZINE) 10 MG tablet, Take 1 tablet (10 mg total) by mouth every 6 (six) hours as needed (Nausea or vomiting)., Disp: 60 tablet, Rfl: 2 .  tretinoin (RETIN-A) 0.1 % cream, Apply 1 application topically at bedtime., Disp: , Rfl:   Physical exam:  Vitals:   05/23/18 0836  BP: 138/87  Pulse: 99  Temp: 98.7 F  (37.1 C)  TempSrc: Tympanic  Weight: 144 lb (65.3 kg)  Height: 5\' 7"  (1.702 m)   Physical Exam Constitutional:      General: She is not in acute distress. HENT:     Head: Normocephalic and atraumatic.  Eyes:     Pupils: Pupils are equal, round, and reactive to light.  Neck:     Musculoskeletal: Normal range of motion.  Cardiovascular:     Rate and Rhythm: Normal rate and regular rhythm.     Heart sounds: Normal heart sounds.  Pulmonary:     Effort: Pulmonary effort is normal.     Breath sounds: Normal breath sounds.  Abdominal:     General: Bowel sounds are normal.     Palpations: Abdomen is soft.  Skin:    General: Skin is warm and dry.  Neurological:     Mental Status: She is alert and oriented to person, place, and time.      CMP Latest Ref Rng & Units 05/23/2018  Glucose 70 - 99 mg/dL 111(H)  BUN 6 - 20 mg/dL 17  Creatinine 0.44 - 1.00 mg/dL 0.52  Sodium 135 - 145 mmol/L 136  Potassium 3.5 - 5.1 mmol/L 3.9  Chloride 98 - 111 mmol/L 103  CO2 22 - 32 mmol/L 25  Calcium 8.9 - 10.3 mg/dL 9.4  Total Protein 6.5 - 8.1 g/dL 7.0  Total Bilirubin 0.3 - 1.2 mg/dL 0.4  Alkaline Phos 38 - 126 U/L 72  AST 15 - 41 U/L 57(H)  ALT 0 - 44 U/L 83(H)   CBC Latest Ref Rng & Units 05/23/2018  WBC 4.0 - 10.5 K/uL 2.9(L)  Hemoglobin 12.0 - 15.0 g/dL 10.5(L)  Hematocrit 36.0 - 46.0 % 29.7(L)  Platelets 150 - 400 K/uL 121(L)    Assessment and plan- Patient is a 48 y.o. female with history of stage IIIb triple negative invasive mammary carcinoma of the left breast currently undergoing neoadjuvant chemotherapy.  She is here for on treatment assessment prior to cycle 9 of weekly Taxol chemotherapy  Counts okay to proceed with cycle 9 of weekly Taxol chemotherapy today.  Her ANC is 1.6 and therefore she will not be receiving any Neupogen tomorrow.    She will see Dr. Grayland Ormond in 1 week's time with CBC with differential and BMP for cycle 10 of Taxol and last cycle of carboplatin.  She  will receive Neulasta at that time.  Chemo-induced neutropenia: ANC 1.6 today.  Continue to monitor  Chemo-induced peripheral neuropathy: Mild grade 1 continue to monitor  Thrombocytopenia and anemia stable continue to monitor secondary to chemotherapy   Visit Diagnosis 1. Encounter for antineoplastic chemotherapy   2. Primary cancer of lower-inner quadrant of left female breast ([place])   3. Chemotherapy induced neutropenia ([facility])   4. Antineoplastic chemotherapy induced anemia   5. Chemotherapy-induced peripheral neuropathy ([place])      Dr. Randa Evens, MD, MPH [facility] [phone] 05/26/2018 12:09 PM

## 2018-05-28 NOTE — Progress Notes (Signed)
[facility]  [phone] [phone]  ID: Shelby [name] OB: 1970-05-12  MR#: [id number]  OQH#:[id number]  Patient Care Team: Jerrol Banana., MD as PCP - General (Family Medicine) [name], MD as Consulting Physician (Cardiology)  CHIEF COMPLAINT: Clinical stage IIIB triple negative invasive carcinoma of the lower inner quadrant of the left breast.  INTERVAL HISTORY: Patient returns to clinic for further evaluation and consideration of cycle 8, day 1 of carboplatin and taxol.  She continues to tolerate her treatments relatively well. She currently feels well and is asymptomatic. She continues to be highly anxious. She does not complain of weakness or fatigue today. She denies any recent fevers or illnesses.  She has no neurologic complaints.  She has a good appetite and denies weight loss.  She denies any chest pain, shortness of breath, cough, or hemoptysis.  She denies any nausea, vomiting, constipation, or diarrhea.  She has no urinary complaints. Patient offers no specific complaints today.  REVIEW OF SYSTEMS:   Review of Systems  Constitutional: Negative.  Negative for fever, malaise/fatigue and weight loss.  HENT: Negative.  Negative for congestion, ear pain and sore throat.   Respiratory: Negative.  Negative for cough, hemoptysis and shortness of breath.   Cardiovascular: Negative.  Negative for chest pain and leg swelling.  Gastrointestinal: Negative.  Negative for abdominal pain and melena.  Genitourinary: Negative.  Negative for dysuria.  Musculoskeletal: Negative.  Negative for back pain.  Skin: Negative.  Negative for rash.  Neurological: Negative.  Negative for dizziness, focal weakness, weakness and headaches.  Psychiatric/Behavioral: The patient is nervous/anxious.     As per HPI. Otherwise, a complete review of systems is negative.  PAST MEDICAL HISTORY: Past Medical History:  Diagnosis Date   Anemia    h/o  with pregnancy   Anxiety    Asthma    allergy induced-no inhaler   Cancer ([place])    Complication of anesthesia    Environmental allergies    Family history of adverse reaction to anesthesia    son-breathing problems-coded 1st time when he was 2 and had another surgery at 28 and had to be admitted for breathing problems   Family history of breast cancer    GERD (gastroesophageal reflux disease)    Headache    migraines   Hypertension    Mitral valve disease    Mitral valve prolapse    Painful menstrual periods    PONV (postoperative nausea and vomiting)    Vertigo     PAST SURGICAL HISTORY: Past Surgical History:  Procedure Laterality Date   ABDOMINAL HYSTERECTOMY  2010   supracervical    BUNIONECTOMY Bilateral    MANDIBLE RECONSTRUCTION     age 36-underbite   PORTACATH PLACEMENT Right 12/31/2017   Procedure: INSERTION PORT-A-CATH;  Surgeon: Robert Bellow, MD;  Location: [facility] ORS;  Service: General;  Laterality: Right;   SHOULDER ARTHROSCOPY WITH OPEN ROTATOR CUFF REPAIR Right 04/26/2017   Procedure: SHOULDER ARTHROSCOPY WITH OPEN ROTATOR CUFF REPAIR,SUBACROMINAL DECOMPRESSION;  Surgeon: Thornton Park, MD;  Location: [facility] ORS;  Service: Orthopedics;  Laterality: Right;   TONSILLECTOMY      FAMILY HISTORY: Family History  Problem Relation Age of Onset   Breast cancer Mother 68   Diabetes Father    Cancer Maternal Uncle        colon   Breast cancer Maternal Grandmother 56    ADVANCED DIRECTIVES (Y/N):  N  HEALTH MAINTENANCE: Social History   Tobacco Use  Smoking status: Former Smoker    Packs/day: 0.50    Years: 10.00    Pack years: 5.00    Types: Cigarettes    Last attempt to quit: 04/20/2007    Years since quitting: 11.1   Smokeless tobacco: Never Used   Tobacco comment: social smoker back then  Substance Use Topics   Alcohol use: Yes    Comment: occas   Drug use: No     Colonoscopy:  PAP:  Bone density:  Lipid  panel:  Allergies  Allergen Reactions   Hydrocodone-Acetaminophen Hives    swollen face   Iodine Hives   Peanut Butter Flavor Other (See Comments)    Made mouth tingle   Shellfish Allergy Hives    Current Outpatient Medications  Medication Sig Dispense Refill   ALPRAZolam (XANAX) 0.25 MG tablet Take 1 tablet (0.25 mg total) by mouth 2 (two) times daily as needed for anxiety. 60 tablet 0   Ascorbic Acid (VITAMIN C PO) Take 1 tablet by mouth daily.      Cholecalciferol (VITAMIN D) 50 MCG (2000 UT) CAPS Take 2,000 Units by mouth daily.     dexamethasone (DECADRON) 4 MG tablet Take 1 tablet (4 mg total) by mouth daily. 5 tablet 0   EPINEPHrine 0.3 mg/0.3 mL IJ SOAJ injection Inject 0.3 mLs (0.3 mg total) into the muscle once. 1 Device 12   famotidine (PEPCID) 20 MG tablet   0   fluticasone (FLONASE) 50 MCG/ACT nasal spray Place 1 spray into both nostrils daily as needed for allergies or rhinitis.     ibuprofen (ADVIL,MOTRIN) 200 MG tablet Take 400 mg by mouth daily as needed for moderate pain.     L-Methylfolate 15 MG TABS TAKE 1/2 TABLET (7.5 MG) BY MOUTH DAILY 45 tablet 3   lidocaine (XYLOCAINE) 2 % jelly Apply 1 application topically as needed. For pain 30 mL 0   lidocaine-prilocaine (EMLA) cream Apply to affected area once 30 g 3   losartan (COZAAR) 50 MG tablet TAKE 1 TABLET BY MOUTH DAILY 90 tablet 3   magnesium hydroxide (MILK OF MAGNESIA) 400 MG/5ML suspension Take 5 mLs by mouth daily as needed for mild constipation.     mometasone (NASONEX) 50 MCG/ACT nasal spray Place 2 sprays into the nose daily. 17 g 12   montelukast (SINGULAIR) 10 MG tablet TAKE 1 TABLET BY MOUTH AT BEDTIME 30 tablet 12   ondansetron (ZOFRAN) 8 MG tablet Take 1 tablet (8 mg total) by mouth 2 (two) times daily as needed. 30 tablet 2   Probiotic CAPS Take 1 capsule by mouth at bedtime.     prochlorperazine (COMPAZINE) 10 MG tablet Take 1 tablet (10 mg total) by mouth every 6 (six) hours as  needed (Nausea or vomiting). 60 tablet 2   tretinoin (RETIN-A) 0.1 % cream Apply 1 application topically at bedtime.     No current facility-administered medications for this visit.     OBJECTIVE: Vitals:   05/30/18 1007  BP: 133/86  Pulse: 95  Resp: 18     Body mass index is 22.76 kg/m.    ECOG FS:0 - Asymptomatic  General: Well-developed, well-nourished, no acute distress. Eyes: Pink conjunctiva, anicteric sclera. HEENT: Normocephalic, moist mucous membranes. Breast: Exam deferred today. Lungs: Clear to auscultation bilaterally. Heart: Regular rate and rhythm. No rubs, murmurs, or gallops. Abdomen: Soft, nontender, nondistended. No organomegaly noted, normoactive bowel sounds. Musculoskeletal: No edema, cyanosis, or clubbing. Neuro: Alert, answering all questions appropriately. Cranial nerves grossly intact. Skin: No  rashes or petechiae noted. Psych: Normal affect.  LAB RESULTS:  Lab Results  Component Value Date   NA 137 05/30/2018   K 3.6 05/30/2018   CL 103 05/30/2018   CO2 26 05/30/2018   GLUCOSE 92 05/30/2018   BUN 11 05/30/2018   CREATININE 0.52 05/30/2018   CALCIUM 9.5 05/30/2018   PROT 6.6 05/30/2018   ALBUMIN 4.1 05/30/2018   AST 39 05/30/2018   ALT 50 (H) 05/30/2018   ALKPHOS 61 05/30/2018   BILITOT 0.4 05/30/2018   GFRNONAA >60 05/30/2018   GFRAA >60 05/30/2018    Lab Results  Component Value Date   WBC 2.3 (L) 05/30/2018   NEUTROABS 1.2 (L) 05/30/2018   HGB 10.7 (L) 05/30/2018   HCT 30.6 (L) 05/30/2018   MCV 98.7 05/30/2018   PLT 232 05/30/2018     STUDIES: No results found.  ASSESSMENT: Clinical stage IIIB triple negative invasive carcinoma of the lower inner quadrant of the left breast.  PLAN:    1. Clinical stage IIIB triple negative invasive carcinoma of the lower inner quadrant of the left breast: CT scan results from January 01, 2018 did not reveal any metastatic disease.  Given patient's stage of disease, she will benefit from  neoadjuvant chemotherapy using Adriamycin, Cytoxan with Udenyca support followed by carboplatinum and Taxol.  She completed cycle 4 of Adriamycin and Cytoxan on February 14, 2018.  Patient has indicated she may prefer to undergo double mastectomy, therefore adjuvant XRT may not be needed.  An aromatase inhibitor would not offer any benefit given the triple negative status of her disease.  Also discussed today with the possibility of using capecitabine adjuvantly if patient does not have a complete pathologic response.  MUGA scan on January 01, 2018 revealed an EF of 71%.  Proceed with cycle 8, day 1 of carboplatinum and taxol today.  Return to clinic tomorrow for Neulasta and IVF and then in 2 weeks for further evaluation and consideration of cycle 8, day 8. 2.  Anxiety: Chronic.  Continue Xanax as needed. 3.  Anemia: Hemoglobin decreasd slightly to 10.7. Monitor.   4.  Headache: Patient does not complain of this today.  Continue ibuprofen sparingly as needed. 5.  Neutropenia: Resolved. 6.  Weakness and fatigue: Patient does not complain of this today, but will continue with IV fluids on the Friday after her Thursday infusions. 7.  Liver enzymes: Chronic and unchanged. Only ALT remains mildly elevated. 8.  Thrombocytopenia: Resovled.  Patient expressed understanding and was in agreement with this plan. She also understands that She can call clinic at any time with any questions, concerns, or complaints.   Cancer Staging Primary cancer of lower-inner quadrant of left female breast [facility]) Staging form: Breast, AJCC 8th Edition - Clinical stage from 12/24/2017: Stage IIIB (cT2, cN1, cM0, G3, ER-, PR-, HER2-) - Signed by Lloyd Huger, MD on 12/24/2017 - Pathologic: No stage assigned - Unsigned   Lloyd Huger, MD   05/30/2018 3:18 PM

## 2018-05-30 ENCOUNTER — Other Ambulatory Visit: Payer: Self-pay

## 2018-05-30 ENCOUNTER — Inpatient Hospital Stay: Payer: No Typology Code available for payment source

## 2018-05-30 ENCOUNTER — Encounter: Payer: Self-pay | Admitting: Oncology

## 2018-05-30 ENCOUNTER — Inpatient Hospital Stay (HOSPITAL_BASED_OUTPATIENT_CLINIC_OR_DEPARTMENT_OTHER): Payer: No Typology Code available for payment source | Admitting: Oncology

## 2018-05-30 VITALS — BP 133/86 | HR 95 | Resp 18 | Wt 145.3 lb

## 2018-05-30 DIAGNOSIS — C50312 Malignant neoplasm of lower-inner quadrant of left female breast: Secondary | ICD-10-CM

## 2018-05-30 DIAGNOSIS — D649 Anemia, unspecified: Secondary | ICD-10-CM | POA: Diagnosis not present

## 2018-05-30 DIAGNOSIS — Z5111 Encounter for antineoplastic chemotherapy: Secondary | ICD-10-CM | POA: Diagnosis not present

## 2018-05-30 DIAGNOSIS — F419 Anxiety disorder, unspecified: Secondary | ICD-10-CM | POA: Diagnosis not present

## 2018-05-30 DIAGNOSIS — Z171 Estrogen receptor negative status [ER-]: Secondary | ICD-10-CM | POA: Diagnosis not present

## 2018-05-30 DIAGNOSIS — R740 Nonspecific elevation of levels of transaminase and lactic acid dehydrogenase [LDH]: Secondary | ICD-10-CM

## 2018-05-30 LAB — CBC WITH DIFFERENTIAL/PLATELET
Abs Immature Granulocytes: 0.01 10*3/uL (ref 0.00–0.07)
Basophils Absolute: 0 10*3/uL (ref 0.0–0.1)
Basophils Relative: 1 %
Eosinophils Absolute: 0.1 10*3/uL (ref 0.0–0.5)
Eosinophils Relative: 2 %
HCT: 30.6 % — ABNORMAL LOW (ref 36.0–46.0)
Hemoglobin: 10.7 g/dL — ABNORMAL LOW (ref 12.0–15.0)
Immature Granulocytes: 0 %
Lymphocytes Relative: 36 %
Lymphs Abs: 0.8 10*3/uL (ref 0.7–4.0)
MCH: 34.5 pg — ABNORMAL HIGH (ref 26.0–34.0)
MCHC: 35 g/dL (ref 30.0–36.0)
MCV: 98.7 fL (ref 80.0–100.0)
Monocytes Absolute: 0.2 10*3/uL (ref 0.1–1.0)
Monocytes Relative: 9 %
Neutro Abs: 1.2 10*3/uL — ABNORMAL LOW (ref 1.7–7.7)
Neutrophils Relative %: 52 %
Platelets: 232 10*3/uL (ref 150–400)
RBC: 3.1 MIL/uL — ABNORMAL LOW (ref 3.87–5.11)
RDW: 12 % (ref 11.5–15.5)
WBC: 2.3 10*3/uL — ABNORMAL LOW (ref 4.0–10.5)
nRBC: 0 % (ref 0.0–0.2)

## 2018-05-30 LAB — COMPREHENSIVE METABOLIC PANEL
ALT: 50 U/L — ABNORMAL HIGH (ref 0–44)
AST: 39 U/L (ref 15–41)
Albumin: 4.1 g/dL (ref 3.5–5.0)
Alkaline Phosphatase: 61 U/L (ref 38–126)
Anion gap: 8 (ref 5–15)
BUN: 11 mg/dL (ref 6–20)
CO2: 26 mmol/L (ref 22–32)
Calcium: 9.5 mg/dL (ref 8.9–10.3)
Chloride: 103 mmol/L (ref 98–111)
Creatinine, Ser: 0.52 mg/dL (ref 0.44–1.00)
GFR calc Af Amer: >60 mL/min (ref >60)
GFR calc non Af Amer: >60 mL/min (ref >60)
Glucose, Bld: 92 mg/dL (ref 70–99)
Potassium: 3.6 mmol/L (ref 3.5–5.1)
Sodium: 137 mmol/L (ref 135–145)
Total Bilirubin: 0.4 mg/dL (ref 0.3–1.2)
Total Protein: 6.6 g/dL (ref 6.5–8.1)

## 2018-05-30 MED ORDER — HEPARIN SOD (PORK) LOCK FLUSH 100 UNIT/ML IV SOLN
500.0000 [IU] | Freq: Once | INTRAVENOUS | Status: AC | PRN
  Administered 2018-05-30: 500 [IU]

## 2018-05-30 MED ORDER — SODIUM CHLORIDE 0.9 % IV SOLN
Freq: Once | INTRAVENOUS | Status: AC
  Administered 2018-05-30: 11:00:00 via INTRAVENOUS
  Filled 2018-05-30: qty 250

## 2018-05-30 MED ORDER — SODIUM CHLORIDE 0.9% FLUSH
10.0000 mL | Freq: Once | INTRAVENOUS | Status: AC
  Administered 2018-05-30: 09:00:00 10 mL via INTRAVENOUS
  Filled 2018-05-30: qty 10

## 2018-05-30 MED ORDER — SODIUM CHLORIDE 0.9 % IV SOLN
680.0000 mg | Freq: Once | INTRAVENOUS | Status: AC
  Administered 2018-05-30: 14:00:00 680 mg via INTRAVENOUS
  Filled 2018-05-30: qty 68

## 2018-05-30 MED ORDER — DIPHENHYDRAMINE HCL 50 MG/ML IJ SOLN
25.0000 mg | Freq: Once | INTRAMUSCULAR | Status: AC
  Administered 2018-05-30: 11:00:00 25 mg via INTRAVENOUS
  Filled 2018-05-30: qty 1

## 2018-05-30 MED ORDER — SODIUM CHLORIDE 0.9 % IV SOLN
Freq: Once | INTRAVENOUS | Status: AC
  Administered 2018-05-30: 12:00:00 via INTRAVENOUS
  Filled 2018-05-30: qty 5

## 2018-05-30 MED ORDER — SODIUM CHLORIDE 0.9 % IV SOLN
80.0000 mg/m2 | Freq: Once | INTRAVENOUS | Status: AC
  Administered 2018-05-30: 12:00:00 138 mg via INTRAVENOUS
  Filled 2018-05-30: qty 23

## 2018-05-30 MED ORDER — PALONOSETRON HCL INJECTION 0.25 MG/5ML
0.2500 mg | Freq: Once | INTRAVENOUS | Status: AC
  Administered 2018-05-30: 11:00:00 0.25 mg via INTRAVENOUS
  Filled 2018-05-30: qty 5

## 2018-05-30 MED ORDER — HEPARIN SOD (PORK) LOCK FLUSH 100 UNIT/ML IV SOLN
500.0000 [IU] | Freq: Once | INTRAVENOUS | Status: AC
  Filled 2018-05-30: qty 5

## 2018-05-30 MED ORDER — DEXAMETHASONE SODIUM PHOSPHATE 10 MG/ML IJ SOLN: 10.0000 mg | Freq: Once | INTRAMUSCULAR | Status: DC

## 2018-05-30 MED ORDER — FAMOTIDINE IN NACL 20-0.9 MG/50ML-% IV SOLN
20.0000 mg | Freq: Once | INTRAVENOUS | Status: AC
  Administered 2018-05-30: 20 mg via INTRAVENOUS
  Filled 2018-05-30: qty 50

## 2018-05-30 NOTE — Progress Notes (Signed)
Patient denies any concerns today.  

## 2018-05-31 ENCOUNTER — Inpatient Hospital Stay: Payer: No Typology Code available for payment source | Attending: Oncology

## 2018-05-31 ENCOUNTER — Inpatient Hospital Stay: Payer: No Typology Code available for payment source

## 2018-05-31 ENCOUNTER — Other Ambulatory Visit: Payer: Self-pay

## 2018-05-31 VITALS — BP 131/85 | HR 86 | Temp 97.7°F | Resp 18

## 2018-05-31 DIAGNOSIS — C50312 Malignant neoplasm of lower-inner quadrant of left female breast: Secondary | ICD-10-CM | POA: Diagnosis present

## 2018-05-31 DIAGNOSIS — Z5189 Encounter for other specified aftercare: Secondary | ICD-10-CM | POA: Diagnosis not present

## 2018-05-31 DIAGNOSIS — D709 Neutropenia, unspecified: Secondary | ICD-10-CM | POA: Diagnosis not present

## 2018-05-31 DIAGNOSIS — Z5111 Encounter for antineoplastic chemotherapy: Secondary | ICD-10-CM | POA: Insufficient documentation

## 2018-05-31 MED ORDER — PEGFILGRASTIM-CBQV 6 MG/0.6ML ~~LOC~~ SOSY
6.0000 mg | PREFILLED_SYRINGE | Freq: Once | SUBCUTANEOUS | Status: AC
  Administered 2018-05-31: 6 mg via SUBCUTANEOUS
  Filled 2018-05-31: qty 0.6

## 2018-05-31 MED ORDER — HEPARIN SOD (PORK) LOCK FLUSH 100 UNIT/ML IV SOLN
500.0000 [IU] | Freq: Once | INTRAVENOUS | Status: AC
  Administered 2018-05-31: 500 [IU] via INTRAVENOUS

## 2018-05-31 MED ORDER — SODIUM CHLORIDE 0.9 % IV SOLN
Freq: Once | INTRAVENOUS | Status: AC
  Administered 2018-05-31: 14:00:00 via INTRAVENOUS
  Filled 2018-05-31: qty 250

## 2018-05-31 MED ORDER — SODIUM CHLORIDE 0.9% FLUSH
10.0000 mL | INTRAVENOUS | Status: DC | PRN
  Administered 2018-05-31: 10 mL via INTRAVENOUS
  Filled 2018-05-31: qty 10

## 2018-05-31 MED FILL — LOSARTAN POTASSIUM 50 MG TA: 50 | 30 days supply | Qty: 30 | Fill #1

## 2018-06-11 ENCOUNTER — Telehealth: Payer: Self-pay | Admitting: *Deleted

## 2018-06-11 ENCOUNTER — Telehealth: Payer: Self-pay | Admitting: Nurse Practitioner

## 2018-06-11 MED ORDER — ALPRAZOLAM 0.5 MG PO TABS: 0.2500 mg | ORAL_TABLET | Freq: Two times a day (BID) | ORAL | 0 refills | Status: AC | PRN

## 2018-06-11 NOTE — Telephone Encounter (Signed)
Dr. Bary Castilla will see patient in clinic tomorrow. Xanax prescription adjusted to 0.5 mg tablets, She can take 1/2 tablet (0.25 mg) to 1 tablet (0.5 mg) BID prn for anxiety. Prescription sent to pharmacy.

## 2018-06-11 NOTE — Telephone Encounter (Signed)
Patient called reporting that she is having severe pain at her breast tumor site which started after her last treatment. She is concerned with the amt of pain she is having and has an appointment Thursday with Dr Grayland Ormond, but would like to speak with someone about this ASAP. Please advise

## 2018-06-11 NOTE — Telephone Encounter (Signed)
I called patient to follow up. She says that she developed pain in the area which has worsened throughout the day. She accounts some of this to her high level of anxiety as her pain improved somewhat with xanax. She says that she is now able to feel the mass at her tumor site and she was not previously able to feel it. She is very concerned that the chemotherapy is not working and would like to be seen. Dr. Grayland Ormond recommends evaluation by Dr. Bary Castilla if possible. I have reached out to Dr. Bary Castilla to see if he would be able to see her in clinic to possibly ultrasound the site and am awaiting to hear back then I'll advise patient.

## 2018-06-12 ENCOUNTER — Other Ambulatory Visit: Payer: Self-pay

## 2018-06-12 ENCOUNTER — Ambulatory Visit (INDEPENDENT_AMBULATORY_CARE_PROVIDER_SITE_OTHER): Payer: No Typology Code available for payment source

## 2018-06-12 ENCOUNTER — Encounter: Payer: Self-pay | Admitting: General Surgery

## 2018-06-12 ENCOUNTER — Ambulatory Visit (INDEPENDENT_AMBULATORY_CARE_PROVIDER_SITE_OTHER): Payer: No Typology Code available for payment source | Admitting: General Surgery

## 2018-06-12 VITALS — BP 167/71 | HR 91 | Temp 97.5°F | Resp 14 | Ht 66.0 in | Wt 140.0 lb

## 2018-06-12 DIAGNOSIS — C50312 Malignant neoplasm of lower-inner quadrant of left female breast: Secondary | ICD-10-CM

## 2018-06-12 NOTE — Progress Notes (Signed)
[facility]  [phone] [phone]  ID: Shelby [name] OB: 07/22/1970  MR#: [id number]  CSN#:[id number]  Patient Care Team: Gilbert, Richard L Jr., MD as PCP - General (Family Medicine) Gollan, Timothy J, MD as Consulting Physician (Cardiology)  CHIEF COMPLAINT: Clinical stage IIIB triple negative invasive carcinoma of the lower inner quadrant of the left breast.  INTERVAL HISTORY: Patient returns to clinic today for further evaluation and consideration of cycle 8, day 8 of carboplatinum and Taxol.  Taxol only today.  She was evaluated by surgery yesterday and ultrasound of her breast revealed approximately 30% reduction in her mass.  She continues to be highly anxious, but otherwise feels well. She does not complain of weakness or fatigue today. She denies any recent fevers or illnesses.  She has no neurologic complaints.  She has a good appetite and denies weight loss.  She denies any chest pain, shortness of breath, cough, or hemoptysis.  She denies any nausea, vomiting, constipation, or diarrhea.  She has no urinary complaints.  Patient offers no further specific complaints today.  REVIEW OF SYSTEMS:   Review of Systems  Constitutional: Negative.  Negative for fever, malaise/fatigue and weight loss.  HENT: Negative.  Negative for congestion, ear pain and sore throat.   Respiratory: Negative.  Negative for cough, hemoptysis and shortness of breath.   Cardiovascular: Negative.  Negative for chest pain and leg swelling.  Gastrointestinal: Negative.  Negative for abdominal pain and melena.  Genitourinary: Negative.  Negative for dysuria.  Musculoskeletal: Negative.  Negative for back pain.  Skin: Negative.  Negative for rash.  Neurological: Negative.  Negative for dizziness, focal weakness, weakness and headaches.  Psychiatric/Behavioral: The patient is nervous/anxious.     As per HPI. Otherwise, a complete review of systems is negative.  PAST  MEDICAL HISTORY: Past Medical History:  Diagnosis Date  . Anemia    h/o with pregnancy  . Anxiety   . Asthma    allergy induced-no inhaler  . Cancer ([facility])   . Complication of anesthesia   . Environmental allergies   . Family history of adverse reaction to anesthesia    son-breathing problems-coded 1st time when he was 2 and had another surgery at 11 and had to be admitted for breathing problems  . Family history of breast cancer   . GERD (gastroesophageal reflux disease)   . Headache    migraines  . Hypertension   . Mitral valve disease   . Mitral valve prolapse   . Painful menstrual periods   . PONV (postoperative nausea and vomiting)   . Vertigo     PAST SURGICAL HISTORY: Past Surgical History:  Procedure Laterality Date  . ABDOMINAL HYSTERECTOMY  2010   supracervical   . BUNIONECTOMY Bilateral   . MANDIBLE RECONSTRUCTION     age 17-underbite  . PORTACATH PLACEMENT Right 12/31/2017   Procedure: INSERTION PORT-A-CATH;  Surgeon: Byrnett, Jeffrey W, MD;  Location: [facility] ORS;  Service: General;  Laterality: Right;  . SHOULDER ARTHROSCOPY WITH OPEN ROTATOR CUFF REPAIR Right 04/26/2017   Procedure: SHOULDER ARTHROSCOPY WITH OPEN ROTATOR CUFF REPAIR,SUBACROMINAL DECOMPRESSION;  Surgeon: Krasinski, Kevin, MD;  Location: [facility] ORS;  Service: Orthopedics;  Laterality: Right;  . TONSILLECTOMY      FAMILY HISTORY: Family History  Problem Relation Age of Onset  . Breast cancer Mother 55  . Diabetes Father   . Cancer Maternal Uncle        colon  . Breast cancer Maternal Grandmother 70      ADVANCED DIRECTIVES (Y/N):  N  HEALTH MAINTENANCE: Social History   Tobacco Use  . Smoking status: Former Smoker    Packs/day: 0.50    Years: 10.00    Pack years: 5.00    Types: Cigarettes    Last attempt to quit: 04/20/2007    Years since quitting: 11.1  . Smokeless tobacco: Never Used  . Tobacco comment: social smoker back then  Substance Use Topics  . Alcohol use: Yes    Comment:  occas  . Drug use: No     Colonoscopy:  PAP:  Bone density:  Lipid panel:  Allergies  Allergen Reactions  . Hydrocodone-Acetaminophen Hives    swollen face  . Iodine Hives  . Peanut Butter Flavor Other (See Comments)    Made mouth tingle  . Shellfish Allergy Hives    Current Outpatient Medications  Medication Sig Dispense Refill  . ALPRAZolam (XANAX) 0.5 MG tablet Take 0.5-1 tablets (0.25-0.5 mg total) by mouth 2 (two) times daily as needed for anxiety. 60 tablet 0  . Ascorbic Acid (VITAMIN C PO) Take 1 tablet by mouth daily.     . Cholecalciferol (VITAMIN D) 50 MCG (2000 UT) CAPS Take 2,000 Units by mouth daily.    Marland Kitchen dexamethasone (DECADRON) 4 MG tablet Take 1 tablet (4 mg total) by mouth daily. 5 tablet 0  . EPINEPHrine 0.3 mg/0.3 mL IJ SOAJ injection Inject 0.3 mLs (0.3 mg total) into the muscle once. 1 Device 12  . famotidine (PEPCID) 20 MG tablet   0  . fluticasone (FLONASE) 50 MCG/ACT nasal spray Place 1 spray into both nostrils daily as needed for allergies or rhinitis.    Marland Kitchen ibuprofen (ADVIL,MOTRIN) 200 MG tablet Take 400 mg by mouth daily as needed for moderate pain.    Marland Kitchen L-Methylfolate 15 MG TABS TAKE 1/2 TABLET (7.5 MG) BY MOUTH DAILY 45 tablet 3  . lidocaine (XYLOCAINE) 2 % jelly Apply 1 application topically as needed. For pain 30 mL 0  . lidocaine-prilocaine (EMLA) cream Apply to affected area once 30 g 3  . losartan (COZAAR) 50 MG tablet TAKE 1 TABLET BY MOUTH DAILY 90 tablet 3  . magnesium hydroxide (MILK OF MAGNESIA) 400 MG/5ML suspension Take 5 mLs by mouth daily as needed for mild constipation.    . mometasone (NASONEX) 50 MCG/ACT nasal spray Place 2 sprays into the nose daily. 17 g 12  . montelukast (SINGULAIR) 10 MG tablet TAKE 1 TABLET BY MOUTH AT BEDTIME 30 tablet 12  . ondansetron (ZOFRAN) 8 MG tablet Take 1 tablet (8 mg total) by mouth 2 (two) times daily as needed. 30 tablet 2  . Probiotic CAPS Take 1 capsule by mouth at bedtime.    . prochlorperazine  (COMPAZINE) 10 MG tablet Take 1 tablet (10 mg total) by mouth every 6 (six) hours as needed (Nausea or vomiting). 60 tablet 2  . tretinoin (RETIN-A) 0.1 % cream Apply 1 application topically at bedtime.     No current facility-administered medications for this visit.     OBJECTIVE: Vitals:   06/13/18 0858  BP: 136/89  Pulse: 88  Temp: 98.3 F (36.8 C)     Body mass index is 22.08 kg/m.    ECOG FS:0 - Asymptomatic  General: Well-developed, well-nourished, no acute distress. Eyes: Pink conjunctiva, anicteric sclera. HEENT: Normocephalic, moist mucous membranes. Breast: Exam deferred today.  Ultrasound completed yesterday. Lungs: Clear to auscultation bilaterally. Heart: Regular rate and rhythm. No rubs, murmurs, or gallops. Abdomen: Soft, nontender, nondistended. No  organomegaly noted, normoactive bowel sounds. Musculoskeletal: No edema, cyanosis, or clubbing. Neuro: Alert, answering all questions appropriately. Cranial nerves grossly intact. Skin: No rashes or petechiae noted. Psych: Normal affect.  LAB RESULTS:  Lab Results  Component Value Date   NA 136 06/13/2018   K 3.9 06/13/2018   CL 102 06/13/2018   CO2 27 06/13/2018   GLUCOSE 104 (H) 06/13/2018   BUN 15 06/13/2018   CREATININE 0.44 06/13/2018   CALCIUM 9.6 06/13/2018   PROT 7.2 06/13/2018   ALBUMIN 4.3 06/13/2018   AST 33 06/13/2018   ALT 38 06/13/2018   ALKPHOS 90 06/13/2018   BILITOT 0.5 06/13/2018   GFRNONAA >60 06/13/2018   GFRAA >60 06/13/2018    Lab Results  Component Value Date   WBC 4.5 06/13/2018   NEUTROABS 3.0 06/13/2018   HGB 10.9 (L) 06/13/2018   HCT 31.2 (L) 06/13/2018   MCV 98.7 06/13/2018   PLT 118 (L) 06/13/2018     STUDIES: Us Breast Complete Uni Left Inc Axilla  Result Date: 06/12/2018 Ultrasound examination of the left breast was undertaken to assess for changes in size of the previously noted cancer.  At the 7 o'clock position, 7 cm from the nipple a hypoechoic mass with  posterior acoustic shadowing and evidence of a previously placed clip is evident.  This measures 0.87 x 1.03 x 1.18 cm.  Prior to the initiation of therapy in November 2019 this area measured 0.8 1.3 cm.  The satellite lesion notified on the November 2019 images is no longer evident.  Vascular flow is no longer noted within the lesion.  Examination of the axilla showed no discernible adenopathy.  Review of the November 2019 study showed the node measuring up to 11 mm in diameter.  A clip is not evident.  (Hydro-Mark).  BI-RADS-6.   ASSESSMENT: Clinical stage IIIB triple negative invasive carcinoma of the lower inner quadrant of the left breast.  PLAN:    1. Clinical stage IIIB triple negative invasive carcinoma of the lower inner quadrant of the left breast: CT scan results from January 01, 2018 did not reveal any metastatic disease.  Given patient's stage of disease, she will benefit from neoadjuvant chemotherapy using Adriamycin, Cytoxan with Udenyca support followed by carboplatinum and Taxol.  She completed cycle 4 of Adriamycin and Cytoxan on February 14, 2018.  An aromatase inhibitor would not offer any benefit given the triple negative status of her disease.  Also discussed today with the possibility of using capecitabine adjuvantly if patient does not have a complete pathologic response.  MUGA scan on January 01, 2018 revealed an EF of 71%.  Proceed with cycle 8, day 8 of carboplatinum and Taxol today.  Taxol only.  Return to clinic in 1 week for further evaluation and consideration of cycle 8, day 15 which will be her final treatment day.  Her surgery will likely be scheduled in mid to late June.   2.  Anxiety: Chronic.  Continue Xanax as needed. 3.  Anemia: Chronic and unchanged.  Hemoglobin 10.9 today.  Monitor. 4.  Headache: Patient does not complain of this today.  Continue ibuprofen sparingly as needed. 5.  Neutropenia: Resolved.  Proceed with treatment as above. 6.  Weakness and fatigue:  Patient does not complain of this today, but will continue with IV fluids on the Friday after her Thursday infusions. 7.  Liver enzymes: Resolved. 8.  Thrombocytopenia: Mild, proceed with treatment as above.  Patient expressed understanding and was in agreement with this   plan. She also understands that She can call clinic at any time with any questions, concerns, or complaints.   Cancer Staging Primary cancer of lower-inner quadrant of left female breast [facility]) Staging form: Breast, AJCC 8th Edition - Clinical stage from 12/24/2017: Stage IIIB (cT2, cN1, cM0, G3, ER-, PR-, HER2-) - Signed by Lloyd Huger, MD on 12/24/2017 - Pathologic: No stage assigned - Unsigned   Lloyd Huger, MD   06/13/2018 9:23 AM

## 2018-06-12 NOTE — Progress Notes (Signed)
Patient ID: Shelby [name], female   DOB: 09-17-1970, 48 y.o.   MRN: [id number]  Chief Complaint  Patient presents with  . Follow-up    left breast cancer    HPI Shelby [name] is a 48 y.o. female  The patient reported that the palpable mass in the lower inner quadrant of the left breast had essentially resolved on her own exam after completion of [facility] treatment.  She is now on her last 2 carbotaxol treatments and within the last week she again noticed a mass in the area concerning for tumor regrowth.  The patient reports she is experience pain at the original tumor site with each chemotherapy treatment which she thought was related to the agents working against the cancer cells.  The reappearance, on her own exam, of the mass has provided great anxiety.  She has had to have her treatments held for 1 week due to neutropenia, but she has a treatment tomorrow and next week which will complete her planned therapy run prior to surgery.   The patient reports her greatest anxiety is that her younger son age 53 would be raised by his alcoholic father if she were to succumb to her disease.  Past Medical History:  Diagnosis Date  . Anemia    h/o with pregnancy  . Anxiety   . Asthma    allergy induced-no inhaler  . Cancer ([place])   . Complication of anesthesia   . Environmental allergies   . Family history of adverse reaction to anesthesia    son-breathing problems-coded 1st time when he was 2 and had another surgery at 50 and had to be admitted for breathing problems  . Family history of breast cancer   . GERD (gastroesophageal reflux disease)   . Headache    migraines  . Hypertension   . Mitral valve disease   . Mitral valve prolapse   . Painful menstrual periods   . PONV (postoperative nausea and vomiting)   . Vertigo     Past Surgical History:  Procedure Laterality Date  . ABDOMINAL HYSTERECTOMY  2010   supracervical   . BUNIONECTOMY Bilateral   . MANDIBLE RECONSTRUCTION      age 85-underbite  . PORTACATH PLACEMENT Right 12/31/2017   Procedure: INSERTION PORT-A-CATH;  Surgeon: Robert Bellow, MD;  Location: [facility] ORS;  Service: General;  Laterality: Right;  . SHOULDER ARTHROSCOPY WITH OPEN ROTATOR CUFF REPAIR Right 04/26/2017   Procedure: SHOULDER ARTHROSCOPY WITH OPEN ROTATOR CUFF REPAIR,SUBACROMINAL DECOMPRESSION;  Surgeon: Thornton Park, MD;  Location: [facility] ORS;  Service: Orthopedics;  Laterality: Right;  . TONSILLECTOMY      Family History  Problem Relation Age of Onset  . Breast cancer Mother 108  . Diabetes Father   . Cancer Maternal Uncle        colon  . Breast cancer Maternal Grandmother 70    Social History Social History   Tobacco Use  . Smoking status: Former Smoker    Packs/day: 0.50    Years: 10.00    Pack years: 5.00    Types: Cigarettes    Last attempt to quit: 04/20/2007    Years since quitting: 11.1  . Smokeless tobacco: Never Used  . Tobacco comment: social smoker back then  Substance Use Topics  . Alcohol use: Yes    Comment: occas  . Drug use: No    Allergies  Allergen Reactions  . Hydrocodone-Acetaminophen Hives    swollen face  . Iodine Hives  . Peanut Butter  Flavor Other (See Comments)    Made mouth tingle  . Shellfish Allergy Hives    Current Outpatient Medications  Medication Sig Dispense Refill  . ALPRAZolam (XANAX) 0.5 MG tablet Take 0.5-1 tablets (0.25-0.5 mg total) by mouth 2 (two) times daily as needed for anxiety. 60 tablet 0  . Ascorbic Acid (VITAMIN C PO) Take 1 tablet by mouth daily.     . Cholecalciferol (VITAMIN D) 50 MCG (2000 UT) CAPS Take 2,000 Units by mouth daily.    Marland Kitchen dexamethasone (DECADRON) 4 MG tablet Take 1 tablet (4 mg total) by mouth daily. 5 tablet 0  . EPINEPHrine 0.3 mg/0.3 mL IJ SOAJ injection Inject 0.3 mLs (0.3 mg total) into the muscle once. 1 Device 12  . famotidine (PEPCID) 20 MG tablet   0  . fluticasone (FLONASE) 50 MCG/ACT nasal spray Place 1 spray into both nostrils  daily as needed for allergies or rhinitis.    Marland Kitchen ibuprofen (ADVIL,MOTRIN) 200 MG tablet Take 400 mg by mouth daily as needed for moderate pain.    Marland Kitchen L-Methylfolate 15 MG TABS TAKE 1/2 TABLET (7.5 MG) BY MOUTH DAILY 45 tablet 3  . lidocaine (XYLOCAINE) 2 % jelly Apply 1 application topically as needed. For pain 30 mL 0  . lidocaine-prilocaine (EMLA) cream Apply to affected area once 30 g 3  . losartan (COZAAR) 50 MG tablet TAKE 1 TABLET BY MOUTH DAILY 90 tablet 3  . magnesium hydroxide (MILK OF MAGNESIA) 400 MG/5ML suspension Take 5 mLs by mouth daily as needed for mild constipation.    . mometasone (NASONEX) 50 MCG/ACT nasal spray Place 2 sprays into the nose daily. 17 g 12  . montelukast (SINGULAIR) 10 MG tablet TAKE 1 TABLET BY MOUTH AT BEDTIME 30 tablet 12  . ondansetron (ZOFRAN) 8 MG tablet Take 1 tablet (8 mg total) by mouth 2 (two) times daily as needed. 30 tablet 2  . Probiotic CAPS Take 1 capsule by mouth at bedtime.    . prochlorperazine (COMPAZINE) 10 MG tablet Take 1 tablet (10 mg total) by mouth every 6 (six) hours as needed (Nausea or vomiting). 60 tablet 2  . tretinoin (RETIN-A) 0.1 % cream Apply 1 application topically at bedtime.     No current facility-administered medications for this visit.     Review of Systems Review of Systems  Constitutional: Negative.   Respiratory: Negative.   Cardiovascular: Negative.     Blood pressure (!) 167/71, pulse 91, temperature (!) 97.5 F (36.4 C), resp. rate 14, height 5\' 6"  (1.676 m), weight 140 lb (63.5 kg), SpO2 96 %.  Physical Exam Physical Exam Exam conducted with a chaperone present.  Constitutional:      Appearance: She is well-developed.  Eyes:     General: No scleral icterus.    Conjunctiva/sclera: Conjunctivae normal.  Neck:     Musculoskeletal: Neck supple.  Cardiovascular:     Rate and Rhythm: Normal rate and regular rhythm.     Heart sounds: Normal heart sounds.  Pulmonary:     Effort: Pulmonary effort is  normal.     Breath sounds: Normal breath sounds.  Chest:     Breasts:        Right: No inverted nipple, mass, nipple discharge, skin change or tenderness.        Left: Mass (7 o'clk, 7 cm from the nipple ) and tenderness present. No inverted nipple, nipple discharge or skin change.    Lymphadenopathy:     Cervical: No cervical adenopathy.  Upper Body:     Right upper body: No axillary adenopathy.     Left upper body: No axillary adenopathy.  Skin:    General: Skin is warm and dry.  Neurological:     Mental Status: She is alert and oriented to person, place, and time.     Data Reviewed Ultrasound examination of the left breast was undertaken to assess for changes in size of the previously noted cancer.  At the 7 o'clock position, 7 cm from the nipple a hypoechoic mass with posterior acoustic shadowing and evidence of a previously placed clip is evident.  This measures 0.87 x 1.03 x 1.18 cm.  Prior to the initiation of therapy in November 2019 this area measured 0.8 1.3 cm.  The satellite lesion notified on the November 2019 images is no longer evident.  Vascular flow is no longer noted within the lesion.  Examination of the axilla showed no discernible adenopathy.  Review of the November 2019 study showed the node measuring up to 11 mm in diameter.  A clip is not evident.  [facility]).   Assessment Interval decrease in size of primary tumor (30%), no ongoing visualization of the previously biopsied positive axillary node.  Plan  It is difficult to explain the patient's reported change in physical exam based on today's imaging and the lack of vascular flow which would suggest a lesion that is "burnt out".  I spoke with Dr. Grayland Ormond, who reported that if she has residual disease at the time of surgery (patient presently planning for mastectomy) she might be a candidate for additional chemotherapy.  I had a detailed discussion discussion with the patient regarding her initial request  for bilateral mastectomy and reconstruction.  As there has clinically not been a complete pathologic response, there is probably a 50% chance that she has residual disease.  Major, bilateral surgery with implant placement could put her at risk for delayed additional treatment if this was indicated.  At this time the patient is comfortable with the idea of mastectomy and then returning for reconstruction and contralateral mastectomy, if she insists, at a later date.  She is due to finish her chemotherapy next week.  Ideally we plan for mastectomy within 4 weeks of completion.  We will plan for repeat exam in 3 weeks to discuss and finalize surgical options.   HPI, Physical Exam, Assessment and Plan have been scribed under the direction and in the presence of Robert Bellow, MD  Concepcion Living, LPN  I have completed the exam and reviewed the above documentation for accuracy and completeness.  I agree with the above.  Haematologist has been used and any errors in dictation or transcription are unintentional.  Hervey Ard, M.D., F.A.C.S.  Forest Gleason Polo Mcmartin 06/12/2018, 8:19 PM

## 2018-06-12 NOTE — Patient Instructions (Signed)
Follow up here in 3 weeks to discuss surgery.

## 2018-06-13 ENCOUNTER — Encounter: Payer: Self-pay | Admitting: Oncology

## 2018-06-13 ENCOUNTER — Inpatient Hospital Stay: Payer: No Typology Code available for payment source

## 2018-06-13 ENCOUNTER — Other Ambulatory Visit: Payer: Self-pay

## 2018-06-13 ENCOUNTER — Inpatient Hospital Stay (HOSPITAL_BASED_OUTPATIENT_CLINIC_OR_DEPARTMENT_OTHER): Payer: No Typology Code available for payment source | Admitting: Oncology

## 2018-06-13 VITALS — Resp 17

## 2018-06-13 VITALS — BP 136/89 | HR 88 | Temp 98.3°F | Ht 67.0 in | Wt 141.0 lb

## 2018-06-13 DIAGNOSIS — Z87891 Personal history of nicotine dependence: Secondary | ICD-10-CM

## 2018-06-13 DIAGNOSIS — C50312 Malignant neoplasm of lower-inner quadrant of left female breast: Secondary | ICD-10-CM | POA: Diagnosis not present

## 2018-06-13 DIAGNOSIS — Z95828 Presence of other vascular implants and grafts: Secondary | ICD-10-CM

## 2018-06-13 DIAGNOSIS — D649 Anemia, unspecified: Secondary | ICD-10-CM

## 2018-06-13 DIAGNOSIS — Z5111 Encounter for antineoplastic chemotherapy: Secondary | ICD-10-CM | POA: Diagnosis not present

## 2018-06-13 DIAGNOSIS — D696 Thrombocytopenia, unspecified: Secondary | ICD-10-CM | POA: Diagnosis not present

## 2018-06-13 DIAGNOSIS — Z171 Estrogen receptor negative status [ER-]: Secondary | ICD-10-CM | POA: Diagnosis not present

## 2018-06-13 DIAGNOSIS — F419 Anxiety disorder, unspecified: Secondary | ICD-10-CM

## 2018-06-13 LAB — COMPREHENSIVE METABOLIC PANEL
ALT: 38 U/L (ref 0–44)
AST: 33 U/L (ref 15–41)
Albumin: 4.3 g/dL (ref 3.5–5.0)
Alkaline Phosphatase: 90 U/L (ref 38–126)
Anion gap: 7 (ref 5–15)
BUN: 15 mg/dL (ref 6–20)
CO2: 27 mmol/L (ref 22–32)
Calcium: 9.6 mg/dL (ref 8.9–10.3)
Chloride: 102 mmol/L (ref 98–111)
Creatinine, Ser: 0.44 mg/dL (ref 0.44–1.00)
GFR calc Af Amer: >60 mL/min (ref >60)
GFR calc non Af Amer: >60 mL/min (ref >60)
Glucose, Bld: 104 mg/dL — ABNORMAL HIGH (ref 70–99)
Potassium: 3.9 mmol/L (ref 3.5–5.1)
Sodium: 136 mmol/L (ref 135–145)
Total Bilirubin: 0.5 mg/dL (ref 0.3–1.2)
Total Protein: 7.2 g/dL (ref 6.5–8.1)

## 2018-06-13 LAB — CBC WITH DIFFERENTIAL/PLATELET
Abs Immature Granulocytes: 0.01 10*3/uL (ref 0.00–0.07)
Basophils Absolute: 0 10*3/uL (ref 0.0–0.1)
Basophils Relative: 0 %
Eosinophils Absolute: 0 10*3/uL (ref 0.0–0.5)
Eosinophils Relative: 0 %
HCT: 31.2 % — ABNORMAL LOW (ref 36.0–46.0)
Hemoglobin: 10.9 g/dL — ABNORMAL LOW (ref 12.0–15.0)
Immature Granulocytes: 0 %
Lymphocytes Relative: 23 %
Lymphs Abs: 1 10*3/uL (ref 0.7–4.0)
MCH: 34.5 pg — ABNORMAL HIGH (ref 26.0–34.0)
MCHC: 34.9 g/dL (ref 30.0–36.0)
MCV: 98.7 fL (ref 80.0–100.0)
Monocytes Absolute: 0.4 10*3/uL (ref 0.1–1.0)
Monocytes Relative: 9 %
Neutro Abs: 3 10*3/uL (ref 1.7–7.7)
Neutrophils Relative %: 68 %
Platelets: 118 10*3/uL — ABNORMAL LOW (ref 150–400)
RBC: 3.16 MIL/uL — ABNORMAL LOW (ref 3.87–5.11)
RDW: 12.4 % (ref 11.5–15.5)
WBC: 4.5 10*3/uL (ref 4.0–10.5)
nRBC: 0 % (ref 0.0–0.2)

## 2018-06-13 LAB — SAMPLE TO BLOOD BANK

## 2018-06-13 MED ORDER — DEXAMETHASONE SODIUM PHOSPHATE 10 MG/ML IJ SOLN
10.0000 mg | Freq: Once | INTRAMUSCULAR | Status: AC
  Administered 2018-06-13: 10 mg via INTRAVENOUS
  Filled 2018-06-13: qty 1

## 2018-06-13 MED ORDER — SODIUM CHLORIDE 0.9% FLUSH
10.0000 mL | Freq: Once | INTRAVENOUS | Status: AC
  Administered 2018-06-13: 09:00:00 10 mL via INTRAVENOUS
  Filled 2018-06-13: qty 10

## 2018-06-13 MED ORDER — FAMOTIDINE IN NACL 20-0.9 MG/50ML-% IV SOLN
20.0000 mg | Freq: Once | INTRAVENOUS | Status: AC
  Administered 2018-06-13: 20 mg via INTRAVENOUS
  Filled 2018-06-13: qty 50

## 2018-06-13 MED ORDER — SODIUM CHLORIDE 0.9 % IV SOLN
80.0000 mg/m2 | Freq: Once | INTRAVENOUS | Status: AC
  Administered 2018-06-13: 138 mg via INTRAVENOUS
  Filled 2018-06-13: qty 23

## 2018-06-13 MED ORDER — HEPARIN SOD (PORK) LOCK FLUSH 100 UNIT/ML IV SOLN
500.0000 [IU] | Freq: Once | INTRAVENOUS | Status: AC | PRN
  Administered 2018-06-13: 500 [IU]
  Filled 2018-06-13: qty 5

## 2018-06-13 MED ORDER — SODIUM CHLORIDE 0.9 % IV SOLN
Freq: Once | INTRAVENOUS | Status: AC
  Administered 2018-06-13: 10:00:00 via INTRAVENOUS
  Filled 2018-06-13: qty 250

## 2018-06-13 MED ORDER — DIPHENHYDRAMINE HCL 50 MG/ML IJ SOLN
25.0000 mg | Freq: Once | INTRAMUSCULAR | Status: AC
  Administered 2018-06-13: 25 mg via INTRAVENOUS
  Filled 2018-06-13: qty 1

## 2018-06-13 NOTE — Progress Notes (Signed)
Patient stated that she had been doing better. Patient stated that her left breast continues to ache but it is not as bad as it you to be.

## 2018-06-20 ENCOUNTER — Inpatient Hospital Stay (HOSPITAL_BASED_OUTPATIENT_CLINIC_OR_DEPARTMENT_OTHER): Payer: No Typology Code available for payment source | Admitting: Oncology

## 2018-06-20 ENCOUNTER — Other Ambulatory Visit: Payer: Self-pay

## 2018-06-20 ENCOUNTER — Inpatient Hospital Stay: Payer: No Typology Code available for payment source

## 2018-06-20 ENCOUNTER — Encounter: Payer: Self-pay | Admitting: Oncology

## 2018-06-20 VITALS — BP 124/88 | HR 103 | Temp 98.3°F | Ht 66.0 in | Wt 146.0 lb

## 2018-06-20 DIAGNOSIS — D649 Anemia, unspecified: Secondary | ICD-10-CM | POA: Diagnosis not present

## 2018-06-20 DIAGNOSIS — C50312 Malignant neoplasm of lower-inner quadrant of left female breast: Secondary | ICD-10-CM

## 2018-06-20 DIAGNOSIS — Z5111 Encounter for antineoplastic chemotherapy: Secondary | ICD-10-CM | POA: Diagnosis not present

## 2018-06-20 DIAGNOSIS — D696 Thrombocytopenia, unspecified: Secondary | ICD-10-CM

## 2018-06-20 DIAGNOSIS — Z87891 Personal history of nicotine dependence: Secondary | ICD-10-CM

## 2018-06-20 DIAGNOSIS — D709 Neutropenia, unspecified: Secondary | ICD-10-CM

## 2018-06-20 DIAGNOSIS — Z171 Estrogen receptor negative status [ER-]: Secondary | ICD-10-CM | POA: Diagnosis not present

## 2018-06-20 DIAGNOSIS — F419 Anxiety disorder, unspecified: Secondary | ICD-10-CM

## 2018-06-20 LAB — COMPREHENSIVE METABOLIC PANEL
ALT: 46 U/L — ABNORMAL HIGH (ref 0–44)
AST: 32 U/L (ref 15–41)
Albumin: 4.1 g/dL (ref 3.5–5.0)
Alkaline Phosphatase: 71 U/L (ref 38–126)
Anion gap: 6 (ref 5–15)
BUN: 17 mg/dL (ref 6–20)
CO2: 27 mmol/L (ref 22–32)
Calcium: 9.5 mg/dL (ref 8.9–10.3)
Chloride: 105 mmol/L (ref 98–111)
Creatinine, Ser: 0.51 mg/dL (ref 0.44–1.00)
GFR calc Af Amer: >60 mL/min (ref >60)
GFR calc non Af Amer: >60 mL/min (ref >60)
Glucose, Bld: 94 mg/dL (ref 70–99)
Potassium: 4.2 mmol/L (ref 3.5–5.1)
Sodium: 138 mmol/L (ref 135–145)
Total Bilirubin: 0.6 mg/dL (ref 0.3–1.2)
Total Protein: 6.7 g/dL (ref 6.5–8.1)

## 2018-06-20 LAB — CBC WITH DIFFERENTIAL/PLATELET
Abs Immature Granulocytes: 0.01 10*3/uL (ref 0.00–0.07)
Basophils Absolute: 0 10*3/uL (ref 0.0–0.1)
Basophils Relative: 1 %
Eosinophils Absolute: 0 10*3/uL (ref 0.0–0.5)
Eosinophils Relative: 1 %
HCT: 28.4 % — ABNORMAL LOW (ref 36.0–46.0)
Hemoglobin: 9.9 g/dL — ABNORMAL LOW (ref 12.0–15.0)
Immature Granulocytes: 0 %
Lymphocytes Relative: 32 %
Lymphs Abs: 1 10*3/uL (ref 0.7–4.0)
MCH: 33.9 pg (ref 26.0–34.0)
MCHC: 34.9 g/dL (ref 30.0–36.0)
MCV: 97.3 fL (ref 80.0–100.0)
Monocytes Absolute: 0.2 10*3/uL (ref 0.1–1.0)
Monocytes Relative: 7 %
Neutro Abs: 1.8 10*3/uL (ref 1.7–7.7)
Neutrophils Relative %: 59 %
Platelets: 103 10*3/uL — ABNORMAL LOW (ref 150–400)
RBC: 2.92 MIL/uL — ABNORMAL LOW (ref 3.87–5.11)
RDW: 12.5 % (ref 11.5–15.5)
WBC: 3 10*3/uL — ABNORMAL LOW (ref 4.0–10.5)
nRBC: 0 % (ref 0.0–0.2)

## 2018-06-20 LAB — SAMPLE TO BLOOD BANK

## 2018-06-20 MED ORDER — SODIUM CHLORIDE 0.9 % IV SOLN
80.0000 mg/m2 | Freq: Once | INTRAVENOUS | Status: AC
  Administered 2018-06-20: 138 mg via INTRAVENOUS
  Filled 2018-06-20: qty 23

## 2018-06-20 MED ORDER — FAMOTIDINE IN NACL 20-0.9 MG/50ML-% IV SOLN
20.0000 mg | Freq: Once | INTRAVENOUS | Status: AC
  Administered 2018-06-20: 20 mg via INTRAVENOUS
  Filled 2018-06-20: qty 50

## 2018-06-20 MED ORDER — HEPARIN SOD (PORK) LOCK FLUSH 100 UNIT/ML IV SOLN
500.0000 [IU] | Freq: Once | INTRAVENOUS | Status: AC | PRN
  Administered 2018-06-20: 500 [IU]
  Filled 2018-06-20: qty 5

## 2018-06-20 MED ORDER — DEXAMETHASONE SODIUM PHOSPHATE 10 MG/ML IJ SOLN
10.0000 mg | Freq: Once | INTRAMUSCULAR | Status: AC
  Administered 2018-06-20: 10 mg via INTRAVENOUS
  Filled 2018-06-20: qty 1

## 2018-06-20 MED ORDER — SODIUM CHLORIDE 0.9 % IV SOLN
Freq: Once | INTRAVENOUS | Status: AC
  Administered 2018-06-20: 11:00:00 via INTRAVENOUS
  Filled 2018-06-20: qty 250

## 2018-06-20 MED ORDER — DIPHENHYDRAMINE HCL 50 MG/ML IJ SOLN
25.0000 mg | Freq: Once | INTRAMUSCULAR | Status: AC
  Administered 2018-06-20: 25 mg via INTRAVENOUS
  Filled 2018-06-20: qty 1

## 2018-06-20 NOTE — Progress Notes (Signed)
Patient is here to follow up on her left breast cancer. Patient stated that she continues to have an ache on her left breast but its much better. Patient will see Dr. Fleet Contras on 07/02/2018 to discuss surgery date.

## 2018-06-20 NOTE — Progress Notes (Signed)
[place]  Telephone:([phone] Fax:([phone]) [phone]  ID: Shelby [name] OB: 04/30/1970  MR#: [id number]  EQA#:[id number]  Patient Care Team: Jerrol Banana., MD as PCP - General (Family Medicine) [name], MD as Consulting Physician (Cardiology)  CHIEF COMPLAINT: Clinical stage IIIB triple negative invasive carcinoma of the lower inner quadrant of the left breast.  INTERVAL HISTORY: Patient returns to clinic today for further evaluation and consideration of cycle 8, day 15 of carboplatinum and Taxol.  Taxol only today.  She continues to be highly anxious, but otherwise feels well.  She does not complain of weakness or fatigue today. She denies any recent fevers or illnesses.  She has no neurologic complaints.  She has a good appetite and denies weight loss.  She denies any chest pain, shortness of breath, cough, or hemoptysis.  She denies any nausea, vomiting, constipation, or diarrhea.  She has no urinary complaints.  Patient offers no further specific complaints today.  REVIEW OF SYSTEMS:   Review of Systems  Constitutional: Negative.  Negative for fever, malaise/fatigue and weight loss.  HENT: Negative.  Negative for congestion, ear pain and sore throat.   Respiratory: Negative.  Negative for cough, hemoptysis and shortness of breath.   Cardiovascular: Negative.  Negative for chest pain and leg swelling.  Gastrointestinal: Negative.  Negative for abdominal pain and melena.  Genitourinary: Negative.  Negative for dysuria.  Musculoskeletal: Negative.  Negative for back pain.  Skin: Negative.  Negative for rash.  Neurological: Negative.  Negative for dizziness, focal weakness, weakness and headaches.  Psychiatric/Behavioral: The patient is nervous/anxious.     As per HPI. Otherwise, a complete review of systems is negative.  PAST MEDICAL HISTORY: Past Medical History:  Diagnosis Date   Anemia    h/o with pregnancy   Anxiety    Asthma      allergy induced-no inhaler   Cancer ([place])    Complication of anesthesia    Environmental allergies    Family history of adverse reaction to anesthesia    son-breathing problems-coded 1st time when he was 2 and had another surgery at 29 and had to be admitted for breathing problems   Family history of breast cancer    GERD (gastroesophageal reflux disease)    Headache    migraines   Hypertension    Mitral valve disease    Mitral valve prolapse    Painful menstrual periods    PONV (postoperative nausea and vomiting)    Vertigo     PAST SURGICAL HISTORY: Past Surgical History:  Procedure Laterality Date   ABDOMINAL HYSTERECTOMY  2010   supracervical    BUNIONECTOMY Bilateral    MANDIBLE RECONSTRUCTION     age 66-underbite   PORTACATH PLACEMENT Right 12/31/2017   Procedure: INSERTION PORT-A-CATH;  Surgeon: Robert Bellow, MD;  Location: [facility] ORS;  Service: General;  Laterality: Right;   SHOULDER ARTHROSCOPY WITH OPEN ROTATOR CUFF REPAIR Right 04/26/2017   Procedure: SHOULDER ARTHROSCOPY WITH OPEN ROTATOR CUFF REPAIR,SUBACROMINAL DECOMPRESSION;  Surgeon: Thornton Park, MD;  Location: [facility] ORS;  Service: Orthopedics;  Laterality: Right;   TONSILLECTOMY      FAMILY HISTORY: Family History  Problem Relation Age of Onset   Breast cancer Mother 35   Diabetes Father    Cancer Maternal Uncle        colon   Breast cancer Maternal Grandmother 32    ADVANCED DIRECTIVES (Y/N):  N  HEALTH MAINTENANCE: Social History   Tobacco Use  Smoking status: Former Smoker    Packs/day: 0.50    Years: 10.00    Pack years: 5.00    Types: Cigarettes    Last attempt to quit: 04/20/2007    Years since quitting: 11.1   Smokeless tobacco: Never Used   Tobacco comment: social smoker back then  Substance Use Topics   Alcohol use: Yes    Comment: occas   Drug use: No     Colonoscopy:  PAP:  Bone density:  Lipid panel:  Allergies  Allergen Reactions    Hydrocodone-Acetaminophen Hives    swollen face   Iodine Hives   Peanut Butter Flavor Other (See Comments)    Made mouth tingle   Shellfish Allergy Hives    Current Outpatient Medications  Medication Sig Dispense Refill   ALPRAZolam (XANAX) 0.5 MG tablet Take 0.5-1 tablets (0.25-0.5 mg total) by mouth 2 (two) times daily as needed for anxiety. 60 tablet 0   Ascorbic Acid (VITAMIN C PO) Take 1 tablet by mouth daily.      Cholecalciferol (VITAMIN D) 50 MCG (2000 UT) CAPS Take 2,000 Units by mouth daily.     dexamethasone (DECADRON) 4 MG tablet Take 1 tablet (4 mg total) by mouth daily. 5 tablet 0   EPINEPHrine 0.3 mg/0.3 mL IJ SOAJ injection Inject 0.3 mLs (0.3 mg total) into the muscle once. 1 Device 12   famotidine (PEPCID) 20 MG tablet   0   fluticasone (FLONASE) 50 MCG/ACT nasal spray Place 1 spray into both nostrils daily as needed for allergies or rhinitis.     ibuprofen (ADVIL,MOTRIN) 200 MG tablet Take 400 mg by mouth daily as needed for moderate pain.     L-Methylfolate 15 MG TABS TAKE 1/2 TABLET (7.5 MG) BY MOUTH DAILY 45 tablet 3   lidocaine (XYLOCAINE) 2 % jelly Apply 1 application topically as needed. For pain 30 mL 0   lidocaine-prilocaine (EMLA) cream Apply to affected area once 30 g 3   losartan (COZAAR) 50 MG tablet TAKE 1 TABLET BY MOUTH DAILY 90 tablet 3   magnesium hydroxide (MILK OF MAGNESIA) 400 MG/5ML suspension Take 5 mLs by mouth daily as needed for mild constipation.     mometasone (NASONEX) 50 MCG/ACT nasal spray Place 2 sprays into the nose daily. 17 g 12   montelukast (SINGULAIR) 10 MG tablet TAKE 1 TABLET BY MOUTH AT BEDTIME 30 tablet 12   ondansetron (ZOFRAN) 8 MG tablet Take 1 tablet (8 mg total) by mouth 2 (two) times daily as needed. 30 tablet 2   Probiotic CAPS Take 1 capsule by mouth at bedtime.     prochlorperazine (COMPAZINE) 10 MG tablet Take 1 tablet (10 mg total) by mouth every 6 (six) hours as needed (Nausea or vomiting). 60  tablet 2   tretinoin (RETIN-A) 0.1 % cream Apply 1 application topically at bedtime.     No current facility-administered medications for this visit.     OBJECTIVE: Vitals:   06/20/18 0943  BP: 124/88  Pulse: (!) 103  Temp: 98.3 F (36.8 C)     Body mass index is 23.57 kg/m.    ECOG FS:0 - Asymptomatic  General: Well-developed, well-nourished, no acute distress. Eyes: Pink conjunctiva, anicteric sclera. HEENT: Normocephalic, moist mucous membranes. Breasts: Exam deferred today. Lungs: Clear to auscultation bilaterally. Heart: Regular rate and rhythm. No rubs, murmurs, or gallops. Abdomen: Soft, nontender, nondistended. No organomegaly noted, normoactive bowel sounds. Musculoskeletal: No edema, cyanosis, or clubbing. Neuro: Alert, answering all questions appropriately. Cranial nerves  grossly intact. Skin: No rashes or petechiae noted. Psych: Normal affect.  LAB RESULTS:  Lab Results  Component Value Date   NA 138 06/20/2018   K 4.2 06/20/2018   CL 105 06/20/2018   CO2 27 06/20/2018   GLUCOSE 94 06/20/2018   BUN 17 06/20/2018   CREATININE 0.51 06/20/2018   CALCIUM 9.5 06/20/2018   PROT 6.7 06/20/2018   ALBUMIN 4.1 06/20/2018   AST 32 06/20/2018   ALT 46 (H) 06/20/2018   ALKPHOS 71 06/20/2018   BILITOT 0.6 06/20/2018   GFRNONAA >60 06/20/2018   GFRAA >60 06/20/2018    Lab Results  Component Value Date   WBC 3.0 (L) 06/20/2018   NEUTROABS 1.8 06/20/2018   HGB 9.9 (L) 06/20/2018   HCT 28.4 (L) 06/20/2018   MCV 97.3 06/20/2018   PLT 103 (L) 06/20/2018     STUDIES: US Breast Complete Uni Left Inc Axilla  Result Date: 06/12/2018 Ultrasound examination of the left breast was undertaken to assess for changes in size of the previously noted cancer.  At the 7 o'clock position, 7 cm from the nipple a hypoechoic mass with posterior acoustic shadowing and evidence of a previously placed clip is evident.  This measures 0.87 x 1.03 x 1.18 cm.  Prior to the initiation  of therapy in November 2019 this area measured 0.8 1.3 cm.  The satellite lesion notified on the November 2019 images is no longer evident.  Vascular flow is no longer noted within the lesion.  Examination of the axilla showed no discernible adenopathy.  Review of the November 2019 study showed the node measuring up to 11 mm in diameter.  A clip is not evident.  [facility]).  BI-RADS-6.   ASSESSMENT: Clinical stage IIIB triple negative invasive carcinoma of the lower inner quadrant of the left breast.  PLAN:    1. Clinical stage IIIB triple negative invasive carcinoma of the lower inner quadrant of the left breast: CT scan results from January 01, 2018 did not reveal any metastatic disease.  Given patient's stage of disease, she will benefit from neoadjuvant chemotherapy using Adriamycin, Cytoxan with Udenyca support followed by carboplatinum and Taxol.  She completed cycle 4 of Adriamycin and Cytoxan on February 14, 2018.  An aromatase inhibitor would not offer any benefit given the triple negative status of her disease.  Also discussed today with the possibility of using capecitabine adjuvantly if patient does not have a complete pathologic response.  MUGA scan on January 01, 2018 revealed an EF of 71%.  Proceed with cycle 8, day 15 of carboplatinum and Taxol today.  Taxol only.  Patient has now completed her neoadjuvant chemotherapy and will proceed with surgery in approximately 4 weeks.  Patient will return to clinic 1 to 2 weeks after her surgery to discuss the final pathology results and additional treatment planning if necessary.  If patient undergoes lumpectomy she will also require adjuvant XRT.     2.  Anxiety: Chronic.  Continue Xanax as needed. 3.  Anemia: Chronic and unchanged.  Hemoglobin is trended down to 9.9 today.  Monitor. 4.  Headache: Patient does not complain of this today.  Continue ibuprofen sparingly as needed. 5.  Neutropenia: Mild.  Proceed with treatment as above. 6.  Weakness  and fatigue: Patient does not complain of this today, but will continue with IV fluids on the Friday after her Thursday infusions. 7.  Liver enzymes: Resolved. 8.  Thrombocytopenia: Platelet count is 103 today.  Proceed with treatment as above.  Patient expressed understanding and was in agreement with this plan. She also understands that She can call clinic at any time with any questions, concerns, or complaints.   Cancer Staging Primary cancer of lower-inner quadrant of left female breast [facility]) Staging form: Breast, AJCC 8th Edition - Clinical stage from 12/24/2017: Stage IIIB (cT2, cN1, cM0, G3, ER-, PR-, HER2-) - Signed by Lloyd Huger, MD on 12/24/2017 - Pathologic: No stage assigned - Unsigned   Lloyd Huger, MD   06/22/2018 9:20 AM

## 2018-07-01 ENCOUNTER — Other Ambulatory Visit: Payer: Self-pay | Admitting: Family Medicine

## 2018-07-01 MED FILL — LOSARTAN POTASSIUM 50 MG TA: 50 | 30 days supply | Qty: 30 | Fill #0

## 2018-07-02 ENCOUNTER — Ambulatory Visit (INDEPENDENT_AMBULATORY_CARE_PROVIDER_SITE_OTHER): Payer: No Typology Code available for payment source | Admitting: General Surgery

## 2018-07-02 ENCOUNTER — Other Ambulatory Visit: Payer: Self-pay

## 2018-07-02 ENCOUNTER — Encounter: Payer: Self-pay | Admitting: General Surgery

## 2018-07-02 VITALS — BP 119/87 | HR 106 | Temp 97.3°F | Ht 66.0 in | Wt 147.0 lb

## 2018-07-02 DIAGNOSIS — C50312 Malignant neoplasm of lower-inner quadrant of left female breast: Secondary | ICD-10-CM

## 2018-07-02 NOTE — Patient Instructions (Signed)
Patient's surgery to be scheduled for 07/12/18 at [facility] Dr. Bary Castilla.  The patient is aware to have COVID-19 testing done on 07/09/18 at the [facility] building drive thru ([address] [place]) between 10:30 am and 12:30 pm. she is aware to isolate after, have no visitors, wash hands frequently, and avoid touching face.   The patient is aware she will need to Pre-Admit. Patient will check in at the [facility] entrance due to COVID-19 restrictions and will then be escorted to the [facility], [address] (first floor). Patient will be contacted once Pre-admission appointment has been arranged with date and time.   Patient aware to be NPO after midnight and have a driver.   She is aware to check in at the [facility] entrance where he/she will be screened for the coronavirus and then sent to [facility].   Patient aware that she may have no visitors and driver will need to wait in the car due to COVID-19 restrictions.   The patient verbalizes understanding of the above.   The patient is aware to call the office should she have further questions.

## 2018-07-02 NOTE — Progress Notes (Signed)
Patient ID: Shelby [name], female   DOB: 05-Mar-1970, 48 y.o.   MRN: [id number]  Chief Complaint  Patient presents with  . Other    Discuss surgery    HPI Shelby [name] is a 48 y.o. female here today to discuss surgery now that she is completed neoadjuvant chemotherapy for her triple negative tumor. The patient is feeling well. HPI  Past Medical History:  Diagnosis Date  . Anemia    h/o with pregnancy  . Anxiety   . Asthma    allergy induced-no inhaler  . Cancer ([place])   . Complication of anesthesia   . Environmental allergies   . Family history of adverse reaction to anesthesia    son-breathing problems-coded 1st time when he was 2 and had another surgery at 21 and had to be admitted for breathing problems  . Family history of breast cancer   . GERD (gastroesophageal reflux disease)   . Headache    migraines  . Hypertension   . Mitral valve disease   . Mitral valve prolapse   . Painful menstrual periods   . PONV (postoperative nausea and vomiting)   . Vertigo     Past Surgical History:  Procedure Laterality Date  . ABDOMINAL HYSTERECTOMY  2010   supracervical   . BUNIONECTOMY Bilateral   . MANDIBLE RECONSTRUCTION     age 59-underbite  . PORTACATH PLACEMENT Right 12/31/2017   Procedure: INSERTION PORT-A-CATH;  Surgeon: Robert Bellow, MD;  Location: [facility] ORS;  Service: General;  Laterality: Right;  . SHOULDER ARTHROSCOPY WITH OPEN ROTATOR CUFF REPAIR Right 04/26/2017   Procedure: SHOULDER ARTHROSCOPY WITH OPEN ROTATOR CUFF REPAIR,SUBACROMINAL DECOMPRESSION;  Surgeon: Thornton Park, MD;  Location: [facility] ORS;  Service: Orthopedics;  Laterality: Right;  . TONSILLECTOMY      Family History  Problem Relation Age of Onset  . Breast cancer Mother 61  . Diabetes Father   . Cancer Maternal Uncle        colon  . Breast cancer Maternal Grandmother 70    Social History Social History   Tobacco Use  . Smoking status: Former Smoker    Packs/day: 0.50     Years: 10.00    Pack years: 5.00    Types: Cigarettes    Last attempt to quit: 04/20/2007    Years since quitting: 11.2  . Smokeless tobacco: Never Used  . Tobacco comment: social smoker back then  Substance Use Topics  . Alcohol use: Yes    Comment: occas  . Drug use: No    Allergies  Allergen Reactions  . Hydrocodone-Acetaminophen Hives    swollen face  . Iodine Hives  . Peanut Butter Flavor Other (See Comments)    Made mouth tingle  . Shellfish Allergy Hives    Current Outpatient Medications  Medication Sig Dispense Refill  . ALPRAZolam (XANAX) 0.5 MG tablet Take 0.5-1 tablets (0.25-0.5 mg total) by mouth 2 (two) times daily as needed for anxiety. 60 tablet 0  . Ascorbic Acid (VITAMIN C PO) Take 1 tablet by mouth daily.     . Cholecalciferol (VITAMIN D) 50 MCG (2000 UT) CAPS Take 2,000 Units by mouth daily.    Marland Kitchen dexamethasone (DECADRON) 4 MG tablet Take 1 tablet (4 mg total) by mouth daily. 5 tablet 0  . EPINEPHrine 0.3 mg/0.3 mL IJ SOAJ injection Inject 0.3 mLs (0.3 mg total) into the muscle once. 1 Device 12  . famotidine (PEPCID) 20 MG tablet   0  . fluticasone (FLONASE) 50  MCG/ACT nasal spray Place 1 spray into both nostrils daily as needed for allergies or rhinitis.    Marland Kitchen ibuprofen (ADVIL,MOTRIN) 200 MG tablet Take 400 mg by mouth daily as needed for moderate pain.    Marland Kitchen L-Methylfolate 15 MG TABS TAKE 1/2 TABLET (7.5 MG) BY MOUTH DAILY 45 tablet 3  . lidocaine (XYLOCAINE) 2 % jelly Apply 1 application topically as needed. For pain 30 mL 0  . lidocaine-prilocaine (EMLA) cream Apply to affected area once 30 g 3  . losartan (COZAAR) 50 MG tablet TAKE 1 TABLET BY MOUTH DAILY 90 tablet 0  . magnesium hydroxide (MILK OF MAGNESIA) 400 MG/5ML suspension Take 5 mLs by mouth daily as needed for mild constipation.    . mometasone (NASONEX) 50 MCG/ACT nasal spray Place 2 sprays into the nose daily. 17 g 12  . montelukast (SINGULAIR) 10 MG tablet TAKE 1 TABLET BY MOUTH AT BEDTIME 30  tablet 12  . ondansetron (ZOFRAN) 8 MG tablet Take 1 tablet (8 mg total) by mouth 2 (two) times daily as needed. 30 tablet 2  . Probiotic CAPS Take 1 capsule by mouth at bedtime.    . prochlorperazine (COMPAZINE) 10 MG tablet Take 1 tablet (10 mg total) by mouth every 6 (six) hours as needed (Nausea or vomiting). 60 tablet 2  . tretinoin (RETIN-A) 0.1 % cream Apply 1 application topically at bedtime.     No current facility-administered medications for this visit.     Review of Systems Review of Systems  Constitutional: Negative.   Respiratory: Negative.   Cardiovascular: Negative.     Blood pressure 119/87, pulse (!) 106, temperature (!) 97.3 F (36.3 C), temperature source Skin, height 5\' 6"  (1.676 m), weight 147 lb (66.7 kg), SpO2 97 %.  Physical Exam Physical Exam Exam conducted with a chaperone present.  Constitutional:      Appearance: Normal appearance. She is well-developed.  Eyes:     General: No scleral icterus.    Conjunctiva/sclera: Conjunctivae normal.  Neck:     Musculoskeletal: Neck supple.  Cardiovascular:     Rate and Rhythm: Normal rate and regular rhythm.     Heart sounds: Normal heart sounds.  Pulmonary:     Effort: Pulmonary effort is normal.     Breath sounds: Normal breath sounds.  Chest:     Breasts:        Right: Normal.        Left: Normal.    Lymphadenopathy:     Cervical: No cervical adenopathy.     Upper Body:     Right upper body: No supraclavicular or axillary adenopathy.     Left upper body: No supraclavicular or axillary adenopathy.  Skin:    General: Skin is warm and dry.  Neurological:     Mental Status: She is alert and oriented to person, place, and time.     Data Reviewed Pre-treatment ultrasound of December 17, 2017 showed a 10.5 mm lymph node with cortical thickening of 5 mm.  Biopsy positive with placement of a hydro-Mark clip.  CT of January 01, 2018 showed no adenopathy.  Clip not evident.  Office ultrasound of Jun 12, 2018 showed no discernible axillary adenopathy and no evidence of the previously placed guidewire clip.  The primary tumor had decreased by about 30% in volume.  Assessment Triple negative cancer status post neoadjuvant treatment.  Plan Options for management were reviewed. The majority of the visit was spent reviewing the options for breast cancer treatment. Breast  conservation with lumpectomy and radiation therapy  was presented as equivalent to mastectomy for long-term control. The pros and cons of each treatment regimen were reviewed. The indications for additional therapy such as postoperative chemotherapy were reviewed, and plans for contralateral surgery put on hold at this time.  Reviewing the need to have negative margins, the patient volunteered that if ectomy was necessary, she was accepting of this treatment regimen.  The goal to remove the clipped lymph node was reviewed.  As it was not evident on office ultrasound last month, will ask for radiology to review the images and see if wire localization can be completed.  On her study for which the biopsy was done, a clear location in relation to other landmarks of the axilla was not provided.  If the area cannot be localized, may consider axillary dissection as there does not appear to be complete pathologic response at the primary site.  Plans for intraoperative review discussed.  The importance of using EMLA cream the morning of surgery on the areola was reviewed.  The patient was very calm, cool and collected at today's visit.  She will likely make use of Xanax, 0.5 mg the morning of the procedure to minimize stress during the technetium injection and the hopeful wire localization of the axillary lymph node.   HPI, Physical Exam, Assessment and Plan have been scribed under the direction and in the presence of Hervey Ard, MD.  Gaspar Cola, CMA  Patient's surgery to be scheduled for 07/12/18 at [facility] Dr.  Bary Castilla.  The patient is aware to have COVID-19 testing done on 07/09/18 at the [facility] building drive thru ([address] [place]) between 10:30 am and 12:30 pm. she is aware to isolate after, have no visitors, wash hands frequently, and avoid touching face.   The patient is aware she will need to Pre-Admit. Patient will check in at the [place] entrance due to COVID-19 restrictions and will then be escorted to the [facility], [address] (first floor). Patient will be contacted once Pre-admission appointment has been arranged with date and time.   Patient aware to be NPO after midnight and have a driver.   She is aware to check in at the [place] entrance where he/she will be screened for the coronavirus and then sent to Same Day Surgery.   Patient aware that she may have no visitors and driver will need to wait in the car due to COVID-19 restrictions.   The patient verbalizes understanding of the above.   The patient is aware to call the office should she have further questions.  Documented by Lesly Rubenstein LPN  I have completed the exam and reviewed the above documentation for accuracy and completeness.  I agree with the above.  Haematologist has been used and any errors in dictation or transcription are unintentional.  Hervey Ard, M.D., F.A.C.S.  Forest Gleason Tikia Skilton 07/02/2018, 7:33 PM

## 2018-07-03 ENCOUNTER — Other Ambulatory Visit: Payer: Self-pay | Admitting: General Surgery

## 2018-07-03 ENCOUNTER — Other Ambulatory Visit: Payer: Self-pay

## 2018-07-03 DIAGNOSIS — C50312 Malignant neoplasm of lower-inner quadrant of left female breast: Secondary | ICD-10-CM

## 2018-07-05 ENCOUNTER — Telehealth: Payer: Self-pay

## 2018-07-05 NOTE — Telephone Encounter (Signed)
Patient notified that she will Pre Admit at [facility] on 07/09/18 at 9:00 am. She will have her Covid testing done at that time. She will arrive at the hospital the morning of surgery at 7:45 am and enter through the [facility], [facility], [facility].

## 2018-07-09 ENCOUNTER — Encounter
Admission: RE | Admit: 2018-07-09 | Discharge: 2018-07-09 | Disposition: A | Discharge status: Home / Self Care | Payer: No Typology Code available for payment source | Source: Ambulatory Visit | Attending: General Surgery | Admitting: General Surgery

## 2018-07-09 ENCOUNTER — Other Ambulatory Visit: Payer: Self-pay

## 2018-07-09 DIAGNOSIS — Z87891 Personal history of nicotine dependence: Secondary | ICD-10-CM | POA: Diagnosis not present

## 2018-07-09 DIAGNOSIS — C50912 Malignant neoplasm of unspecified site of left female breast: Secondary | ICD-10-CM | POA: Diagnosis present

## 2018-07-09 DIAGNOSIS — Z888 Allergy status to other drugs, medicaments and biological substances status: Secondary | ICD-10-CM | POA: Diagnosis not present

## 2018-07-09 DIAGNOSIS — I341 Nonrheumatic mitral (valve) prolapse: Secondary | ICD-10-CM | POA: Diagnosis not present

## 2018-07-09 DIAGNOSIS — Z79899 Other long term (current) drug therapy: Secondary | ICD-10-CM | POA: Diagnosis not present

## 2018-07-09 DIAGNOSIS — Z171 Estrogen receptor negative status [ER-]: Secondary | ICD-10-CM | POA: Diagnosis not present

## 2018-07-09 DIAGNOSIS — Z803 Family history of malignant neoplasm of breast: Secondary | ICD-10-CM | POA: Diagnosis not present

## 2018-07-09 DIAGNOSIS — Z7951 Long term (current) use of inhaled steroids: Secondary | ICD-10-CM | POA: Diagnosis not present

## 2018-07-09 DIAGNOSIS — J45909 Unspecified asthma, uncomplicated: Secondary | ICD-10-CM | POA: Diagnosis not present

## 2018-07-09 DIAGNOSIS — Z1159 Encounter for screening for other viral diseases: Secondary | ICD-10-CM | POA: Diagnosis not present

## 2018-07-09 DIAGNOSIS — C773 Secondary and unspecified malignant neoplasm of axilla and upper limb lymph nodes: Secondary | ICD-10-CM | POA: Diagnosis not present

## 2018-07-09 DIAGNOSIS — Z885 Allergy status to narcotic agent status: Secondary | ICD-10-CM | POA: Diagnosis not present

## 2018-07-09 DIAGNOSIS — Z01812 Encounter for preprocedural laboratory examination: Secondary | ICD-10-CM | POA: Insufficient documentation

## 2018-07-09 DIAGNOSIS — K219 Gastro-esophageal reflux disease without esophagitis: Secondary | ICD-10-CM | POA: Diagnosis not present

## 2018-07-09 DIAGNOSIS — F419 Anxiety disorder, unspecified: Secondary | ICD-10-CM | POA: Diagnosis not present

## 2018-07-09 DIAGNOSIS — I10 Essential (primary) hypertension: Secondary | ICD-10-CM | POA: Diagnosis not present

## 2018-07-09 LAB — DIFFERENTIAL
Abs Immature Granulocytes: 0 10*3/uL (ref 0.00–0.07)
Basophils Absolute: 0 10*3/uL (ref 0.0–0.1)
Basophils Relative: 1 %
Eosinophils Absolute: 0.1 10*3/uL (ref 0.0–0.5)
Eosinophils Relative: 2 %
Immature Granulocytes: 0 %
Lymphocytes Relative: 38 %
Lymphs Abs: 0.9 10*3/uL (ref 0.7–4.0)
Monocytes Absolute: 0.2 10*3/uL (ref 0.1–1.0)
Monocytes Relative: 10 %
Neutro Abs: 1.2 10*3/uL — ABNORMAL LOW (ref 1.7–7.7)
Neutrophils Relative %: 49 %

## 2018-07-09 LAB — CBC
HCT: 33.6 % — ABNORMAL LOW (ref 36.0–46.0)
Hemoglobin: 11.1 g/dL — ABNORMAL LOW (ref 12.0–15.0)
MCH: 33 pg (ref 26.0–34.0)
MCHC: 33 g/dL (ref 30.0–36.0)
MCV: 100 fL (ref 80.0–100.0)
Platelets: 182 10*3/uL (ref 150–400)
RBC: 3.36 MIL/uL — ABNORMAL LOW (ref 3.87–5.11)
RDW: 12.9 % (ref 11.5–15.5)
WBC: 2.4 10*3/uL — ABNORMAL LOW (ref 4.0–10.5)
nRBC: 0 % (ref 0.0–0.2)

## 2018-07-09 NOTE — Patient Instructions (Signed)
Your procedure is scheduled on: Friday 6/12 Report to Mammography. At 7:45   Remember: Instructions that are not followed completely may result in serious medical risk,  up to and including death, or upon the discretion of your surgeon and anesthesiologist your  surgery may need to be rescheduled.     _X__ 1. Do not eat food after midnight the night before your procedure.                 No gum chewing or hard candies. You may drink clear liquids up to 2 hours                 before you are scheduled to arrive for your surgery- DO not drink clear                 liquids within 2 hours of the start of your surgery.                 Clear Liquids include:  water, apple juice without pulp, clear carbohydrate                 drink such as Clearfast of Gatorade, Black Coffee or Tea (Do not add                 anything to coffee or tea).  __X__2.  On the morning of surgery brush your teeth with toothpaste and water, you                may rinse your mouth with mouthwash if you wish.  Do not swallow any toothpaste of mouthwash.     _X__ 3.  No Alcohol for 24 hours before or after surgery.   ___ 4.  Do Not Smoke or use e-cigarettes For 24 Hours Prior to Your Surgery.                 Do not use any chewable tobacco products for at least 6 hours prior to                 surgery.  ____  5.  Bring all medications with you on the day of surgery if instructed.   _x___  6.  Notify your doctor if there is any change in your medical condition      (cold, fever, infections).     Do not wear jewelry, make-up, hairpins, clips or nail polish. Do not wear lotions, powders, or perfumes. You may wear deodorant. Do not shave 48 hours prior to surgery. Men may shave face and neck. Do not bring valuables to the hospital.    [facility] is not responsible for any belongings or valuables.  Contacts, dentures or bridgework may not be worn into surgery. Leave your suitcase in the car.  After surgery it may be brought to your room. For patients admitted to the hospital, discharge time is determined by your treatment team.   Patients discharged the day of surgery will not be allowed to drive home.   Please read over the following fact sheets that you were given:    _x___ Take these medicines the morning of surgery with A SIP OF WATER:    1. ALPRAZolam (XANAX) 0.5 MG tablet if needed  2. famotidine (PEPCID) 20 MG tablet the night before and morning of surgery  3.   4.  5.  6.  ____ Fleet Enema (as directed)   __x__ Use CHG Soap as directed  ____ Use inhalers on the  day of surgery  ____ Stop metformin 2 days prior to surgery    ____ Take 1/2 of usual insulin dose the night before surgery. No insulin the morning          of surgery.   ____ Stop Coumadin/Plavix/aspirin on   __x__ Stop Anti-inflammatories on ibuprofen or Aleve Aspirin   ____ Stop supplements until after surgery.    ____ Bring C-Pap to the hospital.

## 2018-07-10 LAB — NOVEL CORONAVIRUS, NAA (HOSP ORDER, SEND-OUT TO REF LAB; TAT 18-24 HRS): SARS-CoV-2, NAA: NOT DETECTED

## 2018-07-10 NOTE — Pre-Procedure Instructions (Signed)
DR Bary Castilla OK WITH LABS

## 2018-07-10 NOTE — Pre-Procedure Instructions (Signed)
FOUND TICK ON HER. OK TO TAKE BENADRLY. INSTRUCTED TO LET DR Curly [place] OFFICE KNOW. OK FOR ANESTHESIA UNLESS SYMPTOMS DEVELOP

## 2018-07-11 ENCOUNTER — Encounter: Payer: Self-pay | Admitting: *Deleted

## 2018-07-11 MED ORDER — CEFAZOLIN SODIUM-DEXTROSE 2-4 GM/100ML-% IV SOLN
2.0000 g | INTRAVENOUS | Status: AC
  Administered 2018-07-12: 2 g via INTRAVENOUS

## 2018-07-11 NOTE — Progress Notes (Signed)
Called patient today.  Obtained verbal consent to nominate her for [facility] for them to be able to contact her.  She is agreeable.  She is scheduled for surgery tomorrow.  She is to call with any questions or needs.

## 2018-07-12 ENCOUNTER — Encounter: Payer: Self-pay | Admitting: Certified Registered Nurse Anesthetist

## 2018-07-12 ENCOUNTER — Ambulatory Visit
Admission: RE | Admit: 2018-07-12 | Discharge: 2018-07-12 | Disposition: A | Discharge status: Home / Self Care | Payer: No Typology Code available for payment source | Attending: General Surgery | Admitting: General Surgery

## 2018-07-12 ENCOUNTER — Ambulatory Visit
Admission: RE | Admit: 2018-07-12 | Discharge: 2018-07-12 | Disposition: A | Discharge status: Home / Self Care | Payer: No Typology Code available for payment source | Source: Ambulatory Visit | Attending: General Surgery | Admitting: General Surgery

## 2018-07-12 ENCOUNTER — Other Ambulatory Visit: Payer: Self-pay

## 2018-07-12 ENCOUNTER — Other Ambulatory Visit: Payer: Self-pay | Admitting: General Surgery

## 2018-07-12 ENCOUNTER — Ambulatory Visit: Payer: No Typology Code available for payment source | Admitting: Anesthesiology

## 2018-07-12 ENCOUNTER — Encounter
Admission: RE | Disposition: A | Discharge status: Home / Self Care | Payer: Self-pay | Source: Home / Self Care | Attending: General Surgery

## 2018-07-12 DIAGNOSIS — Z79899 Other long term (current) drug therapy: Secondary | ICD-10-CM | POA: Insufficient documentation

## 2018-07-12 DIAGNOSIS — I341 Nonrheumatic mitral (valve) prolapse: Secondary | ICD-10-CM | POA: Insufficient documentation

## 2018-07-12 DIAGNOSIS — J45909 Unspecified asthma, uncomplicated: Secondary | ICD-10-CM | POA: Insufficient documentation

## 2018-07-12 DIAGNOSIS — Z885 Allergy status to narcotic agent status: Secondary | ICD-10-CM | POA: Insufficient documentation

## 2018-07-12 DIAGNOSIS — I10 Essential (primary) hypertension: Secondary | ICD-10-CM | POA: Insufficient documentation

## 2018-07-12 DIAGNOSIS — Z171 Estrogen receptor negative status [ER-]: Secondary | ICD-10-CM | POA: Insufficient documentation

## 2018-07-12 DIAGNOSIS — Z7951 Long term (current) use of inhaled steroids: Secondary | ICD-10-CM | POA: Insufficient documentation

## 2018-07-12 DIAGNOSIS — K219 Gastro-esophageal reflux disease without esophagitis: Secondary | ICD-10-CM | POA: Insufficient documentation

## 2018-07-12 DIAGNOSIS — C773 Secondary and unspecified malignant neoplasm of axilla and upper limb lymph nodes: Secondary | ICD-10-CM | POA: Insufficient documentation

## 2018-07-12 DIAGNOSIS — Z888 Allergy status to other drugs, medicaments and biological substances status: Secondary | ICD-10-CM | POA: Insufficient documentation

## 2018-07-12 DIAGNOSIS — C50912 Malignant neoplasm of unspecified site of left female breast: Secondary | ICD-10-CM | POA: Insufficient documentation

## 2018-07-12 DIAGNOSIS — C50312 Malignant neoplasm of lower-inner quadrant of left female breast: Secondary | ICD-10-CM

## 2018-07-12 DIAGNOSIS — Z803 Family history of malignant neoplasm of breast: Secondary | ICD-10-CM | POA: Insufficient documentation

## 2018-07-12 DIAGNOSIS — Z87891 Personal history of nicotine dependence: Secondary | ICD-10-CM | POA: Insufficient documentation

## 2018-07-12 DIAGNOSIS — Z1159 Encounter for screening for other viral diseases: Secondary | ICD-10-CM | POA: Insufficient documentation

## 2018-07-12 DIAGNOSIS — F419 Anxiety disorder, unspecified: Secondary | ICD-10-CM | POA: Insufficient documentation

## 2018-07-12 HISTORY — PX: BREAST BIOPSY WITH SENTINEL LYMPH NODE BIOPSY AND NEEDLE LOCALIZATION: SHX6326

## 2018-07-12 HISTORY — PX: AXILLARY LYMPH NODE DISSECTION: SHX5229

## 2018-07-12 SURGERY — BREAST BIOPSY WITH SENTINEL LYMPH NODE BIOPSY AND NEEDLE LOCALIZATION
Anesthesia: General | Laterality: Left

## 2018-07-12 MED ORDER — PROPOFOL 10 MG/ML IV BOLUS
INTRAVENOUS | Status: DC | PRN
  Administered 2018-07-12: 30 mg via INTRAVENOUS
  Administered 2018-07-12: 60 mg via INTRAVENOUS
  Administered 2018-07-12: 130 mg via INTRAVENOUS

## 2018-07-12 MED ORDER — BUPIVACAINE-EPINEPHRINE (PF) 0.5% -1:200000 IJ SOLN
INTRAMUSCULAR | Status: AC
  Filled 2018-07-12: qty 30

## 2018-07-12 MED ORDER — LIDOCAINE HCL (PF) 2 % IJ SOLN
INTRAMUSCULAR | Status: AC
  Filled 2018-07-12: qty 10

## 2018-07-12 MED ORDER — GABAPENTIN 300 MG PO CAPS
300.0000 mg | ORAL_CAPSULE | ORAL | Status: AC
  Administered 2018-07-12: 300 mg via ORAL

## 2018-07-12 MED ORDER — FENTANYL CITRATE (PF) 100 MCG/2ML IJ SOLN
INTRAMUSCULAR | Status: DC | PRN
  Administered 2018-07-12: 25 ug via INTRAVENOUS
  Administered 2018-07-12: 50 ug via INTRAVENOUS
  Administered 2018-07-12 (×5): 25 ug via INTRAVENOUS

## 2018-07-12 MED ORDER — CEFAZOLIN SODIUM-DEXTROSE 2-4 GM/100ML-% IV SOLN
INTRAVENOUS | Status: AC
  Filled 2018-07-12: qty 100

## 2018-07-12 MED ORDER — KETOROLAC TROMETHAMINE 30 MG/ML IJ SOLN
INTRAMUSCULAR | Status: AC
  Filled 2018-07-12: qty 1

## 2018-07-12 MED ORDER — ONDANSETRON HCL 4 MG/2ML IJ SOLN
INTRAMUSCULAR | Status: AC
  Filled 2018-07-12: qty 2

## 2018-07-12 MED ORDER — FENTANYL CITRATE (PF) 100 MCG/2ML IJ SOLN
INTRAMUSCULAR | Status: AC
  Filled 2018-07-12: qty 2

## 2018-07-12 MED ORDER — OXYCODONE-ACETAMINOPHEN 5-325 MG PO TABS
1.0000 | ORAL_TABLET | Freq: Once | ORAL | Status: AC
  Administered 2018-07-12: 1 via ORAL

## 2018-07-12 MED ORDER — SODIUM CHLORIDE FLUSH 0.9 % IV SOLN
INTRAVENOUS | Status: AC
  Filled 2018-07-12: qty 10

## 2018-07-12 MED ORDER — DEXAMETHASONE SODIUM PHOSPHATE 10 MG/ML IJ SOLN
INTRAMUSCULAR | Status: DC | PRN
  Administered 2018-07-12: 5 mg via INTRAVENOUS

## 2018-07-12 MED ORDER — HEPARIN SOD (PORK) LOCK FLUSH 100 UNIT/ML IV SOLN
INTRAVENOUS | Status: AC
  Filled 2018-07-12: qty 5

## 2018-07-12 MED ORDER — TECHNETIUM TC 99M SULFUR COLLOID FILTERED
1.0000 | Freq: Once | INTRAVENOUS | Status: AC | PRN
  Administered 2018-07-12: 0.805 via INTRADERMAL

## 2018-07-12 MED ORDER — PHENYLEPHRINE HCL (PRESSORS) 10 MG/ML IV SOLN
INTRAVENOUS | Status: DC | PRN
  Administered 2018-07-12 (×9): 100 ug via INTRAVENOUS

## 2018-07-12 MED ORDER — PROPOFOL 500 MG/50ML IV EMUL
INTRAVENOUS | Status: AC
  Filled 2018-07-12: qty 50

## 2018-07-12 MED ORDER — ACETAMINOPHEN 10 MG/ML IV SOLN
INTRAVENOUS | Status: DC | PRN
  Administered 2018-07-12: 1000 mg via INTRAVENOUS

## 2018-07-12 MED ORDER — ONDANSETRON HCL 4 MG/2ML IJ SOLN
INTRAMUSCULAR | Status: DC | PRN
  Administered 2018-07-12: 4 mg via INTRAVENOUS

## 2018-07-12 MED ORDER — KETOROLAC TROMETHAMINE 30 MG/ML IJ SOLN
INTRAMUSCULAR | Status: DC | PRN
  Administered 2018-07-12: 15 mg via INTRAVENOUS

## 2018-07-12 MED ORDER — ACETAMINOPHEN 10 MG/ML IV SOLN
INTRAVENOUS | Status: AC
  Filled 2018-07-12: qty 100

## 2018-07-12 MED ORDER — PROPOFOL 500 MG/50ML IV EMUL
INTRAVENOUS | Status: DC | PRN
  Administered 2018-07-12: 50 ug/kg/min via INTRAVENOUS
  Administered 2018-07-12 (×2): via INTRAVENOUS

## 2018-07-12 MED ORDER — BUPIVACAINE-EPINEPHRINE (PF) 0.5% -1:200000 IJ SOLN
INTRAMUSCULAR | Status: DC | PRN
  Administered 2018-07-12: 10 mL
  Administered 2018-07-12: 17 mL

## 2018-07-12 MED ORDER — FENTANYL CITRATE (PF) 100 MCG/2ML IJ SOLN
25.0000 ug | INTRAMUSCULAR | Status: DC | PRN
  Administered 2018-07-12 (×2): 50 ug via INTRAVENOUS
  Administered 2018-07-12 (×2): 25 ug via INTRAVENOUS

## 2018-07-12 MED ORDER — LACTATED RINGERS IV SOLN
INTRAVENOUS | Status: DC
  Administered 2018-07-12: 11:00:00 via INTRAVENOUS

## 2018-07-12 MED ORDER — PROPOFOL 10 MG/ML IV BOLUS
INTRAVENOUS | Status: AC
  Filled 2018-07-12: qty 20

## 2018-07-12 MED ORDER — PROMETHAZINE HCL 25 MG/ML IJ SOLN: 6.2500 mg | INTRAMUSCULAR | Status: DC | PRN

## 2018-07-12 MED ORDER — LIDOCAINE HCL (CARDIAC) PF 100 MG/5ML IV SOSY
PREFILLED_SYRINGE | INTRAVENOUS | Status: DC | PRN
  Administered 2018-07-12: 60 mg via INTRATRACHEAL

## 2018-07-12 MED ORDER — OXYCODONE-ACETAMINOPHEN 5-325 MG PO TABS
ORAL_TABLET | ORAL | Status: AC
  Filled 2018-07-12: qty 1

## 2018-07-12 MED ORDER — MIDAZOLAM HCL 2 MG/2ML IJ SOLN
INTRAMUSCULAR | Status: DC | PRN
  Administered 2018-07-12: 2 mg via INTRAVENOUS

## 2018-07-12 MED ORDER — HYDROCODONE-ACETAMINOPHEN 5-325 MG PO TABS: 1.0000 | ORAL_TABLET | ORAL | 0 refills | Status: DC | PRN

## 2018-07-12 MED ORDER — METHYLENE BLUE 0.5 % INJ SOLN
INTRAVENOUS | Status: DC | PRN
  Administered 2018-07-12: 3 mL

## 2018-07-12 MED ORDER — METHYLENE BLUE 0.5 % INJ SOLN
INTRAVENOUS | Status: AC
  Filled 2018-07-12: qty 10

## 2018-07-12 MED ORDER — GABAPENTIN 300 MG PO CAPS
ORAL_CAPSULE | ORAL | Status: AC
  Filled 2018-07-12: qty 1

## 2018-07-12 MED ORDER — MIDAZOLAM HCL 2 MG/2ML IJ SOLN
INTRAMUSCULAR | Status: AC
  Filled 2018-07-12: qty 2

## 2018-07-12 MED ORDER — EPHEDRINE SULFATE 50 MG/ML IJ SOLN
INTRAMUSCULAR | Status: AC
  Filled 2018-07-12: qty 1

## 2018-07-12 MED ORDER — OXYCODONE HCL 5 MG PO CAPS: 5.0000 mg | ORAL_CAPSULE | ORAL | 0 refills | Status: DC | PRN

## 2018-07-12 MED ORDER — EPHEDRINE SULFATE 50 MG/ML IJ SOLN
INTRAMUSCULAR | Status: DC | PRN
  Administered 2018-07-12: 5 mg via INTRAVENOUS
  Administered 2018-07-12: 10 mg via INTRAVENOUS

## 2018-07-12 SURGICAL SUPPLY — 57 items
BINDER BREAST LRG (GAUZE/BANDAGES/DRESSINGS) IMPLANT
BINDER BREAST MEDIUM (GAUZE/BANDAGES/DRESSINGS) ×3 IMPLANT
BINDER BREAST XLRG (GAUZE/BANDAGES/DRESSINGS) IMPLANT
BINDER BREAST XXLRG (GAUZE/BANDAGES/DRESSINGS) IMPLANT
BLADE SURG 15 STRL SS SAFETY (BLADE) ×6 IMPLANT
BULB RESERV EVAC DRAIN JP 100C (MISCELLANEOUS) ×3 IMPLANT
CANISTER SUCT 1200ML W/VALVE (MISCELLANEOUS) ×3 IMPLANT
CHLORAPREP W/TINT 26 (MISCELLANEOUS) ×3 IMPLANT
CLOSURE WOUND 1/2 X4 (GAUZE/BANDAGES/DRESSINGS) ×1
CNTNR SPEC 2.5X3XGRAD LEK (MISCELLANEOUS) ×1
CONT SPEC 4OZ STER OR WHT (MISCELLANEOUS) ×2
CONTAINER SPEC 2.5X3XGRAD LEK (MISCELLANEOUS) ×1 IMPLANT
COVER PROBE FLX POLY STRL (MISCELLANEOUS) ×6 IMPLANT
COVER WAND RF STERILE (DRAPES) IMPLANT
DEVICE DUBIN SPECIMEN MAMMOGRA (MISCELLANEOUS) ×6 IMPLANT
DRAIN CHANNEL JP 15F RND 16 (MISCELLANEOUS) ×3 IMPLANT
DRAPE LAPAROTOMY TRNSV 106X77 (MISCELLANEOUS) ×3 IMPLANT
DRSG GAUZE FLUFF 36X18 (GAUZE/BANDAGES/DRESSINGS) ×6 IMPLANT
DRSG TELFA 3X8 NADH (GAUZE/BANDAGES/DRESSINGS) ×3 IMPLANT
ELECT CAUTERY BLADE TIP 2.5 (TIP) ×3
ELECT REM PT RETURN 9FT ADLT (ELECTROSURGICAL) ×3
ELECTRODE CAUTERY BLDE TIP 2.5 (TIP) ×1 IMPLANT
ELECTRODE REM PT RTRN 9FT ADLT (ELECTROSURGICAL) ×1 IMPLANT
GAUZE SPONGE 4X4 12PLY STRL (GAUZE/BANDAGES/DRESSINGS) ×3 IMPLANT
GLOVE BIO SURGEON STRL SZ7.5 (GLOVE) ×3 IMPLANT
GLOVE INDICATOR 8.0 STRL GRN (GLOVE) ×3 IMPLANT
GOWN STRL REUS W/ TWL LRG LVL3 (GOWN DISPOSABLE) ×3 IMPLANT
GOWN STRL REUS W/TWL LRG LVL3 (GOWN DISPOSABLE) ×6
KIT TURNOVER KIT A (KITS) ×3 IMPLANT
LABEL OR SOLS (LABEL) ×3 IMPLANT
MARGIN MAP 10MM (MISCELLANEOUS) ×3 IMPLANT
NEEDLE HYPO 22GX1.5 SAFETY (NEEDLE) ×3 IMPLANT
NEEDLE HYPO 25X1 1.5 SAFETY (NEEDLE) ×6 IMPLANT
PACK BASIN MINOR ARMC (MISCELLANEOUS) ×3 IMPLANT
RETRACTOR RING XSMALL (MISCELLANEOUS) ×1 IMPLANT
RTRCTR WOUND ALEXIS 13CM XS SH (MISCELLANEOUS) ×3
SHEARS FOC LG CVD HARMONIC 17C (MISCELLANEOUS) ×3 IMPLANT
SHEARS HARMONIC 9CM CVD (BLADE) ×3 IMPLANT
SLEVE PROBE SENORX GAMMA FIND (MISCELLANEOUS) IMPLANT
STRIP CLOSURE SKIN 1/2X4 (GAUZE/BANDAGES/DRESSINGS) ×2 IMPLANT
SUT ETHILON 3-0 FS-10 30 BLK (SUTURE) ×3
SUT SILK 2 0 (SUTURE) ×2
SUT SILK 2-0 18XBRD TIE 12 (SUTURE) ×1 IMPLANT
SUT VIC AB 0 CT1 36 (SUTURE) ×3 IMPLANT
SUT VIC AB 2-0 CT1 (SUTURE) ×6 IMPLANT
SUT VIC AB 2-0 CT1 27 (SUTURE) ×6
SUT VIC AB 2-0 CT1 TAPERPNT 27 (SUTURE) ×3 IMPLANT
SUT VIC AB 3-0 SH 27 (SUTURE) ×4
SUT VIC AB 3-0 SH 27X BRD (SUTURE) ×2 IMPLANT
SUT VIC AB 4-0 FS2 27 (SUTURE) ×6 IMPLANT
SUT VICRYL+ 3-0 144IN (SUTURE) ×3 IMPLANT
SUTURE EHLN 3-0 FS-10 30 BLK (SUTURE) ×1 IMPLANT
SWABSTK COMLB BENZOIN TINCTURE (MISCELLANEOUS) ×3 IMPLANT
SYR 10ML LL (SYRINGE) ×3 IMPLANT
SYR BULB IRRIG 60ML STRL (SYRINGE) ×3 IMPLANT
TAPE TRANSPORE STRL 2 31045 (GAUZE/BANDAGES/DRESSINGS) ×3 IMPLANT
WATER STERILE IRR 1000ML POUR (IV SOLUTION) ×3 IMPLANT

## 2018-07-12 NOTE — Transfer of Care (Signed)
Immediate Anesthesia Transfer of Care Note  [name]  Procedure(s) Performed: BREAST BIOPSY WIDE EXCISION WITH SENTINEL NODE  AND NEEDLE LOCALIZATION OF OXILLARY NODS LEFT (Left ) AXILLARY LYMPH NODE DISSECTION (Left )  Patient Location: PACU  Anesthesia Type:General  Level of Consciousness: awake, alert  and oriented  Airway & Oxygen Therapy: Patient Spontanous Breathing and Patient connected to face mask oxygen  Post-op Assessment: Report given to RN, Post -op Vital signs reviewed and stable and Patient moving all extremities X 4  Post vital signs: Reviewed and stable  Last Vitals:  Vitals Value Taken Time  BP 107/81 07/12/18 1331  Temp    Pulse 111 07/12/18 1331  Resp    SpO2      Last Pain:  Vitals:   07/12/18 1000  TempSrc: Temporal  PainSc: 0-No pain         Complications: No apparent anesthesia complications

## 2018-07-12 NOTE — Anesthesia Preprocedure Evaluation (Addendum)
Anesthesia Evaluation  Patient identified by MRN, date of birth, ID band Patient awake    Reviewed: Allergy & Precautions, H&P , NPO status , Patient's Chart, lab work & pertinent test results, reviewed documented beta blocker date and time   History of Anesthesia Complications (+) PONV, Family history of anesthesia reaction and history of anesthetic complications  Airway Mallampati: II  TM Distance: >3 FB Neck ROM: full    Dental  (+) Caps, Dental Advidsory Given   Pulmonary neg shortness of breath, asthma , neg COPD, neg recent URI, former smoker,           Cardiovascular Exercise Tolerance: Good hypertension, (-) angina(-) CAD, (-) Past MI, (-) Cardiac Stents and (-) CABG (-) dysrhythmias + Valvular Problems/Murmurs MVP      Neuro/Psych PSYCHIATRIC DISORDERS Anxiety Depression negative neurological ROS     GI/Hepatic Neg liver ROS, GERD  ,  Endo/Other  negative endocrine ROS  Renal/GU negative Renal ROS  negative genitourinary   Musculoskeletal   Abdominal   Peds  Hematology negative hematology ROS (+)   Anesthesia Other Findings Past Medical History: No date: Anemia     Comment:  h/o with pregnancy No date: Anxiety No date: Asthma     Comment:  allergy induced-no inhaler No date: Complication of anesthesia No date: Environmental allergies No date: Family history of adverse reaction to anesthesia     Comment:  son-breathing problems-coded 1st time when he was 1 and               had another surgery at 74 and had to be admitted for               breathing problems No date: Family history of breast cancer No date: GERD (gastroesophageal reflux disease) No date: Headache     Comment:  migraines No date: Hypertension No date: Mitral valve disease No date: Mitral valve prolapse No date: Painful menstrual periods No date: PONV (postoperative nausea and vomiting) No date: Vertigo    Reproductive/Obstetrics negative OB ROS                             Anesthesia Physical  Anesthesia Plan  ASA: II  Anesthesia Plan: General   Post-op Pain Management:    Induction: Intravenous  PONV Risk Score and Plan: 3 and Ondansetron, Dexamethasone, Treatment may vary due to age or medical condition, Propofol infusion and TIVA  Airway Management Planned: LMA  Additional Equipment:   Intra-op Plan:   Post-operative Plan:   Informed Consent: I have reviewed the patients History and Physical, chart, labs and discussed the procedure including the risks, benefits and alternatives for the proposed anesthesia with the patient or authorized representative who has indicated his/her understanding and acceptance.     Dental Advisory Given  Plan Discussed with: Anesthesiologist, CRNA and Surgeon  Anesthesia Plan Comments:        Anesthesia Quick Evaluation

## 2018-07-12 NOTE — Discharge Instructions (Signed)
AMBULATORY SURGERY  DISCHARGE INSTRUCTIONS   1) The drugs that you were given will stay in your system until tomorrow so for the next 24 hours you should not:  A) Drive an automobile B) Make any legal decisions C) Drink any alcoholic beverage   2) You may resume regular meals tomorrow.  Today it is better to start with liquids and gradually work up to solid foods.  You may eat anything you prefer, but it is better to start with liquids, then soup and crackers, and gradually work up to solid foods.   3) Please notify your doctor immediately if you have any unusual bleeding, trouble breathing, redness and pain at the surgery site, drainage, fever, or pain not relieved by medication.    4) Additional Instructions:  Do not take hydrocodone(norco).  Oxycodone has been provided instead.       Please contact your physician with any problems or Same Day Surgery at [phone], Monday through Friday 6 am to 4 pm, or [place] at [facility] number at [phone].

## 2018-07-12 NOTE — Anesthesia Procedure Notes (Addendum)
Procedure Name: LMA Insertion Date/Time: 07/12/2018 10:42 AM Performed by: Caryl Asp, CRNA Pre-anesthesia Checklist: Patient identified, Emergency Drugs available, Suction available, Patient being monitored and Timeout performed Patient Re-evaluated:Patient Re-evaluated prior to induction Oxygen Delivery Method: Circle system utilized Preoxygenation: Pre-oxygenation with 100% oxygen Induction Type: IV induction Ventilation: Mask ventilation without difficulty LMA: LMA inserted LMA Size: 3.0

## 2018-07-12 NOTE — H&P (Signed)
Shelby [name] [id number] 03-04-1970     HPI:  Healthy 48 y/o s/p neo-adjuvant chemotherapy for wide excision/ SLN biopsy.   Medications Prior to Admission  Medication Sig Dispense Refill Last Dose  . ALPRAZolam (XANAX) 0.5 MG tablet Take 0.5-1 tablets (0.25-0.5 mg total) by mouth 2 (two) times daily as needed for anxiety. (Patient taking differently: Take 0.25 mg by mouth at bedtime. ) 60 tablet 0 07/12/2018 at Unknown time  . Cholecalciferol (VITAMIN D) 50 MCG (2000 UT) CAPS Take 2,000 Units by mouth daily.   07/11/2018 at Unknown time  . EPINEPHrine 0.3 mg/0.3 mL IJ SOAJ injection Inject 0.3 mLs (0.3 mg total) into the muscle once. (Patient taking differently: Inject 0.3 mg into the muscle daily as needed for anaphylaxis. ) 1 Device 12   . famotidine (PEPCID) 20 MG tablet Take 20 mg by mouth daily.   0 07/12/2018 at Unknown time  . ibuprofen (ADVIL,MOTRIN) 200 MG tablet Take 200 mg by mouth daily as needed for mild pain.    Past Week at Unknown time  . lidocaine-prilocaine (EMLA) cream Apply to affected area once (Patient taking differently: Apply 1 application topically daily as needed (Prior to porta-cath access). ) 30 g 3   . losartan (COZAAR) 50 MG tablet TAKE 1 TABLET BY MOUTH DAILY (Patient taking differently: Take 50 mg by mouth at bedtime. ) 90 tablet 0 07/11/2018 at Unknown time  . mometasone (NASONEX) 50 MCG/ACT nasal spray Place 2 sprays into the nose daily. (Patient taking differently: Place 2 sprays into the nose daily as needed (allergies). ) 17 g 12 Past Week at Unknown time  . montelukast (SINGULAIR) 10 MG tablet TAKE 1 TABLET BY MOUTH AT BEDTIME (Patient taking differently: Take 10 mg by mouth daily as needed (allergies). ) 30 tablet 12 Past Week at Unknown time  . ondansetron (ZOFRAN) 8 MG tablet Take 1 tablet (8 mg total) by mouth 2 (two) times daily as needed. 30 tablet 2 Past Month at Unknown time  . prochlorperazine (COMPAZINE) 10 MG tablet Take 1 tablet (10 mg total) by  mouth every 6 (six) hours as needed (Nausea or vomiting). 60 tablet 2 Past Month at Unknown time   Allergies  Allergen Reactions  . Hydrocodone Hives    Swollen face - many years ago. Thinks she has tolerated since.  . Iodine Hives  . Peanut Butter Flavor Other (See Comments)    Made mouth tingle  . Shellfish Allergy Hives   Past Medical History:  Diagnosis Date  . Anemia    h/o with pregnancy  . Anxiety   . Asthma    allergy induced-no inhaler  . Cancer [facility])    breast left   . Complication of anesthesia   . Environmental allergies   . Family history of adverse reaction to anesthesia    son-breathing problems-coded 1st time when he was 2 and had another surgery at 59 and had to be admitted for breathing problems  . Family history of breast cancer   . GERD (gastroesophageal reflux disease)   . Headache    migraines  . Hypertension   . Mitral valve disease   . Mitral valve prolapse   . Painful menstrual periods   . PONV (postoperative nausea and vomiting)   . Vertigo    Past Surgical History:  Procedure Laterality Date  . ABDOMINAL HYSTERECTOMY  2010   supracervical   . BUNIONECTOMY Bilateral   . MANDIBLE RECONSTRUCTION     age 79-underbite  .  PORTACATH PLACEMENT Right 12/31/2017   Procedure: INSERTION PORT-A-CATH;  Surgeon: Robert Bellow, MD;  Location: [facility] ORS;  Service: General;  Laterality: Right;  . SHOULDER ARTHROSCOPY WITH OPEN ROTATOR CUFF REPAIR Right 04/26/2017   Procedure: SHOULDER ARTHROSCOPY WITH OPEN ROTATOR CUFF REPAIR,SUBACROMINAL DECOMPRESSION;  Surgeon: Thornton Park, MD;  Location: [facility] ORS;  Service: Orthopedics;  Laterality: Right;  . TONSILLECTOMY     Social History   Socioeconomic History  . Marital status: Divorced    Spouse name: Not on file  . Number of children: Not on file  . Years of education: Not on file  . Highest education level: Not on file  Occupational History  . Not on file  Social Needs  . Financial resource  strain: Not on file  . Food insecurity    Worry: Not on file    Inability: Not on file  . Transportation needs    Medical: Not on file    Non-medical: Not on file  Tobacco Use  . Smoking status: Former Smoker    Packs/day: 0.50    Years: 10.00    Pack years: 5.00    Types: Cigarettes    Quit date: 04/20/2007    Years since quitting: 11.2  . Smokeless tobacco: Never Used  . Tobacco comment: social smoker back then  Substance and Sexual Activity  . Alcohol use: Yes    Comment: occas  . Drug use: No  . Sexual activity: Yes  Lifestyle  . Physical activity    Days per week: Not on file    Minutes per session: Not on file  . Stress: Not on file  Relationships  . Social Herbalist on phone: Not on file    Gets together: Not on file    Attends religious service: Not on file    Active member of club or organization: Not on file    Attends meetings of clubs or organizations: Not on file    Relationship status: Not on file  . Intimate partner violence    Fear of current or ex partner: Not on file    Emotionally abused: Not on file    Physically abused: Not on file    Forced sexual activity: Not on file  Other Topics Concern  . Not on file  Social History Narrative  . Not on file   Social History   Social History Narrative  . Not on file     ROS: Negative.     PE: HEENT: Negative. Lungs: Clear. Cardio: RRForest Gleason Jaxsyn [name] 07/12/2018   Assessment/Plan:  Proceed with planned endoscopy. Shelby [name] [id number] 10-02-1970     HPI:  Medications Prior to Admission  Medication Sig Dispense Refill Last Dose  . ALPRAZolam (XANAX) 0.5 MG tablet Take 0.5-1 tablets (0.25-0.5 mg total) by mouth 2 (two) times daily as needed for anxiety. (Patient taking differently: Take 0.25 mg by mouth at bedtime. ) 60 tablet 0 07/12/2018 at Unknown time  . Cholecalciferol (VITAMIN D) 50 MCG (2000 UT) CAPS Take 2,000 Units by mouth daily.   07/11/2018 at Unknown time   . EPINEPHrine 0.3 mg/0.3 mL IJ SOAJ injection Inject 0.3 mLs (0.3 mg total) into the muscle once. (Patient taking differently: Inject 0.3 mg into the muscle daily as needed for anaphylaxis. ) 1 Device 12   . famotidine (PEPCID) 20 MG tablet Take 20 mg by mouth daily.   0 07/12/2018 at Unknown time  . ibuprofen (ADVIL,MOTRIN) 200 MG tablet Take 200  mg by mouth daily as needed for mild pain.    Past Week at Unknown time  . lidocaine-prilocaine (EMLA) cream Apply to affected area once (Patient taking differently: Apply 1 application topically daily as needed (Prior to porta-cath access). ) 30 g 3   . losartan (COZAAR) 50 MG tablet TAKE 1 TABLET BY MOUTH DAILY (Patient taking differently: Take 50 mg by mouth at bedtime. ) 90 tablet 0 07/11/2018 at Unknown time  . mometasone (NASONEX) 50 MCG/ACT nasal spray Place 2 sprays into the nose daily. (Patient taking differently: Place 2 sprays into the nose daily as needed (allergies). ) 17 g 12 Past Week at Unknown time  . montelukast (SINGULAIR) 10 MG tablet TAKE 1 TABLET BY MOUTH AT BEDTIME (Patient taking differently: Take 10 mg by mouth daily as needed (allergies). ) 30 tablet 12 Past Week at Unknown time  . ondansetron (ZOFRAN) 8 MG tablet Take 1 tablet (8 mg total) by mouth 2 (two) times daily as needed. 30 tablet 2 Past Month at Unknown time  . prochlorperazine (COMPAZINE) 10 MG tablet Take 1 tablet (10 mg total) by mouth every 6 (six) hours as needed (Nausea or vomiting). 60 tablet 2 Past Month at Unknown time   Allergies  Allergen Reactions  . Hydrocodone Hives    Swollen face - many years ago. Thinks she has tolerated since.  . Iodine Hives  . Peanut Butter Flavor Other (See Comments)    Made mouth tingle  . Shellfish Allergy Hives   Past Medical History:  Diagnosis Date  . Anemia    h/o with pregnancy  . Anxiety   . Asthma    allergy induced-no inhaler  . Cancer [facility])    breast left   . Complication of anesthesia   . Environmental  allergies   . Family history of adverse reaction to anesthesia    son-breathing problems-coded 1st time when he was 2 and had another surgery at 52 and had to be admitted for breathing problems  . Family history of breast cancer   . GERD (gastroesophageal reflux disease)   . Headache    migraines  . Hypertension   . Mitral valve disease   . Mitral valve prolapse   . Painful menstrual periods   . PONV (postoperative nausea and vomiting)   . Vertigo    Past Surgical History:  Procedure Laterality Date  . ABDOMINAL HYSTERECTOMY  2010   supracervical   . BUNIONECTOMY Bilateral   . MANDIBLE RECONSTRUCTION     age 74-underbite  . PORTACATH PLACEMENT Right 12/31/2017   Procedure: INSERTION PORT-A-CATH;  Surgeon: Robert Bellow, MD;  Location: [facility] ORS;  Service: General;  Laterality: Right;  . SHOULDER ARTHROSCOPY WITH OPEN ROTATOR CUFF REPAIR Right 04/26/2017   Procedure: SHOULDER ARTHROSCOPY WITH OPEN ROTATOR CUFF REPAIR,SUBACROMINAL DECOMPRESSION;  Surgeon: Thornton Park, MD;  Location: [facility] ORS;  Service: Orthopedics;  Laterality: Right;  . TONSILLECTOMY     Social History   Socioeconomic History  . Marital status: Divorced    Spouse name: Not on file  . Number of children: Not on file  . Years of education: Not on file  . Highest education level: Not on file  Occupational History  . Not on file  Social Needs  . Financial resource strain: Not on file  . Food insecurity    Worry: Not on file    Inability: Not on file  . Transportation needs    Medical: Not on file    Non-medical: Not  on file  Tobacco Use  . Smoking status: Former Smoker    Packs/day: 0.50    Years: 10.00    Pack years: 5.00    Types: Cigarettes    Quit date: 04/20/2007    Years since quitting: 11.2  . Smokeless tobacco: Never Used  . Tobacco comment: social smoker back then  Substance and Sexual Activity  . Alcohol use: Yes    Comment: occas  . Drug use: No  . Sexual activity: Yes   Lifestyle  . Physical activity    Days per week: Not on file    Minutes per session: Not on file  . Stress: Not on file  Relationships  . Social Herbalist on phone: Not on file    Gets together: Not on file    Attends religious service: Not on file    Active member of club or organization: Not on file    Attends meetings of clubs or organizations: Not on file    Relationship status: Not on file  . Intimate partner violence    Fear of current or ex partner: Not on file    Emotionally abused: Not on file    Physically abused: Not on file    Forced sexual activity: Not on file  Other Topics Concern  . Not on file  Social History Narrative  . Not on file   Social History   Social History Narrative  . Not on file     ROS: Negative.     PE: HEENT: Negative. Lungs: Clear. Cardio: RR.  Assessment/Plan:  Proceed with left breast wide excision and sentinel node biopsy, possible axillary dissection.  Shelby Gleason [facility] 07/12/2018

## 2018-07-12 NOTE — Anesthesia Post-op Follow-up Note (Signed)
Anesthesia QCDR form completed.        

## 2018-07-12 NOTE — OR Nursing (Signed)
May d/c to home without MD visit postop per Dr Bary Castilla via tele.

## 2018-07-12 NOTE — Op Note (Signed)
Preoperative diagnosis: Triple negative cancer of the left lower inner quadrant breast.  Postoperative diagnosis: Same.  Operative procedure: Left sentinel node biopsy, axillary dissection, wide local excision with tissue transfer closure.  Operating Surgeon: Hervey Ard, MD.  Anesthesia: General by LMA, Marcaine 0.5% with 1-200,000's of epinephrine.  30 cc.  Estimated blood loss: 10 cc.  Clinical note: This 48 year old woman is undergone neoadjuvant chemotherapy for triple negative cancer.  She is brought to the operative this time for planned breast conservation surgery.  The patient had SCD stockings for DVT prevention.  She received Ancef 2 g intravenously.  Preoperative absolute neutrophil count was 1200.  Operative note: The patient underwent general anesthesia without difficulty.  The breast was cleansed with alcohol and 3 cc of 0.5% methylene blue was instilled in the subareolar plexus.  ChloraPrep was then applied to the breast chest and axilla.  Care was taken to protect the localizing wire placed in radiology this morning for the axillary node.  Attention was turned to the axilla.  Ultrasound was used to follow the localizing wire into the node itself.  The area was marked in the lower aspect of the axilla.  The above-mentioned local anesthetic was infiltrated and a transverse skin incision was made.  Skin was incised sharply remaining dissection completed with electrocautery.  A pocket was created for placement of an extra small Alexis wound protector.  The axillary envelope was opened and the wire and node were excised.  Specimen radiograph did not show the previously placed clip but did show the intact wire.  A second lymph node was identified that was this lymph node was "hot".  A second "hot" node was identified.  Frozen section on the first, wire localized nodes was positive for macro metastatic disease.  The second node was negative.  While the node report was pending  attention was turned to the primary site.  Ultrasound was used to outline the boundaries of the lesion.  Due to its very close proximity to the skin it was elected to remove an ellipse of skin.  Local anesthesia was infiltrated about 4 cm away from the wound.  The skin was incised sharply through an ellipse running just about 2 cm from the edge of the areola to below the inframammary fold.  Skin was incised sharply and the remaining dissection completed with electrocautery.  This was extended down and included the pectoralis fascia.  The specimen was orientated, specimen radiograph confirmed the previously placed clip in the center of the specimen and pathology report showed grossly negative margins.  All the breast specimen was being processed attention was turned to completing an axillary dissection.  Making use of a harmonic scalpel the axillary envelope was exposed and the contents of the axilla below the level of the axillary vein were mobilized.  The long thoracic nerve of Bell and thoracodorsal nerve artery and vein bundles were protected.  Both nerves were functioning at the end of the dissection.  Good hemostasis was noted.  A 15 [place] Blake drain was placed into the axilla and anchored into position with a 3-0 nylon stitch.  The axillary envelope was closed with interrupted 2-0 Vicryl sutures.  The adipose layer was closed in a similar fashion.  The skin was closed with a running 4-0 Vicryl subcuticular suture.  The primary site was then approached to allow approximation of the tissue.  The breast was elevated off the underlying pectoralis muscle with the fascia intact for a distance of 5 cm circumferentially.  This was then approximated with interrupted 2-0 Vicryl figure-of-eight sutures.  A second layer in the adipose tissue provided good skin apposition.  The skin was then closed with a running 4-0 Vicryl subcuticular suture.  Benzoin and Steri-Strips followed by Telfa pad, fluff gauze and a  compressive wrap were applied.  The patient tolerated the procedure well and was taken recovery in stable condition.

## 2018-07-12 NOTE — Progress Notes (Signed)
  Patient initially reported she had taken Hydrocodone without ill effect in the past.  RN from [facility] reports that patient has taken Oxycodone without ill effect.  Will send RX for Oxycodone to the pharmacy, discontinue order for hydrocodone.

## 2018-07-14 NOTE — Anesthesia Postprocedure Evaluation (Signed)
Anesthesia Post Note  [name]  Procedure(s) Performed: BREAST BIOPSY WIDE EXCISION WITH SENTINEL NODE  AND NEEDLE LOCALIZATION OF OXILLARY NODS LEFT (Left ) AXILLARY LYMPH NODE DISSECTION (Left )  Patient location during evaluation: PACU Anesthesia Type: General Level of consciousness: awake and alert Pain management: pain level controlled Vital Signs Assessment: post-procedure vital signs reviewed and stable Respiratory status: spontaneous breathing, nonlabored ventilation, respiratory function stable and patient connected to nasal cannula oxygen Cardiovascular status: blood pressure returned to baseline and stable Postop Assessment: no apparent nausea or vomiting Anesthetic complications: no     Last Vitals:  Vitals:   07/12/18 1444 07/12/18 1604  BP: 123/82 116/79  Pulse: (!) 101 91  Resp: 18 18  Temp:    SpO2: 95% 97%    Last Pain:  Vitals:   07/12/18 1604  TempSrc:   PainSc: 4                  Martha Clan

## 2018-07-15 ENCOUNTER — Encounter: Payer: Self-pay | Admitting: General Surgery

## 2018-07-16 ENCOUNTER — Other Ambulatory Visit: Payer: Self-pay

## 2018-07-16 ENCOUNTER — Encounter: Payer: Self-pay | Admitting: General Surgery

## 2018-07-16 ENCOUNTER — Ambulatory Visit (INDEPENDENT_AMBULATORY_CARE_PROVIDER_SITE_OTHER): Payer: No Typology Code available for payment source | Admitting: General Surgery

## 2018-07-16 VITALS — BP 159/111 | HR 112 | Temp 97.7°F | Ht 66.0 in | Wt 148.0 lb

## 2018-07-16 DIAGNOSIS — C50312 Malignant neoplasm of lower-inner quadrant of left female breast: Secondary | ICD-10-CM

## 2018-07-16 NOTE — Progress Notes (Signed)
Patient ID: Shelby [name], female   DOB: 13-Nov-1970, 48 y.o.   MRN: [id number]  Chief Complaint  Patient presents with  . Routine Post Op    HPI Shelby [name] is a 48 y.o. female here today fir her post op left breast wide excision done on 07/12/2018. Patient states she is just a little sore but doing okay. Drain sheet present.  HPI  Past Medical History:  Diagnosis Date  . Anemia    h/o with pregnancy  . Anxiety   . Asthma    allergy induced-no inhaler  . Cancer [facility])    breast left   . Complication of anesthesia   . Environmental allergies   . Family history of adverse reaction to anesthesia    son-breathing problems-coded 1st time when he was 2 and had another surgery at 34 and had to be admitted for breathing problems  . Family history of breast cancer   . GERD (gastroesophageal reflux disease)   . Headache    migraines  . Hypertension   . Mitral valve disease   . Mitral valve prolapse   . Painful menstrual periods   . PONV (postoperative nausea and vomiting)   . Vertigo     Past Surgical History:  Procedure Laterality Date  . ABDOMINAL HYSTERECTOMY  2010   supracervical   . AXILLARY LYMPH NODE DISSECTION Left 07/12/2018   Procedure: AXILLARY LYMPH NODE DISSECTION;  Surgeon: Robert Bellow, MD;  Location: [facility] ORS;  Service: General;  Laterality: Left;  . BREAST BIOPSY Left 11/2017   invasive ductal carcinoma and metastatic LN  . BREAST BIOPSY WITH SENTINEL LYMPH NODE BIOPSY AND NEEDLE LOCALIZATION Left 07/12/2018   Procedure: BREAST BIOPSY WIDE EXCISION WITH SENTINEL NODE  AND NEEDLE LOCALIZATION OF OXILLARY NODS LEFT;  Surgeon: Robert Bellow, MD;  Location: [facility] ORS;  Service: General;  Laterality: Left;  . BUNIONECTOMY Bilateral   . MANDIBLE RECONSTRUCTION     age 77-underbite  . PORTACATH PLACEMENT Right 12/31/2017   Procedure: INSERTION PORT-A-CATH;  Surgeon: Robert Bellow, MD;  Location: [facility] ORS;  Service: General;  Laterality: Right;  .  SHOULDER ARTHROSCOPY WITH OPEN ROTATOR CUFF REPAIR Right 04/26/2017   Procedure: SHOULDER ARTHROSCOPY WITH OPEN ROTATOR CUFF REPAIR,SUBACROMINAL DECOMPRESSION;  Surgeon: Thornton Park, MD;  Location: [facility] ORS;  Service: Orthopedics;  Laterality: Right;  . TONSILLECTOMY      Family History  Problem Relation Age of Onset  . Breast cancer Mother 19  . Diabetes Father   . Cancer Maternal Uncle        colon  . Breast cancer Maternal Grandmother 70    Social History Social History   Tobacco Use  . Smoking status: Former Smoker    Packs/day: 0.50    Years: 10.00    Pack years: 5.00    Types: Cigarettes    Quit date: 04/20/2007    Years since quitting: 11.2  . Smokeless tobacco: Never Used  . Tobacco comment: social smoker back then  Substance Use Topics  . Alcohol use: Yes    Comment: occas  . Drug use: No    Allergies  Allergen Reactions  . Hydrocodone Hives    Swollen face - many years ago. Thinks she has tolerated since.  . Iodine Hives  . Peanut Butter Flavor Other (See Comments)    Made mouth tingle  . Shellfish Allergy Hives    Current Outpatient Medications  Medication Sig Dispense Refill  . ALPRAZolam (XANAX) 0.5 MG tablet Take  0.5-1 tablets (0.25-0.5 mg total) by mouth 2 (two) times daily as needed for anxiety. (Patient taking differently: Take 0.25 mg by mouth at bedtime. ) 60 tablet 0  . Cholecalciferol (VITAMIN D) 50 MCG (2000 UT) CAPS Take 2,000 Units by mouth daily.    Marland Kitchen EPINEPHrine 0.3 mg/0.3 mL IJ SOAJ injection Inject 0.3 mLs (0.3 mg total) into the muscle once. (Patient taking differently: Inject 0.3 mg into the muscle daily as needed for anaphylaxis. ) 1 Device 12  . famotidine (PEPCID) 20 MG tablet Take 20 mg by mouth daily.   0  . ibuprofen (ADVIL,MOTRIN) 200 MG tablet Take 200 mg by mouth daily as needed for mild pain.     Marland Kitchen lidocaine-prilocaine (EMLA) cream Apply to affected area once (Patient taking differently: Apply 1 application topically daily  as needed (Prior to porta-cath access). ) 30 g 3  . losartan (COZAAR) 50 MG tablet TAKE 1 TABLET BY MOUTH DAILY (Patient taking differently: Take 50 mg by mouth at bedtime. ) 90 tablet 0  . mometasone (NASONEX) 50 MCG/ACT nasal spray Place 2 sprays into the nose daily. (Patient taking differently: Place 2 sprays into the nose daily as needed (allergies). ) 17 g 12  . montelukast (SINGULAIR) 10 MG tablet TAKE 1 TABLET BY MOUTH AT BEDTIME (Patient taking differently: Take 10 mg by mouth daily as needed (allergies). ) 30 tablet 12  . ondansetron (ZOFRAN) 8 MG tablet Take 1 tablet (8 mg total) by mouth 2 (two) times daily as needed. 30 tablet 2  . oxycodone (OXY-IR) 5 MG capsule Take 1 capsule (5 mg total) by mouth every 4 (four) hours as needed. 15 capsule 0  . prochlorperazine (COMPAZINE) 10 MG tablet Take 1 tablet (10 mg total) by mouth every 6 (six) hours as needed (Nausea or vomiting). 60 tablet 2  . HYDROcodone-acetaminophen (NORCO/VICODIN) 5-325 MG tablet Take 1 tablet by mouth every 4 (four) hours as needed for moderate pain. 20 tablet 0   No current facility-administered medications for this visit.     Review of Systems Review of Systems  Constitutional: Negative.   Respiratory: Negative.   Cardiovascular: Negative.   Genitourinary: Positive for urgency.    Blood pressure (!) 159/111, pulse (!) 112, temperature 97.7 F (36.5 C), temperature source Skin, height 5\' 6"  (1.676 m), weight 148 lb (67.1 kg), SpO2 98 %.  Physical Exam Physical Exam Exam conducted with a chaperone present.  Constitutional:      Appearance: Normal appearance.  Chest:    Skin:    General: Skin is warm and dry.  Neurological:     General: No focal deficit present.     Mental Status: She is alert and oriented to person, place, and time.     Data Reviewed DIAGNOSIS:  A. SENTINEL LYMPH NODE #1; EXCISION:  - METASTATIC CARCINOMA INVOLVING ONE LYMPH NODE (1/1).   B. SENTINEL LYMPH NODE #2; EXCISION:   - ONE LYMPH NODE NEGATIVE FOR MALIGNANCY (0/1).   C. BREAST, LEFT LOWER INNER QUADRANT; WIDE EXCISION:  - INVASIVE MAMMARY CARCINOMA WITH CHANGES CONSISTENT WITH NEOADJUVANT  CHEMOTHERAPY.  - DUCTAL CARCINOMA IN SITU.  - SEE CANCER SUMMARY BELOW.  - BIOPSY SITE CHANGE WITH METALLIC MARKER.  - UNREMARKABLE SKIN.   D. AXILLARY TISSUE, LEFT AXILLA; EXCISION:  - BENIGN FIBROADIPOSE AND MAMMARY TISSUE.  - LYMPH NODE TISSUE IS NOT IDENTIFIED.  - DEEPER SECTIONS EXAMINED.   E. LEFT AXILLARY CONTENTS; REGIONAL RESECTION:  - MICROMETASTATIC CARCINOMA INVOLVING ONE OF ELEVEN LYMPH  NODES (1/11).  - DEEPER SECTIONS EXAMINED.   CANCER CASE SUMMARY: INVASIVE CARCINOMA OF THE BREAST  Procedure: Wide excision  Specimen Laterality: Left  Tumor Size: 11 mm  Histologic Type: Invasive carcinoma of no special type  Histologic Grade (Nottingham Histologic Score)    Glandular (Acinar)/Tubular Differentiation: 3       Nuclear Pleomorphism: 3       Mitotic Rate: 3       Overall Grade: 3  Ductal Carcinoma In Situ (DCIS): Present, nuclear grade 3  Negative for extensive intraductal component  Margins:       Invasive Carcinoma Margins: Positive for invasive carcinoma            Medial, unifocal       DCIS Margins: Uninvolved by DCIS            Distance from closest margin: 2.4 mm            Specify closest margin: Lateral  Regional Lymph Nodes: Involved by tumor cells    Number of Lymph Nodes with Macrometastases (>2 mm): 1       Number of Lymph Nodes with Micrometastases (>0.3mm-2mm and/or  >200 cells): 1       Number of Lymph Nodes with Isolated Tumor Cells (=0.2 mm or  =200 cells): 0       Size of Largest Metastatic Deposit: 10 mm       Extranodal Extension: Not identified       Total Number of Lymph Nodes Examined: 13       Number of Sentinel Nodes Examined: 2  Treatment Effect in the  Breast: Treatment Effect in the Breast: Probable  or definite response to presurgical therapy in the invasive carcinoma  Treatment Effect in the Lymph Nodes: Probable or definite response to  presurgical therapy in the metastatic carcinoma  Residual Cancer Burden (RCB) from MD [employer] calculator (website  below):    Primary Tumor Bed       Primary tumor bed: 27 mm x 15 mm       Overall cancer cellularity: 30%       Percentage of cancer that is in situ: 5%    Lymph nodes       Number of lymph nodes positive for metastasis: 2       Diameter of largest metastasis: 10 mm    Residual Cancer Burden: 3.511    Residual Cancer Burden Class: RCB-III  ypT1c ypN1a  Assessment Axillary drain volume trending down, likely removal later in the week. Focal residual disease at the medial margin requiring reexcision.  Plan Pathology became available the day after the patient's visit and she has been contacted by phone with these results.  She is aware of of the recommendation for reexcision of the breast site as well of 2/13 nodes with residual malignancy in the axilla.  Whether she will be a candidate for posttreatment PET scanning with her insurance is unclear, but has been discussed with Dr. Grayland Ormond from medical oncology.  She raised questions of whether additional chemotherapy would be recommended, this is certainly up in the air at present, but she will be able to investigate this in full with Dr. Grayland Ormond at her appointment in the near future, after reexcision to confirm negative margins. FMLA faxed today . One week follow up. Call if drainage stay the same.  .The patient is aware to call back for any questions or concerns.    HPI, Physical Exam, Assessment and Plan have been scribed  under the direction and in the presence of Hervey Ard, MD.  Gaspar Cola, CMA  I have completed the exam and reviewed the above documentation for accuracy and  completeness.  I agree with the above.  Haematologist has been used and any errors in dictation or transcription are unintentional.  Hervey Ard, M.D., F.A.C.S.  Forest Gleason Cardell Rachel 07/17/2018, 1:06 PM

## 2018-07-16 NOTE — Patient Instructions (Addendum)
  FMLA faxed today . One week follow up. Call if drainage stay the same.  .The patient is aware to call back for any questions or concerns.

## 2018-07-17 ENCOUNTER — Other Ambulatory Visit: Payer: Self-pay | Admitting: Pathology

## 2018-07-17 ENCOUNTER — Telehealth: Payer: Self-pay | Admitting: Oncology

## 2018-07-17 LAB — SURGICAL PATHOLOGY

## 2018-07-17 NOTE — Telephone Encounter (Signed)
Do you want me to move her appt back to 7/1 or leave the one scheduled for 6/23 after Byrnett's appt?

## 2018-07-17 NOTE — Telephone Encounter (Signed)
Talked to Dr. Bary Castilla. He is going to call the patient this afternoon.  Keep f/u as scheduled.

## 2018-07-17 NOTE — Telephone Encounter (Signed)
Back to 7/1 would be better. Sorry.

## 2018-07-17 NOTE — Telephone Encounter (Signed)
Pt called requesting follow-up appt with Dr. Grayland Ormond prior to 7/1. I spoke with Dr. Grayland Ormond and he stated he did not have path results yet, but would complete a virtual visit with pt after her appt with Dr. Bary Castilla on 6/23. I call and left pt a VM to call me so we could set-up appt for 6/23.

## 2018-07-18 ENCOUNTER — Other Ambulatory Visit: Payer: Self-pay | Admitting: General Surgery

## 2018-07-18 ENCOUNTER — Ambulatory Visit: Payer: No Typology Code available for payment source | Admitting: *Deleted

## 2018-07-18 ENCOUNTER — Telehealth: Payer: Self-pay | Admitting: *Deleted

## 2018-07-18 ENCOUNTER — Telehealth: Payer: Self-pay

## 2018-07-18 ENCOUNTER — Other Ambulatory Visit: Payer: Self-pay

## 2018-07-18 ENCOUNTER — Encounter: Payer: Self-pay | Admitting: *Deleted

## 2018-07-18 DIAGNOSIS — C50312 Malignant neoplasm of lower-inner quadrant of left female breast: Secondary | ICD-10-CM

## 2018-07-18 NOTE — Patient Instructions (Signed)
The patient is aware to call back for any questions or new concerns.  

## 2018-07-18 NOTE — Telephone Encounter (Signed)
Called patient to let her know that she should come in to see him on 07/31/2018 instead of 07/23/2018. Patient understood and stated that she will come in.

## 2018-07-18 NOTE — Telephone Encounter (Signed)
Patient called and stated that she is now only draining 5cc every 12 hours. She wants to know if she can come in to get the drain removed

## 2018-07-18 NOTE — Progress Notes (Signed)
Patient ID: Shelby [name], female   DOB: 1970/04/08, 48 y.o.   MRN: [id number]   She states her drainage is less than 15 ml/day. Drain removed, gauze and Tegaderm applied. Dr Bary Castilla present discussed surgery next week. May apply heat to left arm as needed.

## 2018-07-18 NOTE — Telephone Encounter (Signed)
Patient states that she has been draining about 5 cc every 12 hours for the past 3 days. Patient to come in today for drain removal

## 2018-07-18 NOTE — Progress Notes (Signed)
Patient's surgery has been scheduled for 07-24-18 at [facility] with Dr. Bary Castilla.  The patient is aware to have COVID-19 testing done on 07-19-18 at the [facility] drive thru ([address] [place]) between 8:00 am and 10:30 am. She is aware to isolate after, have no visitors, wash hands frequently, and avoid touching face.   The patient previously completed a phone interview and will not need to repeat.  The patient is aware to call the office should she have further questions.

## 2018-07-19 ENCOUNTER — Other Ambulatory Visit: Payer: Self-pay

## 2018-07-19 ENCOUNTER — Other Ambulatory Visit
Admission: RE | Admit: 2018-07-19 | Discharge: 2018-07-19 | Disposition: A | Discharge status: Home / Self Care | Payer: No Typology Code available for payment source | Source: Ambulatory Visit | Attending: General Surgery | Admitting: General Surgery

## 2018-07-19 DIAGNOSIS — Z1159 Encounter for screening for other viral diseases: Secondary | ICD-10-CM | POA: Diagnosis present

## 2018-07-20 LAB — NOVEL CORONAVIRUS, NAA (HOSP ORDER, SEND-OUT TO REF LAB; TAT 18-24 HRS): SARS-CoV-2, NAA: NOT DETECTED

## 2018-07-23 ENCOUNTER — Telehealth: Payer: No Typology Code available for payment source | Admitting: Oncology

## 2018-07-23 ENCOUNTER — Encounter: Payer: No Typology Code available for payment source | Admitting: General Surgery

## 2018-07-24 ENCOUNTER — Encounter
Admission: RE | Disposition: A | Discharge status: Home / Self Care | Payer: Self-pay | Source: Home / Self Care | Attending: General Surgery

## 2018-07-24 ENCOUNTER — Encounter: Payer: Self-pay | Admitting: *Deleted

## 2018-07-24 ENCOUNTER — Ambulatory Visit: Payer: No Typology Code available for payment source | Admitting: Anesthesiology

## 2018-07-24 ENCOUNTER — Ambulatory Visit
Admission: RE | Admit: 2018-07-24 | Discharge: 2018-07-24 | Disposition: A | Discharge status: Home / Self Care | Payer: No Typology Code available for payment source | Attending: General Surgery | Admitting: General Surgery

## 2018-07-24 DIAGNOSIS — Z9221 Personal history of antineoplastic chemotherapy: Secondary | ICD-10-CM | POA: Diagnosis not present

## 2018-07-24 DIAGNOSIS — J45909 Unspecified asthma, uncomplicated: Secondary | ICD-10-CM | POA: Diagnosis not present

## 2018-07-24 DIAGNOSIS — Z87891 Personal history of nicotine dependence: Secondary | ICD-10-CM | POA: Diagnosis not present

## 2018-07-24 DIAGNOSIS — F419 Anxiety disorder, unspecified: Secondary | ICD-10-CM | POA: Insufficient documentation

## 2018-07-24 DIAGNOSIS — R42 Dizziness and giddiness: Secondary | ICD-10-CM | POA: Insufficient documentation

## 2018-07-24 DIAGNOSIS — D0512 Intraductal carcinoma in situ of left breast: Secondary | ICD-10-CM | POA: Diagnosis not present

## 2018-07-24 DIAGNOSIS — G43909 Migraine, unspecified, not intractable, without status migrainosus: Secondary | ICD-10-CM | POA: Diagnosis not present

## 2018-07-24 DIAGNOSIS — Z79899 Other long term (current) drug therapy: Secondary | ICD-10-CM | POA: Insufficient documentation

## 2018-07-24 DIAGNOSIS — Z803 Family history of malignant neoplasm of breast: Secondary | ICD-10-CM | POA: Insufficient documentation

## 2018-07-24 DIAGNOSIS — K219 Gastro-esophageal reflux disease without esophagitis: Secondary | ICD-10-CM | POA: Insufficient documentation

## 2018-07-24 DIAGNOSIS — C50312 Malignant neoplasm of lower-inner quadrant of left female breast: Secondary | ICD-10-CM

## 2018-07-24 DIAGNOSIS — Z800 Family history of malignant neoplasm of digestive organs: Secondary | ICD-10-CM | POA: Insufficient documentation

## 2018-07-24 DIAGNOSIS — Z833 Family history of diabetes mellitus: Secondary | ICD-10-CM | POA: Diagnosis not present

## 2018-07-24 DIAGNOSIS — Z91041 Radiographic dye allergy status: Secondary | ICD-10-CM | POA: Diagnosis not present

## 2018-07-24 DIAGNOSIS — Z885 Allergy status to narcotic agent status: Secondary | ICD-10-CM | POA: Diagnosis not present

## 2018-07-24 DIAGNOSIS — Z90710 Acquired absence of both cervix and uterus: Secondary | ICD-10-CM | POA: Insufficient documentation

## 2018-07-24 DIAGNOSIS — Z91010 Allergy to peanuts: Secondary | ICD-10-CM | POA: Insufficient documentation

## 2018-07-24 DIAGNOSIS — Z91013 Allergy to seafood: Secondary | ICD-10-CM | POA: Diagnosis not present

## 2018-07-24 DIAGNOSIS — I341 Nonrheumatic mitral (valve) prolapse: Secondary | ICD-10-CM | POA: Insufficient documentation

## 2018-07-24 DIAGNOSIS — C50912 Malignant neoplasm of unspecified site of left female breast: Secondary | ICD-10-CM | POA: Diagnosis present

## 2018-07-24 HISTORY — PX: RE-EXCISION OF BREAST LUMPECTOMY: SHX6048

## 2018-07-24 SURGERY — EXCISION, LESION, BREAST
Anesthesia: General | Laterality: Left

## 2018-07-24 MED ORDER — ONDANSETRON HCL 4 MG/2ML IJ SOLN
INTRAMUSCULAR | Status: DC | PRN
  Administered 2018-07-24: 4 mg via INTRAVENOUS

## 2018-07-24 MED ORDER — TRAMADOL HCL 50 MG PO TABS: 50.0000 mg | ORAL_TABLET | ORAL | 0 refills | Status: DC | PRN

## 2018-07-24 MED ORDER — ONDANSETRON HCL 4 MG/2ML IJ SOLN
4.0000 mg | Freq: Once | INTRAMUSCULAR | Status: AC | PRN
  Administered 2018-07-24: 4 mg via INTRAVENOUS

## 2018-07-24 MED ORDER — LIDOCAINE HCL (CARDIAC) PF 100 MG/5ML IV SOSY
PREFILLED_SYRINGE | INTRAVENOUS | Status: DC | PRN
  Administered 2018-07-24: 80 mg via INTRAVENOUS

## 2018-07-24 MED ORDER — PHENYLEPHRINE HCL (PRESSORS) 10 MG/ML IV SOLN
INTRAVENOUS | Status: DC | PRN
  Administered 2018-07-24 (×2): 100 ug via INTRAVENOUS

## 2018-07-24 MED ORDER — FENTANYL CITRATE (PF) 100 MCG/2ML IJ SOLN
INTRAMUSCULAR | Status: AC
  Filled 2018-07-24: qty 2

## 2018-07-24 MED ORDER — BUPIVACAINE-EPINEPHRINE (PF) 0.5% -1:200000 IJ SOLN
INTRAMUSCULAR | Status: DC | PRN
  Administered 2018-07-24: 25 mL

## 2018-07-24 MED ORDER — LACTATED RINGERS IV SOLN
INTRAVENOUS | Status: DC
  Administered 2018-07-24: 10:00:00 via INTRAVENOUS

## 2018-07-24 MED ORDER — MIDAZOLAM HCL 2 MG/2ML IJ SOLN
INTRAMUSCULAR | Status: AC
  Filled 2018-07-24: qty 2

## 2018-07-24 MED ORDER — MIDAZOLAM HCL 2 MG/2ML IJ SOLN
INTRAMUSCULAR | Status: DC | PRN
  Administered 2018-07-24: 2 mg via INTRAVENOUS

## 2018-07-24 MED ORDER — ACETAMINOPHEN 10 MG/ML IV SOLN
INTRAVENOUS | Status: DC | PRN
  Administered 2018-07-24: 1000 mg via INTRAVENOUS

## 2018-07-24 MED ORDER — ONDANSETRON HCL 4 MG/2ML IJ SOLN
INTRAMUSCULAR | Status: AC
  Filled 2018-07-24: qty 2

## 2018-07-24 MED ORDER — PROPOFOL 10 MG/ML IV BOLUS
INTRAVENOUS | Status: DC | PRN
  Administered 2018-07-24: 150 mg via INTRAVENOUS

## 2018-07-24 MED ORDER — CEFAZOLIN SODIUM-DEXTROSE 2-4 GM/100ML-% IV SOLN
INTRAVENOUS | Status: AC
  Filled 2018-07-24: qty 100

## 2018-07-24 MED ORDER — BUPIVACAINE-EPINEPHRINE (PF) 0.5% -1:200000 IJ SOLN
INTRAMUSCULAR | Status: AC
  Filled 2018-07-24: qty 30

## 2018-07-24 MED ORDER — CEFAZOLIN SODIUM-DEXTROSE 2-4 GM/100ML-% IV SOLN
2.0000 g | INTRAVENOUS | Status: AC
  Administered 2018-07-24: 2 g via INTRAVENOUS

## 2018-07-24 MED ORDER — DEXAMETHASONE SODIUM PHOSPHATE 10 MG/ML IJ SOLN
INTRAMUSCULAR | Status: DC | PRN
  Administered 2018-07-24: 5 mg via INTRAVENOUS

## 2018-07-24 MED ORDER — FENTANYL CITRATE (PF) 100 MCG/2ML IJ SOLN
25.0000 ug | INTRAMUSCULAR | Status: DC | PRN
  Administered 2018-07-24 (×3): 25 ug via INTRAVENOUS

## 2018-07-24 MED ORDER — FENTANYL CITRATE (PF) 100 MCG/2ML IJ SOLN
INTRAMUSCULAR | Status: DC | PRN
  Administered 2018-07-24 (×2): 25 ug via INTRAVENOUS

## 2018-07-24 MED ORDER — PROPOFOL 10 MG/ML IV BOLUS
INTRAVENOUS | Status: AC
  Filled 2018-07-24: qty 20

## 2018-07-24 MED FILL — LOSARTAN POTASSIUM 50 MG TA: 50 | 30 days supply | Qty: 30 | Fill #0

## 2018-07-24 SURGICAL SUPPLY — 52 items
BINDER BREAST LRG (GAUZE/BANDAGES/DRESSINGS) IMPLANT
BINDER BREAST MEDIUM (GAUZE/BANDAGES/DRESSINGS) ×3 IMPLANT
BINDER BREAST XLRG (GAUZE/BANDAGES/DRESSINGS) IMPLANT
BLADE PHOTON ILLUMINATED (MISCELLANEOUS) IMPLANT
BLADE SURG 15 STRL SS SAFETY (BLADE) ×6 IMPLANT
BULB RESERV EVAC DRAIN JP 100C (MISCELLANEOUS) IMPLANT
CANISTER SUCT 1200ML W/VALVE (MISCELLANEOUS) ×3 IMPLANT
CHLORAPREP W/TINT 26 (MISCELLANEOUS) ×3 IMPLANT
CLOSURE WOUND 1/2 X4 (GAUZE/BANDAGES/DRESSINGS) ×1
CNTNR SPEC 2.5X3XGRAD LEK (MISCELLANEOUS) ×1
CONT SPEC 4OZ STER OR WHT (MISCELLANEOUS) ×2
CONTAINER SPEC 2.5X3XGRAD LEK (MISCELLANEOUS) ×1 IMPLANT
COVER PROBE FLX POLY STRL (MISCELLANEOUS) ×3 IMPLANT
COVER WAND RF STERILE (DRAPES) ×3 IMPLANT
DEVICE DUBIN SPECIMEN MAMMOGRA (MISCELLANEOUS) ×3 IMPLANT
DRAIN CHANNEL JP 15F RND 16 (MISCELLANEOUS) IMPLANT
DRAPE LAPAROTOMY TRNSV 106X77 (MISCELLANEOUS) ×3 IMPLANT
DRSG GAUZE FLUFF 36X18 (GAUZE/BANDAGES/DRESSINGS) ×3 IMPLANT
DRSG TELFA 3X8 NADH (GAUZE/BANDAGES/DRESSINGS) ×3 IMPLANT
ELECT CAUTERY BLADE TIP 2.5 (TIP) ×3
ELECT REM PT RETURN 9FT ADLT (ELECTROSURGICAL) ×3
ELECTRODE CAUTERY BLDE TIP 2.5 (TIP) ×1 IMPLANT
ELECTRODE REM PT RTRN 9FT ADLT (ELECTROSURGICAL) ×1 IMPLANT
GLOVE BIO SURGEON STRL SZ7.5 (GLOVE) ×3 IMPLANT
GLOVE INDICATOR 8.0 STRL GRN (GLOVE) ×3 IMPLANT
GOWN STRL REUS W/ TWL LRG LVL3 (GOWN DISPOSABLE) ×2 IMPLANT
GOWN STRL REUS W/TWL LRG LVL3 (GOWN DISPOSABLE) ×4
KIT TURNOVER KIT A (KITS) ×3 IMPLANT
LABEL OR SOLS (LABEL) ×3 IMPLANT
MARGIN MAP 10MM (MISCELLANEOUS) ×3 IMPLANT
NEEDLE HYPO 22GX1.5 SAFETY (NEEDLE) ×3 IMPLANT
NEEDLE HYPO 25X1 1.5 SAFETY (NEEDLE) ×6 IMPLANT
PACK BASIN MINOR ARMC (MISCELLANEOUS) ×3 IMPLANT
RETRACTOR RING XSMALL (MISCELLANEOUS) ×1 IMPLANT
RTRCTR WOUND ALEXIS 13CM XS SH (MISCELLANEOUS) ×3
SHEARS FOC LG CVD HARMONIC 17C (MISCELLANEOUS) IMPLANT
SHEARS HARMONIC 9CM CVD (BLADE) IMPLANT
STRIP CLOSURE SKIN 1/2X4 (GAUZE/BANDAGES/DRESSINGS) ×2 IMPLANT
SUT ETHILON 3-0 FS-10 30 BLK (SUTURE) ×3
SUT SILK 2 0 (SUTURE) ×2
SUT SILK 2-0 18XBRD TIE 12 (SUTURE) ×1 IMPLANT
SUT VIC AB 2-0 CT1 27 (SUTURE) ×4
SUT VIC AB 2-0 CT1 TAPERPNT 27 (SUTURE) ×2 IMPLANT
SUT VIC AB 4-0 FS2 27 (SUTURE) ×3 IMPLANT
SUT VICRYL+ 3-0 144IN (SUTURE) ×3 IMPLANT
SUTURE EHLN 3-0 FS-10 30 BLK (SUTURE) ×1 IMPLANT
SWABSTK COMLB BENZOIN TINCTURE (MISCELLANEOUS) ×3 IMPLANT
SYR 10ML LL (SYRINGE) ×3 IMPLANT
SYR BULB IRRIG 60ML STRL (SYRINGE) ×3 IMPLANT
SYR CONTROL 10ML (SYRINGE) ×3 IMPLANT
TAPE TRANSPORE STRL 2 31045 (GAUZE/BANDAGES/DRESSINGS) ×3 IMPLANT
WATER STERILE IRR 1000ML POUR (IV SOLUTION) ×3 IMPLANT

## 2018-07-24 NOTE — Anesthesia Postprocedure Evaluation (Signed)
Anesthesia Post Note  [name]  Procedure(s) Performed: RE-EXCISION OF BREAST LUMPECTOMY LEFT (Left )  Patient location during evaluation: PACU Anesthesia Type: General Level of consciousness: awake and alert and oriented Pain management: pain level controlled Vital Signs Assessment: post-procedure vital signs reviewed and stable Respiratory status: spontaneous breathing, nonlabored ventilation and respiratory function stable Cardiovascular status: blood pressure returned to baseline and stable Postop Assessment: no signs of nausea or vomiting Anesthetic complications: no     Last Vitals:  Vitals:   07/24/18 1151 07/24/18 1206  BP: 109/78 130/73  Pulse: 78   Resp: 11 18  Temp:  36.6 C  SpO2: 97% 100%    Last Pain:  Vitals:   07/24/18 1206  TempSrc: Oral  PainSc: 2                  Jazmaine Fuelling

## 2018-07-24 NOTE — Anesthesia Post-op Follow-up Note (Signed)
Anesthesia QCDR form completed.        

## 2018-07-24 NOTE — Op Note (Signed)
Preoperative diagnosis: Left breast cancer with positive margin post wide excision.  Postoperative diagnosis: Same.  Operative procedure: Resection of medial breast cavity.  Operating Surgeon: Hervey Ard, MD.  Anesthesia: General by LMA, Marcaine 0.5% with 1-200,000's of epinephrine, 25 cc.  Estimated blood loss: Less than 5 cc.  Clinical note: This 48 year old woman had undergone neoadjuvant chemotherapy for breast cancer.  She underwent wide excision and had a positive medial margin with an individual foci measuring up to 3 mm of invasive cancer.  She is return to the operating with this time for planned reexcision of the medial margin.  The patient had SCD stockings for DVT prevention.  She received Ancef prior to the procedure.  Operative note: The patient underwent general anesthesia and tolerated this well.  Local anesthesia was infiltrated for postoperative comfort.  The previous incision was opened with Metzenbaum scissors.  The adipose tissue was elevated off the breast tissue circumferentially and an Peavine wound protector placed.  The medial margin was then exposed and a 6 mm thick slice excised.  This was orientated with the new medial margin on a Telfa pad.  The tissue was sewn to the pad with orientating markers.  This was sent in formalin for routine histology.  The deep tissue was then approximated with interrupted 2-0 Vicryl figure-of-eight sutures.  The adipose layer, fairly scant, was approximated in a similar fashion.  The skin was then closed with a running 4-0 Vicryl subcuticular suture.  Benzoin and Steri-Strips followed by Telfa and Tegaderm dressing was applied.  The patient tolerated the procedure well and was taken recovery in stable condition.

## 2018-07-24 NOTE — Transfer of Care (Signed)
Immediate Anesthesia Transfer of Care Note  [name]  Procedure(s) Performed: RE-EXCISION OF BREAST LUMPECTOMY LEFT (Left )  Patient Location: PACU  Anesthesia Type:General  Level of Consciousness: awake, alert  and oriented  Airway & Oxygen Therapy: Patient Spontanous Breathing and Patient connected to nasal cannula oxygen  Post-op Assessment: Report given to RN and Post -op Vital signs reviewed and stable  Post vital signs: Reviewed and stable  Last Vitals:  Vitals Value Taken Time  BP 129/83 07/24/18 1106  Temp 36.9 C 07/24/18 1106  Pulse 87 07/24/18 1111  Resp 16 07/24/18 1111  SpO2 100 % 07/24/18 1111  Vitals shown include unvalidated device data.  Last Pain:  Vitals:   07/24/18 0850  TempSrc: Oral  PainSc: 2          Complications: No apparent anesthesia complications

## 2018-07-24 NOTE — Anesthesia Preprocedure Evaluation (Addendum)
Anesthesia Evaluation  Patient identified by MRN, date of birth, ID band Patient awake    Reviewed: Allergy & Precautions, H&P , NPO status , Patient's Chart, lab work & pertinent test results, reviewed documented beta blocker date and time   History of Anesthesia Complications (+) PONV, Family history of anesthesia reaction and history of anesthetic complications  Airway Mallampati: II  TM Distance: >3 FB Neck ROM: full    Dental  (+) Caps, Dental Advidsory Given   Pulmonary neg shortness of breath, asthma , neg COPD, neg recent URI, former smoker,           Cardiovascular Exercise Tolerance: Good hypertension, (-) angina(-) CAD, (-) Past MI, (-) Cardiac Stents and (-) CABG (-) dysrhythmias + Valvular Problems/Murmurs MVP      Neuro/Psych  Headaches, PSYCHIATRIC DISORDERS Anxiety Depression    GI/Hepatic Neg liver ROS, GERD  Controlled,  Endo/Other  negative endocrine ROS  Renal/GU negative Renal ROS  negative genitourinary   Musculoskeletal   Abdominal   Peds  Hematology negative hematology ROS (+) anemia ,   Anesthesia Other Findings Past Medical History: No date: Anemia     Comment:  h/o with pregnancy No date: Anxiety No date: Asthma     Comment:  allergy induced-no inhaler No date: Complication of anesthesia No date: Environmental allergies No date: Family history of adverse reaction to anesthesia     Comment:  son-breathing problems-coded 1st time when he was 1 and               had another surgery at 34 and had to be admitted for               breathing problems No date: Family history of breast cancer No date: GERD (gastroesophageal reflux disease) No date: Headache     Comment:  migraines No date: Hypertension No date: Mitral valve disease No date: Mitral valve prolapse No date: Painful menstrual periods No date: PONV (postoperative nausea and vomiting) No date: Vertigo   Reproductive/Obstetrics negative OB ROS                             Anesthesia Physical  Anesthesia Plan  ASA: II  Anesthesia Plan: General   Post-op Pain Management:    Induction: Intravenous  PONV Risk Score and Plan: 3  Airway Management Planned: LMA  Additional Equipment:   Intra-op Plan:   Post-operative Plan: Extubation in OR  Informed Consent: I have reviewed the patients History and Physical, chart, labs and discussed the procedure including the risks, benefits and alternatives for the proposed anesthesia with the patient or authorized representative who has indicated his/her understanding and acceptance.     Dental Advisory Given  Plan Discussed with: Anesthesiologist, CRNA and Surgeon  Anesthesia Plan Comments:        Anesthesia Quick Evaluation

## 2018-07-24 NOTE — H&P (Signed)
No change in clinical history or exam.  For re-excision of left breast cancer primary site.

## 2018-07-25 ENCOUNTER — Telehealth: Payer: Self-pay | Admitting: General Surgery

## 2018-07-25 LAB — SURGICAL PATHOLOGY

## 2018-07-25 NOTE — Telephone Encounter (Signed)
The patient was notified that the reexcision showed no residual invasive cancer.  Single duct foci of DCIS.  Minimal margin 5 mm.  She reports being sore, but reasonably well controlled with tramadol and Tylenol.  She may slide a small ice pack under her compressive wrap if needed.  Follow-up next week as planned.

## 2018-07-28 NOTE — Progress Notes (Signed)
[facility]  [phone] [phone]  ID: Shelby [name] OB: [birth year]  MR#: [id number]  MLY#:[id number]  Patient Care Team: Jerrol Banana., MD as PCP - General (Family Medicine) [name], MD as Consulting Physician (Cardiology)  CHIEF COMPLAINT: Pathologic stage IIa triple negative invasive carcinoma of the lower inner quadrant of the left breast.  INTERVAL HISTORY: Patient returns to clinic today for further evaluation and adjuvant treatment planning.  She had to undergo a reexcision of her breast mass secondary to positive margins.  She currently feels well and is asymptomatic. She does not complain of weakness or fatigue today. She denies any recent fevers or illnesses.  She has no neurologic complaints.  She has a good appetite and denies weight loss.  She denies any chest pain, shortness of breath, cough, or hemoptysis.  She denies any nausea, vomiting, constipation, or diarrhea.  She has no urinary complaints.  Patient offers no further specific complaints today.  REVIEW OF SYSTEMS:   Review of Systems  Constitutional: Negative.  Negative for fever, malaise/fatigue and weight loss.  HENT: Negative.  Negative for congestion, ear pain and sore throat.   Respiratory: Negative.  Negative for cough, hemoptysis and shortness of breath.   Cardiovascular: Negative.  Negative for chest pain and leg swelling.  Gastrointestinal: Negative.  Negative for abdominal pain and melena.  Genitourinary: Negative.  Negative for dysuria.  Musculoskeletal: Negative.  Negative for back pain.  Skin: Negative.  Negative for rash.  Neurological: Negative.  Negative for dizziness, focal weakness, weakness and headaches.  Psychiatric/Behavioral: The patient is nervous/anxious.     As per HPI. Otherwise, a complete review of systems is negative.  PAST MEDICAL HISTORY: Past Medical History:  Diagnosis Date  . Anemia    h/o with pregnancy  .  Anxiety   . Asthma    allergy induced-no inhaler  . Cancer [facility])    breast left   . Complication of anesthesia   . Environmental allergies   . Family history of adverse reaction to anesthesia    son-breathing problems-coded 1st time when he was 2 and had another surgery at 53 and had to be admitted for breathing problems  . Family history of breast cancer   . GERD (gastroesophageal reflux disease)   . Headache    migraines  . Hypertension   . Mitral valve disease   . Mitral valve prolapse   . Painful menstrual periods   . PONV (postoperative nausea and vomiting)   . Vertigo     PAST SURGICAL HISTORY: Past Surgical History:  Procedure Laterality Date  . ABDOMINAL HYSTERECTOMY  2010   supracervical   . AXILLARY LYMPH NODE DISSECTION Left 07/12/2018   Procedure: AXILLARY LYMPH NODE DISSECTION;  Surgeon: Robert Bellow, MD;  Location: [facility] ORS;  Service: General;  Laterality: Left;  . BREAST BIOPSY Left 11/2017   invasive ductal carcinoma and metastatic LN  . BREAST BIOPSY WITH SENTINEL LYMPH NODE BIOPSY AND NEEDLE LOCALIZATION Left 07/12/2018   Procedure: BREAST BIOPSY WIDE EXCISION WITH SENTINEL NODE  AND NEEDLE LOCALIZATION OF OXILLARY NODS LEFT;  Surgeon: Robert Bellow, MD;  Location: [facility] ORS;  Service: General;  Laterality: Left;  . BUNIONECTOMY Bilateral   . MANDIBLE RECONSTRUCTION     age 15-underbite  . PORTACATH PLACEMENT Right 12/31/2017   Procedure: INSERTION PORT-A-CATH;  Surgeon: Robert Bellow, MD;  Location: [facility] ORS;  Service: General;  Laterality: Right;  . RE-EXCISION OF BREAST LUMPECTOMY Left 07/24/2018  Procedure: RE-EXCISION OF BREAST LUMPECTOMY LEFT;  Surgeon: Robert Bellow, MD;  Location: [facility] ORS;  Service: General;  Laterality: Left;  . SHOULDER ARTHROSCOPY WITH OPEN ROTATOR CUFF REPAIR Right 04/26/2017   Procedure: SHOULDER ARTHROSCOPY WITH OPEN ROTATOR CUFF REPAIR,SUBACROMINAL DECOMPRESSION;  Surgeon: Thornton Park, MD;  Location: [facility]  ORS;  Service: Orthopedics;  Laterality: Right;  . TONSILLECTOMY      FAMILY HISTORY: Family History  Problem Relation Age of Onset  . Breast cancer Mother 48  . Diabetes Father   . Cancer Maternal Uncle        colon  . Breast cancer Maternal Grandmother 74    ADVANCED DIRECTIVES (Y/N):  N  HEALTH MAINTENANCE: Social History   Tobacco Use  . Smoking status: Former Smoker    Packs/day: 0.50    Years: 10.00    Pack years: 5.00    Types: Cigarettes    Quit date: 04/20/2007    Years since quitting: 11.2  . Smokeless tobacco: Never Used  . Tobacco comment: social smoker back then  Substance Use Topics  . Alcohol use: Yes    Comment: occas  . Drug use: No     Colonoscopy:  PAP:  Bone density:  Lipid panel:  Allergies  Allergen Reactions  . Hydrocodone Hives    Swollen face - many years ago. Thinks she has tolerated since.  . Iodine Hives  . Peanut Butter Flavor Other (See Comments)    Made mouth tingle  . Shellfish Allergy Hives    Current Outpatient Medications  Medication Sig Dispense Refill  . ALPRAZolam (XANAX) 0.5 MG tablet Take 0.5-1 tablets (0.25-0.5 mg total) by mouth 2 (two) times daily as needed for anxiety. (Patient taking differently: Take 0.25 mg by mouth at bedtime. ) 60 tablet 0  . Cholecalciferol (VITAMIN D) 50 MCG (2000 UT) CAPS Take 2,000 Units by mouth daily.    Marland Kitchen EPINEPHrine 0.3 mg/0.3 mL IJ SOAJ injection Inject 0.3 mLs (0.3 mg total) into the muscle once. (Patient taking differently: Inject 0.3 mg into the muscle daily as needed for anaphylaxis. ) 1 Device 12  . famotidine (PEPCID) 20 MG tablet Take 20 mg by mouth daily.   0  . ibuprofen (ADVIL,MOTRIN) 200 MG tablet Take 200 mg by mouth daily as needed for mild pain.     Marland Kitchen lidocaine-prilocaine (EMLA) cream Apply to affected area once (Patient taking differently: Apply 1 application topically daily as needed (Prior to porta-cath access). ) 30 g 3  . losartan (COZAAR) 50 MG tablet TAKE 1 TABLET  BY MOUTH DAILY (Patient taking differently: Take 50 mg by mouth at bedtime. ) 90 tablet 0  . mometasone (NASONEX) 50 MCG/ACT nasal spray Place 2 sprays into the nose daily. (Patient taking differently: Place 2 sprays into the nose daily as needed (allergies). ) 17 g 12  . montelukast (SINGULAIR) 10 MG tablet TAKE 1 TABLET BY MOUTH AT BEDTIME (Patient taking differently: Take 10 mg by mouth daily as needed (allergies). ) 30 tablet 12  . ondansetron (ZOFRAN) 8 MG tablet Take 1 tablet (8 mg total) by mouth 2 (two) times daily as needed. 30 tablet 2  . oxycodone (OXY-IR) 5 MG capsule Take 1 capsule (5 mg total) by mouth every 4 (four) hours as needed. 15 capsule 0  . prochlorperazine (COMPAZINE) 10 MG tablet Take 1 tablet (10 mg total) by mouth every 6 (six) hours as needed (Nausea or vomiting). 60 tablet 2  . traMADol (ULTRAM) 50 MG tablet Take 1  tablet (50 mg total) by mouth every 4 (four) hours as needed. 10 tablet 0  . HYDROcodone-acetaminophen (NORCO/VICODIN) 5-325 MG tablet Take 1 tablet by mouth every 4 (four) hours as needed for moderate pain. (Patient not taking: Reported on 07/31/2018) 20 tablet 0   No current facility-administered medications for this visit.     OBJECTIVE: Vitals:   07/31/18 1011  BP: 138/78  Pulse: 87  Temp: (!) 97.3 F (36.3 C)     Body mass index is 22.87 kg/m.    ECOG FS:0 - Asymptomatic  General: Well-developed, well-nourished, no acute distress. Eyes: Pink conjunctiva, anicteric sclera. HEENT: Normocephalic, moist mucous membranes. Breast: Left lumpectomy with well-healing surgical scar.  Exam deferred today. Lungs: Clear to auscultation bilaterally. Heart: Regular rate and rhythm. No rubs, murmurs, or gallops. Abdomen: Soft, nontender, nondistended. No organomegaly noted, normoactive bowel sounds. Musculoskeletal: No edema, cyanosis, or clubbing. Neuro: Alert, answering all questions appropriately. Cranial nerves grossly intact. Skin: No rashes or petechiae  noted. Psych: Normal affect.  LAB RESULTS:  Lab Results  Component Value Date   NA 138 06/20/2018   K 4.2 06/20/2018   CL 105 06/20/2018   CO2 27 06/20/2018   GLUCOSE 94 06/20/2018   BUN 17 06/20/2018   CREATININE 0.51 06/20/2018   CALCIUM 9.5 06/20/2018   PROT 6.7 06/20/2018   ALBUMIN 4.1 06/20/2018   AST 32 06/20/2018   ALT 46 (H) 06/20/2018   ALKPHOS 71 06/20/2018   BILITOT 0.6 06/20/2018   GFRNONAA >60 06/20/2018   GFRAA >60 06/20/2018    Lab Results  Component Value Date   WBC 2.4 (L) 07/09/2018   NEUTROABS 1.2 (L) 07/09/2018   HGB 11.1 (L) 07/09/2018   HCT 33.6 (L) 07/09/2018   MCV 100.0 07/09/2018   PLT 182 07/09/2018     STUDIES: Nm Sentinel Node Injection  Result Date: 07/12/2018 CLINICAL DATA:  Left breast cancer. EXAM: NUCLEAR MEDICINE BREAST LYMPHOSCINTIGRAPHY TECHNIQUE: Intradermal injection of radiopharmaceutical was performed at the 12 o'clock, 3 o'clock, 6 o'clock, and 9 o'clock positions around the left nipple. The patient was then sent to the operating room where the sentinel node(s) were identified and removed by the surgeon. RADIOPHARMACEUTICALS:  Total of 1 mCi Millipore-filtered Technetium-72msulfur colloid, injected in four aliquots of 0.25 mCi each. IMPRESSION: Uncomplicated intradermal injection of a total of 1 mCi Technetium-989mulfur colloid for purposes of sentinel node identification. Electronically Signed   By: HeJacqulynn Cadet.D.   On: 07/12/2018 10:53   Mm Breast Surgical Specimen  Result Date: 07/12/2018 CLINICAL DATA:  Patient status post surgical resection of localized lymph node. EXAM: SPECIMEN RADIOGRAPH OF THE LEFT BREAST COMPARISON:  Previous exam(s). FINDINGS: Status post excision of the left breast. The wire tip and lymph node are present and are marked for pathology. No biopsy marking clip is identified within the resected lymph node. This was discussed with Dr. ByBary CastillaA second specimen was submitted demonstrating ribbon  shaped marking clip within the middle of the specimen. IMPRESSION: Specimen radiograph of the left breast. Electronically Signed   By: DrLovey Newcomer.D.   On: 07/12/2018 12:12   [facility] Breast Loc Dev   1st Lesion  Inc [facility]  Result Date: 07/12/2018 CLINICAL DATA:  Patient for preoperative localization low left axillary lymph node. EXAM: NEEDLE LOCALIZATION OF THE LEFT BREAST WITH ULTRASOUND GUIDANCE COMPARISON:  Previous exams. FINDINGS: Patient presents for needle localization prior to left lumpectomy and axillary resection. I met with the patient and we  discussed the procedure of needle localization including benefits and alternatives. We discussed the high likelihood of a successful procedure. We discussed the risks of the procedure, including infection, bleeding, tissue injury, and further surgery. Informed, written consent was given. The usual time-out protocol was performed immediately prior to the procedure. Using ultrasound guidance, sterile technique, 1% lidocaine and a 5 cm modified Kopans needle, left low axillary lymph node was localized using lateral approach. The images were marked for Dr. Bary Castilla. IMPRESSION: Needle localization left breast. No apparent complications. Electronically Signed   By: Lovey Newcomer M.D.   On: 07/12/2018 09:18    ASSESSMENT: Pathologic stage IIa triple negative invasive carcinoma of the lower inner quadrant of the left breast.  PLAN:    1.  Pathologic stage IIa triple negative invasive carcinoma of the lower inner quadrant of the left breast: CT scan results from January 01, 2018 did not reveal any metastatic disease.  Patient completed her neoadjuvant chemotherapy with Adriamycin and Cytoxan, followed by carboplatinum and Taxol, on Jun 20, 2018.  Her initial resection was completed on July 12, 2018 and then she underwent reexcision for positive margins on July 24, 2018.  Patient will now proceed with adjuvant XRT and a referral was given to radiation oncology.   She has expressed interest in pursuing adjuvant capecitabine, but given toxicity concerns will hold treatment until after XRT is complete.  Return to clinic in approximately 8 to 10 weeks near the end of her radiation to discuss capecitabine treatment.   2.  Anxiety: Chronic.  Continue Xanax as needed. 3.  Anemia: Improving.  Patient's most recent hemoglobin is now 11.1.   4.  Headache: Patient does not complain of this today.  Continue ibuprofen sparingly as needed. 5.  Neutropenia: Chronic and unchanged. 6.  Weakness and fatigue: Resolved. 7.  Liver enzymes: Resolved. 8.  Thrombocytopenia: Resolved.  Patient expressed understanding and was in agreement with this plan. She also understands that She can call clinic at any time with any questions, concerns, or complaints.   Cancer Staging Primary cancer of lower-inner quadrant of left female breast [facility]) Staging form: Breast, AJCC 8th Edition - Clinical stage from 12/24/2017: Stage IIIB (cT2, cN1, cM0, G3, ER-, PR-, HER2-) - Signed by Lloyd Huger, MD on 12/24/2017 - Pathologic stage from 08/01/2018: No Stage Recommended (ypT1c, pN1a, cM0, ER-, PR-, HER2-) - Signed by Lloyd Huger, MD on 08/01/2018   Lloyd Huger, MD   08/01/2018 6:25 AM

## 2018-07-31 ENCOUNTER — Encounter: Payer: Self-pay | Admitting: Oncology

## 2018-07-31 ENCOUNTER — Inpatient Hospital Stay: Payer: No Typology Code available for payment source | Attending: Oncology | Admitting: Oncology

## 2018-07-31 ENCOUNTER — Other Ambulatory Visit: Payer: Self-pay

## 2018-07-31 ENCOUNTER — Ambulatory Visit: Payer: No Typology Code available for payment source | Admitting: Oncology

## 2018-07-31 VITALS — BP 138/78 | HR 87 | Temp 97.3°F | Ht 66.0 in | Wt 141.7 lb

## 2018-07-31 DIAGNOSIS — D649 Anemia, unspecified: Secondary | ICD-10-CM | POA: Diagnosis not present

## 2018-07-31 DIAGNOSIS — F419 Anxiety disorder, unspecified: Secondary | ICD-10-CM | POA: Diagnosis not present

## 2018-07-31 DIAGNOSIS — C50312 Malignant neoplasm of lower-inner quadrant of left female breast: Secondary | ICD-10-CM

## 2018-07-31 DIAGNOSIS — Z87891 Personal history of nicotine dependence: Secondary | ICD-10-CM | POA: Diagnosis not present

## 2018-07-31 DIAGNOSIS — Z171 Estrogen receptor negative status [ER-]: Secondary | ICD-10-CM | POA: Diagnosis not present

## 2018-07-31 DIAGNOSIS — D709 Neutropenia, unspecified: Secondary | ICD-10-CM | POA: Diagnosis not present

## 2018-07-31 NOTE — Progress Notes (Signed)
Patient stated that she had been doing well after her surgeries.

## 2018-08-01 ENCOUNTER — Encounter: Payer: Self-pay | Admitting: General Surgery

## 2018-08-01 ENCOUNTER — Ambulatory Visit (INDEPENDENT_AMBULATORY_CARE_PROVIDER_SITE_OTHER): Payer: No Typology Code available for payment source | Admitting: General Surgery

## 2018-08-01 VITALS — BP 129/92 | HR 80 | Temp 97.5°F | Ht 66.0 in | Wt 141.0 lb

## 2018-08-01 DIAGNOSIS — C50312 Malignant neoplasm of lower-inner quadrant of left female breast: Secondary | ICD-10-CM

## 2018-08-01 NOTE — Progress Notes (Signed)
Patient ID: Shelby [name], female   DOB: 08/12/1970, 48 y.o.   MRN: [id number]  Chief Complaint  Patient presents with  . Routine Post Op    HPI Shelby [name] is a 48 y.o. female here today for her post op re-excision left breast done on 07/24/2018. Patient state she is doing well.  HPI  Past Medical History:  Diagnosis Date  . Anemia    h/o with pregnancy  . Anxiety   . Asthma    allergy induced-no inhaler  . Cancer [facility])    breast left   . Complication of anesthesia   . Environmental allergies   . Family history of adverse reaction to anesthesia    son-breathing problems-coded 1st time when he was 2 and had another surgery at 31 and had to be admitted for breathing problems  . Family history of breast cancer   . GERD (gastroesophageal reflux disease)   . Headache    migraines  . Hypertension   . Mitral valve disease   . Mitral valve prolapse   . Painful menstrual periods   . PONV (postoperative nausea and vomiting)   . Vertigo     Past Surgical History:  Procedure Laterality Date  . ABDOMINAL HYSTERECTOMY  2010   supracervical   . AXILLARY LYMPH NODE DISSECTION Left 07/12/2018   Procedure: AXILLARY LYMPH NODE DISSECTION;  Surgeon: Robert Bellow, MD;  Location: [facility] ORS;  Service: General;  Laterality: Left;  . BREAST BIOPSY Left 11/2017   invasive ductal carcinoma and metastatic LN  . BREAST BIOPSY WITH SENTINEL LYMPH NODE BIOPSY AND NEEDLE LOCALIZATION Left 07/12/2018   Procedure: BREAST BIOPSY WIDE EXCISION WITH SENTINEL NODE  AND NEEDLE LOCALIZATION OF OXILLARY NODS LEFT;  Surgeon: Robert Bellow, MD;  Location: [facility] ORS;  Service: General;  Laterality: Left;  . BUNIONECTOMY Bilateral   . MANDIBLE RECONSTRUCTION     age 49-underbite  . PORTACATH PLACEMENT Right 12/31/2017   Procedure: INSERTION PORT-A-CATH;  Surgeon: Robert Bellow, MD;  Location: [facility] ORS;  Service: General;  Laterality: Right;  . RE-EXCISION OF BREAST LUMPECTOMY Left 07/24/2018    Procedure: RE-EXCISION OF BREAST LUMPECTOMY LEFT;  Surgeon: Robert Bellow, MD;  Location: [facility] ORS;  Service: General;  Laterality: Left;  . SHOULDER ARTHROSCOPY WITH OPEN ROTATOR CUFF REPAIR Right 04/26/2017   Procedure: SHOULDER ARTHROSCOPY WITH OPEN ROTATOR CUFF REPAIR,SUBACROMINAL DECOMPRESSION;  Surgeon: Thornton Park, MD;  Location: [facility] ORS;  Service: Orthopedics;  Laterality: Right;  . TONSILLECTOMY      Family History  Problem Relation Age of Onset  . Breast cancer Mother 8  . Diabetes Father   . Cancer Maternal Uncle        colon  . Breast cancer Maternal Grandmother 70    Social History Social History   Tobacco Use  . Smoking status: Former Smoker    Packs/day: 0.50    Years: 10.00    Pack years: 5.00    Types: Cigarettes    Quit date: 04/20/2007    Years since quitting: 11.2  . Smokeless tobacco: Never Used  . Tobacco comment: social smoker back then  Substance Use Topics  . Alcohol use: Yes    Comment: occas  . Drug use: No    Allergies  Allergen Reactions  . Hydrocodone Hives    Swollen face - many years ago. Thinks she has tolerated since.  . Iodine Hives  . Peanut Butter Flavor Other (See Comments)    Made mouth tingle  .  Shellfish Allergy Hives    Current Outpatient Medications  Medication Sig Dispense Refill  . ALPRAZolam (XANAX) 0.5 MG tablet Take 0.5-1 tablets (0.25-0.5 mg total) by mouth 2 (two) times daily as needed for anxiety. (Patient taking differently: Take 0.25 mg by mouth at bedtime. ) 60 tablet 0  . Cholecalciferol (VITAMIN D) 50 MCG (2000 UT) CAPS Take 2,000 Units by mouth daily.    Marland Kitchen EPINEPHrine 0.3 mg/0.3 mL IJ SOAJ injection Inject 0.3 mLs (0.3 mg total) into the muscle once. (Patient taking differently: Inject 0.3 mg into the muscle daily as needed for anaphylaxis. ) 1 Device 12  . famotidine (PEPCID) 20 MG tablet Take 20 mg by mouth daily.   0  . HYDROcodone-acetaminophen (NORCO/VICODIN) 5-325 MG tablet Take 1 tablet by  mouth every 4 (four) hours as needed for moderate pain. 20 tablet 0  . ibuprofen (ADVIL,MOTRIN) 200 MG tablet Take 200 mg by mouth daily as needed for mild pain.     Marland Kitchen lidocaine-prilocaine (EMLA) cream Apply to affected area once (Patient taking differently: Apply 1 application topically daily as needed (Prior to porta-cath access). ) 30 g 3  . losartan (COZAAR) 50 MG tablet TAKE 1 TABLET BY MOUTH DAILY (Patient taking differently: Take 50 mg by mouth at bedtime. ) 90 tablet 0  . mometasone (NASONEX) 50 MCG/ACT nasal spray Place 2 sprays into the nose daily. (Patient taking differently: Place 2 sprays into the nose daily as needed (allergies). ) 17 g 12  . montelukast (SINGULAIR) 10 MG tablet TAKE 1 TABLET BY MOUTH AT BEDTIME (Patient taking differently: Take 10 mg by mouth daily as needed (allergies). ) 30 tablet 12  . ondansetron (ZOFRAN) 8 MG tablet Take 1 tablet (8 mg total) by mouth 2 (two) times daily as needed. 30 tablet 2  . oxycodone (OXY-IR) 5 MG capsule Take 1 capsule (5 mg total) by mouth every 4 (four) hours as needed. 15 capsule 0  . prochlorperazine (COMPAZINE) 10 MG tablet Take 1 tablet (10 mg total) by mouth every 6 (six) hours as needed (Nausea or vomiting). 60 tablet 2  . traMADol (ULTRAM) 50 MG tablet Take 1 tablet (50 mg total) by mouth every 4 (four) hours as needed. 10 tablet 0   No current facility-administered medications for this visit.     Review of Systems Review of Systems  Blood pressure (!) 129/92, pulse 80, temperature (!) 97.5 F (36.4 C), temperature source Skin, height 5\' 6"  (1.676 m), weight 141 lb (64 kg), SpO2 97 %.  Physical Exam Physical Exam Chest:       Data Reviewed A. BREAST, LEFT, MEDIAL MARGIN; RE-EXCISION:  - DUCTAL CARCINOMA IN SITU (DCIS), LIMITED TO A SINGLE DUCT.  - NEGATIVE FOR INVASIVE CARCINOMA.  - MARGINS ARE UNINVOLVED. CLOSEST [place], 5  MM.  - CHANGES FROM PREVIOUS SURGERY.   No residual  invasive cancer.  Assessment Doing well post reexcision.  Plan  Return in one month. The patient is aware to call back for any questions or concerns.   Arrangements are in place for radiation oncology assessment on August 13, 2018.   HPI, Physical Exam, Assessment and Plan have been scribed under the direction and in the presence of Hervey Ard, MD.  Gaspar Cola, CMA  I have completed the exam and reviewed the above documentation for accuracy and completeness.  I agree with the above.  Haematologist has been used and any errors in dictation or transcription are unintentional.  Hervey Ard, M.D., F.A.C.S. Shelby [name] 08/02/2018, 1:03 PM

## 2018-08-12 ENCOUNTER — Other Ambulatory Visit: Payer: Self-pay

## 2018-08-12 ENCOUNTER — Encounter: Payer: Self-pay | Admitting: Oncology

## 2018-08-13 ENCOUNTER — Ambulatory Visit
Admission: RE | Admit: 2018-08-13 | Discharge: 2018-08-13 | Disposition: A | Discharge status: Home / Self Care | Payer: No Typology Code available for payment source | Source: Ambulatory Visit | Attending: Radiation Oncology | Admitting: Radiation Oncology

## 2018-08-13 ENCOUNTER — Encounter: Payer: Self-pay | Admitting: Radiation Oncology

## 2018-08-13 ENCOUNTER — Other Ambulatory Visit: Payer: Self-pay

## 2018-08-13 ENCOUNTER — Inpatient Hospital Stay: Payer: No Typology Code available for payment source

## 2018-08-13 DIAGNOSIS — Z803 Family history of malignant neoplasm of breast: Secondary | ICD-10-CM | POA: Insufficient documentation

## 2018-08-13 DIAGNOSIS — Z87891 Personal history of nicotine dependence: Secondary | ICD-10-CM | POA: Diagnosis not present

## 2018-08-13 DIAGNOSIS — I341 Nonrheumatic mitral (valve) prolapse: Secondary | ICD-10-CM | POA: Diagnosis not present

## 2018-08-13 DIAGNOSIS — Z95828 Presence of other vascular implants and grafts: Secondary | ICD-10-CM

## 2018-08-13 DIAGNOSIS — J45909 Unspecified asthma, uncomplicated: Secondary | ICD-10-CM | POA: Insufficient documentation

## 2018-08-13 DIAGNOSIS — Z79899 Other long term (current) drug therapy: Secondary | ICD-10-CM | POA: Diagnosis not present

## 2018-08-13 DIAGNOSIS — D649 Anemia, unspecified: Secondary | ICD-10-CM | POA: Insufficient documentation

## 2018-08-13 DIAGNOSIS — F419 Anxiety disorder, unspecified: Secondary | ICD-10-CM | POA: Diagnosis not present

## 2018-08-13 DIAGNOSIS — C50312 Malignant neoplasm of lower-inner quadrant of left female breast: Secondary | ICD-10-CM | POA: Diagnosis present

## 2018-08-13 DIAGNOSIS — K219 Gastro-esophageal reflux disease without esophagitis: Secondary | ICD-10-CM | POA: Insufficient documentation

## 2018-08-13 DIAGNOSIS — I10 Essential (primary) hypertension: Secondary | ICD-10-CM | POA: Insufficient documentation

## 2018-08-13 DIAGNOSIS — Z9221 Personal history of antineoplastic chemotherapy: Secondary | ICD-10-CM | POA: Diagnosis not present

## 2018-08-13 DIAGNOSIS — C779 Secondary and unspecified malignant neoplasm of lymph node, unspecified: Secondary | ICD-10-CM | POA: Insufficient documentation

## 2018-08-13 DIAGNOSIS — Z171 Estrogen receptor negative status [ER-]: Secondary | ICD-10-CM | POA: Diagnosis not present

## 2018-08-13 MED ORDER — HEPARIN SOD (PORK) LOCK FLUSH 100 UNIT/ML IV SOLN
500.0000 [IU] | Freq: Once | INTRAVENOUS | Status: AC
  Administered 2018-08-13: 10:00:00 500 [IU] via INTRAVENOUS

## 2018-08-13 MED ORDER — SODIUM CHLORIDE 0.9% FLUSH
10.0000 mL | Freq: Once | INTRAVENOUS | Status: AC
  Administered 2018-08-13: 10 mL via INTRAVENOUS
  Filled 2018-08-13: qty 10

## 2018-08-13 NOTE — Consult Note (Signed)
NEW PATIENT EVALUATION  Name: Shelby [name]  MRN: [id number]  Date:   08/13/2018     DOB: [birth year]   This 48 y.o. female patient presents to the clinic for initial evaluation of pathologic stage IIa (T1 cN1 aM0) triple negative invasive mammary carcinoma of the left breast status post neoadjuvant chemotherapy plus wide local excision and lymph node dissection.  REFERRING PHYSICIAN: Jerrol Banana.,*  CHIEF COMPLAINT:  Chief Complaint  Patient presents with  . Breast Cancer    Initial consultation    DIAGNOSIS: The encounter diagnosis was Primary cancer of lower-inner quadrant of left female breast ([place]).   PREVIOUS INVESTIGATIONS:  Surgical pathology report reviewed Mammogram and ultrasound reviewed Clinical notes reviewed  HPI: Patient is a 48 year old female [employer] of [employer] in [place] who presented around the first of the year with a self discovered left breast mass.  Ultrasound and mammogram confirmed a palpable mass in the 8 o'clock position of the left breast with suspicious left axillary lymph nodes.  Core biopsy of the lesion showed invasive ductal carcinoma as well as metastatic carcinoma in 1 axillary lymph node.  Carcinoma appears to be grade 3 tumor was triple negative.  Patient underwent dose dense carboplatinum and Taxol along with Adriamycin and Cytoxan.  She underwent a wide local excision after neoadjuvant chemotherapy showing a overall grade 3 1.1 cm invasive mammary carcinoma triple negative.  Ductal carcinoma in situ was present.  There was positive margin the medial margin which was unifocal she did undergo repeat reexcision.  Patient had 1 sentinel lymph node positive for micro metastatic disease as well as 1 of another 11 lymph nodes submitted.  Patient is now referred to radiation oncology for consideration of treatment she is doing fairly well still has some slight tenderness at the scar site which we would expect she also had some  skin peeling underneath a Steri-Strip.  She is otherwise doing well and without complaint.  PLANNED TREATMENT REGIMEN: Left breast and peripheral emphatic radiation  PAST MEDICAL HISTORY:  has a past medical history of Anemia, Anxiety, Asthma, Cancer ([place]), Complication of anesthesia, Environmental allergies, Family history of adverse reaction to anesthesia, Family history of breast cancer, GERD (gastroesophageal reflux disease), Headache, Hypertension, Mitral valve disease, Mitral valve prolapse, Painful menstrual periods, PONV (postoperative nausea and vomiting), and Vertigo.    PAST SURGICAL HISTORY:  Past Surgical History:  Procedure Laterality Date  . ABDOMINAL HYSTERECTOMY  2010   supracervical   . AXILLARY LYMPH NODE DISSECTION Left 07/12/2018   Procedure: AXILLARY LYMPH NODE DISSECTION;  Surgeon: Robert Bellow, MD;  Location: [facility] ORS;  Service: General;  Laterality: Left;  . BREAST BIOPSY Left 11/2017   invasive ductal carcinoma and metastatic LN  . BREAST BIOPSY WITH SENTINEL LYMPH NODE BIOPSY AND NEEDLE LOCALIZATION Left 07/12/2018   Procedure: BREAST BIOPSY WIDE EXCISION WITH SENTINEL NODE  AND NEEDLE LOCALIZATION OF OXILLARY NODS LEFT;  Surgeon: Robert Bellow, MD;  Location: [facility] ORS;  Service: General;  Laterality: Left;  . BUNIONECTOMY Bilateral   . MANDIBLE RECONSTRUCTION     age 48-underbite  . PORTACATH PLACEMENT Right 12/31/2017   Procedure: INSERTION PORT-A-CATH;  Surgeon: Robert Bellow, MD;  Location: [facility] ORS;  Service: General;  Laterality: Right;  . RE-EXCISION OF BREAST LUMPECTOMY Left 07/24/2018   Procedure: RE-EXCISION OF BREAST LUMPECTOMY LEFT;  Surgeon: Robert Bellow, MD;  Location: [facility];  Service: General;  Laterality: Left;  . SHOULDER ARTHROSCOPY WITH OPEN ROTATOR CUFF  REPAIR Right 04/26/2017   Procedure: SHOULDER ARTHROSCOPY WITH OPEN ROTATOR CUFF REPAIR,SUBACROMINAL DECOMPRESSION;  Surgeon: Thornton Park, MD;  Location: [facility] ORS;   Service: Orthopedics;  Laterality: Right;  . TONSILLECTOMY      FAMILY HISTORY: family history includes Breast cancer (age of onset: 45) in her mother; Breast cancer (age of onset: 41) in her maternal grandmother; Cancer in her maternal uncle; Diabetes in her father.  SOCIAL HISTORY:  reports that she quit smoking about 11 years ago. Her smoking use included cigarettes. She has a 5.00 pack-year smoking history. She has never used smokeless tobacco. She reports current alcohol use. She reports that she does not use drugs.  ALLERGIES: Hydrocodone, Iodine, Peanut butter flavor, and Shellfish allergy  MEDICATIONS:  Current Outpatient Medications  Medication Sig Dispense Refill  . ALPRAZolam (XANAX) 0.5 MG tablet Take 0.5-1 tablets (0.25-0.5 mg total) by mouth 2 (two) times daily as needed for anxiety. (Patient taking differently: Take 0.25 mg by mouth at bedtime. ) 60 tablet 0  . Cholecalciferol (VITAMIN D) 50 MCG (2000 UT) CAPS Take 2,000 Units by mouth daily.    Marland Kitchen EPINEPHrine 0.3 mg/0.3 mL IJ SOAJ injection Inject 0.3 mLs (0.3 mg total) into the muscle once. (Patient taking differently: Inject 0.3 mg into the muscle daily as needed for anaphylaxis. ) 1 Device 12  . famotidine (PEPCID) 20 MG tablet Take 20 mg by mouth daily.   0  . ibuprofen (ADVIL,MOTRIN) 200 MG tablet Take 200 mg by mouth daily as needed for mild pain.     Marland Kitchen lidocaine-prilocaine (EMLA) cream Apply to affected area once (Patient taking differently: Apply 1 application topically daily as needed (Prior to porta-cath access). ) 30 g 3  . losartan (COZAAR) 50 MG tablet TAKE 1 TABLET BY MOUTH DAILY (Patient taking differently: Take 50 mg by mouth at bedtime. ) 90 tablet 0  . mometasone (NASONEX) 50 MCG/ACT nasal spray Place 2 sprays into the nose daily. (Patient taking differently: Place 2 sprays into the nose daily as needed (allergies). ) 17 g 12  . montelukast (SINGULAIR) 10 MG tablet TAKE 1 TABLET BY MOUTH AT BEDTIME (Patient  taking differently: Take 10 mg by mouth daily as needed (allergies). ) 30 tablet 12  . ondansetron (ZOFRAN) 8 MG tablet Take 1 tablet (8 mg total) by mouth 2 (two) times daily as needed. 30 tablet 2  . prochlorperazine (COMPAZINE) 10 MG tablet Take 1 tablet (10 mg total) by mouth every 6 (six) hours as needed (Nausea or vomiting). 60 tablet 2   No current facility-administered medications for this encounter.     ECOG PERFORMANCE STATUS:  0 - Asymptomatic  REVIEW OF SYSTEMS: Patient denies any weight loss, fatigue, weakness, fever, chills or night sweats. Patient denies any loss of vision, blurred vision. Patient denies any ringing  of the ears or hearing loss. No irregular heartbeat. Patient denies heart murmur or history of fainting. Patient denies any chest pain or pain radiating to her upper extremities. Patient denies any shortness of breath, difficulty breathing at night, cough or hemoptysis. Patient denies any swelling in the lower legs. Patient denies any nausea vomiting, vomiting of blood, or coffee ground material in the vomitus. Patient denies any stomach pain. Patient states has had normal bowel movements no significant constipation or diarrhea. Patient denies any dysuria, hematuria or significant nocturia. Patient denies any problems walking, swelling in the joints or loss of balance. Patient denies any skin changes, loss of hair or loss of weight. Patient denies  any excessive worrying or anxiety or significant depression. Patient denies any problems with insomnia. Patient denies excessive thirst, polyuria, polydipsia. Patient denies any swollen glands, patient denies easy bruising or easy bleeding. Patient denies any recent infections, allergies or URI. Patient "s visual fields have not changed significantly in recent time.   PHYSICAL EXAM: BP (!) (P) 151/89 (BP Location: Left Arm, Patient Position: Sitting)   Pulse (P) 81   Temp (P) 98 F (36.7 C) (Tympanic)   Resp (P) 18   Wt (P) 143  lb 13.6 oz (65.2 kg)   BMI (P) 23.22 kg/m  Left breast has a wide local excision scar in the 6 o'clock position of the left breast which is healing well small area of slight skin ulceration secondary to Steri-Strip.  No dominant mass or nodularity is noted in either breast in 2 positions examined no axillary or supraclavicular adenopathy is identified.  Well-developed well-nourished patient in NAD. HEENT reveals PERLA, EOMI, discs not visualized.  Oral cavity is clear. No oral mucosal lesions are identified. Neck is clear without evidence of cervical or supraclavicular adenopathy. Lungs are clear to A&P. Cardiac examination is essentially unremarkable with regular rate and rhythm without murmur rub or thrill. Abdomen is benign with no organomegaly or masses noted. Motor sensory and DTR levels are equal and symmetric in the upper and lower extremities. Cranial nerves II through XII are grossly intact. Proprioception is intact. No peripheral adenopathy or edema is identified. No motor or sensory levels are noted. Crude visual fields are within normal range.  LABORATORY DATA: Pathology and cytology reports reviewed    RADIOLOGY RESULTS: Mammograms and ultrasounds reviewed   IMPRESSION: Stage IIa triple negative invasive mammary carcinoma the left breast status post neoadjuvant chemotherapy plus wide local excision and axillary lymph node dissection in 48 year old female  PLAN: At this time have recommended whole breast and peripheral lymphatic radiation.  Would take both areas to 5040 cGy in 28 fractions.  Risks and benefits of treatment including possible occlusion of superficial lung fatigue skin reaction alteration of blood counts and slight chance of lymphedema of her left upper extremity all were reviewed with the patient I have emphasized she needs to exercise her left upper extremity is much as possible.  I have personally set up and ordered CT simulation for early next week to allow the skin to  heal little more with a Steri-Strip as cause some ulceration.  Patient comprehends my treatment plan well.  I would like to take this opportunity to thank you for allowing me to participate in the care of your patient.Noreene Filbert, MD

## 2018-08-16 ENCOUNTER — Other Ambulatory Visit: Payer: Self-pay

## 2018-08-19 ENCOUNTER — Ambulatory Visit
Admission: RE | Admit: 2018-08-19 | Discharge: 2018-08-19 | Disposition: A | Discharge status: Home / Self Care | Payer: No Typology Code available for payment source | Source: Ambulatory Visit | Attending: Radiation Oncology | Admitting: Radiation Oncology

## 2018-08-19 ENCOUNTER — Other Ambulatory Visit: Payer: Self-pay

## 2018-08-19 DIAGNOSIS — Z510 Encounter for antineoplastic radiation therapy: Secondary | ICD-10-CM | POA: Insufficient documentation

## 2018-08-19 DIAGNOSIS — C50312 Malignant neoplasm of lower-inner quadrant of left female breast: Secondary | ICD-10-CM | POA: Insufficient documentation

## 2018-08-19 DIAGNOSIS — Z171 Estrogen receptor negative status [ER-]: Secondary | ICD-10-CM | POA: Insufficient documentation

## 2018-08-20 DIAGNOSIS — Z510 Encounter for antineoplastic radiation therapy: Secondary | ICD-10-CM | POA: Diagnosis not present

## 2018-08-26 ENCOUNTER — Ambulatory Visit
Admission: RE | Admit: 2018-08-26 | Discharge: 2018-08-26 | Disposition: A | Discharge status: Home / Self Care | Payer: No Typology Code available for payment source | Source: Ambulatory Visit | Attending: Radiation Oncology | Admitting: Radiation Oncology

## 2018-08-26 ENCOUNTER — Other Ambulatory Visit: Payer: Self-pay

## 2018-08-26 ENCOUNTER — Other Ambulatory Visit: Payer: Self-pay | Admitting: *Deleted

## 2018-08-26 DIAGNOSIS — C50312 Malignant neoplasm of lower-inner quadrant of left female breast: Secondary | ICD-10-CM

## 2018-08-26 DIAGNOSIS — Z510 Encounter for antineoplastic radiation therapy: Secondary | ICD-10-CM | POA: Diagnosis not present

## 2018-08-27 ENCOUNTER — Ambulatory Visit
Admission: RE | Admit: 2018-08-27 | Discharge: 2018-08-27 | Disposition: A | Discharge status: Home / Self Care | Payer: No Typology Code available for payment source | Source: Ambulatory Visit | Attending: Radiation Oncology | Admitting: Radiation Oncology

## 2018-08-27 ENCOUNTER — Other Ambulatory Visit: Payer: Self-pay

## 2018-08-27 DIAGNOSIS — Z510 Encounter for antineoplastic radiation therapy: Secondary | ICD-10-CM | POA: Diagnosis not present

## 2018-08-28 ENCOUNTER — Ambulatory Visit
Admission: RE | Admit: 2018-08-28 | Discharge: 2018-08-28 | Disposition: A | Discharge status: Home / Self Care | Payer: No Typology Code available for payment source | Source: Ambulatory Visit | Attending: Radiation Oncology | Admitting: Radiation Oncology

## 2018-08-28 ENCOUNTER — Other Ambulatory Visit: Payer: Self-pay

## 2018-08-28 DIAGNOSIS — Z510 Encounter for antineoplastic radiation therapy: Secondary | ICD-10-CM | POA: Diagnosis not present

## 2018-08-29 ENCOUNTER — Other Ambulatory Visit: Payer: Self-pay

## 2018-08-29 ENCOUNTER — Ambulatory Visit
Admission: RE | Admit: 2018-08-29 | Discharge: 2018-08-29 | Disposition: A | Discharge status: Home / Self Care | Payer: No Typology Code available for payment source | Source: Ambulatory Visit | Attending: Radiation Oncology | Admitting: Radiation Oncology

## 2018-08-29 DIAGNOSIS — Z510 Encounter for antineoplastic radiation therapy: Secondary | ICD-10-CM | POA: Diagnosis not present

## 2018-08-30 ENCOUNTER — Ambulatory Visit
Admission: RE | Admit: 2018-08-30 | Discharge: 2018-08-30 | Disposition: A | Discharge status: Home / Self Care | Payer: No Typology Code available for payment source | Source: Ambulatory Visit | Attending: Radiation Oncology | Admitting: Radiation Oncology

## 2018-08-30 ENCOUNTER — Other Ambulatory Visit: Payer: Self-pay

## 2018-08-30 DIAGNOSIS — Z510 Encounter for antineoplastic radiation therapy: Secondary | ICD-10-CM | POA: Diagnosis not present

## 2018-09-02 ENCOUNTER — Other Ambulatory Visit: Payer: Self-pay

## 2018-09-02 ENCOUNTER — Ambulatory Visit
Admission: RE | Admit: 2018-09-02 | Discharge: 2018-09-02 | Disposition: A | Discharge status: Home / Self Care | Payer: No Typology Code available for payment source | Source: Ambulatory Visit | Attending: Radiation Oncology | Admitting: Radiation Oncology

## 2018-09-02 DIAGNOSIS — C50312 Malignant neoplasm of lower-inner quadrant of left female breast: Secondary | ICD-10-CM | POA: Insufficient documentation

## 2018-09-02 DIAGNOSIS — Z171 Estrogen receptor negative status [ER-]: Secondary | ICD-10-CM | POA: Insufficient documentation

## 2018-09-02 DIAGNOSIS — Z510 Encounter for antineoplastic radiation therapy: Secondary | ICD-10-CM | POA: Insufficient documentation

## 2018-09-03 ENCOUNTER — Ambulatory Visit
Admission: RE | Admit: 2018-09-03 | Discharge: 2018-09-03 | Disposition: A | Discharge status: Home / Self Care | Payer: No Typology Code available for payment source | Source: Ambulatory Visit | Attending: Radiation Oncology | Admitting: Radiation Oncology

## 2018-09-03 ENCOUNTER — Other Ambulatory Visit: Payer: Self-pay

## 2018-09-03 DIAGNOSIS — C50312 Malignant neoplasm of lower-inner quadrant of left female breast: Secondary | ICD-10-CM | POA: Diagnosis not present

## 2018-09-04 ENCOUNTER — Ambulatory Visit (INDEPENDENT_AMBULATORY_CARE_PROVIDER_SITE_OTHER): Payer: No Typology Code available for payment source | Admitting: Surgery

## 2018-09-04 ENCOUNTER — Encounter: Payer: Self-pay | Admitting: Surgery

## 2018-09-04 ENCOUNTER — Ambulatory Visit
Admission: RE | Admit: 2018-09-04 | Discharge: 2018-09-04 | Disposition: A | Discharge status: Home / Self Care | Payer: No Typology Code available for payment source | Source: Ambulatory Visit | Attending: Radiation Oncology | Admitting: Radiation Oncology

## 2018-09-04 ENCOUNTER — Other Ambulatory Visit: Payer: Self-pay

## 2018-09-04 VITALS — BP 138/88 | HR 86 | Temp 97.7°F | Ht 66.0 in | Wt 143.8 lb

## 2018-09-04 DIAGNOSIS — Z09 Encounter for follow-up examination after completed treatment for conditions other than malignant neoplasm: Secondary | ICD-10-CM

## 2018-09-04 DIAGNOSIS — C50312 Malignant neoplasm of lower-inner quadrant of left female breast: Secondary | ICD-10-CM | POA: Diagnosis not present

## 2018-09-04 NOTE — Progress Notes (Signed)
Shelby [name] is a 48 year old female with diagnosed triple negative left breast cancer status post neoadjuvant chemotherapy followed by lumpectomy with axillary dissection by Dr. Bary Castilla.  She did have positive margins requiring reexcision.  She has received adjuvant radiation therapy.  She is doing well and complains of some intermittent left breast pain.  No fevers no chills. She does report some left inner arm numbness.  No evidence of lymphedema.    PE No acute distress.  Awake and alert. Breast: Lumpectomy site healed no evidence of infection no evidence of abscess.  Axillary wound healing well.  No evidence of lymphedema no evidence of recurrence disease.  A/P doing well after lumpectomy and reexcision with axillary dissection I will follow her in about 2 months for another wound check.  No obvious surgical complications at this time

## 2018-09-04 NOTE — Patient Instructions (Addendum)
The patient is aware to call back for any questions or new concerns. Follow up in 2 months

## 2018-09-05 ENCOUNTER — Other Ambulatory Visit: Payer: Self-pay

## 2018-09-05 ENCOUNTER — Ambulatory Visit: Payer: No Typology Code available for payment source | Admitting: General Surgery

## 2018-09-05 ENCOUNTER — Ambulatory Visit
Admission: RE | Admit: 2018-09-05 | Discharge: 2018-09-05 | Disposition: A | Discharge status: Home / Self Care | Payer: No Typology Code available for payment source | Source: Ambulatory Visit | Attending: Radiation Oncology | Admitting: Radiation Oncology

## 2018-09-05 DIAGNOSIS — C50312 Malignant neoplasm of lower-inner quadrant of left female breast: Secondary | ICD-10-CM | POA: Diagnosis not present

## 2018-09-06 ENCOUNTER — Ambulatory Visit
Admission: RE | Admit: 2018-09-06 | Discharge: 2018-09-06 | Disposition: A | Discharge status: Home / Self Care | Payer: No Typology Code available for payment source | Source: Ambulatory Visit | Attending: Radiation Oncology | Admitting: Radiation Oncology

## 2018-09-06 ENCOUNTER — Other Ambulatory Visit: Payer: Self-pay

## 2018-09-06 DIAGNOSIS — C50312 Malignant neoplasm of lower-inner quadrant of left female breast: Secondary | ICD-10-CM | POA: Diagnosis not present

## 2018-09-06 MED FILL — LOSARTAN POTASSIUM 50 MG TA: 50 | 30 days supply | Qty: 30 | Fill #1

## 2018-09-09 ENCOUNTER — Ambulatory Visit
Admission: RE | Admit: 2018-09-09 | Discharge: 2018-09-09 | Disposition: A | Discharge status: Home / Self Care | Payer: No Typology Code available for payment source | Source: Ambulatory Visit | Attending: Radiation Oncology | Admitting: Radiation Oncology

## 2018-09-09 ENCOUNTER — Other Ambulatory Visit: Payer: Self-pay

## 2018-09-09 DIAGNOSIS — C50312 Malignant neoplasm of lower-inner quadrant of left female breast: Secondary | ICD-10-CM | POA: Diagnosis not present

## 2018-09-10 ENCOUNTER — Other Ambulatory Visit: Payer: Self-pay

## 2018-09-10 ENCOUNTER — Ambulatory Visit
Admission: RE | Admit: 2018-09-10 | Discharge: 2018-09-10 | Disposition: A | Discharge status: Home / Self Care | Payer: No Typology Code available for payment source | Source: Ambulatory Visit | Attending: Radiation Oncology | Admitting: Radiation Oncology

## 2018-09-10 DIAGNOSIS — C50312 Malignant neoplasm of lower-inner quadrant of left female breast: Secondary | ICD-10-CM | POA: Diagnosis not present

## 2018-09-11 ENCOUNTER — Ambulatory Visit
Admission: RE | Admit: 2018-09-11 | Discharge: 2018-09-11 | Disposition: A | Discharge status: Home / Self Care | Payer: No Typology Code available for payment source | Source: Ambulatory Visit | Attending: Radiation Oncology | Admitting: Radiation Oncology

## 2018-09-11 ENCOUNTER — Inpatient Hospital Stay: Payer: No Typology Code available for payment source | Attending: Radiation Oncology

## 2018-09-11 ENCOUNTER — Other Ambulatory Visit: Payer: Self-pay

## 2018-09-11 DIAGNOSIS — C50312 Malignant neoplasm of lower-inner quadrant of left female breast: Secondary | ICD-10-CM | POA: Diagnosis not present

## 2018-09-11 LAB — CBC
HCT: 38.6 % (ref 36.0–46.0)
Hemoglobin: 13.1 g/dL (ref 12.0–15.0)
MCH: 31.3 pg (ref 26.0–34.0)
MCHC: 33.9 g/dL (ref 30.0–36.0)
MCV: 92.3 fL (ref 80.0–100.0)
Platelets: 158 10*3/uL (ref 150–400)
RBC: 4.18 MIL/uL (ref 3.87–5.11)
RDW: 11.3 % — ABNORMAL LOW (ref 11.5–15.5)
WBC: 2.8 10*3/uL — ABNORMAL LOW (ref 4.0–10.5)
nRBC: 0 % (ref 0.0–0.2)

## 2018-09-12 ENCOUNTER — Other Ambulatory Visit: Payer: Self-pay

## 2018-09-12 ENCOUNTER — Ambulatory Visit
Admission: RE | Admit: 2018-09-12 | Discharge: 2018-09-12 | Disposition: A | Discharge status: Home / Self Care | Payer: No Typology Code available for payment source | Source: Ambulatory Visit | Attending: Radiation Oncology | Admitting: Radiation Oncology

## 2018-09-12 DIAGNOSIS — C50312 Malignant neoplasm of lower-inner quadrant of left female breast: Secondary | ICD-10-CM | POA: Diagnosis not present

## 2018-09-13 ENCOUNTER — Other Ambulatory Visit: Payer: Self-pay

## 2018-09-13 ENCOUNTER — Ambulatory Visit
Admission: RE | Admit: 2018-09-13 | Discharge: 2018-09-13 | Disposition: A | Discharge status: Home / Self Care | Payer: No Typology Code available for payment source | Source: Ambulatory Visit | Attending: Radiation Oncology | Admitting: Radiation Oncology

## 2018-09-13 DIAGNOSIS — C50312 Malignant neoplasm of lower-inner quadrant of left female breast: Secondary | ICD-10-CM | POA: Diagnosis not present

## 2018-09-16 ENCOUNTER — Other Ambulatory Visit: Payer: Self-pay

## 2018-09-16 ENCOUNTER — Ambulatory Visit
Admission: RE | Admit: 2018-09-16 | Discharge: 2018-09-16 | Disposition: A | Discharge status: Home / Self Care | Payer: No Typology Code available for payment source | Source: Ambulatory Visit | Attending: Radiation Oncology | Admitting: Radiation Oncology

## 2018-09-16 DIAGNOSIS — C50312 Malignant neoplasm of lower-inner quadrant of left female breast: Secondary | ICD-10-CM | POA: Diagnosis not present

## 2018-09-17 ENCOUNTER — Encounter: Payer: Self-pay | Admitting: Oncology

## 2018-09-17 ENCOUNTER — Ambulatory Visit
Admission: RE | Admit: 2018-09-17 | Discharge: 2018-09-17 | Disposition: A | Discharge status: Home / Self Care | Payer: No Typology Code available for payment source | Source: Ambulatory Visit | Attending: Radiation Oncology | Admitting: Radiation Oncology

## 2018-09-17 ENCOUNTER — Telehealth: Payer: Self-pay | Admitting: Family Medicine

## 2018-09-17 ENCOUNTER — Other Ambulatory Visit: Payer: Self-pay

## 2018-09-17 DIAGNOSIS — C50312 Malignant neoplasm of lower-inner quadrant of left female breast: Secondary | ICD-10-CM | POA: Diagnosis not present

## 2018-09-17 NOTE — Telephone Encounter (Signed)
Will send patient a message regarding this. I cant find it either.

## 2018-09-17 NOTE — Telephone Encounter (Signed)
Pt calling to check on the referral letter she typed up for her insurance. Wanting to know if Dr. [name] has signed it.  Please call Shelby [name] back at [phone].  Thanks, [employer]

## 2018-09-17 NOTE — Telephone Encounter (Signed)
Please review. Thanks!  

## 2018-09-17 NOTE — Telephone Encounter (Signed)
Have not seen it to my knowledge.

## 2018-09-18 ENCOUNTER — Other Ambulatory Visit: Payer: Self-pay

## 2018-09-18 ENCOUNTER — Ambulatory Visit
Admission: RE | Admit: 2018-09-18 | Discharge: 2018-09-18 | Disposition: A | Discharge status: Home / Self Care | Payer: No Typology Code available for payment source | Source: Ambulatory Visit | Attending: Radiation Oncology | Admitting: Radiation Oncology

## 2018-09-18 DIAGNOSIS — C50312 Malignant neoplasm of lower-inner quadrant of left female breast: Secondary | ICD-10-CM | POA: Diagnosis not present

## 2018-09-19 ENCOUNTER — Other Ambulatory Visit: Payer: Self-pay

## 2018-09-19 ENCOUNTER — Ambulatory Visit
Admission: RE | Admit: 2018-09-19 | Discharge: 2018-09-19 | Disposition: A | Discharge status: Home / Self Care | Payer: No Typology Code available for payment source | Source: Ambulatory Visit | Attending: Radiation Oncology | Admitting: Radiation Oncology

## 2018-09-19 DIAGNOSIS — C50312 Malignant neoplasm of lower-inner quadrant of left female breast: Secondary | ICD-10-CM | POA: Diagnosis not present

## 2018-09-20 ENCOUNTER — Ambulatory Visit
Admission: RE | Admit: 2018-09-20 | Discharge: 2018-09-20 | Disposition: A | Discharge status: Home / Self Care | Payer: No Typology Code available for payment source | Source: Ambulatory Visit | Attending: Radiation Oncology | Admitting: Radiation Oncology

## 2018-09-20 ENCOUNTER — Other Ambulatory Visit: Payer: Self-pay

## 2018-09-20 DIAGNOSIS — C50312 Malignant neoplasm of lower-inner quadrant of left female breast: Secondary | ICD-10-CM | POA: Diagnosis not present

## 2018-09-23 ENCOUNTER — Ambulatory Visit
Admission: RE | Admit: 2018-09-23 | Discharge: 2018-09-23 | Disposition: A | Discharge status: Home / Self Care | Payer: No Typology Code available for payment source | Source: Ambulatory Visit | Attending: Radiation Oncology | Admitting: Radiation Oncology

## 2018-09-23 ENCOUNTER — Other Ambulatory Visit: Payer: Self-pay

## 2018-09-23 DIAGNOSIS — C50312 Malignant neoplasm of lower-inner quadrant of left female breast: Secondary | ICD-10-CM | POA: Diagnosis not present

## 2018-09-24 ENCOUNTER — Other Ambulatory Visit: Payer: Self-pay

## 2018-09-24 ENCOUNTER — Ambulatory Visit
Admission: RE | Admit: 2018-09-24 | Discharge: 2018-09-24 | Disposition: A | Discharge status: Home / Self Care | Payer: No Typology Code available for payment source | Source: Ambulatory Visit | Attending: Radiation Oncology | Admitting: Radiation Oncology

## 2018-09-24 DIAGNOSIS — C50312 Malignant neoplasm of lower-inner quadrant of left female breast: Secondary | ICD-10-CM | POA: Diagnosis not present

## 2018-09-25 ENCOUNTER — Other Ambulatory Visit: Payer: Self-pay

## 2018-09-25 ENCOUNTER — Ambulatory Visit
Admission: RE | Admit: 2018-09-25 | Discharge: 2018-09-25 | Disposition: A | Discharge status: Home / Self Care | Payer: No Typology Code available for payment source | Source: Ambulatory Visit | Attending: Radiation Oncology | Admitting: Radiation Oncology

## 2018-09-25 ENCOUNTER — Inpatient Hospital Stay: Payer: No Typology Code available for payment source

## 2018-09-25 DIAGNOSIS — C50312 Malignant neoplasm of lower-inner quadrant of left female breast: Secondary | ICD-10-CM | POA: Diagnosis not present

## 2018-09-25 DIAGNOSIS — Z510 Encounter for antineoplastic radiation therapy: Secondary | ICD-10-CM | POA: Diagnosis not present

## 2018-09-26 ENCOUNTER — Other Ambulatory Visit: Payer: Self-pay

## 2018-09-26 ENCOUNTER — Ambulatory Visit
Admission: RE | Admit: 2018-09-26 | Discharge: 2018-09-26 | Disposition: A | Discharge status: Home / Self Care | Payer: No Typology Code available for payment source | Source: Ambulatory Visit | Attending: Radiation Oncology | Admitting: Radiation Oncology

## 2018-09-26 DIAGNOSIS — C50312 Malignant neoplasm of lower-inner quadrant of left female breast: Secondary | ICD-10-CM | POA: Diagnosis not present

## 2018-09-27 ENCOUNTER — Other Ambulatory Visit: Payer: Self-pay

## 2018-09-27 ENCOUNTER — Ambulatory Visit
Admission: RE | Admit: 2018-09-27 | Discharge: 2018-09-27 | Disposition: A | Discharge status: Home / Self Care | Payer: No Typology Code available for payment source | Source: Ambulatory Visit | Attending: Radiation Oncology | Admitting: Radiation Oncology

## 2018-09-27 DIAGNOSIS — C50312 Malignant neoplasm of lower-inner quadrant of left female breast: Secondary | ICD-10-CM | POA: Diagnosis not present

## 2018-09-30 ENCOUNTER — Ambulatory Visit
Admission: RE | Admit: 2018-09-30 | Discharge: 2018-09-30 | Disposition: A | Discharge status: Home / Self Care | Payer: No Typology Code available for payment source | Source: Ambulatory Visit | Attending: Radiation Oncology | Admitting: Radiation Oncology

## 2018-09-30 ENCOUNTER — Other Ambulatory Visit: Payer: Self-pay

## 2018-09-30 DIAGNOSIS — C50312 Malignant neoplasm of lower-inner quadrant of left female breast: Secondary | ICD-10-CM | POA: Diagnosis not present

## 2018-10-01 ENCOUNTER — Ambulatory Visit
Admission: RE | Admit: 2018-10-01 | Discharge: 2018-10-01 | Disposition: A | Discharge status: Home / Self Care | Payer: No Typology Code available for payment source | Source: Ambulatory Visit | Attending: Radiation Oncology | Admitting: Radiation Oncology

## 2018-10-01 ENCOUNTER — Other Ambulatory Visit: Payer: Self-pay

## 2018-10-01 DIAGNOSIS — C50312 Malignant neoplasm of lower-inner quadrant of left female breast: Secondary | ICD-10-CM | POA: Diagnosis present

## 2018-10-01 DIAGNOSIS — Z803 Family history of malignant neoplasm of breast: Secondary | ICD-10-CM | POA: Diagnosis not present

## 2018-10-01 DIAGNOSIS — Z79899 Other long term (current) drug therapy: Secondary | ICD-10-CM | POA: Diagnosis not present

## 2018-10-01 DIAGNOSIS — Z171 Estrogen receptor negative status [ER-]: Secondary | ICD-10-CM | POA: Insufficient documentation

## 2018-10-01 DIAGNOSIS — Z833 Family history of diabetes mellitus: Secondary | ICD-10-CM | POA: Diagnosis not present

## 2018-10-01 DIAGNOSIS — Z800 Family history of malignant neoplasm of digestive organs: Secondary | ICD-10-CM | POA: Diagnosis not present

## 2018-10-01 DIAGNOSIS — Z510 Encounter for antineoplastic radiation therapy: Secondary | ICD-10-CM | POA: Insufficient documentation

## 2018-10-01 DIAGNOSIS — R5383 Other fatigue: Secondary | ICD-10-CM | POA: Diagnosis not present

## 2018-10-01 DIAGNOSIS — Z87891 Personal history of nicotine dependence: Secondary | ICD-10-CM | POA: Diagnosis not present

## 2018-10-01 DIAGNOSIS — F419 Anxiety disorder, unspecified: Secondary | ICD-10-CM | POA: Diagnosis not present

## 2018-10-02 ENCOUNTER — Other Ambulatory Visit: Payer: Self-pay

## 2018-10-02 ENCOUNTER — Ambulatory Visit
Admission: RE | Admit: 2018-10-02 | Discharge: 2018-10-02 | Disposition: A | Discharge status: Home / Self Care | Payer: No Typology Code available for payment source | Source: Ambulatory Visit | Attending: Radiation Oncology | Admitting: Radiation Oncology

## 2018-10-02 DIAGNOSIS — Z510 Encounter for antineoplastic radiation therapy: Secondary | ICD-10-CM | POA: Diagnosis not present

## 2018-10-03 ENCOUNTER — Ambulatory Visit
Admission: RE | Admit: 2018-10-03 | Discharge: 2018-10-03 | Disposition: A | Discharge status: Home / Self Care | Payer: No Typology Code available for payment source | Source: Ambulatory Visit | Attending: Radiation Oncology | Admitting: Radiation Oncology

## 2018-10-03 ENCOUNTER — Other Ambulatory Visit: Payer: Self-pay

## 2018-10-03 DIAGNOSIS — Z510 Encounter for antineoplastic radiation therapy: Secondary | ICD-10-CM | POA: Diagnosis not present

## 2018-10-04 ENCOUNTER — Other Ambulatory Visit: Payer: Self-pay

## 2018-10-04 ENCOUNTER — Ambulatory Visit
Admission: RE | Admit: 2018-10-04 | Discharge: 2018-10-04 | Disposition: A | Discharge status: Home / Self Care | Payer: No Typology Code available for payment source | Source: Ambulatory Visit | Attending: Radiation Oncology | Admitting: Radiation Oncology

## 2018-10-04 DIAGNOSIS — Z510 Encounter for antineoplastic radiation therapy: Secondary | ICD-10-CM | POA: Diagnosis not present

## 2018-10-04 NOTE — Progress Notes (Signed)
[facility]  [phone] [phone]) [phone]  ID: Carolan Clines OB: [birth year]  MR#: [id number]  JSE#:[id number]  Patient Care Team: Jerrol Banana., MD as PCP - General (Family Medicine) [name], MD as Consulting Physician (Cardiology)  CHIEF COMPLAINT: Pathologic stage IIa triple negative invasive carcinoma of the lower inner quadrant of the left breast.  INTERVAL HISTORY: Patient returns to clinic today for further evaluation and discussion of initiating gemcitabine.  She has had profound fatigue throughout her XRT, but otherwise is tolerating it well.  She has approximately 1 week left of treatment.  She denies any recent fevers or illnesses.  She has no neurologic complaints.  She has a good appetite and denies weight loss.  She denies any chest pain, shortness of breath, cough, or hemoptysis.  She denies any nausea, vomiting, constipation, or diarrhea.  She has no urinary complaints.  Patient offers no further specific complaints today.    REVIEW OF SYSTEMS:   Review of Systems  Constitutional: Positive for malaise/fatigue. Negative for fever and weight loss.  HENT: Negative.  Negative for congestion, ear pain and sore throat.   Respiratory: Negative.  Negative for cough, hemoptysis and shortness of breath.   Cardiovascular: Negative.  Negative for chest pain and leg swelling.  Gastrointestinal: Negative.  Negative for abdominal pain and melena.  Genitourinary: Negative.  Negative for dysuria.  Musculoskeletal: Negative.  Negative for back pain.  Skin: Negative.  Negative for rash.  Neurological: Positive for weakness. Negative for dizziness, focal weakness and headaches.  Psychiatric/Behavioral: The patient is nervous/anxious.     As per HPI. Otherwise, a complete review of systems is negative.  PAST MEDICAL HISTORY: Past Medical History:  Diagnosis Date  . Anemia    h/o with pregnancy  . Anxiety   . Asthma    allergy  induced-no inhaler  . Cancer [facility])    breast left   . Complication of anesthesia   . Environmental allergies   . Family history of adverse reaction to anesthesia    son-breathing problems-coded 1st time when he was 2 and had another surgery at 49 and had to be admitted for breathing problems  . Family history of breast cancer   . GERD (gastroesophageal reflux disease)   . Headache    migraines  . Hypertension   . Mitral valve disease   . Mitral valve prolapse   . Painful menstrual periods   . PONV (postoperative nausea and vomiting)   . Vertigo     PAST SURGICAL HISTORY: Past Surgical History:  Procedure Laterality Date  . ABDOMINAL HYSTERECTOMY  2010   supracervical   . AXILLARY LYMPH NODE DISSECTION Left 07/12/2018   Procedure: AXILLARY LYMPH NODE DISSECTION;  Surgeon: Robert Bellow, MD;  Location: [facility] OR[facility];  Service: General;  Laterality: Left;  . BREAST BIOPSY Left 11/2017   invasive ductal carcinoma and metastatic LN  . BREAST BIOPSY WITH SENTINEL LYMPH NODE BIOPSY AND NEEDLE LOCALIZATION Left 07/12/2018   Procedure: BREAST BIOPSY WIDE EXCISION WITH SENTINEL NODE  AND NEEDLE LOCALIZATION OF OXILLARY NODS LEFT;  Surgeon: Robert Bellow, MD;  Location: [facility] OR[facility];  Service: General;  Laterality: Left;  . BUNIONECTOMY Bilateral   . MANDIBLE RECONSTRUCTION     age 33-underbite  . PORTACATH PLACEMENT Right 12/31/2017   Procedure: INSERTION PORT-A-CATH;  Surgeon: Robert Bellow, MD;  Location: [facility] OR[facility];  Service: General;  Laterality: Right;  . RE-EXCISION OF BREAST LUMPECTOMY Left 07/24/2018   Procedure: RE-EXCISION  OF BREAST LUMPECTOMY LEFT;  Surgeon: Robert Bellow, MD;  Location: [facility];  Service: General;  Laterality: Left;  . SHOULDER ARTHROSCOPY WITH OPEN ROTATOR CUFF REPAIR Right 04/26/2017   Procedure: SHOULDER ARTHROSCOPY WITH OPEN ROTATOR CUFF REPAIR,SUBACROMINAL DECOMPRESSION;  Surgeon: Thornton Park, MD;  Location: [facility] ORS;  Service: Orthopedics;   Laterality: Right;  . TONSILLECTOMY      FAMILY HISTORY: Family History  Problem Relation Age of Onset  . Breast cancer Mother 42  . Diabetes Father   . Cancer Maternal Uncle        colon  . Breast cancer Maternal Grandmother 22    ADVANCED DIRECTIVES (Y/N):  N  HEALTH MAINTENANCE: Social History   Tobacco Use  . Smoking status: Former Smoker    Packs/day: 0.50    Years: 10.00    Pack years: 5.00    Types: Cigarettes    Quit date: 04/20/2007    Years since quitting: 11.4  . Smokeless tobacco: Never Used  . Tobacco comment: social smoker back then  Substance Use Topics  . Alcohol use: Yes    Comment: occas  . Drug use: No     Colonoscopy:  PAP:  Bone density:  Lipid panel:  Allergies  Allergen Reactions  . Hydrocodone Hives    Swollen face - many years ago. Thinks she has tolerated since.  . Iodine Hives  . Peanut Butter Flavor Other (See Comments)    Made mouth tingle  . Shellfish Allergy Hives    Current Outpatient Medications  Medication Sig Dispense Refill  . Cholecalciferol (VITAMIN D) 50 MCG (2000 UT) CAPS Take 2,000 Units by mouth daily.    Marland Kitchen EPINEPHrine 0.3 mg/0.3 mL IJ SOAJ injection Inject 0.3 mLs (0.3 mg total) into the muscle once. (Patient taking differently: Inject 0.3 mg into the muscle daily as needed for anaphylaxis. ) 1 Device 12  . famotidine (PEPCID) 20 MG tablet Take 20 mg by mouth daily.   0  . ibuprofen (ADVIL,MOTRIN) 200 MG tablet Take 200 mg by mouth daily as needed for mild pain.     Marland Kitchen lidocaine-prilocaine (EMLA) cream Apply to affected area once (Patient taking differently: Apply 1 application topically daily as needed (Prior to porta-cath access). ) 30 g 3  . losartan (COZAAR) 50 MG tablet TAKE 1 TABLET BY MOUTH DAILY (Patient taking differently: Take 50 mg by mouth at bedtime. ) 90 tablet 0  . mometasone (NASONEX) 50 MCG/ACT nasal spray Place 2 sprays into the nose daily. (Patient taking differently: Place 2 sprays into the nose  daily as needed (allergies). ) 17 g 12  . montelukast (SINGULAIR) 10 MG tablet TAKE 1 TABLET BY MOUTH AT BEDTIME (Patient taking differently: Take 10 mg by mouth daily as needed (allergies). ) 30 tablet 12  . Omega-3 1000 MG CAPS Take by mouth daily.    . ondansetron (ZOFRAN) 8 MG tablet Take 1 tablet (8 mg total) by mouth 2 (two) times daily as needed. 30 tablet 2  . prochlorperazine (COMPAZINE) 10 MG tablet Take 1 tablet (10 mg total) by mouth every 6 (six) hours as needed (Nausea or vomiting). 60 tablet 2  . ALPRAZolam (XANAX) 0.5 MG tablet Take 0.5-1 tablets (0.25-0.5 mg total) by mouth 2 (two) times daily as needed for anxiety. (Patient not taking: Reported on 10/08/2018) 60 tablet 0   No current facility-administered medications for this visit.     OBJECTIVE: Vitals:   10/09/18 1117  BP: (!) 151/86  Pulse: 91  Temp:  97.9 F (36.6 C)     Body mass index is 23.57 kg/m.    ECOG FS:0 - Asymptomatic  General: Well-developed, well-nourished, no acute distress. Eyes: Pink conjunctiva, anicteric sclera. HEENT: Normocephalic, moist mucous membranes. Breast: Left breast with erythematous skin secondary to XRT.  Mildly tender to palpation. Lungs: Clear to auscultation bilaterally. Heart: Regular rate and rhythm. No rubs, murmurs, or gallops. Abdomen: Soft, nontender, nondistended. No organomegaly noted, normoactive bowel sounds. Musculoskeletal: No edema, cyanosis, or clubbing. Neuro: Alert, answering all questions appropriately. Cranial nerves grossly intact. Skin: No rashes or petechiae noted. Psych: Normal affect.  LAB RESULTS:  Lab Results  Component Value Date   NA 138 10/09/2018   K 4.0 10/09/2018   CL 101 10/09/2018   CO2 24 10/09/2018   GLUCOSE 112 (H) 10/09/2018   BUN 20 10/09/2018   CREATININE 0.59 10/09/2018   CALCIUM 10.1 10/09/2018   PROT 7.2 10/09/2018   ALBUMIN 4.5 10/09/2018   AST 26 10/09/2018   ALT 22 10/09/2018   ALKPHOS 55 10/09/2018   BILITOT 0.6  10/09/2018   GFRNONAA >60 10/09/2018   GFRAA >60 10/09/2018    Lab Results  Component Value Date   WBC 4.9 10/09/2018   NEUTROABS 3.5 10/09/2018   HGB 13.2 10/09/2018   HCT 38.5 10/09/2018   MCV 89.1 10/09/2018   PLT 175 10/09/2018     STUDIES: No results found.  ASSESSMENT: Pathologic stage IIa triple negative invasive carcinoma of the lower inner quadrant of the left breast.  PLAN:    1.  Pathologic stage IIa triple negative invasive carcinoma of the lower inner quadrant of the left breast: CT scan results from January 01, 2018 did not reveal any metastatic disease.  Patient completed her neoadjuvant chemotherapy with Adriamycin and Cytoxan, followed by carboplatinum and Taxol, on Jun 20, 2018.  Her initial resection was completed on July 12, 2018 and then she underwent reexcision for positive margins on July 24, 2018.  Patient is near the completion of adjuvant XRT with her last treatment scheduled for October 16, 2018.  She wishes to pursue adjuvant capecitabine 1250 mg/m twice daily for 14 days with 7 days off.  Plan to do 6-8 cycles depending on toxicity.  Patient will initiate treatment on November 04, 2018 and then return to clinic 3 weeks later on November 25, 2018 for further evaluation and consideration of cycle 2.   2.  Anxiety: Chronic.  Continue Xanax as needed. 3.  Anemia: Resolved. 3.  Leukopenia: Resolved.   I spent a total of 30 minutes face-to-face with the patient of which greater than 50% of the visit was spent in counseling and coordination of care as detailed above.   Patient expressed understanding and was in agreement with this plan. She also understands that She can call clinic at any time with any questions, concerns, or complaints.   Cancer Staging Primary cancer of lower-inner quadrant of left female breast [facility]) Staging form: Breast, AJCC 8th Edition - Clinical stage from 12/24/2017: Stage IIIB (cT2, cN1, cM0, G3, ER-, PR-, HER2-) - Signed by  Lloyd Huger, MD on 12/24/2017 - Pathologic stage from 08/01/2018: No Stage Recommended (ypT1c, pN1a, cM0, ER-, PR-, HER2-) - Signed by Lloyd Huger, MD on 08/01/2018   Lloyd Huger, MD   10/09/2018 4:06 PM

## 2018-10-08 ENCOUNTER — Ambulatory Visit
Admission: RE | Admit: 2018-10-08 | Discharge: 2018-10-08 | Disposition: A | Discharge status: Home / Self Care | Payer: No Typology Code available for payment source | Source: Ambulatory Visit | Attending: Radiation Oncology | Admitting: Radiation Oncology

## 2018-10-08 ENCOUNTER — Other Ambulatory Visit: Payer: Self-pay

## 2018-10-08 DIAGNOSIS — Z510 Encounter for antineoplastic radiation therapy: Secondary | ICD-10-CM | POA: Diagnosis not present

## 2018-10-08 MED FILL — LOSARTAN POTASSIUM 50 MG TA: 50 | 30 days supply | Qty: 30 | Fill #2

## 2018-10-08 NOTE — Progress Notes (Signed)
Pre screening completed. No changes since last appt.

## 2018-10-09 ENCOUNTER — Other Ambulatory Visit: Payer: Self-pay

## 2018-10-09 ENCOUNTER — Inpatient Hospital Stay: Payer: No Typology Code available for payment source | Attending: Oncology

## 2018-10-09 ENCOUNTER — Ambulatory Visit
Admission: RE | Admit: 2018-10-09 | Discharge: 2018-10-09 | Disposition: A | Discharge status: Home / Self Care | Payer: No Typology Code available for payment source | Source: Ambulatory Visit | Attending: Radiation Oncology | Admitting: Radiation Oncology

## 2018-10-09 ENCOUNTER — Inpatient Hospital Stay (HOSPITAL_BASED_OUTPATIENT_CLINIC_OR_DEPARTMENT_OTHER): Payer: No Typology Code available for payment source | Admitting: Oncology

## 2018-10-09 VITALS — BP 151/86 | HR 91 | Temp 97.9°F | Wt 146.0 lb

## 2018-10-09 DIAGNOSIS — Z510 Encounter for antineoplastic radiation therapy: Secondary | ICD-10-CM | POA: Diagnosis not present

## 2018-10-09 DIAGNOSIS — F419 Anxiety disorder, unspecified: Secondary | ICD-10-CM | POA: Insufficient documentation

## 2018-10-09 DIAGNOSIS — R5383 Other fatigue: Secondary | ICD-10-CM | POA: Insufficient documentation

## 2018-10-09 DIAGNOSIS — Z79899 Other long term (current) drug therapy: Secondary | ICD-10-CM | POA: Insufficient documentation

## 2018-10-09 DIAGNOSIS — Z87891 Personal history of nicotine dependence: Secondary | ICD-10-CM | POA: Insufficient documentation

## 2018-10-09 DIAGNOSIS — C50312 Malignant neoplasm of lower-inner quadrant of left female breast: Secondary | ICD-10-CM

## 2018-10-09 DIAGNOSIS — Z833 Family history of diabetes mellitus: Secondary | ICD-10-CM | POA: Insufficient documentation

## 2018-10-09 DIAGNOSIS — Z800 Family history of malignant neoplasm of digestive organs: Secondary | ICD-10-CM | POA: Insufficient documentation

## 2018-10-09 DIAGNOSIS — Z95828 Presence of other vascular implants and grafts: Secondary | ICD-10-CM

## 2018-10-09 DIAGNOSIS — Z803 Family history of malignant neoplasm of breast: Secondary | ICD-10-CM | POA: Insufficient documentation

## 2018-10-09 DIAGNOSIS — Z171 Estrogen receptor negative status [ER-]: Secondary | ICD-10-CM | POA: Insufficient documentation

## 2018-10-09 LAB — COMPREHENSIVE METABOLIC PANEL
ALT: 22 U/L (ref 0–44)
AST: 26 U/L (ref 15–41)
Albumin: 4.5 g/dL (ref 3.5–5.0)
Alkaline Phosphatase: 55 U/L (ref 38–126)
Anion gap: 13 (ref 5–15)
BUN: 20 mg/dL (ref 6–20)
CO2: 24 mmol/L (ref 22–32)
Calcium: 10.1 mg/dL (ref 8.9–10.3)
Chloride: 101 mmol/L (ref 98–111)
Creatinine, Ser: 0.59 mg/dL (ref 0.44–1.00)
GFR calc Af Amer: >60 mL/min (ref >60)
GFR calc non Af Amer: >60 mL/min (ref >60)
Glucose, Bld: 112 mg/dL — ABNORMAL HIGH (ref 70–99)
Potassium: 4 mmol/L (ref 3.5–5.1)
Sodium: 138 mmol/L (ref 135–145)
Total Bilirubin: 0.6 mg/dL (ref 0.3–1.2)
Total Protein: 7.2 g/dL (ref 6.5–8.1)

## 2018-10-09 LAB — CBC WITH DIFFERENTIAL/PLATELET
Abs Immature Granulocytes: 0.02 10*3/uL (ref 0.00–0.07)
Basophils Absolute: 0 10*3/uL (ref 0.0–0.1)
Basophils Relative: 0 %
Eosinophils Absolute: 0.1 10*3/uL (ref 0.0–0.5)
Eosinophils Relative: 2 %
HCT: 38.5 % (ref 36.0–46.0)
Hemoglobin: 13.2 g/dL (ref 12.0–15.0)
Immature Granulocytes: 0 %
Lymphocytes Relative: 17 %
Lymphs Abs: 0.8 10*3/uL (ref 0.7–4.0)
MCH: 30.6 pg (ref 26.0–34.0)
MCHC: 34.3 g/dL (ref 30.0–36.0)
MCV: 89.1 fL (ref 80.0–100.0)
Monocytes Absolute: 0.4 10*3/uL (ref 0.1–1.0)
Monocytes Relative: 7 %
Neutro Abs: 3.5 10*3/uL (ref 1.7–7.7)
Neutrophils Relative %: 74 %
Platelets: 175 10*3/uL (ref 150–400)
RBC: 4.32 MIL/uL (ref 3.87–5.11)
RDW: 11.7 % (ref 11.5–15.5)
WBC: 4.9 10*3/uL (ref 4.0–10.5)
nRBC: 0 % (ref 0.0–0.2)

## 2018-10-09 MED ORDER — CAPECITABINE 500 MG PO TABS: 1250.0000 mg/m2 | ORAL_TABLET | Freq: Two times a day (BID) | ORAL | 4 refills | Status: DC

## 2018-10-09 MED ORDER — CAPECITABINE 150 MG PO TABS: 1250.0000 mg/m2 | ORAL_TABLET | Freq: Two times a day (BID) | ORAL | 4 refills | Status: DC

## 2018-10-09 MED ORDER — HEPARIN SOD (PORK) LOCK FLUSH 100 UNIT/ML IV SOLN
500.0000 [IU] | Freq: Once | INTRAVENOUS | Status: AC
  Administered 2018-10-09: 11:00:00 500 [IU] via INTRAVENOUS
  Filled 2018-10-09: qty 5

## 2018-10-09 MED ORDER — SODIUM CHLORIDE 0.9% FLUSH
10.0000 mL | INTRAVENOUS | Status: DC | PRN
  Administered 2018-10-09: 11:00:00 10 mL via INTRAVENOUS
  Filled 2018-10-09: qty 10

## 2018-10-09 NOTE — Progress Notes (Signed)
DISCONTINUE ON PATHWAY REGIMEN - Breast     A cycle is every 14 days (cycles 1-4):     Doxorubicin      Cyclophosphamide      Pegfilgrastim-xxxx    A cycle is every 21 days (cycles 5-8):     Paclitaxel      Carboplatin   **Always confirm dose/schedule in your pharmacy ordering system**  REASON: Other Reason PRIOR TREATMENT: [id number]: Dose-Dense AC [Doxorubicin + Cyclophosphamide q14 Days x 4 Cycles], Followed by Paclitaxel 80 mg/m2 Weekly + Carboplatin AUC=6 q21 Days x 12 Weeks TREATMENT RESPONSE: Partial Response (PR)  START ON PATHWAY REGIMEN - Breast     A cycle is every 21 days:     Capecitabine   **Always confirm dose/schedule in your pharmacy ordering system**  Patient Characteristics: Post-Neoadjuvant Therapy and Resection, HER2 Negative/Unknown/Equivocal, ER Negative/Unknown, Residual Disease, Adjuvant Therapy - Residual Disease After Neoadjuvant Chemotherapy Therapeutic Status: Post-Neoadjuvant Therapy and Resection ER Status: Negative (-) HER2 Status: Negative (-) PR Status: Negative (-) Residual Invasive Disease Post-Neoadjuvant Therapy<= Yes Intent of Therapy: Curative Intent, Discussed with Patient

## 2018-10-10 ENCOUNTER — Other Ambulatory Visit: Payer: Self-pay

## 2018-10-10 ENCOUNTER — Ambulatory Visit
Admission: RE | Admit: 2018-10-10 | Discharge: 2018-10-10 | Disposition: A | Discharge status: Home / Self Care | Payer: No Typology Code available for payment source | Source: Ambulatory Visit | Attending: Radiation Oncology | Admitting: Radiation Oncology

## 2018-10-10 ENCOUNTER — Telehealth: Payer: Self-pay | Admitting: Pharmacy Technician

## 2018-10-10 ENCOUNTER — Telehealth: Payer: Self-pay | Admitting: Pharmacist

## 2018-10-10 DIAGNOSIS — C50312 Malignant neoplasm of lower-inner quadrant of left female breast: Secondary | ICD-10-CM

## 2018-10-10 DIAGNOSIS — Z510 Encounter for antineoplastic radiation therapy: Secondary | ICD-10-CM | POA: Diagnosis not present

## 2018-10-10 MED ORDER — CAPECITABINE 500 MG PO TABS: 2000.0000 mg | ORAL_TABLET | Freq: Two times a day (BID) | ORAL | 4 refills | Status: DC

## 2018-10-10 NOTE — Telephone Encounter (Signed)
Oral Oncology Patient Advocate Encounter  Received notification from [employer] that prior authorization for Xeloda is required.  PA submitted on CoverMyMeds Key [id number] Status is pending  Oral Oncology Clinic will continue to follow.  [place] Patient [place] Phone [phone] Fax [phone] 10/14/2018 8:15 AM

## 2018-10-10 NOTE — Telephone Encounter (Signed)
Oral Oncology Pharmacist Encounter  Received new prescription for Xeloda (capecitabine) for the adjuvant treatment of stage IIa triple triple negative breast cancer, planned duration 6-8 cycles.  CMP from 10/09/2018 assessed, no relevant lab abnormalities. Prescription dose and frequency assessed.   Current medication list in Epic reviewed, no DDIs with capecitabine identified.  Prescription has been e-scribed to the [facility] for benefits analysis and approval.  Oral Oncology Clinic will continue to follow for insurance authorization, copayment issues, initial counseling and start date.  Darl Pikes, PharmD, BCPS, [facility] Hematology/Oncology Clinical Pharmacist [facility] [place] [facility] [phone]  10/10/2018 9:23 AM

## 2018-10-11 ENCOUNTER — Ambulatory Visit
Admission: RE | Admit: 2018-10-11 | Discharge: 2018-10-11 | Disposition: A | Discharge status: Home / Self Care | Payer: No Typology Code available for payment source | Source: Ambulatory Visit | Attending: Radiation Oncology | Admitting: Radiation Oncology

## 2018-10-11 ENCOUNTER — Other Ambulatory Visit: Payer: Self-pay

## 2018-10-11 DIAGNOSIS — Z510 Encounter for antineoplastic radiation therapy: Secondary | ICD-10-CM | POA: Diagnosis not present

## 2018-10-14 ENCOUNTER — Encounter: Payer: Self-pay | Admitting: Family Medicine

## 2018-10-14 ENCOUNTER — Other Ambulatory Visit: Payer: Self-pay

## 2018-10-14 ENCOUNTER — Ambulatory Visit
Admission: RE | Admit: 2018-10-14 | Discharge: 2018-10-14 | Disposition: A | Discharge status: Home / Self Care | Payer: No Typology Code available for payment source | Source: Ambulatory Visit | Attending: Radiation Oncology | Admitting: Radiation Oncology

## 2018-10-14 DIAGNOSIS — Z510 Encounter for antineoplastic radiation therapy: Secondary | ICD-10-CM | POA: Diagnosis not present

## 2018-10-14 NOTE — Telephone Encounter (Signed)
Oral Oncology Patient Advocate Encounter  Prior Authorization for Xeloda (Capecitabine) 500mg  has been approved.    PA# [id number] Effective dates: 10/10/2018 through 10/09/2019 - max of 12 fills.  Patients co-pay is $15.00.  Prior auth approved for 140 tablets per 21 days.  Oral Oncology Clinic will continue to follow.   [facility] [place] Phone [phone] Fax [phone] 10/14/2018 8:18 AM

## 2018-10-15 ENCOUNTER — Ambulatory Visit
Admission: RE | Admit: 2018-10-15 | Discharge: 2018-10-15 | Disposition: A | Discharge status: Home / Self Care | Payer: No Typology Code available for payment source | Source: Ambulatory Visit | Attending: Radiation Oncology | Admitting: Radiation Oncology

## 2018-10-15 ENCOUNTER — Encounter: Payer: Self-pay | Admitting: Family Medicine

## 2018-10-15 ENCOUNTER — Ambulatory Visit (INDEPENDENT_AMBULATORY_CARE_PROVIDER_SITE_OTHER): Payer: No Typology Code available for payment source | Admitting: Family Medicine

## 2018-10-15 ENCOUNTER — Other Ambulatory Visit: Payer: Self-pay

## 2018-10-15 VITALS — BP 138/88 | HR 95 | Temp 97.3°F | Resp 16 | Wt 148.0 lb

## 2018-10-15 DIAGNOSIS — B001 Herpesviral vesicular dermatitis: Secondary | ICD-10-CM | POA: Diagnosis not present

## 2018-10-15 DIAGNOSIS — R591 Generalized enlarged lymph nodes: Secondary | ICD-10-CM | POA: Diagnosis not present

## 2018-10-15 DIAGNOSIS — Z510 Encounter for antineoplastic radiation therapy: Secondary | ICD-10-CM | POA: Diagnosis not present

## 2018-10-15 MED ORDER — CEPHALEXIN 500 MG PO CAPS: 500.0000 mg | ORAL_CAPSULE | Freq: Three times a day (TID) | ORAL | 0 refills | Status: AC

## 2018-10-15 MED ORDER — VALACYCLOVIR HCL 1 G PO TABS: 1000.0000 mg | ORAL_TABLET | Freq: Three times a day (TID) | ORAL | 0 refills | Status: AC

## 2018-10-15 MED FILL — LOSARTAN POTASSIUM 50 MG TA: 50 | 30 days supply | Qty: 30 | Fill #0

## 2018-10-15 MED FILL — CAPECITABINE 500 MG TABS: 500 | 21 days supply | Qty: 112 | Fill #0

## 2018-10-15 NOTE — Progress Notes (Signed)
[name] Female    DOB: 07-22-1970   48 y.o.   MRN: [id number] Visit Date: 10/15/2018  Today's Provider: Lelon Huh, MD   Chief Complaint  Patient presents with  . Facial Pain   Subjective:     HPI Facial pain: Patient complains of tingling and pain in her right eye and right ear since yesterday. She also complains of neck pain, swollen lymph glands and headaches on the left side.    Allergies  Allergen Reactions  . Hydrocodone Hives    Swollen face - many years ago. Thinks she has tolerated since.  . Iodine Hives  . Peanut Butter Flavor Other (See Comments)    Made mouth tingle  . Shellfish Allergy Hives     Current Outpatient Medications:  .  ALPRAZolam (XANAX) 0.5 MG tablet, Take 0.5-1 tablets (0.25-0.5 mg total) by mouth 2 (two) times daily as needed for anxiety., Disp: 60 tablet, Rfl: 0 .  capecitabine (XELODA) 500 MG tablet, Take 4 tablets (2,000 mg total) by mouth 2 (two) times daily after a meal. Take for 14 days, then hold for 7 days. Repeat every 21 days., Disp: 112 tablet, Rfl: 4 .  Cholecalciferol (VITAMIN D) 50 MCG (2000 UT) CAPS, Take 2,000 Units by mouth daily., Disp: , Rfl:  .  EPINEPHrine 0.3 mg/0.3 mL IJ SOAJ injection, Inject 0.3 mLs (0.3 mg total) into the muscle once. (Patient taking differently: Inject 0.3 mg into the muscle daily as needed for anaphylaxis. ), Disp: 1 Device, Rfl: 12 .  famotidine (PEPCID) 20 MG tablet, Take 20 mg by mouth daily. , Disp: , Rfl: 0 .  ibuprofen (ADVIL,MOTRIN) 200 MG tablet, Take 200 mg by mouth daily as needed for mild pain. , Disp: , Rfl:  .  losartan (COZAAR) 50 MG tablet, TAKE 1 TABLET BY MOUTH DAILY (Patient taking differently: Take 50 mg by mouth at bedtime. ), Disp: 90 tablet, Rfl: 0 .  mometasone (NASONEX) 50 MCG/ACT nasal spray, Place 2 sprays into the nose daily. (Patient taking differently: Place 2 sprays into the nose daily as needed (allergies). ), Disp: 17 g, Rfl: 12 .  montelukast  (SINGULAIR) 10 MG tablet, TAKE 1 TABLET BY MOUTH AT BEDTIME (Patient taking differently: Take 10 mg by mouth daily as needed (allergies). ), Disp: 30 tablet, Rfl: 12 .  Omega-3 1000 MG CAPS, Take by mouth daily., Disp: , Rfl:   Review of Systems  Constitutional: Negative for appetite change, chills, fatigue and fever.  HENT: Positive for ear pain and mouth sores (cold sore).        Right side Facial pain and tingling sensation  Eyes: Positive for pain. Negative for photophobia.  Respiratory: Negative for chest tightness and shortness of breath.   Cardiovascular: Negative for chest pain and palpitations.  Gastrointestinal: Negative for abdominal pain, nausea and vomiting.  Musculoskeletal: Positive for neck pain.  Neurological: Negative for dizziness and weakness.    Social History   Tobacco Use  . Smoking status: Former Smoker    Packs/day: 0.50    Years: 10.00    Pack years: 5.00    Types: Cigarettes    Quit date: 04/20/2007    Years since quitting: 11.4  . Smokeless tobacco: Never Used  . Tobacco comment: social smoker back then  Substance Use Topics  . Alcohol use: Yes    Comment: occas      Objective:   BP 138/88 (BP Location: Right Arm, Patient Position: Sitting,  Cuff Size: Normal)   Pulse 95   Temp (!) 97.3 F (36.3 C) (Temporal)   Resp 16   Wt 148 lb (67.1 kg)   SpO2 98% Comment: room air  BMI 23.89 kg/m  Vitals:   10/15/18 1552  BP: 138/88  Pulse: 95  Resp: 16  Temp: (!) 97.3 F (36.3 C)  TempSrc: Temporal  SpO2: 98%  Weight: 148 lb (67.1 kg)  Body mass index is 23.89 kg/m.   Physical Exam  General Appearance:    Alert, cooperative, no distress  HENT:   Aphthous ulcer right upper lip. No other lesions, mild right submandibular LAD  Eyes:    PERRL, conjunctiva/corneas clear, EOM's intact       Lungs:     Clear to auscultation bilaterally, respirations unlabored  Heart:    Normal heart rate. Normal rhythm. No murmurs, rubs, or gallops.    Neurologic:   Awake, alert, oriented x 3. No apparent focal neurological           defect.          Assessment & Plan    1. Cold sore  - valACYclovir (VALTREX) 1000 MG tablet; Take 1 tablet (1,000 mg total) by mouth every 8 (eight) hours for 7 days.  Dispense: 21 tablet; Refill: 0 Cover for possible early zoster  2. Lymphadenopathy  - cephALEXin (KEFLEX) 500 MG capsule; Take 1 capsule (500 mg total) by mouth 3 (three) times daily for 7 days.  Dispense: 28 capsule; Refill: 0 Call if symptoms change or if not rapidly improving.   She also reports that her BP had been elevated after her losartan was changed to a different manufacturer. Has been up to the 160s/100s. She talked to her pharmacy who has ordered her a supply from previous manufacturer to see if this controls her BP.  She did not tolerate higher dose of losartan in the past. If BP remains high she is going to call back for adjustment of her medications.   The entirety of the information documented in the History of Present Illness, Review of Systems and Physical Exam were personally obtained by me. Portions of this information were initially documented by Meyer Cory, CMA and reviewed by me for thoroughness and accuracy.      Lelon Huh, MD  [facility]

## 2018-10-15 NOTE — Telephone Encounter (Signed)
Oral Chemotherapy Pharmacist Encounter  Provider planned start date of 11/04/2018. Patient is aware of this Xeloda start plan.  Patient Education I spoke with patient for overview of new oral chemotherapy medication: Xeloda (capecitabine) for the adjuvant treatment of stage IIa triple triple negative breast cancer, planned duration 6-8 cycles.  Pt is doing well. Counseled patient on administration, dosing, side effects, monitoring, drug-food interactions, safe handling, storage, and disposal. Patient will take 4 tablets (2,000 mg total) by mouth 2 (two) times daily after a meal. Take for 14 days, then hold for 7 days. Repeat every 21 days.  Side effects include but not limited to: diarrhea, hand-foot syndrome, N/V, fatigue, decreased wbc/plt.    Reviewed with patient importance of keeping a medication schedule and plan for any missed doses.  Shelby [name] voiced understanding and appreciation. All questions answered. She plans to use her phone calendar to help her keep track of taking the medication.  Provided patient with [facility] phone number. Patient knows to call the office with questions or concerns. Oral Chemotherapy Navigation Clinic will continue to follow.  Darl Pikes, PharmD, BCPS, [facility] Hematology/Oncology Clinical Pharmacist [facility]/HP/AP [facility] [phone]  10/15/2018 10:57 AM

## 2018-10-15 NOTE — Patient Instructions (Signed)
.   Please review the attached list of medications and notify my office if there are any errors.   . Please bring all of your medications to every appointment so we can make sure that our medication list is the same as yours.   . It is especially important to get the annual flu vaccine this year. If you haven't had it already, please go to your pharmacy or call the office as soon as possible to schedule you flu shot.  

## 2018-10-16 ENCOUNTER — Ambulatory Visit
Admission: RE | Admit: 2018-10-16 | Discharge: 2018-10-16 | Disposition: A | Discharge status: Home / Self Care | Payer: No Typology Code available for payment source | Source: Ambulatory Visit | Attending: Radiation Oncology | Admitting: Radiation Oncology

## 2018-10-16 ENCOUNTER — Other Ambulatory Visit: Payer: Self-pay

## 2018-10-16 DIAGNOSIS — Z510 Encounter for antineoplastic radiation therapy: Secondary | ICD-10-CM | POA: Diagnosis not present

## 2018-10-23 ENCOUNTER — Encounter: Payer: Self-pay | Admitting: Family Medicine

## 2018-10-28 ENCOUNTER — Encounter: Payer: Self-pay | Admitting: Family Medicine

## 2018-10-29 ENCOUNTER — Encounter: Payer: Self-pay | Admitting: Radiation Oncology

## 2018-10-29 ENCOUNTER — Encounter: Payer: Self-pay | Admitting: Oncology

## 2018-10-31 ENCOUNTER — Other Ambulatory Visit: Payer: Self-pay

## 2018-10-31 DIAGNOSIS — M549 Dorsalgia, unspecified: Secondary | ICD-10-CM

## 2018-10-31 DIAGNOSIS — M2569 Stiffness of other specified joint, not elsewhere classified: Secondary | ICD-10-CM

## 2018-11-04 ENCOUNTER — Encounter: Payer: Self-pay | Admitting: Surgery

## 2018-11-04 ENCOUNTER — Inpatient Hospital Stay: Payer: No Typology Code available for payment source | Attending: Oncology

## 2018-11-04 ENCOUNTER — Other Ambulatory Visit: Payer: Self-pay

## 2018-11-04 DIAGNOSIS — R531 Weakness: Secondary | ICD-10-CM | POA: Insufficient documentation

## 2018-11-04 DIAGNOSIS — Z23 Encounter for immunization: Secondary | ICD-10-CM | POA: Insufficient documentation

## 2018-11-04 DIAGNOSIS — R5383 Other fatigue: Secondary | ICD-10-CM | POA: Diagnosis not present

## 2018-11-04 DIAGNOSIS — Z171 Estrogen receptor negative status [ER-]: Secondary | ICD-10-CM | POA: Insufficient documentation

## 2018-11-04 DIAGNOSIS — C50312 Malignant neoplasm of lower-inner quadrant of left female breast: Secondary | ICD-10-CM | POA: Insufficient documentation

## 2018-11-04 DIAGNOSIS — D649 Anemia, unspecified: Secondary | ICD-10-CM | POA: Insufficient documentation

## 2018-11-04 DIAGNOSIS — M545 Low back pain: Secondary | ICD-10-CM | POA: Insufficient documentation

## 2018-11-04 DIAGNOSIS — R110 Nausea: Secondary | ICD-10-CM | POA: Insufficient documentation

## 2018-11-04 DIAGNOSIS — D72819 Decreased white blood cell count, unspecified: Secondary | ICD-10-CM | POA: Diagnosis not present

## 2018-11-04 DIAGNOSIS — Z87891 Personal history of nicotine dependence: Secondary | ICD-10-CM | POA: Insufficient documentation

## 2018-11-04 LAB — CBC WITH DIFFERENTIAL/PLATELET
Abs Immature Granulocytes: 0.01 10*3/uL (ref 0.00–0.07)
Basophils Absolute: 0 10*3/uL (ref 0.0–0.1)
Basophils Relative: 0 %
Eosinophils Absolute: 0.1 10*3/uL (ref 0.0–0.5)
Eosinophils Relative: 1 %
HCT: 34.8 % — ABNORMAL LOW (ref 36.0–46.0)
Hemoglobin: 12 g/dL (ref 12.0–15.0)
Immature Granulocytes: 0 %
Lymphocytes Relative: 18 %
Lymphs Abs: 0.8 10*3/uL (ref 0.7–4.0)
MCH: 30.7 pg (ref 26.0–34.0)
MCHC: 34.5 g/dL (ref 30.0–36.0)
MCV: 89 fL (ref 80.0–100.0)
Monocytes Absolute: 0.3 10*3/uL (ref 0.1–1.0)
Monocytes Relative: 8 %
Neutro Abs: 3.2 10*3/uL (ref 1.7–7.7)
Neutrophils Relative %: 73 %
Platelets: 172 10*3/uL (ref 150–400)
RBC: 3.91 MIL/uL (ref 3.87–5.11)
RDW: 12.4 % (ref 11.5–15.5)
WBC: 4.4 10*3/uL (ref 4.0–10.5)
nRBC: 0 % (ref 0.0–0.2)

## 2018-11-07 ENCOUNTER — Telehealth: Payer: Self-pay | Admitting: *Deleted

## 2018-11-07 NOTE — Telephone Encounter (Signed)
Please schedule for [facility] tomorrow

## 2018-11-07 NOTE — Telephone Encounter (Signed)
Accepts appointment for 945 tomorrow

## 2018-11-07 NOTE — Telephone Encounter (Signed)
Patient called reporting that she recently started taking Capecitabine and she is now having pain in her rib and pain with deep breath which then leads to hiccoughs. This has been going on for several days and at first she thought she may be having muscle spasms, but she is not sure what to think now. Please advise

## 2018-11-08 ENCOUNTER — Other Ambulatory Visit: Payer: Self-pay

## 2018-11-08 ENCOUNTER — Encounter: Payer: Self-pay | Admitting: Nurse Practitioner

## 2018-11-08 ENCOUNTER — Inpatient Hospital Stay (HOSPITAL_BASED_OUTPATIENT_CLINIC_OR_DEPARTMENT_OTHER): Payer: No Typology Code available for payment source | Admitting: Nurse Practitioner

## 2018-11-08 VITALS — BP 122/86 | HR 122 | Temp 99.2°F | Resp 18 | Wt 144.0 lb

## 2018-11-08 DIAGNOSIS — C50312 Malignant neoplasm of lower-inner quadrant of left female breast: Secondary | ICD-10-CM | POA: Diagnosis not present

## 2018-11-08 DIAGNOSIS — M549 Dorsalgia, unspecified: Secondary | ICD-10-CM

## 2018-11-08 MED ORDER — METHOCARBAMOL 750 MG PO TABS: ORAL_TABLET | ORAL | 0 refills | Status: AC

## 2018-11-08 MED FILL — LOSARTAN POTASSIUM 50 MG TA: 50 | 30 days supply | Qty: 30 | Fill #0

## 2018-11-08 NOTE — Progress Notes (Signed)
Symptom Management [place]  [phone] [phone]  Patient Care Team: Jerrol Banana., MD as PCP - General (Family Medicine) Minna Merritts, MD as Consulting Physician (Cardiology)   Name of the [name]  [id number]  1970-06-21   Date of visit: 11/08/18  Diagnosis-breast cancer  Chief complaint/ Reason for visit-back rib pain  Heme/Onc history:  Oncology History Overview Note  Initially seen by nurse midwife Lorelle Gibbs at gynecologist office for left tender breast mass.  Had breast ultrasound and and mammogram revealing suspicious palpable mass of left breast requiring biopsy.  Biopsy revealed grade 3 invasive ductal carcinoma.  Positive metastatic carcinoma and left axillary lymph node.  Met with Dr. Grayland Ormond on 12/24/2017 where he recommended neoadjuvant chemotherapy using Adriamycin, Cytoxan with Neulasta support followed by carbo/Taxol.  XRT may not be needed given patient would like mastectomy of left breast.   Had port placed by Dr. Bary Castilla on 12/31/2017.  CT abdomen/pelvis/chest with contrast on 01/02/2018 did not reveal metastatic disease.  Pretreatment MUGA revealed an EF of 71%.    Primary cancer of lower-inner quadrant of left female breast ([place])  12/23/2017 Initial Diagnosis   Primary cancer of lower-inner quadrant of left female breast ([place])   12/24/2017 Cancer Staging   Staging form: Breast, AJCC 8th Edition - Clinical stage from 12/24/2017: Stage IIIB (cT2, cN1, cM0, G3, ER-, PR-, HER2-) - Signed by Lloyd Huger, MD on 12/24/2017   01/03/2018 - 06/26/2018 Chemotherapy   The patient had DOXOrubicin (ADRIAMYCIN) chemo injection 104 mg, 60 mg/m2 = 104 mg, Intravenous,  Once, 4 of 4 cycles Administration: 104 mg (01/03/2018), 104 mg (01/17/2018), 104 mg (01/31/2018), 104 mg (02/14/2018) palonosetron (ALOXI) injection 0.25 mg, 0.25 mg, Intravenous,  Once, 8 of 8 cycles  Administration: 0.25 mg (01/03/2018), 0.25 mg (02/28/2018), 0.25 mg (01/17/2018), 0.25 mg (03/21/2018), 0.25 mg (01/31/2018), 0.25 mg (02/14/2018), 0.25 mg (05/02/2018), 0.25 mg (05/30/2018) pegfilgrastim-cbqv (UDENYCA) injection 6 mg, 6 mg, Subcutaneous, Once, 6 of 6 cycles Administration: 6 mg (01/04/2018), 6 mg (01/18/2018), 6 mg (02/01/2018), 6 mg (02/15/2018), 6 mg (05/31/2018) CARBOplatin (PARAPLATIN) 680 mg in sodium chloride 0.9 % 250 mL chemo infusion, 680 mg (100 % of original dose 676.2 mg), Intravenous,  Once, 4 of 4 cycles Dose modification:   (original dose 676.2 mg, Cycle 5) Administration: 680 mg (02/28/2018), 680 mg (03/21/2018), 680 mg (05/02/2018), 680 mg (05/30/2018) cyclophosphamide (CYTOXAN) 1,000 mg in sodium chloride 0.9 % 250 mL chemo infusion, 1,040 mg, Intravenous,  Once, 4 of 4 cycles Administration: 1,000 mg (01/03/2018), 1,000 mg (01/17/2018), 1,000 mg (01/31/2018), 1,000 mg (02/14/2018) PACLitaxel (TAXOL) 138 mg in sodium chloride 0.9 % 250 mL chemo infusion (</= 45m/m2), 80 mg/m2 = 138 mg, Intravenous,  Once, 4 of 4 cycles Administration: 138 mg (02/28/2018), 138 mg (03/07/2018), 138 mg (03/14/2018), 138 mg (03/21/2018), 138 mg (04/04/2018), 138 mg (04/18/2018), 138 mg (05/02/2018), 138 mg (05/16/2018), 138 mg (05/23/2018), 138 mg (05/30/2018), 138 mg (06/13/2018), 138 mg (06/20/2018) fosaprepitant (EMEND) 150 mg, dexamethasone (DECADRON) 12 mg in sodium chloride 0.9 % 145 mL IVPB, , Intravenous,  Once, 8 of 8 cycles Administration:  (01/03/2018),  (02/28/2018),  (01/17/2018),  (03/21/2018),  (01/31/2018),  (02/14/2018),  (05/02/2018),  (05/30/2018)  for chemotherapy treatment.    08/01/2018 Cancer Staging   Staging form: Breast, AJCC 8th Edition - Pathologic stage from 08/01/2018: No Stage Recommended (ypT1c, pN1a, cM0, ER-, PR-, HER2-) - Signed by FLloyd Huger MD on 08/01/2018   10/09/2018 -  Chemotherapy   The patient had [No matching medication found in this treatment plan]  for chemotherapy treatment.       Interval history-patient presents to symptom management clinic with complaints pain which she localizes to her right back/rib area.  Pain started couple days ago and has persisted since that time.  She describes as sharp.  Pain comes and goes.  Rates 7 of 10.  Has tried OTCs without significant relief.  Has been applying to area with some improvement.  She saw a chiropractor a few days ago for an adjustment which was the first since her diagnosis.  Denies injury or trauma. Denies any neurologic complaints. Denies recent fevers or illnesses. Denies any easy bleeding or bruising. Reports good appetite and denies weight loss. Denies chest pain. Denies any nausea, vomiting, constipation, or diarrhea. Denies urinary complaints. Patient offers no further specific complaints today.  ECOG FS:1 - Symptomatic but completely ambulatory  Review of systems- Review of Systems  Constitutional: Negative for chills, fever, malaise/fatigue and weight loss.  HENT: Negative for hearing loss, nosebleeds, sore throat and tinnitus.   Eyes: Negative for blurred vision and double vision.  Respiratory: Negative for cough, hemoptysis, shortness of breath and wheezing.   Cardiovascular: Negative for chest pain, palpitations and leg swelling.  Gastrointestinal: Negative for abdominal pain, blood in stool, constipation, diarrhea, melena, nausea and vomiting.  Genitourinary: Negative for dysuria and urgency.  Musculoskeletal: Positive for back pain. Negative for falls, joint pain and myalgias.  Skin: Negative for itching and rash.  Neurological: Negative for dizziness, tingling, sensory change, loss of consciousness, weakness and headaches.  Endo/Heme/Allergies: Negative for environmental allergies. Does not bruise/bleed easily.  Psychiatric/Behavioral: Negative for depression. The patient is nervous/anxious. The patient does not have insomnia.      Current treatment-Xeloda  Allergies  Allergen Reactions  . Hydrocodone  Hives    Swollen face - many years ago. Thinks she has tolerated since.  . Iodine Hives  . Peanut Butter Flavor Other (See Comments)    Made mouth tingle  . Shellfish Allergy Hives    Past Medical History:  Diagnosis Date  . Anemia    h/o with pregnancy  . Anxiety   . Asthma    allergy induced-no inhaler  . Cancer [facility])    breast left   . Complication of anesthesia   . Environmental allergies   . Family history of adverse reaction to anesthesia    son-breathing problems-coded 1st time when he was 2 and had another surgery at 51 and had to be admitted for breathing problems  . Family history of breast cancer   . GERD (gastroesophageal reflux disease)   . Headache    migraines  . Hypertension   . Mitral valve disease   . Mitral valve prolapse   . Painful menstrual periods   . PONV (postoperative nausea and vomiting)   . Vertigo     Past Surgical History:  Procedure Laterality Date  . ABDOMINAL HYSTERECTOMY  2010   supracervical   . AXILLARY LYMPH NODE DISSECTION Left 07/12/2018   Procedure: AXILLARY LYMPH NODE DISSECTION;  Surgeon: Robert Bellow, MD;  Location: [facility] ORS;  Service: General;  Laterality: Left;  . BREAST BIOPSY Left 11/2017   invasive ductal carcinoma and metastatic LN  . BREAST BIOPSY WITH SENTINEL LYMPH NODE BIOPSY AND NEEDLE LOCALIZATION Left 07/12/2018   Procedure: BREAST BIOPSY WIDE EXCISION WITH SENTINEL NODE  AND NEEDLE LOCALIZATION OF OXILLARY NODS LEFT;  Surgeon: Robert Bellow, MD;  Location: [facility] ORS;  Service: General;  Laterality: Left;  . BUNIONECTOMY Bilateral   . MANDIBLE RECONSTRUCTION     age 20-underbite  . PORTACATH PLACEMENT Right 12/31/2017   Procedure: INSERTION PORT-A-CATH;  Surgeon: Robert Bellow, MD;  Location: [facility] ORS;  Service: General;  Laterality: Right;  . RE-EXCISION OF BREAST LUMPECTOMY Left 07/24/2018   Procedure: RE-EXCISION OF BREAST LUMPECTOMY LEFT;  Surgeon: Robert Bellow, MD;  Location: [facility] ORS;   Service: General;  Laterality: Left;  . SHOULDER ARTHROSCOPY WITH OPEN ROTATOR CUFF REPAIR Right 04/26/2017   Procedure: SHOULDER ARTHROSCOPY WITH OPEN ROTATOR CUFF REPAIR,SUBACROMINAL DECOMPRESSION;  Surgeon: Thornton Park, MD;  Location: [facility] ORS;  Service: Orthopedics;  Laterality: Right;  . TONSILLECTOMY      Social History   Socioeconomic History  . Marital status: Divorced    Spouse name: Not on file  . Number of children: Not on file  . Years of education: Not on file  . Highest education level: Not on file  Occupational History  . Not on file  Social Needs  . Financial resource strain: Not on file  . Food insecurity    Worry: Not on file    Inability: Not on file  . Transportation needs    Medical: Not on file    Non-medical: Not on file  Tobacco Use  . Smoking status: Former Smoker    Packs/day: 0.50    Years: 10.00    Pack years: 5.00    Types: Cigarettes    Quit date: 04/20/2007    Years since quitting: 11.5  . Smokeless tobacco: Never Used  . Tobacco comment: social smoker back then  Substance and Sexual Activity  . Alcohol use: Yes    Comment: occas  . Drug use: No  . Sexual activity: Yes  Lifestyle  . Physical activity    Days per week: Not on file    Minutes per session: Not on file  . Stress: Not on file  Relationships  . Social Herbalist on phone: Not on file    Gets together: Not on file    Attends religious service: Not on file    Active member of club or organization: Not on file    Attends meetings of clubs or organizations: Not on file    Relationship status: Not on file  . Intimate partner violence    Fear of current or ex partner: Not on file    Emotionally abused: Not on file    Physically abused: Not on file    Forced sexual activity: Not on file  Other Topics Concern  . Not on file  Social History Narrative  . Not on file    Family History  Problem Relation Age of Onset  . Breast cancer Mother 2  . Diabetes  Father   . Cancer Maternal Uncle        colon  . Breast cancer Maternal Grandmother 25     Current Outpatient Medications:  .  capecitabine (XELODA) 500 MG tablet, Take 4 tablets (2,000 mg total) by mouth 2 (two) times daily after a meal. Take for 14 days, then hold for 7 days. Repeat every 21 days., Disp: 112 tablet, Rfl: 4 .  Cholecalciferol (VITAMIN D) 50 MCG (2000 UT) CAPS, Take 2,000 Units by mouth daily., Disp: , Rfl:  .  famotidine (PEPCID) 20 MG tablet, Take 20 mg by mouth daily. , Disp: , Rfl: 0 .  losartan (COZAAR) 50 MG tablet, TAKE  1 TABLET BY MOUTH DAILY (Patient taking differently: Take 50 mg by mouth at bedtime. ), Disp: 90 tablet, Rfl: 0 .  mometasone (NASONEX) 50 MCG/ACT nasal spray, Place 2 sprays into the nose daily. (Patient taking differently: Place 2 sprays into the nose daily as needed (allergies). ), Disp: 17 g, Rfl: 12 .  montelukast (SINGULAIR) 10 MG tablet, TAKE 1 TABLET BY MOUTH AT BEDTIME (Patient taking differently: Take 10 mg by mouth daily as needed (allergies). ), Disp: 30 tablet, Rfl: 12 .  Omega-3 1000 MG CAPS, Take by mouth daily., Disp: , Rfl:  .  ALPRAZolam (XANAX) 0.5 MG tablet, Take 0.5-1 tablets (0.25-0.5 mg total) by mouth 2 (two) times daily as needed for anxiety. (Patient not taking: Reported on 11/08/2018), Disp: 60 tablet, Rfl: 0 .  EPINEPHrine 0.3 mg/0.3 mL IJ SOAJ injection, Inject 0.3 mLs (0.3 mg total) into the muscle once. (Patient not taking: Reported on 11/08/2018), Disp: 1 Device, Rfl: 12 .  ibuprofen (ADVIL,MOTRIN) 200 MG tablet, Take 200 mg by mouth daily as needed for mild pain. , Disp: , Rfl:   Physical exam:  Vitals:   11/08/18 0957  BP: 122/86  Pulse: (!) 122  Resp: 18  Temp: 99.2 F (37.3 C)  TempSrc: Tympanic  SpO2: 100%  Weight: 144 lb (65.3 kg)   Physical Exam Constitutional:      General: She is not in acute distress.    Appearance: Normal appearance.  Abdominal:     General: There is no distension.     Tenderness:  There is no abdominal tenderness. There is no guarding.  Musculoskeletal:     Cervical back: She exhibits normal range of motion, no tenderness, no swelling, no edema, no pain and no spasm.     Thoracic back: She exhibits tenderness, edema and spasm. She exhibits no bony tenderness and no deformity.     Lumbar back: She exhibits normal range of motion, no tenderness, no bony tenderness, no edema, no deformity, no laceration and no spasm.       Back:  Skin:    General: Skin is warm and dry.  Neurological:     Mental Status: She is alert and oriented to person, place, and time.     Motor: No weakness.     Gait: Gait normal.  Psychiatric:        Mood and Affect: Mood normal.        Behavior: Behavior normal.      CMP Latest Ref Rng & Units 10/09/2018  Glucose 70 - 99 mg/dL 112(H)  BUN 6 - 20 mg/dL 20  Creatinine 0.44 - 1.00 mg/dL 0.59  Sodium 135 - 145 mmol/L 138  Potassium 3.5 - 5.1 mmol/L 4.0  Chloride 98 - 111 mmol/L 101  CO2 22 - 32 mmol/L 24  Calcium 8.9 - 10.3 mg/dL 10.1  Total Protein 6.5 - 8.1 g/dL 7.2  Total Bilirubin 0.3 - 1.2 mg/dL 0.6  Alkaline Phos 38 - 126 U/L 55  AST 15 - 41 U/L 26  ALT 0 - 44 U/L 22   CBC Latest Ref Rng & Units 11/04/2018  WBC 4.0 - 10.5 K/uL 4.4  Hemoglobin 12.0 - 15.0 g/dL 12.0  Hematocrit 36.0 - 46.0 % 34.8(L)  Platelets 150 - 400 K/uL 172    No images are attached to the encounter.  No results found.  Assessment and plan- Patient is a 48 y.o. female diagnosed with pathologic stage IIa triple negative invasive carcinoma of the lower inner quadrant  of left breast now s/p neoadjuvant chemotherapy with Adriamycin and Cytoxan followed by carbo-Taxol.  Initial resection on 07/12/2018 was followed by reexcision for positive margins on 07/24/2018.  She completed adjuvant XRT on 10/16/2018 and is currently receiving adjuvant Xeloda.  She presents to symptom management clinic for muscle spasm  Muscle Spasm- suspect activation of trigger point vs muscle  spasm after recent chiropractic adjustment. Discussed options for work up including imaging which patient declined. Side effects w/ flexeril in the past. Start robaxin 1500 mg three times a day as needed for 3 days then 750 mg three times a day for four days as needed for pain.  Continue over-the-counter medications.  Can try topical analgesics as well.  Continue ice and heat alternating.   Disposition:  Follow-up with Dr. Grayland Ormond as scheduled.  If symptoms do not improve or worsen, consider imaging and reevaluation   Visit Diagnosis 1. Trigger point with back pain     Patient expressed understanding and was in agreement with this plan. She also understands that She can call clinic at any time with any questions, concerns, or complaints.   Thank you for allowing me to participate in the care of this very pleasant patient.   Beckey Rutter, DNP, AGNP-C [facility] (work cell) [phone] (office)  CC: Dr. Grayland Ormond

## 2018-11-09 ENCOUNTER — Encounter: Payer: Self-pay | Admitting: Nurse Practitioner

## 2018-11-12 MED FILL — LOSARTAN POTASSIUM 50 MG TA: 50 | 30 days supply | Qty: 30 | Fill #1

## 2018-11-13 ENCOUNTER — Encounter: Payer: Self-pay | Admitting: Oncology

## 2018-11-15 ENCOUNTER — Other Ambulatory Visit: Payer: Self-pay

## 2018-11-15 ENCOUNTER — Other Ambulatory Visit: Payer: Self-pay | Admitting: Family Medicine

## 2018-11-15 NOTE — Telephone Encounter (Signed)
Scheduled a virtual visit on December 03, 2018 @ 4 PM for medication refills.

## 2018-11-18 ENCOUNTER — Encounter: Payer: Self-pay | Admitting: Oncology

## 2018-11-18 ENCOUNTER — Other Ambulatory Visit: Payer: Self-pay

## 2018-11-18 ENCOUNTER — Ambulatory Visit
Admission: RE | Admit: 2018-11-18 | Discharge: 2018-11-18 | Disposition: A | Discharge status: Home / Self Care | Payer: No Typology Code available for payment source | Source: Ambulatory Visit | Attending: Radiation Oncology | Admitting: Radiation Oncology

## 2018-11-18 ENCOUNTER — Encounter: Payer: Self-pay | Admitting: Radiation Oncology

## 2018-11-18 VITALS — BP 126/91 | HR 88 | Temp 97.1°F | Resp 18 | Wt 144.7 lb

## 2018-11-18 DIAGNOSIS — C50312 Malignant neoplasm of lower-inner quadrant of left female breast: Secondary | ICD-10-CM | POA: Diagnosis not present

## 2018-11-18 DIAGNOSIS — Z923 Personal history of irradiation: Secondary | ICD-10-CM | POA: Insufficient documentation

## 2018-11-18 DIAGNOSIS — G629 Polyneuropathy, unspecified: Secondary | ICD-10-CM | POA: Diagnosis not present

## 2018-11-18 DIAGNOSIS — Z171 Estrogen receptor negative status [ER-]: Secondary | ICD-10-CM | POA: Insufficient documentation

## 2018-11-18 MED FILL — CAPECITABINE 500 MG TABS: 500 | 21 days supply | Qty: 112 | Fill #1

## 2018-11-18 NOTE — Progress Notes (Signed)
Radiation Oncology Follow up Note  Name: Shelby [name]   Date:   11/18/2018 MRN:  [id number] DOB: 1970-10-28    This 48 y.o. female presents to the clinic today for at 1 month status post whole breast radiation to her left breast and peripheral emphatic's for stage T1 N1 M0 triple negative invasive mammary carcinoma status post neoadjuvant chemotherapy with wide local excision and lymph node dissection.  REFERRING PROVIDER: Jerrol Banana.,*  HPI: Patient is a 48 year old female now at 1 month having completed whole breast radiation to her left breast status post neoadjuvant chemotherapy for triple negative stage IIa invasive mammary carcinoma..  She is seen today in routine follow-up.  She is recently started capecitabine for her triple negative breast cancer which is causing some peripheral neuropathy in her lower extremities.  She is also felt a small knot in her left breast which I examined and feel is benign scarring.  COMPLICATIONS OF TREATMENT: none  FOLLOW UP COMPLIANCE: keeps appointments   PHYSICAL EXAM:  BP (!) 126/91   Pulse 88   Temp (!) 97.1 F (36.2 C)   Resp 18   Wt 144 lb 11.2 oz (65.6 kg)   BMI 23.36 kg/m  Lungs are clear to A&P cardiac examination essentially unremarkable with regular rate and rhythm. No dominant mass or nodularity is noted in either breast in 2 positions examined. Incision is well-healed. No axillary or supraclavicular adenopathy is appreciated. Cosmetic result is excellent.  Area of her concern in the inframammary fold on the left is compatible scar tissue no evidence of malignancy or area of concern.  She has no evidence of lymphedema in her left upper extremity.  Well-developed well-nourished patient in NAD. HEENT reveals PERLA, EOMI, discs not visualized.  Oral cavity is clear. No oral mucosal lesions are identified. Neck is clear without evidence of cervical or supraclavicular adenopathy. Lungs are clear to A&P. Cardiac examination is  essentially unremarkable with regular rate and rhythm without murmur rub or thrill. Abdomen is benign with no organomegaly or masses noted. Motor sensory and DTR levels are equal and symmetric in the upper and lower extremities. Cranial nerves II through XII are grossly intact. Proprioception is intact. No peripheral adenopathy or edema is identified. No motor or sensory levels are noted. Crude visual fields are within normal range.  RADIOLOGY RESULTS: No current films to review  PLAN: Present time patient is doing well she will continue with medical oncology under capecitabine treatment.  I am pleased with her overall progress.  I have asked to see her back in 4 to 5 months for follow-up.  Patient knows to call sooner with any concerns.  I would like to take this opportunity to thank you for allowing me to participate in the care of your patient.Noreene Filbert, MD

## 2018-11-19 ENCOUNTER — Encounter: Payer: Self-pay | Admitting: Oncology

## 2018-11-22 ENCOUNTER — Other Ambulatory Visit: Payer: Self-pay

## 2018-11-22 NOTE — Progress Notes (Signed)
Patient pre screened for office appointment, no questions or concerns today. 

## 2018-11-24 NOTE — Progress Notes (Signed)
[place]  [phone] [phone]) [phone]  ID: Carolan Clines OB: 12/18/1970  MR#: [id number]  HAL#:[id number]  Patient Care Team: Jerrol Banana., MD as PCP - General (Family Medicine) [name], MD as Consulting Physician (Cardiology)  CHIEF COMPLAINT: Pathologic stage IIa triple negative invasive carcinoma of the lower inner quadrant of the left breast.  INTERVAL HISTORY: Patient returns to clinic today for further evaluation and to assess her toleration of cycle 1 of Xeloda.  She had significant weakness and fatigue with some mild nausea.  She also had burning pain on her feet that has since resolved. She denies any recent fevers or illnesses.  She has no neurologic complaints.  She has a good appetite and denies weight loss.  She denies any chest pain, shortness of breath, cough, or hemoptysis.  She denies any nausea, vomiting, constipation, or diarrhea.  She has no urinary complaints.  Patient offers no further specific complaints today.  REVIEW OF SYSTEMS:   Review of Systems  Constitutional: Positive for malaise/fatigue. Negative for fever and weight loss.  HENT: Negative.  Negative for congestion, ear pain and sore throat.   Respiratory: Negative.  Negative for cough, hemoptysis and shortness of breath.   Cardiovascular: Negative.  Negative for chest pain and leg swelling.  Gastrointestinal: Positive for nausea. Negative for abdominal pain and melena.  Genitourinary: Negative.  Negative for dysuria.  Musculoskeletal: Negative.  Negative for back pain.  Skin: Negative.  Negative for rash.  Neurological: Positive for weakness. Negative for dizziness, focal weakness and headaches.  Psychiatric/Behavioral: The patient is nervous/anxious.     As per HPI. Otherwise, a complete review of systems is negative.  PAST MEDICAL HISTORY: Past Medical History:  Diagnosis Date  . Anemia    h/o with pregnancy  . Anxiety   . Asthma    allergy induced-no inhaler  . Cancer [facility])    breast left   . Complication of anesthesia   . Environmental allergies   . Family history of adverse reaction to anesthesia    son-breathing problems-coded 1st time when he was 2 and had another surgery at 68 and had to be admitted for breathing problems  . Family history of breast cancer   . GERD (gastroesophageal reflux disease)   . Headache    migraines  . Hypertension   . Mitral valve disease   . Mitral valve prolapse   . Painful menstrual periods   . PONV (postoperative nausea and vomiting)   . Vertigo     PAST SURGICAL HISTORY: Past Surgical History:  Procedure Laterality Date  . ABDOMINAL HYSTERECTOMY  2010   supracervical   . AXILLARY LYMPH NODE DISSECTION Left 07/12/2018   Procedure: AXILLARY LYMPH NODE DISSECTION;  Surgeon: Robert Bellow, MD;  Location: [facility] ORS;  Service: General;  Laterality: Left;  . BREAST BIOPSY Left 11/2017   invasive ductal carcinoma and metastatic LN  . BREAST BIOPSY WITH SENTINEL LYMPH NODE BIOPSY AND NEEDLE LOCALIZATION Left 07/12/2018   Procedure: BREAST BIOPSY WIDE EXCISION WITH SENTINEL NODE  AND NEEDLE LOCALIZATION OF OXILLARY NODS LEFT;  Surgeon: Robert Bellow, MD;  Location: [facility] ORS;  Service: General;  Laterality: Left;  . BUNIONECTOMY Bilateral   . MANDIBLE RECONSTRUCTION     age 48-underbite  . PORTACATH PLACEMENT Right 12/31/2017   Procedure: INSERTION PORT-A-CATH;  Surgeon: Robert Bellow, MD;  Location: [facility] ORS;  Service: General;  Laterality: Right;  . RE-EXCISION OF BREAST LUMPECTOMY Left 07/24/2018  Procedure: RE-EXCISION OF BREAST LUMPECTOMY LEFT;  Surgeon: Robert Bellow, MD;  Location: [facility] ORS;  Service: General;  Laterality: Left;  . SHOULDER ARTHROSCOPY WITH OPEN ROTATOR CUFF REPAIR Right 04/26/2017   Procedure: SHOULDER ARTHROSCOPY WITH OPEN ROTATOR CUFF REPAIR,SUBACROMINAL DECOMPRESSION;  Surgeon: Thornton Park, MD;  Location: [facility] ORS;  Service:  Orthopedics;  Laterality: Right;  . TONSILLECTOMY      FAMILY HISTORY: Family History  Problem Relation Age of Onset  . Breast cancer Mother 75  . Diabetes Father   . Cancer Maternal Uncle        colon  . Breast cancer Maternal Grandmother 53    ADVANCED DIRECTIVES (Y/N):  N  HEALTH MAINTENANCE: Social History   Tobacco Use  . Smoking status: Former Smoker    Packs/day: 0.50    Years: 10.00    Pack years: 5.00    Types: Cigarettes    Quit date: 04/20/2007    Years since quitting: 11.6  . Smokeless tobacco: Never Used  . Tobacco comment: social smoker back then  Substance Use Topics  . Alcohol use: Yes    Comment: occas  . Drug use: No     Colonoscopy:  PAP:  Bone density:  Lipid panel:  Allergies  Allergen Reactions  . Hydrocodone Hives    Swollen face - many years ago. Thinks she has tolerated since.  . Iodine Hives  . Peanut Butter Flavor Other (See Comments)    Made mouth tingle  . Shellfish Allergy Hives    Current Outpatient Medications  Medication Sig Dispense Refill  . capecitabine (XELODA) 500 MG tablet Take 4 tablets (2,000 mg total) by mouth 2 (two) times daily after a meal. Take for 14 days, then hold for 7 days. Repeat every 21 days. 112 tablet 4  . famotidine (PEPCID) 20 MG tablet Take 20 mg by mouth daily.   0  . ibuprofen (ADVIL,MOTRIN) 200 MG tablet Take 200 mg by mouth daily as needed for mild pain.     . mometasone (NASONEX) 50 MCG/ACT nasal spray Place 2 sprays into the nose daily. (Patient taking differently: Place 2 sprays into the nose daily as needed (allergies). ) 17 g 12  . montelukast (SINGULAIR) 10 MG tablet TAKE 1 TABLET BY MOUTH AT BEDTIME (Patient taking differently: Take 10 mg by mouth daily as needed (allergies). ) 30 tablet 12  . Omega-3 1000 MG CAPS Take by mouth daily.    Marland Kitchen ALPRAZolam (XANAX) 0.5 MG tablet Take 0.5-1 tablets (0.25-0.5 mg total) by mouth 2 (two) times daily as needed for anxiety. (Patient not taking: Reported  on 11/22/2018) 60 tablet 0  . Cholecalciferol (VITAMIN D) 50 MCG (2000 UT) CAPS Take 2,000 Units by mouth daily.    Marland Kitchen EPINEPHrine 0.3 mg/0.3 mL IJ SOAJ injection Inject 0.3 mLs (0.3 mg total) into the muscle once. (Patient not taking: Reported on 11/08/2018) 1 Device 12  . losartan (COZAAR) 50 MG tablet TAKE 1 TABLET BY MOUTH DAILY 30 tablet 1   No current facility-administered medications for this visit.     OBJECTIVE: There were no vitals filed for this visit.   There is no height or weight on file to calculate BMI.    ECOG FS:0 - Asymptomatic  General: Well-developed, well-nourished, no acute distress. Eyes: Pink conjunctiva, anicteric sclera. HEENT: Normocephalic, moist mucous membranes. Lungs: Clear to auscultation bilaterally. Heart: Regular rate and rhythm. No rubs, murmurs, or gallops. Abdomen: Soft, nontender, nondistended. No organomegaly noted, normoactive bowel sounds. Musculoskeletal: No edema,  cyanosis, or clubbing. Neuro: Alert, answering all questions appropriately. Cranial nerves grossly intact. Skin: No rashes or petechiae noted. Psych: Normal affect.  LAB RESULTS:  Lab Results  Component Value Date   NA 138 10/09/2018   K 4.0 10/09/2018   CL 101 10/09/2018   CO2 24 10/09/2018   GLUCOSE 112 (H) 10/09/2018   BUN 20 10/09/2018   CREATININE 0.59 10/09/2018   CALCIUM 10.1 10/09/2018   PROT 7.2 10/09/2018   ALBUMIN 4.5 10/09/2018   AST 26 10/09/2018   ALT 22 10/09/2018   ALKPHOS 55 10/09/2018   BILITOT 0.6 10/09/2018   GFRNONAA >60 10/09/2018   GFRAA >60 10/09/2018    Lab Results  Component Value Date   WBC 3.3 (L) 11/25/2018   NEUTROABS 1.8 11/25/2018   HGB 11.8 (L) 11/25/2018   HCT 35.0 (L) 11/25/2018   MCV 93.8 11/25/2018   PLT 175 11/25/2018     STUDIES: No results found.  ASSESSMENT: Pathologic stage IIa triple negative invasive carcinoma of the lower inner quadrant of the left breast.  PLAN:    1.  Pathologic stage IIa triple negative  invasive carcinoma of the lower inner quadrant of the left breast: CT scan results from January 01, 2018 did not reveal any metastatic disease.  Patient completed her neoadjuvant chemotherapy with Adriamycin and Cytoxan, followed by carboplatinum and Taxol, on Jun 20, 2018.  Her initial resection was completed on July 12, 2018 and then she underwent reexcision for positive margins on July 24, 2018.  Patient completed adjuvant XRT on October 16, 2018.  She wishes to pursue adjuvant capecitabine 1250 mg/m twice daily for 14 days with 7 days off.  Plan to do 6-8 cycles depending on toxicity.  She has completed cycle 1 of capecitabine.  Given the side effects listed above patient may want to consider dose reduction in the future, but patient will proceed with cycle 2 as planned today.  Return to clinic in 3 weeks for further evaluation and consideration of cycle 3. 2.  Anxiety: Chronic.  Continue Xanax as needed. 3.  Anemia: Mild, monitor. 3.  Leukopenia: Mild, monitor. 4.  Foot pain: Possibly hand-foot syndrome secondary to capecitabine.  Continue topical treatments as directed. 5.  Nausea: Continue antiemetics as prescribed.  I spent a total of 30 minutes face-to-face with the patient of which greater than 50% of the visit was spent in counseling and coordination of care as detailed above.   Patient expressed understanding and was in agreement with this plan. She also understands that She can call clinic at any time with any questions, concerns, or complaints.   Cancer Staging Primary cancer of lower-inner quadrant of left female breast [facility]) Staging form: Breast, AJCC 8th Edition - Clinical stage from 12/24/2017: Stage IIIB (cT2, cN1, cM0, G3, ER-, PR-, HER2-) - Signed by Lloyd Huger, MD on 12/24/2017 - Pathologic stage from 08/01/2018: No Stage Recommended (ypT1c, pN1a, cM0, ER-, PR-, HER2-) - Signed by Lloyd Huger, MD on 08/01/2018   Lloyd Huger, MD   11/26/2018 9:49 AM

## 2018-11-25 ENCOUNTER — Other Ambulatory Visit: Payer: Self-pay

## 2018-11-25 ENCOUNTER — Inpatient Hospital Stay: Payer: No Typology Code available for payment source

## 2018-11-25 ENCOUNTER — Inpatient Hospital Stay (HOSPITAL_BASED_OUTPATIENT_CLINIC_OR_DEPARTMENT_OTHER): Payer: No Typology Code available for payment source | Admitting: Oncology

## 2018-11-25 DIAGNOSIS — C50312 Malignant neoplasm of lower-inner quadrant of left female breast: Secondary | ICD-10-CM

## 2018-11-25 DIAGNOSIS — Z95828 Presence of other vascular implants and grafts: Secondary | ICD-10-CM

## 2018-11-25 DIAGNOSIS — Z23 Encounter for immunization: Secondary | ICD-10-CM

## 2018-11-25 LAB — CBC WITH DIFFERENTIAL/PLATELET
Abs Immature Granulocytes: 0.01 K/uL (ref 0.00–0.07)
Basophils Absolute: 0 K/uL (ref 0.0–0.1)
Basophils Relative: 1 %
Eosinophils Absolute: 0.1 K/uL (ref 0.0–0.5)
Eosinophils Relative: 4 %
HCT: 35 % — ABNORMAL LOW (ref 36.0–46.0)
Hemoglobin: 11.8 g/dL — ABNORMAL LOW (ref 12.0–15.0)
Immature Granulocytes: 0 %
Lymphocytes Relative: 30 %
Lymphs Abs: 1 K/uL (ref 0.7–4.0)
MCH: 31.6 pg (ref 26.0–34.0)
MCHC: 33.7 g/dL (ref 30.0–36.0)
MCV: 93.8 fL (ref 80.0–100.0)
Monocytes Absolute: 0.3 K/uL (ref 0.1–1.0)
Monocytes Relative: 10 %
Neutro Abs: 1.8 K/uL (ref 1.7–7.7)
Neutrophils Relative %: 55 %
Platelets: 175 K/uL (ref 150–400)
RBC: 3.73 MIL/uL — ABNORMAL LOW (ref 3.87–5.11)
RDW: 15 % (ref 11.5–15.5)
WBC: 3.3 K/uL — ABNORMAL LOW (ref 4.0–10.5)
nRBC: 0 % (ref 0.0–0.2)

## 2018-11-25 MED ORDER — HEPARIN SOD (PORK) LOCK FLUSH 100 UNIT/ML IV SOLN
500.0000 [IU] | Freq: Once | INTRAVENOUS | Status: AC
  Administered 2018-11-25: 500 [IU] via INTRAVENOUS

## 2018-11-25 MED ORDER — SODIUM CHLORIDE 0.9% FLUSH
10.0000 mL | Freq: Once | INTRAVENOUS | Status: AC
  Administered 2018-11-25: 10:00:00 10 mL via INTRAVENOUS
  Filled 2018-11-25: qty 10

## 2018-11-25 MED ORDER — INFLUENZA VAC SPLIT QUAD 0.5 ML IM SUSY
0.5000 mL | PREFILLED_SYRINGE | Freq: Once | INTRAMUSCULAR | Status: AC
  Administered 2018-11-25: 12:00:00 0.5 mL via INTRAMUSCULAR

## 2018-12-03 ENCOUNTER — Ambulatory Visit (INDEPENDENT_AMBULATORY_CARE_PROVIDER_SITE_OTHER): Payer: No Typology Code available for payment source | Admitting: Family Medicine

## 2018-12-03 DIAGNOSIS — F419 Anxiety disorder, unspecified: Secondary | ICD-10-CM | POA: Diagnosis not present

## 2018-12-03 DIAGNOSIS — I10 Essential (primary) hypertension: Secondary | ICD-10-CM | POA: Diagnosis not present

## 2018-12-03 DIAGNOSIS — C50312 Malignant neoplasm of lower-inner quadrant of left female breast: Secondary | ICD-10-CM

## 2018-12-03 DIAGNOSIS — R002 Palpitations: Secondary | ICD-10-CM

## 2018-12-03 DIAGNOSIS — F3341 Major depressive disorder, recurrent, in partial remission: Secondary | ICD-10-CM

## 2018-12-03 NOTE — Progress Notes (Signed)
Virtual Visit via Video Note  I connected with Shelby [name] on 12/03/18 at  4:00 PM EST by a video enabled telemedicine application and verified that I am speaking with the correct person using two identifiers.  Location: Patient: Home Provider: Office   I discussed the limitations of evaluation and management by telemedicine and the availability of in person appointments. The patient expressed understanding and agreed to proceed.  History of Present Illness: Patient has had triple negative breast cancer and has had radiation therapy and is now on chemotherapy to try to prophylax against recurrence.  She is followed by Dr. Tollie Pizza and Dr. Grayland Ormond and Dr. Donella Stade for radiation oncology.  Last visit was meet with me was July 2019. She has been doing fairly well and trying to stay safe and Covid.  Her blood pressures have been running a little bit high during radiation therapy about 145/95 but are better between time.  The big reason for this visit is her heart rate a couple of times it been about 125.  Other times she checks it is just a little bit less than 100.  He has no palpitations and no chest pain or chest tightness.  She is not sought evaluation for this and has not had a cardiogram.   Observations/Objective:   Assessment and Plan: 1. Essential hypertension Continue losartan for now.  2. Primary cancer of lower-inner quadrant of left female breast [facility]) Undergoing chemotherapy now.  She will track her blood pressure for the 2 weeks on and then 1 week off of chemo.  3. Palpitations Could be a side effect from her chemotherapy.  May need to cardiogram or cardiology referral.  Would easily add a low-dose beta-blocker in the future if her heart rate stays up or if blood pressure spikes.  4. Anxiety Under fairly good control.  5. Recurrent major depressive disorder, in partial remission (HCC) No changes in meds for now.   Follow Up Instructions:    I discussed the  assessment and treatment plan with the patient. The patient was provided an opportunity to ask questions and all were answered. The patient agreed with the plan and demonstrated an understanding of the instructions.   The patient was advised to call back or seek an in-person evaluation if the symptoms worsen or if the condition fails to improve as anticipated.  I provided 12 minutes of non-face-to-face time during this encounter.   Wilhemena Durie, MD

## 2018-12-04 ENCOUNTER — Encounter: Payer: Self-pay | Admitting: Oncology

## 2018-12-11 MED FILL — LOSARTAN POTASSIUM 50 MG TA: 50 | 30 days supply | Qty: 30 | Fill #2

## 2018-12-11 MED FILL — CAPECITABINE 500 MG TABS: 500 | 21 days supply | Qty: 112 | Fill #2

## 2018-12-14 NOTE — Progress Notes (Deleted)
[place]  [phone] [phone]) [phone]  ID: Shelby [name] OB: 12-Apr-1970  MR#: [id number]  NOB#:[id number]  Patient Care Team: Jerrol Banana., MD as PCP - General (Family Medicine) [name], MD as Consulting Physician (Cardiology)  CHIEF COMPLAINT: Pathologic stage IIa triple negative invasive carcinoma of the lower inner quadrant of the left breast.  INTERVAL HISTORY: Patient returns to clinic today for further evaluation and to assess her toleration of cycle 1 of Xeloda.  She had significant weakness and fatigue with some mild nausea.  She also had burning pain on her feet that has since resolved. She denies any recent fevers or illnesses.  She has no neurologic complaints.  She has a good appetite and denies weight loss.  She denies any chest pain, shortness of breath, cough, or hemoptysis.  She denies any nausea, vomiting, constipation, or diarrhea.  She has no urinary complaints.  Patient offers no further specific complaints today.  REVIEW OF SYSTEMS:   Review of Systems  Constitutional: Positive for malaise/fatigue. Negative for fever and weight loss.  HENT: Negative.  Negative for congestion, ear pain and sore throat.   Respiratory: Negative.  Negative for cough, hemoptysis and shortness of breath.   Cardiovascular: Negative.  Negative for chest pain and leg swelling.  Gastrointestinal: Positive for nausea. Negative for abdominal pain and melena.  Genitourinary: Negative.  Negative for dysuria.  Musculoskeletal: Negative.  Negative for back pain.  Skin: Negative.  Negative for rash.  Neurological: Positive for weakness. Negative for dizziness, focal weakness and headaches.  Psychiatric/Behavioral: The patient is nervous/anxious.     As per HPI. Otherwise, a complete review of systems is negative.  PAST MEDICAL HISTORY: Past Medical History:  Diagnosis Date  . Anemia    h/o with pregnancy  . Anxiety   . Asthma    allergy induced-no inhaler  . Cancer [facility])    breast left   . Complication of anesthesia   . Environmental allergies   . Family history of adverse reaction to anesthesia    son-breathing problems-coded 1st time when he was 2 and had another surgery at 6 and had to be admitted for breathing problems  . Family history of breast cancer   . GERD (gastroesophageal reflux disease)   . Headache    migraines  . Hypertension   . Mitral valve disease   . Mitral valve prolapse   . Painful menstrual periods   . PONV (postoperative nausea and vomiting)   . Vertigo     PAST SURGICAL HISTORY: Past Surgical History:  Procedure Laterality Date  . ABDOMINAL HYSTERECTOMY  2010   supracervical   . AXILLARY LYMPH NODE DISSECTION Left 07/12/2018   Procedure: AXILLARY LYMPH NODE DISSECTION;  Surgeon: Robert Bellow, MD;  Location: [facility] ORS;  Service: General;  Laterality: Left;  . BREAST BIOPSY Left 11/2017   invasive ductal carcinoma and metastatic LN  . BREAST BIOPSY WITH SENTINEL LYMPH NODE BIOPSY AND NEEDLE LOCALIZATION Left 07/12/2018   Procedure: BREAST BIOPSY WIDE EXCISION WITH SENTINEL NODE  AND NEEDLE LOCALIZATION OF OXILLARY NODS LEFT;  Surgeon: Robert Bellow, MD;  Location: [facility] ORS;  Service: General;  Laterality: Left;  . BUNIONECTOMY Bilateral   . MANDIBLE RECONSTRUCTION     age 48-underbite  . PORTACATH PLACEMENT Right 12/31/2017   Procedure: INSERTION PORT-A-CATH;  Surgeon: Robert Bellow, MD;  Location: [facility] ORS;  Service: General;  Laterality: Right;  . RE-EXCISION OF BREAST LUMPECTOMY Left 07/24/2018  Procedure: RE-EXCISION OF BREAST LUMPECTOMY LEFT;  Surgeon: Robert Bellow, MD;  Location: [facility] ORS;  Service: General;  Laterality: Left;  . SHOULDER ARTHROSCOPY WITH OPEN ROTATOR CUFF REPAIR Right 04/26/2017   Procedure: SHOULDER ARTHROSCOPY WITH OPEN ROTATOR CUFF REPAIR,SUBACROMINAL DECOMPRESSION;  Surgeon: Thornton Park, MD;  Location: [facility] ORS;  Service:  Orthopedics;  Laterality: Right;  . TONSILLECTOMY      FAMILY HISTORY: Family History  Problem Relation Age of Onset  . Breast cancer Mother 78  . Diabetes Father   . Cancer Maternal Uncle        colon  . Breast cancer Maternal Grandmother 25    ADVANCED DIRECTIVES (Y/N):  N  HEALTH MAINTENANCE: Social History   Tobacco Use  . Smoking status: Former Smoker    Packs/day: 0.50    Years: 10.00    Pack years: 5.00    Types: Cigarettes    Quit date: 04/20/2007    Years since quitting: 11.6  . Smokeless tobacco: Never Used  . Tobacco comment: social smoker back then  Substance Use Topics  . Alcohol use: Yes    Comment: occas  . Drug use: No     Colonoscopy:  PAP:  Bone density:  Lipid panel:  Allergies  Allergen Reactions  . Hydrocodone Hives    Swollen face - many years ago. Thinks she has tolerated since.  . Iodine Hives  . Peanut Butter Flavor Other (See Comments)    Made mouth tingle  . Shellfish Allergy Hives    Current Outpatient Medications  Medication Sig Dispense Refill  . ALPRAZolam (XANAX) 0.5 MG tablet Take 0.5-1 tablets (0.25-0.5 mg total) by mouth 2 (two) times daily as needed for anxiety. (Patient not taking: Reported on 11/22/2018) 60 tablet 0  . capecitabine (XELODA) 500 MG tablet Take 4 tablets (2,000 mg total) by mouth 2 (two) times daily after a meal. Take for 14 days, then hold for 7 days. Repeat every 21 days. 112 tablet 4  . Cholecalciferol (VITAMIN D) 50 MCG (2000 UT) CAPS Take 2,000 Units by mouth daily.    Marland Kitchen EPINEPHrine 0.3 mg/0.3 mL IJ SOAJ injection Inject 0.3 mLs (0.3 mg total) into the muscle once. (Patient not taking: Reported on 11/08/2018) 1 Device 12  . famotidine (PEPCID) 20 MG tablet Take 20 mg by mouth daily.   0  . ibuprofen (ADVIL,MOTRIN) 200 MG tablet Take 200 mg by mouth daily as needed for mild pain.     Marland Kitchen losartan (COZAAR) 50 MG tablet TAKE 1 TABLET BY MOUTH DAILY 30 tablet 1  . mometasone (NASONEX) 50 MCG/ACT nasal spray  Place 2 sprays into the nose daily. (Patient taking differently: Place 2 sprays into the nose daily as needed (allergies). ) 17 g 12  . montelukast (SINGULAIR) 10 MG tablet TAKE 1 TABLET BY MOUTH AT BEDTIME (Patient taking differently: Take 10 mg by mouth daily as needed (allergies). ) 30 tablet 12  . Omega-3 1000 MG CAPS Take by mouth daily.     No current facility-administered medications for this visit.     OBJECTIVE: There were no vitals filed for this visit.   There is no height or weight on file to calculate BMI.    ECOG FS:0 - Asymptomatic  General: Well-developed, well-nourished, no acute distress. Eyes: Pink conjunctiva, anicteric sclera. HEENT: Normocephalic, moist mucous membranes. Lungs: Clear to auscultation bilaterally. Heart: Regular rate and rhythm. No rubs, murmurs, or gallops. Abdomen: Soft, nontender, nondistended. No organomegaly noted, normoactive bowel sounds. Musculoskeletal: No edema,  cyanosis, or clubbing. Neuro: Alert, answering all questions appropriately. Cranial nerves grossly intact. Skin: No rashes or petechiae noted. Psych: Normal affect.  LAB RESULTS:  Lab Results  Component Value Date   NA 138 10/09/2018   K 4.0 10/09/2018   CL 101 10/09/2018   CO2 24 10/09/2018   GLUCOSE 112 (H) 10/09/2018   BUN 20 10/09/2018   CREATININE 0.59 10/09/2018   CALCIUM 10.1 10/09/2018   PROT 7.2 10/09/2018   ALBUMIN 4.5 10/09/2018   AST 26 10/09/2018   ALT 22 10/09/2018   ALKPHOS 55 10/09/2018   BILITOT 0.6 10/09/2018   GFRNONAA >60 10/09/2018   GFRAA >60 10/09/2018    Lab Results  Component Value Date   WBC 3.3 (L) 11/25/2018   NEUTROABS 1.8 11/25/2018   HGB 11.8 (L) 11/25/2018   HCT 35.0 (L) 11/25/2018   MCV 93.8 11/25/2018   PLT 175 11/25/2018     STUDIES: No results found.  ASSESSMENT: Pathologic stage IIa triple negative invasive carcinoma of the lower inner quadrant of the left breast.  PLAN:    1.  Pathologic stage IIa triple negative  invasive carcinoma of the lower inner quadrant of the left breast: CT scan results from January 01, 2018 did not reveal any metastatic disease.  Patient completed her neoadjuvant chemotherapy with Adriamycin and Cytoxan, followed by carboplatinum and Taxol, on Jun 20, 2018.  Her initial resection was completed on July 12, 2018 and then she underwent reexcision for positive margins on July 24, 2018.  Patient completed adjuvant XRT on October 16, 2018.  She wishes to pursue adjuvant capecitabine 1250 mg/m twice daily for 14 days with 7 days off.  Plan to do 6-8 cycles depending on toxicity.  She has completed cycle 1 of capecitabine.  Given the side effects listed above patient may want to consider dose reduction in the future, but patient will proceed with cycle 2 as planned today.  Return to clinic in 3 weeks for further evaluation and consideration of cycle 3. 2.  Anxiety: Chronic.  Continue Xanax as needed. 3.  Anemia: Mild, monitor. 3.  Leukopenia: Mild, monitor. 4.  Foot pain: Possibly hand-foot syndrome secondary to capecitabine.  Continue topical treatments as directed. 5.  Nausea: Continue antiemetics as prescribed.  I spent a total of 30 minutes face-to-face with the patient of which greater than 50% of the visit was spent in counseling and coordination of care as detailed above.   Patient expressed understanding and was in agreement with this plan. She also understands that She can call clinic at any time with any questions, concerns, or complaints.   Cancer Staging Primary cancer of lower-inner quadrant of left female breast [facility]) Staging form: Breast, AJCC 8th Edition - Clinical stage from 12/24/2017: Stage IIIB (cT2, cN1, cM0, G3, ER-, PR-, HER2-) - Signed by Lloyd Huger, MD on 12/24/2017 - Pathologic stage from 08/01/2018: No Stage Recommended (ypT1c, pN1a, cM0, ER-, PR-, HER2-) - Signed by Lloyd Huger, MD on 08/01/2018   Lloyd Huger, MD   12/14/2018 7:34 AM

## 2018-12-16 ENCOUNTER — Other Ambulatory Visit: Payer: Self-pay

## 2018-12-16 ENCOUNTER — Inpatient Hospital Stay: Payer: No Typology Code available for payment source

## 2018-12-16 ENCOUNTER — Encounter: Payer: Self-pay | Admitting: Oncology

## 2018-12-16 ENCOUNTER — Inpatient Hospital Stay: Payer: No Typology Code available for payment source | Attending: Oncology | Admitting: Oncology

## 2018-12-16 ENCOUNTER — Telehealth: Payer: Self-pay | Admitting: *Deleted

## 2018-12-16 VITALS — BP 122/88 | HR 105 | Temp 99.5°F | Resp 18 | Wt 146.1 lb

## 2018-12-16 DIAGNOSIS — R530 Neoplastic (malignant) related fatigue: Secondary | ICD-10-CM | POA: Diagnosis not present

## 2018-12-16 DIAGNOSIS — D649 Anemia, unspecified: Secondary | ICD-10-CM | POA: Diagnosis not present

## 2018-12-16 DIAGNOSIS — D696 Thrombocytopenia, unspecified: Secondary | ICD-10-CM | POA: Diagnosis not present

## 2018-12-16 DIAGNOSIS — C50312 Malignant neoplasm of lower-inner quadrant of left female breast: Secondary | ICD-10-CM | POA: Insufficient documentation

## 2018-12-16 DIAGNOSIS — R7401 Elevation of levels of liver transaminase levels: Secondary | ICD-10-CM | POA: Insufficient documentation

## 2018-12-16 DIAGNOSIS — Z87891 Personal history of nicotine dependence: Secondary | ICD-10-CM | POA: Diagnosis not present

## 2018-12-16 DIAGNOSIS — E871 Hypo-osmolality and hyponatremia: Secondary | ICD-10-CM | POA: Diagnosis not present

## 2018-12-16 DIAGNOSIS — R531 Weakness: Secondary | ICD-10-CM | POA: Insufficient documentation

## 2018-12-16 DIAGNOSIS — E860 Dehydration: Secondary | ICD-10-CM | POA: Diagnosis not present

## 2018-12-16 DIAGNOSIS — L271 Localized skin eruption due to drugs and medicaments taken internally: Secondary | ICD-10-CM | POA: Diagnosis not present

## 2018-12-16 DIAGNOSIS — Z5189 Encounter for other specified aftercare: Secondary | ICD-10-CM

## 2018-12-16 DIAGNOSIS — R5383 Other fatigue: Secondary | ICD-10-CM

## 2018-12-16 LAB — CBC WITH DIFFERENTIAL/PLATELET
Abs Immature Granulocytes: 0.01 10*3/uL (ref 0.00–0.07)
Basophils Absolute: 0 10*3/uL (ref 0.0–0.1)
Basophils Relative: 0 %
Eosinophils Absolute: 0 10*3/uL (ref 0.0–0.5)
Eosinophils Relative: 1 %
HCT: 33.1 % — ABNORMAL LOW (ref 36.0–46.0)
Hemoglobin: 11.4 g/dL — ABNORMAL LOW (ref 12.0–15.0)
Immature Granulocytes: 0 %
Lymphocytes Relative: 15 %
Lymphs Abs: 0.7 10*3/uL (ref 0.7–4.0)
MCH: 32.8 pg (ref 26.0–34.0)
MCHC: 34.4 g/dL (ref 30.0–36.0)
MCV: 95.1 fL (ref 80.0–100.0)
Monocytes Absolute: 0.5 10*3/uL (ref 0.1–1.0)
Monocytes Relative: 11 %
Neutro Abs: 3.1 10*3/uL (ref 1.7–7.7)
Neutrophils Relative %: 73 %
Platelets: 60 10*3/uL — ABNORMAL LOW (ref 150–400)
RBC: 3.48 MIL/uL — ABNORMAL LOW (ref 3.87–5.11)
RDW: 16.8 % — ABNORMAL HIGH (ref 11.5–15.5)
WBC: 4.2 10*3/uL (ref 4.0–10.5)
nRBC: 0 % (ref 0.0–0.2)

## 2018-12-16 LAB — COMPREHENSIVE METABOLIC PANEL
ALT: 80 U/L — ABNORMAL HIGH (ref 0–44)
AST: 139 U/L — ABNORMAL HIGH (ref 15–41)
Albumin: 3.8 g/dL (ref 3.5–5.0)
Alkaline Phosphatase: 112 U/L (ref 38–126)
Anion gap: 7 (ref 5–15)
BUN: 14 mg/dL (ref 6–20)
CO2: 27 mmol/L (ref 22–32)
Calcium: 9.1 mg/dL (ref 8.9–10.3)
Chloride: 99 mmol/L (ref 98–111)
Creatinine, Ser: 0.65 mg/dL (ref 0.44–1.00)
GFR calc Af Amer: >60 mL/min (ref >60)
GFR calc non Af Amer: >60 mL/min (ref >60)
Glucose, Bld: 143 mg/dL — ABNORMAL HIGH (ref 70–99)
Potassium: 3.9 mmol/L (ref 3.5–5.1)
Sodium: 133 mmol/L — ABNORMAL LOW (ref 135–145)
Total Bilirubin: 1 mg/dL (ref 0.3–1.2)
Total Protein: 7.1 g/dL (ref 6.5–8.1)

## 2018-12-16 LAB — SAMPLE TO BLOOD BANK

## 2018-12-16 NOTE — Progress Notes (Signed)
Symptom Management Consult note [facility]  [phone] [phone]  Patient Care Team: Jerrol Banana., MD as PCP - General (Family Medicine) Minna Merritts, MD as Consulting Physician (Cardiology)   Name of the [name]  [id number]  Mar 07, 1970   Date of visit: 12/16/2018   Diagnosis-triple negative breast cancer  Chief complaint/ Reason for visit-generalized weakness/fatigue  Heme/Onc history:  Oncology History Overview Note  Initially seen by nurse midwife Melody Gayla Medicus at gynecologist office for left tender breast mass.  Had breast ultrasound and and mammogram revealing suspicious palpable mass of left breast requiring biopsy.  Biopsy revealed grade 3 invasive ductal carcinoma.  Positive metastatic carcinoma and left axillary lymph node.  Met with Dr. Grayland Ormond on 12/24/2017 where he recommended neoadjuvant chemotherapy using Adriamycin, Cytoxan with Neulasta support followed by carbo/Taxol.  XRT may not be needed given patient would like mastectomy of left breast.   Had port placed by Dr. Bary Castilla on 12/31/2017.  CT abdomen/pelvis/chest with contrast on 01/02/2018 did not reveal metastatic disease.  Pretreatment MUGA revealed an EF of 71%.    Primary cancer of lower-inner quadrant of left female breast ([place])  12/23/2017 Initial Diagnosis   Primary cancer of lower-inner quadrant of left female breast ([place])   12/24/2017 Cancer Staging   Staging form: Breast, AJCC 8th Edition - Clinical stage from 12/24/2017: Stage IIIB (cT2, cN1, cM0, G3, ER-, PR-, HER2-) - Signed by Lloyd Huger, MD on 12/24/2017   01/03/2018 - 06/26/2018 Chemotherapy   The patient had DOXOrubicin (ADRIAMYCIN) chemo injection 104 mg, 60 mg/m2 = 104 mg, Intravenous,  Once, 4 of 4 cycles Administration: 104 mg (01/03/2018), 104 mg (01/17/2018), 104 mg (01/31/2018), 104 mg (02/14/2018) palonosetron (ALOXI) injection 0.25 mg, 0.25 mg,  Intravenous,  Once, 8 of 8 cycles Administration: 0.25 mg (01/03/2018), 0.25 mg (02/28/2018), 0.25 mg (01/17/2018), 0.25 mg (03/21/2018), 0.25 mg (01/31/2018), 0.25 mg (02/14/2018), 0.25 mg (05/02/2018), 0.25 mg (05/30/2018) pegfilgrastim-cbqv (UDENYCA) injection 6 mg, 6 mg, Subcutaneous, Once, 6 of 6 cycles Administration: 6 mg (01/04/2018), 6 mg (01/18/2018), 6 mg (02/01/2018), 6 mg (02/15/2018), 6 mg (05/31/2018) CARBOplatin (PARAPLATIN) 680 mg in sodium chloride 0.9 % 250 mL chemo infusion, 680 mg (100 % of original dose 676.2 mg), Intravenous,  Once, 4 of 4 cycles Dose modification:   (original dose 676.2 mg, Cycle 5) Administration: 680 mg (02/28/2018), 680 mg (03/21/2018), 680 mg (05/02/2018), 680 mg (05/30/2018) cyclophosphamide (CYTOXAN) 1,000 mg in sodium chloride 0.9 % 250 mL chemo infusion, 1,040 mg, Intravenous,  Once, 4 of 4 cycles Administration: 1,000 mg (01/03/2018), 1,000 mg (01/17/2018), 1,000 mg (01/31/2018), 1,000 mg (02/14/2018) PACLitaxel (TAXOL) 138 mg in sodium chloride 0.9 % 250 mL chemo infusion (</= 6m/m2), 80 mg/m2 = 138 mg, Intravenous,  Once, 4 of 4 cycles Administration: 138 mg (02/28/2018), 138 mg (03/07/2018), 138 mg (03/14/2018), 138 mg (03/21/2018), 138 mg (04/04/2018), 138 mg (04/18/2018), 138 mg (05/02/2018), 138 mg (05/16/2018), 138 mg (05/23/2018), 138 mg (05/30/2018), 138 mg (06/13/2018), 138 mg (06/20/2018) fosaprepitant (EMEND) 150 mg, dexamethasone (DECADRON) 12 mg in sodium chloride 0.9 % 145 mL IVPB, , Intravenous,  Once, 8 of 8 cycles Administration:  (01/03/2018),  (02/28/2018),  (01/17/2018),  (03/21/2018),  (01/31/2018),  (02/14/2018),  (05/02/2018),  (05/30/2018)  for chemotherapy treatment.    08/01/2018 Cancer Staging   Staging form: Breast, AJCC 8th Edition - Pathologic stage from 08/01/2018: No Stage Recommended (ypT1c, pN1a, cM0, ER-, PR-, HER2-) - Signed by FLloyd Huger MD on  08/01/2018   10/09/2018 -  Chemotherapy   The patient had [No matching medication found in this treatment  plan]  for chemotherapy treatment.     Interval history-patient presents to symptom management today for complaints of worsening fatigue and generalized weakness since beginning capecitabine.  She is currently on her week off.  She is scheduled to return to clinic on Thursday for labs and cycle 3 of capecitabine.  Has tolerated fair with mild side effects; hand-foot syndrome that appears to be improving and bone marrow suppresion. She has been applying Eucerin cream to both hands and feet with improvement.  She denies any pain today.  Generally does not have an appetite but is able to eat a fairly large dinner.  She drinks water throughout the day.  She denies any recent fevers or illness, chest pain, nausea, vomiting or constipation.  She has not had any diarrhea.   ECOG FS:1 - Symptomatic but completely ambulatory  Review of systems- Review of Systems  Constitutional: Positive for malaise/fatigue. Negative for chills, fever and weight loss.  HENT: Negative for congestion, ear pain and tinnitus.   Eyes: Negative.  Negative for blurred vision and double vision.  Respiratory: Negative.  Negative for cough, sputum production and shortness of breath.   Cardiovascular: Negative.  Negative for chest pain, palpitations and leg swelling.  Gastrointestinal: Negative.  Negative for abdominal pain, constipation, diarrhea, nausea and vomiting.  Genitourinary: Negative for dysuria, frequency and urgency.  Musculoskeletal: Negative for back pain and falls.  Skin: Negative.  Negative for rash.       Hand-foot syndrome-improving  Neurological: Positive for dizziness and weakness. Negative for headaches.  Endo/Heme/Allergies: Negative.  Does not bruise/bleed easily.  Psychiatric/Behavioral: Negative.  Negative for depression. The patient is not nervous/anxious and does not have insomnia.      Current treatment-status post 2 cycles capecitabine  Allergies  Allergen Reactions   Hydrocodone Hives     Swollen face - many years ago. Thinks she has tolerated since.   Iodine Hives   Peanut Butter Flavor Other (See Comments)    Made mouth tingle   Shellfish Allergy Hives     Past Medical History:  Diagnosis Date   Anemia    h/o with pregnancy   Anxiety    Asthma    allergy induced-no inhaler   Cancer ([place])    breast left    Complication of anesthesia    Environmental allergies    Family history of adverse reaction to anesthesia    son-breathing problems-coded 1st time when he was 2 and had another surgery at 45 and had to be admitted for breathing problems   Family history of breast cancer    GERD (gastroesophageal reflux disease)    Headache    migraines   Hypertension    Mitral valve disease    Mitral valve prolapse    Painful menstrual periods    PONV (postoperative nausea and vomiting)    Vertigo      Past Surgical History:  Procedure Laterality Date   ABDOMINAL HYSTERECTOMY  2010   supracervical    AXILLARY LYMPH NODE DISSECTION Left 07/12/2018   Procedure: AXILLARY LYMPH NODE DISSECTION;  Surgeon: Robert Bellow, MD;  Location: [facility] ORS;  Service: General;  Laterality: Left;   BREAST BIOPSY Left 11/2017   invasive ductal carcinoma and metastatic LN   BREAST BIOPSY WITH SENTINEL LYMPH NODE BIOPSY AND NEEDLE LOCALIZATION Left 07/12/2018   Procedure: BREAST BIOPSY WIDE EXCISION WITH SENTINEL NODE  AND NEEDLE LOCALIZATION OF [name] LEFT;  Surgeon: Robert Bellow, MD;  Location: [facility] ORS;  Service: General;  Laterality: Left;   BUNIONECTOMY Bilateral    MANDIBLE RECONSTRUCTION     age 23-underbite   PORTACATH PLACEMENT Right 12/31/2017   Procedure: INSERTION PORT-A-CATH;  Surgeon: Robert Bellow, MD;  Location: [facility] ORS;  Service: General;  Laterality: Right;   RE-EXCISION OF BREAST LUMPECTOMY Left 07/24/2018   Procedure: RE-EXCISION OF BREAST LUMPECTOMY LEFT;  Surgeon: Robert Bellow, MD;  Location: [facility] ORS;  Service:  General;  Laterality: Left;   SHOULDER ARTHROSCOPY WITH OPEN ROTATOR CUFF REPAIR Right 04/26/2017   Procedure: SHOULDER ARTHROSCOPY WITH OPEN ROTATOR CUFF REPAIR,SUBACROMINAL DECOMPRESSION;  Surgeon: Thornton Park, MD;  Location: [facility] ORS;  Service: Orthopedics;  Laterality: Right;   TONSILLECTOMY      Social History   Socioeconomic History   Marital status: Divorced    Spouse name: Not on file   Number of children: Not on file   Years of education: Not on file   Highest education level: Not on file  Occupational History   Not on file  Social Needs   Financial resource strain: Not on file   Food insecurity    Worry: Not on file    Inability: Not on file   Transportation needs    Medical: Not on file    Non-medical: Not on file  Tobacco Use   Smoking status: Former Smoker    Packs/day: 0.50    Years: 10.00    Pack years: 5.00    Types: Cigarettes    Quit date: 04/20/2007    Years since quitting: 11.6   Smokeless tobacco: Never Used   Tobacco comment: social smoker back then  Substance and Sexual Activity   Alcohol use: Yes    Comment: occas   Drug use: No   Sexual activity: Yes  Lifestyle   Physical activity    Days per week: Not on file    Minutes per session: Not on file   Stress: Not on file  Relationships   Social connections    Talks on phone: Not on file    Gets together: Not on file    Attends religious service: Not on file    Active member of club or organization: Not on file    Attends meetings of clubs or organizations: Not on file    Relationship status: Not on file   Intimate partner violence    Fear of current or ex partner: Not on file    Emotionally abused: Not on file    Physically abused: Not on file    Forced sexual activity: Not on file  Other Topics Concern   Not on file  Social History Narrative   Not on file    Family History  Problem Relation Age of Onset   Breast cancer Mother 31   Diabetes Father     Cancer Maternal Uncle        colon   Breast cancer Maternal Grandmother 74     Current Outpatient Medications:    capecitabine (XELODA) 500 MG tablet, Take 4 tablets (2,000 mg total) by mouth 2 (two) times daily after a meal. Take for 14 days, then hold for 7 days. Repeat every 21 days., Disp: 112 tablet, Rfl: 4   Cholecalciferol (VITAMIN D) 50 MCG (2000 UT) CAPS, Take 2,000 Units by mouth daily., Disp: , Rfl:    famotidine (PEPCID) 20 MG tablet, Take 20 mg by mouth  daily. , Disp: , Rfl: 0   ibuprofen (ADVIL,MOTRIN) 200 MG tablet, Take 200 mg by mouth daily as needed for mild pain. , Disp: , Rfl:    losartan (COZAAR) 50 MG tablet, TAKE 1 TABLET BY MOUTH DAILY, Disp: 30 tablet, Rfl: 1   mometasone (NASONEX) 50 MCG/ACT nasal spray, Place 2 sprays into the nose daily. (Patient taking differently: Place 2 sprays into the nose daily as needed (allergies). ), Disp: 17 g, Rfl: 12   montelukast (SINGULAIR) 10 MG tablet, TAKE 1 TABLET BY MOUTH AT BEDTIME (Patient taking differently: Take 10 mg by mouth daily as needed (allergies). ), Disp: 30 tablet, Rfl: 12   Omega-3 1000 MG CAPS, Take by mouth daily., Disp: , Rfl:    ALPRAZolam (XANAX) 0.5 MG tablet, Take 0.5-1 tablets (0.25-0.5 mg total) by mouth 2 (two) times daily as needed for anxiety. (Patient not taking: Reported on 11/22/2018), Disp: 60 tablet, Rfl: 0   EPINEPHrine 0.3 mg/0.3 mL IJ SOAJ injection, Inject 0.3 mLs (0.3 mg total) into the muscle once. (Patient not taking: Reported on 11/08/2018), Disp: 1 Device, Rfl: 12  Physical exam:  Vitals:   12/16/18 1339  BP: 122/88  Pulse: (!) 105  Resp: 18  Temp: 99.5 F (37.5 C)  TempSrc: Tympanic  SpO2: 100%  Weight: 146 lb 1.6 oz (66.3 kg)   Physical Exam Constitutional:      Appearance: Normal appearance.  HENT:     Head: Normocephalic and atraumatic.  Eyes:     Pupils: Pupils are equal, round, and reactive to light.  Neck:     Musculoskeletal: Normal range of motion.   Cardiovascular:     Rate and Rhythm: Normal rate and regular rhythm.     Heart sounds: Normal heart sounds. No murmur.  Pulmonary:     Effort: Pulmonary effort is normal.     Breath sounds: Normal breath sounds. No wheezing.  Abdominal:     General: Bowel sounds are normal. There is no distension.     Palpations: Abdomen is soft.     Tenderness: There is no abdominal tenderness.  Musculoskeletal: Normal range of motion.  Skin:    General: Skin is warm and dry.     Findings: Erythema (hands and feet) present. No rash.  Neurological:     Mental Status: She is alert and oriented to person, place, and time.  Psychiatric:        Judgment: Judgment normal.      CMP Latest Ref Rng & Units 12/16/2018  Glucose 70 - 99 mg/dL 143(H)  BUN 6 - 20 mg/dL 14  Creatinine 0.44 - 1.00 mg/dL 0.65  Sodium 135 - 145 mmol/L 133(L)  Potassium 3.5 - 5.1 mmol/L 3.9  Chloride 98 - 111 mmol/L 99  CO2 22 - 32 mmol/L 27  Calcium 8.9 - 10.3 mg/dL 9.1  Total Protein 6.5 - 8.1 g/dL 7.1  Total Bilirubin 0.3 - 1.2 mg/dL 1.0  Alkaline Phos 38 - 126 U/L 112  AST 15 - 41 U/L 139(H)  ALT 0 - 44 U/L 80(H)   CBC Latest Ref Rng & Units 12/16/2018  WBC 4.0 - 10.5 K/uL 4.2  Hemoglobin 12.0 - 15.0 g/dL 11.4(L)  Hematocrit 36.0 - 46.0 % 33.1(L)  Platelets 150 - 400 K/uL 60(L)    No images are attached to the encounter.  No results found.  Assessment and plan- Patient is a 48 y.o. female who presents to symptom management for complaints of generalized weakness, malaise and fatigue.  Symptoms have been progressive since beginning capecitabine.  She is status post 2 cycles currently on her week off.  Scheduled to restart on Thursday.  Triple negative breast cancer: Pathological stage IIa triple negative invasive carcinoma of the lower inner quadrant of left breast now status post neoadjuvant chemotherapy with Adriamycin and Cytoxan followed by carbotaxol.  Initial resection on 07/12/2018 was followed by reexcision  for positive margins on 07/24/2018.  She completed adjuvant XRT on 10/16/2018 and currently is receiving adjuvant Xeloda.  She is status post 2 cycles and is scheduled to begin cycle 3 next week.  Generalized weakness/fatigue: related to Xeloda. Will check labs.  She is currently status post 2 cycles of full dose capecitabine.  She will likely need a dose reduction for future cycles.  She is interested in continuing her treatments at this time.  Will discuss with Dr. Grayland Ormond on Thursday.  Palmer-plantar erythrodysesthesia: Currently improving.  Provided her with additional urea cream.  Thrombocytopenia/anemia: Likely related to current treatment.  Continue to monitor at this time.  Given her platelet count 61,000 today, she will likely need an additional week off to improve her counts.  Transaminitis: No abdominal pain.  Likely related to treatment.  We will recheck on Thursday.  Plan: Stat labs. (Mild hyponatremia) Vital signs.  Stable. Discussed hydration.  Samples of Ensure rapid hydration electrolyte powder provided for patient.  Disposition: Return to clinic as scheduled on 12/18/2018 for additional lab work and assessment prior to cycle 3 capecitabine.  Visit Diagnosis 1. Dehydration   2. Other fatigue   3. Weakness     Patient expressed understanding and was in agreement with this plan. She also understands that She can call clinic at any time with any questions, concerns, or complaints.   Greater than 50% was spent in counseling and coordination of care with this patient including but not limited to discussion of the relevant topics above (See A&P) including, but not limited to diagnosis and management of acute and chronic medical conditions.   Thank you for allowing me to participate in the care of this very pleasant patient.    Jacquelin Hawking, NP [place] [facility] Cell - [phone] Pager- [phone] 12/16/2018 2:57 PM

## 2018-12-16 NOTE — Telephone Encounter (Signed)
[facility] please 

## 2018-12-16 NOTE — Progress Notes (Signed)
Patient is feeling very weak, fatigued, she has been lightheaded. Very short of breath. Had bloody nose this morning. Abdomen is bothering her today 3/10 pain scale and muscle spasms in her back.

## 2018-12-16 NOTE — Telephone Encounter (Signed)
Patient called reporting that she is not feeling well and needs to be seen before her Thursday appointment. She states she thinks she is anemic, having shortness of breath and no energy. She also reports she had a significant nose bleed this morning. She states her Capecitabine in on hold due to hand foot syndrome. Please advise

## 2018-12-16 NOTE — Telephone Encounter (Signed)
Can she come in tomorrow morning? If not she come in this afternoon for labs at like 1pm. Let me know.   Faythe Casa, NP 12/16/2018 10:31 AM

## 2018-12-16 NOTE — Telephone Encounter (Signed)
Called pt said she needs to be seen today. Please advise.

## 2018-12-17 ENCOUNTER — Telehealth: Payer: Self-pay | Admitting: Family Medicine

## 2018-12-17 ENCOUNTER — Ambulatory Visit: Payer: Self-pay

## 2018-12-17 ENCOUNTER — Ambulatory Visit (INDEPENDENT_AMBULATORY_CARE_PROVIDER_SITE_OTHER): Payer: No Typology Code available for payment source | Admitting: Family Medicine

## 2018-12-17 ENCOUNTER — Encounter: Payer: Self-pay | Admitting: Oncology

## 2018-12-17 DIAGNOSIS — R1013 Epigastric pain: Secondary | ICD-10-CM | POA: Diagnosis not present

## 2018-12-17 DIAGNOSIS — C50312 Malignant neoplasm of lower-inner quadrant of left female breast: Secondary | ICD-10-CM

## 2018-12-17 DIAGNOSIS — F3341 Major depressive disorder, recurrent, in partial remission: Secondary | ICD-10-CM | POA: Diagnosis not present

## 2018-12-17 MED ORDER — SUCRALFATE 1 G PO TABS: 1.0000 g | ORAL_TABLET | Freq: Three times a day (TID) | ORAL | 3 refills | Status: AC

## 2018-12-17 MED ORDER — OMEPRAZOLE 20 MG PO CPDR: 20.0000 mg | DELAYED_RELEASE_CAPSULE | Freq: Every day | ORAL | 3 refills | Status: DC

## 2018-12-17 NOTE — Telephone Encounter (Signed)
From [facility], [place] Patient has Doxy.me appointment at 1 pm

## 2018-12-17 NOTE — Progress Notes (Signed)
[name] Female    DOB: [birth year]   48 y.o.   MRN: [id number] Visit Date: 12/17/2018  Today's Provider: Wilhemena Durie, MD   No chief complaint on file.  Subjective:     Virtual Visit via Video Note  I connected with Shelby [name] on 12/17/18 at  1:20 PM EST by a video enabled telemedicine application and verified that I am speaking with the correct person using two identifiers.  Location: Patient: Home Provider: Office   HPI  Patient with breast cancer is undergoing chemotherapy and has had a couple of days of progressive epigastric pain.  She has had nausea the pain is fairly severe.  She thinks she has an ulcer.  She thinks that is a side effect of her chemotherapy.  She is having no syncope no chest pain no shortness of breath no diaphoresis no melena or hematochezia.  She has had a fever up to 100 she says. Patient is especially stressed out as her mother is now undergoing evaluation for lung cancer  Allergies  Allergen Reactions  . Hydrocodone Hives    Swollen face - many years ago. Thinks she has tolerated since.  . Iodine Hives  . Peanut Butter Flavor Other (See Comments)    Made mouth tingle  . Shellfish Allergy Hives     Current Outpatient Medications:  .  ALPRAZolam (XANAX) 0.5 MG tablet, Take 0.5-1 tablets (0.25-0.5 mg total) by mouth 2 (two) times daily as needed for anxiety. (Patient not taking: Reported on 11/22/2018), Disp: 60 tablet, Rfl: 0 .  capecitabine (XELODA) 500 MG tablet, Take 4 tablets (2,000 mg total) by mouth 2 (two) times daily after a meal. Take for 14 days, then hold for 7 days. Repeat every 21 days., Disp: 112 tablet, Rfl: 4 .  Cholecalciferol (VITAMIN D) 50 MCG (2000 UT) CAPS, Take 2,000 Units by mouth daily., Disp: , Rfl:  .  EPINEPHrine 0.3 mg/0.3 mL IJ SOAJ injection, Inject 0.3 mLs (0.3 mg total) into the muscle once. (Patient not taking: Reported on 11/08/2018), Disp: 1 Device, Rfl: 12 .  famotidine  (PEPCID) 20 MG tablet, Take 20 mg by mouth daily. , Disp: , Rfl: 0 .  ibuprofen (ADVIL,MOTRIN) 200 MG tablet, Take 200 mg by mouth daily as needed for mild pain. , Disp: , Rfl:  .  losartan (COZAAR) 50 MG tablet, TAKE 1 TABLET BY MOUTH DAILY, Disp: 30 tablet, Rfl: 1 .  mometasone (NASONEX) 50 MCG/ACT nasal spray, Place 2 sprays into the nose daily. (Patient taking differently: Place 2 sprays into the nose daily as needed (allergies). ), Disp: 17 g, Rfl: 12 .  montelukast (SINGULAIR) 10 MG tablet, TAKE 1 TABLET BY MOUTH AT BEDTIME (Patient taking differently: Take 10 mg by mouth daily as needed (allergies). ), Disp: 30 tablet, Rfl: 12 .  Omega-3 1000 MG CAPS, Take by mouth daily., Disp: , Rfl:   Review of Systems  Constitutional: Negative for appetite change, chills, fatigue and fever.  Respiratory: Negative for chest tightness and shortness of breath.   Cardiovascular: Negative for chest pain and palpitations.  Gastrointestinal: Negative for abdominal pain, nausea and vomiting.  Neurological: Negative for dizziness and weakness.    Social History   Tobacco Use  . Smoking status: Former Smoker    Packs/day: 0.50    Years: 10.00    Pack years: 5.00    Types: Cigarettes    Quit date: 04/20/2007    Years since quitting:  11.6  . Smokeless tobacco: Never Used  . Tobacco comment: social smoker back then  Substance Use Topics  . Alcohol use: Yes    Comment: occas      Objective:   There were no vitals taken for this visit. There were no vitals filed for this visit.There is no height or weight on file to calculate BMI.   Physical Exam   No results found for any visits on 12/17/18.     Assessment & Plan     1. Epigastric pain Start PPI and Carafate as Carafate is helped before.  I will see her back in a week.  Advised patient go to ED if she worsens. - omeprazole (PRILOSEC) 20 MG capsule; Take 1 capsule (20 mg total) by mouth daily.  Dispense: 30 capsule; Refill: 3 - sucralfate  (CARAFATE) 1 g tablet; Take 1 tablet (1 g total) by mouth 4 (four) times daily -  with meals and at bedtime.  Dispense: 90 tablet; Refill: 3  2. Primary cancer of lower-inner quadrant of left female breast [facility]) Per oncology and surgery  3. Recurrent major depressive disorder, in partial remission ([place]) Clinically stable  I discussed the limitations of evaluation and management by telemedicine and the availability of in person appointments. The patient expressed understanding and agreed to proceed.   I discussed the assessment and treatment plan with the patient. The patient was provided an opportunity to ask questions and all were answered. The patient agreed with the plan and demonstrated an understanding of the instructions.   The patient was advised to call back or seek an in-person evaluation if the symptoms worsen or if the condition fails to improve as anticipated.  I provided 12 minutes of non-face-to-face time during this encounter.    Clovis Mankins Cranford Mon, MD  [facility]

## 2018-12-17 NOTE — Telephone Encounter (Signed)
Patient called stating that se is undergoing cancer treatment and yesterday developed abdominal pain that come and goes.  She rates the pain at 8.  She also notes that she has fever. Today 100.9 and she is tiered and has SOB with activity.  She works for [employer] and will go to testing site today for COVID-19 testing to return to work. She has not vomited and has no diarrhea. She states her abdomin is sore to touch upper middle. Per protocol office was contacted per patient request for scheduling. Care advice was give for  ER alternative office. Per Dr [name] patient will have virtual visit today.  Reason for Disposition . Patient sounds very sick or weak to the triager  Answer Assessment - Initial Assessment Questions 1. LOCATION: "Where does it hurt?"      Upper sore to touch center 2. RADIATION: "Does the pain shoot anywhere else?" (e.g., chest, back)     No across the stomach 3. ONSET: "When did the pain begin?" (e.g., minutes, hours or days ago)     Started yesterday 4. SUDDEN: "Gradual or sudden onset?"     gradual 5. PATTERN "Does the pain come and go, or is it constant?"    - If constant: "Is it getting better, staying the same, or worsening?"      (Note: Constant means the pain never goes away completely; most serious pain is constant and it progresses)     - If intermittent: "How long does it last?" "Do you have pain now?"     (Note: Intermittent means the pain goes away completely between bouts)     Staying the same pain comes and goes 6. SEVERITY: "How bad is the pain?"  (e.g., Scale 1-10; mild, moderate, or severe)    - MILD (1-3): doesn't interfere with normal activities, abdomen soft and not tender to touch     - MODERATE (4-7): interferes with normal activities or awakens from sleep, tender to touch     - SEVERE (8-10): excruciating pain, doubled over, unable to do any normal activities      8  7. RECURRENT SYMPTOM: "Have you ever had this type of abdominal pain before?"  If so, ask: "When was the last time?" and "What happened that time?"     Yes with dehydration from Chemo 8. AGGRAVATING FACTORS: "Does anything seem to cause this pain?" (e.g., foods, stress, alcohol)    Maybe food, stress 9. CARDIAC SYMPTOMS: "Do you have any of the following symptoms: chest pain, difficulty breathing, sweating, nausea?"    SOB with activity 10. OTHER SYMPTOMS: "Do you have any other symptoms?" (e.g., fever, vomiting, diarrhea)       fever 11. PREGNANCY: "Is there any chance you are pregnant?" "When was your last menstrual period?"     N/A  Protocols used: ABDOMINAL PAIN - UPPER-A-AH

## 2018-12-17 NOTE — Telephone Encounter (Signed)
error 

## 2018-12-18 ENCOUNTER — Telehealth: Payer: Self-pay

## 2018-12-18 ENCOUNTER — Other Ambulatory Visit: Payer: Self-pay

## 2018-12-18 ENCOUNTER — Encounter: Payer: Self-pay | Admitting: Family Medicine

## 2018-12-18 ENCOUNTER — Encounter: Payer: Self-pay | Admitting: Oncology

## 2018-12-18 ENCOUNTER — Observation Stay (HOSPITAL_COMMUNITY)
Admission: EM | Admit: 2018-12-18 | Discharge: 2018-12-20 | Disposition: A | Discharge status: Home / Self Care | Payer: No Typology Code available for payment source | Source: Home / Self Care | Attending: Emergency Medicine | Admitting: Emergency Medicine

## 2018-12-18 ENCOUNTER — Emergency Department: Payer: No Typology Code available for payment source

## 2018-12-18 ENCOUNTER — Encounter: Payer: Self-pay | Admitting: Emergency Medicine

## 2018-12-18 DIAGNOSIS — R1013 Epigastric pain: Secondary | ICD-10-CM | POA: Insufficient documentation

## 2018-12-18 DIAGNOSIS — I10 Essential (primary) hypertension: Secondary | ICD-10-CM | POA: Insufficient documentation

## 2018-12-18 DIAGNOSIS — K7460 Unspecified cirrhosis of liver: Secondary | ICD-10-CM

## 2018-12-18 DIAGNOSIS — Z20828 Contact with and (suspected) exposure to other viral communicable diseases: Secondary | ICD-10-CM | POA: Insufficient documentation

## 2018-12-18 DIAGNOSIS — F419 Anxiety disorder, unspecified: Secondary | ICD-10-CM | POA: Insufficient documentation

## 2018-12-18 DIAGNOSIS — K219 Gastro-esophageal reflux disease without esophagitis: Secondary | ICD-10-CM | POA: Insufficient documentation

## 2018-12-18 DIAGNOSIS — D61818 Other pancytopenia: Secondary | ICD-10-CM | POA: Diagnosis not present

## 2018-12-18 DIAGNOSIS — R109 Unspecified abdominal pain: Secondary | ICD-10-CM

## 2018-12-18 DIAGNOSIS — C50919 Malignant neoplasm of unspecified site of unspecified female breast: Secondary | ICD-10-CM

## 2018-12-18 DIAGNOSIS — Z79899 Other long term (current) drug therapy: Secondary | ICD-10-CM | POA: Insufficient documentation

## 2018-12-18 DIAGNOSIS — C50312 Malignant neoplasm of lower-inner quadrant of left female breast: Secondary | ICD-10-CM | POA: Insufficient documentation

## 2018-12-18 DIAGNOSIS — A419 Sepsis, unspecified organism: Secondary | ICD-10-CM

## 2018-12-18 DIAGNOSIS — J45909 Unspecified asthma, uncomplicated: Secondary | ICD-10-CM | POA: Insufficient documentation

## 2018-12-18 DIAGNOSIS — R188 Other ascites: Secondary | ICD-10-CM | POA: Insufficient documentation

## 2018-12-18 DIAGNOSIS — Z87891 Personal history of nicotine dependence: Secondary | ICD-10-CM | POA: Insufficient documentation

## 2018-12-18 DIAGNOSIS — C787 Secondary malignant neoplasm of liver and intrahepatic bile duct: Secondary | ICD-10-CM | POA: Insufficient documentation

## 2018-12-18 DIAGNOSIS — Z9221 Personal history of antineoplastic chemotherapy: Secondary | ICD-10-CM | POA: Insufficient documentation

## 2018-12-18 DIAGNOSIS — R160 Hepatomegaly, not elsewhere classified: Secondary | ICD-10-CM

## 2018-12-18 DIAGNOSIS — Z7951 Long term (current) use of inhaled steroids: Secondary | ICD-10-CM | POA: Insufficient documentation

## 2018-12-18 DIAGNOSIS — R7989 Other specified abnormal findings of blood chemistry: Secondary | ICD-10-CM | POA: Insufficient documentation

## 2018-12-18 DIAGNOSIS — R1011 Right upper quadrant pain: Secondary | ICD-10-CM | POA: Diagnosis present

## 2018-12-18 LAB — COMPREHENSIVE METABOLIC PANEL
ALT: 119 U/L — ABNORMAL HIGH (ref 0–44)
AST: 188 U/L — ABNORMAL HIGH (ref 15–41)
Albumin: 3.7 g/dL (ref 3.5–5.0)
Alkaline Phosphatase: 125 U/L (ref 38–126)
Anion gap: 13 (ref 5–15)
BUN: 9 mg/dL (ref 6–20)
CO2: 26 mmol/L (ref 22–32)
Calcium: 9.3 mg/dL (ref 8.9–10.3)
Chloride: 96 mmol/L — ABNORMAL LOW (ref 98–111)
Creatinine, Ser: 0.71 mg/dL (ref 0.44–1.00)
GFR calc Af Amer: >60 mL/min (ref >60)
GFR calc non Af Amer: >60 mL/min (ref >60)
Glucose, Bld: 137 mg/dL — ABNORMAL HIGH (ref 70–99)
Potassium: 4.2 mmol/L (ref 3.5–5.1)
Sodium: 135 mmol/L (ref 135–145)
Total Bilirubin: 1.2 mg/dL (ref 0.3–1.2)
Total Protein: 7.1 g/dL (ref 6.5–8.1)

## 2018-12-18 LAB — URINALYSIS, COMPLETE (UACMP) WITH MICROSCOPIC
Bacteria, UA: NONE SEEN
Bilirubin Urine: NEGATIVE
Glucose, UA: NEGATIVE mg/dL
Ketones, ur: NEGATIVE mg/dL
Leukocytes,Ua: NEGATIVE
Nitrite: NEGATIVE
Protein, ur: NEGATIVE mg/dL
Specific Gravity, Urine: 1.008 (ref 1.005–1.030)
pH: 7 (ref 5.0–8.0)

## 2018-12-18 LAB — LACTIC ACID, PLASMA: Lactic Acid, Venous: 1.4 mmol/L (ref 0.5–1.9)

## 2018-12-18 LAB — CBC
HCT: 33.4 % — ABNORMAL LOW (ref 36.0–46.0)
Hemoglobin: 11.5 g/dL — ABNORMAL LOW (ref 12.0–15.0)
MCH: 33 pg (ref 26.0–34.0)
MCHC: 34.4 g/dL (ref 30.0–36.0)
MCV: 95.7 fL (ref 80.0–100.0)
Platelets: 46 10*3/uL — ABNORMAL LOW (ref 150–400)
RBC: 3.49 MIL/uL — ABNORMAL LOW (ref 3.87–5.11)
RDW: 16.3 % — ABNORMAL HIGH (ref 11.5–15.5)
WBC: 3.6 10*3/uL — ABNORMAL LOW (ref 4.0–10.5)
nRBC: 0 % (ref 0.0–0.2)

## 2018-12-18 LAB — LIPASE, BLOOD: Lipase: 28 U/L (ref 11–51)

## 2018-12-18 MED ORDER — ACETAMINOPHEN 650 MG RE SUPP: 650.0000 mg | Freq: Four times a day (QID) | RECTAL | Status: DC | PRN

## 2018-12-18 MED ORDER — LOSARTAN POTASSIUM 50 MG PO TABS
50.0000 mg | ORAL_TABLET | Freq: Every day | ORAL | Status: DC
  Administered 2018-12-19 – 2018-12-20 (×2): 50 mg via ORAL
  Filled 2018-12-18 (×2): qty 1

## 2018-12-18 MED ORDER — ONDANSETRON HCL 4 MG PO TABS: 4.0000 mg | ORAL_TABLET | Freq: Four times a day (QID) | ORAL | Status: DC | PRN

## 2018-12-18 MED ORDER — DIPHENHYDRAMINE HCL 50 MG/ML IJ SOLN
50.0000 mg | Freq: Once | INTRAMUSCULAR | Status: AC
  Administered 2018-12-19: 50 mg via INTRAVENOUS
  Filled 2018-12-18: qty 1

## 2018-12-18 MED ORDER — FAMOTIDINE 20 MG PO TABS: 20.0000 mg | ORAL_TABLET | Freq: Every day | ORAL | Status: DC | PRN

## 2018-12-18 MED ORDER — OMEGA-3-ACID ETHYL ESTERS 1 G PO CAPS
1000.0000 mg | ORAL_CAPSULE | Freq: Every evening | ORAL | Status: DC
  Administered 2018-12-19: 1000 mg via ORAL
  Filled 2018-12-18 (×2): qty 1

## 2018-12-18 MED ORDER — PANTOPRAZOLE SODIUM 40 MG PO TBEC
40.0000 mg | DELAYED_RELEASE_TABLET | Freq: Every day | ORAL | Status: DC
  Administered 2018-12-19 – 2018-12-20 (×2): 40 mg via ORAL
  Filled 2018-12-18 (×2): qty 1

## 2018-12-18 MED ORDER — SUCRALFATE 1 G PO TABS
1.0000 g | ORAL_TABLET | Freq: Three times a day (TID) | ORAL | Status: DC
  Administered 2018-12-19 – 2018-12-20 (×5): 1 g via ORAL
  Filled 2018-12-18 (×5): qty 1

## 2018-12-18 MED ORDER — SODIUM CHLORIDE 0.9 % IV BOLUS
1000.0000 mL | Freq: Once | INTRAVENOUS | Status: AC
  Administered 2018-12-18: 1000 mL via INTRAVENOUS

## 2018-12-18 MED ORDER — VITAMIN D 25 MCG (1000 UNIT) PO TABS
2000.0000 [IU] | ORAL_TABLET | Freq: Every evening | ORAL | Status: DC
  Administered 2018-12-19: 2000 [IU] via ORAL
  Filled 2018-12-18: qty 2

## 2018-12-18 MED ORDER — DIPHENHYDRAMINE HCL 50 MG/ML IJ SOLN
25.0000 mg | Freq: Once | INTRAMUSCULAR | Status: AC
  Administered 2018-12-18: 22:00:00 25 mg via INTRAVENOUS

## 2018-12-18 MED ORDER — SODIUM CHLORIDE 0.9% FLUSH: 3.0000 mL | Freq: Once | INTRAVENOUS | Status: DC

## 2018-12-18 MED ORDER — SODIUM CHLORIDE 0.9 % IV SOLN
2.0000 g | Freq: Once | INTRAVENOUS | Status: AC
  Administered 2018-12-18: 2 g via INTRAVENOUS

## 2018-12-18 MED ORDER — ONDANSETRON HCL 4 MG/2ML IJ SOLN
4.0000 mg | Freq: Four times a day (QID) | INTRAMUSCULAR | Status: DC | PRN
  Administered 2018-12-20 (×2): 4 mg via INTRAVENOUS
  Filled 2018-12-18 (×2): qty 2

## 2018-12-18 MED ORDER — SODIUM CHLORIDE 0.9 % IV SOLN
2.0000 g | Freq: Three times a day (TID) | INTRAVENOUS | Status: DC
  Administered 2018-12-19 – 2018-12-20 (×4): 2 g via INTRAVENOUS
  Filled 2018-12-18 (×6): qty 2

## 2018-12-18 MED ORDER — DIPHENHYDRAMINE HCL 50 MG/ML IJ SOLN
INTRAMUSCULAR | Status: AC
  Administered 2018-12-18: 25 mg via INTRAVENOUS
  Filled 2018-12-18: qty 1

## 2018-12-18 MED ORDER — VANCOMYCIN HCL IN DEXTROSE 750-5 MG/150ML-% IV SOLN
750.0000 mg | Freq: Two times a day (BID) | INTRAVENOUS | Status: DC
  Filled 2018-12-18 (×2): qty 150

## 2018-12-18 MED ORDER — ACETAMINOPHEN 325 MG PO TABS: 650.0000 mg | ORAL_TABLET | Freq: Four times a day (QID) | ORAL | Status: DC | PRN

## 2018-12-18 MED ORDER — HYDROCORTISONE NA SUCCINATE PF 250 MG IJ SOLR
200.0000 mg | Freq: Once | INTRAMUSCULAR | Status: AC
  Administered 2018-12-18: 200 mg via INTRAVENOUS
  Filled 2018-12-18: qty 200

## 2018-12-18 MED ORDER — DIPHENHYDRAMINE HCL 25 MG PO CAPS: 50.0000 mg | ORAL_CAPSULE | Freq: Once | ORAL | Status: AC

## 2018-12-18 MED ORDER — METRONIDAZOLE IN NACL 5-0.79 MG/ML-% IV SOLN
500.0000 mg | Freq: Once | INTRAVENOUS | Status: AC
  Administered 2018-12-18: 500 mg via INTRAVENOUS
  Filled 2018-12-18 (×2): qty 100

## 2018-12-18 MED ORDER — VANCOMYCIN HCL IN DEXTROSE 1-5 GM/200ML-% IV SOLN
1000.0000 mg | Freq: Once | INTRAVENOUS | Status: AC
  Administered 2018-12-18: 1000 mg via INTRAVENOUS
  Filled 2018-12-18: qty 200

## 2018-12-18 NOTE — Consult Note (Signed)
PHARMACY -  BRIEF ANTIBIOTIC NOTE   Pharmacy has received consult(s) for Cefepime/Vancomycin from an ED provider.  The patient's profile has been reviewed for ht/wt/allergies/indication/available labs.    One time order(s) placed for Cefepime 2g x1 dose and Vancomycin 1 g x1 dose   Further antibiotics/pharmacy consults should be ordered by admitting physician if indicated.                       Thank you, Rowland Lathe 12/18/2018  7:42 PM

## 2018-12-18 NOTE — ED Provider Notes (Signed)
[facility] Emergency Department Provider Note  ____________________________________________   First MD Initiated Contact with Patient 12/18/18 1912     (approximate)  I have reviewed the triage vital signs and the nursing notes.   HISTORY  Chief Complaint Fever   HPI Shelby [name] is a 47 y.o. female with breast cancer currently undergoing chemotherapy who presents with fevers.  Patient had a fever of 101 2 days ago.  She does have a port in place.  She has had some intermittent abdominal pain but now worsening in her upper abdomen, moderate, constant, nothing makes it better, nothing makes it worse.  She denies any cough.  She does have a little bit of exertional chest pain with walking upstairs.  Denies prior blood clot or swelling her legs.       Past Medical History:  Diagnosis Date  . Anemia    h/o with pregnancy  . Anxiety   . Asthma    allergy induced-no inhaler  . Cancer [facility])    breast left   . Complication of anesthesia   . Environmental allergies   . Family history of adverse reaction to anesthesia    son-breathing problems-coded 1st time when he was 2 and had another surgery at 4 and had to be admitted for breathing problems  . Family history of breast cancer   . GERD (gastroesophageal reflux disease)   . Headache    migraines  . Hypertension   . Mitral valve disease   . Mitral valve prolapse   . Painful menstrual periods   . PONV (postoperative nausea and vomiting)   . Vertigo     Patient Active Problem List   Diagnosis Date Noted  . Drug-induced neutropenia ([place]) 03/21/2018  . Primary cancer of lower-inner quadrant of left female breast ([place]) 12/23/2017  . Bronchitis 03/10/2015  . Palpitations 02/15/2015  . Anxiety 01/20/2015  . Clinical depression 01/20/2015  . Diverticulitis 01/20/2015  . BP (high blood pressure) 01/20/2015  . Adaptive colitis 01/20/2015  . Cannot sleep 01/20/2015  . Headache, variant migraine  01/20/2015  . Disorder of mitral valve 01/20/2015  . Herpes zona 01/20/2015  . Fibroid 05/07/2007  . Allergic rhinitis 10/29/2006    Past Surgical History:  Procedure Laterality Date  . ABDOMINAL HYSTERECTOMY  2010   supracervical   . AXILLARY LYMPH NODE DISSECTION Left 07/12/2018   Procedure: AXILLARY LYMPH NODE DISSECTION;  Surgeon: Robert Bellow, MD;  Location: [facility] ORS;  Service: General;  Laterality: Left;  . BREAST BIOPSY Left 11/2017   invasive ductal carcinoma and metastatic LN  . BREAST BIOPSY WITH SENTINEL LYMPH NODE BIOPSY AND NEEDLE LOCALIZATION Left 07/12/2018   Procedure: BREAST BIOPSY WIDE EXCISION WITH SENTINEL NODE  AND NEEDLE LOCALIZATION OF OXILLARY NODS LEFT;  Surgeon: Robert Bellow, MD;  Location: [facility] ORS;  Service: General;  Laterality: Left;  . BUNIONECTOMY Bilateral   . MANDIBLE RECONSTRUCTION     age 63-underbite  . PORTACATH PLACEMENT Right 12/31/2017   Procedure: INSERTION PORT-A-CATH;  Surgeon: Robert Bellow, MD;  Location: [facility] ORS;  Service: General;  Laterality: Right;  . RE-EXCISION OF BREAST LUMPECTOMY Left 07/24/2018   Procedure: RE-EXCISION OF BREAST LUMPECTOMY LEFT;  Surgeon: Robert Bellow, MD;  Location: [facility] ORS;  Service: General;  Laterality: Left;  . SHOULDER ARTHROSCOPY WITH OPEN ROTATOR CUFF REPAIR Right 04/26/2017   Procedure: SHOULDER ARTHROSCOPY WITH OPEN ROTATOR CUFF REPAIR,SUBACROMINAL DECOMPRESSION;  Surgeon: Thornton Park, MD;  Location: [facility] ORS;  Service: Orthopedics;  Laterality: Right;  . TONSILLECTOMY      Prior to Admission medications   Medication Sig Start Date End Date Taking? Authorizing Provider  ALPRAZolam Duanne Moron) 0.5 MG tablet Take 0.5-1 tablets (0.25-0.5 mg total) by mouth 2 (two) times daily as needed for anxiety. Patient not taking: Reported on 11/22/2018 06/11/18   Verlon Au, NP  capecitabine (XELODA) 500 MG tablet Take 4 tablets (2,000 mg total) by mouth 2 (two) times daily after a meal. Take  for 14 days, then hold for 7 days. Repeat every 21 days. 10/10/18   Lloyd Huger, MD  Cholecalciferol (VITAMIN D) 50 MCG (2000 UT) CAPS Take 2,000 Units by mouth daily.    [provider]  EPINEPHrine 0.3 mg/0.3 mL IJ SOAJ injection Inject 0.3 mLs (0.3 mg total) into the muscle once. Patient not taking: Reported on 11/08/2018 03/29/15   Jerrol Banana., MD  famotidine (PEPCID) 20 MG tablet Take 20 mg by mouth daily.  12/31/17   [provider]  ibuprofen (ADVIL,MOTRIN) 200 MG tablet Take 200 mg by mouth daily as needed for mild pain.     [provider]  losartan (COZAAR) 50 MG tablet TAKE 1 TABLET BY MOUTH DAILY 11/16/18   Jerrol Banana., MD  mometasone (NASONEX) 50 MCG/ACT nasal spray Place 2 sprays into the nose daily. Patient taking differently: Place 2 sprays into the nose daily as needed (allergies).  04/18/18   Lloyd Huger, MD  montelukast (SINGULAIR) 10 MG tablet TAKE 1 TABLET BY MOUTH AT BEDTIME Patient taking differently: Take 10 mg by mouth daily as needed (allergies).  04/17/18   Jerrol Banana., MD  Omega-3 1000 MG CAPS Take by mouth daily.    [provider]  omeprazole (PRILOSEC) 20 MG capsule Take 1 capsule (20 mg total) by mouth daily. 12/17/18   Jerrol Banana., MD  sucralfate (CARAFATE) 1 g tablet Take 1 tablet (1 g total) by mouth 4 (four) times daily -  with meals and at bedtime. 12/17/18   Jerrol Banana., MD  prochlorperazine (COMPAZINE) 10 MG tablet Take 1 tablet (10 mg total) by mouth every 6 (six) hours as needed (Nausea or vomiting). 12/24/17 10/09/18  Lloyd Huger, MD    Allergies Hydrocodone, Iodine, Peanut butter flavor, and Shellfish allergy  Family History  Problem Relation Age of Onset  . Breast cancer Mother 38  . Diabetes Father   . Cancer Maternal Uncle        colon  . Breast cancer Maternal Grandmother 70    Social History Social History   Tobacco Use  . Smoking  status: Former Smoker    Packs/day: 0.50    Years: 10.00    Pack years: 5.00    Types: Cigarettes    Quit date: 04/20/2007    Years since quitting: 11.6  . Smokeless tobacco: Never Used  . Tobacco comment: social smoker back then  Substance Use Topics  . Alcohol use: Yes    Comment: occas  . Drug use: No      Review of Systems Constitutional: No fever/chills Eyes: No visual changes. ENT: No sore throat. Cardiovascular: Denies chest pain. Respiratory: Shortness of breath with walking upstairs. Gastrointestinal: Abdominal pain.  No nausea, no vomiting.  No diarrhea.  No constipation. Genitourinary: Negative for dysuria. Musculoskeletal: Negative for back pain. Skin: Negative for rash. Neurological: Negative for headaches, focal weakness or numbness. All other ROS negative ____________________________________________   PHYSICAL EXAM:  VITAL SIGNS: ED Triage Vitals  Enc Vitals Group     BP 12/18/18 1422 (!) 149/95     Pulse Rate 12/18/18 1422 (!) 120     Resp 12/18/18 1422 18     Temp 12/18/18 1422 99.4 F (37.4 C)     Temp Source 12/18/18 1422 Oral     SpO2 12/18/18 1422 98 %     Weight --      Height --      Head Circumference --      Peak Flow --      Pain Score 12/18/18 1423 10     Pain Loc --      Pain Edu? --      Excl. in [place]? --     Constitutional: Alert and oriented. Well appearing and in no acute distress. Eyes: Conjunctivae are normal. EOMI. Head: Atraumatic. Nose: No congestion/rhinnorhea. Mouth/Throat: Mucous membranes are moist.   Neck: No stridor. Trachea Midline. FROM Cardiovascular: Tachycardic, regular rhythm. Grossly normal heart sounds.  Good peripheral circulation. Respiratory: Normal respiratory effort.  No retractions. Lungs CTAB. Gastrointestinal: Tender in the upper abdomen.. No distention. No abdominal bruits.  Musculoskeletal: No lower extremity tenderness nor edema.  No joint effusions. Neurologic:  Normal speech and language. No  gross focal neurologic deficits are appreciated.  Skin:  Skin is warm, dry and intact. No rash noted. Psychiatric: Mood and affect are normal. Speech and behavior are normal. GU: Deferred   ____________________________________________   LABS (all labs ordered are listed, but only abnormal results are displayed)  Labs Reviewed  COMPREHENSIVE METABOLIC PANEL - Abnormal; Notable for the following components:      Result Value   Chloride 96 (*)    Glucose, Bld 137 (*)    AST 188 (*)    ALT 119 (*)    All other components within normal limits  CBC - Abnormal; Notable for the following components:   WBC 3.6 (*)    RBC 3.49 (*)    Hemoglobin 11.5 (*)    HCT 33.4 (*)    RDW 16.3 (*)    Platelets 46 (*)    All other components within normal limits  LIPASE, BLOOD  URINALYSIS, COMPLETE (UACMP) WITH MICROSCOPIC   ____________________________________________   ED ECG REPORT I, [name] Mars, the attending physician, personally viewed and interpreted this ECG.  EKG is sinus tachycardia rate of 100, no ST elevation, T wave inversion in lead III.  Q-wave in lead III ____________________________________________  RADIOLOGY Robert Bellow, personally viewed and evaluated these images (plain radiographs) as part of my medical decision making, as well as reviewing the written report by the radiologist.  ED MD interpretation: No pneumonia  Official radiology report(s): Ct Abdomen Pelvis Wo Contrast  Addendum Date: 12/18/2018   ADDENDUM REPORT: 12/18/2018 20:18 ADDENDUM: These results were called by telephone on 12/18/2018 at 8:18 pm to provider [facility] , who verbally acknowledged these results. Electronically Signed   By: Lovena Le M.D.   On: 12/18/2018 20:18   Result Date: 12/18/2018 CLINICAL DATA:  Abdominal pain and distension, undergoing chemotherapy for breast cancer, history of IBS and diverticulitis EXAM: CT ABDOMEN AND PELVIS WITHOUT CONTRAST TECHNIQUE: Multidetector CT  imaging of the abdomen and pelvis was performed following the standard protocol without IV contrast. COMPARISON:  CT abdomen pelvis 01/01/2018 FINDINGS: Lower chest: The patchy areas of subpleural ground-glass opacity could reflect atelectasis or early infection. Cardiac size is normal. Trace pericardial effusion, similar to prior. Hepatobiliary: Ill-defined  regions of hypoattenuation are seen the anterior left lobe liver. Slightly nodular hepatic surface contour which is new from comparison exam. These features are both incompletely assessed in the absence of contrast media. Gallbladder appears largely contracted at the time of exam. No biliary ductal dilatation. Pancreas: Unremarkable. No pancreatic ductal dilatation or surrounding inflammatory changes. Spleen: Normal in size without focal abnormality. Adrenals/Urinary Tract: No suspicious adrenal lesions. No visible or contour deforming renal lesions. No urolithiasis or hydronephrosis mild circumferential bladder wall thickening. Stomach/Bowel: Distal esophagus, stomach and duodenal sweep are unremarkable. No small bowel wall thickening or dilatation. No evidence of obstruction. Appendix is not clearly identified. No pericecal inflammation. There is some mild mural thickening of the splenic flexure and descending colon remaining portions of the colon are free of mural thickening or dilatation. Vascular/Lymphatic: Upper abdominal venous collateralization and splenorenal shunting is noted. No suspicious or enlarged lymph nodes in the included lymphatic chains. Reproductive: Uterus is surgically absent. No concerning adnexal lesions. Other: Small volume of low-attenuation ascites noted in the low abdomen. Small volume of perihepatic low-attenuation ascites is present as well. Musculoskeletal: Multilevel degenerative changes are present in the imaged portions of the spine. No acute osseous abnormality or suspicious osseous lesion. IMPRESSION: 1. Interval development  of cirrhotic stigmata including a nodular liver surface contour and increasing upper abdominal venous collateralization. Small volume of ascites is noted as well. 2. Ill-defined regions of hypoattenuation are seen within the anterior left lobe liver, which are incompletely assessed in the absence of contrast media. Furthermore findings are worrisome in the setting of known malignancy and potential intrinsic liver disease. Consider further evaluation with MRI or ultrasound if patient is unable to tolerate CT contrast due to allergy. 3. Mild mural thickening of the splenic flexure and descending colon, could reflect change related to the additional cirrhotic findings/portal colopathy though should exclude a an acute colitis clinically. 4. Patchy areas of subpleural ground-glass opacity in the visualized lung bases could reflect atelectasis or early infection. 5. Mild circumferential bladder wall thickening, which may be related to underdistention. Correlate with urinalysis to exclude cystitis. 6. Unchanged trace pericardial effusion. Electronically Signed: By: Lovena Le M.D. On: 12/18/2018 20:07   Dg Chest Portable 1 View  Result Date: 12/18/2018 CLINICAL DATA:  Fever abdominal pain EXAM: PORTABLE CHEST 1 VIEW COMPARISON:  12/31/2017 FINDINGS: Right-sided central venous port with tip over the cavoatrial region. No focal airspace disease or effusion. Normal heart size. No pneumothorax. IMPRESSION: No active disease. Electronically Signed   By: Donavan Foil M.D.   On: 12/18/2018 19:49    ____________________________________________   PROCEDURES  Procedure(s) performed (including Critical Care):  .Critical Care Performed by: [name]  Junction, MD Authorized by: [name] Franklin, MD   Critical care provider statement:    Critical care time (minutes):  45   Critical care was necessary to treat or prevent imminent or life-threatening deterioration of the following conditions:  Sepsis   Critical care was  time spent personally by me on the following activities:  Discussions with consultants, evaluation of patient's response to treatment, examination of patient, ordering and performing treatments and interventions, ordering and review of laboratory studies, ordering and review of radiographic studies, pulse oximetry, re-evaluation of patient's condition, obtaining history from patient or surrogate and review of old charts     ____________________________________________   INITIAL IMPRESSION / ASSESSMENT AND PLAN / ED COURSE  Shelby [name] was evaluated in Emergency Department on 12/18/2018 for the symptoms described in the history of  present illness. She was evaluated in the context of the global COVID-19 pandemic, which necessitated consideration that the patient might be at risk for infection with the SARS-CoV-2 virus that causes COVID-19. Institutional protocols and algorithms that pertain to the evaluation of patients at risk for COVID-19 are in a state of rapid change based on information released by regulatory bodies including the CDC and federal and state organizations. These policies and algorithms were followed during the patient's care in the ED.    Patient presents with abdominal pain and fevers.  Also exertional chest pain but no shortness of breath at rest.  Patient offered CT scan of her abdomen to evaluate for abdominal infection.  Patient is meeting SIRS criteria with elevated heart rate and low white cell count.  Sepsis alert called and patient start on broad-spectrum antibiotics given she is a dialysis patient.  Will get chest x-ray to evaluate for pneumonia, urine to evaluate for UTI.  CT scan concerning for multiple findings including cirrhosis with possible metastatic disease versus intrinsic liver disease.  Recommend a CT with contrast.  Patient also with possible early infection her lung bases as well as possible UTI versus possible colitis.  Patient is already on antibiotics.   Discussed with patient and she has tolerated contrast previously with Benadryl and steroids.  She denies ever having any issue with that.  Her prior reactions have been hives.  We will put in the prep.  Will discuss with hospital team for admission given she is to come in for sepsis rule out.  Discussed possible team and they will admit patient.  I discussed with oncoming ED doctor while patient is awaiting bed.     ____________________________________________   FINAL CLINICAL IMPRESSION(S) / ED DIAGNOSES   Final diagnoses:  Sepsis, due to unspecified organism, unspecified whether acute organ dysfunction present ([place])  Cirrhosis of liver with ascites, unspecified hepatic cirrhosis type ([place])      MEDICATIONS GIVEN DURING THIS VISIT:  Medications  metroNIDAZOLE (FLAGYL) IVPB 500 mg (has no administration in time range)  vancomycin (VANCOCIN) IVPB 1000 mg/200 mL premix (1,000 mg Intravenous New Bag/Given 12/18/18 2018)  hydrocortisone sodium succinate (SOLU-CORTEF) injection 200 mg (has no administration in time range)  diphenhydrAMINE (BENADRYL) capsule 50 mg (has no administration in time range)    Or  diphenhydrAMINE (BENADRYL) injection 50 mg (has no administration in time range)  sodium chloride 0.9 % bolus 1,000 mL (1,000 mLs Intravenous New Bag/Given 12/18/18 2016)  ceFEPIme (MAXIPIME) 2 g in sodium chloride 0.9 % 100 mL IVPB (0 g Intravenous Stopped 12/18/18 2046)     ED Discharge Orders    None       Note:  This document was prepared using Dragon voice recognition software and may include unintentional dictation errors.   [name] Cumberland, MD 12/18/18 2104

## 2018-12-18 NOTE — Telephone Encounter (Signed)
From [facility], Please review

## 2018-12-18 NOTE — ED Triage Notes (Addendum)
PT c/o upper abd pain xfew days and fever. PT currently undergoing chemo for breast cancer. Denies n/v/d. PT appears pale. Hx of IBS and diverticulitis

## 2018-12-18 NOTE — ED Notes (Signed)
Pt c/o itching to head. Red flat rash noted around pt's forehead and scalp. No hives, no SOB at this time. MD Quentin Cornwall notified. Order received for 25mg  Benadryl IV now.

## 2018-12-18 NOTE — Telephone Encounter (Signed)
Shelby [name] (Patient) Shelby [name] (Patient) General - Inquiry  Summary: call back  Reason for CRM: Patient is requesting a call back from PCP nurse regarding patients fever and cramps. Patient did sent a mychart message also regarding issues.   Call back [phone]

## 2018-12-18 NOTE — Telephone Encounter (Signed)
Called and spoke with patient. She is still not feeling any better since her virtual visit with Dr. [name] on 11/16/18. She said the pain has gotten worse and she is unable to eat or drink anything. She was instructed that with her pain and unable to eat or drink anything to go to the ED for labs and evaluation . She gave verbal understanding and will follow up after her ED visit.

## 2018-12-18 NOTE — Progress Notes (Signed)
CODE SEPSIS - PHARMACY COMMUNICATION  **Broad Spectrum Antibiotics should be administered within 1 hour of Sepsis diagnosis**  Time Code Sepsis Called/Page Received: @1933   Antibiotics Ordered: Metronidazole, Vancomycin, Cefepime  Time of 1st antibiotic administration: Cefepime @ 2016  Additional action taken by pharmacy: N/A  If necessary, Name of Provider/Nurse Contacted: [place] ,PharmD Clinical Pharmacist  12/18/2018  7:39 PM

## 2018-12-18 NOTE — Telephone Encounter (Signed)
Patient calling back about this request. Requesting call back as soon as possible.  

## 2018-12-18 NOTE — H&P (Signed)
History and Physical    TULLY BOAKYE [id number] DOB: [birth year] DOA: 12/18/2018  PCP: Jerrol Banana., MD  Patient coming from: Home.  Chief Complaint: Epigastric pain with fever.  HPI: Shelby [name] is a 48 y.o. female with history of stage IIa triple negative invasive carcinoma of the left breast status post neoadjuvant chemotherapy and resection and June 2020 presently on Xeloda presents to the ER because of ongoing epigastric pain over the last 3 to 4 days and also had subjective feeling of fever chills and has recorded a fever 101 F at home.  Denies any nausea vomiting had 1 episode of diarrhea after taking mag citrate.  Denies productive cough chest pain.  Patient states over the last few days patient also noticed exertional dyspnea particularly on walking up the steps.  Patient's oncology also noted patient has been having increasing weakness likely related to Xeloda and also palma plantar erythro-dysesthesia.  ED Course: In the ER patient was mildly febrile 99.4 tachycardic at heart rate of 120 labs show WBC of 3.6 hemoglobin 9.5 platelets 46 creatinine 1.7 LFTs were showing AST of 188 ALT 119 increased from baseline chest x-ray was unremarkable COVID-19 test is pending.  EKG shows normal sinus rhythm.  CT abdomen pelvis done shows features concerning for cirrhosis of the liver and hypoattenuation lesions in the liver which will need further study and also mural thickening of the splenic flexure and descending colon which could be due to portal colopathy or colitis and also groundglass opacities in the lung bases.  Patient is started on empiric antibiotics and at this time plan is to get a CT angiogram of the chest and CT abdomen pelvis with contrast after getting steroids for contrast allergy.  Review of Systems: As per HPI, rest all negative.   Past Medical History:  Diagnosis Date   Anemia    h/o with pregnancy   Anxiety    Asthma    allergy induced-no  inhaler   Cancer ([place])    breast left    Complication of anesthesia    Environmental allergies    Family history of adverse reaction to anesthesia    son-breathing problems-coded 1st time when he was 2 and had another surgery at 1 and had to be admitted for breathing problems   Family history of breast cancer    GERD (gastroesophageal reflux disease)    Headache    migraines   Hypertension    Mitral valve disease    Mitral valve prolapse    Painful menstrual periods    PONV (postoperative nausea and vomiting)    Vertigo     Past Surgical History:  Procedure Laterality Date   ABDOMINAL HYSTERECTOMY  2010   supracervical    AXILLARY LYMPH NODE DISSECTION Left 07/12/2018   Procedure: AXILLARY LYMPH NODE DISSECTION;  Surgeon: Robert Bellow, MD;  Location: [facility] ORS;  Service: General;  Laterality: Left;   BREAST BIOPSY Left 11/2017   invasive ductal carcinoma and metastatic LN   BREAST BIOPSY WITH SENTINEL LYMPH NODE BIOPSY AND NEEDLE LOCALIZATION Left 07/12/2018   Procedure: BREAST BIOPSY WIDE EXCISION WITH SENTINEL NODE  AND NEEDLE LOCALIZATION OF [name] LEFT;  Surgeon: Robert Bellow, MD;  Location: [facility] OR[facility];  Service: General;  Laterality: Left;   BUNIONECTOMY Bilateral    MANDIBLE RECONSTRUCTION     age 14-underbite   PORTACATH PLACEMENT Right 12/31/2017   Procedure: INSERTION PORT-A-CATH;  Surgeon: Robert Bellow, MD;  Location: [facility] OR[facility];  Service: General;  Laterality: Right;   RE-EXCISION OF BREAST LUMPECTOMY Left 07/24/2018   Procedure: RE-EXCISION OF BREAST LUMPECTOMY LEFT;  Surgeon: Robert Bellow, MD;  Location: [facility];  Service: General;  Laterality: Left;   SHOULDER ARTHROSCOPY WITH OPEN ROTATOR CUFF REPAIR Right 04/26/2017   Procedure: SHOULDER ARTHROSCOPY WITH OPEN ROTATOR CUFF REPAIR,SUBACROMINAL DECOMPRESSION;  Surgeon: Thornton Park, MD;  Location: [facility];  Service: Orthopedics;  Laterality: Right;   TONSILLECTOMY        reports that she quit smoking about 11 years ago. Her smoking use included cigarettes. She has a 5.00 pack-year smoking history. She has never used smokeless tobacco. She reports current alcohol use. She reports that she does not use drugs.  Allergies  Allergen Reactions   Hydrocodone Hives    Swollen face - many years ago. Thinks she has tolerated since.   Iodine Hives   Peanut Butter Flavor Other (See Comments)    Made mouth tingle   Shellfish Allergy Hives    Family History  Problem Relation Age of Onset   Breast cancer Mother 51   Diabetes Father    Cancer Maternal Uncle        colon   Breast cancer Maternal Grandmother 70    Prior to Admission medications   Medication Sig Start Date End Date Taking? Authorizing Provider  capecitabine (XELODA) 500 MG tablet Take 4 tablets (2,000 mg total) by mouth 2 (two) times daily after a meal. Take for 14 days, then hold for 7 days. Repeat every 21 days. 10/10/18  Yes Lloyd Huger, MD  Cholecalciferol (VITAMIN D) 50 MCG (2000 UT) CAPS Take 2,000 Units by mouth every evening.    Yes [provider]  famotidine (PEPCID) 20 MG tablet Take 20 mg by mouth daily as needed for heartburn.    Yes [provider]  ibuprofen (ADVIL,MOTRIN) 200 MG tablet Take 200 mg by mouth daily as needed for mild pain.    Yes [provider]  losartan (COZAAR) 50 MG tablet TAKE 1 TABLET BY MOUTH DAILY Patient taking differently: Take 50 mg by mouth daily.  11/16/18  Yes Jerrol Banana., MD  mometasone (NASONEX) 50 MCG/ACT nasal spray Place 2 sprays into the nose daily. Patient taking differently: Place 2 sprays into the nose daily as needed (allergies).  04/18/18  Yes Finnegan, [name], MD  montelukast (SINGULAIR) 10 MG tablet TAKE 1 TABLET BY MOUTH AT BEDTIME Patient taking differently: Take 10 mg by mouth daily as needed (allergies).  04/17/18  Yes Jerrol Banana., MD  Omega-3 1000 MG CAPS Take 1,000 mg  by mouth every evening.    Yes [provider]  sucralfate (CARAFATE) 1 g tablet Take 1 tablet (1 g total) by mouth 4 (four) times daily -  with meals and at bedtime. 12/17/18  Yes Jerrol Banana., MD  ALPRAZolam Duanne Moron) 0.5 MG tablet Take 0.5-1 tablets (0.25-0.5 mg total) by mouth 2 (two) times daily as needed for anxiety. Patient not taking: Reported on 11/22/2018 06/11/18   Verlon Au, NP  EPINEPHrine 0.3 mg/0.3 mL IJ SOAJ injection Inject 0.3 mLs (0.3 mg total) into the muscle once. Patient not taking: Reported on 11/08/2018 03/29/15   Jerrol Banana., MD  omeprazole (PRILOSEC) 20 MG capsule Take 1 capsule (20 mg total) by mouth daily. 12/17/18   Jerrol Banana., MD  prochlorperazine (COMPAZINE) 10 MG tablet Take 1 tablet (10 mg total) by mouth every 6 (six)  hours as needed (Nausea or vomiting). 12/24/17 10/09/18  Lloyd Huger, MD    Physical Exam: Constitutional: Moderately built and nourished. Vitals:   12/18/18 2030 12/18/18 2100 12/18/18 2130 12/18/18 2200  BP: (!) 141/92 (!) 145/92 136/88 (!) 142/93  Pulse: 81 (!) 110 98 (!) 104  Resp: 15 14 17 16   Temp:      TempSrc:      SpO2: 100% 99% 99% 99%   Eyes: Anicteric no pallor. ENMT: No discharge from the ears eyes nose or mouth. Neck: No mass felt.  No neck rigidity. Respiratory: No rhonchi or crepitations. Cardiovascular: S1-S2 heard. Abdomen: Soft nontender bowel sounds present. Musculoskeletal: No edema. Skin: No rash. Neurologic: Alert awake oriented to time place and person.  Moves all extremities. Psychiatric: Appears normal per normal affect.   Labs on Admission: I have personally reviewed following labs and imaging studies  CBC: Recent Labs  Lab 12/16/18 1328 12/18/18 1433  WBC 4.2 3.6*  NEUTROABS 3.1  --   HGB 11.4* 11.5*  HCT 33.1* 33.4*  MCV 95.1 95.7  PLT 60* 46*   Basic Metabolic Panel: Recent Labs  Lab 12/16/18 1328 12/18/18 1433  NA 133* 135  K 3.9 4.2  CL  99 96*  CO2 27 26  GLUCOSE 143* 137*  BUN 14 9  CREATININE 0.65 0.71  CALCIUM 9.1 9.3   GFR: Estimated Creatinine Clearance: 78.8 mL/min (by C-G formula based on SCr of 0.71 mg/dL). Liver Function Tests: Recent Labs  Lab 12/16/18 1328 12/18/18 1433  AST 139* 188*  ALT 80* 119*  ALKPHOS 112 125  BILITOT 1.0 1.2  PROT 7.1 7.1  ALBUMIN 3.8 3.7   Recent Labs  Lab 12/18/18 1433  LIPASE 28   No results for input(s): AMMONIA in the last 168 hours. Coagulation Profile: No results for input(s): INR, PROTIME in the last 168 hours. Cardiac Enzymes: No results for input(s): CKTOTAL, CKMB, CKMBINDEX, TROPONINI in the last 168 hours. BNP (last 3 results) No results for input(s): PROBNP in the last 8760 hours. HbA1C: No results for input(s): HGBA1C in the last 72 hours. CBG: No results for input(s): GLUCAP in the last 168 hours. Lipid Profile: No results for input(s): CHOL, HDL, LDLCALC, TRIG, CHOLHDL, LDLDIRECT in the last 72 hours. Thyroid Function Tests: No results for input(s): TSH, T4TOTAL, FREET4, T3FREE, THYROIDAB in the last 72 hours. Anemia Panel: No results for input(s): VITAMINB12, FOLATE, FERRITIN, TIBC, IRON, RETICCTPCT in the last 72 hours. Urine analysis:    Component Value Date/Time   COLORURINE YELLOW (A) 12/18/2018 1957   APPEARANCEUR CLEAR (A) 12/18/2018 1957   LABSPEC 1.008 12/18/2018 1957   PHURINE 7.0 12/18/2018 1957   GLUCOSEU NEGATIVE 12/18/2018 1957   HGBUR SMALL (A) 12/18/2018 [place] NEGATIVE 12/18/2018 1957   BILIRUBINUR neg 05/16/2016 1537   KETONESUR NEGATIVE 12/18/2018 1957   PROTEINUR NEGATIVE 12/18/2018 1957   UROBILINOGEN 0.2 05/16/2016 1537   NITRITE NEGATIVE 12/18/2018 1957   LEUKOCYTESUR NEGATIVE 12/18/2018 1957   Sepsis Labs: @LABRCNTIP (procalcitonin:4,lacticidven:4) )No results found for this or any previous visit (from the past 240 hour(s)).   Radiological Exams on Admission: Ct Abdomen Pelvis Wo Contrast  Addendum  Date: 12/18/2018   ADDENDUM REPORT: 12/18/2018 20:18 ADDENDUM: These results were called by telephone on 12/18/2018 at 8:18 pm to provider [place] , who verbally acknowledged these results. Electronically Signed   By: Lovena Le M.D.   On: 12/18/2018 20:18   Result Date: 12/18/2018 CLINICAL DATA:  Abdominal pain  and distension, undergoing chemotherapy for breast cancer, history of IBS and diverticulitis EXAM: CT ABDOMEN AND PELVIS WITHOUT CONTRAST TECHNIQUE: Multidetector CT imaging of the abdomen and pelvis was performed following the standard protocol without IV contrast. COMPARISON:  CT abdomen pelvis 01/01/2018 FINDINGS: Lower chest: The patchy areas of subpleural ground-glass opacity could reflect atelectasis or early infection. Cardiac size is normal. Trace pericardial effusion, similar to prior. Hepatobiliary: Ill-defined regions of hypoattenuation are seen the anterior left lobe liver. Slightly nodular hepatic surface contour which is new from comparison exam. These features are both incompletely assessed in the absence of contrast media. Gallbladder appears largely contracted at the time of exam. No biliary ductal dilatation. Pancreas: Unremarkable. No pancreatic ductal dilatation or surrounding inflammatory changes. Spleen: Normal in size without focal abnormality. Adrenals/Urinary Tract: No suspicious adrenal lesions. No visible or contour deforming renal lesions. No urolithiasis or hydronephrosis mild circumferential bladder wall thickening. Stomach/Bowel: Distal esophagus, stomach and duodenal sweep are unremarkable. No small bowel wall thickening or dilatation. No evidence of obstruction. Appendix is not clearly identified. No pericecal inflammation. There is some mild mural thickening of the splenic flexure and descending colon remaining portions of the colon are free of mural thickening or dilatation. Vascular/Lymphatic: Upper abdominal venous collateralization and splenorenal shunting is  noted. No suspicious or enlarged lymph nodes in the included lymphatic chains. Reproductive: Uterus is surgically absent. No concerning adnexal lesions. Other: Small volume of low-attenuation ascites noted in the low abdomen. Small volume of perihepatic low-attenuation ascites is present as well. Musculoskeletal: Multilevel degenerative changes are present in the imaged portions of the spine. No acute osseous abnormality or suspicious osseous lesion. IMPRESSION: 1. Interval development of cirrhotic stigmata including a nodular liver surface contour and increasing upper abdominal venous collateralization. Small volume of ascites is noted as well. 2. Ill-defined regions of hypoattenuation are seen within the anterior left lobe liver, which are incompletely assessed in the absence of contrast media. Furthermore findings are worrisome in the setting of known malignancy and potential intrinsic liver disease. Consider further evaluation with MRI or ultrasound if patient is unable to tolerate CT contrast due to allergy. 3. Mild mural thickening of the splenic flexure and descending colon, could reflect change related to the additional cirrhotic findings/portal colopathy though should exclude a an acute colitis clinically. 4. Patchy areas of subpleural ground-glass opacity in the visualized lung bases could reflect atelectasis or early infection. 5. Mild circumferential bladder wall thickening, which may be related to underdistention. Correlate with urinalysis to exclude cystitis. 6. Unchanged trace pericardial effusion. Electronically Signed: By: Lovena Le M.D. On: 12/18/2018 20:07   Dg Chest Portable 1 View  Result Date: 12/18/2018 CLINICAL DATA:  Fever abdominal pain EXAM: PORTABLE CHEST 1 VIEW COMPARISON:  12/31/2017 FINDINGS: Right-sided central venous port with tip over the cavoatrial region. No focal airspace disease or effusion. Normal heart size. No pneumothorax. IMPRESSION: No active disease.  Electronically Signed   By: Donavan Foil M.D.   On: 12/18/2018 19:49    EKG: Independently reviewed.  Normal sinus rhythm.  Assessment/Plan Active Problems:   Primary cancer of lower-inner quadrant of left female breast ([place])   Abdominal pain   Pancytopenia ([place])    1. SIRS picture on presentation with epigastric pain -empiric antibiotics have been started with CAT scan showing possible groundglass opacities in the lung bases.  CT angiogram of the chest is pending.  COVID-19 is pending on empiric antibiotics.  Follow cultures. 2. Epigastric pain with elevated LFTs and possible new cirrhotic  features with mild ascites and also new lesions in the liver for which CT abdomen pelvis has been ordered with contrast after getting appropriate steroid doses for contrast allergy.  Presently on empiric antibiotics.  Further recommendation based on the CT findings.  Abdominal pain has improved at this time.  Follow LFTs. 3. Pancytopenia likely related to chemotherapy.  Follow CBC. 4. Breast cancer being followed by oncologist on Xeloda. 5. Exertional shortness of breath with history of chemotherapy will check 2D echo and follow CT angiogram of the chest.  Follow COVID-19 test.  COVID-19 test CT angiogram of the chest and CT abdomen pelvis are pending.   DVT prophylaxis: SCDs patient has pancytopenia. Code Status: Full code. Family Communication: Discussed with patient. Disposition Plan: Home. Consults called: None. Admission status: Observation.   Rise Patience MD [facility] Pager [phone].  If 7PM-7AM, please contact night-coverage [email or web address] Password [email or web address]  12/18/2018, 11:14 PM

## 2018-12-18 NOTE — Progress Notes (Signed)
Pharmacy Antibiotic Note  Shelby [name] is a 48 y.o. female admitted on 12/18/2018 with sepsis.  Pharmacy has been consulted for vanc/cefepime dosing. Patient received vanc 1g and cefepime 2g IV x 1 in ED  Plan: Vancomycin 750 mg IV Q 8 hrs. Goal AUC 400-550. Expected AUC: 450.6 SCr used: 0.8 (actual 0.71) Cssmin: 12.9  Will continue cefepime 2g IV q8h per CrCl > 60 ml/min and will continue to monitor s/sx of infx and renal function and adjust as needed.  Temp (24hrs), Avg:99.4 F (37.4 C), Min:99.4 F (37.4 C), Max:99.4 F (37.4 C)  Recent Labs  Lab 12/16/18 1328 12/18/18 1433 12/18/18 1957  WBC 4.2 3.6*  --   CREATININE 0.65 0.71  --   LATICACIDVEN  --   --  1.4    Estimated Creatinine Clearance: 78.8 mL/min (by C-G formula based on SCr of 0.71 mg/dL).    Allergies  Allergen Reactions  . Hydrocodone Hives    Swollen face - many years ago. Thinks she has tolerated since.  . Iodine Hives  . Peanut Butter Flavor Other (See Comments)    Made mouth tingle  . Shellfish Allergy Hives    Thank you for allowing pharmacy to be a part of this patient's care.  Tobie Lords, PharmD, BCPS Clinical Pharmacist 12/18/2018 11:25 PM

## 2018-12-19 ENCOUNTER — Ambulatory Visit: Payer: No Typology Code available for payment source | Admitting: Family Medicine

## 2018-12-19 ENCOUNTER — Inpatient Hospital Stay: Payer: No Typology Code available for payment source

## 2018-12-19 ENCOUNTER — Inpatient Hospital Stay: Payer: No Typology Code available for payment source | Admitting: Oncology

## 2018-12-19 ENCOUNTER — Other Ambulatory Visit: Payer: Self-pay

## 2018-12-19 ENCOUNTER — Observation Stay: Payer: No Typology Code available for payment source

## 2018-12-19 DIAGNOSIS — C50319 Malignant neoplasm of lower-inner quadrant of unspecified female breast: Secondary | ICD-10-CM | POA: Diagnosis not present

## 2018-12-19 DIAGNOSIS — C50312 Malignant neoplasm of lower-inner quadrant of left female breast: Secondary | ICD-10-CM | POA: Diagnosis not present

## 2018-12-19 DIAGNOSIS — R1013 Epigastric pain: Secondary | ICD-10-CM

## 2018-12-19 DIAGNOSIS — R109 Unspecified abdominal pain: Secondary | ICD-10-CM

## 2018-12-19 DIAGNOSIS — D61810 Antineoplastic chemotherapy induced pancytopenia: Secondary | ICD-10-CM

## 2018-12-19 DIAGNOSIS — R911 Solitary pulmonary nodule: Secondary | ICD-10-CM

## 2018-12-19 DIAGNOSIS — R7401 Elevation of levels of liver transaminase levels: Secondary | ICD-10-CM | POA: Insufficient documentation

## 2018-12-19 DIAGNOSIS — D61818 Other pancytopenia: Secondary | ICD-10-CM | POA: Diagnosis not present

## 2018-12-19 DIAGNOSIS — D696 Thrombocytopenia, unspecified: Secondary | ICD-10-CM

## 2018-12-19 DIAGNOSIS — R6510 Systemic inflammatory response syndrome (SIRS) of non-infectious origin without acute organ dysfunction: Secondary | ICD-10-CM | POA: Diagnosis not present

## 2018-12-19 DIAGNOSIS — Z87891 Personal history of nicotine dependence: Secondary | ICD-10-CM

## 2018-12-19 DIAGNOSIS — R748 Abnormal levels of other serum enzymes: Secondary | ICD-10-CM

## 2018-12-19 LAB — BASIC METABOLIC PANEL
Anion gap: 14 (ref 5–15)
BUN: 11 mg/dL (ref 6–20)
CO2: 24 mmol/L (ref 22–32)
Calcium: 9 mg/dL (ref 8.9–10.3)
Chloride: 97 mmol/L — ABNORMAL LOW (ref 98–111)
Creatinine, Ser: 0.63 mg/dL (ref 0.44–1.00)
GFR calc Af Amer: >60 mL/min (ref >60)
GFR calc non Af Amer: >60 mL/min (ref >60)
Glucose, Bld: 153 mg/dL — ABNORMAL HIGH (ref 70–99)
Potassium: 4.3 mmol/L (ref 3.5–5.1)
Sodium: 135 mmol/L (ref 135–145)

## 2018-12-19 LAB — CBC WITH DIFFERENTIAL/PLATELET
Abs Immature Granulocytes: 0.01 10*3/uL (ref 0.00–0.07)
Basophils Absolute: 0 10*3/uL (ref 0.0–0.1)
Basophils Relative: 0 %
Eosinophils Absolute: 0 10*3/uL (ref 0.0–0.5)
Eosinophils Relative: 0 %
HCT: 33.6 % — ABNORMAL LOW (ref 36.0–46.0)
Hemoglobin: 11.8 g/dL — ABNORMAL LOW (ref 12.0–15.0)
Immature Granulocytes: 0 %
Lymphocytes Relative: 8 %
Lymphs Abs: 0.2 10*3/uL — ABNORMAL LOW (ref 0.7–4.0)
MCH: 33.1 pg (ref 26.0–34.0)
MCHC: 35.1 g/dL (ref 30.0–36.0)
MCV: 94.4 fL (ref 80.0–100.0)
Monocytes Absolute: 0.1 10*3/uL (ref 0.1–1.0)
Monocytes Relative: 4 %
Neutro Abs: 2.2 10*3/uL (ref 1.7–7.7)
Neutrophils Relative %: 88 %
Platelets: 47 10*3/uL — ABNORMAL LOW (ref 150–400)
RBC: 3.56 MIL/uL — ABNORMAL LOW (ref 3.87–5.11)
RDW: 16.1 % — ABNORMAL HIGH (ref 11.5–15.5)
WBC: 2.5 10*3/uL — ABNORMAL LOW (ref 4.0–10.5)
nRBC: 0 % (ref 0.0–0.2)

## 2018-12-19 LAB — PROTIME-INR
INR: 1.3 — ABNORMAL HIGH (ref 0.8–1.2)
Prothrombin Time: 15.7 seconds — ABNORMAL HIGH (ref 11.4–15.2)

## 2018-12-19 LAB — HEPATIC FUNCTION PANEL
ALT: 115 U/L — ABNORMAL HIGH (ref 0–44)
AST: 173 U/L — ABNORMAL HIGH (ref 15–41)
Albumin: 3.4 g/dL — ABNORMAL LOW (ref 3.5–5.0)
Alkaline Phosphatase: 104 U/L (ref 38–126)
Bilirubin, Direct: 0.3 mg/dL — ABNORMAL HIGH (ref 0.0–0.2)
Indirect Bilirubin: 0.9 mg/dL (ref 0.3–0.9)
Total Bilirubin: 1.2 mg/dL (ref 0.3–1.2)
Total Protein: 6.8 g/dL (ref 6.5–8.1)

## 2018-12-19 LAB — TYPE AND SCREEN
ABO/RH(D): A POS
Antibody Screen: NEGATIVE

## 2018-12-19 LAB — HIV ANTIBODY (ROUTINE TESTING W REFLEX): HIV Screen 4th Generation wRfx: NONREACTIVE

## 2018-12-19 LAB — SARS CORONAVIRUS 2 (TAT 6-24 HRS): SARS Coronavirus 2: NEGATIVE

## 2018-12-19 MED ORDER — GADOBUTROL 1 MMOL/ML IV SOLN
6.0000 mL | Freq: Once | INTRAVENOUS | Status: AC | PRN
  Administered 2018-12-19: 6 mL via INTRAVENOUS

## 2018-12-19 MED ORDER — IOHEXOL 350 MG/ML SOLN
100.0000 mL | Freq: Once | INTRAVENOUS | Status: AC | PRN
  Administered 2018-12-19: 100 mL via INTRAVENOUS

## 2018-12-19 MED ORDER — LORAZEPAM 2 MG/ML IJ SOLN
1.0000 mg | Freq: Once | INTRAMUSCULAR | Status: AC
  Administered 2018-12-19: 1 mg via INTRAVENOUS
  Filled 2018-12-19: qty 1

## 2018-12-19 MED ORDER — CHLORHEXIDINE GLUCONATE CLOTH 2 % EX PADS
6.0000 | MEDICATED_PAD | Freq: Every day | CUTANEOUS | Status: DC
  Administered 2018-12-20: 6 via TOPICAL

## 2018-12-19 MED ORDER — ALPRAZOLAM 0.25 MG PO TABS: 0.2500 mg | ORAL_TABLET | Freq: Every evening | ORAL | Status: DC | PRN

## 2018-12-19 NOTE — ED Notes (Signed)
This RN to bedside, introduced self to patient, pt is alert and oriented, pt up to the bathroom with standby assist at this time. Pt back to bed without incident. Explained will return with medications. Pt states understanding at this time.

## 2018-12-19 NOTE — ED Notes (Signed)
While this RN at bedside, pt states concerns regarding receiving Vanc infusion due to having a reaction that included her forehead turning red and her head itching when it was administered previously in the evening. Pt states she last received benadryl at approx 0400 this morning. Dr. Posey Pronto notified via secure chat regarding patient concerns.

## 2018-12-19 NOTE — ED Notes (Signed)
Pt on phone 

## 2018-12-19 NOTE — Consult Note (Signed)
Shelby Lame, MD [facility]  [address], [place], [place] [address] Phone: [phone] Fax : [phone]  Consultation  Referring Provider:     Dr. Hal Hope Primary Care Physician:  Jerrol Banana., MD Primary Gastroenterologist: Althia Forts         Reason for Consultation:     Abnormal liver enzymes  Date of Admission:  12/18/2018 Date of Consultation:  12/19/2018         HPI:   Shelby [name] is a 48 y.o. female who has a history of breast cancer that she states was diagnosed about a year ago.  The patient has had intermittent increased LFTs and came in with some abdominal pain that she reports is better now that she was started on antibiotics but her liver enzymes were elevated approximately 8 months ago. The labs showed:  Component     Latest Ref Rng & Units 02/28/2018 03/04/2018 03/07/2018 03/14/2018  AST     15 - 41 U/L 25 35 38 78 (H)  ALT     0 - 44 U/L 17 39 44 120 (H)  Alkaline Phosphatase     38 - 126 U/L 70 65 56 52  Total Bilirubin     0.3 - 1.2 mg/dL 0.5 0.8 0.8 0.4   Component     Latest Ref Rng & Units 03/21/2018 03/25/2018 03/28/2018 04/04/2018  AST     15 - 41 U/L 74 (H) 101 (H) 66 (H) 44 (H)  ALT     0 - 44 U/L 154 (H) 167 (H) 130 (H) 79 (H)  Alkaline Phosphatase     38 - 126 U/L 55 63 63 59  Total Bilirubin     0.3 - 1.2 mg/dL 0.5 0.7 0.5 0.5   Component     Latest Ref Rng & Units 04/18/2018 04/25/2018 05/02/2018 05/16/2018  AST     15 - 41 U/L 57 (H) 44 (H) 47 (H) 54 (H)  ALT     0 - 44 U/L 77 (H) 63 (H) 61 (H) 70 (H)  Alkaline Phosphatase     38 - 126 U/L 77 61 66 90  Total Bilirubin     0.3 - 1.2 mg/dL 0.8 0.4 0.6 0.4   Component     Latest Ref Rng & Units 05/23/2018 05/30/2018 06/13/2018 06/20/2018  AST     15 - 41 U/L 57 (H) 39 33 32  ALT     0 - 44 U/L 83 (H) 50 (H) 38 46 (H)  Alkaline Phosphatase     38 - 126 U/L 72 61 90 71  Total Bilirubin     0.3 - 1.2 mg/dL 0.4 0.4 0.5 0.6   Component     Latest Ref Rng & Units 10/09/2018  12/16/2018 12/18/2018 12/19/2018  AST     15 - 41 U/L 26 139 (H) 188 (H) 173 (H)  ALT     0 - 44 U/L 22 80 (H) 119 (H) 115 (H)  Alkaline Phosphatase     38 - 126 U/L 55 112 125 104  Total Bilirubin     0.3 - 1.2 mg/dL 0.6 1.0 1.2 1.2   The patient had a CT scan of the abdomen that showed:  CT abdomen/pelvis:  1. Diffusely heterogeneous liver with nodular contours. Liver has increased in size since December 2019 CT. This may be due to underlying cirrhotic change versus diffuse metastatic disease. Recommend further evaluation with abdominal MRI. Left upper quadrant and  retroperitoneal collaterals can be seen with portal hypertension. Left portal vein is attenuated, however no discrete thrombus. 2. Questionable pre-pyloric gastric wall thickening, peptic ulcer disease versus gastritis. 3. Bladder wall thickening, small volume abdominopelvic ascites, and areas of colonic wall thickening at the splenic flexure and descending colon are unchanged from CT yesterday. 4. Gallbladder is decompressed and not well assessed, intraluminal high density may be stones or sludge. Questionable gallbladder wall thickening may be related to nondistention or liver disease. This could be further evaluated with MRI or right upper quadrant ultrasound.  These findings were different from her previous CT scan of the abdomen back on December 4 of 2019.  Past Medical History:  Diagnosis Date   Anemia    h/o with pregnancy   Anxiety    Asthma    allergy induced-no inhaler   Cancer ([place])    breast left    Complication of anesthesia    Environmental allergies    Family history of adverse reaction to anesthesia    son-breathing problems-coded 1st time when he was 2 and had another surgery at 62 and had to be admitted for breathing problems   Family history of breast cancer    GERD (gastroesophageal reflux disease)    Headache    migraines   Hypertension    Mitral valve disease    Mitral valve  prolapse    Painful menstrual periods    PONV (postoperative nausea and vomiting)    Vertigo     Past Surgical History:  Procedure Laterality Date   ABDOMINAL HYSTERECTOMY  2010   supracervical    AXILLARY LYMPH NODE DISSECTION Left 07/12/2018   Procedure: AXILLARY LYMPH NODE DISSECTION;  Surgeon: Robert Bellow, MD;  Location: [facility] ORS;  Service: General;  Laterality: Left;   BREAST BIOPSY Left 11/2017   invasive ductal carcinoma and metastatic LN   BREAST BIOPSY WITH SENTINEL LYMPH NODE BIOPSY AND NEEDLE LOCALIZATION Left 07/12/2018   Procedure: BREAST BIOPSY WIDE EXCISION WITH SENTINEL NODE  AND NEEDLE LOCALIZATION OF [name] LEFT;  Surgeon: Robert Bellow, MD;  Location: [facility] ORS;  Service: General;  Laterality: Left;   BUNIONECTOMY Bilateral    MANDIBLE RECONSTRUCTION     age 82-underbite   PORTACATH PLACEMENT Right 12/31/2017   Procedure: INSERTION PORT-A-CATH;  Surgeon: Robert Bellow, MD;  Location: [facility] ORS;  Service: General;  Laterality: Right;   RE-EXCISION OF BREAST LUMPECTOMY Left 07/24/2018   Procedure: RE-EXCISION OF BREAST LUMPECTOMY LEFT;  Surgeon: Robert Bellow, MD;  Location: [facility] ORS;  Service: General;  Laterality: Left;   SHOULDER ARTHROSCOPY WITH OPEN ROTATOR CUFF REPAIR Right 04/26/2017   Procedure: SHOULDER ARTHROSCOPY WITH OPEN ROTATOR CUFF REPAIR,SUBACROMINAL DECOMPRESSION;  Surgeon: Thornton Park, MD;  Location: [facility] ORS;  Service: Orthopedics;  Laterality: Right;   TONSILLECTOMY      Prior to Admission medications   Medication Sig Start Date End Date Taking? Authorizing Provider  capecitabine (XELODA) 500 MG tablet Take 4 tablets (2,000 mg total) by mouth 2 (two) times daily after a meal. Take for 14 days, then hold for 7 days. Repeat every 21 days. 10/10/18  Yes Lloyd Huger, MD  Cholecalciferol (VITAMIN D) 50 MCG (2000 UT) CAPS Take 2,000 Units by mouth every evening.    Yes [provider]  famotidine  (PEPCID) 20 MG tablet Take 20 mg by mouth daily as needed for heartburn.    Yes [provider]  ibuprofen (ADVIL,MOTRIN) 200 MG tablet Take 200 mg by mouth  daily as needed for mild pain.    Yes [provider]  losartan (COZAAR) 50 MG tablet TAKE 1 TABLET BY MOUTH DAILY Patient taking differently: Take 50 mg by mouth daily.  11/16/18  Yes Jerrol Banana., MD  mometasone (NASONEX) 50 MCG/ACT nasal spray Place 2 sprays into the nose daily. Patient taking differently: Place 2 sprays into the nose daily as needed (allergies).  04/18/18  Yes Finnegan, [name], MD  montelukast (SINGULAIR) 10 MG tablet TAKE 1 TABLET BY MOUTH AT BEDTIME Patient taking differently: Take 10 mg by mouth daily as needed (allergies).  04/17/18  Yes Jerrol Banana., MD  Omega-3 1000 MG CAPS Take 1,000 mg by mouth every evening.    Yes [provider]  sucralfate (CARAFATE) 1 g tablet Take 1 tablet (1 g total) by mouth 4 (four) times daily -  with meals and at bedtime. 12/17/18  Yes Jerrol Banana., MD  ALPRAZolam Duanne Moron) 0.5 MG tablet Take 0.5-1 tablets (0.25-0.5 mg total) by mouth 2 (two) times daily as needed for anxiety. Patient not taking: Reported on 11/22/2018 06/11/18   Verlon Au, NP  EPINEPHrine 0.3 mg/0.3 mL IJ SOAJ injection Inject 0.3 mLs (0.3 mg total) into the muscle once. Patient not taking: Reported on 11/08/2018 03/29/15   Jerrol Banana., MD  omeprazole (PRILOSEC) 20 MG capsule Take 1 capsule (20 mg total) by mouth daily. 12/17/18   Jerrol Banana., MD  prochlorperazine (COMPAZINE) 10 MG tablet Take 1 tablet (10 mg total) by mouth every 6 (six) hours as needed (Nausea or vomiting). 12/24/17 10/09/18  Lloyd Huger, MD    Family History  Problem Relation Age of Onset   Breast cancer Mother 63   Diabetes Father    Cancer Maternal Uncle        colon   Breast cancer Maternal Grandmother 70     Social History   Tobacco Use    Smoking status: Former Smoker    Packs/day: 0.50    Years: 10.00    Pack years: 5.00    Types: Cigarettes    Quit date: 04/20/2007    Years since quitting: 11.6   Smokeless tobacco: Never Used   Tobacco comment: social smoker back then  Substance Use Topics   Alcohol use: Yes    Comment: occas   Drug use: No    Allergies as of 12/18/2018 - Review Complete 12/18/2018  Allergen Reaction Noted   Hydrocodone Hives 07/05/2018   Iodine Hives 04/12/2017   Peanut butter flavor Other (See Comments) 12/25/2017   Shellfish allergy Hives 12/07/2014    Review of Systems:    All systems reviewed and negative except where noted in HPI.   Physical Exam:  Vital signs in last 24 hours: Temp:  [99.4 F (37.4 C)] 99.4 F (37.4 C) (11/18 1422) Pulse Rate:  [73-124] 106 (11/19 1245) Resp:  [13-22] 15 (11/19 1245) BP: (122-149)/(80-99) 137/96 (11/19 1230) SpO2:  [93 %-100 %] 98 % (11/19 1245)   General:   Pleasant, cooperative in NAD Head:  Normocephalic and atraumatic. Eyes:   No icterus.   Conjunctiva pink. PERRLA. Ears:  Normal auditory acuity. Neck:  Supple; no masses or thyroidomegaly Lungs: Respirations even and unlabored. Lungs clear to auscultation bilaterally.   No wheezes, crackles, or rhonchi.  Heart:  Regular rate and rhythm;  Without murmur, clicks, rubs or gallops Abdomen:  Soft, nondistended, nontender. Normal bowel sounds. No appreciable masses or hepatomegaly.  No rebound or guarding.  Rectal:  Not performed. Msk:  Symmetrical without gross deformities.    Extremities:  Without edema, cyanosis or clubbing. Neurologic:  Alert and oriented x3;  grossly normal neurologically. Skin:  Intact without significant lesions or rashes. Cervical Nodes:  No significant cervical adenopathy. Psych:  Alert and cooperative. Normal affect.  LAB RESULTS: Recent Labs    12/18/18 1433 12/19/18 0557  WBC 3.6* 2.5*  HGB 11.5* 11.8*  HCT 33.4* 33.6*  PLT 46* 47*   BMET Recent  Labs    12/18/18 1433 12/19/18 0557  NA 135 135  K 4.2 4.3  CL 96* 97*  CO2 26 24  GLUCOSE 137* 153*  BUN 9 11  CREATININE 0.71 0.63  CALCIUM 9.3 9.0   LFT Recent Labs    12/19/18 0557  PROT 6.8  ALBUMIN 3.4*  AST 173*  ALT 115*  ALKPHOS 104  BILITOT 1.2  BILIDIR 0.3*  IBILI 0.9   PT/INR No results for input(s): LABPROT, INR in the last 72 hours.  STUDIES: Ct Abdomen Pelvis Wo Contrast  Addendum Date: 12/18/2018   ADDENDUM REPORT: 12/18/2018 20:18 ADDENDUM: These results were called by telephone on 12/18/2018 at 8:18 pm to provider [facility] , who verbally acknowledged these results. Electronically Signed   By: Lovena Le M.D.   On: 12/18/2018 20:18   Result Date: 12/18/2018 CLINICAL DATA:  Abdominal pain and distension, undergoing chemotherapy for breast cancer, history of IBS and diverticulitis EXAM: CT ABDOMEN AND PELVIS WITHOUT CONTRAST TECHNIQUE: Multidetector CT imaging of the abdomen and pelvis was performed following the standard protocol without IV contrast. COMPARISON:  CT abdomen pelvis 01/01/2018 FINDINGS: Lower chest: The patchy areas of subpleural ground-glass opacity could reflect atelectasis or early infection. Cardiac size is normal. Trace pericardial effusion, similar to prior. Hepatobiliary: Ill-defined regions of hypoattenuation are seen the anterior left lobe liver. Slightly nodular hepatic surface contour which is new from comparison exam. These features are both incompletely assessed in the absence of contrast media. Gallbladder appears largely contracted at the time of exam. No biliary ductal dilatation. Pancreas: Unremarkable. No pancreatic ductal dilatation or surrounding inflammatory changes. Spleen: Normal in size without focal abnormality. Adrenals/Urinary Tract: No suspicious adrenal lesions. No visible or contour deforming renal lesions. No urolithiasis or hydronephrosis mild circumferential bladder wall thickening. Stomach/Bowel: Distal  esophagus, stomach and duodenal sweep are unremarkable. No small bowel wall thickening or dilatation. No evidence of obstruction. Appendix is not clearly identified. No pericecal inflammation. There is some mild mural thickening of the splenic flexure and descending colon remaining portions of the colon are free of mural thickening or dilatation. Vascular/Lymphatic: Upper abdominal venous collateralization and splenorenal shunting is noted. No suspicious or enlarged lymph nodes in the included lymphatic chains. Reproductive: Uterus is surgically absent. No concerning adnexal lesions. Other: Small volume of low-attenuation ascites noted in the low abdomen. Small volume of perihepatic low-attenuation ascites is present as well. Musculoskeletal: Multilevel degenerative changes are present in the imaged portions of the spine. No acute osseous abnormality or suspicious osseous lesion. IMPRESSION: 1. Interval development of cirrhotic stigmata including a nodular liver surface contour and increasing upper abdominal venous collateralization. Small volume of ascites is noted as well. 2. Ill-defined regions of hypoattenuation are seen within the anterior left lobe liver, which are incompletely assessed in the absence of contrast media. Furthermore findings are worrisome in the setting of known malignancy and potential intrinsic liver disease. Consider further evaluation with MRI or ultrasound if patient is unable to  tolerate CT contrast due to allergy. 3. Mild mural thickening of the splenic flexure and descending colon, could reflect change related to the additional cirrhotic findings/portal colopathy though should exclude a an acute colitis clinically. 4. Patchy areas of subpleural ground-glass opacity in the visualized lung bases could reflect atelectasis or early infection. 5. Mild circumferential bladder wall thickening, which may be related to underdistention. Correlate with urinalysis to exclude cystitis. 6. Unchanged  trace pericardial effusion. Electronically Signed: By: Lovena Le M.D. On: 12/18/2018 20:07   Ct Angio Chest Pe W And/or Wo Contrast  Result Date: 12/19/2018 CLINICAL DATA:  Chest pain, complex, intermediate/high prob of ACS/PE/AAS; Abd distension Epigastric pain. Exertional dyspnea. Cough. Weakness. History of breast cancer. EXAM: CT ANGIOGRAPHY CHEST CT ABDOMEN AND PELVIS WITH CONTRAST TECHNIQUE: Multidetector CT imaging of the chest was performed using the standard protocol during bolus administration of intravenous contrast. Multiplanar CT image reconstructions and MIPs were obtained to evaluate the vascular anatomy. Multidetector CT imaging of the abdomen and pelvis was performed using the standard protocol during bolus administration of intravenous contrast. No reported contrast reaction. CONTRAST:  161mL OMNIPAQUE IOHEXOL 350 MG/ML SOLN COMPARISON:  Abdominal CT without contrast yesterday. Chest CT 01/01/2018 FINDINGS: CTA CHEST FINDINGS Cardiovascular: There are no filling defects within the pulmonary arteries to suggest pulmonary embolus. Thoracic aorta is normal in caliber without dissection. Left vertebral artery arises directly from the thoracic aorta, variant arch anatomy. Heart is normal in size. No pericardial effusion. Right chest port in place with tip in the SVC. Mediastinum/Nodes: No mediastinal or hilar adenopathy. No visualized thyroid nodule. No esophageal wall thickening. Lungs/Pleura: Mild patchy areas of subpleural nodular and ground-glass opacities. Basilar findings are unchanged from CT yesterday. 5 mm right upper lobe pulmonary nodule series 6, image 41. Sub solid nodularity slightly more inferiorly in the right lung series 6, image 45 measuring 8 mm. Tiny 2 mm nodule in the right upper lung series 6, image 32, may be fissural. Pulmonary nodules are new from prior chest CT. Perifissural in subpleural opacity in the right upper lobe, also series 6, image 32, nonspecific. No  pulmonary edema. No pleural fluid. Trachea and mainstem bronchi are patent. Musculoskeletal: There are no acute or suspicious osseous abnormalities. Review of the MIP images confirms the above findings. CT ABDOMEN and PELVIS FINDINGS Hepatobiliary: Hepatic parenchyma diffusely heterogeneous with nodular contours. Liver is increased in size from December 2019 CT. Gallbladder is decompressed and not well assessed, intraluminal high density may be stones or sludge. Questionable wall thickening may be related to nondistention. Pancreas: No ductal dilatation or inflammation. Spleen: Normal in size without focal abnormality. Adrenals/Urinary Tract: Normal adrenal glands. No hydronephrosis or perinephric edema. Homogeneous renal enhancement with symmetric excretion on delayed phase imaging. Urinary bladder is partially distended, mild bladder wall thickening. Stomach/Bowel: Lack of enteric contrast and paucity of intra-abdominal fat limits detailed bowel assessment. The stomach is nondistended. Questionable pre-pyloric gastric wall thickening. No small bowel obstruction or inflammatory change. Appendix not discretely visualized, no pericecal inflammation to suggest appendicitis. Transverse colon is tortuous. Mild colonic wall thickening at the splenic flexure, colon is decompressed, this is not well assessed. Questionable areas of descending colonic wall thickening, for example series 11, image 61. Vascular/Lymphatic: Right portal vein is patent. The left portal vein is attenuated without evidence of thrombus. Tortuous splenic vein with prominent collaterals in the left upper quadrant and left retroperitoneum. 9 mm perigastric lymph node series 11, image 29, nonspecific. No pelvic adenopathy. Reproductive: Uterus surgically absent. Left  ovary tentatively visualized and normal. Right ovary not definitively seen. No obvious adnexal mass. Other: Small volume of pelvic ascites measuring simple fluid density, similar to  yesterday. Small perihepatic ascites anteriorly. No free air. Musculoskeletal: There are no acute or suspicious osseous abnormalities. Scattered bone islands in the pelvis. Review of the MIP images confirms the above findings. IMPRESSION: CT chest: 1. No pulmonary embolus. 2. Mild patchy areas of subpleural nodular and ground-glass opacities in both lungs. Slightly more confluent subpleural/perifissural right upper lobe opacity. Findings may be due to atelectasis or infection. 3. Small pulmonary nodules are new from December, infectious/inflammatory or metastatic. CT abdomen/pelvis: 1. Diffusely heterogeneous liver with nodular contours. Liver has increased in size since December 2019 CT. This may be due to underlying cirrhotic change versus diffuse metastatic disease. Recommend further evaluation with abdominal MRI. Left upper quadrant and retroperitoneal collaterals can be seen with portal hypertension. Left portal vein is attenuated, however no discrete thrombus. 2. Questionable pre-pyloric gastric wall thickening, peptic ulcer disease versus gastritis. 3. Bladder wall thickening, small volume abdominopelvic ascites, and areas of colonic wall thickening at the splenic flexure and descending colon are unchanged from CT yesterday. 4. Gallbladder is decompressed and not well assessed, intraluminal high density may be stones or sludge. Questionable gallbladder wall thickening may be related to nondistention or liver disease. This could be further evaluated with MRI or right upper quadrant ultrasound. Electronically Signed   By: Keith Rake M.D.   On: 12/19/2018 05:33   Ct Abdomen Pelvis W Contrast  Result Date: 12/19/2018 CLINICAL DATA:  Chest pain, complex, intermediate/high prob of ACS/PE/AAS; Abd distension Epigastric pain. Exertional dyspnea. Cough. Weakness. History of breast cancer. EXAM: CT ANGIOGRAPHY CHEST CT ABDOMEN AND PELVIS WITH CONTRAST TECHNIQUE: Multidetector CT imaging of the chest was  performed using the standard protocol during bolus administration of intravenous contrast. Multiplanar CT image reconstructions and MIPs were obtained to evaluate the vascular anatomy. Multidetector CT imaging of the abdomen and pelvis was performed using the standard protocol during bolus administration of intravenous contrast. No reported contrast reaction. CONTRAST:  124mL OMNIPAQUE IOHEXOL 350 MG/ML SOLN COMPARISON:  Abdominal CT without contrast yesterday. Chest CT 01/01/2018 FINDINGS: CTA CHEST FINDINGS Cardiovascular: There are no filling defects within the pulmonary arteries to suggest pulmonary embolus. Thoracic aorta is normal in caliber without dissection. Left vertebral artery arises directly from the thoracic aorta, variant arch anatomy. Heart is normal in size. No pericardial effusion. Right chest port in place with tip in the SVC. Mediastinum/Nodes: No mediastinal or hilar adenopathy. No visualized thyroid nodule. No esophageal wall thickening. Lungs/Pleura: Mild patchy areas of subpleural nodular and ground-glass opacities. Basilar findings are unchanged from CT yesterday. 5 mm right upper lobe pulmonary nodule series 6, image 41. Sub solid nodularity slightly more inferiorly in the right lung series 6, image 45 measuring 8 mm. Tiny 2 mm nodule in the right upper lung series 6, image 32, may be fissural. Pulmonary nodules are new from prior chest CT. Perifissural in subpleural opacity in the right upper lobe, also series 6, image 32, nonspecific. No pulmonary edema. No pleural fluid. Trachea and mainstem bronchi are patent. Musculoskeletal: There are no acute or suspicious osseous abnormalities. Review of the MIP images confirms the above findings. CT ABDOMEN and PELVIS FINDINGS Hepatobiliary: Hepatic parenchyma diffusely heterogeneous with nodular contours. Liver is increased in size from December 2019 CT. Gallbladder is decompressed and not well assessed, intraluminal high density may be stones or  sludge. Questionable wall thickening may be related  to nondistention. Pancreas: No ductal dilatation or inflammation. Spleen: Normal in size without focal abnormality. Adrenals/Urinary Tract: Normal adrenal glands. No hydronephrosis or perinephric edema. Homogeneous renal enhancement with symmetric excretion on delayed phase imaging. Urinary bladder is partially distended, mild bladder wall thickening. Stomach/Bowel: Lack of enteric contrast and paucity of intra-abdominal fat limits detailed bowel assessment. The stomach is nondistended. Questionable pre-pyloric gastric wall thickening. No small bowel obstruction or inflammatory change. Appendix not discretely visualized, no pericecal inflammation to suggest appendicitis. Transverse colon is tortuous. Mild colonic wall thickening at the splenic flexure, colon is decompressed, this is not well assessed. Questionable areas of descending colonic wall thickening, for example series 11, image 61. Vascular/Lymphatic: Right portal vein is patent. The left portal vein is attenuated without evidence of thrombus. Tortuous splenic vein with prominent collaterals in the left upper quadrant and left retroperitoneum. 9 mm perigastric lymph node series 11, image 29, nonspecific. No pelvic adenopathy. Reproductive: Uterus surgically absent. Left ovary tentatively visualized and normal. Right ovary not definitively seen. No obvious adnexal mass. Other: Small volume of pelvic ascites measuring simple fluid density, similar to yesterday. Small perihepatic ascites anteriorly. No free air. Musculoskeletal: There are no acute or suspicious osseous abnormalities. Scattered bone islands in the pelvis. Review of the MIP images confirms the above findings. IMPRESSION: CT chest: 1. No pulmonary embolus. 2. Mild patchy areas of subpleural nodular and ground-glass opacities in both lungs. Slightly more confluent subpleural/perifissural right upper lobe opacity. Findings may be due to  atelectasis or infection. 3. Small pulmonary nodules are new from December, infectious/inflammatory or metastatic. CT abdomen/pelvis: 1. Diffusely heterogeneous liver with nodular contours. Liver has increased in size since December 2019 CT. This may be due to underlying cirrhotic change versus diffuse metastatic disease. Recommend further evaluation with abdominal MRI. Left upper quadrant and retroperitoneal collaterals can be seen with portal hypertension. Left portal vein is attenuated, however no discrete thrombus. 2. Questionable pre-pyloric gastric wall thickening, peptic ulcer disease versus gastritis. 3. Bladder wall thickening, small volume abdominopelvic ascites, and areas of colonic wall thickening at the splenic flexure and descending colon are unchanged from CT yesterday. 4. Gallbladder is decompressed and not well assessed, intraluminal high density may be stones or sludge. Questionable gallbladder wall thickening may be related to nondistention or liver disease. This could be further evaluated with MRI or right upper quadrant ultrasound. Electronically Signed   By: Keith Rake M.D.   On: 12/19/2018 05:33   Dg Chest Portable 1 View  Result Date: 12/18/2018 CLINICAL DATA:  Fever abdominal pain EXAM: PORTABLE CHEST 1 VIEW COMPARISON:  12/31/2017 FINDINGS: Right-sided central venous port with tip over the cavoatrial region. No focal airspace disease or effusion. Normal heart size. No pneumothorax. IMPRESSION: No active disease. Electronically Signed   By: Donavan Foil M.D.   On: 12/18/2018 19:49      Impression / Plan:   Assessment: Active Problems:   Primary cancer of lower-inner quadrant of left female breast ([place])   Abdominal pain   Pancytopenia ([place])   KAYLNN HEAVENER is a 48 y.o. y/o female with with a history of breast cancer who has had abnormal liver enzymes off and on for some time but now the liver enzymes have come back significantly higher than before.  The patient  also had a CT scan with multiple abnormalities found on it including thickening of the bladder, questionable prepyloric gastric wall thickening, left upper quadrant and retroperitoneal collaterals that can be seen with portal hypertension and colonic wall  thickening at the splenic flexure and descending colon.  I am now being asked to see the patient for her abnormal liver enzymes and findings mentioned above.  Plan:  The most definitive way to find out what is going on with this patient's liver whether it is metastatic disease or cirrhosis would be with a liver biopsy.  If the patient has cirrhosis then further work-up including an upper endoscopy may be needed to look for why the prepyloric gastric wall is thickened as well as further work-up for why the cirrhosis has developed since 11 months ago.  If this is metastatic disease then will readdress with oncology the prognosis and treatment options.  The patient's platelets are low at 47 with yesterday being 60 therefore a transjugular liver biopsy may be needed versus platelet infusion prior to biopsy.  I will also check an INR today.  The patient has been explained the plan and agrees with it.  Thank you for involving me in the care of this patient.      LOS: 0 days   Shelby Lame, MD  12/19/2018, 1:35 PM Pager [phone] 7am-5pm  Check AMION for 5pm -7am coverage and on weekends   Note: This dictation was prepared with Dragon dictation along with smaller phrase technology. Any transcriptional errors that result from this process are unintentional.

## 2018-12-19 NOTE — Consult Note (Signed)
[place]  Telephone:([phone]) [phone] Fax:(336) [phone]  ID: Shelby Shelby [name] OB: 05/09/1970  MR#: [id number]  [id number]  Patient Care Team: Shelby Shelby [name]., MD as PCP - General (Family Medicine) Shelby Situ [name], MD as Consulting Physician (Cardiology)  CHIEF COMPLAINT: Abdominal pain, likely progressive metastatic triple negative breast cancer.  INTERVAL HISTORY: Patient is a 48 year old female who presented to the emergency room with increasing abdominal pain subsequent imaging including MRI earlier this evening revealed diffuse infiltration of the liver strongly suspected to be metastatic disease.  Patient is anxious, but feels improved.  She has no neurologic complaints.  She denies any recent fevers or illnesses.  She has a fair appetite, but denies weight loss.  She has no chest pain, shortness of breath, cough, or hemoptysis.  She continues to have abdominal pain, but admits it has improved.  She denies any nausea, vomiting, constipation, or diarrhea.  She has no urinary complaints.  Patient offers no further specific complaints today.  REVIEW OF SYSTEMS:   Review of Systems  Constitutional: Negative.  Negative for fever and malaise/fatigue.  Respiratory: Negative.  Negative for cough, hemoptysis and shortness of breath.   Cardiovascular: Negative.  Negative for chest pain and leg swelling.  Gastrointestinal: Positive for abdominal pain. Negative for constipation, diarrhea, melena, nausea and vomiting.  Genitourinary: Negative.  Negative for hematuria.  Musculoskeletal: Negative.  Negative for back pain.  Skin: Negative.  Negative for rash.  Neurological: Negative.  Negative for dizziness, focal weakness, weakness and headaches.  Psychiatric/Behavioral: The patient is nervous/anxious.     As per HPI. Otherwise, a complete review of systems is negative.  PAST MEDICAL HISTORY: Past Medical History:  Diagnosis Date   Anemia    h/o with  pregnancy   Anxiety    Asthma    allergy induced-no inhaler   Cancer ([place])    breast Shelby [name]    Complication of anesthesia    Environmental allergies    Family history of adverse reaction to anesthesia    son-breathing problems-coded 1st time when he was 2 and had another surgery at 54 and had to be admitted for breathing problems   Family history of breast cancer    GERD (gastroesophageal reflux disease)    Headache    migraines   Hypertension    Mitral valve disease    Mitral valve prolapse    Painful menstrual periods    PONV (postoperative nausea and vomiting)    Vertigo     PAST SURGICAL HISTORY: Past Surgical History:  Procedure Laterality Date   ABDOMINAL HYSTERECTOMY  2010   supracervical    AXILLARY LYMPH NODE DISSECTION Shelby [name] 07/12/2018   Procedure: AXILLARY LYMPH NODE DISSECTION;  Surgeon: Shelby Bellow, MD;  Location: [facility] OR[facility];  Service: General;  Laterality: Shelby [name];   BREAST BIOPSY Shelby [name] 11/2017   invasive ductal carcinoma and metastatic LN   BREAST BIOPSY WITH SENTINEL LYMPH NODE BIOPSY AND NEEDLE LOCALIZATION Shelby [name] 07/12/2018   Procedure: BREAST BIOPSY WIDE EXCISION WITH SENTINEL NODE  AND NEEDLE LOCALIZATION OF Shelby Shelby [name];  Surgeon: Shelby Bellow, MD;  Location: [facility];  Service: General;  Laterality: Shelby [name];   BUNIONECTOMY Bilateral    MANDIBLE RECONSTRUCTION     age 74-underbite   PORTACATH PLACEMENT Right 12/31/2017   Procedure: INSERTION PORT-A-CATH;  Surgeon: Shelby Bellow, MD;  Location: [facility];  Service: General;  Laterality: Right;   RE-EXCISION OF BREAST LUMPECTOMY Shelby [name] 07/24/2018   Procedure: RE-EXCISION OF BREAST LUMPECTOMY Shelby [name];  Surgeon: Shelby Bellow, MD;  Location: [facility];  Service: General;  Laterality: Shelby [name];   SHOULDER ARTHROSCOPY WITH OPEN ROTATOR CUFF REPAIR Right 04/26/2017   Procedure: SHOULDER ARTHROSCOPY WITH OPEN ROTATOR CUFF REPAIR,SUBACROMINAL DECOMPRESSION;  Surgeon: Shelby Park, MD;   Location: [facility];  Service: Orthopedics;  Laterality: Right;   TONSILLECTOMY      FAMILY HISTORY: Family History  Problem Relation Age of Onset   Breast cancer Mother 62   Diabetes Father    Cancer Maternal Uncle        colon   Breast cancer Maternal Grandmother 11    ADVANCED DIRECTIVES (Y/N):  @ADVDIR @  HEALTH MAINTENANCE: Social History   Tobacco Use   Smoking status: Former Smoker    Packs/day: 0.50    Years: 10.00    Pack years: 5.00    Types: Cigarettes    Quit date: 04/20/2007    Years since quitting: 11.6   Smokeless tobacco: Never Used   Tobacco comment: social smoker back then  Substance Use Topics   Alcohol use: Yes    Comment: occas   Drug use: No     Colonoscopy:  PAP:  Bone density:  Lipid panel:  Allergies  Allergen Reactions   Hydrocodone Hives    Swollen face - many years ago. Thinks she has tolerated since.   Iodine Hives   Peanut Butter Flavor Other (See Comments)    Made mouth tingle   Shellfish Allergy Hives   Vancomycin Other (See Comments)    Itching and forehead turned red    Current Facility-Administered Medications  Medication Dose Route Frequency Provider Last Rate Last Dose   acetaminophen (TYLENOL) tablet 650 mg  650 mg Oral Q6H PRN Shelby Patience, MD       Or   acetaminophen (TYLENOL) suppository 650 mg  650 mg Rectal Q6H PRN Shelby Patience, MD       ALPRAZolam Shelby Shelby [name]) tablet 0.25 mg  0.25 mg Oral QHS PRN Shelby Mandes, MD       ceFEPIme (MAXIPIME) 2 g in sodium chloride 0.9 % 100 mL IVPB  2 g Intravenous Q8H Shelby Patience, MD   Stopped at 12/19/18 1523   [START ON 12/20/2018] Chlorhexidine Gluconate Cloth 2 % PADS 6 each  6 each Topical Q0600 Shelby Mandes, MD       cholecalciferol (VITAMIN D3) tablet 2,000 Units  2,000 Units Oral QPM Shelby Patience, MD   2,000 Units at 12/19/18 1814   famotidine (PEPCID) tablet 20 mg  20 mg Oral Daily PRN Shelby Patience, MD       losartan  (COZAAR) tablet 50 mg  50 mg Oral Daily Shelby Patience, MD   50 mg at 12/19/18 1026   omega-3 acid ethyl esters (LOVAZA) capsule 1,000 mg  1,000 mg Oral QPM Shelby Patience, MD   1,000 mg at 12/19/18 1814   ondansetron (ZOFRAN) tablet 4 mg  4 mg Oral Q6H PRN Shelby Patience, MD       Or   ondansetron [facility]) injection 4 mg  4 mg Intravenous Q6H PRN Shelby Patience, MD       pantoprazole (PROTONIX) EC tablet 40 mg  40 mg Oral Daily Shelby Patience, MD   40 mg at 12/19/18 1026   sucralfate (CARAFATE) tablet 1 g  1 g Oral TID WC & HS Shelby Patience, MD   1 g at 12/19/18 1617    OBJECTIVE: Vitals:   12/19/18 1744 12/19/18  1823  BP:  124/87  Pulse: 100 97  Resp: 20 18  Temp:  97.9 F (36.6 C)  SpO2:  100%     Body mass index is 23.34 kg/m.    ECOG FS:1 - Symptomatic but completely ambulatory  General: Well-developed, well-nourished, no acute distress. Eyes: Pink conjunctiva, anicteric sclera. HEENT: Normocephalic, moist mucous membranes, clear oropharnyx. Lungs: Clear to auscultation bilaterally. Heart: Regular rate and rhythm. No rubs, murmurs, or gallops. Abdomen: Soft, nontender, nondistended. No organomegaly noted, normoactive bowel sounds. Musculoskeletal: No edema, cyanosis, or clubbing. Neuro: Alert, answering all questions appropriately. Cranial nerves grossly intact. Skin: No rashes or petechiae noted. Psych: Normal affect. Lymphatics: No cervical, calvicular, axillary or inguinal LAD.   LAB RESULTS:  Lab Results  Component Value Date   NA 135 12/19/2018   K 4.3 12/19/2018   CL 97 (L) 12/19/2018   CO2 24 12/19/2018   GLUCOSE 153 (H) 12/19/2018   BUN 11 12/19/2018   CREATININE 0.63 12/19/2018   CALCIUM 9.0 12/19/2018   PROT 6.8 12/19/2018   ALBUMIN 3.4 (L) 12/19/2018   AST 173 (H) 12/19/2018   ALT 115 (H) 12/19/2018   ALKPHOS 104 12/19/2018   BILITOT 1.2 12/19/2018   GFRNONAA >60 12/19/2018   GFRAA >60 12/19/2018    Lab  Results  Component Value Date   WBC 2.5 (L) 12/19/2018   NEUTROABS 2.2 12/19/2018   HGB 11.8 (L) 12/19/2018   HCT 33.6 (L) 12/19/2018   MCV 94.4 12/19/2018   PLT 47 (L) 12/19/2018     STUDIES: Ct Abdomen Pelvis Wo Contrast  Addendum Date: 12/18/2018   ADDENDUM REPORT: 12/18/2018 20:18 ADDENDUM: These results were called by telephone on 12/18/2018 at 8:18 pm to provider [facility] , who verbally acknowledged these results. Electronically Signed   By: Lovena Le M.D.   On: 12/18/2018 20:18   Result Date: 12/18/2018 CLINICAL DATA:  Abdominal pain and distension, undergoing chemotherapy for breast cancer, history of IBS and diverticulitis EXAM: CT ABDOMEN AND PELVIS WITHOUT CONTRAST TECHNIQUE: Multidetector CT imaging of the abdomen and pelvis was performed following the standard protocol without IV contrast. COMPARISON:  CT abdomen pelvis 01/01/2018 FINDINGS: Lower chest: The patchy areas of subpleural ground-glass opacity could reflect atelectasis or early infection. Cardiac size is normal. Trace pericardial effusion, similar to prior. Hepatobiliary: Ill-defined regions of hypoattenuation are seen the anterior Shelby [name] lobe liver. Slightly nodular hepatic surface contour which is new from comparison exam. These features are both incompletely assessed in the absence of contrast media. Gallbladder appears largely contracted at the time of exam. No biliary ductal dilatation. Pancreas: Unremarkable. No pancreatic ductal dilatation or surrounding inflammatory changes. Spleen: Normal in size without focal abnormality. Adrenals/Urinary Tract: No suspicious adrenal lesions. No visible or contour deforming renal lesions. No urolithiasis or hydronephrosis mild circumferential bladder wall thickening. Stomach/Bowel: Distal esophagus, stomach and duodenal sweep are unremarkable. No small bowel wall thickening or dilatation. No evidence of obstruction. Appendix is not clearly identified. No pericecal inflammation.  There is some mild mural thickening of the splenic flexure and descending colon remaining portions of the colon are free of mural thickening or dilatation. Vascular/Lymphatic: Upper abdominal venous collateralization and splenorenal shunting is noted. No suspicious or enlarged lymph nodes in the included lymphatic chains. Reproductive: Uterus is surgically absent. No concerning adnexal lesions. Other: Small volume of low-attenuation ascites noted in the low abdomen. Small volume of perihepatic low-attenuation ascites is present as well. Musculoskeletal: Multilevel degenerative changes are present in the imaged portions of the  spine. No acute osseous abnormality or suspicious osseous lesion. IMPRESSION: 1. Interval development of cirrhotic stigmata including a nodular liver surface contour and increasing upper abdominal venous collateralization. Small volume of ascites is noted as well. 2. Ill-defined regions of hypoattenuation are seen within the anterior Shelby [name] lobe liver, which are incompletely assessed in the absence of contrast media. Furthermore findings are worrisome in the setting of known malignancy and potential intrinsic liver disease. Consider further evaluation with MRI or ultrasound if patient is unable to tolerate CT contrast due to allergy. 3. Mild mural thickening of the splenic flexure and descending colon, could reflect change related to the additional cirrhotic findings/portal colopathy though should exclude a an acute colitis clinically. 4. Patchy areas of subpleural ground-glass opacity in the visualized lung bases could reflect atelectasis or early infection. 5. Mild circumferential bladder wall thickening, which may be related to underdistention. Correlate with urinalysis to exclude cystitis. 6. Unchanged trace pericardial effusion. Electronically Signed: By: Lovena Le M.D. On: 12/18/2018 20:07   Ct Angio Chest Pe W And/or Wo Contrast  Result Date: 12/19/2018 CLINICAL DATA:  Chest pain,  complex, intermediate/high prob of ACS/PE/AAS; Abd distension Epigastric pain. Exertional dyspnea. Cough. Weakness. History of breast cancer. EXAM: CT ANGIOGRAPHY CHEST CT ABDOMEN AND PELVIS WITH CONTRAST TECHNIQUE: Multidetector CT imaging of the chest was performed using the standard protocol during bolus administration of intravenous contrast. Multiplanar CT image reconstructions and MIPs were obtained to evaluate the vascular anatomy. Multidetector CT imaging of the abdomen and pelvis was performed using the standard protocol during bolus administration of intravenous contrast. No reported contrast reaction. CONTRAST:  113mL OMNIPAQUE IOHEXOL 350 MG/ML SOLN COMPARISON:  Abdominal CT without contrast yesterday. Chest CT 01/01/2018 FINDINGS: CTA CHEST FINDINGS Cardiovascular: There are no filling defects within the pulmonary arteries to suggest pulmonary embolus. Thoracic aorta is normal in caliber without dissection. Shelby [name] vertebral artery arises directly from the thoracic aorta, variant arch anatomy. Heart is normal in size. No pericardial effusion. Right chest port in place with tip in the SVC. Mediastinum/Nodes: No mediastinal or hilar adenopathy. No visualized thyroid nodule. No esophageal wall thickening. Lungs/Pleura: Mild patchy areas of subpleural nodular and ground-glass opacities. Basilar findings are unchanged from CT yesterday. 5 mm right upper lobe pulmonary nodule series 6, image 41. Sub solid nodularity slightly more inferiorly in the right lung series 6, image 45 measuring 8 mm. Tiny 2 mm nodule in the right upper lung series 6, image 32, may be fissural. Pulmonary nodules are new from prior chest CT. Perifissural in subpleural opacity in the right upper lobe, also series 6, image 32, nonspecific. No pulmonary edema. No pleural fluid. Trachea and mainstem bronchi are patent. Musculoskeletal: There are no acute or suspicious osseous abnormalities. Review of the MIP images confirms the above  findings. CT ABDOMEN and PELVIS FINDINGS Hepatobiliary: Hepatic parenchyma diffusely heterogeneous with nodular contours. Liver is increased in size from December 2019 CT. Gallbladder is decompressed and not well assessed, intraluminal high density may be stones or sludge. Questionable wall thickening may be related to nondistention. Pancreas: No ductal dilatation or inflammation. Spleen: Normal in size without focal abnormality. Adrenals/Urinary Tract: Normal adrenal glands. No hydronephrosis or perinephric edema. Homogeneous renal enhancement with symmetric excretion on delayed phase imaging. Urinary bladder is partially distended, mild bladder wall thickening. Stomach/Bowel: Lack of enteric contrast and paucity of intra-abdominal fat limits detailed bowel assessment. The stomach is nondistended. Questionable pre-pyloric gastric wall thickening. No small bowel obstruction or inflammatory change. Appendix not discretely visualized, no  pericecal inflammation to suggest appendicitis. Transverse colon is tortuous. Mild colonic wall thickening at the splenic flexure, colon is decompressed, this is not well assessed. Questionable areas of descending colonic wall thickening, for example series 11, image 61. Vascular/Lymphatic: Right portal vein is patent. The Shelby [name] portal vein is attenuated without evidence of thrombus. Tortuous splenic vein with prominent collaterals in the Shelby [name] upper quadrant and Shelby [name] retroperitoneum. 9 mm perigastric lymph node series 11, image 29, nonspecific. No pelvic adenopathy. Reproductive: Uterus surgically absent. Shelby [name] ovary tentatively visualized and normal. Right ovary not definitively seen. No obvious adnexal mass. Other: Small volume of pelvic ascites measuring simple fluid density, similar to yesterday. Small perihepatic ascites anteriorly. No free air. Musculoskeletal: There are no acute or suspicious osseous abnormalities. Scattered bone islands in the pelvis. Review of the MIP images  confirms the above findings. IMPRESSION: CT chest: 1. No pulmonary embolus. 2. Mild patchy areas of subpleural nodular and ground-glass opacities in both lungs. Slightly more confluent subpleural/perifissural right upper lobe opacity. Findings may be due to atelectasis or infection. 3. Small pulmonary nodules are new from December, infectious/inflammatory or metastatic. CT abdomen/pelvis: 1. Diffusely heterogeneous liver with nodular contours. Liver has increased in size since December 2019 CT. This may be due to underlying cirrhotic change versus diffuse metastatic disease. Recommend further evaluation with abdominal MRI. Shelby [name] upper quadrant and retroperitoneal collaterals can be seen with portal hypertension. Shelby [name] portal vein is attenuated, however no discrete thrombus. 2. Questionable pre-pyloric gastric wall thickening, peptic ulcer disease versus gastritis. 3. Bladder wall thickening, small volume abdominopelvic ascites, and areas of colonic wall thickening at the splenic flexure and descending colon are unchanged from CT yesterday. 4. Gallbladder is decompressed and not well assessed, intraluminal high density may be stones or sludge. Questionable gallbladder wall thickening may be related to nondistention or liver disease. This could be further evaluated with MRI or right upper quadrant ultrasound. Electronically Signed   By: Keith Rake M.D.   On: 12/19/2018 05:33   Mr Abdomen W Wo Contrast  Result Date: 12/19/2018 CLINICAL DATA:  Abdominal pain in a patient with breast cancer. EXAM: MRI ABDOMEN WITHOUT AND WITH CONTRAST TECHNIQUE: Multiplanar multisequence MR imaging of the abdomen was performed both before and after the administration of intravenous contrast. CONTRAST:  106mL GADAVIST GADOBUTROL 1 MMOL/ML IV SOLN COMPARISON:  CT of 12/19/2018 FINDINGS: Lower chest: Limited assessment of the lower chest on MRI is unremarkable. Hepatobiliary: Diffuse infiltration of much of the liver, nearly the  entire Shelby [name] hepatic lobe, medial section and lateral section in addition to anterior right hepatic lobe with confluent infiltrative restricted diffusion and heterogeneous enhancement measuring approximately 21 by 7.4 cm. Diffuse metastatic disease is strongly suspected. Boundaries are well-demarcated without adjacent edema in the bordering hepatic parenchyma Discrete hepatic lesions are also scattered about the right hepatic lobe largest in the dome of the right hemi liver measuring approximately 2.5 cm in greatest dimension. These discrete lesions display a targetoid appearance. There are numerous lesions in the right hepatic lobe, lesions are found in all hepatic subsegment. Pancreas: No mass, inflammatory changes, or other parenchymal abnormality identified. Mild ductal distension in the tail of the pancreas may be due to adjacent mass effect. No visible pancreatic lesion is noted. Spleen:  Within normal limits in size and appearance. Adrenals/Urinary Tract: No masses identified. No evidence of hydronephrosis. Stomach/Bowel: Limited assessment of the gastrointestinal tract without signs of acute process. Vascular/Lymphatic: Shelby [name]-sided peri-renal venous collaterals of uncertain significance perhaps related to elevated portal pressures in  the setting of diffuse hepatic involvement. The Shelby [name] portal vein is occluded. Right portal vein is patent. Scattered small lymph nodes in the upper abdomen. There is either slow flow or thrombus within the main portal vein at the level of the splenic portal confluence extending in the splenic vein. The SMV is patent. There is mass effect upon the vessel at the level of the splenic portal confluence due to hepatic enlargement. This is best seen on subtraction images, image 54, series 20. Note that slow flow in an otherwise opacified vessel can cause artifact with a similar appearance however, this is seen on contrasted and non contrasted imaging with a similar appearance. Other:   None. Musculoskeletal: No signs of acute or destructive bone process. IMPRESSION: 1. Diffuse infiltration of much of the liver, nearly the entire Shelby [name] hepatic lobe, with confluent infiltrative restricted diffusion and heterogeneous enhancement measuring approximately 21 by 7.4 cm. Diffuse metastatic disease is strongly suspected with multifocal lesions in the right hepatic lobe. Superimposed infection is difficult to exclude though areas at the boundary of these lesions show no overt signs of edema. 2. Highly narrowed or occluded Shelby [name] portal vein with potential thrombus versus slow flow in the splenoportal confluence. This was patent on the exam of 12/19/2018; however, given above findings a venous phase CT or even ultrasound hepatic Doppler with special attention to the splenic portal confluence may be helpful to exclude this possibility, finding is also exhibited on TrueFISP sequence (image 24, series 10). 3. Shelby [name] perirenal venous collaterals of likely related to chronic narrowing and or occlusion of Shelby [name] renal vein with "nutcracker" configuration. 4. Mild ductal distension in the tail of the pancreas, potentially due to mass effect. Attention on follow-up. 5. Critical Value/emergent results were called by telephone at the time of interpretation on 12/19/2018 at 6:43 pm to [place] , who verbally acknowledged these results. Electronically Signed   By: Zetta Bills M.D.   On: 12/19/2018 18:24   US Abdomen Complete  Result Date: 12/19/2018 CLINICAL DATA:  Abdominal pain EXAM: ABDOMEN ULTRASOUND COMPLETE COMPARISON:  MRI earlier today FINDINGS: Gallbladder: Gallbladder is contracted with associated mild wall thickening measuring up to 4 mm. Sludge noted within the gallbladder. No visible stones. Common bile duct: Diameter: Normal caliber, 3 mm Liver: Diffusely heterogeneous echotexture throughout the liver corresponding to the abnormality seen on today's MRI suspicious forinfiltrating mass. Main  portal vein is patent on color Doppler imaging with normal direction of blood flow towards the liver. The area of possible thrombus seen on MRI at the level of the splenic portal confluence not visualized. IVC: No abnormality visualized. Pancreas: Visualized portion unremarkable. Spleen: Size and appearance within normal limits. Right Kidney: Length: 11.0 cm. Echogenicity within normal limits. No mass or hydronephrosis visualized. Shelby [name] Kidney: Length: 10.5 cm. Echogenicity within normal limits. No mass or hydronephrosis visualized. Abdominal aorta: No aneurysm visualized. Other findings: None. IMPRESSION: Heterogeneous appearance throughout much of the Shelby [name] hepatic lobe as seen on today's MRI concerning for infiltrating mass/tumor. Main portal vein is patent. Contracted gallbladder with gallbladder wall thickening likely related to contracted state. Sludge within the gallbladder. No sonographic Murphy sign or visible stones. Electronically Signed   By: Rolm Baptise M.D.   On: 12/19/2018 20:35   Ct Abdomen Pelvis W Contrast  Result Date: 12/19/2018 CLINICAL DATA:  Chest pain, complex, intermediate/high prob of ACS/PE/AAS; Abd distension Epigastric pain. Exertional dyspnea. Cough. Weakness. History of breast cancer. EXAM: CT ANGIOGRAPHY CHEST CT ABDOMEN AND PELVIS WITH CONTRAST TECHNIQUE: Multidetector  CT imaging of the chest was performed using the standard protocol during bolus administration of intravenous contrast. Multiplanar CT image reconstructions and MIPs were obtained to evaluate the vascular anatomy. Multidetector CT imaging of the abdomen and pelvis was performed using the standard protocol during bolus administration of intravenous contrast. No reported contrast reaction. CONTRAST:  19mL OMNIPAQUE IOHEXOL 350 MG/ML SOLN COMPARISON:  Abdominal CT without contrast yesterday. Chest CT 01/01/2018 FINDINGS: CTA CHEST FINDINGS Cardiovascular: There are no filling defects within the pulmonary arteries to  suggest pulmonary embolus. Thoracic aorta is normal in caliber without dissection. Shelby [name] vertebral artery arises directly from the thoracic aorta, variant arch anatomy. Heart is normal in size. No pericardial effusion. Right chest port in place with tip in the SVC. Mediastinum/Nodes: No mediastinal or hilar adenopathy. No visualized thyroid nodule. No esophageal wall thickening. Lungs/Pleura: Mild patchy areas of subpleural nodular and ground-glass opacities. Basilar findings are unchanged from CT yesterday. 5 mm right upper lobe pulmonary nodule series 6, image 41. Sub solid nodularity slightly more inferiorly in the right lung series 6, image 45 measuring 8 mm. Tiny 2 mm nodule in the right upper lung series 6, image 32, may be fissural. Pulmonary nodules are new from prior chest CT. Perifissural in subpleural opacity in the right upper lobe, also series 6, image 32, nonspecific. No pulmonary edema. No pleural fluid. Trachea and mainstem bronchi are patent. Musculoskeletal: There are no acute or suspicious osseous abnormalities. Review of the MIP images confirms the above findings. CT ABDOMEN and PELVIS FINDINGS Hepatobiliary: Hepatic parenchyma diffusely heterogeneous with nodular contours. Liver is increased in size from December 2019 CT. Gallbladder is decompressed and not well assessed, intraluminal high density may be stones or sludge. Questionable wall thickening may be related to nondistention. Pancreas: No ductal dilatation or inflammation. Spleen: Normal in size without focal abnormality. Adrenals/Urinary Tract: Normal adrenal glands. No hydronephrosis or perinephric edema. Homogeneous renal enhancement with symmetric excretion on delayed phase imaging. Urinary bladder is partially distended, mild bladder wall thickening. Stomach/Bowel: Lack of enteric contrast and paucity of intra-abdominal fat limits detailed bowel assessment. The stomach is nondistended. Questionable pre-pyloric gastric wall thickening.  No small bowel obstruction or inflammatory change. Appendix not discretely visualized, no pericecal inflammation to suggest appendicitis. Transverse colon is tortuous. Mild colonic wall thickening at the splenic flexure, colon is decompressed, this is not well assessed. Questionable areas of descending colonic wall thickening, for example series 11, image 61. Vascular/Lymphatic: Right portal vein is patent. The Shelby [name] portal vein is attenuated without evidence of thrombus. Tortuous splenic vein with prominent collaterals in the Shelby [name] upper quadrant and Shelby [name] retroperitoneum. 9 mm perigastric lymph node series 11, image 29, nonspecific. No pelvic adenopathy. Reproductive: Uterus surgically absent. Shelby [name] ovary tentatively visualized and normal. Right ovary not definitively seen. No obvious adnexal mass. Other: Small volume of pelvic ascites measuring simple fluid density, similar to yesterday. Small perihepatic ascites anteriorly. No free air. Musculoskeletal: There are no acute or suspicious osseous abnormalities. Scattered bone islands in the pelvis. Review of the MIP images confirms the above findings. IMPRESSION: CT chest: 1. No pulmonary embolus. 2. Mild patchy areas of subpleural nodular and ground-glass opacities in both lungs. Slightly more confluent subpleural/perifissural right upper lobe opacity. Findings may be due to atelectasis or infection. 3. Small pulmonary nodules are new from December, infectious/inflammatory or metastatic. CT abdomen/pelvis: 1. Diffusely heterogeneous liver with nodular contours. Liver has increased in size since December 2019 CT. This may be due to underlying cirrhotic change versus diffuse metastatic  disease. Recommend further evaluation with abdominal MRI. Shelby [name] upper quadrant and retroperitoneal collaterals can be seen with portal hypertension. Shelby [name] portal vein is attenuated, however no discrete thrombus. 2. Questionable pre-pyloric gastric wall thickening, peptic ulcer disease  versus gastritis. 3. Bladder wall thickening, small volume abdominopelvic ascites, and areas of colonic wall thickening at the splenic flexure and descending colon are unchanged from CT yesterday. 4. Gallbladder is decompressed and not well assessed, intraluminal high density may be stones or sludge. Questionable gallbladder wall thickening may be related to nondistention or liver disease. This could be further evaluated with MRI or right upper quadrant ultrasound. Electronically Signed   By: Keith Rake M.D.   On: 12/19/2018 05:33   Dg Chest Portable 1 View  Result Date: 12/18/2018 CLINICAL DATA:  Fever abdominal pain EXAM: PORTABLE CHEST 1 VIEW COMPARISON:  12/31/2017 FINDINGS: Right-sided central venous port with tip over the cavoatrial region. No focal airspace disease or effusion. Normal heart size. No pneumothorax. IMPRESSION: No active disease. Electronically Signed   By: Donavan Foil M.D.   On: 12/18/2018 19:49    ASSESSMENT: Abdominal pain, likely progressive metastatic triple negative breast cancer.  PLAN:   1.  Triple negative breast cancer:  CT scan results from January 01, 2018 did not reveal any metastatic disease.  Patient completed her neoadjuvant chemotherapy with Adriamycin and Cytoxan, followed by carboplatinum and Taxol, on Jun 20, 2018.  Her initial resection was completed on July 12, 2018 and then she underwent reexcision for positive margins on July 24, 2018.  Patient completed adjuvant XRT on October 16, 2018.    Patient is now receiving consolidation chemotherapy with capecitabine.  Imaging results reviewed independently which appears to be significant progression of disease in patient's liver.  She has biopsy scheduled tomorrow to confirm.  Have ordered a CA 27-29 for completeness. 2.  Pancytopenia: Likely secondary to capecitabine as above, which has been discontinued. 3.  Thrombocytopenia: If no improvement in CBC tomorrow, patient may require 1 unit platelets prior  to biopsy. 4.  Elevated liver enzymes: Likely secondary to infiltrative disease seen on MRI.  Appreciate GI input.   5.  Pulmonary nodules: Although appear to be infectious/laboratory, these are new since last imaging and given the appearance of liver on MRI highly suspicious for metastatic disease.  Appreciate consult, will follow.  Lloyd Huger, MD   12/19/2018 8:53 PM

## 2018-12-19 NOTE — ED Notes (Signed)
Provider at bedside with pt

## 2018-12-19 NOTE — ED Notes (Signed)
Pt remains off unit at MRI

## 2018-12-19 NOTE — ED Notes (Signed)
Verbal from [place] that pt okay to try crackers. States pt to switch to NPO at midnight. Pt given crackers/snack as requested.

## 2018-12-19 NOTE — ED Notes (Signed)
Pt unhooked to go to bathroom.  

## 2018-12-19 NOTE — ED Notes (Signed)
VORB from Dr. Posey Pronto to D/C vancomycin and place orders for clear liquid diet. This RN Winfield Rast, Pharmacist to notify him of vanc discontinuation.

## 2018-12-19 NOTE — ED Notes (Signed)
Pt given two basins, soap, shampoo, facial/body towels, tooth brush and tooth paste as requested.

## 2018-12-19 NOTE — Progress Notes (Signed)
[facility] NAME: Shelby [name]    MR#:  [id number]  DATE OF BIRTH:  [birth year]  SUBJECTIVE:  patient very anxious about her CT scan results. Her abdominal pain feels better. Tolerating clear liquid diet. Agreeable to full liquid. No high-grade fever no diarrhea nausea vomiting REVIEW OF SYSTEMS:   Review of Systems  Constitutional: Positive for malaise/fatigue. Negative for chills, fever and weight loss.  HENT: Negative for ear discharge, ear pain and nosebleeds.   Eyes: Negative for blurred vision, pain and discharge.  Respiratory: Negative for sputum production, shortness of breath, wheezing and stridor.   Cardiovascular: Negative for chest pain, palpitations, orthopnea and PND.  Gastrointestinal: Positive for abdominal pain. Negative for diarrhea, nausea and vomiting.  Genitourinary: Negative for frequency and urgency.  Musculoskeletal: Negative for back pain and joint pain.  Neurological: Positive for weakness. Negative for sensory change, speech change and focal weakness.  Psychiatric/Behavioral: Negative for depression and hallucinations. The patient is nervous/anxious.    Tolerating Diet: clear liquid Tolerating PT:   DRUG ALLERGIES:   Allergies  Allergen Reactions  . Hydrocodone Hives    Swollen face - many years ago. Thinks she has tolerated since.  . Iodine Hives  . Peanut Butter Flavor Other (See Comments)    Made mouth tingle  . Shellfish Allergy Hives  . Vancomycin Other (See Comments)    Itching and forehead turned red    VITALS:  Blood pressure (!) 137/96, pulse (!) 106, temperature 99.4 F (37.4 C), temperature source Oral, resp. rate 15, SpO2 98 %.  PHYSICAL EXAMINATION:   Physical Exam  GENERAL:  48 y.o.-year-old patient lying in the bed with no acute distress. Anxious EYES: Pupils equal, round, reactive to light and accommodation. No scleral icterus. Extraocular muscles intact.  HEENT: Head  atraumatic, normocephalic. Oropharynx and nasopharynx clear.  NECK:  Supple, no jugular venous distention. No thyroid enlargement, no tenderness.  LUNGS: Normal breath sounds bilaterally, no wheezing, rales, rhonchi. No use of accessory muscles of respiration.  CARDIOVASCULAR: S1, S2 normal. No murmurs, rubs, or gallops.  ABDOMEN: Soft, mild epigastric tenderness, nondistended. Bowel sounds present. No organomegaly or mass.  EXTREMITIES: No cyanosis, clubbing or edema b/l.    NEUROLOGIC: Cranial nerves II through XII are intact. No focal Motor or sensory deficits b/l.   PSYCHIATRIC:  patient is alert and oriented x 3.  SKIN: No obvious rash, lesion, or ulcer.   LABORATORY PANEL:  CBC Recent Labs  Lab 12/19/18 0557  WBC 2.5*  HGB 11.8*  HCT 33.6*  PLT 47*    Chemistries  Recent Labs  Lab 12/19/18 0557  NA 135  K 4.3  CL 97*  CO2 24  GLUCOSE 153*  BUN 11  CREATININE 0.63  CALCIUM 9.0  AST 173*  ALT 115*  ALKPHOS 104  BILITOT 1.2   Cardiac Enzymes No results for input(s): TROPONINI in the last 168 hours. RADIOLOGY:  Ct Abdomen Pelvis Wo Contrast  Addendum Date: 12/18/2018   ADDENDUM REPORT: 12/18/2018 20:18 ADDENDUM: These results were called by telephone on 12/18/2018 at 8:18 pm to provider [facility] , who verbally acknowledged these results. Electronically Signed   By: Lovena Le M.D.   On: 12/18/2018 20:18   Result Date: 12/18/2018 CLINICAL DATA:  Abdominal pain and distension, undergoing chemotherapy for breast cancer, history of IBS and diverticulitis EXAM: CT ABDOMEN AND PELVIS WITHOUT CONTRAST TECHNIQUE: Multidetector CT imaging of the abdomen and pelvis was performed following the  standard protocol without IV contrast. COMPARISON:  CT abdomen pelvis 01/01/2018 FINDINGS: Lower chest: The patchy areas of subpleural ground-glass opacity could reflect atelectasis or early infection. Cardiac size is normal. Trace pericardial effusion, similar to prior. Hepatobiliary:  Ill-defined regions of hypoattenuation are seen the anterior left lobe liver. Slightly nodular hepatic surface contour which is new from comparison exam. These features are both incompletely assessed in the absence of contrast media. Gallbladder appears largely contracted at the time of exam. No biliary ductal dilatation. Pancreas: Unremarkable. No pancreatic ductal dilatation or surrounding inflammatory changes. Spleen: Normal in size without focal abnormality. Adrenals/Urinary Tract: No suspicious adrenal lesions. No visible or contour deforming renal lesions. No urolithiasis or hydronephrosis mild circumferential bladder wall thickening. Stomach/Bowel: Distal esophagus, stomach and duodenal sweep are unremarkable. No small bowel wall thickening or dilatation. No evidence of obstruction. Appendix is not clearly identified. No pericecal inflammation. There is some mild mural thickening of the splenic flexure and descending colon remaining portions of the colon are free of mural thickening or dilatation. Vascular/Lymphatic: Upper abdominal venous collateralization and splenorenal shunting is noted. No suspicious or enlarged lymph nodes in the included lymphatic chains. Reproductive: Uterus is surgically absent. No concerning adnexal lesions. Other: Small volume of low-attenuation ascites noted in the low abdomen. Small volume of perihepatic low-attenuation ascites is present as well. Musculoskeletal: Multilevel degenerative changes are present in the imaged portions of the spine. No acute osseous abnormality or suspicious osseous lesion. IMPRESSION: 1. Interval development of cirrhotic stigmata including a nodular liver surface contour and increasing upper abdominal venous collateralization. Small volume of ascites is noted as well. 2. Ill-defined regions of hypoattenuation are seen within the anterior left lobe liver, which are incompletely assessed in the absence of contrast media. Furthermore findings are  worrisome in the setting of known malignancy and potential intrinsic liver disease. Consider further evaluation with MRI or ultrasound if patient is unable to tolerate CT contrast due to allergy. 3. Mild mural thickening of the splenic flexure and descending colon, could reflect change related to the additional cirrhotic findings/portal colopathy though should exclude a an acute colitis clinically. 4. Patchy areas of subpleural ground-glass opacity in the visualized lung bases could reflect atelectasis or early infection. 5. Mild circumferential bladder wall thickening, which may be related to underdistention. Correlate with urinalysis to exclude cystitis. 6. Unchanged trace pericardial effusion. Electronically Signed: By: Lovena Le M.D. On: 12/18/2018 20:07   Ct Angio Chest Pe W And/or Wo Contrast  Result Date: 12/19/2018 CLINICAL DATA:  Chest pain, complex, intermediate/high prob of ACS/PE/AAS; Abd distension Epigastric pain. Exertional dyspnea. Cough. Weakness. History of breast cancer. EXAM: CT ANGIOGRAPHY CHEST CT ABDOMEN AND PELVIS WITH CONTRAST TECHNIQUE: Multidetector CT imaging of the chest was performed using the standard protocol during bolus administration of intravenous contrast. Multiplanar CT image reconstructions and MIPs were obtained to evaluate the vascular anatomy. Multidetector CT imaging of the abdomen and pelvis was performed using the standard protocol during bolus administration of intravenous contrast. No reported contrast reaction. CONTRAST:  159mL OMNIPAQUE IOHEXOL 350 MG/ML SOLN COMPARISON:  Abdominal CT without contrast yesterday. Chest CT 01/01/2018 FINDINGS: CTA CHEST FINDINGS Cardiovascular: There are no filling defects within the pulmonary arteries to suggest pulmonary embolus. Thoracic aorta is normal in caliber without dissection. Left vertebral artery arises directly from the thoracic aorta, variant arch anatomy. Heart is normal in size. No pericardial effusion. Right  chest port in place with tip in the SVC. Mediastinum/Nodes: No mediastinal or hilar adenopathy. No visualized thyroid  nodule. No esophageal wall thickening. Lungs/Pleura: Mild patchy areas of subpleural nodular and ground-glass opacities. Basilar findings are unchanged from CT yesterday. 5 mm right upper lobe pulmonary nodule series 6, image 41. Sub solid nodularity slightly more inferiorly in the right lung series 6, image 45 measuring 8 mm. Tiny 2 mm nodule in the right upper lung series 6, image 32, may be fissural. Pulmonary nodules are new from prior chest CT. Perifissural in subpleural opacity in the right upper lobe, also series 6, image 32, nonspecific. No pulmonary edema. No pleural fluid. Trachea and mainstem bronchi are patent. Musculoskeletal: There are no acute or suspicious osseous abnormalities. Review of the MIP images confirms the above findings. CT ABDOMEN and PELVIS FINDINGS Hepatobiliary: Hepatic parenchyma diffusely heterogeneous with nodular contours. Liver is increased in size from December 2019 CT. Gallbladder is decompressed and not well assessed, intraluminal high density may be stones or sludge. Questionable wall thickening may be related to nondistention. Pancreas: No ductal dilatation or inflammation. Spleen: Normal in size without focal abnormality. Adrenals/Urinary Tract: Normal adrenal glands. No hydronephrosis or perinephric edema. Homogeneous renal enhancement with symmetric excretion on delayed phase imaging. Urinary bladder is partially distended, mild bladder wall thickening. Stomach/Bowel: Lack of enteric contrast and paucity of intra-abdominal fat limits detailed bowel assessment. The stomach is nondistended. Questionable pre-pyloric gastric wall thickening. No small bowel obstruction or inflammatory change. Appendix not discretely visualized, no pericecal inflammation to suggest appendicitis. Transverse colon is tortuous. Mild colonic wall thickening at the splenic flexure,  colon is decompressed, this is not well assessed. Questionable areas of descending colonic wall thickening, for example series 11, image 61. Vascular/Lymphatic: Right portal vein is patent. The left portal vein is attenuated without evidence of thrombus. Tortuous splenic vein with prominent collaterals in the left upper quadrant and left retroperitoneum. 9 mm perigastric lymph node series 11, image 29, nonspecific. No pelvic adenopathy. Reproductive: Uterus surgically absent. Left ovary tentatively visualized and normal. Right ovary not definitively seen. No obvious adnexal mass. Other: Small volume of pelvic ascites measuring simple fluid density, similar to yesterday. Small perihepatic ascites anteriorly. No free air. Musculoskeletal: There are no acute or suspicious osseous abnormalities. Scattered bone islands in the pelvis. Review of the MIP images confirms the above findings. IMPRESSION: CT chest: 1. No pulmonary embolus. 2. Mild patchy areas of subpleural nodular and ground-glass opacities in both lungs. Slightly more confluent subpleural/perifissural right upper lobe opacity. Findings may be due to atelectasis or infection. 3. Small pulmonary nodules are new from December, infectious/inflammatory or metastatic. CT abdomen/pelvis: 1. Diffusely heterogeneous liver with nodular contours. Liver has increased in size since December 2019 CT. This may be due to underlying cirrhotic change versus diffuse metastatic disease. Recommend further evaluation with abdominal MRI. Left upper quadrant and retroperitoneal collaterals can be seen with portal hypertension. Left portal vein is attenuated, however no discrete thrombus. 2. Questionable pre-pyloric gastric wall thickening, peptic ulcer disease versus gastritis. 3. Bladder wall thickening, small volume abdominopelvic ascites, and areas of colonic wall thickening at the splenic flexure and descending colon are unchanged from CT yesterday. 4. Gallbladder is  decompressed and not well assessed, intraluminal high density may be stones or sludge. Questionable gallbladder wall thickening may be related to nondistention or liver disease. This could be further evaluated with MRI or right upper quadrant ultrasound. Electronically Signed   By: Keith Rake M.D.   On: 12/19/2018 05:33   Ct Abdomen Pelvis W Contrast  Result Date: 12/19/2018 CLINICAL DATA:  Chest pain, complex, intermediate/high  prob of ACS/PE/AAS; Abd distension Epigastric pain. Exertional dyspnea. Cough. Weakness. History of breast cancer. EXAM: CT ANGIOGRAPHY CHEST CT ABDOMEN AND PELVIS WITH CONTRAST TECHNIQUE: Multidetector CT imaging of the chest was performed using the standard protocol during bolus administration of intravenous contrast. Multiplanar CT image reconstructions and MIPs were obtained to evaluate the vascular anatomy. Multidetector CT imaging of the abdomen and pelvis was performed using the standard protocol during bolus administration of intravenous contrast. No reported contrast reaction. CONTRAST:  134mL OMNIPAQUE IOHEXOL 350 MG/ML SOLN COMPARISON:  Abdominal CT without contrast yesterday. Chest CT 01/01/2018 FINDINGS: CTA CHEST FINDINGS Cardiovascular: There are no filling defects within the pulmonary arteries to suggest pulmonary embolus. Thoracic aorta is normal in caliber without dissection. Left vertebral artery arises directly from the thoracic aorta, variant arch anatomy. Heart is normal in size. No pericardial effusion. Right chest port in place with tip in the SVC. Mediastinum/Nodes: No mediastinal or hilar adenopathy. No visualized thyroid nodule. No esophageal wall thickening. Lungs/Pleura: Mild patchy areas of subpleural nodular and ground-glass opacities. Basilar findings are unchanged from CT yesterday. 5 mm right upper lobe pulmonary nodule series 6, image 41. Sub solid nodularity slightly more inferiorly in the right lung series 6, image 45 measuring 8 mm. Tiny 2 mm  nodule in the right upper lung series 6, image 32, may be fissural. Pulmonary nodules are new from prior chest CT. Perifissural in subpleural opacity in the right upper lobe, also series 6, image 32, nonspecific. No pulmonary edema. No pleural fluid. Trachea and mainstem bronchi are patent. Musculoskeletal: There are no acute or suspicious osseous abnormalities. Review of the MIP images confirms the above findings. CT ABDOMEN and PELVIS FINDINGS Hepatobiliary: Hepatic parenchyma diffusely heterogeneous with nodular contours. Liver is increased in size from December 2019 CT. Gallbladder is decompressed and not well assessed, intraluminal high density may be stones or sludge. Questionable wall thickening may be related to nondistention. Pancreas: No ductal dilatation or inflammation. Spleen: Normal in size without focal abnormality. Adrenals/Urinary Tract: Normal adrenal glands. No hydronephrosis or perinephric edema. Homogeneous renal enhancement with symmetric excretion on delayed phase imaging. Urinary bladder is partially distended, mild bladder wall thickening. Stomach/Bowel: Lack of enteric contrast and paucity of intra-abdominal fat limits detailed bowel assessment. The stomach is nondistended. Questionable pre-pyloric gastric wall thickening. No small bowel obstruction or inflammatory change. Appendix not discretely visualized, no pericecal inflammation to suggest appendicitis. Transverse colon is tortuous. Mild colonic wall thickening at the splenic flexure, colon is decompressed, this is not well assessed. Questionable areas of descending colonic wall thickening, for example series 11, image 61. Vascular/Lymphatic: Right portal vein is patent. The left portal vein is attenuated without evidence of thrombus. Tortuous splenic vein with prominent collaterals in the left upper quadrant and left retroperitoneum. 9 mm perigastric lymph node series 11, image 29, nonspecific. No pelvic adenopathy. Reproductive:  Uterus surgically absent. Left ovary tentatively visualized and normal. Right ovary not definitively seen. No obvious adnexal mass. Other: Small volume of pelvic ascites measuring simple fluid density, similar to yesterday. Small perihepatic ascites anteriorly. No free air. Musculoskeletal: There are no acute or suspicious osseous abnormalities. Scattered bone islands in the pelvis. Review of the MIP images confirms the above findings. IMPRESSION: CT chest: 1. No pulmonary embolus. 2. Mild patchy areas of subpleural nodular and ground-glass opacities in both lungs. Slightly more confluent subpleural/perifissural right upper lobe opacity. Findings may be due to atelectasis or infection. 3. Small pulmonary nodules are new from December, infectious/inflammatory or metastatic. CT abdomen/pelvis:  1. Diffusely heterogeneous liver with nodular contours. Liver has increased in size since December 2019 CT. This may be due to underlying cirrhotic change versus diffuse metastatic disease. Recommend further evaluation with abdominal MRI. Left upper quadrant and retroperitoneal collaterals can be seen with portal hypertension. Left portal vein is attenuated, however no discrete thrombus. 2. Questionable pre-pyloric gastric wall thickening, peptic ulcer disease versus gastritis. 3. Bladder wall thickening, small volume abdominopelvic ascites, and areas of colonic wall thickening at the splenic flexure and descending colon are unchanged from CT yesterday. 4. Gallbladder is decompressed and not well assessed, intraluminal high density may be stones or sludge. Questionable gallbladder wall thickening may be related to nondistention or liver disease. This could be further evaluated with MRI or right upper quadrant ultrasound. Electronically Signed   By: Keith Rake M.D.   On: 12/19/2018 05:33   Dg Chest Portable 1 View  Result Date: 12/18/2018 CLINICAL DATA:  Fever abdominal pain EXAM: PORTABLE CHEST 1 VIEW COMPARISON:   12/31/2017 FINDINGS: Right-sided central venous port with tip over the cavoatrial region. No focal airspace disease or effusion. Normal heart size. No pneumothorax. IMPRESSION: No active disease. Electronically Signed   By: Donavan Foil M.D.   On: 12/18/2018 19:49   ASSESSMENT AND PLAN:  ELODA CEDOTAL is a 48 y.o. female with history of stage IIa triple negative invasive carcinoma of the left breast status post neoadjuvant chemotherapy and resection and June 2020 presently on Xeloda presents to the ER because of ongoing epigastric pain over the last 3 to 4 days and also had subjective feeling of fever chills and has recorded a fever 101 F at home.  Denies any nausea vomiting had 1 episode of diarrhea after taking MOM  1. SIRS on presentation with abdominal epigastric pain -no exact source of infection identified. -Some inflammatory changes in splenic fracture and dissenting: -cont IV cefepime -CT scan showing possible ground glass opacity in the lungs ? Mets  less likely infection -COVID-19 negative -advanced to full liquid -IV fluids  2. Epigastric pain with elevated LFTs and possible diffuse heterogeneous changes in the liver worrisome for cirrhosis versus metastasis -seen by Dr. wall G.I. -recommends biopsy of the liver to differentiate. Agreeable by Dr. Grayland Ormond -d/w with Dr. Kathlene Cote  3. Pancytopenia/thrombocytopenia suspected due to ongoing chemotherapy -follow CBC -And cross for possible platelet transfusion prior to biopsy  4. Breast cancer undergoing chemotherapy -Dr. Grayland Ormond to see patient  Family communication : no family in the ER.has updated her family  consults : IR, G.I., oncology Discharge Disposition : home CODE STATUS: full DVT Prophylaxis : SCD-- thrombocytopenia  TOTAL TIME TAKING CARE OF THIS PATIENT: *30* minutes.  >50% time spent on counselling and coordination of care  POSSIBLE D/C IN *1-2* DAYS, DEPENDING ON CLINICAL CONDITION.  Note: This  dictation was prepared with Dragon dictation along with smaller phrase technology. Any transcriptional errors that result from this process are unintentional.  Fritzi Mandes M.D on 12/19/2018 at 3:54 PM  Between 7am to 6pm - Pager - [phone]  After 6pm go to [email or web address]  [facility]   CC: Primary care physician; Jerrol Banana., MDPatient ID: Carolan Clines, female   DOB: 1970/02/24, 48 y.o.   MRN: [id number]

## 2018-12-19 NOTE — ED Notes (Signed)
Patient transported to CT 

## 2018-12-19 NOTE — ED Notes (Addendum)
Pt back to room. Charge RN setting up transport to floor.

## 2018-12-19 NOTE — ED Notes (Signed)
Pt given food tray. Full liquid diet. SST tubes sent with INR tube to lab.

## 2018-12-20 ENCOUNTER — Inpatient Hospital Stay: Payer: No Typology Code available for payment source

## 2018-12-20 DIAGNOSIS — R160 Hepatomegaly, not elsewhere classified: Secondary | ICD-10-CM

## 2018-12-20 DIAGNOSIS — C50919 Malignant neoplasm of unspecified site of unspecified female breast: Secondary | ICD-10-CM | POA: Diagnosis not present

## 2018-12-20 DIAGNOSIS — R1013 Epigastric pain: Secondary | ICD-10-CM | POA: Diagnosis not present

## 2018-12-20 DIAGNOSIS — D61818 Other pancytopenia: Secondary | ICD-10-CM | POA: Diagnosis not present

## 2018-12-20 LAB — CBC
HCT: 30.9 % — ABNORMAL LOW (ref 36.0–46.0)
Hemoglobin: 11 g/dL — ABNORMAL LOW (ref 12.0–15.0)
MCH: 32.4 pg (ref 26.0–34.0)
MCHC: 35.6 g/dL (ref 30.0–36.0)
MCV: 91.2 fL (ref 80.0–100.0)
Platelets: 55 10*3/uL — ABNORMAL LOW (ref 150–400)
RBC: 3.39 MIL/uL — ABNORMAL LOW (ref 3.87–5.11)
RDW: 16 % — ABNORMAL HIGH (ref 11.5–15.5)
WBC: 4.6 10*3/uL (ref 4.0–10.5)
nRBC: 0 % (ref 0.0–0.2)

## 2018-12-20 LAB — PROTIME-INR
INR: 1.2 (ref 0.8–1.2)
Prothrombin Time: 15.4 seconds — ABNORMAL HIGH (ref 11.4–15.2)

## 2018-12-20 MED ORDER — ADULT MULTIVITAMIN W/MINERALS CH: 1.0000 | ORAL_TABLET | Freq: Every day | ORAL | Status: DC

## 2018-12-20 MED ORDER — FENTANYL CITRATE (PF) 100 MCG/2ML IJ SOLN
INTRAMUSCULAR | Status: AC | PRN
  Administered 2018-12-20: 50 ug via INTRAVENOUS

## 2018-12-20 MED ORDER — ADULT MULTIVITAMIN W/MINERALS CH: 1.0000 | ORAL_TABLET | Freq: Every day | ORAL | 0 refills | Status: DC

## 2018-12-20 MED ORDER — FENTANYL CITRATE (PF) 100 MCG/2ML IJ SOLN
INTRAMUSCULAR | Status: AC
  Filled 2018-12-20: qty 4

## 2018-12-20 MED ORDER — SODIUM CHLORIDE 0.9 % IV SOLN
INTRAVENOUS | Status: DC
  Administered 2018-12-20: 10:00:00 via INTRAVENOUS

## 2018-12-20 MED ORDER — SODIUM CHLORIDE 0.9 % IV SOLN
INTRAVENOUS | Status: AC | PRN
  Administered 2018-12-20: 10 mL/h via INTRAVENOUS

## 2018-12-20 MED ORDER — ENSURE ENLIVE PO LIQD: 237.0000 mL | Freq: Three times a day (TID) | ORAL | Status: DC

## 2018-12-20 MED ORDER — MIDAZOLAM HCL 2 MG/2ML IJ SOLN
INTRAMUSCULAR | Status: AC | PRN
  Administered 2018-12-20: 1 mg via INTRAVENOUS

## 2018-12-20 MED ORDER — MIDAZOLAM HCL 2 MG/2ML IJ SOLN
INTRAMUSCULAR | Status: AC
  Filled 2018-12-20: qty 4

## 2018-12-20 MED ORDER — TRAMADOL HCL 50 MG PO TABS: 50.0000 mg | ORAL_TABLET | Freq: Two times a day (BID) | ORAL | 0 refills | Status: DC | PRN

## 2018-12-20 MED ORDER — TRAMADOL HCL 50 MG PO TABS
50.0000 mg | ORAL_TABLET | Freq: Two times a day (BID) | ORAL | Status: DC | PRN
  Administered 2018-12-20: 50 mg via ORAL
  Filled 2018-12-20: qty 1

## 2018-12-20 MED ORDER — HEPARIN SOD (PORK) LOCK FLUSH 100 UNIT/ML IV SOLN
500.0000 [IU] | Freq: Once | INTRAVENOUS | Status: AC
  Administered 2018-12-20: 500 [IU] via INTRAVENOUS
  Filled 2018-12-20 (×2): qty 5

## 2018-12-20 MED ORDER — MORPHINE SULFATE (PF) 2 MG/ML IV SOLN
1.0000 mg | Freq: Once | INTRAVENOUS | Status: AC
  Administered 2018-12-20: 1 mg via INTRAVENOUS
  Filled 2018-12-20: qty 1

## 2018-12-20 MED ORDER — LORAZEPAM 2 MG/ML IJ SOLN
1.0000 mg | Freq: Once | INTRAMUSCULAR | Status: AC
  Administered 2018-12-20: 12:00:00 1 mg via INTRAVENOUS
  Filled 2018-12-20: qty 1

## 2018-12-20 MED ORDER — SODIUM CHLORIDE 0.9% IV SOLUTION
Freq: Once | INTRAVENOUS | Status: AC
  Administered 2018-12-20: 10:00:00 via INTRAVENOUS

## 2018-12-20 NOTE — Discharge Summary (Addendum)
[name] [place] NAME: Shelby [name]    MR#:  [id number]  DATE OF BIRTH:  16-Apr-1970  DATE OF ADMISSION:  12/18/2018 ADMITTING PHYSICIAN: Rise Patience, MD  DATE OF DISCHARGE: 12/20/2018  PRIMARY CARE PHYSICIAN: Jerrol Banana., MD    ADMISSION DIAGNOSIS:  Liver mass [R16.0] Abdominal pain [R10.9] Cirrhosis of liver with ascites, unspecified hepatic cirrhosis type ([place]) [K74.60, R18.8] Sepsis, due to unspecified organism, unspecified whether acute organ dysfunction present ([place]) [A41.9]  DISCHARGE DIAGNOSIS:  *SIRS--resolved *Diffuse Heterogenous changes of the liver withintrahepatic portal vein thrombosis worrisome for liver metastasis-- biopsy done today results pending *Breast Cancer undergoing Chemotherapy *Pancytopenia suspect chemo induced SECONDARY DIAGNOSIS:   Past Medical History:  Diagnosis Date  . Anemia    h/o with pregnancy  . Anxiety   . Asthma    allergy induced-no inhaler  . Cancer [facility])    breast left   . Complication of anesthesia   . Environmental allergies   . Family history of adverse reaction to anesthesia    son-breathing problems-coded 1st time when he was 2 and had another surgery at 53 and had to be admitted for breathing problems  . Family history of breast cancer   . GERD (gastroesophageal reflux disease)   . Headache    migraines  . Hypertension   . Mitral valve disease   . Mitral valve prolapse   . Painful menstrual periods   . PONV (postoperative nausea and vomiting)   . Vertigo     HOSPITAL COURSE:  Shelby [name] a 48 y.o.femalewithhistory of stage IIa triple negative invasive carcinoma of the left breast status post neoadjuvant chemotherapy and resection and June 2020 presently on Xeloda presents to the ER because of ongoing epigastric pain over the last 3 to 4 days and also had subjective feeling of fever chills and has recorded a fever 101 F at home. Denies  any nausea vomiting had 1 episode of diarrhea after taking MOM  1. SIRS on presentation with abdominal epigastric pain -no exact source of infection identified. -Some inflammatory changes in splenic fracture and dissenting: -recieved IV cefepime--no indication for further abxs at present. Pt informed -CT scan showing possible ground glass opacity in the lungs ? Mets  less likely infection -COVID-19 negative -advanced to full liquid  2. Epigastric pain with elevated LFTs and possible diffuse heterogeneous changes in the liver worrisome for  Metastasis -MRI abdomen and CT abdomen shows intrahepatic poor vein thrombosis. -Ultrasound of the abdomen shows patent main portal vein -seen by Dr. Jennette Banker G.I. -s/p biopsy of the liver by Dr. Kathlene Cote on 12/20/2018 . Agreeable by Dr. Grayland Ormond-- follow-up outpatient with Dr. Grayland Ormond on Monday. Appointment given to patient  3. Pancytopenia/thrombocytopenia suspected due to ongoing chemotherapy -follow CBC -status post one unit of platelet transfusion prior to biopsy today. -Platelet count 50 5K  4. Breast cancer undergoing chemotherapy -Dr. Grayland Ormond to follow-up on Monday  Discharge patient home later today she is agreeable.  Family communication : pt has updated her family  consults : IR, G.I., oncology Discharge Disposition : home CODE STATUS: full DVT Prophylaxis : SCD-- thrombocytopenia   CONSULTS OBTAINED:  Treatment Team:  Lloyd Huger, MD  DRUG ALLERGIES:   Allergies  Allergen Reactions  . Hydrocodone Hives    Swollen face - many years ago. Thinks she has tolerated since.  . Iodine Hives  . Peanut Butter Flavor Other (See Comments)    Made mouth tingle  .  Shellfish Allergy Hives  . Vancomycin Other (See Comments)    Itching and forehead turned red    DISCHARGE MEDICATIONS:   Allergies as of 12/20/2018      Reactions   Hydrocodone Hives   Swollen face - many years ago. Thinks she has tolerated since.    Iodine Hives   Peanut Butter Flavor Other (See Comments)   Made mouth tingle   Shellfish Allergy Hives   Vancomycin Other (See Comments)   Itching and forehead turned red      Medication List    STOP taking these medications   EPINEPHrine 0.3 mg/0.3 mL Soaj injection Commonly known as: EPI-PEN     TAKE these medications   ALPRAZolam 0.5 MG tablet Commonly known as: XANAX Take 0.5-1 tablets (0.25-0.5 mg total) by mouth 2 (two) times daily as needed for anxiety.   capecitabine 500 MG tablet Commonly known as: XELODA Take 4 tablets (2,000 mg total) by mouth 2 (two) times daily after a meal. Take for 14 days, then hold for 7 days. Repeat every 21 days.   famotidine 20 MG tablet Commonly known as: PEPCID Take 20 mg by mouth daily as needed for heartburn.   ibuprofen 200 MG tablet Commonly known as: ADVIL Take 200 mg by mouth daily as needed for mild pain.   losartan 50 MG tablet Commonly known as: COZAAR TAKE 1 TABLET BY MOUTH DAILY   mometasone 50 MCG/ACT nasal spray Commonly known as: Nasonex Place 2 sprays into the nose daily. What changed:   when to take this  reasons to take this   montelukast 10 MG tablet Commonly known as: SINGULAIR TAKE 1 TABLET BY MOUTH AT BEDTIME What changed:   when to take this  reasons to take this   multivitamin with minerals Tabs tablet Take 1 tablet by mouth daily. Start taking on: December 21, 2018   Omega-3 1000 MG Caps Take 1,000 mg by mouth every evening.   omeprazole 20 MG capsule Commonly known as: PRILOSEC Take 1 capsule (20 mg total) by mouth daily.   sucralfate 1 g tablet Commonly known as: CARAFATE Take 1 tablet (1 g total) by mouth 4 (four) times daily -  with meals and at bedtime.   traMADol 50 MG tablet Commonly known as: ULTRAM Take 1 tablet (50 mg total) by mouth every 12 (twelve) hours as needed for severe pain.   Vitamin D 50 MCG (2000 UT) Caps Take 2,000 Units by mouth every evening.       If  you experience worsening of your admission symptoms, develop shortness of breath, life threatening emergency, suicidal or homicidal thoughts you must seek medical attention immediately by calling 911 or calling your MD immediately  if symptoms less severe.  You Must read complete instructions/literature along with all the possible adverse reactions/side effects for all the Medicines you take and that have been prescribed to you. Take any new Medicines after you have completely understood and accept all the possible adverse reactions/side effects.   Please note  You were cared for by a hospitalist during your hospital stay. If you have any questions about your discharge medications or the care you received while you were in the hospital after you are discharged, you can call the unit and asked to speak with the hospitalist on call if the hospitalist that took care of you is not available. Once you are discharged, your primary care physician will handle any further medical issues. Please note that NO REFILLS for any  discharge medications will be authorized once you are discharged, as it is imperative that you return to your primary care physician (or establish a relationship with a primary care physician if you do not have one) for your aftercare needs so that they can reassess your need for medications and monitor your lab values. Today   SUBJECTIVE   Denies any abdominal pain. She just got back from liver biopsy. VITAL SIGNS:  Blood pressure 131/87, pulse 86, temperature 98.4 F (36.9 C), temperature source Oral, resp. rate 17, height 5\' 6"  (1.676 m), weight 65.6 kg, SpO2 95 %.  I/O:    Intake/Output Summary (Last 24 hours) at 12/20/2018 1714 Last data filed at 12/20/2018 1135 Gross per 24 hour  Intake 300 ml  Output -  Net 300 ml    PHYSICAL EXAMINATION:  GENERAL:  48 y.o.-year-old patient lying in the bed with no acute distress.  EYES: Pupils equal, round, reactive to light and  accommodation. No scleral icterus. Extraocular muscles intact.  HEENT: Head atraumatic, normocephalic. Oropharynx and nasopharynx clear.  NECK:  Supple, no jugular venous distention. No thyroid enlargement, no tenderness.  LUNGS: Normal breath sounds bilaterally, no wheezing, rales,rhonchi or crepitation. No use of accessory muscles of respiration.  CARDIOVASCULAR: S1, S2 normal. No murmurs, rubs, or gallops.  ABDOMEN: Soft, non-tender, non-distended. Bowel sounds present. No organomegaly or mass.  EXTREMITIES: No pedal edema, cyanosis, or clubbing.  NEUROLOGIC: Cranial nerves II through XII are intact. Muscle strength 5/5 in all extremities. Sensation intact. Gait not checked.  PSYCHIATRIC: The patient is alert and oriented x 3.  SKIN: No obvious rash, lesion, or ulcer.   DATA REVIEW:   CBC  Recent Labs  Lab 12/20/18 0457  WBC 4.6  HGB 11.0*  HCT 30.9*  PLT 55*    Chemistries  Recent Labs  Lab 12/19/18 0557  NA 135  K 4.3  CL 97*  CO2 24  GLUCOSE 153*  BUN 11  CREATININE 0.63  CALCIUM 9.0  AST 173*  ALT 115*  ALKPHOS 104  BILITOT 1.2    Microbiology Results   Recent Results (from the past 240 hour(s))  SARS CORONAVIRUS 2 (TAT 6-24 HRS) Nasopharyngeal Nasopharyngeal Swab     Status: None   Collection Time: 12/18/18  7:20 PM   Specimen: Nasopharyngeal Swab  Result Value Ref Range Status   SARS Coronavirus 2 NEGATIVE NEGATIVE Final    Comment: (NOTE) SARS-CoV-2 target nucleic acids are NOT DETECTED. The SARS-CoV-2 RNA is generally detectable in upper and lower respiratory specimens during the acute phase of infection. Negative results do not preclude SARS-CoV-2 infection, do not rule out co-infections with other pathogens, and should not be used as the sole basis for treatment or other patient management decisions. Negative results must be combined with clinical observations, patient history, and epidemiological information. The expected result is  Negative. Fact Sheet for Patients: SugarRoll.be Fact Sheet for Healthcare Providers: https://www.woods-mathews.com/ This test is not yet approved or cleared by the [place] FDA and  has been authorized for detection and/or diagnosis of SARS-CoV-2 by FDA under an Emergency Use Authorization (EUA). This EUA will remain  in effect (meaning this test can be used) for the duration of the COVID-19 declaration under Section 56 4(b)(1) of the Act, 21 U.S.C. section 360bbb-3(b)(1), unless the authorization is terminated or revoked sooner. Performed at [facility], [place] [address], [place], [place] [address]   Blood culture (routine x 2)     Status: None (Preliminary result)  Collection Time: 12/18/18  7:57 PM   Specimen: BLOOD  Result Value Ref Range Status   Specimen Description BLOOD PORTA CATH  Final   Special Requests   Final    BOTTLES DRAWN AEROBIC AND ANAEROBIC Blood Culture adequate volume   Culture   Final    NO GROWTH 2 DAYS Performed at [facility], [address] [place], [place] [address]    Report Status PENDING  Incomplete  Blood culture (routine x 2)     Status: None (Preliminary result)   Collection Time: 12/18/18  7:57 PM   Specimen: BLOOD  Result Value Ref Range Status   Specimen Description BLOOD PORTA CATH  Final   Special Requests   Final    BOTTLES DRAWN AEROBIC AND ANAEROBIC Blood Culture adequate volume   Culture   Final    NO GROWTH 2 DAYS Performed at [facility], [address], [place], [place] [address]    Report Status PENDING  Incomplete    RADIOLOGY:  Ct Abdomen Pelvis Wo Contrast  Addendum Date: 12/18/2018   ADDENDUM REPORT: 12/18/2018 20:18 ADDENDUM: These results were called by telephone on 12/18/2018 at 8:18 pm to provider [facility] , who verbally acknowledged these results. Electronically Signed   By: Lovena Le M.D.   On: 12/18/2018 20:18   Result Date:  12/18/2018 CLINICAL DATA:  Abdominal pain and distension, undergoing chemotherapy for breast cancer, history of IBS and diverticulitis EXAM: CT ABDOMEN AND PELVIS WITHOUT CONTRAST TECHNIQUE: Multidetector CT imaging of the abdomen and pelvis was performed following the standard protocol without IV contrast. COMPARISON:  CT abdomen pelvis 01/01/2018 FINDINGS: Lower chest: The patchy areas of subpleural ground-glass opacity could reflect atelectasis or early infection. Cardiac size is normal. Trace pericardial effusion, similar to prior. Hepatobiliary: Ill-defined regions of hypoattenuation are seen the anterior left lobe liver. Slightly nodular hepatic surface contour which is new from comparison exam. These features are both incompletely assessed in the absence of contrast media. Gallbladder appears largely contracted at the time of exam. No biliary ductal dilatation. Pancreas: Unremarkable. No pancreatic ductal dilatation or surrounding inflammatory changes. Spleen: Normal in size without focal abnormality. Adrenals/Urinary Tract: No suspicious adrenal lesions. No visible or contour deforming renal lesions. No urolithiasis or hydronephrosis mild circumferential bladder wall thickening. Stomach/Bowel: Distal esophagus, stomach and duodenal sweep are unremarkable. No small bowel wall thickening or dilatation. No evidence of obstruction. Appendix is not clearly identified. No pericecal inflammation. There is some mild mural thickening of the splenic flexure and descending colon remaining portions of the colon are free of mural thickening or dilatation. Vascular/Lymphatic: Upper abdominal venous collateralization and splenorenal shunting is noted. No suspicious or enlarged lymph nodes in the included lymphatic chains. Reproductive: Uterus is surgically absent. No concerning adnexal lesions. Other: Small volume of low-attenuation ascites noted in the low abdomen. Small volume of perihepatic low-attenuation ascites is  present as well. Musculoskeletal: Multilevel degenerative changes are present in the imaged portions of the spine. No acute osseous abnormality or suspicious osseous lesion. IMPRESSION: 1. Interval development of cirrhotic stigmata including a nodular liver surface contour and increasing upper abdominal venous collateralization. Small volume of ascites is noted as well. 2. Ill-defined regions of hypoattenuation are seen within the anterior left lobe liver, which are incompletely assessed in the absence of contrast media. Furthermore findings are worrisome in the setting of known malignancy and potential intrinsic liver disease. Consider further evaluation with MRI or ultrasound if patient is unable to tolerate CT contrast due  to allergy. 3. Mild mural thickening of the splenic flexure and descending colon, could reflect change related to the additional cirrhotic findings/portal colopathy though should exclude a an acute colitis clinically. 4. Patchy areas of subpleural ground-glass opacity in the visualized lung bases could reflect atelectasis or early infection. 5. Mild circumferential bladder wall thickening, which may be related to underdistention. Correlate with urinalysis to exclude cystitis. 6. Unchanged trace pericardial effusion. Electronically Signed: By: Lovena Le M.D. On: 12/18/2018 20:07   Ct Angio Chest Pe W And/or Wo Contrast  Result Date: 12/19/2018 CLINICAL DATA:  Chest pain, complex, intermediate/high prob of ACS/PE/AAS; Abd distension Epigastric pain. Exertional dyspnea. Cough. Weakness. History of breast cancer. EXAM: CT ANGIOGRAPHY CHEST CT ABDOMEN AND PELVIS WITH CONTRAST TECHNIQUE: Multidetector CT imaging of the chest was performed using the standard protocol during bolus administration of intravenous contrast. Multiplanar CT image reconstructions and MIPs were obtained to evaluate the vascular anatomy. Multidetector CT imaging of the abdomen and pelvis was performed using the standard  protocol during bolus administration of intravenous contrast. No reported contrast reaction. CONTRAST:  158mL OMNIPAQUE IOHEXOL 350 MG/ML SOLN COMPARISON:  Abdominal CT without contrast yesterday. Chest CT 01/01/2018 FINDINGS: CTA CHEST FINDINGS Cardiovascular: There are no filling defects within the pulmonary arteries to suggest pulmonary embolus. Thoracic aorta is normal in caliber without dissection. Left vertebral artery arises directly from the thoracic aorta, variant arch anatomy. Heart is normal in size. No pericardial effusion. Right chest port in place with tip in the SVC. Mediastinum/Nodes: No mediastinal or hilar adenopathy. No visualized thyroid nodule. No esophageal wall thickening. Lungs/Pleura: Mild patchy areas of subpleural nodular and ground-glass opacities. Basilar findings are unchanged from CT yesterday. 5 mm right upper lobe pulmonary nodule series 6, image 41. Sub solid nodularity slightly more inferiorly in the right lung series 6, image 45 measuring 8 mm. Tiny 2 mm nodule in the right upper lung series 6, image 32, may be fissural. Pulmonary nodules are new from prior chest CT. Perifissural in subpleural opacity in the right upper lobe, also series 6, image 32, nonspecific. No pulmonary edema. No pleural fluid. Trachea and mainstem bronchi are patent. Musculoskeletal: There are no acute or suspicious osseous abnormalities. Review of the MIP images confirms the above findings. CT ABDOMEN and PELVIS FINDINGS Hepatobiliary: Hepatic parenchyma diffusely heterogeneous with nodular contours. Liver is increased in size from December 2019 CT. Gallbladder is decompressed and not well assessed, intraluminal high density may be stones or sludge. Questionable wall thickening may be related to nondistention. Pancreas: No ductal dilatation or inflammation. Spleen: Normal in size without focal abnormality. Adrenals/Urinary Tract: Normal adrenal glands. No hydronephrosis or perinephric edema. Homogeneous  renal enhancement with symmetric excretion on delayed phase imaging. Urinary bladder is partially distended, mild bladder wall thickening. Stomach/Bowel: Lack of enteric contrast and paucity of intra-abdominal fat limits detailed bowel assessment. The stomach is nondistended. Questionable pre-pyloric gastric wall thickening. No small bowel obstruction or inflammatory change. Appendix not discretely visualized, no pericecal inflammation to suggest appendicitis. Transverse colon is tortuous. Mild colonic wall thickening at the splenic flexure, colon is decompressed, this is not well assessed. Questionable areas of descending colonic wall thickening, for example series 11, image 61. Vascular/Lymphatic: Right portal vein is patent. The left portal vein is attenuated without evidence of thrombus. Tortuous splenic vein with prominent collaterals in the left upper quadrant and left retroperitoneum. 9 mm perigastric lymph node series 11, image 29, nonspecific. No pelvic adenopathy. Reproductive: Uterus surgically absent. Left ovary tentatively visualized and  normal. Right ovary not definitively seen. No obvious adnexal mass. Other: Small volume of pelvic ascites measuring simple fluid density, similar to yesterday. Small perihepatic ascites anteriorly. No free air. Musculoskeletal: There are no acute or suspicious osseous abnormalities. Scattered bone islands in the pelvis. Review of the MIP images confirms the above findings. IMPRESSION: CT chest: 1. No pulmonary embolus. 2. Mild patchy areas of subpleural nodular and ground-glass opacities in both lungs. Slightly more confluent subpleural/perifissural right upper lobe opacity. Findings may be due to atelectasis or infection. 3. Small pulmonary nodules are new from December, infectious/inflammatory or metastatic. CT abdomen/pelvis: 1. Diffusely heterogeneous liver with nodular contours. Liver has increased in size since December 2019 CT. This may be due to underlying  cirrhotic change versus diffuse metastatic disease. Recommend further evaluation with abdominal MRI. Left upper quadrant and retroperitoneal collaterals can be seen with portal hypertension. Left portal vein is attenuated, however no discrete thrombus. 2. Questionable pre-pyloric gastric wall thickening, peptic ulcer disease versus gastritis. 3. Bladder wall thickening, small volume abdominopelvic ascites, and areas of colonic wall thickening at the splenic flexure and descending colon are unchanged from CT yesterday. 4. Gallbladder is decompressed and not well assessed, intraluminal high density may be stones or sludge. Questionable gallbladder wall thickening may be related to nondistention or liver disease. This could be further evaluated with MRI or right upper quadrant ultrasound. Electronically Signed   By: Keith Rake M.D.   On: 12/19/2018 05:33   Mr Abdomen W Wo Contrast  Result Date: 12/19/2018 CLINICAL DATA:  Abdominal pain in a patient with breast cancer. EXAM: MRI ABDOMEN WITHOUT AND WITH CONTRAST TECHNIQUE: Multiplanar multisequence MR imaging of the abdomen was performed both before and after the administration of intravenous contrast. CONTRAST:  58mL GADAVIST GADOBUTROL 1 MMOL/ML IV SOLN COMPARISON:  CT of 12/19/2018 FINDINGS: Lower chest: Limited assessment of the lower chest on MRI is unremarkable. Hepatobiliary: Diffuse infiltration of much of the liver, nearly the entire left hepatic lobe, medial section and lateral section in addition to anterior right hepatic lobe with confluent infiltrative restricted diffusion and heterogeneous enhancement measuring approximately 21 by 7.4 cm. Diffuse metastatic disease is strongly suspected. Boundaries are well-demarcated without adjacent edema in the bordering hepatic parenchyma Discrete hepatic lesions are also scattered about the right hepatic lobe largest in the dome of the right hemi liver measuring approximately 2.5 cm in greatest dimension.  These discrete lesions display a targetoid appearance. There are numerous lesions in the right hepatic lobe, lesions are found in all hepatic subsegment. Pancreas: No mass, inflammatory changes, or other parenchymal abnormality identified. Mild ductal distension in the tail of the pancreas may be due to adjacent mass effect. No visible pancreatic lesion is noted. Spleen:  Within normal limits in size and appearance. Adrenals/Urinary Tract: No masses identified. No evidence of hydronephrosis. Stomach/Bowel: Limited assessment of the gastrointestinal tract without signs of acute process. Vascular/Lymphatic: Left-sided peri-renal venous collaterals of uncertain significance perhaps related to elevated portal pressures in the setting of diffuse hepatic involvement. 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 Right portal vein is patent. Scattered small lymph nodes in the upper abdomen. There is either slow flow or thrombus within the main portal vein at the level of the splenic portal confluence extending in the splenic vein. The SMV is patent. There is mass effect upon the vessel at the level of the splenic portal confluence due to hepatic enlargement. This is best seen on subtraction images, image 54, series 20. Note that slow flow in an  otherwise opacified vessel can cause artifact with a similar appearance however, this is seen on contrasted and non contrasted imaging with a similar appearance. Other:  None. Musculoskeletal: No signs of acute or destructive bone process. IMPRESSION: 1. Diffuse infiltration of much of the liver, nearly the entire left hepatic lobe, with confluent infiltrative restricted diffusion and heterogeneous enhancement measuring approximately 21 by 7.4 cm. Diffuse metastatic disease is strongly suspected with multifocal lesions in the right hepatic lobe. Superimposed infection is difficult to exclude though areas at the boundary of these lesions show no overt signs of edema. 2. Highly narrowed or  occluded left portal vein with potential thrombus versus slow flow in the splenoportal confluence. This was patent on the exam of 12/19/2018; however, given above findings a venous phase CT or even ultrasound hepatic Doppler with special attention to the splenic portal confluence may be helpful to exclude this possibility, finding is also exhibited on TrueFISP sequence (image 24, series 10). 3. 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 4. Mild ductal distension in the tail of the pancreas, potentially due to mass effect. Attention on follow-up. 5. Critical Value/emergent results were called by telephone at the time of interpretation on 12/19/2018 at 6:43 pm to [place] , who verbally acknowledged these results. Electronically Signed   By: Zetta Bills M.D.   On: 12/19/2018 18:24   US Abdomen Complete  Result Date: 12/19/2018 CLINICAL DATA:  Abdominal pain EXAM: ABDOMEN ULTRASOUND COMPLETE COMPARISON:  MRI earlier today FINDINGS: Gallbladder: Gallbladder is contracted with associated mild wall thickening measuring up to 4 mm. Sludge noted within the gallbladder. No visible stones. Common bile duct: Diameter: Normal caliber, 3 mm Liver: Diffusely heterogeneous echotexture throughout the liver corresponding to the abnormality seen on today's MRI suspicious forinfiltrating mass. Main portal vein is patent on color Doppler imaging with normal direction of blood flow towards the liver. The area of possible thrombus seen on MRI at the level of the splenic portal confluence not visualized. IVC: No abnormality visualized. Pancreas: Visualized portion unremarkable. Spleen: Size and appearance within normal limits. Right Kidney: Length: 11.0 cm. Echogenicity within normal limits. No mass or hydronephrosis visualized. Left Kidney: Length: 10.5 cm. Echogenicity within normal limits. No mass or hydronephrosis  visualized. Abdominal aorta: No aneurysm visualized. Other findings: None. IMPRESSION: Heterogeneous appearance throughout much of the left hepatic lobe as seen on today's MRI concerning for infiltrating mass/tumor. Main portal vein is patent. Contracted gallbladder with gallbladder wall thickening likely related to contracted state. Sludge within the gallbladder. No sonographic Murphy sign or visible stones. Electronically Signed   By: Rolm Baptise M.D.   On: 12/19/2018 20:35   Ct Abdomen Pelvis W Contrast  Result Date: 12/19/2018 CLINICAL DATA:  Chest pain, complex, intermediate/high prob of ACS/PE/AAS; Abd distension Epigastric pain. Exertional dyspnea. Cough. Weakness. History of breast cancer. EXAM: CT ANGIOGRAPHY CHEST CT ABDOMEN AND PELVIS WITH CONTRAST TECHNIQUE: Multidetector CT imaging of the chest was performed using the standard protocol during bolus administration of intravenous contrast. Multiplanar CT image reconstructions and MIPs were obtained to evaluate the vascular anatomy. Multidetector CT imaging of the abdomen and pelvis was performed using the standard protocol during bolus administration of intravenous contrast. No reported contrast reaction. CONTRAST:  116mL OMNIPAQUE IOHEXOL 350 MG/ML SOLN COMPARISON:  Abdominal CT without contrast yesterday. Chest CT 01/01/2018 FINDINGS: CTA CHEST FINDINGS Cardiovascular: There are no filling defects within the pulmonary arteries to suggest pulmonary embolus.  Thoracic aorta is normal in caliber without dissection. Left vertebral artery arises directly from the thoracic aorta, variant arch anatomy. Heart is normal in size. No pericardial effusion. Right chest port in place with tip in the SVC. Mediastinum/Nodes: No mediastinal or hilar adenopathy. No visualized thyroid nodule. No esophageal wall thickening. Lungs/Pleura: Mild patchy areas of subpleural nodular and ground-glass opacities. Basilar findings are unchanged from CT yesterday. 5 mm right  upper lobe pulmonary nodule series 6, image 41. Sub solid nodularity slightly more inferiorly in the right lung series 6, image 45 measuring 8 mm. Tiny 2 mm nodule in the right upper lung series 6, image 32, may be fissural. Pulmonary nodules are new from prior chest CT. Perifissural in subpleural opacity in the right upper lobe, also series 6, image 32, nonspecific. No pulmonary edema. No pleural fluid. Trachea and mainstem bronchi are patent. Musculoskeletal: There are no acute or suspicious osseous abnormalities. Review of the MIP images confirms the above findings. CT ABDOMEN and PELVIS FINDINGS Hepatobiliary: Hepatic parenchyma diffusely heterogeneous with nodular contours. Liver is increased in size from December 2019 CT. Gallbladder is decompressed and not well assessed, intraluminal high density may be stones or sludge. Questionable wall thickening may be related to nondistention. Pancreas: No ductal dilatation or inflammation. Spleen: Normal in size without focal abnormality. Adrenals/Urinary Tract: Normal adrenal glands. No hydronephrosis or perinephric edema. Homogeneous renal enhancement with symmetric excretion on delayed phase imaging. Urinary bladder is partially distended, mild bladder wall thickening. Stomach/Bowel: Lack of enteric contrast and paucity of intra-abdominal fat limits detailed bowel assessment. The stomach is nondistended. Questionable pre-pyloric gastric wall thickening. No small bowel obstruction or inflammatory change. Appendix not discretely visualized, no pericecal inflammation to suggest appendicitis. Transverse colon is tortuous. Mild colonic wall thickening at the splenic flexure, colon is decompressed, this is not well assessed. Questionable areas of descending colonic wall thickening, for example series 11, image 61. Vascular/Lymphatic: Right portal vein is patent. The left portal vein is attenuated without evidence of thrombus. Tortuous splenic vein with prominent  collaterals in the left upper quadrant and left retroperitoneum. 9 mm perigastric lymph node series 11, image 29, nonspecific. No pelvic adenopathy. Reproductive: Uterus surgically absent. Left ovary tentatively visualized and normal. Right ovary not definitively seen. No obvious adnexal mass. Other: Small volume of pelvic ascites measuring simple fluid density, similar to yesterday. Small perihepatic ascites anteriorly. No free air. Musculoskeletal: There are no acute or suspicious osseous abnormalities. Scattered bone islands in the pelvis. Review of the MIP images confirms the above findings. IMPRESSION: CT chest: 1. No pulmonary embolus. 2. Mild patchy areas of subpleural nodular and ground-glass opacities in both lungs. Slightly more confluent subpleural/perifissural right upper lobe opacity. Findings may be due to atelectasis or infection. 3. Small pulmonary nodules are new from December, infectious/inflammatory or metastatic. CT abdomen/pelvis: 1. Diffusely heterogeneous liver with nodular contours. Liver has increased in size since December 2019 CT. This may be due to underlying cirrhotic change versus diffuse metastatic disease. Recommend further evaluation with abdominal MRI. Left upper quadrant and retroperitoneal collaterals can be seen with portal hypertension. Left portal vein is attenuated, however no discrete thrombus. 2. Questionable pre-pyloric gastric wall thickening, peptic ulcer disease versus gastritis. 3. Bladder wall thickening, small volume abdominopelvic ascites, and areas of colonic wall thickening at the splenic flexure and descending colon are unchanged from CT yesterday. 4. Gallbladder is decompressed and not well assessed, intraluminal high density may be stones or sludge. Questionable gallbladder wall thickening may be related to nondistention  or liver disease. This could be further evaluated with MRI or right upper quadrant ultrasound. Electronically Signed   By: Keith Rake  M.D.   On: 12/19/2018 05:33   US Biopsy (liver)  Result Date: 12/20/2018 INDICATION: History of left breast carcinoma with imaging evidence of probable diffuse metastatic disease in the liver, more prominently in the left lobe. The patient presents for biopsy. EXAM: ULTRASOUND GUIDED CORE BIOPSY OF LIVER MEDICATIONS: None. ANESTHESIA/SEDATION: Fentanyl 100 mcg IV; Versed 2.0 mg IV Moderate Sedation Time:  20 minutes. The patient was continuously monitored during the procedure by the interventional radiology nurse under my direct supervision. PROCEDURE: The procedure, risks, benefits, and alternatives were explained to the patient. Questions regarding the procedure were encouraged and answered. The patient understands and consents to the procedure. A time-out was performed prior to initiating the procedure. Ultrasound was used to localize lesions within the liver. The anterior abdominal wall was prepped with chlorhexidine in a sterile fashion, and a sterile drape was applied covering the operative field. A sterile gown and sterile gloves were used for the procedure. Local anesthesia was provided with 1% Lidocaine. Under direct ultrasound guidance, a 17 gauge trocar needle was advanced into the left lobe of the liver. After confirming needle tip position, coaxial 18 gauge core biopsy samples were obtained. A total of 3 samples were obtained and submitted in formalin. A slurry of Gel-Foam pledgets was then slowly injected via the outer needle as the needle was retracted. Additional ultrasound was performed. COMPLICATIONS: None immediate. FINDINGS: Ultrasound demonstrates heterogeneous appearance of the liver with ill-defined lesions throughout the left lobe. Solid core biopsy samples were obtained by sampling lesions within the lateral segment of the left lobe. IMPRESSION: Ultrasound-guided core biopsy performed of hepatic lesions within the left lobe. Electronically Signed   By: Aletta Edouard M.D.   On:  12/20/2018 13:44   Dg Chest Portable 1 View  Result Date: 12/18/2018 CLINICAL DATA:  Fever abdominal pain EXAM: PORTABLE CHEST 1 VIEW COMPARISON:  12/31/2017 FINDINGS: Right-sided central venous port with tip over the cavoatrial region. No focal airspace disease or effusion. Normal heart size. No pneumothorax. IMPRESSION: No active disease. Electronically Signed   By: Donavan Foil M.D.   On: 12/18/2018 19:49     CODE STATUS:     Code Status Orders  (From admission, onward)         Start     Ordered   12/18/18 2314  Full code  Continuous     12/18/18 2314        Code Status History    This patient has a current code status but no historical code status.   Advance Care Planning Activity     Family Discussion: Consults:   TOTAL TIME TAKING CARE OF THIS PATIENT: *40* minutes.    Fritzi Mandes M.D on 12/20/2018 at 5:14 PM  Between 7am to 6pm - Pager - [phone] After 6pm go to [email or web address] - password [email or web address]  [facility]    CC: Primary care physician; Jerrol Banana., MD

## 2018-12-20 NOTE — H&P (Signed)
Chief Complaint: Patient was seen in consultation today for liver biopsy at the request of Dr. Fritzi Mandes  Referring Physician(s): Fritzi Mandes  Patient Status: [place] - In-pt  History of Present Illness: Shelby [name] is a 48 y.o. 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 CT and MRI demonstrated diffuse ill-defined liver lesions suspicious for metastatic disease and small amount of ascites. Now presenting for liver biopsy.   Past Medical History:  Diagnosis Date   Anemia    h/o with pregnancy   Anxiety    Asthma    allergy induced-no inhaler   Cancer ([place])    breast left    Complication of anesthesia    Environmental allergies    Family history of adverse reaction to anesthesia    son-breathing problems-coded 1st time when he was 2 and had another surgery at 60 and had to be admitted for breathing problems   Family history of breast cancer    GERD (gastroesophageal reflux disease)    Headache    migraines   Hypertension    Mitral valve disease    Mitral valve prolapse    Painful menstrual periods    PONV (postoperative nausea and vomiting)    Vertigo     Past Surgical History:  Procedure Laterality Date   ABDOMINAL HYSTERECTOMY  2010   supracervical    AXILLARY LYMPH NODE DISSECTION Left 07/12/2018   Procedure: AXILLARY LYMPH NODE DISSECTION;  Surgeon: Robert Bellow, MD;  Location: [facility] OR[facility];  Service: General;  Laterality: Left;   BREAST BIOPSY Left 11/2017   invasive ductal carcinoma and metastatic LN   BREAST BIOPSY WITH SENTINEL LYMPH NODE BIOPSY AND NEEDLE LOCALIZATION Left 07/12/2018   Procedure: BREAST BIOPSY WIDE EXCISION WITH SENTINEL NODE  AND NEEDLE LOCALIZATION OF [name] LEFT;  Surgeon: Robert Bellow, MD;  Location: [facility] OR[facility];  Service:  General;  Laterality: Left;   BUNIONECTOMY Bilateral    MANDIBLE RECONSTRUCTION     age 44-underbite   PORTACATH PLACEMENT Right 12/31/2017   Procedure: INSERTION PORT-A-CATH;  Surgeon: Robert Bellow, MD;  Location: [facility];  Service: General;  Laterality: Right;   RE-EXCISION OF BREAST LUMPECTOMY Left 07/24/2018   Procedure: RE-EXCISION OF BREAST LUMPECTOMY LEFT;  Surgeon: Robert Bellow, MD;  Location: [facility];  Service: General;  Laterality: Left;   SHOULDER ARTHROSCOPY WITH OPEN ROTATOR CUFF REPAIR Right 04/26/2017   Procedure: SHOULDER ARTHROSCOPY WITH OPEN ROTATOR CUFF REPAIR,SUBACROMINAL DECOMPRESSION;  Surgeon: Thornton Park, MD;  Location: [facility];  Service: Orthopedics;  Laterality: Right;   TONSILLECTOMY      Allergies: Hydrocodone, Iodine, Peanut butter flavor, Shellfish allergy, and Vancomycin  Medications: Prior to Admission medications   Medication Sig Start Date End Date Taking? Authorizing Provider  capecitabine (XELODA) 500 MG tablet Take 4 tablets (2,000 mg total) by mouth 2 (two) times daily after a meal. Take for 14 days, then hold for 7 days. Repeat every 21 days. 10/10/18  Yes Lloyd Huger, MD  Cholecalciferol (VITAMIN D) 50 MCG (2000 UT) CAPS Take 2,000 Units by mouth every evening.    Yes [provider]  famotidine (PEPCID) 20 MG tablet Take 20 mg by mouth daily as needed for heartburn.    Yes [provider]  ibuprofen (ADVIL,MOTRIN) 200 MG tablet Take 200 mg by mouth daily as needed  for mild pain.    Yes [provider]  losartan (COZAAR) 50 MG tablet TAKE 1 TABLET BY MOUTH DAILY Patient taking differently: Take 50 mg by mouth daily.  11/16/18  Yes Jerrol Banana., MD  mometasone (NASONEX) 50 MCG/ACT nasal spray Place 2 sprays into the nose daily. Patient taking differently: Place 2 sprays into the nose daily as needed (allergies).  04/18/18  Yes Finnegan, [name], MD  montelukast (SINGULAIR) 10 MG  tablet TAKE 1 TABLET BY MOUTH AT BEDTIME Patient taking differently: Take 10 mg by mouth daily as needed (allergies).  04/17/18  Yes Jerrol Banana., MD  Omega-3 1000 MG CAPS Take 1,000 mg by mouth every evening.    Yes [provider]  sucralfate (CARAFATE) 1 g tablet Take 1 tablet (1 g total) by mouth 4 (four) times daily -  with meals and at bedtime. 12/17/18  Yes Jerrol Banana., MD  ALPRAZolam Duanne Moron) 0.5 MG tablet Take 0.5-1 tablets (0.25-0.5 mg total) by mouth 2 (two) times daily as needed for anxiety. Patient not taking: Reported on 11/22/2018 06/11/18   Verlon Au, NP  EPINEPHrine 0.3 mg/0.3 mL IJ SOAJ injection Inject 0.3 mLs (0.3 mg total) into the muscle once. Patient not taking: Reported on 11/08/2018 03/29/15   Jerrol Banana., MD  omeprazole (PRILOSEC) 20 MG capsule Take 1 capsule (20 mg total) by mouth daily. 12/17/18   Jerrol Banana., MD  prochlorperazine (COMPAZINE) 10 MG tablet Take 1 tablet (10 mg total) by mouth every 6 (six) hours as needed (Nausea or vomiting). 12/24/17 10/09/18  Lloyd Huger, MD     Family History  Problem Relation Age of Onset   Breast cancer Mother 84   Diabetes Father    Cancer Maternal Uncle        colon   Breast cancer Maternal Grandmother 74    Social History   Socioeconomic History   Marital status: Divorced    Spouse name: Not on file   Number of children: Not on file   Years of education: Not on file   Highest education level: Not on file  Occupational History   Not on file  Social Needs   Financial resource strain: Not on file   Food insecurity    Worry: Not on file    Inability: Not on file   Transportation needs    Medical: Not on file    Non-medical: Not on file  Tobacco Use   Smoking status: Former Smoker    Packs/day: 0.50    Years: 10.00    Pack years: 5.00    Types: Cigarettes    Quit date: 04/20/2007    Years since quitting: 11.6   Smokeless tobacco: Never  Used   Tobacco comment: social smoker back then  Substance and Sexual Activity   Alcohol use: Yes    Comment: occas   Drug use: No   Sexual activity: Yes  Lifestyle   Physical activity    Days per week: Not on file    Minutes per session: Not on file   Stress: Not on file  Relationships   Social connections    Talks on phone: Not on file    Gets together: Not on file    Attends religious service: Not on file    Active member of club or organization: Not on file    Attends meetings of clubs or organizations: Not on file    Relationship status:  Not on file  Other Topics Concern   Not on file  Social History Narrative   Not on file    ECOG Status: 1 - Symptomatic but completely ambulatory  Review of Systems: A 12 point ROS discussed and pertinent positives are indicated in the HPI above.  All other systems are negative.  Review of Systems  Constitutional: Positive for fatigue.  HENT: Negative.   Respiratory: Negative.   Cardiovascular: Negative.   Gastrointestinal: Positive for abdominal pain, diarrhea and nausea.  Genitourinary: Negative.   Musculoskeletal: Negative.   Skin: Negative.   Neurological: Negative.   Hematological: Negative.     Vital Signs: BP 127/80    Pulse (!) 113    Temp 98.4 F (36.9 C) (Oral)    Resp 16    Ht 5\' 6"  (1.676 m)    Wt 65.6 kg    SpO2 95%    BMI 23.34 kg/m   Physical Exam Vitals signs reviewed.  Constitutional:      General: She is not in acute distress.    Appearance: She is ill-appearing. She is not toxic-appearing.  HENT:     Head: Normocephalic and atraumatic.  Cardiovascular:     Rate and Rhythm: Normal rate and regular rhythm.     Heart sounds: Normal heart sounds. No murmur. No friction rub. No gallop.   Pulmonary:     Effort: Pulmonary effort is normal. No respiratory distress.     Breath sounds: Normal breath sounds. No stridor. No wheezing, rhonchi or rales.  Chest:     Chest wall: No tenderness.    Abdominal:     General: Abdomen is flat. There is no distension.     Palpations: Abdomen is soft.     Tenderness: There is abdominal tenderness. There is no guarding or rebound.     Hernia: No hernia is present.     Comments: Mild epigastric tenderness.  Musculoskeletal:        General: No swelling.  Skin:    General: Skin is warm and dry.  Neurological:     General: No focal deficit present.     Mental Status: She is oriented to person, place, and time.     Imaging: Ct Abdomen Pelvis Wo Contrast  Addendum Date: 12/18/2018   ADDENDUM REPORT: 12/18/2018 20:18 ADDENDUM: These results were called by telephone on 12/18/2018 at 8:18 pm to provider [facility] , who verbally acknowledged these results. Electronically Signed   By: Lovena Le M.D.   On: 12/18/2018 20:18   Result Date: 12/18/2018 CLINICAL DATA:  Abdominal pain and distension, undergoing chemotherapy for breast cancer, history of IBS and diverticulitis EXAM: CT ABDOMEN AND PELVIS WITHOUT CONTRAST TECHNIQUE: Multidetector CT imaging of the abdomen and pelvis was performed following the standard protocol without IV contrast. COMPARISON:  CT abdomen pelvis 01/01/2018 FINDINGS: Lower chest: The patchy areas of subpleural ground-glass opacity could reflect atelectasis or early infection. Cardiac size is normal. Trace pericardial effusion, similar to prior. Hepatobiliary: Ill-defined regions of hypoattenuation are seen the anterior left lobe liver. Slightly nodular hepatic surface contour which is new from comparison exam. These features are both incompletely assessed in the absence of contrast media. Gallbladder appears largely contracted at the time of exam. No biliary ductal dilatation. Pancreas: Unremarkable. No pancreatic ductal dilatation or surrounding inflammatory changes. Spleen: Normal in size without focal abnormality. Adrenals/Urinary Tract: No suspicious adrenal lesions. No visible or contour deforming renal lesions. No  urolithiasis or hydronephrosis mild circumferential bladder wall thickening. Stomach/Bowel: Distal  esophagus, stomach and duodenal sweep are unremarkable. No small bowel wall thickening or dilatation. No evidence of obstruction. Appendix is not clearly identified. No pericecal inflammation. There is some mild mural thickening of the splenic flexure and descending colon remaining portions of the colon are free of mural thickening or dilatation. Vascular/Lymphatic: Upper abdominal venous collateralization and splenorenal shunting is noted. No suspicious or enlarged lymph nodes in the included lymphatic chains. Reproductive: Uterus is surgically absent. No concerning adnexal lesions. Other: Small volume of low-attenuation ascites noted in the low abdomen. Small volume of perihepatic low-attenuation ascites is present as well. Musculoskeletal: Multilevel degenerative changes are present in the imaged portions of the spine. No acute osseous abnormality or suspicious osseous lesion. IMPRESSION: 1. Interval development of cirrhotic stigmata including a nodular liver surface contour and increasing upper abdominal venous collateralization. Small volume of ascites is noted as well. 2. Ill-defined regions of hypoattenuation are seen within the anterior left lobe liver, which are incompletely assessed in the absence of contrast media. Furthermore findings are worrisome in the setting of known malignancy and potential intrinsic liver disease. Consider further evaluation with MRI or ultrasound if patient is unable to tolerate CT contrast due to allergy. 3. Mild mural thickening of the splenic flexure and descending colon, could reflect change related to the additional cirrhotic findings/portal colopathy though should exclude a an acute colitis clinically. 4. Patchy areas of subpleural ground-glass opacity in the visualized lung bases could reflect atelectasis or early infection. 5. Mild circumferential bladder wall thickening,  which may be related to underdistention. Correlate with urinalysis to exclude cystitis. 6. Unchanged trace pericardial effusion. Electronically Signed: By: Lovena Le M.D. On: 12/18/2018 20:07   Ct Angio Chest Pe W And/or Wo Contrast  Result Date: 12/19/2018 CLINICAL DATA:  Chest pain, complex, intermediate/high prob of ACS/PE/AAS; Abd distension Epigastric pain. Exertional dyspnea. Cough. Weakness. History of breast cancer. EXAM: CT ANGIOGRAPHY CHEST CT ABDOMEN AND PELVIS WITH CONTRAST TECHNIQUE: Multidetector CT imaging of the chest was performed using the standard protocol during bolus administration of intravenous contrast. Multiplanar CT image reconstructions and MIPs were obtained to evaluate the vascular anatomy. Multidetector CT imaging of the abdomen and pelvis was performed using the standard protocol during bolus administration of intravenous contrast. No reported contrast reaction. CONTRAST:  171mL OMNIPAQUE IOHEXOL 350 MG/ML SOLN COMPARISON:  Abdominal CT without contrast yesterday. Chest CT 01/01/2018 FINDINGS: CTA CHEST FINDINGS Cardiovascular: There are no filling defects within the pulmonary arteries to suggest pulmonary embolus. Thoracic aorta is normal in caliber without dissection. Left vertebral artery arises directly from the thoracic aorta, variant arch anatomy. Heart is normal in size. No pericardial effusion. Right chest port in place with tip in the SVC. Mediastinum/Nodes: No mediastinal or hilar adenopathy. No visualized thyroid nodule. No esophageal wall thickening. Lungs/Pleura: Mild patchy areas of subpleural nodular and ground-glass opacities. Basilar findings are unchanged from CT yesterday. 5 mm right upper lobe pulmonary nodule series 6, image 41. Sub solid nodularity slightly more inferiorly in the right lung series 6, image 45 measuring 8 mm. Tiny 2 mm nodule in the right upper lung series 6, image 32, may be fissural. Pulmonary nodules are new from prior chest CT.  Perifissural in subpleural opacity in the right upper lobe, also series 6, image 32, nonspecific. No pulmonary edema. No pleural fluid. Trachea and mainstem bronchi are patent. Musculoskeletal: There are no acute or suspicious osseous abnormalities. Review of the MIP images confirms the above findings. CT ABDOMEN and PELVIS FINDINGS Hepatobiliary: Hepatic parenchyma  diffusely heterogeneous with nodular contours. Liver is increased in size from December 2019 CT. Gallbladder is decompressed and not well assessed, intraluminal high density may be stones or sludge. Questionable wall thickening may be related to nondistention. Pancreas: No ductal dilatation or inflammation. Spleen: Normal in size without focal abnormality. Adrenals/Urinary Tract: Normal adrenal glands. No hydronephrosis or perinephric edema. Homogeneous renal enhancement with symmetric excretion on delayed phase imaging. Urinary bladder is partially distended, mild bladder wall thickening. Stomach/Bowel: Lack of enteric contrast and paucity of intra-abdominal fat limits detailed bowel assessment. The stomach is nondistended. Questionable pre-pyloric gastric wall thickening. No small bowel obstruction or inflammatory change. Appendix not discretely visualized, no pericecal inflammation to suggest appendicitis. Transverse colon is tortuous. Mild colonic wall thickening at the splenic flexure, colon is decompressed, this is not well assessed. Questionable areas of descending colonic wall thickening, for example series 11, image 61. Vascular/Lymphatic: Right portal vein is patent. The left portal vein is attenuated without evidence of thrombus. Tortuous splenic vein with prominent collaterals in the left upper quadrant and left retroperitoneum. 9 mm perigastric lymph node series 11, image 29, nonspecific. No pelvic adenopathy. Reproductive: Uterus surgically absent. Left ovary tentatively visualized and normal. Right ovary not definitively seen. No obvious  adnexal mass. Other: Small volume of pelvic ascites measuring simple fluid density, similar to yesterday. Small perihepatic ascites anteriorly. No free air. Musculoskeletal: There are no acute or suspicious osseous abnormalities. Scattered bone islands in the pelvis. Review of the MIP images confirms the above findings. IMPRESSION: CT chest: 1. No pulmonary embolus. 2. Mild patchy areas of subpleural nodular and ground-glass opacities in both lungs. Slightly more confluent subpleural/perifissural right upper lobe opacity. Findings may be due to atelectasis or infection. 3. Small pulmonary nodules are new from December, infectious/inflammatory or metastatic. CT abdomen/pelvis: 1. Diffusely heterogeneous liver with nodular contours. Liver has increased in size since December 2019 CT. This may be due to underlying cirrhotic change versus diffuse metastatic disease. Recommend further evaluation with abdominal MRI. Left upper quadrant and retroperitoneal collaterals can be seen with portal hypertension. Left portal vein is attenuated, however no discrete thrombus. 2. Questionable pre-pyloric gastric wall thickening, peptic ulcer disease versus gastritis. 3. Bladder wall thickening, small volume abdominopelvic ascites, and areas of colonic wall thickening at the splenic flexure and descending colon are unchanged from CT yesterday. 4. Gallbladder is decompressed and not well assessed, intraluminal high density may be stones or sludge. Questionable gallbladder wall thickening may be related to nondistention or liver disease. This could be further evaluated with MRI or right upper quadrant ultrasound. Electronically Signed   By: Keith Rake M.D.   On: 12/19/2018 05:33   Mr Abdomen W Wo Contrast  Result Date: 12/19/2018 CLINICAL DATA:  Abdominal pain in a patient with breast cancer. EXAM: MRI ABDOMEN WITHOUT AND WITH CONTRAST TECHNIQUE: Multiplanar multisequence MR imaging of the abdomen was performed both before  and after the administration of intravenous contrast. CONTRAST:  64mL GADAVIST GADOBUTROL 1 MMOL/ML IV SOLN COMPARISON:  CT of 12/19/2018 FINDINGS: Lower chest: Limited assessment of the lower chest on MRI is unremarkable. Hepatobiliary: Diffuse infiltration of much of the liver, nearly the entire left hepatic lobe, medial section and lateral section in addition to anterior right hepatic lobe with confluent infiltrative restricted diffusion and heterogeneous enhancement measuring approximately 21 by 7.4 cm. Diffuse metastatic disease is strongly suspected. Boundaries are well-demarcated without adjacent edema in the bordering hepatic parenchyma Discrete hepatic lesions are also scattered about the right hepatic lobe largest in  the dome of the right hemi liver measuring approximately 2.5 cm in greatest dimension. These discrete lesions display a targetoid appearance. There are numerous lesions in the right hepatic lobe, lesions are found in all hepatic subsegment. Pancreas: No mass, inflammatory changes, or other parenchymal abnormality identified. Mild ductal distension in the tail of the pancreas may be due to adjacent mass effect. No visible pancreatic lesion is noted. Spleen:  Within normal limits in size and appearance. Adrenals/Urinary Tract: No masses identified. No evidence of hydronephrosis. Stomach/Bowel: Limited assessment of the gastrointestinal tract without signs of acute process. Vascular/Lymphatic: Left-sided peri-renal venous collaterals of uncertain significance perhaps related to elevated portal pressures in the setting of diffuse hepatic involvement. 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 Right portal vein is patent. Scattered small lymph nodes in the upper abdomen. There is either slow flow or thrombus within the main portal vein at the level of the splenic portal confluence extending in the splenic vein. The SMV is patent. There is mass effect upon the vessel at the level of the splenic portal  confluence due to hepatic enlargement. This is best seen on subtraction images, image 54, series 20. Note that slow flow in an otherwise opacified vessel can cause artifact with a similar appearance however, this is seen on contrasted and non contrasted imaging with a similar appearance. Other:  None. Musculoskeletal: No signs of acute or destructive bone process. IMPRESSION: 1. Diffuse infiltration of much of the liver, nearly the entire left hepatic lobe, with confluent infiltrative restricted diffusion and heterogeneous enhancement measuring approximately 21 by 7.4 cm. Diffuse metastatic disease is strongly suspected with multifocal lesions in the right hepatic lobe. Superimposed infection is difficult to exclude though areas at the boundary of these lesions show no overt signs of edema. 2. Highly narrowed or occluded left portal vein with potential thrombus versus slow flow in the splenoportal confluence. This was patent on the exam of 12/19/2018; however, given above findings a venous phase CT or even ultrasound hepatic Doppler with special attention to the splenic portal confluence may be helpful to exclude this possibility, finding is also exhibited on TrueFISP sequence (image 24, series 10). 3. 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 4. Mild ductal distension in the tail of the pancreas, potentially due to mass effect. Attention on follow-up. 5. Critical Value/emergent results were called by telephone at the time of interpretation on 12/19/2018 at 6:43 pm to [place] , who verbally acknowledged these results. Electronically Signed   By: Zetta Bills M.D.   On: 12/19/2018 18:24   US Abdomen Complete  Result Date: 12/19/2018 CLINICAL DATA:  Abdominal pain EXAM: ABDOMEN ULTRASOUND COMPLETE COMPARISON:  MRI earlier today FINDINGS: Gallbladder: Gallbladder is contracted with associated mild wall  thickening measuring up to 4 mm. Sludge noted within the gallbladder. No visible stones. Common bile duct: Diameter: Normal caliber, 3 mm Liver: Diffusely heterogeneous echotexture throughout the liver corresponding to the abnormality seen on today's MRI suspicious forinfiltrating mass. Main portal vein is patent on color Doppler imaging with normal direction of blood flow towards the liver. The area of possible thrombus seen on MRI at the level of the splenic portal confluence not visualized. IVC: No abnormality visualized. Pancreas: Visualized portion unremarkable. Spleen: Size and appearance within normal limits. Right Kidney: Length: 11.0 cm. Echogenicity within normal limits. No mass or hydronephrosis visualized. Left Kidney: Length: 10.5 cm. Echogenicity within normal limits. No mass or hydronephrosis  visualized. Abdominal aorta: No aneurysm visualized. Other findings: None. IMPRESSION: Heterogeneous appearance throughout much of the left hepatic lobe as seen on today's MRI concerning for infiltrating mass/tumor. Main portal vein is patent. Contracted gallbladder with gallbladder wall thickening likely related to contracted state. Sludge within the gallbladder. No sonographic Murphy sign or visible stones. Electronically Signed   By: Rolm Baptise M.D.   On: 12/19/2018 20:35   Ct Abdomen Pelvis W Contrast  Result Date: 12/19/2018 CLINICAL DATA:  Chest pain, complex, intermediate/high prob of ACS/PE/AAS; Abd distension Epigastric pain. Exertional dyspnea. Cough. Weakness. History of breast cancer. EXAM: CT ANGIOGRAPHY CHEST CT ABDOMEN AND PELVIS WITH CONTRAST TECHNIQUE: Multidetector CT imaging of the chest was performed using the standard protocol during bolus administration of intravenous contrast. Multiplanar CT image reconstructions and MIPs were obtained to evaluate the vascular anatomy. Multidetector CT imaging of the abdomen and pelvis was performed using the standard protocol during bolus  administration of intravenous contrast. No reported contrast reaction. CONTRAST:  120mL OMNIPAQUE IOHEXOL 350 MG/ML SOLN COMPARISON:  Abdominal CT without contrast yesterday. Chest CT 01/01/2018 FINDINGS: CTA CHEST FINDINGS Cardiovascular: There are no filling defects within the pulmonary arteries to suggest pulmonary embolus. Thoracic aorta is normal in caliber without dissection. Left vertebral artery arises directly from the thoracic aorta, variant arch anatomy. Heart is normal in size. No pericardial effusion. Right chest port in place with tip in the SVC. Mediastinum/Nodes: No mediastinal or hilar adenopathy. No visualized thyroid nodule. No esophageal wall thickening. Lungs/Pleura: Mild patchy areas of subpleural nodular and ground-glass opacities. Basilar findings are unchanged from CT yesterday. 5 mm right upper lobe pulmonary nodule series 6, image 41. Sub solid nodularity slightly more inferiorly in the right lung series 6, image 45 measuring 8 mm. Tiny 2 mm nodule in the right upper lung series 6, image 32, may be fissural. Pulmonary nodules are new from prior chest CT. Perifissural in subpleural opacity in the right upper lobe, also series 6, image 32, nonspecific. No pulmonary edema. No pleural fluid. Trachea and mainstem bronchi are patent. Musculoskeletal: There are no acute or suspicious osseous abnormalities. Review of the MIP images confirms the above findings. CT ABDOMEN and PELVIS FINDINGS Hepatobiliary: Hepatic parenchyma diffusely heterogeneous with nodular contours. Liver is increased in size from December 2019 CT. Gallbladder is decompressed and not well assessed, intraluminal high density may be stones or sludge. Questionable wall thickening may be related to nondistention. Pancreas: No ductal dilatation or inflammation. Spleen: Normal in size without focal abnormality. Adrenals/Urinary Tract: Normal adrenal glands. No hydronephrosis or perinephric edema. Homogeneous renal enhancement with  symmetric excretion on delayed phase imaging. Urinary bladder is partially distended, mild bladder wall thickening. Stomach/Bowel: Lack of enteric contrast and paucity of intra-abdominal fat limits detailed bowel assessment. The stomach is nondistended. Questionable pre-pyloric gastric wall thickening. No small bowel obstruction or inflammatory change. Appendix not discretely visualized, no pericecal inflammation to suggest appendicitis. Transverse colon is tortuous. Mild colonic wall thickening at the splenic flexure, colon is decompressed, this is not well assessed. Questionable areas of descending colonic wall thickening, for example series 11, image 61. Vascular/Lymphatic: Right portal vein is patent. The left portal vein is attenuated without evidence of thrombus. Tortuous splenic vein with prominent collaterals in the left upper quadrant and left retroperitoneum. 9 mm perigastric lymph node series 11, image 29, nonspecific. No pelvic adenopathy. Reproductive: Uterus surgically absent. Left ovary tentatively visualized and normal. Right ovary not definitively seen. No obvious adnexal mass. Other: Small volume of pelvic ascites  measuring simple fluid density, similar to yesterday. Small perihepatic ascites anteriorly. No free air. Musculoskeletal: There are no acute or suspicious osseous abnormalities. Scattered bone islands in the pelvis. Review of the MIP images confirms the above findings. IMPRESSION: CT chest: 1. No pulmonary embolus. 2. Mild patchy areas of subpleural nodular and ground-glass opacities in both lungs. Slightly more confluent subpleural/perifissural right upper lobe opacity. Findings may be due to atelectasis or infection. 3. Small pulmonary nodules are new from December, infectious/inflammatory or metastatic. CT abdomen/pelvis: 1. Diffusely heterogeneous liver with nodular contours. Liver has increased in size since December 2019 CT. This may be due to underlying cirrhotic change versus  diffuse metastatic disease. Recommend further evaluation with abdominal MRI. Left upper quadrant and retroperitoneal collaterals can be seen with portal hypertension. Left portal vein is attenuated, however no discrete thrombus. 2. Questionable pre-pyloric gastric wall thickening, peptic ulcer disease versus gastritis. 3. Bladder wall thickening, small volume abdominopelvic ascites, and areas of colonic wall thickening at the splenic flexure and descending colon are unchanged from CT yesterday. 4. Gallbladder is decompressed and not well assessed, intraluminal high density may be stones or sludge. Questionable gallbladder wall thickening may be related to nondistention or liver disease. This could be further evaluated with MRI or right upper quadrant ultrasound. Electronically Signed   By: Keith Rake M.D.   On: 12/19/2018 05:33   Dg Chest Portable 1 View  Result Date: 12/18/2018 CLINICAL DATA:  Fever abdominal pain EXAM: PORTABLE CHEST 1 VIEW COMPARISON:  12/31/2017 FINDINGS: Right-sided central venous port with tip over the cavoatrial region. No focal airspace disease or effusion. Normal heart size. No pneumothorax. IMPRESSION: No active disease. Electronically Signed   By: Donavan Foil M.D.   On: 12/18/2018 19:49    Labs:  CBC: Recent Labs    12/16/18 1328 12/18/18 1433 12/19/18 0557 12/20/18 0457  WBC 4.2 3.6* 2.5* 4.6  HGB 11.4* 11.5* 11.8* 11.0*  HCT 33.1* 33.4* 33.6* 30.9*  PLT 60* 46* 47* 55*    COAGS: Recent Labs    12/19/18 1355 12/20/18 0457  INR 1.3* 1.2    BMP: Recent Labs    10/09/18 1049 12/16/18 1328 12/18/18 1433 12/19/18 0557  NA 138 133* 135 135  K 4.0 3.9 4.2 4.3  CL 101 99 96* 97*  CO2 24 27 26 24   GLUCOSE 112* 143* 137* 153*  BUN 20 14 9 11   CALCIUM 10.1 9.1 9.3 9.0  CREATININE 0.59 0.65 0.71 0.63  GFRNONAA >60 >60 >60 >60  GFRAA >60 >60 >60 >60    LIVER FUNCTION TESTS: Recent Labs    10/09/18 1049 12/16/18 1328 12/18/18 1433  12/19/18 0557  BILITOT 0.6 1.0 1.2 1.2  AST 26 139* 188* 173*  ALT 22 80* 119* 115*  ALKPHOS 55 112 125 104  PROT 7.2 7.1 7.1 6.8  ALBUMIN 4.5 3.8 3.7 3.4*    TUMOR MARKERS: No results for input(s): AFPTM, CEA, CA199, CHROMGRNA in the last 8760 hours.  Assessment and Plan:  For US guided liver biopsy today. MRI shows more geographic abnormality in the left lobe compared to right, suspicious for tumor. Did receive one unit of platelets this AM for thrombocytopenia prior to biopsy.  Risks and benefits of liver biopsy was discussed with the patient and/or patient's family including, but not limited to bleeding, infection, damage to adjacent structures or low yield requiring additional tests. All of the questions were answered and there is agreement to proceed. Consent signed and in chart.  Thank  you for this interesting consult.  I greatly enjoyed [place] and look forward to participating in their care.  A copy of this report was sent to the requesting provider on this date.  Electronically Signed: Azzie Roup, MD 12/20/2018, 12:50 PM   I spent a total of 20 Minutes in face to face in clinical consultation, greater than 50% of which was counseling/coordinating care for liver biopsy.

## 2018-12-20 NOTE — Progress Notes (Signed)
Initial Nutrition Assessment  DOCUMENTATION CODES:   Not applicable  INTERVENTION:   Ensure Enlive po TID, each supplement provides 350 kcal and 20 grams of protein  MVI daily   NUTRITION DIAGNOSIS:   Increased nutrient needs related to cancer and cancer related treatments as evidenced by increased estimated needs.  GOAL:   Patient will meet greater than or equal to 90% of their needs  MONITOR:   PO intake, Supplement acceptance, Labs, Weight trends, Skin, I & O's  REASON FOR ASSESSMENT:   Malnutrition Screening Tool    ASSESSMENT:   48 y.o. 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 CT and MRI demonstrated diffuse ill-defined liver lesions suspicious for metastatic disease and small amount of ascites   Pt in procedure at time of RD visit. Pt currently NPO for biopsy today. Per chart review, pt appears weight stable pta. Pt's UBW appears to be ~140-145lbs. RD will add supplements to help pt meet her estimated needs once diet advanced. RD will obtain nutrition related history and exam at follow-up.   Pt at high risk for malnutrition.   Medications reviewed and include: D3, lovaza, protonix, carafate  Labs reviewed: AST 173(H), ALT 115(H)  Unable to complete Nutrition-Focused physical exam at this time.   Diet Order:   Diet Order            Diet regular Room service appropriate? Yes; Fluid consistency: Thin  Diet effective now             EDUCATION NEEDS:   Not appropriate for education at this time  Skin:  Skin Assessment: Reviewed RN Assessment  Last BM:  11/18  Height:   Ht Readings from Last 1 Encounters:  12/19/18 5\' 6"  (1.676 m)    Weight:   Wt Readings from Last 1 Encounters:  12/19/18 65.6 kg    Ideal Body Weight:  59 kg  BMI:  Body mass index is 23.34  kg/m.  Estimated Nutritional Needs:   Kcal:  1600-1800kcal/day  Protein:  80-90g/day  Fluid:  >1.7L/day  Shelby Distance MS, RD, LDN Pager #- [phone] Office#- [phone] After Hours Pager: [phone]

## 2018-12-20 NOTE — Procedures (Signed)
Interventional Radiology Procedure Note  Procedure: US Guided Biopsy of liver  Complications: None  Estimated Blood Loss: < 10 mL  Findings: 18 G core biopsy of left lobe liver lesions performed under US guidance.  Three core samples obtained and sent to Pathology. Gelfoam slurry injected into tract on completion.  Venetia Night. Kathlene Cote, M.D Pager:  [phone]

## 2018-12-20 NOTE — Progress Notes (Signed)
Pt being discharged home, discharge instructions reviewed with pt, states understanding, sister to transport pt

## 2018-12-21 ENCOUNTER — Other Ambulatory Visit: Payer: Self-pay

## 2018-12-21 ENCOUNTER — Emergency Department: Payer: No Typology Code available for payment source

## 2018-12-21 ENCOUNTER — Inpatient Hospital Stay: Payer: No Typology Code available for payment source

## 2018-12-21 ENCOUNTER — Inpatient Hospital Stay
Admission: EM | Admit: 2018-12-21 | Discharge: 2018-12-27 | DRG: 391 | Disposition: A | Discharge status: Home / Self Care | Payer: No Typology Code available for payment source | Attending: Internal Medicine | Admitting: Internal Medicine

## 2018-12-21 ENCOUNTER — Encounter: Payer: Self-pay | Admitting: Intensive Care

## 2018-12-21 DIAGNOSIS — Z853 Personal history of malignant neoplasm of breast: Secondary | ICD-10-CM

## 2018-12-21 DIAGNOSIS — R1013 Epigastric pain: Secondary | ICD-10-CM | POA: Diagnosis present

## 2018-12-21 DIAGNOSIS — Z87891 Personal history of nicotine dependence: Secondary | ICD-10-CM | POA: Diagnosis not present

## 2018-12-21 DIAGNOSIS — Z803 Family history of malignant neoplasm of breast: Secondary | ICD-10-CM

## 2018-12-21 DIAGNOSIS — R0781 Pleurodynia: Secondary | ICD-10-CM | POA: Diagnosis present

## 2018-12-21 DIAGNOSIS — R1011 Right upper quadrant pain: Secondary | ICD-10-CM | POA: Diagnosis not present

## 2018-12-21 DIAGNOSIS — E872 Acidosis: Secondary | ICD-10-CM | POA: Diagnosis present

## 2018-12-21 DIAGNOSIS — Z791 Long term (current) use of non-steroidal anti-inflammatories (NSAID): Secondary | ICD-10-CM

## 2018-12-21 DIAGNOSIS — J9691 Respiratory failure, unspecified with hypoxia: Secondary | ICD-10-CM | POA: Diagnosis present

## 2018-12-21 DIAGNOSIS — Z171 Estrogen receptor negative status [ER-]: Secondary | ICD-10-CM

## 2018-12-21 DIAGNOSIS — K7460 Unspecified cirrhosis of liver: Secondary | ICD-10-CM | POA: Diagnosis present

## 2018-12-21 DIAGNOSIS — C50912 Malignant neoplasm of unspecified site of left female breast: Secondary | ICD-10-CM

## 2018-12-21 DIAGNOSIS — G43909 Migraine, unspecified, not intractable, without status migrainosus: Secondary | ICD-10-CM | POA: Diagnosis present

## 2018-12-21 DIAGNOSIS — D696 Thrombocytopenia, unspecified: Secondary | ICD-10-CM | POA: Diagnosis present

## 2018-12-21 DIAGNOSIS — C787 Secondary malignant neoplasm of liver and intrahepatic bile duct: Secondary | ICD-10-CM | POA: Diagnosis present

## 2018-12-21 DIAGNOSIS — R109 Unspecified abdominal pain: Secondary | ICD-10-CM | POA: Diagnosis not present

## 2018-12-21 DIAGNOSIS — Z20828 Contact with and (suspected) exposure to other viral communicable diseases: Secondary | ICD-10-CM | POA: Diagnosis present

## 2018-12-21 DIAGNOSIS — R188 Other ascites: Secondary | ICD-10-CM | POA: Diagnosis present

## 2018-12-21 DIAGNOSIS — Z978 Presence of other specified devices: Secondary | ICD-10-CM | POA: Diagnosis not present

## 2018-12-21 DIAGNOSIS — I10 Essential (primary) hypertension: Secondary | ICD-10-CM | POA: Diagnosis present

## 2018-12-21 DIAGNOSIS — D649 Anemia, unspecified: Secondary | ICD-10-CM | POA: Insufficient documentation

## 2018-12-21 DIAGNOSIS — F419 Anxiety disorder, unspecified: Secondary | ICD-10-CM | POA: Diagnosis present

## 2018-12-21 DIAGNOSIS — Z79899 Other long term (current) drug therapy: Secondary | ICD-10-CM

## 2018-12-21 DIAGNOSIS — C50919 Malignant neoplasm of unspecified site of unspecified female breast: Secondary | ICD-10-CM | POA: Diagnosis not present

## 2018-12-21 DIAGNOSIS — Z9012 Acquired absence of left breast and nipple: Secondary | ICD-10-CM

## 2018-12-21 DIAGNOSIS — K219 Gastro-esophageal reflux disease without esophagitis: Secondary | ICD-10-CM | POA: Diagnosis present

## 2018-12-21 DIAGNOSIS — F17200 Nicotine dependence, unspecified, uncomplicated: Secondary | ICD-10-CM | POA: Diagnosis present

## 2018-12-21 DIAGNOSIS — Z90710 Acquired absence of both cervix and uterus: Secondary | ICD-10-CM

## 2018-12-21 DIAGNOSIS — J9811 Atelectasis: Secondary | ICD-10-CM | POA: Diagnosis present

## 2018-12-21 DIAGNOSIS — L299 Pruritus, unspecified: Secondary | ICD-10-CM | POA: Diagnosis present

## 2018-12-21 DIAGNOSIS — R160 Hepatomegaly, not elsewhere classified: Secondary | ICD-10-CM | POA: Diagnosis present

## 2018-12-21 DIAGNOSIS — Z95828 Presence of other vascular implants and grafts: Secondary | ICD-10-CM | POA: Diagnosis not present

## 2018-12-21 DIAGNOSIS — R509 Fever, unspecified: Secondary | ICD-10-CM | POA: Diagnosis not present

## 2018-12-21 DIAGNOSIS — R7401 Elevation of levels of liver transaminase levels: Secondary | ICD-10-CM | POA: Diagnosis present

## 2018-12-21 DIAGNOSIS — R6510 Systemic inflammatory response syndrome (SIRS) of non-infectious origin without acute organ dysfunction: Secondary | ICD-10-CM | POA: Diagnosis present

## 2018-12-21 DIAGNOSIS — Z881 Allergy status to other antibiotic agents status: Secondary | ICD-10-CM | POA: Diagnosis not present

## 2018-12-21 DIAGNOSIS — Z9221 Personal history of antineoplastic chemotherapy: Secondary | ICD-10-CM | POA: Diagnosis not present

## 2018-12-21 DIAGNOSIS — J45909 Unspecified asthma, uncomplicated: Secondary | ICD-10-CM | POA: Diagnosis present

## 2018-12-21 DIAGNOSIS — Z515 Encounter for palliative care: Secondary | ICD-10-CM | POA: Diagnosis present

## 2018-12-21 DIAGNOSIS — E860 Dehydration: Secondary | ICD-10-CM | POA: Diagnosis present

## 2018-12-21 DIAGNOSIS — Z833 Family history of diabetes mellitus: Secondary | ICD-10-CM

## 2018-12-21 LAB — URINALYSIS, COMPLETE (UACMP) WITH MICROSCOPIC
Bilirubin Urine: NEGATIVE
Glucose, UA: NEGATIVE mg/dL
Ketones, ur: NEGATIVE mg/dL
Leukocytes,Ua: NEGATIVE
Nitrite: NEGATIVE
Protein, ur: NEGATIVE mg/dL
Specific Gravity, Urine: 1.012 (ref 1.005–1.030)
pH: 6 (ref 5.0–8.0)

## 2018-12-21 LAB — PREPARE PLATELET PHERESIS: Unit division: 0

## 2018-12-21 LAB — BPAM PLATELET PHERESIS
Blood Product Expiration Date: 202011232359
ISSUE DATE / TIME: 202011200958
Unit Type and Rh: 6200

## 2018-12-21 LAB — CBC
HCT: 33.2 % — ABNORMAL LOW (ref 36.0–46.0)
Hemoglobin: 11.8 g/dL — ABNORMAL LOW (ref 12.0–15.0)
MCH: 32.7 pg (ref 26.0–34.0)
MCHC: 35.5 g/dL (ref 30.0–36.0)
MCV: 92 fL (ref 80.0–100.0)
Platelets: 62 10*3/uL — ABNORMAL LOW (ref 150–400)
RBC: 3.61 MIL/uL — ABNORMAL LOW (ref 3.87–5.11)
RDW: 15.9 % — ABNORMAL HIGH (ref 11.5–15.5)
WBC: 6.9 10*3/uL (ref 4.0–10.5)
nRBC: 0 % (ref 0.0–0.2)

## 2018-12-21 LAB — CANCER ANTIGEN 27.29: CA 27.29: 962.1 U/mL — ABNORMAL HIGH (ref 0.0–38.6)

## 2018-12-21 LAB — COMPREHENSIVE METABOLIC PANEL
ALT: 158 U/L — ABNORMAL HIGH (ref 0–44)
AST: 269 U/L — ABNORMAL HIGH (ref 15–41)
Albumin: 3.5 g/dL (ref 3.5–5.0)
Alkaline Phosphatase: 116 U/L (ref 38–126)
Anion gap: 12 (ref 5–15)
BUN: 12 mg/dL (ref 6–20)
CO2: 26 mmol/L (ref 22–32)
Calcium: 8.6 mg/dL — ABNORMAL LOW (ref 8.9–10.3)
Chloride: 91 mmol/L — ABNORMAL LOW (ref 98–111)
Creatinine, Ser: 0.72 mg/dL (ref 0.44–1.00)
GFR calc Af Amer: >60 mL/min (ref >60)
GFR calc non Af Amer: >60 mL/min (ref >60)
Glucose, Bld: 139 mg/dL — ABNORMAL HIGH (ref 70–99)
Potassium: 4 mmol/L (ref 3.5–5.1)
Sodium: 129 mmol/L — ABNORMAL LOW (ref 135–145)
Total Bilirubin: 1.1 mg/dL (ref 0.3–1.2)
Total Protein: 6.9 g/dL (ref 6.5–8.1)

## 2018-12-21 LAB — LACTIC ACID, PLASMA
Lactic Acid, Venous: 2.7 mmol/L (ref 0.5–1.9)
Lactic Acid, Venous: 2.2 mmol/L (ref 0.5–1.9)

## 2018-12-21 LAB — LIPASE, BLOOD: Lipase: 27 U/L (ref 11–51)

## 2018-12-21 MED ORDER — HYDROMORPHONE HCL 1 MG/ML IJ SOLN
0.5000 mg | INTRAMUSCULAR | Status: DC | PRN
  Administered 2018-12-21 – 2018-12-27 (×13): 0.5 mg via INTRAVENOUS
  Filled 2018-12-21 (×13): qty 0.5

## 2018-12-21 MED ORDER — LACTATED RINGERS IV BOLUS
1000.0000 mL | Freq: Once | INTRAVENOUS | Status: AC
  Administered 2018-12-21: 1000 mL via INTRAVENOUS

## 2018-12-21 MED ORDER — ALPRAZOLAM 0.25 MG PO TABS
0.2500 mg | ORAL_TABLET | Freq: Two times a day (BID) | ORAL | Status: DC | PRN
  Administered 2018-12-22 – 2018-12-27 (×4): 0.25 mg via ORAL
  Filled 2018-12-21 (×4): qty 1

## 2018-12-21 MED ORDER — LOSARTAN POTASSIUM 50 MG PO TABS
50.0000 mg | ORAL_TABLET | Freq: Every day | ORAL | Status: DC
  Administered 2018-12-21 – 2018-12-26 (×6): 50 mg via ORAL
  Filled 2018-12-21 (×6): qty 1

## 2018-12-21 MED ORDER — DIPHENHYDRAMINE HCL 50 MG/ML IJ SOLN
50.0000 mg | Freq: Once | INTRAMUSCULAR | Status: AC | PRN
  Administered 2018-12-21: 50 mg via INTRAVENOUS
  Filled 2018-12-21: qty 1

## 2018-12-21 MED ORDER — SODIUM CHLORIDE 0.9 % IV BOLUS
500.0000 mL | Freq: Once | INTRAVENOUS | Status: AC
  Administered 2018-12-21: 500 mL via INTRAVENOUS

## 2018-12-21 MED ORDER — ONDANSETRON HCL 4 MG/2ML IJ SOLN
4.0000 mg | Freq: Once | INTRAMUSCULAR | Status: AC
  Administered 2018-12-21: 13:00:00 4 mg via INTRAVENOUS
  Filled 2018-12-21: qty 2

## 2018-12-21 MED ORDER — FLUTICASONE PROPIONATE 50 MCG/ACT NA SUSP
1.0000 | Freq: Every day | NASAL | Status: DC
  Filled 2018-12-21 (×3): qty 16

## 2018-12-21 MED ORDER — IOHEXOL 350 MG/ML SOLN: 75.0000 mL | Freq: Once | INTRAVENOUS | Status: DC | PRN

## 2018-12-21 MED ORDER — LOSARTAN POTASSIUM 50 MG PO TABS: 50.0000 mg | ORAL_TABLET | Freq: Every day | ORAL | Status: DC

## 2018-12-21 MED ORDER — PANTOPRAZOLE SODIUM 40 MG PO TBEC
40.0000 mg | DELAYED_RELEASE_TABLET | Freq: Every day | ORAL | Status: DC
  Administered 2018-12-22 – 2018-12-27 (×6): 40 mg via ORAL
  Filled 2018-12-21 (×6): qty 1

## 2018-12-21 MED ORDER — METHYLPREDNISOLONE SODIUM SUCC 40 MG IJ SOLR
40.0000 mg | INTRAMUSCULAR | Status: AC
  Administered 2018-12-21: 40 mg via INTRAVENOUS
  Filled 2018-12-21: qty 1

## 2018-12-21 MED ORDER — SUCRALFATE 1 G PO TABS
1.0000 g | ORAL_TABLET | Freq: Three times a day (TID) | ORAL | Status: DC
  Administered 2018-12-21 – 2018-12-27 (×23): 1 g via ORAL
  Filled 2018-12-21 (×22): qty 1

## 2018-12-21 MED ORDER — VITAMIN D3 25 MCG (1000 UNIT) PO TABS
2000.0000 [IU] | ORAL_TABLET | Freq: Every evening | ORAL | Status: DC
  Administered 2018-12-22 – 2018-12-26 (×5): 2000 [IU] via ORAL
  Filled 2018-12-21 (×11): qty 2

## 2018-12-21 MED ORDER — IOHEXOL 350 MG/ML SOLN
75.0000 mL | Freq: Once | INTRAVENOUS | Status: AC | PRN
  Administered 2018-12-21: 75 mL via INTRAVENOUS

## 2018-12-21 MED ORDER — MONTELUKAST SODIUM 10 MG PO TABS
10.0000 mg | ORAL_TABLET | Freq: Every day | ORAL | Status: DC
  Filled 2018-12-21 (×5): qty 1

## 2018-12-21 MED ORDER — HYDROMORPHONE HCL 1 MG/ML IJ SOLN
0.5000 mg | INTRAMUSCULAR | Status: AC
  Administered 2018-12-21: 0.5 mg via INTRAVENOUS
  Filled 2018-12-21: qty 1

## 2018-12-21 MED ORDER — HYDROMORPHONE HCL 1 MG/ML PO LIQD: 0.5000 mg | ORAL | Status: DC | PRN

## 2018-12-21 MED ORDER — ONDANSETRON HCL 4 MG/2ML IJ SOLN
4.0000 mg | Freq: Four times a day (QID) | INTRAMUSCULAR | Status: DC | PRN
  Administered 2018-12-22: 4 mg via INTRAVENOUS
  Filled 2018-12-21: qty 2

## 2018-12-21 NOTE — ED Notes (Signed)
First Nurse Note: Pt to ED via POV, pt had liver biopsy yesterday, states that she has been having severe abdominal pain all night. Pt has low Plt count and her oncologist was concerned for internal bleeding. Pt takes chemo pills, last took 2 weeks ago.

## 2018-12-21 NOTE — H&P (Signed)
History and Physical    Shelby [name] [id number] DOB: 1971/01/18 DOA: 12/21/2018  PCP: Jerrol Banana., MD (Confirm with patient/family/NH records and if not entered, this has to be entered at [facility] point of entry) Patient coming from: Home  I have personally briefly reviewed patient's old medical records in [place]  Chief Complaint: Abdominal pain.  HPI: Shelby [name] is a 48 y.o. female with medical history significant of breast cancer,anemia, Seen in ed for right upper quadrant pain that was 10/10 yesterday intermittently , pt was discharged yesterday in evening with tramadol.generally she has low temp and 97.1, when she went to see dr.fiinagan 99.5 is a fever for her. She had a fever today at home 99.5.pt was discharged yesterday after liver biopsy yesterday pt was given tramadol for pain.  ED Course:  Blood pressure (!) 145/98, pulse 95, temperature 99.5 F (37.5 C), temperature source Oral, resp. rate 18, height 5\' 6"  (1.676 m), weight 64.9 kg, SpO2 92 %. Pt is alert awake and oriented and gives history.   Review of Systems: As per HPI otherwise 10 point review of systems negative.  Pos for abd pain/ fever / light headed/ no nausea . Pt was able to eat at home. Some yogurt last night and this am.  Past Medical History:  Diagnosis Date  . Anemia    h/o with pregnancy  . Anxiety   . Asthma    allergy induced-no inhaler  . Cancer [facility])    breast left   . Complication of anesthesia   . Environmental allergies   . Family history of adverse reaction to anesthesia    son-breathing problems-coded 1st time when he was 2 and had another surgery at 63 and had to be admitted for breathing problems  . Family history of breast cancer   . GERD (gastroesophageal reflux disease)   . Headache    migraines  . Hypertension   . Mitral valve disease   . Mitral valve prolapse   . Painful menstrual periods   . PONV (postoperative nausea and vomiting)   . Vertigo      Past Surgical History:  Procedure Laterality Date  . ABDOMINAL HYSTERECTOMY  2010   supracervical   . AXILLARY LYMPH NODE DISSECTION Left 07/12/2018   Procedure: AXILLARY LYMPH NODE DISSECTION;  Surgeon: Robert Bellow, MD;  Location: [facility] ORS;  Service: General;  Laterality: Left;  . BREAST BIOPSY Left 11/2017   invasive ductal carcinoma and metastatic LN  . BREAST BIOPSY WITH SENTINEL LYMPH NODE BIOPSY AND NEEDLE LOCALIZATION Left 07/12/2018   Procedure: BREAST BIOPSY WIDE EXCISION WITH SENTINEL NODE  AND NEEDLE LOCALIZATION OF OXILLARY NODS LEFT;  Surgeon: Robert Bellow, MD;  Location: [facility] ORS;  Service: General;  Laterality: Left;  . BUNIONECTOMY Bilateral   . MANDIBLE RECONSTRUCTION     age 21-underbite  . PORTACATH PLACEMENT Right 12/31/2017   Procedure: INSERTION PORT-A-CATH;  Surgeon: Robert Bellow, MD;  Location: [facility] ORS;  Service: General;  Laterality: Right;  . RE-EXCISION OF BREAST LUMPECTOMY Left 07/24/2018   Procedure: RE-EXCISION OF BREAST LUMPECTOMY LEFT;  Surgeon: Robert Bellow, MD;  Location: [facility] ORS;  Service: General;  Laterality: Left;  . SHOULDER ARTHROSCOPY WITH OPEN ROTATOR CUFF REPAIR Right 04/26/2017   Procedure: SHOULDER ARTHROSCOPY WITH OPEN ROTATOR CUFF REPAIR,SUBACROMINAL DECOMPRESSION;  Surgeon: Thornton Park, MD;  Location: [facility] ORS;  Service: Orthopedics;  Laterality: Right;  . TONSILLECTOMY       reports that she quit  smoking about 11 years ago. Her smoking use included cigarettes. She has a 5.00 pack-year smoking history. She has never used smokeless tobacco. She reports current alcohol use. She reports that she does not use drugs.  Allergies  Allergen Reactions  . Hydrocodone Hives    Swollen face - many years ago. Thinks she has tolerated since.  . Iodine Hives  . Peanut Butter Flavor Other (See Comments)    Made mouth tingle  . Shellfish Allergy Hives  . Vancomycin Other (See Comments)    Itching and forehead turned red     Family History  Problem Relation Age of Onset  . Breast cancer Mother 61  . Diabetes Father   . Cancer Maternal Uncle        colon  . Breast cancer Maternal Grandmother 70   Unacceptable: Noncontributory, unremarkable, or negative. Acceptable: Family history reviewed and not pertinent (If you reviewed it)  Prior to Admission medications   Medication Sig Start Date End Date Taking? Authorizing Provider  ALPRAZolam Duanne Moron) 0.5 MG tablet Take 0.5-1 tablets (0.25-0.5 mg total) by mouth 2 (two) times daily as needed for anxiety. Patient not taking: Reported on 11/22/2018 06/11/18   Verlon Au, NP  capecitabine (XELODA) 500 MG tablet Take 4 tablets (2,000 mg total) by mouth 2 (two) times daily after a meal. Take for 14 days, then hold for 7 days. Repeat every 21 days. 10/10/18   Lloyd Huger, MD  Cholecalciferol (VITAMIN D) 50 MCG (2000 UT) CAPS Take 2,000 Units by mouth every evening.     [provider]  famotidine (PEPCID) 20 MG tablet Take 20 mg by mouth daily as needed for heartburn.     [provider]  ibuprofen (ADVIL,MOTRIN) 200 MG tablet Take 200 mg by mouth daily as needed for mild pain.     [provider]  losartan (COZAAR) 50 MG tablet TAKE 1 TABLET BY MOUTH DAILY Patient taking differently: Take 50 mg by mouth daily.  11/16/18   Jerrol Banana., MD  mometasone (NASONEX) 50 MCG/ACT nasal spray Place 2 sprays into the nose daily. Patient taking differently: Place 2 sprays into the nose daily as needed (allergies).  04/18/18   Lloyd Huger, MD  montelukast (SINGULAIR) 10 MG tablet TAKE 1 TABLET BY MOUTH AT BEDTIME Patient taking differently: Take 10 mg by mouth daily as needed (allergies).  04/17/18   Jerrol Banana., MD  Multiple Vitamin (MULTIVITAMIN WITH MINERALS) TABS tablet Take 1 tablet by mouth daily. 12/21/18   Fritzi Mandes, MD  Omega-3 1000 MG CAPS Take 1,000 mg by mouth every evening.     [provider]   omeprazole (PRILOSEC) 20 MG capsule Take 1 capsule (20 mg total) by mouth daily. 12/17/18   Jerrol Banana., MD  sucralfate (CARAFATE) 1 g tablet Take 1 tablet (1 g total) by mouth 4 (four) times daily -  with meals and at bedtime. 12/17/18   Jerrol Banana., MD  traMADol (ULTRAM) 50 MG tablet Take 1 tablet (50 mg total) by mouth every 12 (twelve) hours as needed for severe pain. 12/20/18   Fritzi Mandes, MD  prochlorperazine (COMPAZINE) 10 MG tablet Take 1 tablet (10 mg total) by mouth every 6 (six) hours as needed (Nausea or vomiting). 12/24/17 10/09/18  Lloyd Huger, MD    Physical Exam: Vitals:   12/21/18 0958 12/21/18 1003 12/21/18 1202  BP:  (!) 148/91 (!) 145/98  Pulse:  (!) 118 95  Resp:  18 18  Temp:  99.5 F (37.5 C)   TempSrc:  Oral   SpO2:  93% 92%  Weight: 64.9 kg    Height: 5\' 6"  (1.676 m)      Constitutional: NAD, calm, comfortable Vitals:   12/21/18 0958 12/21/18 1003 12/21/18 1202  BP:  (!) 148/91 (!) 145/98  Pulse:  (!) 118 95  Resp:  18 18  Temp:  99.5 F (37.5 C)   TempSrc:  Oral   SpO2:  93% 92%  Weight: 64.9 kg    Height: 5\' 6"  (1.676 m)     Eyes: PERRL, lids and conjunctivae normal ENMT: Mucous membranes are moist. Posterior pharynx clear of any exudate or lesions.Normal dentition.  Neck: normal, supple, no masses, no thyromegaly Respiratory: clear to auscultation bilaterally, no wheezing, no crackles. Normal respiratory effort. No accessory muscle use.  Cardiovascular: Regular rate and rhythm, no murmurs / rubs / gallops. No extremity edema. 2+ pedal pulses. No carotid bruits.  Abdomen: no tenderness, no masses palpated. No hepatosplenomegaly. Bowel sounds positive.  Musculoskeletal: no clubbing / cyanosis. No joint deformity upper and lower extremities. Good ROM, no contractures. Normal muscle tone.  Skin: no rashes, lesions, ulcers. No induration Neurologic: CN 2-12 grossly intact. Sensation intact, DTR normal. Strength 5/5 in all  4.  Psychiatric: Normal judgment and insight. Alert and oriented x 3. Normal mood.   Labs on Admission: I have personally reviewed following labs and imaging studies  CBC: Recent Labs  Lab 12/16/18 1328 12/18/18 1433 12/19/18 0557 12/20/18 0457 12/21/18 1009  WBC 4.2 3.6* 2.5* 4.6 6.9  NEUTROABS 3.1  --  2.2  --   --   HGB 11.4* 11.5* 11.8* 11.0* 11.8*  HCT 33.1* 33.4* 33.6* 30.9* 33.2*  MCV 95.1 95.7 94.4 91.2 92.0  PLT 60* 46* 47* 55* 62*   Basic Metabolic Panel: Recent Labs  Lab 12/16/18 1328 12/18/18 1433 12/19/18 0557 12/21/18 1009  NA 133* 135 135 129*  K 3.9 4.2 4.3 4.0  CL 99 96* 97* 91*  CO2 27 26 24 26   GLUCOSE 143* 137* 153* 139*  BUN 14 9 11 12   CREATININE 0.65 0.71 0.63 0.72  CALCIUM 9.1 9.3 9.0 8.6*   GFR: Estimated Creatinine Clearance: 78.8 mL/min (by C-G formula based on SCr of 0.72 mg/dL). Liver Function Tests: Recent Labs  Lab 12/16/18 1328 12/18/18 1433 12/19/18 0557 12/21/18 1009  AST 139* 188* 173* 269*  ALT 80* 119* 115* 158*  ALKPHOS 112 125 104 116  BILITOT 1.0 1.2 1.2 1.1  PROT 7.1 7.1 6.8 6.9  ALBUMIN 3.8 3.7 3.4* 3.5   Recent Labs  Lab 12/18/18 1433 12/21/18 1009  LIPASE 28 27   No results for input(s): AMMONIA in the last 168 hours. Coagulation Profile: Recent Labs  Lab 12/19/18 1355 12/20/18 0457  INR 1.3* 1.2   Cardiac Enzymes: No results for input(s): CKTOTAL, CKMB, CKMBINDEX, TROPONINI in the last 168 hours. BNP (last 3 results) No results for input(s): PROBNP in the last 8760 hours. HbA1C: No results for input(s): HGBA1C in the last 72 hours. CBG: No results for input(s): GLUCAP in the last 168 hours. Lipid Profile: No results for input(s): CHOL, HDL, LDLCALC, TRIG, CHOLHDL, LDLDIRECT in the last 72 hours. Thyroid Function Tests: No results for input(s): TSH, T4TOTAL, FREET4, T3FREE, THYROIDAB in the last 72 hours. Anemia Panel: No results for input(s): VITAMINB12, FOLATE, FERRITIN, TIBC, IRON, RETICCTPCT  in the last 72 hours. Urine analysis:    Component Value  Date/Time   COLORURINE YELLOW (A) 12/21/2018 1212   APPEARANCEUR CLEAR (A) 12/21/2018 1212   LABSPEC 1.012 12/21/2018 1212   PHURINE 6.0 12/21/2018 1212   GLUCOSEU NEGATIVE 12/21/2018 1212   HGBUR SMALL (A) 12/21/2018 1212   BILIRUBINUR NEGATIVE 12/21/2018 1212   BILIRUBINUR neg 05/16/2016 1537   KETONESUR NEGATIVE 12/21/2018 1212   PROTEINUR NEGATIVE 12/21/2018 1212   UROBILINOGEN 0.2 05/16/2016 1537   NITRITE NEGATIVE 12/21/2018 1212   LEUKOCYTESUR NEGATIVE 12/21/2018 1212    Radiological Exams on Admission: Mr Abdomen W Wo Contrast  Result Date: 12/19/2018 CLINICAL DATA:  Abdominal pain in a patient with breast cancer. EXAM: MRI ABDOMEN WITHOUT AND WITH CONTRAST TECHNIQUE: Multiplanar multisequence MR imaging of the abdomen was performed both before and after the administration of intravenous contrast. CONTRAST:  39mL GADAVIST GADOBUTROL 1 MMOL/ML IV SOLN COMPARISON:  CT of 12/19/2018 FINDINGS: Lower chest: Limited assessment of the lower chest on MRI is unremarkable. Hepatobiliary: Diffuse infiltration of much of the liver, nearly the entire left hepatic lobe, medial section and lateral section in addition to anterior right hepatic lobe with confluent infiltrative restricted diffusion and heterogeneous enhancement measuring approximately 21 by 7.4 cm. Diffuse metastatic disease is strongly suspected. Boundaries are well-demarcated without adjacent edema in the bordering hepatic parenchyma Discrete hepatic lesions are also scattered about the right hepatic lobe largest in the dome of the right hemi liver measuring approximately 2.5 cm in greatest dimension. These discrete lesions display a targetoid appearance. There are numerous lesions in the right hepatic lobe, lesions are found in all hepatic subsegment. Pancreas: No mass, inflammatory changes, or other parenchymal abnormality identified. Mild ductal distension in the tail of the  pancreas may be due to adjacent mass effect. No visible pancreatic lesion is noted. Spleen:  Within normal limits in size and appearance. Adrenals/Urinary Tract: No masses identified. No evidence of hydronephrosis. Stomach/Bowel: Limited assessment of the gastrointestinal tract without signs of acute process. Vascular/Lymphatic: Left-sided peri-renal venous collaterals of uncertain significance perhaps related to elevated portal pressures in the setting of diffuse hepatic involvement. 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 Right portal vein is patent. Scattered small lymph nodes in the upper abdomen. There is either slow flow or thrombus within the main portal vein at the level of the splenic portal confluence extending in the splenic vein. The SMV is patent. There is mass effect upon the vessel at the level of the splenic portal confluence due to hepatic enlargement. This is best seen on subtraction images, image 54, series 20. Note that slow flow in an otherwise opacified vessel can cause artifact with a similar appearance however, this is seen on contrasted and non contrasted imaging with a similar appearance. Other:  None. Musculoskeletal: No signs of acute or destructive bone process. IMPRESSION: 1. Diffuse infiltration of much of the liver, nearly the entire left hepatic lobe, with confluent infiltrative restricted diffusion and heterogeneous enhancement measuring approximately 21 by 7.4 cm. Diffuse metastatic disease is strongly suspected with multifocal lesions in the right hepatic lobe. Superimposed infection is difficult to exclude though areas at the boundary of these lesions show no overt signs of edema. 2. Highly narrowed or occluded left portal vein with potential thrombus versus slow flow in the splenoportal confluence. This was patent on the exam of 12/19/2018; however, given above findings a venous phase CT or even ultrasound hepatic Doppler with special attention to the splenic portal confluence may be  helpful to exclude this possibility, finding is also exhibited on TrueFISP sequence (image 24,  series 10). 3. 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 4. Mild ductal distension in the tail of the pancreas, potentially due to mass effect. Attention on follow-up. 5. Critical Value/emergent results were called by telephone at the time of interpretation on 12/19/2018 at 6:43 pm to Fraser , who verbally acknowledged these results. Electronically Signed   By: Zetta Bills M.D.   On: 12/19/2018 18:24   US Abdomen Complete  Result Date: 12/19/2018 CLINICAL DATA:  Abdominal pain EXAM: ABDOMEN ULTRASOUND COMPLETE COMPARISON:  MRI earlier today FINDINGS: Gallbladder: Gallbladder is contracted with associated mild wall thickening measuring up to 4 mm. Sludge noted within the gallbladder. No visible stones. Common bile duct: Diameter: Normal caliber, 3 mm Liver: Diffusely heterogeneous echotexture throughout the liver corresponding to the abnormality seen on today's MRI suspicious forinfiltrating mass. Main portal vein is patent on color Doppler imaging with normal direction of blood flow towards the liver. The area of possible thrombus seen on MRI at the level of the splenic portal confluence not visualized. IVC: No abnormality visualized. Pancreas: Visualized portion unremarkable. Spleen: Size and appearance within normal limits. Right Kidney: Length: 11.0 cm. Echogenicity within normal limits. No mass or hydronephrosis visualized. Left Kidney: Length: 10.5 cm. Echogenicity within normal limits. No mass or hydronephrosis visualized. Abdominal aorta: No aneurysm visualized. Other findings: None. IMPRESSION: Heterogeneous appearance throughout much of the left hepatic lobe as seen on today's MRI concerning for infiltrating mass/tumor. Main portal vein is patent. Contracted gallbladder with gallbladder wall  thickening likely related to contracted state. Sludge within the gallbladder. No sonographic Murphy sign or visible stones. Electronically Signed   By: Rolm Baptise M.D.   On: 12/19/2018 20:35   US Biopsy (liver)  Result Date: 12/20/2018 INDICATION: History of left breast carcinoma with imaging evidence of probable diffuse metastatic disease in the liver, more prominently in the left lobe. The patient presents for biopsy. EXAM: ULTRASOUND GUIDED CORE BIOPSY OF LIVER MEDICATIONS: None. ANESTHESIA/SEDATION: Fentanyl 100 mcg IV; Versed 2.0 mg IV Moderate Sedation Time:  20 minutes. The patient was continuously monitored during the procedure by the interventional radiology nurse under my direct supervision. PROCEDURE: The procedure, risks, benefits, and alternatives were explained to the patient. Questions regarding the procedure were encouraged and answered. The patient understands and consents to the procedure. A time-out was performed prior to initiating the procedure. Ultrasound was used to localize lesions within the liver. The anterior abdominal wall was prepped with chlorhexidine in a sterile fashion, and a sterile drape was applied covering the operative field. A sterile gown and sterile gloves were used for the procedure. Local anesthesia was provided with 1% Lidocaine. Under direct ultrasound guidance, a 17 gauge trocar needle was advanced into the left lobe of the liver. After confirming needle tip position, coaxial 18 gauge core biopsy samples were obtained. A total of 3 samples were obtained and submitted in formalin. A slurry of Gel-Foam pledgets was then slowly injected via the outer needle as the needle was retracted. Additional ultrasound was performed. COMPLICATIONS: None immediate. FINDINGS: Ultrasound demonstrates heterogeneous appearance of the liver with ill-defined lesions throughout the left lobe. Solid core biopsy samples were obtained by sampling lesions within the lateral segment of the  left lobe. IMPRESSION: Ultrasound-guided core biopsy performed of hepatic lesions within the left lobe. Electronically Signed   By: Aletta Edouard M.D.   On: 12/20/2018 13:44   Dg Chest Portable 1 View  Result Date: 12/21/2018 CLINICAL DATA:  Pleuritic right chest pain EXAM: PORTABLE CHEST 1 VIEW COMPARISON:  12/18/2018 chest radiograph. FINDINGS: Stable right subclavian Port-A-Cath terminating over the right atrium. Stable cardiomediastinal silhouette with normal heart size. No pneumothorax. No pleural effusion. No pulmonary edema. Curvilinear left lung base opacities. No acute consolidative airspace disease. IMPRESSION: Curvilinear left lung base opacities, favor scarring or atelectasis. No acute consolidative airspace disease. Electronically Signed   By: Ilona Sorrel M.D.   On: 12/21/2018 13:06    EKG: Independently reviewed. Normal sinus rhythm at 100.  Assessment/Plan Principal Problem:   Abdominal pain, acute, right upper quadrant Active Problems:   Hypertension   Transaminitis   Liver mass  Acute abdominal pain/right upper quadrant pain:  Attribute patient's right upper quadrant pain to her recent ultrasound-guided biopsy.  Risks of infection and hematoma or high on our differential list for this patient.  We will get an abdominal ultrasound, obtain blood cultures, pain control with as needed Dilaudid.  IV fluids as patient is dehydrated.  Suspect liver masses to be metastatic breast cancer.  Hypertension: Blood pressure is a little bit high due to pain. We will continue patient on losartan 50 mg daily. Cardiac diet.  Pain control.  Transaminitis/liver mass: Attribute to metastatic breast cancer. Management per oncology, consider oncology consult.   As needed Dilaudid for pain.   DVT prophylaxis: SCD's  Code Status: Full code  Family Communication: Gladys Damme [phone] Disposition Plan: D/c home in 48 3-4 days.  Consults called: None  Admission status: Inpatient.     Para Skeans MD [facility] If 7PM-7AM, please contact night-coverage [email or web address] Password [email or web address]  12/21/2018, 3:50 PM

## 2018-12-21 NOTE — ED Provider Notes (Addendum)
[facility] Emergency Department Provider Note   ____________________________________________   First MD Initiated Contact with Patient 12/21/18 1356     (approximate)  I have reviewed the triage vital signs and the nursing notes.   HISTORY  Chief Complaint Abdominal Pain    HPI Shelby [name] is a 48 y.o. female discharged yesterday for concerns of abdominal pain with a work-up regarding her liver as well as she is meeting SIRS criteria with fever  Patient comes back today reports she continues to have significant pain and discomfort.  She has been having ongoing and severe pain located in the mid to right upper abdomen, also she reports fairly significant pain when she takes a deep breath over the right lower lung  She has been feeling warm had a fever when she was in the hospital yesterday.  Some nausea.  No headaches.  Tested negative for Covid a few days ago pain is located primarily in the right upper abdomen under the right rib cage.   Past Medical History:  Diagnosis Date  . Anemia    h/o with pregnancy  . Anxiety   . Asthma    allergy induced-no inhaler  . Cancer [facility])    breast left   . Complication of anesthesia   . Environmental allergies   . Family history of adverse reaction to anesthesia    son-breathing problems-coded 1st time when he was 2 and had another surgery at 39 and had to be admitted for breathing problems  . Family history of breast cancer   . GERD (gastroesophageal reflux disease)   . Headache    migraines  . Hypertension   . Mitral valve disease   . Mitral valve prolapse   . Painful menstrual periods   . PONV (postoperative nausea and vomiting)   . Vertigo     Patient Active Problem List   Diagnosis Date Noted  . GERD (gastroesophageal reflux disease)   . Anemia   . Liver mass   . Transaminitis   . SIRS (systemic inflammatory response syndrome) (HCC)   . Abdominal pain, acute, right upper quadrant  12/18/2018  . Pancytopenia ([place]) 12/18/2018  . Drug-induced neutropenia ([place]) 03/21/2018  . Malignant neoplasm of female breast ([place]) 12/23/2017  . Bronchitis 03/10/2015  . Palpitations 02/15/2015  . Anxiety 01/20/2015  . Clinical depression 01/20/2015  . Diverticulitis 01/20/2015  . Hypertension 01/20/2015  . Adaptive colitis 01/20/2015  . Cannot sleep 01/20/2015  . Headache, variant migraine 01/20/2015  . Disorder of mitral valve 01/20/2015  . Herpes zona 01/20/2015  . Fibroid 05/07/2007  . Allergic rhinitis 10/29/2006    Past Surgical History:  Procedure Laterality Date  . ABDOMINAL HYSTERECTOMY  2010   supracervical   . AXILLARY LYMPH NODE DISSECTION Left 07/12/2018   Procedure: AXILLARY LYMPH NODE DISSECTION;  Surgeon: Robert Bellow, MD;  Location: [facility] ORS;  Service: General;  Laterality: Left;  . BREAST BIOPSY Left 11/2017   invasive ductal carcinoma and metastatic LN  . BREAST BIOPSY WITH SENTINEL LYMPH NODE BIOPSY AND NEEDLE LOCALIZATION Left 07/12/2018   Procedure: BREAST BIOPSY WIDE EXCISION WITH SENTINEL NODE  AND NEEDLE LOCALIZATION OF OXILLARY NODS LEFT;  Surgeon: Robert Bellow, MD;  Location: [facility] ORS;  Service: General;  Laterality: Left;  . BUNIONECTOMY Bilateral   . MANDIBLE RECONSTRUCTION     age 12-underbite  . PORTACATH PLACEMENT Right 12/31/2017   Procedure: INSERTION PORT-A-CATH;  Surgeon: Robert Bellow, MD;  Location: [facility] ORS;  Service:  General;  Laterality: Right;  . RE-EXCISION OF BREAST LUMPECTOMY Left 07/24/2018   Procedure: RE-EXCISION OF BREAST LUMPECTOMY LEFT;  Surgeon: Robert Bellow, MD;  Location: [facility] ORS;  Service: General;  Laterality: Left;  . SHOULDER ARTHROSCOPY WITH OPEN ROTATOR CUFF REPAIR Right 04/26/2017   Procedure: SHOULDER ARTHROSCOPY WITH OPEN ROTATOR CUFF REPAIR,SUBACROMINAL DECOMPRESSION;  Surgeon: Thornton Park, MD;  Location: [facility] ORS;  Service: Orthopedics;  Laterality: Right;  . TONSILLECTOMY      Prior  to Admission medications   Medication Sig Start Date End Date Taking? Authorizing Provider  capecitabine (XELODA) 500 MG tablet Take 4 tablets (2,000 mg total) by mouth 2 (two) times daily after a meal. Take for 14 days, then hold for 7 days. Repeat every 21 days. 10/10/18  Yes Lloyd Huger, MD  Cholecalciferol (VITAMIN D) 50 MCG (2000 UT) CAPS Take 2,000 Units by mouth every evening.    Yes [provider]  famotidine (PEPCID) 20 MG tablet Take 20 mg by mouth daily as needed for heartburn.    Yes [provider]  ibuprofen (ADVIL,MOTRIN) 200 MG tablet Take 200 mg by mouth daily as needed for mild pain.    Yes [provider]  losartan (COZAAR) 50 MG tablet TAKE 1 TABLET BY MOUTH DAILY Patient taking differently: Take 50 mg by mouth daily.  11/16/18  Yes Jerrol Banana., MD  mometasone (NASONEX) 50 MCG/ACT nasal spray Place 2 sprays into the nose daily. Patient taking differently: Place 2 sprays into the nose daily as needed (allergies).  04/18/18  Yes Finnegan, [name], MD  montelukast (SINGULAIR) 10 MG tablet TAKE 1 TABLET BY MOUTH AT BEDTIME Patient taking differently: Take 10 mg by mouth daily as needed (allergies).  04/17/18  Yes Jerrol Banana., MD  Multiple Vitamin (MULTIVITAMIN WITH MINERALS) TABS tablet Take 1 tablet by mouth daily. 12/21/18  Yes Fritzi Mandes, MD  Omega-3 1000 MG CAPS Take 1,000 mg by mouth every evening.    Yes [provider]  omeprazole (PRILOSEC) 20 MG capsule Take 1 capsule (20 mg total) by mouth daily. 12/17/18  Yes Jerrol Banana., MD  sucralfate (CARAFATE) 1 g tablet Take 1 tablet (1 g total) by mouth 4 (four) times daily -  with meals and at bedtime. 12/17/18  Yes Jerrol Banana., MD  traMADol (ULTRAM) 50 MG tablet Take 1 tablet (50 mg total) by mouth every 12 (twelve) hours as needed for severe pain. 12/20/18  Yes Fritzi Mandes, MD  ALPRAZolam Duanne Moron) 0.5 MG tablet Take 0.5-1 tablets (0.25-0.5 mg  total) by mouth 2 (two) times daily as needed for anxiety. Patient not taking: Reported on 11/22/2018 06/11/18   Verlon Au, NP  prochlorperazine (COMPAZINE) 10 MG tablet Take 1 tablet (10 mg total) by mouth every 6 (six) hours as needed (Nausea or vomiting). 12/24/17 10/09/18  Lloyd Huger, MD    Allergies Hydrocodone, Iodine, Peanut butter flavor, Shellfish allergy, and Vancomycin  Family History  Problem Relation Age of Onset  . Breast cancer Mother 33  . Diabetes Father   . Cancer Maternal Uncle        colon  . Breast cancer Maternal Grandmother 70    Social History Social History   Tobacco Use  . Smoking status: Former Smoker    Packs/day: 0.50    Years: 10.00    Pack years: 5.00    Types: Cigarettes    Quit date: 04/20/2007    Years since quitting:  11.6  . Smokeless tobacco: Never Used  . Tobacco comment: social smoker back then  Substance Use Topics  . Alcohol use: Yes    Comment: occas  . Drug use: No    Review of Systems Constitutional: See HPI Eyes: No visual changes. ENT: No sore throat. Cardiovascular: Denies chest pain. Respiratory: Feels a little short of breath or noticed her oxygen levels a little low.  Pain in the right lower lung when she takes a deep breath Gastrointestinal: See HPI genitourinary: Negative for dysuria. Musculoskeletal: Negative for back pain. Skin: Negative for rash. Neurological: Negative for headaches, areas of focal weakness or numbness.    ____________________________________________   PHYSICAL EXAM:  VITAL SIGNS: ED Triage Vitals  Enc Vitals Group     BP 12/21/18 1003 (!) 148/91     Pulse Rate 12/21/18 1003 (!) 118     Resp 12/21/18 1003 18     Temp 12/21/18 1003 99.5 F (37.5 C)     Temp Source 12/21/18 1003 Oral     SpO2 12/21/18 1003 93 %     Weight 12/21/18 0958 143 lb (64.9 kg)     Height 12/21/18 0958 5\' 6"  (1.676 m)     Head Circumference --      Peak Flow --      Pain Score 12/21/18 0958 3      Pain Loc --      Pain Edu? --      Excl. in [place]? --     Constitutional: Alert and oriented.  Peers somewhat uncomfortable, pain with deep inspiration eyes: Conjunctivae are normal. Head: Atraumatic. Nose: No congestion/rhinnorhea. Mouth/Throat: Mucous membranes are moist. Neck: No stridor.  Cardiovascular: Normal rate, regular rhythm. Grossly normal heart sounds.  Good peripheral circulation. Respiratory: Normal respiratory effort.  No retractions. Lungs CTAB.  Splints in the right lower lung right lower chest when she takes a deep breath Gastrointestinal: Soft and modest tenderness the right upper quadrant, biopsy site clean dry intact no surrounding hematoma. No distention. Musculoskeletal: No lower extremity tenderness nor edema. Neurologic:  Normal speech and language. No gross focal neurologic deficits are appreciated.  Skin:  Skin is warm, dry and intact. No rash noted. Psychiatric: Mood and affect are normal. Speech and behavior are normal.  ____________________________________________   LABS (all labs ordered are listed, but only abnormal results are displayed)  Labs Reviewed  COMPREHENSIVE METABOLIC PANEL - Abnormal; Notable for the following components:      Result Value   Sodium 129 (*)    Chloride 91 (*)    Glucose, Bld 139 (*)    Calcium 8.6 (*)    AST 269 (*)    ALT 158 (*)    All other components within normal limits  CBC - Abnormal; Notable for the following components:   RBC 3.61 (*)    Hemoglobin 11.8 (*)    HCT 33.2 (*)    RDW 15.9 (*)    Platelets 62 (*)    All other components within normal limits  URINALYSIS, COMPLETE (UACMP) WITH MICROSCOPIC - Abnormal; Notable for the following components:   Color, Urine YELLOW (*)    APPearance CLEAR (*)    Hgb urine dipstick SMALL (*)    Bacteria, UA RARE (*)    All other components within normal limits  LACTIC ACID, PLASMA - Abnormal; Notable for the following components:   Lactic Acid, Venous 2.7 (*)    All  other components within normal limits  LACTIC ACID, PLASMA -  Abnormal; Notable for the following components:   Lactic Acid, Venous 2.2 (*)    All other components within normal limits  SARS CORONAVIRUS 2 (TAT 6-24 HRS)  CULTURE, BLOOD (ROUTINE X 2)  CULTURE, BLOOD (ROUTINE X 2)  LIPASE, BLOOD   ____________________________________________  EKG  Reviewed and interpreted by me at 1630 HR 95 QRS 80 QTc 450 Normal sinus, no acute ischemia ____________________________________________  RADIOLOGY  Dg Chest Portable 1 View  Result Date: 12/21/2018 CLINICAL DATA:  Pleuritic right chest pain EXAM: PORTABLE CHEST 1 VIEW COMPARISON:  12/18/2018 chest radiograph. FINDINGS: Stable right subclavian Port-A-Cath terminating over the right atrium. Stable cardiomediastinal silhouette with normal heart size. No pneumothorax. No pleural effusion. No pulmonary edema. Curvilinear left lung base opacities. No acute consolidative airspace disease. IMPRESSION: Curvilinear left lung base opacities, favor scarring or atelectasis. No acute consolidative airspace disease. Electronically Signed   By: Ilona Sorrel M.D.   On: 12/21/2018 13:06     CT abdomen pelvis is pending, patient mid to the hospital service with this test pending as well as a pulmonary embolism study as the patient required pretreatment due to contrast allergy ____________________________________________   PROCEDURES  Procedure(s) performed: None  Procedures  Critical Care performed: No  ____________________________________________   INITIAL IMPRESSION / ASSESSMENT AND PLAN / ED COURSE  Pertinent labs & imaging results that were available during my care of the patient were reviewed by me and considered in my medical decision making (see chart for details).   Patient presents for ongoing abdominal pain, now after needle biopsy.  She reports a right-sided significant pleuritic component and is again febrile.  Etiology of the  symptoms is not quite clear to me, her chest x-ray does not support pneumothorax or an obvious acute pulmonary process.  Hemoglobin stable.  She is hemodynamically stable, but I think clearly needs pain control and further work-up especially given her ongoing febrile status.  Thus far her blood cultures have been negative.  Discussed with the patient, will admit her once again for pain control and further evaluation as she is clearly not doing well after leaving the hospital and continued to have fevers and significant pain.  Clinical Course as of Dec 20 1453  Sat Dec 21, 2018  1237 Oral temp 100.4 on my check now   [MQ]    Clinical Course User Index [MQ] Delman Kitten, MD   Further work-up to include CT scan to evaluate and exclude PE as well as abdomen pelvis to further evaluate for pathology, biopsy site hematoma, complication etc.  Patient in agreement with this plan.  Discussed with hospitalist Dr. Posey Pronto.    ____________________________________________   FINAL CLINICAL IMPRESSION(S) / ED DIAGNOSES  Final diagnoses:  Right upper quadrant abdominal pain  Fever, unspecified fever cause  Pleuritic chest pain        Note:  This document was prepared using Dragon voice recognition software and may include unintentional dictation errors       Delman Kitten, MD 12/21/18 1553    Delman Kitten, MD 12/30/18 1011

## 2018-12-21 NOTE — ED Triage Notes (Addendum)
C/o abd pain with nausea. Reports having liver biopsy yesterday and then sent home. Patient is back today for pain control. Reports pain is worse than childbirth. Was sent home yesterday with tramadol. Chemo patient for left sided breast cancer. Patient has been taking chemo pills -last pill about two weeks ago

## 2018-12-21 NOTE — ED Notes (Signed)
Patient transported to CT 

## 2018-12-21 NOTE — ED Notes (Signed)
ED TO INPATIENT HANDOFF REPORT  ED Nurse Name and Phone #: Lennix Rotundo [phone]   S Name/Age/Gender Shelby [name] 48 y.o. female Room/Bed: ED09A/ED09A  Code Status   Code Status: Full Code  Home/SNF/Other Home Patient oriented to: self, place, time and situation Is this baseline? Yes   Triage Complete: Triage complete  Chief Complaint abd pain  Triage Note C/o abd pain with nausea. Reports having liver biopsy yesterday and then sent home. Patient is back today for pain control. Reports pain is worse than childbirth. Was sent home yesterday with tramadol. Chemo patient for left sided breast cancer. Patient has been taking chemo pills -last pill about two weeks ago   Allergies Allergies  Allergen Reactions  . Hydrocodone Hives    Swollen face - many years ago. Thinks she has tolerated since.  . Iodine Hives  . Peanut Butter Flavor Other (See Comments)    Made mouth tingle  . Shellfish Allergy Hives  . Vancomycin Other (See Comments)    Itching and forehead turned red    Level of Care/Admitting Diagnosis ED Disposition    ED Disposition Condition Comment   Admit  Hospital Area: [place] [[id number]]  Level of Care: Med-Surg [16]  Covid Evaluation: Asymptomatic Screening Protocol (No Symptoms)  Diagnosis: Abdominal pain, acute, right upper quadrant [id number]  Admitting Physician: Cherylann Ratel  Attending Physician: Cherylann Ratel  Estimated length of stay: 3 - 4 days  Certification:: I certify this patient will need inpatient services for at least 2 midnights  PT Class (Do Not Modify): Inpatient [101]  PT Acc Code (Do Not Modify): Private [1]       B Medical/Surgery History Past Medical History:  Diagnosis Date  . Anemia    h/o with pregnancy  . Anxiety   . Asthma    allergy induced-no inhaler  . [facility])    breast left   . Complication of anesthesia   . Environmental allergies   . Family history of adverse reaction  to anesthesia    son-breathing problems-coded 1st time when he was 2 and had another surgery at 60 and had to be admitted for breathing problems  . Family history of breast cancer   . GERD (gastroesophageal reflux disease)   . Headache    migraines  . Hypertension   . Mitral valve disease   . Mitral valve prolapse   . Painful menstrual periods   . PONV (postoperative nausea and vomiting)   . Vertigo    Past Surgical History:  Procedure Laterality Date  . ABDOMINAL HYSTERECTOMY  2010   supracervical   . AXILLARY LYMPH NODE DISSECTION Left 07/12/2018   Procedure: AXILLARY LYMPH NODE DISSECTION;  Surgeon: Robert Bellow, MD;  Location: [facility] ORS;  Service: General;  Laterality: Left;  . BREAST BIOPSY Left 11/2017   invasive ductal carcinoma and metastatic LN  . BREAST BIOPSY WITH SENTINEL LYMPH NODE BIOPSY AND NEEDLE LOCALIZATION Left 07/12/2018   Procedure: BREAST BIOPSY WIDE EXCISION WITH SENTINEL NODE  AND NEEDLE LOCALIZATION OF OXILLARY NODS LEFT;  Surgeon: Robert Bellow, MD;  Location: [facility] ORS;  Service: General;  Laterality: Left;  . BUNIONECTOMY Bilateral   . MANDIBLE RECONSTRUCTION     age 89-underbite  . PORTACATH PLACEMENT Right 12/31/2017   Procedure: INSERTION PORT-A-CATH;  Surgeon: Robert Bellow, MD;  Location: [facility] ORS;  Service: General;  Laterality: Right;  . RE-EXCISION OF BREAST LUMPECTOMY Left 07/24/2018   Procedure: RE-EXCISION OF BREAST  LUMPECTOMY LEFT;  Surgeon: Robert Bellow, MD;  Location: [facility] ORS;  Service: General;  Laterality: Left;  . SHOULDER ARTHROSCOPY WITH OPEN ROTATOR CUFF REPAIR Right 04/26/2017   Procedure: SHOULDER ARTHROSCOPY WITH OPEN ROTATOR CUFF REPAIR,SUBACROMINAL DECOMPRESSION;  Surgeon: Thornton Park, MD;  Location: [facility] ORS;  Service: Orthopedics;  Laterality: Right;  . TONSILLECTOMY       A IV Location/Drains/Wounds Patient Lines/Drains/Airways Status   Active Line/Drains/Airways    Name:   Placement date:   Placement  time:   Site:   Days:   Peripheral IV 12/21/18 Right Antecubital   12/21/18    1011    Antecubital   less than 1   Peripheral IV 12/21/18 Right Wrist   12/21/18    1733    Wrist   less than 1   Closed System Drain 1 Left Breast Bulb (JP) 15 Fr.   07/12/18    1234    Breast   162   Incision (Closed) 07/12/18 Axilla Left   07/12/18    1215     162   Incision (Closed) 07/12/18 Breast Left   07/12/18    1218     162   Incision (Closed) 07/24/18 Breast Left   07/24/18    1047     150          Intake/Output Last 24 hours No intake or output data in the 24 hours ending 12/21/18 1912  Labs/Imaging Results for orders placed or performed during the hospital encounter of 12/21/18 (from the past 48 hour(s))  Lipase, blood     Status: None   Collection Time: 12/21/18 10:09 AM  Result Value Ref Range   Lipase 27 11 - 51 U/L    Comment: Performed at [facility], [address] [place], [place] [address]  Comprehensive metabolic panel     Status: Abnormal   Collection Time: 12/21/18 10:09 AM  Result Value Ref Range   Sodium 129 (L) 135 - 145 mmol/L   Potassium 4.0 3.5 - 5.1 mmol/L   Chloride 91 (L) 98 - 111 mmol/L   CO2 26 22 - 32 mmol/L   Glucose, Bld 139 (H) 70 - 99 mg/dL   BUN 12 6 - 20 mg/dL   Creatinine, Ser 0.72 0.44 - 1.00 mg/dL   Calcium 8.6 (L) 8.9 - 10.3 mg/dL   Total Protein 6.9 6.5 - 8.1 g/dL   Albumin 3.5 3.5 - 5.0 g/dL   AST 269 (H) 15 - 41 U/L   ALT 158 (H) 0 - 44 U/L   Alkaline Phosphatase 116 38 - 126 U/L   Total Bilirubin 1.1 0.3 - 1.2 mg/dL   GFR calc non Af Amer >60 >60 mL/min   GFR calc Af Amer >60 >60 mL/min   Anion gap 12 5 - 15    Comment: Performed at [facility], [address] [place], [place] [address]  CBC     Status: Abnormal   Collection Time: 12/21/18 10:09 AM  Result Value Ref Range   WBC 6.9 4.0 - 10.5 K/uL   RBC 3.61 (L) 3.87 - 5.11 MIL/uL   Hemoglobin 11.8 (L) 12.0 - 15.0 g/dL   HCT 33.2 (L) 36.0 - 46.0 %   MCV 92.0 80.0 -  100.0 fL   MCH 32.7 26.0 - 34.0 pg   MCHC 35.5 30.0 - 36.0 g/dL   RDW 15.9 (H) 11.5 - 15.5 %   Platelets 62 (L) 150 - 400 K/uL  Comment: Immature Platelet Fraction may be clinically indicated, consider ordering this additional test [id number]    nRBC 0.0 0.0 - 0.2 %    Comment: Performed at [facility], [address] [place], [place] [address]  Lactic acid, plasma     Status: Abnormal   Collection Time: 12/21/18 10:09 AM  Result Value Ref Range   Lactic Acid, Venous 2.7 (HH) 0.5 - 1.9 mmol/L    Comment: CRITICAL RESULT CALLED TO, READ BACK BY AND VERIFIED WITH ANGELA ROBIN@1055  ON 12/21/18 BY HKP Performed at [facility], [address] [place], [place] [address]   Urinalysis, Complete w Microscopic     Status: Abnormal   Collection Time: 12/21/18 12:12 PM  Result Value Ref Range   Color, Urine YELLOW (A) YELLOW   APPearance CLEAR (A) CLEAR   Specific Gravity, Urine 1.012 1.005 - 1.030   pH 6.0 5.0 - 8.0   Glucose, UA NEGATIVE NEGATIVE mg/dL   Hgb urine dipstick SMALL (A) NEGATIVE   Bilirubin Urine NEGATIVE NEGATIVE   Ketones, ur NEGATIVE NEGATIVE mg/dL   Protein, ur NEGATIVE NEGATIVE mg/dL   Nitrite NEGATIVE NEGATIVE   Leukocytes,Ua NEGATIVE NEGATIVE   RBC / HPF 0-5 0 - 5 RBC/hpf   WBC, UA 0-5 0 - 5 WBC/hpf   Bacteria, UA RARE (A) NONE SEEN   Squamous Epithelial / LPF 0-5 0 - 5   Mucus PRESENT    Hyaline Casts, UA PRESENT     Comment: Performed at [facility], [address] [place], [place] [address]  Lactic acid, plasma     Status: Abnormal   Collection Time: 12/21/18 12:12 PM  Result Value Ref Range   Lactic Acid, Venous 2.2 (HH) 0.5 - 1.9 mmol/L    Comment: CRITICAL RESULT CALLED TO, READ BACK BY AND VERIFIED WITH KIM MAIN@1252  ON 12/21/18 BY HKP Performed at [facility], [address] [place], [place] [address]    Ct Angio Chest Pe W And/or Wo Contrast  Result Date: 12/21/2018 CLINICAL DATA:  48 y.o. female  discharged yesterday for concerns of abdominal pain with a work-up regarding her liver as well as she is meeting SIRS criteria with feverPatient comes back today reports she continues to have significant pain and discomfort. She has been having ongoing and severe pain located in the mid to right upper abdomen, also she reports fairly significant pain when she takes a deep breath over the right lower lungShe has been feeling warm had a fever when she was in the hospital yesterday. Some nausea. No headaches. Tested negative for Covid a few days ago pain is located primarily in the right upper abdomen under the right rib cage. EXAM: CT ANGIOGRAPHY CHEST CT ABDOMEN AND PELVIS WITH CONTRAST TECHNIQUE: Multidetector CT imaging of the chest was performed using the standard protocol during bolus administration of intravenous contrast. Multiplanar CT image reconstructions and MIPs were obtained to evaluate the vascular anatomy. Multidetector CT imaging of the abdomen and pelvis was performed using the standard protocol during bolus administration of intravenous contrast. CONTRAST:  54mL OMNIPAQUE IOHEXOL 350 MG/ML SOLN COMPARISON:  Chest CTA and abdomen and pelvis CT dated 12/19/2018. FINDINGS: CTA CHEST FINDINGS Cardiovascular: There is satisfactory opacification of the pulmonary arteries to the segmental level. No evidence of a pulmonary embolism. Mediastinum/Nodes: No enlarged mediastinal, hilar, or axillary lymph nodes. Thyroid gland, trachea, and esophagus demonstrate no significant findings. Lungs/Pleura: Small bilateral pleural effusions. There is dependent opacity in the lower lobes consistent with atelectasis.  Small areas of ground-glass opacity and small lung nodules noted. Dominant nodule in the right upper lobe, image 46, series 4, 6 mm. Several other small nodules are new from the prior CT. Area of ground-glass opacity adjacent to the oblique fissure in the left upper lobe is new. No pneumothorax.  Musculoskeletal: No chest wall abnormality. No acute or significant osseous findings. Review of the MIP images confirms the above findings. CT ABDOMEN and PELVIS FINDINGS Hepatobiliary: Enlarged liver. There are multiple hypoattenuating areas throughout the liver. These areas are more apparent and more extensive than on the recent prior CT, particularly evident at the dome of segment 4A, along the anterior aspect of the right lobe and medial segment of the left lobe and along the inferior aspect of the right lobe, segment 5 and 6. Gallbladder is mostly collapsed. There is increased attenuation material within the gallbladder similar to the prior CT. No bile duct dilation Pancreas: Unremarkable. No pancreatic ductal dilatation or surrounding inflammatory changes. Spleen: Normal in size without focal abnormality. Adrenals/Urinary Tract: No adrenal masses. Right kidney displaced inferiorly by the enlarged liver. Symmetric renal enhancement and excretion. No renal masses, stones or hydronephrosis. Normal ureters. Bladder wall is prominent, unchanged from the recent prior study. No bladder masses. Stomach/Bowel: Stomach is unremarkable, mostly decompressed. Small bowel and colon are normal in caliber. No wall thickening or inflammation. Appendix not discretely seen. No findings of appendicitis. Vascular/Lymphatic: As noted on the prior CT, left portal vein is attenuated, but does show enhancement. No evidence of thrombosis. There are venous collaterals in the upper abdomen. Aorta is unremarkable. No enlarged lymph nodes. Prominent Peri celiac node noted on the prior study is stable. Reproductive: Status post hysterectomy. No adnexal masses. Other: Small amount of ascites increased when compared to the prior CT. Musculoskeletal: No acute or significant osseous findings. Review of the MIP images confirms the above findings. IMPRESSION: CHEST CTA 1. No evidence of a pulmonary embolism. 2. Small pleural effusions, new from  the prior CT. There is increased opacity in the lower lobes that is consistent with atelectasis. There several new small nodules and small areas of ground-glass opacity when compared to the prior CT. Suspect these are inflammatory given change from the recent exam. ABDOMEN AND PELVIS CT 1. Multiple hypoattenuating liver lesions. These appear to have progressed when compared to the prior CT, although this change could be due to differences in contrast timing. The liver is enlarged, stable from the recent exam. Findings are concerning for multifocal hepatocellular carcinoma. Follow-up liver MRI without and with contrast, when the patient can tolerate the procedure, is recommended. 2. Small amount ascites has increased in quantity from the prior study. 3. There are vascular collaterals in the upper abdomen, stable. Electronically Signed   By: Lajean Manes M.D.   On: 12/21/2018 18:36   US Abdomen Complete  Result Date: 12/19/2018 CLINICAL DATA:  Abdominal pain EXAM: ABDOMEN ULTRASOUND COMPLETE COMPARISON:  MRI earlier today FINDINGS: Gallbladder: Gallbladder is contracted with associated mild wall thickening measuring up to 4 mm. Sludge noted within the gallbladder. No visible stones. Common bile duct: Diameter: Normal caliber, 3 mm Liver: Diffusely heterogeneous echotexture throughout the liver corresponding to the abnormality seen on today's MRI suspicious forinfiltrating mass. Main portal vein is patent on color Doppler imaging with normal direction of blood flow towards the liver. The area of possible thrombus seen on MRI at the level of the splenic portal confluence not visualized. IVC: No abnormality visualized. Pancreas: Visualized portion unremarkable. Spleen: Size  and appearance within normal limits. Right Kidney: Length: 11.0 cm. Echogenicity within normal limits. No mass or hydronephrosis visualized. Left Kidney: Length: 10.5 cm. Echogenicity within normal limits. No mass or hydronephrosis visualized.  Abdominal aorta: No aneurysm visualized. Other findings: None. IMPRESSION: Heterogeneous appearance throughout much of the left hepatic lobe as seen on today's MRI concerning for infiltrating mass/tumor. Main portal vein is patent. Contracted gallbladder with gallbladder wall thickening likely related to contracted state. Sludge within the gallbladder. No sonographic Murphy sign or visible stones. Electronically Signed   By: Rolm Baptise M.D.   On: 12/19/2018 20:35   Ct Abdomen Pelvis W Contrast  Result Date: 12/21/2018 CLINICAL DATA:  48 y.o. female discharged yesterday for concerns of abdominal pain with a work-up regarding her liver as well as she is meeting SIRS criteria with feverPatient comes back today reports she continues to have significant pain and discomfort. She has been having ongoing and severe pain located in the mid to right upper abdomen, also she reports fairly significant pain when she takes a deep breath over the right lower lungShe has been feeling warm had a fever when she was in the hospital yesterday. Some nausea. No headaches. Tested negative for Covid a few days ago pain is located primarily in the right upper abdomen under the right rib cage. EXAM: CT ANGIOGRAPHY CHEST CT ABDOMEN AND PELVIS WITH CONTRAST TECHNIQUE: Multidetector CT imaging of the chest was performed using the standard protocol during bolus administration of intravenous contrast. Multiplanar CT image reconstructions and MIPs were obtained to evaluate the vascular anatomy. Multidetector CT imaging of the abdomen and pelvis was performed using the standard protocol during bolus administration of intravenous contrast. CONTRAST:  28mL OMNIPAQUE IOHEXOL 350 MG/ML SOLN COMPARISON:  Chest CTA and abdomen and pelvis CT dated 12/19/2018. FINDINGS: CTA CHEST FINDINGS Cardiovascular: There is satisfactory opacification of the pulmonary arteries to the segmental level. No evidence of a pulmonary embolism. Mediastinum/Nodes: No  enlarged mediastinal, hilar, or axillary lymph nodes. Thyroid gland, trachea, and esophagus demonstrate no significant findings. Lungs/Pleura: Small bilateral pleural effusions. There is dependent opacity in the lower lobes consistent with atelectasis. Small areas of ground-glass opacity and small lung nodules noted. Dominant nodule in the right upper lobe, image 46, series 4, 6 mm. Several other small nodules are new from the prior CT. Area of ground-glass opacity adjacent to the oblique fissure in the left upper lobe is new. No pneumothorax. Musculoskeletal: No chest wall abnormality. No acute or significant osseous findings. Review of the MIP images confirms the above findings. CT ABDOMEN and PELVIS FINDINGS Hepatobiliary: Enlarged liver. There are multiple hypoattenuating areas throughout the liver. These areas are more apparent and more extensive than on the recent prior CT, particularly evident at the dome of segment 4A, along the anterior aspect of the right lobe and medial segment of the left lobe and along the inferior aspect of the right lobe, segment 5 and 6. Gallbladder is mostly collapsed. There is increased attenuation material within the gallbladder similar to the prior CT. No bile duct dilation Pancreas: Unremarkable. No pancreatic ductal dilatation or surrounding inflammatory changes. Spleen: Normal in size without focal abnormality. Adrenals/Urinary Tract: No adrenal masses. Right kidney displaced inferiorly by the enlarged liver. Symmetric renal enhancement and excretion. No renal masses, stones or hydronephrosis. Normal ureters. Bladder wall is prominent, unchanged from the recent prior study. No bladder masses. Stomach/Bowel: Stomach is unremarkable, mostly decompressed. Small bowel and colon are normal in caliber. No wall thickening or inflammation. Appendix  not discretely seen. No findings of appendicitis. Vascular/Lymphatic: As noted on the prior CT, left portal vein is attenuated, but does  show enhancement. No evidence of thrombosis. There are venous collaterals in the upper abdomen. Aorta is unremarkable. No enlarged lymph nodes. Prominent Peri celiac node noted on the prior study is stable. Reproductive: Status post hysterectomy. No adnexal masses. Other: Small amount of ascites increased when compared to the prior CT. Musculoskeletal: No acute or significant osseous findings. Review of the MIP images confirms the above findings. IMPRESSION: CHEST CTA 1. No evidence of a pulmonary embolism. 2. Small pleural effusions, new from the prior CT. There is increased opacity in the lower lobes that is consistent with atelectasis. There several new small nodules and small areas of ground-glass opacity when compared to the prior CT. Suspect these are inflammatory given change from the recent exam. ABDOMEN AND PELVIS CT 1. Multiple hypoattenuating liver lesions. These appear to have progressed when compared to the prior CT, although this change could be due to differences in contrast timing. The liver is enlarged, stable from the recent exam. Findings are concerning for multifocal hepatocellular carcinoma. Follow-up liver MRI without and with contrast, when the patient can tolerate the procedure, is recommended. 2. Small amount ascites has increased in quantity from the prior study. 3. There are vascular collaterals in the upper abdomen, stable. Electronically Signed   By: Lajean Manes M.D.   On: 12/21/2018 18:36   US Biopsy (liver)  Result Date: 12/20/2018 INDICATION: History of left breast carcinoma with imaging evidence of probable diffuse metastatic disease in the liver, more prominently in the left lobe. The patient presents for biopsy. EXAM: ULTRASOUND GUIDED CORE BIOPSY OF LIVER MEDICATIONS: None. ANESTHESIA/SEDATION: Fentanyl 100 mcg IV; Versed 2.0 mg IV Moderate Sedation Time:  20 minutes. The patient was continuously monitored during the procedure by the interventional radiology nurse under my  direct supervision. PROCEDURE: The procedure, risks, benefits, and alternatives were explained to the patient. Questions regarding the procedure were encouraged and answered. The patient understands and consents to the procedure. A time-out was performed prior to initiating the procedure. Ultrasound was used to localize lesions within the liver. The anterior abdominal wall was prepped with chlorhexidine in a sterile fashion, and a sterile drape was applied covering the operative field. A sterile gown and sterile gloves were used for the procedure. Local anesthesia was provided with 1% Lidocaine. Under direct ultrasound guidance, a 17 gauge trocar needle was advanced into the left lobe of the liver. After confirming needle tip position, coaxial 18 gauge core biopsy samples were obtained. A total of 3 samples were obtained and submitted in formalin. A slurry of Gel-Foam pledgets was then slowly injected via the outer needle as the needle was retracted. Additional ultrasound was performed. COMPLICATIONS: None immediate. FINDINGS: Ultrasound demonstrates heterogeneous appearance of the liver with ill-defined lesions throughout the left lobe. Solid core biopsy samples were obtained by sampling lesions within the lateral segment of the left lobe. IMPRESSION: Ultrasound-guided core biopsy performed of hepatic lesions within the left lobe. Electronically Signed   By: Aletta Edouard M.D.   On: 12/20/2018 13:44   Dg Chest Portable 1 View  Result Date: 12/21/2018 CLINICAL DATA:  Pleuritic right chest pain EXAM: PORTABLE CHEST 1 VIEW COMPARISON:  12/18/2018 chest radiograph. FINDINGS: Stable right subclavian Port-A-Cath terminating over the right atrium. Stable cardiomediastinal silhouette with normal heart size. No pneumothorax. No pleural effusion. No pulmonary edema. Curvilinear left lung base opacities. No acute consolidative airspace disease.  IMPRESSION: Curvilinear left lung base opacities, favor scarring or  atelectasis. No acute consolidative airspace disease. Electronically Signed   By: Ilona Sorrel M.D.   On: 12/21/2018 13:06    Pending Labs Unresulted Labs (From admission, onward)    Start     Ordered   12/21/18 1358  CULTURE, BLOOD (ROUTINE X 2) w Reflex to ID Panel  BLOOD CULTURE X 2,   STAT     12/21/18 1357   12/21/18 1248  SARS CORONAVIRUS 2 (TAT 6-24 HRS) Nasopharyngeal Nasopharyngeal Swab  (Asymptomatic/Tier 3)  Once,   STAT    Question Answer Comment  Is this test for diagnosis or screening Screening   Symptomatic for COVID-19 as defined by CDC No   Hospitalized for COVID-19 No   Admitted to ICU for COVID-19 No   Previously tested for COVID-19 Yes   Resident in a congregate (group) care setting No   Employed in healthcare setting Yes   Pregnant No      12/21/18 1247          Vitals/Pain Today's Vitals   12/21/18 1202 12/21/18 1640 12/21/18 1700 12/21/18 1730  BP: (!) 145/98 (!) 151/96 (!) 145/101 (!) 144/94  Pulse: 95 (!) 116 76 84  Resp: 18 20 (!) 25 19  Temp:      TempSrc:      SpO2: 92% 96% 99% 96%  Weight:      Height:      PainSc:        Isolation Precautions No active isolations  Medications Medications  HYDROmorphone (DILAUDID) injection 0.5 mg (0.5 mg Intravenous Given 12/21/18 1328)  ondansetron (ZOFRAN) injection 4 mg (4 mg Intravenous Given 12/21/18 1328)  sodium chloride 0.9 % bolus 500 mL (0 mLs Intravenous Stopped 12/21/18 1453)  methylPREDNISolone sodium succinate (SOLU-MEDROL) 40 mg/mL injection 40 mg (40 mg Intravenous Given 12/21/18 1333)  diphenhydrAMINE (BENADRYL) injection 50 mg (50 mg Intravenous Given 12/21/18 1637)  lactated ringers bolus 1,000 mL (1,000 mLs Intravenous New Bag/Given 12/21/18 1650)  iohexol (OMNIPAQUE) 350 MG/ML injection 75 mL (75 mLs Intravenous Contrast Given 12/21/18 1751)    Mobility walks Low fall risk   Focused Assessments Cardiac Assessment Handoff:    No results found for: CKTOTAL, CKMB, CKMBINDEX,  TROPONINI No results found for: DDIMER Does the Patient currently have chest pain? No      R Recommendations: See Admitting Provider Note  Report given to:   Additional Notes:

## 2018-12-22 DIAGNOSIS — R1011 Right upper quadrant pain: Secondary | ICD-10-CM | POA: Diagnosis not present

## 2018-12-22 LAB — CBC
HCT: 33.3 % — ABNORMAL LOW (ref 36.0–46.0)
Hemoglobin: 11.3 g/dL — ABNORMAL LOW (ref 12.0–15.0)
MCH: 32.6 pg (ref 26.0–34.0)
MCHC: 33.9 g/dL (ref 30.0–36.0)
MCV: 96 fL (ref 80.0–100.0)
Platelets: 39 10*3/uL — ABNORMAL LOW (ref 150–400)
RBC: 3.47 MIL/uL — ABNORMAL LOW (ref 3.87–5.11)
RDW: 16.1 % — ABNORMAL HIGH (ref 11.5–15.5)
WBC: 4.6 10*3/uL (ref 4.0–10.5)
nRBC: 0 % (ref 0.0–0.2)

## 2018-12-22 LAB — BASIC METABOLIC PANEL
Anion gap: 12 (ref 5–15)
BUN: 11 mg/dL (ref 6–20)
CO2: 27 mmol/L (ref 22–32)
Calcium: 8.7 mg/dL — ABNORMAL LOW (ref 8.9–10.3)
Chloride: 94 mmol/L — ABNORMAL LOW (ref 98–111)
Creatinine, Ser: 0.45 mg/dL (ref 0.44–1.00)
GFR calc Af Amer: >60 mL/min (ref >60)
GFR calc non Af Amer: >60 mL/min (ref >60)
Glucose, Bld: 114 mg/dL — ABNORMAL HIGH (ref 70–99)
Potassium: 4.1 mmol/L (ref 3.5–5.1)
Sodium: 133 mmol/L — ABNORMAL LOW (ref 135–145)

## 2018-12-22 LAB — PROCALCITONIN: Procalcitonin: 0.75 ng/mL

## 2018-12-22 LAB — LACTIC ACID, PLASMA
Lactic Acid, Venous: 2.1 mmol/L (ref 0.5–1.9)
Lactic Acid, Venous: 3 mmol/L (ref 0.5–1.9)

## 2018-12-22 LAB — SARS CORONAVIRUS 2 (TAT 6-24 HRS): SARS Coronavirus 2: NEGATIVE

## 2018-12-22 MED ORDER — IBUPROFEN 400 MG PO TABS
400.0000 mg | ORAL_TABLET | Freq: Once | ORAL | Status: AC
  Administered 2018-12-22: 400 mg via ORAL
  Filled 2018-12-22: qty 1

## 2018-12-22 MED ORDER — OXYCODONE HCL 5 MG PO TABS
5.0000 mg | ORAL_TABLET | ORAL | Status: DC | PRN
  Administered 2018-12-22 – 2018-12-25 (×2): 5 mg via ORAL
  Filled 2018-12-22 (×3): qty 1

## 2018-12-22 MED ORDER — ENOXAPARIN SODIUM 40 MG/0.4ML ~~LOC~~ SOLN
40.0000 mg | SUBCUTANEOUS | Status: DC
  Administered 2018-12-22: 40 mg via SUBCUTANEOUS
  Filled 2018-12-22: qty 0.4

## 2018-12-22 MED ORDER — SODIUM CHLORIDE 0.9 % IV BOLUS
500.0000 mL | Freq: Once | INTRAVENOUS | Status: AC
  Administered 2018-12-22: 11:00:00 500 mL via INTRAVENOUS

## 2018-12-22 MED ORDER — SODIUM CHLORIDE 0.9 % IV BOLUS
1000.0000 mL | Freq: Once | INTRAVENOUS | Status: AC
  Administered 2018-12-22: 1000 mL via INTRAVENOUS

## 2018-12-22 MED ORDER — DEXTROSE 50 % IV SOLN
INTRAVENOUS | Status: AC
  Filled 2018-12-22: qty 50

## 2018-12-22 NOTE — Progress Notes (Signed)
PROGRESS NOTE  Shelby [name] [id number] DOB: 1970-08-09 DOA: 12/21/2018 PCP: Jerrol Banana., MD  Brief History   The patient is a 48 yr old woman who was discharged from this facility on 12/20/2018 after a stay for abdominal pain, liver mass, and cirrhosis of the liver with ascites, and SIRS. She underwent biopsy of her liver mass and was discharged.   On 12/21/2018 the patient returned to the ED with complaints of abdominal pain and subjective fevers as well as shortness of breath. She was found to have atelectasis and inflammatory changes in her right lower lobe. She has had some changes consistent with SIRS such as elevated lactic acid.   Consultants  . None  Procedures  . None  Antibiotics   Anti-infectives (From admission, onward)   None    .  Subjective  The patient states that she is having less pain, although she still is having difficulty taking a deep breath. She did have a fever of 102.4 this afternoon. She is saturating 100% on 2 liters.   Objective   Vitals:  Vitals:   12/22/18 0623 12/22/18 1315  BP: 126/86 127/85  Pulse: 82 (!) 117  Resp: 16 18  Temp: 97.7 F (36.5 C) (!) 102.4 F (39.1 C)  SpO2: 100% 100%   Exam:  Constitutional:  . The patient is awake, alert, and oriented x 3. No acute distress. Respiratory:  . No increased work of breathing. . No wheezes, rales, or rhonchi . No tactile fremitus . Positive for splinting Cardiovascular:  . Regular rate and rhythm . No murmurs, ectopy, or gallups. . No lateral PMI. No thrills. Abdomen:  . Abdomen is soft, non-tender, non-distended . No hernias, masses, or organomegaly . Normoactive bowel sounds.  Musculoskeletal:  . No cyanosis, clubbing, or edema Skin:  . No rashes, lesions, ulcers . palpation of skin: no induration or nodules Neurologic:  . CN 2-12 intact . Sensation all 4 extremities intact Psychiatric:  . Mental status o Mood, affect appropriate o Orientation to  person, place, time  . judgment and insight appear intact   I have personally reviewed the following:   Today's Data  . Vitals, CBC, BMP .  Micro Data  . Blood cultures x 2 have had no growth.  Imaging  . CTA chest: Right lower lobe atelectasis and inflammatory changes.  Scheduled Meds: . cholecalciferol  2,000 Units Oral QPM  . dextrose      . fluticasone  1 spray Each Nare Daily  . losartan  50 mg Oral Daily  . montelukast  10 mg Oral QHS  . pantoprazole  40 mg Oral Daily  . sucralfate  1 g Oral TID WC & HS   Continuous Infusions:  Principal Problem:   Abdominal pain, acute, right upper quadrant Active Problems:   Hypertension   Transaminitis   Liver mass   LOS: 1 day   A & P  Atelectasis: Present on CTA chest. Due to splinting as a result of liver biopsy and hepatomegaly and capsule expansion. The patient has been ordered to use incentive spirometry, and pain control has been adjusted to allow the patient to be comfortable enough to take deep breaths. There is evidence of some inflammatory changes in the right lung as well. This is thought to be related to the atelectasis.   Acute abdominal pain/right upper quadrant pain: Attribute patient's right upper quadrant pain to her recent ultrasound-guided biopsy and effect of hepatomegaly on liver capsule. Suspect liver masses to  be metastatic breast cancer. Pathology is pending.  Hypertension: Blood pressure is a little bit high due to pain. We will continue patient on losartan 50 mg daily. Cardiac diet.  Pain control.  Transaminitis/liver mass: Attribute to metastatic breast cancer. Management per oncology, consider oncology consult.  As needed Dilaudid for pain.  The patient is resting comfortably. No new complaints.  DVT prophylaxis: SCD's  Code Status: Full code  Family Communication: None available. Disposition Plan:  Home  Callia Swim, DO [facility] Direct contact: see [email or web address]  7PM-7AM contact  night coverage as above 12/22/2018, 1:16 PM  LOS: 1 day

## 2018-12-22 NOTE — Progress Notes (Signed)
Patient is MEWS yellow due to temperature of 100.4 and pulse 114. Patient also is experiencing shortness of breath and increased anxiety. Increased oxygen from 3 liters to 4 liters nasal cannula and will administer ibuprofen per MD order. Will continue to reassess.  Marry Guan, RN 12/22/2018 1:54 PM

## 2018-12-22 NOTE — Plan of Care (Signed)
  Problem: Education: Goal: Knowledge of General Education information will improve Description Including pain rating scale, medication(s)/side effects and non-pharmacologic comfort measures Outcome: Progressing   

## 2018-12-23 ENCOUNTER — Encounter: Payer: Self-pay | Admitting: Emergency Medicine

## 2018-12-23 ENCOUNTER — Inpatient Hospital Stay: Payer: No Typology Code available for payment source | Admitting: Oncology

## 2018-12-23 ENCOUNTER — Inpatient Hospital Stay: Payer: No Typology Code available for payment source

## 2018-12-23 ENCOUNTER — Telehealth: Payer: Self-pay | Admitting: Emergency Medicine

## 2018-12-23 ENCOUNTER — Other Ambulatory Visit: Payer: Self-pay | Admitting: Oncology

## 2018-12-23 DIAGNOSIS — Z885 Allergy status to narcotic agent status: Secondary | ICD-10-CM

## 2018-12-23 DIAGNOSIS — R1011 Right upper quadrant pain: Secondary | ICD-10-CM | POA: Diagnosis not present

## 2018-12-23 DIAGNOSIS — D649 Anemia, unspecified: Secondary | ICD-10-CM

## 2018-12-23 DIAGNOSIS — C787 Secondary malignant neoplasm of liver and intrahepatic bile duct: Secondary | ICD-10-CM

## 2018-12-23 DIAGNOSIS — Z9221 Personal history of antineoplastic chemotherapy: Secondary | ICD-10-CM

## 2018-12-23 DIAGNOSIS — R1013 Epigastric pain: Secondary | ICD-10-CM | POA: Diagnosis not present

## 2018-12-23 DIAGNOSIS — C50912 Malignant neoplasm of unspecified site of left female breast: Secondary | ICD-10-CM

## 2018-12-23 DIAGNOSIS — Z87891 Personal history of nicotine dependence: Secondary | ICD-10-CM

## 2018-12-23 DIAGNOSIS — Z91048 Other nonmedicinal substance allergy status: Secondary | ICD-10-CM

## 2018-12-23 DIAGNOSIS — Z91013 Allergy to seafood: Secondary | ICD-10-CM

## 2018-12-23 DIAGNOSIS — R509 Fever, unspecified: Secondary | ICD-10-CM | POA: Diagnosis not present

## 2018-12-23 DIAGNOSIS — Z515 Encounter for palliative care: Secondary | ICD-10-CM | POA: Diagnosis not present

## 2018-12-23 DIAGNOSIS — Z91018 Allergy to other foods: Secondary | ICD-10-CM

## 2018-12-23 DIAGNOSIS — D696 Thrombocytopenia, unspecified: Secondary | ICD-10-CM

## 2018-12-23 DIAGNOSIS — Z881 Allergy status to other antibiotic agents status: Secondary | ICD-10-CM

## 2018-12-23 DIAGNOSIS — R7401 Elevation of levels of liver transaminase levels: Secondary | ICD-10-CM

## 2018-12-23 DIAGNOSIS — Z95828 Presence of other vascular implants and grafts: Secondary | ICD-10-CM

## 2018-12-23 LAB — LACTIC ACID, PLASMA
Lactic Acid, Venous: 2.4 mmol/L (ref 0.5–1.9)
Lactic Acid, Venous: 2.9 mmol/L (ref 0.5–1.9)
Lactic Acid, Venous: 3.2 mmol/L (ref 0.5–1.9)

## 2018-12-23 LAB — CBC WITH DIFFERENTIAL/PLATELET
Abs Immature Granulocytes: 0.02 10*3/uL (ref 0.00–0.07)
Basophils Absolute: 0 10*3/uL (ref 0.0–0.1)
Basophils Relative: 0 %
Eosinophils Absolute: 0 10*3/uL (ref 0.0–0.5)
Eosinophils Relative: 0 %
HCT: 28.5 % — ABNORMAL LOW (ref 36.0–46.0)
Hemoglobin: 10.3 g/dL — ABNORMAL LOW (ref 12.0–15.0)
Immature Granulocytes: 1 %
Lymphocytes Relative: 8 %
Lymphs Abs: 0.3 10*3/uL — ABNORMAL LOW (ref 0.7–4.0)
MCH: 33 pg (ref 26.0–34.0)
MCHC: 36.1 g/dL — ABNORMAL HIGH (ref 30.0–36.0)
MCV: 91.3 fL (ref 80.0–100.0)
Monocytes Absolute: 0.5 10*3/uL (ref 0.1–1.0)
Monocytes Relative: 13 %
Neutro Abs: 3 10*3/uL (ref 1.7–7.7)
Neutrophils Relative %: 78 %
Platelets: 38 10*3/uL — ABNORMAL LOW (ref 150–400)
RBC: 3.12 MIL/uL — ABNORMAL LOW (ref 3.87–5.11)
RDW: 15.9 % — ABNORMAL HIGH (ref 11.5–15.5)
WBC: 3.9 10*3/uL — ABNORMAL LOW (ref 4.0–10.5)
nRBC: 0 % (ref 0.0–0.2)

## 2018-12-23 LAB — CBC
HCT: 29.8 % — ABNORMAL LOW (ref 36.0–46.0)
Hemoglobin: 10.2 g/dL — ABNORMAL LOW (ref 12.0–15.0)
MCH: 32.7 pg (ref 26.0–34.0)
MCHC: 34.2 g/dL (ref 30.0–36.0)
MCV: 95.5 fL (ref 80.0–100.0)
Platelets: 45 10*3/uL — ABNORMAL LOW (ref 150–400)
RBC: 3.12 MIL/uL — ABNORMAL LOW (ref 3.87–5.11)
RDW: 16.2 % — ABNORMAL HIGH (ref 11.5–15.5)
WBC: 5.4 10*3/uL (ref 4.0–10.5)
nRBC: 0 % (ref 0.0–0.2)

## 2018-12-23 LAB — COMPREHENSIVE METABOLIC PANEL
ALT: 138 U/L — ABNORMAL HIGH (ref 0–44)
ALT: 124 U/L — ABNORMAL HIGH (ref 0–44)
AST: 223 U/L — ABNORMAL HIGH (ref 15–41)
AST: 199 U/L — ABNORMAL HIGH (ref 15–41)
Albumin: 2.8 g/dL — ABNORMAL LOW (ref 3.5–5.0)
Albumin: 2.6 g/dL — ABNORMAL LOW (ref 3.5–5.0)
Alkaline Phosphatase: 98 U/L (ref 38–126)
Alkaline Phosphatase: 101 U/L (ref 38–126)
Anion gap: 11 (ref 5–15)
Anion gap: 12 (ref 5–15)
BUN: 10 mg/dL (ref 6–20)
BUN: 10 mg/dL (ref 6–20)
CO2: 24 mmol/L (ref 22–32)
CO2: 25 mmol/L (ref 22–32)
Calcium: 8.2 mg/dL — ABNORMAL LOW (ref 8.9–10.3)
Calcium: 8.2 mg/dL — ABNORMAL LOW (ref 8.9–10.3)
Chloride: 95 mmol/L — ABNORMAL LOW (ref 98–111)
Chloride: 94 mmol/L — ABNORMAL LOW (ref 98–111)
Creatinine, Ser: 0.49 mg/dL (ref 0.44–1.00)
Creatinine, Ser: 0.63 mg/dL (ref 0.44–1.00)
GFR calc Af Amer: >60 mL/min (ref >60)
GFR calc Af Amer: >60 mL/min (ref >60)
GFR calc non Af Amer: >60 mL/min (ref >60)
GFR calc non Af Amer: >60 mL/min (ref >60)
Glucose, Bld: 131 mg/dL — ABNORMAL HIGH (ref 70–99)
Glucose, Bld: 165 mg/dL — ABNORMAL HIGH (ref 70–99)
Potassium: 4.2 mmol/L (ref 3.5–5.1)
Potassium: 3.6 mmol/L (ref 3.5–5.1)
Sodium: 130 mmol/L — ABNORMAL LOW (ref 135–145)
Sodium: 131 mmol/L — ABNORMAL LOW (ref 135–145)
Total Bilirubin: 0.9 mg/dL (ref 0.3–1.2)
Total Bilirubin: 1 mg/dL (ref 0.3–1.2)
Total Protein: 5.6 g/dL — ABNORMAL LOW (ref 6.5–8.1)
Total Protein: 5.5 g/dL — ABNORMAL LOW (ref 6.5–8.1)

## 2018-12-23 LAB — CULTURE, BLOOD (ROUTINE X 2)
Culture: NO GROWTH
Culture: NO GROWTH
Special Requests: ADEQUATE
Special Requests: ADEQUATE

## 2018-12-23 LAB — PROCALCITONIN: Procalcitonin: 0.9 ng/mL

## 2018-12-23 LAB — SURGICAL PATHOLOGY

## 2018-12-23 MED ORDER — SODIUM CHLORIDE 0.9 % IV BOLUS
2000.0000 mL | Freq: Once | INTRAVENOUS | Status: AC
  Administered 2018-12-23: 1000 mL via INTRAVENOUS

## 2018-12-23 MED ORDER — PIPERACILLIN-TAZOBACTAM 3.375 G IVPB
3.3750 g | Freq: Three times a day (TID) | INTRAVENOUS | Status: DC
  Administered 2018-12-23 – 2018-12-25 (×4): 3.375 g via INTRAVENOUS
  Filled 2018-12-23 (×6): qty 50

## 2018-12-23 MED ORDER — IBUPROFEN 400 MG PO TABS
400.0000 mg | ORAL_TABLET | Freq: Four times a day (QID) | ORAL | Status: DC | PRN
  Administered 2018-12-23 – 2018-12-25 (×4): 400 mg via ORAL
  Filled 2018-12-23 (×4): qty 1

## 2018-12-23 MED ORDER — METOPROLOL TARTRATE 5 MG/5ML IV SOLN
2.5000 mg | Freq: Once | INTRAVENOUS | Status: AC
  Administered 2018-12-23: 2.5 mg via INTRAVENOUS
  Filled 2018-12-23: qty 5

## 2018-12-23 MED ORDER — IBUPROFEN 400 MG PO TABS
200.0000 mg | ORAL_TABLET | Freq: Once | ORAL | Status: AC
  Administered 2018-12-23: 23:00:00 200 mg via ORAL
  Filled 2018-12-23: qty 1

## 2018-12-23 MED ORDER — DIPHENHYDRAMINE HCL 25 MG PO CAPS
25.0000 mg | ORAL_CAPSULE | ORAL | Status: DC | PRN
  Administered 2018-12-25: 25 mg via ORAL
  Filled 2018-12-23: qty 1

## 2018-12-23 MED ORDER — ENSURE ENLIVE PO LIQD
237.0000 mL | Freq: Two times a day (BID) | ORAL | Status: DC
  Administered 2018-12-24 – 2018-12-25 (×2): 237 mL via ORAL

## 2018-12-23 NOTE — Progress Notes (Signed)
PROGRESS NOTE  Shelby [name] QIW:[id number] DOB: [birth year] DOA: 12/21/2018 PCP: Jerrol Banana., MD  Brief History   The patient is a 48 yr old woman who was discharged from this facility on 12/20/2018 after a stay for abdominal pain, liver mass, and cirrhosis of the liver with ascites, and SIRS. She underwent biopsy of her liver mass and was discharged.   On 12/21/2018 the patient returned to the ED with complaints of abdominal pain and subjective fevers as well as shortness of breath. She was found to have atelectasis and inflammatory changes in her right lower lobe. She has had some changes consistent with SIRS such as elevated lactic acid.   Biopsy results confirm metastasis of breast cancer. Dr. Grayland Ormond discussed this result with the patient. He feels that fevers are related to the cancer. Palliative care has been consulted.  Consultants  . Palliative care . Oncology . Infectious disease.  Procedures  . None  Antibiotics   Anti-infectives (From admission, onward)   None     Subjective  The patient states that she is feeling a little better, although she got some bad news today. No new complaints.   Objective   Vitals:  Vitals:   12/23/18 1147 12/23/18 1600  BP: 118/70   Pulse: (!) 106   Resp: 16   Temp: 98.4 F (36.9 C) 99.4 F (37.4 C)  SpO2: 97%    Exam:  Constitutional:  . The patient is awake, alert, and oriented x 3. No acute distress. Respiratory:  . No increased work of breathing. . No wheezes, rales, or rhonchi . No tactile fremitus . Positive for splinting Cardiovascular:  . Regular rate and rhythm . No murmurs, ectopy, or gallups. . No lateral PMI. No thrills. Abdomen:  . Abdomen is soft, non-tender, non-distended . No hernias, masses, or organomegaly . Normoactive bowel sounds.  Musculoskeletal:  . No cyanosis, clubbing, or edema Skin:  . No rashes, lesions, ulcers . palpation of skin: no induration or nodules Neurologic:   . CN 2-12 intact . Sensation all 4 extremities intact Psychiatric:  . Mental status o Mood, affect appropriate o Orientation to person, place, time  . judgment and insight appear intact   I have personally reviewed the following:   Today's Data  . Vitals, CBC, BMP .  Micro Data  . Blood cultures x 2 have had no growth.  Imaging  . CTA chest: Right lower lobe atelectasis and inflammatory changes. . CT abdomen and pelvis: Extensive hypoattentuating lesions in liver, hepatomegaly, pancreas unremarkable. No abscess seen.  Scheduled Meds: . cholecalciferol  2,000 Units Oral QPM  . [START ON 12/24/2018] feeding supplement (ENSURE ENLIVE)  237 mL Oral BID BM  . fluticasone  1 spray Each Nare Daily  . losartan  50 mg Oral Daily  . montelukast  10 mg Oral QHS  . pantoprazole  40 mg Oral Daily  . sucralfate  1 g Oral TID WC & HS   Principal Problem:   Abdominal pain, acute, right upper quadrant Active Problems:   Hypertension   Transaminitis   Liver mass   Palliative care encounter   LOS: 2 days   A & P  Atelectasis: Present on CTA chest. Due to splinting as a result of liver biopsy and hepatomegaly and capsule expansion. The patient has been ordered to use incentive spirometry, and pain control has been adjusted to allow the patient to be comfortable enough to take deep breaths. There is evidence of some inflammatory changes  in the right lung as well. This is thought to be related to the atelectasis.   Fever, lactic acidosis, tachycardia: SIRS vs infection. ID consulted. Monitor.  Acute abdominal pain/right upper quadrant pain: Attribute patient's right upper quadrant pain to her recent ultrasound-guided biopsy and effect of hepatomegaly on liver capsule. Suspect liver masses to be metastatic breast cancer. Pathology is pending.  BRCA with mets to liver: Palliative care consulted. =  Hypertension: Blood pressure is a little bit high due to pain. We will continue patient on  losartan 50 mg daily. Cardiac diet.  Pain control.  Transaminitis/liver mass: Attribute to metastatic breast cancer. Management per oncology, consider oncology consult.  As needed Dilaudid for pain.  The patient is resting comfortably. No new complaints.  DVT prophylaxis: SCD's  Code Status: Full code  Family Communication: None available. Disposition Plan:  Home  Leilene Diprima, DO [facility] Direct contact: see [email or web address]  7PM-7AM contact night coverage as above 12/23/2018, 6:58 PM  LOS: 1 day

## 2018-12-23 NOTE — Consult Note (Signed)
[place]  [phone] [phone]  ID: Shelby [name] OB: 02/06/1970  MR#: [id number]  [id number]  Patient Care Team: Jerrol Banana., MD as PCP - General (Family Medicine) [name], MD as Consulting Physician (Cardiology) Alfonzo Feller, RN as [facility]  CHIEF COMPLAINT: Abdominal pain, likely progressive stage IV triple negative breast cancer.  INTERVAL HISTORY: Patient is a 48 year old female who was readmitted to the hospital over the weekend with increasing abdominal pain and fevers.  She underwent biopsy of lesions in her liver highly suspicious for underlying recurrent triple negative breast cancer.  Biopsy results are pending at time of this dictation.  She feels highly anxious, but has improved since admission. She has no neurologic complaints.  She denies any recent fevers or illnesses.  She has a fair appetite, but denies weight loss.  She has no chest pain, shortness of breath, cough, or hemoptysis.  She continues to have abdominal pain, but admits it has improved.  She denies any nausea, vomiting, constipation, or diarrhea.  She has no urinary complaints.  Patient offers no further specific complaints today.  REVIEW OF SYSTEMS:   Review of Systems  Constitutional: Positive for fever. Negative for malaise/fatigue.  Respiratory: Negative.  Negative for cough, hemoptysis and shortness of breath.   Cardiovascular: Negative.  Negative for chest pain and leg swelling.  Gastrointestinal: Positive for abdominal pain. Negative for constipation, diarrhea, melena, nausea and vomiting.  Genitourinary: Negative.  Negative for hematuria.  Musculoskeletal: Negative.  Negative for back pain.  Skin: Negative.  Negative for rash.  Neurological: Negative.  Negative for dizziness, focal weakness, weakness and headaches.  Psychiatric/Behavioral: The patient is nervous/anxious.     As per HPI.  Otherwise, a complete review of systems is negative.  PAST MEDICAL HISTORY: Past Medical History:  Diagnosis Date   Anemia    h/o with pregnancy   Anxiety    Asthma    allergy induced-no inhaler   Cancer ([place])    breast left    Complication of anesthesia    Environmental allergies    Family history of adverse reaction to anesthesia    son-breathing problems-coded 1st time when he was 2 and had another surgery at 110 and had to be admitted for breathing problems   Family history of breast cancer    GERD (gastroesophageal reflux disease)    Headache    migraines   Hypertension    Mitral valve disease    Mitral valve prolapse    Painful menstrual periods    PONV (postoperative nausea and vomiting)    Vertigo     PAST SURGICAL HISTORY: Past Surgical History:  Procedure Laterality Date   ABDOMINAL HYSTERECTOMY  2010   supracervical    AXILLARY LYMPH NODE DISSECTION Left 07/12/2018   Procedure: AXILLARY LYMPH NODE DISSECTION;  Surgeon: Robert Bellow, MD;  Location: [facility];  Service: General;  Laterality: Left;   BREAST BIOPSY Left 11/2017   invasive ductal carcinoma and metastatic LN   BREAST BIOPSY WITH SENTINEL LYMPH NODE BIOPSY AND NEEDLE LOCALIZATION Left 07/12/2018   Procedure: BREAST BIOPSY WIDE EXCISION WITH SENTINEL NODE  AND NEEDLE LOCALIZATION OF [name] LEFT;  Surgeon: Robert Bellow, MD;  Location: [facility];  Service: General;  Laterality: Left;   BUNIONECTOMY Bilateral    MANDIBLE RECONSTRUCTION     age 6-underbite   PORTACATH PLACEMENT Right 12/31/2017   Procedure: INSERTION PORT-A-CATH;  Surgeon: Robert Bellow, MD;  Location: [facility];  Service: General;  Laterality: Right;   RE-EXCISION OF BREAST LUMPECTOMY Left 07/24/2018   Procedure: RE-EXCISION OF BREAST LUMPECTOMY LEFT;  Surgeon: Robert Bellow, MD;  Location: [facility];  Service: General;  Laterality: Left;   SHOULDER ARTHROSCOPY WITH OPEN ROTATOR CUFF REPAIR  Right 04/26/2017   Procedure: SHOULDER ARTHROSCOPY WITH OPEN ROTATOR CUFF REPAIR,SUBACROMINAL DECOMPRESSION;  Surgeon: Thornton Park, MD;  Location: [facility];  Service: Orthopedics;  Laterality: Right;   TONSILLECTOMY      FAMILY HISTORY: Family History  Problem Relation Age of Onset   Breast cancer Mother 28   Diabetes Father    Cancer Maternal Uncle        colon   Breast cancer Maternal Grandmother 70    ADVANCED DIRECTIVES (Y/N):  @ADVDIR @  HEALTH MAINTENANCE: Social History   Tobacco Use   Smoking status: Former Smoker    Packs/day: 0.50    Years: 10.00    Pack years: 5.00    Types: Cigarettes    Quit date: 04/20/2007    Years since quitting: 11.6   Smokeless tobacco: Never Used   Tobacco comment: social smoker back then  Substance Use Topics   Alcohol use: Yes    Comment: occas   Drug use: No     Colonoscopy:  PAP:  Bone density:  Lipid panel:  Allergies  Allergen Reactions   Hydrocodone Hives    Swollen face - many years ago. Thinks she has tolerated since.   Iodine Hives   Peanut Butter Flavor Other (See Comments)    Made mouth tingle   Shellfish Allergy Hives   Vancomycin Other (See Comments)    Itching and forehead turned red    Current Facility-Administered Medications  Medication Dose Route Frequency Provider Last Rate Last Dose   ALPRAZolam Duanne Moron) tablet 0.25 mg  0.25 mg Oral BID PRN Para Skeans, MD   0.25 mg at 12/23/18 1033   cholecalciferol (VITAMIN D) tablet 2,000 Units  2,000 Units Oral QPM Para Skeans, MD   2,000 Units at 12/22/18 1752   diphenhydrAMINE (BENADRYL) capsule 25 mg  25 mg Oral Q4H PRN Swayze, Ava, DO       fluticasone (FLONASE) 50 MCG/ACT nasal spray 1 spray  1 spray Each Nare Daily Florina Ou V, MD       HYDROmorphone (DILAUDID) injection 0.5 mg  0.5 mg Intravenous Q3H PRN Florina Ou V, MD   0.5 mg at 12/23/18 0555   ibuprofen (ADVIL) tablet 400 mg  400 mg Oral Q6H PRN Lang Snow, NP    400 mg at 12/23/18 0332   losartan (COZAAR) tablet 50 mg  50 mg Oral Daily Florina Ou V, MD   50 mg at 12/22/18 1752   montelukast (SINGULAIR) tablet 10 mg  10 mg Oral QHS Para Skeans, MD       ondansetron [facility]) injection 4 mg  4 mg Intravenous Q6H PRN Lang Snow, NP   4 mg at 12/22/18 1601   oxyCODONE (Oxy IR/ROXICODONE) immediate release tablet 5 mg  5 mg Oral Q3H PRN Swayze, Ava, DO   5 mg at 12/22/18 1601   pantoprazole (PROTONIX) EC tablet 40 mg  40 mg Oral Daily Florina Ou V, MD   40 mg at 12/23/18 0939   sucralfate (CARAFATE) tablet 1 g  1 g Oral TID WC & HS Para Skeans, MD   1 g at 12/23/18 0939    OBJECTIVE: Vitals:   12/23/18 0235  12/23/18 0543  BP: 120/77 113/78  Pulse: (!) 108 (!) 106  Resp: 20 20  Temp: (!) 102.1 F (38.9 C) 99.4 F (37.4 C)  SpO2: 96% 97%     Body mass index is 23.08 kg/m.    ECOG FS:1 - Symptomatic but completely ambulatory  General: Well-developed, well-nourished, no acute distress. Eyes: Pink conjunctiva, anicteric sclera. HEENT: Normocephalic, moist mucous membranes, clear oropharnyx. Lungs: Clear to auscultation bilaterally. Heart: Regular rate and rhythm. No rubs, murmurs, or gallops. Abdomen: Mildly tender, hepatomegaly. Musculoskeletal: No edema, cyanosis, or clubbing. Neuro: Alert, answering all questions appropriately. Cranial nerves grossly intact. Skin: No rashes or petechiae noted. Psych: Normal affect. Lymphatics: No cervical, calvicular, axillary or inguinal LAD.  LAB RESULTS:  Lab Results  Component Value Date   NA 130 (L) 12/23/2018   K 4.2 12/23/2018   CL 95 (L) 12/23/2018   CO2 24 12/23/2018   GLUCOSE 131 (H) 12/23/2018   BUN 10 12/23/2018   CREATININE 0.49 12/23/2018   CALCIUM 8.2 (L) 12/23/2018   PROT 5.6 (L) 12/23/2018   ALBUMIN 2.8 (L) 12/23/2018   AST 223 (H) 12/23/2018   ALT 138 (H) 12/23/2018   ALKPHOS 98 12/23/2018   BILITOT 0.9 12/23/2018   GFRNONAA >60 12/23/2018   GFRAA >60  12/23/2018    Lab Results  Component Value Date   WBC 5.4 12/23/2018   NEUTROABS 2.2 12/19/2018   HGB 10.2 (L) 12/23/2018   HCT 29.8 (L) 12/23/2018   MCV 95.5 12/23/2018   PLT 45 (L) 12/23/2018     STUDIES: Ct Abdomen Pelvis Wo Contrast  Addendum Date: 12/18/2018   ADDENDUM REPORT: 12/18/2018 20:18 ADDENDUM: These results were called by telephone on 12/18/2018 at 8:18 pm to provider [facility] , who verbally acknowledged these results. Electronically Signed   By: Lovena Le M.D.   On: 12/18/2018 20:18   Result Date: 12/18/2018 CLINICAL DATA:  Abdominal pain and distension, undergoing chemotherapy for breast cancer, history of IBS and diverticulitis EXAM: CT ABDOMEN AND PELVIS WITHOUT CONTRAST TECHNIQUE: Multidetector CT imaging of the abdomen and pelvis was performed following the standard protocol without IV contrast. COMPARISON:  CT abdomen pelvis 01/01/2018 FINDINGS: Lower chest: The patchy areas of subpleural ground-glass opacity could reflect atelectasis or early infection. Cardiac size is normal. Trace pericardial effusion, similar to prior. Hepatobiliary: Ill-defined regions of hypoattenuation are seen the anterior left lobe liver. Slightly nodular hepatic surface contour which is new from comparison exam. These features are both incompletely assessed in the absence of contrast media. Gallbladder appears largely contracted at the time of exam. No biliary ductal dilatation. Pancreas: Unremarkable. No pancreatic ductal dilatation or surrounding inflammatory changes. Spleen: Normal in size without focal abnormality. Adrenals/Urinary Tract: No suspicious adrenal lesions. No visible or contour deforming renal lesions. No urolithiasis or hydronephrosis mild circumferential bladder wall thickening. Stomach/Bowel: Distal esophagus, stomach and duodenal sweep are unremarkable. No small bowel wall thickening or dilatation. No evidence of obstruction. Appendix is not clearly identified. No  pericecal inflammation. There is some mild mural thickening of the splenic flexure and descending colon remaining portions of the colon are free of mural thickening or dilatation. Vascular/Lymphatic: Upper abdominal venous collateralization and splenorenal shunting is noted. No suspicious or enlarged lymph nodes in the included lymphatic chains. Reproductive: Uterus is surgically absent. No concerning adnexal lesions. Other: Small volume of low-attenuation ascites noted in the low abdomen. Small volume of perihepatic low-attenuation ascites is present as well. Musculoskeletal: Multilevel degenerative changes are present in the imaged portions  of the spine. No acute osseous abnormality or suspicious osseous lesion. IMPRESSION: 1. Interval development of cirrhotic stigmata including a nodular liver surface contour and increasing upper abdominal venous collateralization. Small volume of ascites is noted as well. 2. Ill-defined regions of hypoattenuation are seen within the anterior left lobe liver, which are incompletely assessed in the absence of contrast media. Furthermore findings are worrisome in the setting of known malignancy and potential intrinsic liver disease. Consider further evaluation with MRI or ultrasound if patient is unable to tolerate CT contrast due to allergy. 3. Mild mural thickening of the splenic flexure and descending colon, could reflect change related to the additional cirrhotic findings/portal colopathy though should exclude a an acute colitis clinically. 4. Patchy areas of subpleural ground-glass opacity in the visualized lung bases could reflect atelectasis or early infection. 5. Mild circumferential bladder wall thickening, which may be related to underdistention. Correlate with urinalysis to exclude cystitis. 6. Unchanged trace pericardial effusion. Electronically Signed: By: Lovena Le M.D. On: 12/18/2018 20:07   Ct Angio Chest Pe W And/or Wo Contrast  Result Date:  12/21/2018 CLINICAL DATA:  48 y.o. female discharged yesterday for concerns of abdominal pain with a work-up regarding her liver as well as she is meeting SIRS criteria with feverPatient comes back today reports she continues to have significant pain and discomfort. She has been having ongoing and severe pain located in the mid to right upper abdomen, also she reports fairly significant pain when she takes a deep breath over the right lower lungShe has been feeling warm had a fever when she was in the hospital yesterday. Some nausea. No headaches. Tested negative for Covid a few days ago pain is located primarily in the right upper abdomen under the right rib cage. EXAM: CT ANGIOGRAPHY CHEST CT ABDOMEN AND PELVIS WITH CONTRAST TECHNIQUE: Multidetector CT imaging of the chest was performed using the standard protocol during bolus administration of intravenous contrast. Multiplanar CT image reconstructions and MIPs were obtained to evaluate the vascular anatomy. Multidetector CT imaging of the abdomen and pelvis was performed using the standard protocol during bolus administration of intravenous contrast. CONTRAST:  48mL OMNIPAQUE IOHEXOL 350 MG/ML SOLN COMPARISON:  Chest CTA and abdomen and pelvis CT dated 12/19/2018. FINDINGS: CTA CHEST FINDINGS Cardiovascular: There is satisfactory opacification of the pulmonary arteries to the segmental level. No evidence of a pulmonary embolism. Mediastinum/Nodes: No enlarged mediastinal, hilar, or axillary lymph nodes. Thyroid gland, trachea, and esophagus demonstrate no significant findings. Lungs/Pleura: Small bilateral pleural effusions. There is dependent opacity in the lower lobes consistent with atelectasis. Small areas of ground-glass opacity and small lung nodules noted. Dominant nodule in the right upper lobe, image 46, series 4, 6 mm. Several other small nodules are new from the prior CT. Area of ground-glass opacity adjacent to the oblique fissure in the left upper  lobe is new. No pneumothorax. Musculoskeletal: No chest wall abnormality. No acute or significant osseous findings. Review of the MIP images confirms the above findings. CT ABDOMEN and PELVIS FINDINGS Hepatobiliary: Enlarged liver. There are multiple hypoattenuating areas throughout the liver. These areas are more apparent and more extensive than on the recent prior CT, particularly evident at the dome of segment 4A, along the anterior aspect of the right lobe and medial segment of the left lobe and along the inferior aspect of the right lobe, segment 5 and 6. Gallbladder is mostly collapsed. There is increased attenuation material within the gallbladder similar to the prior CT. No bile duct dilation  Pancreas: Unremarkable. No pancreatic ductal dilatation or surrounding inflammatory changes. Spleen: Normal in size without focal abnormality. Adrenals/Urinary Tract: No adrenal masses. Right kidney displaced inferiorly by the enlarged liver. Symmetric renal enhancement and excretion. No renal masses, stones or hydronephrosis. Normal ureters. Bladder wall is prominent, unchanged from the recent prior study. No bladder masses. Stomach/Bowel: Stomach is unremarkable, mostly decompressed. Small bowel and colon are normal in caliber. No wall thickening or inflammation. Appendix not discretely seen. No findings of appendicitis. Vascular/Lymphatic: As noted on the prior CT, left portal vein is attenuated, but does show enhancement. No evidence of thrombosis. There are venous collaterals in the upper abdomen. Aorta is unremarkable. No enlarged lymph nodes. Prominent Peri celiac node noted on the prior study is stable. Reproductive: Status post hysterectomy. No adnexal masses. Other: Small amount of ascites increased when compared to the prior CT. Musculoskeletal: No acute or significant osseous findings. Review of the MIP images confirms the above findings. IMPRESSION: CHEST CTA 1. No evidence of a pulmonary embolism. 2. Small  pleural effusions, new from the prior CT. There is increased opacity in the lower lobes that is consistent with atelectasis. There several new small nodules and small areas of ground-glass opacity when compared to the prior CT. Suspect these are inflammatory given change from the recent exam. ABDOMEN AND PELVIS CT 1. Multiple hypoattenuating liver lesions. These appear to have progressed when compared to the prior CT, although this change could be due to differences in contrast timing. The liver is enlarged, stable from the recent exam. Findings are concerning for multifocal hepatocellular carcinoma. Follow-up liver MRI without and with contrast, when the patient can tolerate the procedure, is recommended. 2. Small amount ascites has increased in quantity from the prior study. 3. There are vascular collaterals in the upper abdomen, stable. Electronically Signed   By: Lajean Manes M.D.   On: 12/21/2018 18:36   Ct Angio Chest Pe W And/or Wo Contrast  Result Date: 12/19/2018 CLINICAL DATA:  Chest pain, complex, intermediate/high prob of ACS/PE/AAS; Abd distension Epigastric pain. Exertional dyspnea. Cough. Weakness. History of breast cancer. EXAM: CT ANGIOGRAPHY CHEST CT ABDOMEN AND PELVIS WITH CONTRAST TECHNIQUE: Multidetector CT imaging of the chest was performed using the standard protocol during bolus administration of intravenous contrast. Multiplanar CT image reconstructions and MIPs were obtained to evaluate the vascular anatomy. Multidetector CT imaging of the abdomen and pelvis was performed using the standard protocol during bolus administration of intravenous contrast. No reported contrast reaction. CONTRAST:  11mL OMNIPAQUE IOHEXOL 350 MG/ML SOLN COMPARISON:  Abdominal CT without contrast yesterday. Chest CT 01/01/2018 FINDINGS: CTA CHEST FINDINGS Cardiovascular: There are no filling defects within the pulmonary arteries to suggest pulmonary embolus. Thoracic aorta is normal in caliber without  dissection. Left vertebral artery arises directly from the thoracic aorta, variant arch anatomy. Heart is normal in size. No pericardial effusion. Right chest port in place with tip in the SVC. Mediastinum/Nodes: No mediastinal or hilar adenopathy. No visualized thyroid nodule. No esophageal wall thickening. Lungs/Pleura: Mild patchy areas of subpleural nodular and ground-glass opacities. Basilar findings are unchanged from CT yesterday. 5 mm right upper lobe pulmonary nodule series 6, image 41. Sub solid nodularity slightly more inferiorly in the right lung series 6, image 45 measuring 8 mm. Tiny 2 mm nodule in the right upper lung series 6, image 32, may be fissural. Pulmonary nodules are new from prior chest CT. Perifissural in subpleural opacity in the right upper lobe, also series 6, image 32, nonspecific. No pulmonary  edema. No pleural fluid. Trachea and mainstem bronchi are patent. Musculoskeletal: There are no acute or suspicious osseous abnormalities. Review of the MIP images confirms the above findings. CT ABDOMEN and PELVIS FINDINGS Hepatobiliary: Hepatic parenchyma diffusely heterogeneous with nodular contours. Liver is increased in size from December 2019 CT. Gallbladder is decompressed and not well assessed, intraluminal high density may be stones or sludge. Questionable wall thickening may be related to nondistention. Pancreas: No ductal dilatation or inflammation. Spleen: Normal in size without focal abnormality. Adrenals/Urinary Tract: Normal adrenal glands. No hydronephrosis or perinephric edema. Homogeneous renal enhancement with symmetric excretion on delayed phase imaging. Urinary bladder is partially distended, mild bladder wall thickening. Stomach/Bowel: Lack of enteric contrast and paucity of intra-abdominal fat limits detailed bowel assessment. The stomach is nondistended. Questionable pre-pyloric gastric wall thickening. No small bowel obstruction or inflammatory change. Appendix not  discretely visualized, no pericecal inflammation to suggest appendicitis. Transverse colon is tortuous. Mild colonic wall thickening at the splenic flexure, colon is decompressed, this is not well assessed. Questionable areas of descending colonic wall thickening, for example series 11, image 61. Vascular/Lymphatic: Right portal vein is patent. The left portal vein is attenuated without evidence of thrombus. Tortuous splenic vein with prominent collaterals in the left upper quadrant and left retroperitoneum. 9 mm perigastric lymph node series 11, image 29, nonspecific. No pelvic adenopathy. Reproductive: Uterus surgically absent. Left ovary tentatively visualized and normal. Right ovary not definitively seen. No obvious adnexal mass. Other: Small volume of pelvic ascites measuring simple fluid density, similar to yesterday. Small perihepatic ascites anteriorly. No free air. Musculoskeletal: There are no acute or suspicious osseous abnormalities. Scattered bone islands in the pelvis. Review of the MIP images confirms the above findings. IMPRESSION: CT chest: 1. No pulmonary embolus. 2. Mild patchy areas of subpleural nodular and ground-glass opacities in both lungs. Slightly more confluent subpleural/perifissural right upper lobe opacity. Findings may be due to atelectasis or infection. 3. Small pulmonary nodules are new from December, infectious/inflammatory or metastatic. CT abdomen/pelvis: 1. Diffusely heterogeneous liver with nodular contours. Liver has increased in size since December 2019 CT. This may be due to underlying cirrhotic change versus diffuse metastatic disease. Recommend further evaluation with abdominal MRI. Left upper quadrant and retroperitoneal collaterals can be seen with portal hypertension. Left portal vein is attenuated, however no discrete thrombus. 2. Questionable pre-pyloric gastric wall thickening, peptic ulcer disease versus gastritis. 3. Bladder wall thickening, small volume  abdominopelvic ascites, and areas of colonic wall thickening at the splenic flexure and descending colon are unchanged from CT yesterday. 4. Gallbladder is decompressed and not well assessed, intraluminal high density may be stones or sludge. Questionable gallbladder wall thickening may be related to nondistention or liver disease. This could be further evaluated with MRI or right upper quadrant ultrasound. Electronically Signed   By: Keith Rake M.D.   On: 12/19/2018 05:33   Mr Abdomen W Wo Contrast  Result Date: 12/19/2018 CLINICAL DATA:  Abdominal pain in a patient with breast cancer. EXAM: MRI ABDOMEN WITHOUT AND WITH CONTRAST TECHNIQUE: Multiplanar multisequence MR imaging of the abdomen was performed both before and after the administration of intravenous contrast. CONTRAST:  47mL GADAVIST GADOBUTROL 1 MMOL/ML IV SOLN COMPARISON:  CT of 12/19/2018 FINDINGS: Lower chest: Limited assessment of the lower chest on MRI is unremarkable. Hepatobiliary: Diffuse infiltration of much of the liver, nearly the entire left hepatic lobe, medial section and lateral section in addition to anterior right hepatic lobe with confluent infiltrative restricted diffusion and heterogeneous enhancement  measuring approximately 21 by 7.4 cm. Diffuse metastatic disease is strongly suspected. Boundaries are well-demarcated without adjacent edema in the bordering hepatic parenchyma Discrete hepatic lesions are also scattered about the right hepatic lobe largest in the dome of the right hemi liver measuring approximately 2.5 cm in greatest dimension. These discrete lesions display a targetoid appearance. There are numerous lesions in the right hepatic lobe, lesions are found in all hepatic subsegment. Pancreas: No mass, inflammatory changes, or other parenchymal abnormality identified. Mild ductal distension in the tail of the pancreas may be due to adjacent mass effect. No visible pancreatic lesion is noted. Spleen:  Within normal  limits in size and appearance. Adrenals/Urinary Tract: No masses identified. No evidence of hydronephrosis. Stomach/Bowel: Limited assessment of the gastrointestinal tract without signs of acute process. Vascular/Lymphatic: Left-sided peri-renal venous collaterals of uncertain significance perhaps related to elevated portal pressures in the setting of diffuse hepatic involvement. 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 Right portal vein is patent. Scattered small lymph nodes in the upper abdomen. There is either slow flow or thrombus within the main portal vein at the level of the splenic portal confluence extending in the splenic vein. The SMV is patent. There is mass effect upon the vessel at the level of the splenic portal confluence due to hepatic enlargement. This is best seen on subtraction images, image 54, series 20. Note that slow flow in an otherwise opacified vessel can cause artifact with a similar appearance however, this is seen on contrasted and non contrasted imaging with a similar appearance. Other:  None. Musculoskeletal: No signs of acute or destructive bone process. IMPRESSION: 1. Diffuse infiltration of much of the liver, nearly the entire left hepatic lobe, with confluent infiltrative restricted diffusion and heterogeneous enhancement measuring approximately 21 by 7.4 cm. Diffuse metastatic disease is strongly suspected with multifocal lesions in the right hepatic lobe. Superimposed infection is difficult to exclude though areas at the boundary of these lesions show no overt signs of edema. 2. Highly narrowed or occluded left portal vein with potential thrombus versus slow flow in the splenoportal confluence. This was patent on the exam of 12/19/2018; however, given above findings a venous phase CT or even ultrasound hepatic Doppler with special attention to the splenic portal confluence may be helpful to exclude this possibility, finding is also exhibited on TrueFISP sequence (image 24, series 10).  3. 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 4. Mild ductal distension in the tail of the pancreas, potentially due to mass effect. Attention on follow-up. 5. Critical Value/emergent results were called by telephone at the time of interpretation on 12/19/2018 at 6:43 pm to [place] , who verbally acknowledged these results. Electronically Signed   By: Zetta Bills M.D.   On: 12/19/2018 18:24   US Abdomen Complete  Result Date: 12/19/2018 CLINICAL DATA:  Abdominal pain EXAM: ABDOMEN ULTRASOUND COMPLETE COMPARISON:  MRI earlier today FINDINGS: Gallbladder: Gallbladder is contracted with associated mild wall thickening measuring up to 4 mm. Sludge noted within the gallbladder. No visible stones. Common bile duct: Diameter: Normal caliber, 3 mm Liver: Diffusely heterogeneous echotexture throughout the liver corresponding to the abnormality seen on today's MRI suspicious forinfiltrating mass. Main portal vein is patent on color Doppler imaging with normal direction of blood flow towards the liver. The area of possible thrombus seen on MRI at the level of the splenic portal confluence not visualized. IVC: No abnormality visualized. Pancreas: Visualized  portion unremarkable. Spleen: Size and appearance within normal limits. Right Kidney: Length: 11.0 cm. Echogenicity within normal limits. No mass or hydronephrosis visualized. Left Kidney: Length: 10.5 cm. Echogenicity within normal limits. No mass or hydronephrosis visualized. Abdominal aorta: No aneurysm visualized. Other findings: None. IMPRESSION: Heterogeneous appearance throughout much of the left hepatic lobe as seen on today's MRI concerning for infiltrating mass/tumor. Main portal vein is patent. Contracted gallbladder with gallbladder wall thickening likely related to contracted state. Sludge within the gallbladder. No sonographic Murphy sign or  visible stones. Electronically Signed   By: Rolm Baptise M.D.   On: 12/19/2018 20:35   Ct Abdomen Pelvis W Contrast  Result Date: 12/21/2018 CLINICAL DATA:  48 y.o. female discharged yesterday for concerns of abdominal pain with a work-up regarding her liver as well as she is meeting SIRS criteria with feverPatient comes back today reports she continues to have significant pain and discomfort. She has been having ongoing and severe pain located in the mid to right upper abdomen, also she reports fairly significant pain when she takes a deep breath over the right lower lungShe has been feeling warm had a fever when she was in the hospital yesterday. Some nausea. No headaches. Tested negative for Covid a few days ago pain is located primarily in the right upper abdomen under the right rib cage. EXAM: CT ANGIOGRAPHY CHEST CT ABDOMEN AND PELVIS WITH CONTRAST TECHNIQUE: Multidetector CT imaging of the chest was performed using the standard protocol during bolus administration of intravenous contrast. Multiplanar CT image reconstructions and MIPs were obtained to evaluate the vascular anatomy. Multidetector CT imaging of the abdomen and pelvis was performed using the standard protocol during bolus administration of intravenous contrast. CONTRAST:  60mL OMNIPAQUE IOHEXOL 350 MG/ML SOLN COMPARISON:  Chest CTA and abdomen and pelvis CT dated 12/19/2018. FINDINGS: CTA CHEST FINDINGS Cardiovascular: There is satisfactory opacification of the pulmonary arteries to the segmental level. No evidence of a pulmonary embolism. Mediastinum/Nodes: No enlarged mediastinal, hilar, or axillary lymph nodes. Thyroid gland, trachea, and esophagus demonstrate no significant findings. Lungs/Pleura: Small bilateral pleural effusions. There is dependent opacity in the lower lobes consistent with atelectasis. Small areas of ground-glass opacity and small lung nodules noted. Dominant nodule in the right upper lobe, image 46, series 4, 6 mm.  Several other small nodules are new from the prior CT. Area of ground-glass opacity adjacent to the oblique fissure in the left upper lobe is new. No pneumothorax. Musculoskeletal: No chest wall abnormality. No acute or significant osseous findings. Review of the MIP images confirms the above findings. CT ABDOMEN and PELVIS FINDINGS Hepatobiliary: Enlarged liver. There are multiple hypoattenuating areas throughout the liver. These areas are more apparent and more extensive than on the recent prior CT, particularly evident at the dome of segment 4A, along the anterior aspect of the right lobe and medial segment of the left lobe and along the inferior aspect of the right lobe, segment 5 and 6. Gallbladder is mostly collapsed. There is increased attenuation material within the gallbladder similar to the prior CT. No bile duct dilation Pancreas: Unremarkable. No pancreatic ductal dilatation or surrounding inflammatory changes. Spleen: Normal in size without focal abnormality. Adrenals/Urinary Tract: No adrenal masses. Right kidney displaced inferiorly by the enlarged liver. Symmetric renal enhancement and excretion. No renal masses, stones or hydronephrosis. Normal ureters. Bladder wall is prominent, unchanged from the recent prior study. No bladder masses. Stomach/Bowel: Stomach is unremarkable, mostly decompressed. Small bowel and colon are normal in caliber. No wall  thickening or inflammation. Appendix not discretely seen. No findings of appendicitis. Vascular/Lymphatic: As noted on the prior CT, left portal vein is attenuated, but does show enhancement. No evidence of thrombosis. There are venous collaterals in the upper abdomen. Aorta is unremarkable. No enlarged lymph nodes. Prominent Peri celiac node noted on the prior study is stable. Reproductive: Status post hysterectomy. No adnexal masses. Other: Small amount of ascites increased when compared to the prior CT. Musculoskeletal: No acute or significant osseous  findings. Review of the MIP images confirms the above findings. IMPRESSION: CHEST CTA 1. No evidence of a pulmonary embolism. 2. Small pleural effusions, new from the prior CT. There is increased opacity in the lower lobes that is consistent with atelectasis. There several new small nodules and small areas of ground-glass opacity when compared to the prior CT. Suspect these are inflammatory given change from the recent exam. ABDOMEN AND PELVIS CT 1. Multiple hypoattenuating liver lesions. These appear to have progressed when compared to the prior CT, although this change could be due to differences in contrast timing. The liver is enlarged, stable from the recent exam. Findings are concerning for multifocal hepatocellular carcinoma. Follow-up liver MRI without and with contrast, when the patient can tolerate the procedure, is recommended. 2. Small amount ascites has increased in quantity from the prior study. 3. There are vascular collaterals in the upper abdomen, stable. Electronically Signed   By: Lajean Manes M.D.   On: 12/21/2018 18:36   Ct Abdomen Pelvis W Contrast  Result Date: 12/19/2018 CLINICAL DATA:  Chest pain, complex, intermediate/high prob of ACS/PE/AAS; Abd distension Epigastric pain. Exertional dyspnea. Cough. Weakness. History of breast cancer. EXAM: CT ANGIOGRAPHY CHEST CT ABDOMEN AND PELVIS WITH CONTRAST TECHNIQUE: Multidetector CT imaging of the chest was performed using the standard protocol during bolus administration of intravenous contrast. Multiplanar CT image reconstructions and MIPs were obtained to evaluate the vascular anatomy. Multidetector CT imaging of the abdomen and pelvis was performed using the standard protocol during bolus administration of intravenous contrast. No reported contrast reaction. CONTRAST:  185mL OMNIPAQUE IOHEXOL 350 MG/ML SOLN COMPARISON:  Abdominal CT without contrast yesterday. Chest CT 01/01/2018 FINDINGS: CTA CHEST FINDINGS Cardiovascular: There are no  filling defects within the pulmonary arteries to suggest pulmonary embolus. Thoracic aorta is normal in caliber without dissection. Left vertebral artery arises directly from the thoracic aorta, variant arch anatomy. Heart is normal in size. No pericardial effusion. Right chest port in place with tip in the SVC. Mediastinum/Nodes: No mediastinal or hilar adenopathy. No visualized thyroid nodule. No esophageal wall thickening. Lungs/Pleura: Mild patchy areas of subpleural nodular and ground-glass opacities. Basilar findings are unchanged from CT yesterday. 5 mm right upper lobe pulmonary nodule series 6, image 41. Sub solid nodularity slightly more inferiorly in the right lung series 6, image 45 measuring 8 mm. Tiny 2 mm nodule in the right upper lung series 6, image 32, may be fissural. Pulmonary nodules are new from prior chest CT. Perifissural in subpleural opacity in the right upper lobe, also series 6, image 32, nonspecific. No pulmonary edema. No pleural fluid. Trachea and mainstem bronchi are patent. Musculoskeletal: There are no acute or suspicious osseous abnormalities. Review of the MIP images confirms the above findings. CT ABDOMEN and PELVIS FINDINGS Hepatobiliary: Hepatic parenchyma diffusely heterogeneous with nodular contours. Liver is increased in size from December 2019 CT. Gallbladder is decompressed and not well assessed, intraluminal high density may be stones or sludge. Questionable wall thickening may be related to nondistention. Pancreas: No  ductal dilatation or inflammation. Spleen: Normal in size without focal abnormality. Adrenals/Urinary Tract: Normal adrenal glands. No hydronephrosis or perinephric edema. Homogeneous renal enhancement with symmetric excretion on delayed phase imaging. Urinary bladder is partially distended, mild bladder wall thickening. Stomach/Bowel: Lack of enteric contrast and paucity of intra-abdominal fat limits detailed bowel assessment. The stomach is nondistended.  Questionable pre-pyloric gastric wall thickening. No small bowel obstruction or inflammatory change. Appendix not discretely visualized, no pericecal inflammation to suggest appendicitis. Transverse colon is tortuous. Mild colonic wall thickening at the splenic flexure, colon is decompressed, this is not well assessed. Questionable areas of descending colonic wall thickening, for example series 11, image 61. Vascular/Lymphatic: Right portal vein is patent. The left portal vein is attenuated without evidence of thrombus. Tortuous splenic vein with prominent collaterals in the left upper quadrant and left retroperitoneum. 9 mm perigastric lymph node series 11, image 29, nonspecific. No pelvic adenopathy. Reproductive: Uterus surgically absent. Left ovary tentatively visualized and normal. Right ovary not definitively seen. No obvious adnexal mass. Other: Small volume of pelvic ascites measuring simple fluid density, similar to yesterday. Small perihepatic ascites anteriorly. No free air. Musculoskeletal: There are no acute or suspicious osseous abnormalities. Scattered bone islands in the pelvis. Review of the MIP images confirms the above findings. IMPRESSION: CT chest: 1. No pulmonary embolus. 2. Mild patchy areas of subpleural nodular and ground-glass opacities in both lungs. Slightly more confluent subpleural/perifissural right upper lobe opacity. Findings may be due to atelectasis or infection. 3. Small pulmonary nodules are new from December, infectious/inflammatory or metastatic. CT abdomen/pelvis: 1. Diffusely heterogeneous liver with nodular contours. Liver has increased in size since December 2019 CT. This may be due to underlying cirrhotic change versus diffuse metastatic disease. Recommend further evaluation with abdominal MRI. Left upper quadrant and retroperitoneal collaterals can be seen with portal hypertension. Left portal vein is attenuated, however no discrete thrombus. 2. Questionable pre-pyloric  gastric wall thickening, peptic ulcer disease versus gastritis. 3. Bladder wall thickening, small volume abdominopelvic ascites, and areas of colonic wall thickening at the splenic flexure and descending colon are unchanged from CT yesterday. 4. Gallbladder is decompressed and not well assessed, intraluminal high density may be stones or sludge. Questionable gallbladder wall thickening may be related to nondistention or liver disease. This could be further evaluated with MRI or right upper quadrant ultrasound. Electronically Signed   By: Keith Rake M.D.   On: 12/19/2018 05:33   US Biopsy (liver)  Result Date: 12/20/2018 INDICATION: History of left breast carcinoma with imaging evidence of probable diffuse metastatic disease in the liver, more prominently in the left lobe. The patient presents for biopsy. EXAM: ULTRASOUND GUIDED CORE BIOPSY OF LIVER MEDICATIONS: None. ANESTHESIA/SEDATION: Fentanyl 100 mcg IV; Versed 2.0 mg IV Moderate Sedation Time:  20 minutes. The patient was continuously monitored during the procedure by the interventional radiology nurse under my direct supervision. PROCEDURE: The procedure, risks, benefits, and alternatives were explained to the patient. Questions regarding the procedure were encouraged and answered. The patient understands and consents to the procedure. A time-out was performed prior to initiating the procedure. Ultrasound was used to localize lesions within the liver. The anterior abdominal wall was prepped with chlorhexidine in a sterile fashion, and a sterile drape was applied covering the operative field. A sterile gown and sterile gloves were used for the procedure. Local anesthesia was provided with 1% Lidocaine. Under direct ultrasound guidance, a 17 gauge trocar needle was advanced into the left lobe of the liver. After confirming  needle tip position, coaxial 18 gauge core biopsy samples were obtained. A total of 3 samples were obtained and submitted in  formalin. A slurry of Gel-Foam pledgets was then slowly injected via the outer needle as the needle was retracted. Additional ultrasound was performed. COMPLICATIONS: None immediate. FINDINGS: Ultrasound demonstrates heterogeneous appearance of the liver with ill-defined lesions throughout the left lobe. Solid core biopsy samples were obtained by sampling lesions within the lateral segment of the left lobe. IMPRESSION: Ultrasound-guided core biopsy performed of hepatic lesions within the left lobe. Electronically Signed   By: Aletta Edouard M.D.   On: 12/20/2018 13:44   Dg Chest Portable 1 View  Result Date: 12/21/2018 CLINICAL DATA:  Pleuritic right chest pain EXAM: PORTABLE CHEST 1 VIEW COMPARISON:  12/18/2018 chest radiograph. FINDINGS: Stable right subclavian Port-A-Cath terminating over the right atrium. Stable cardiomediastinal silhouette with normal heart size. No pneumothorax. No pleural effusion. No pulmonary edema. Curvilinear left lung base opacities. No acute consolidative airspace disease. IMPRESSION: Curvilinear left lung base opacities, favor scarring or atelectasis. No acute consolidative airspace disease. Electronically Signed   By: Ilona Sorrel M.D.   On: 12/21/2018 13:06   Dg Chest Portable 1 View  Result Date: 12/18/2018 CLINICAL DATA:  Fever abdominal pain EXAM: PORTABLE CHEST 1 VIEW COMPARISON:  12/31/2017 FINDINGS: Right-sided central venous port with tip over the cavoatrial region. No focal airspace disease or effusion. Normal heart size. No pneumothorax. IMPRESSION: No active disease. Electronically Signed   By: Donavan Foil M.D.   On: 12/18/2018 19:49    ASSESSMENT: Abdominal pain, likely progressive stage IV triple negative breast cancer.  PLAN:   1.  Triple negative breast cancer:  CT scan results from January 01, 2018 did not reveal any metastatic disease.  Patient completed her neoadjuvant chemotherapy with Adriamycin and Cytoxan, followed by carboplatinum and Taxol,  on Jun 20, 2018.  Her initial resection was completed on July 12, 2018 and then she underwent reexcision for positive margins on July 24, 2018.  Patient completed adjuvant XRT on October 16, 2018.  She then received consolidation chemotherapy with capecitabine. Imaging results reviewed independently which appears to be significant progression of disease in patient's liver.  Results from biopsy on Friday are pending at time of dictation.  Patient CA 27-29 is significantly elevated at 927.  Plan is to initiate palliative chemotherapy on Monday or Tuesday after Thanksgiving holiday.  Have also consulted palliative care. 2.  Fevers: No obvious source of infection.  Could be related to tumor.  Continue current antibiotics as ordered. 3.  Thrombocytopenia: Likely multifactorial given recent treatment of gemcitabine and metastatic disease in liver. 4.  Elevated liver enzymes: Secondary to infiltrative malignancy.  Monitor. 5.  Pulmonary nodules: Although appear to be infectious/laboratory, these are new since last imaging and given the appearance of liver on MRI highly suspicious for metastatic disease. 6.  Anxiety: Continue Xanax as needed. 7.  Pain: Continue current narcotic regimen.  Palliative care has been consulted as above. 8.  Disposition: Okay to discharge in 1 to 2 days from an oncology standpoint.  Follow-up early next week to initiate treatment.  Appreciate consult, will follow.  Lloyd Huger, MD   12/23/2018 10:47 AM

## 2018-12-23 NOTE — Progress Notes (Addendum)
Initial Nutrition Assessment  DOCUMENTATION CODES:   Not applicable  INTERVENTION:   -Ensure Enlive po BID, each supplement provides 350 kcal and 20 grams of protein.  NUTRITION DIAGNOSIS:   Increased nutrient needs related to cancer and cancer related treatments as evidenced by estimated needs.  GOAL:   Patient will meet greater than or equal to 90% of their needs  MONITOR:   PO intake, Supplement acceptance, Weight trends, Labs  REASON FOR ASSESSMENT:   Malnutrition Screening Tool   ASSESSMENT:   48 year old pt admitted with fever and abdominal pain with PMH of triple negative carcinoma of the inner, lower quadrant of left breast, liver mass and cirrhosis of liver. Pt was found to have atelectasis and inflammatory changes in her right lower lobe.   Pt was alert and sitting up in bed. Pt stated she has been eating well in the hospital but had been skipping meals at home since starting capecitabine about 2 months ago. Pt typically has a smoothie for breakfast consisting of greek yogurt, almond milk, flaxseeds and fruit at home, pt has been skipping lunch and dinner of grilled meat, and salad with [employer] and walnuts. Pt drinks water throughout the day. Discussed the importance of not skipping meals and for the pt to eat adequate protein and calories.   Pt stated her usual healthy wt is 150#. Charted wt from 10/15/18 is 67.1 kg. This is a 3% wt loss in 2 months which is not significant. Pt is eating >75% of meals.  Pt prefers vanilla Ensure.  Medications reviewed: Vitamin D3  Labs reviewed: glucoses 131, AST 223(H), ALT 138(H)  NUTRITION - FOCUSED PHYSICAL EXAM:    Most Recent Value  Orbital Region  No depletion  Upper Arm Region  No depletion  Thoracic and Lumbar Region  No depletion  Buccal Region  Mild depletion  Temple Region  Mild depletion  Clavicle Bone Region  Moderate depletion  Clavicle and Acromion Bone Region  Mild depletion  Scapular Bone Region  Mild  depletion  Dorsal Hand  No depletion  Patellar Region  No depletion  Anterior Thigh Region  No depletion  Posterior Calf Region  Mild depletion  Edema (RD Assessment)  Mild  Hair  Reviewed  Eyes  Reviewed  Mouth  Reviewed  Skin  Reviewed  Nails  Reviewed       Diet Order:   Diet Order            Diet Heart Room service appropriate? Yes; Fluid consistency: Thin  Diet effective now              EDUCATION NEEDS:   Education needs have been addressed  Skin:  Skin Assessment: Reviewed RN Assessment  Last BM:  11/19  Height:   Ht Readings from Last 1 Encounters:  12/21/18 5\' 6"  (1.676 m)    Weight:   Wt Readings from Last 1 Encounters:  12/21/18 64.9 kg    Ideal Body Weight:  59.1 kg  BMI:  Body mass index is 23.08 kg/m.  Estimated Nutritional Needs:   Kcal:  1700-1900  Protein:  85-95  Fluid:  1.7-1.9L  Allen Norris Dietetic Intern Pager # [phone]

## 2018-12-23 NOTE — Consult Note (Signed)
[place]  Telephone:([phone] [phone]   Name: Shelby [name] Date: 12/23/2018 MRN: [id number]  DOB: August 17, 1970  Patient Care Team: Jerrol Banana., MD as PCP - General (Family Medicine) [name], MD as Consulting Physician (Cardiology) Alfonzo Feller, RN as [place] [facility]    REASON FOR CONSULTATION: Palliative Care consult requested for this 48 y.o. female with multiple medical problems including progressive stage IV triple negative breast cancer who was recently hospitalized 12/18/2018-12/20/2018 with abdominal pain and fevers.  Imaging revealed probable hepatic metastases and patient underwent biopsy on 12/20/2018.  Patient was readmitted on 12/21/2018 again with acute right upper quadrant pain and fevers.  Palliative care was consulted to help address goals and manage ongoing symptoms.  SOCIAL HISTORY:     reports that she quit smoking about 11 years ago. Her smoking use included cigarettes. She has a 5.00 pack-year smoking history. She has never used smokeless tobacco. She reports current alcohol use. She reports that she does not use drugs.   Patient is divorced.  She lives at home with her 89 year old son and 46 year old son.  Her mother, who also has cancer recently moved in to help with her care.  Her sister is also been living there to help.  Patient is a [employer] of [employer].  ADVANCE DIRECTIVES:  Does not have  CODE STATUS: Full code  PAST MEDICAL HISTORY: Past Medical History:  Diagnosis Date   Anemia    h/o with pregnancy   Anxiety    Asthma    allergy induced-no inhaler   Cancer ([place])    breast left    Complication of anesthesia    Environmental allergies    Family history of adverse reaction to anesthesia    son-breathing problems-coded 1st time when he was 2 and had another surgery at 22 and had to be admitted for breathing  problems   Family history of breast cancer    GERD (gastroesophageal reflux disease)    Headache    migraines   Hypertension    Mitral valve disease    Mitral valve prolapse    Painful menstrual periods    PONV (postoperative nausea and vomiting)    Vertigo     PAST SURGICAL HISTORY:  Past Surgical History:  Procedure Laterality Date   ABDOMINAL HYSTERECTOMY  2010   supracervical    AXILLARY LYMPH NODE DISSECTION Left 07/12/2018   Procedure: AXILLARY LYMPH NODE DISSECTION;  Surgeon: Robert Bellow, MD;  Location: [facility] ORS;  Service: General;  Laterality: Left;   BREAST BIOPSY Left 11/2017   invasive ductal carcinoma and metastatic LN   BREAST BIOPSY WITH SENTINEL LYMPH NODE BIOPSY AND NEEDLE LOCALIZATION Left 07/12/2018   Procedure: BREAST BIOPSY WIDE EXCISION WITH SENTINEL NODE  AND NEEDLE LOCALIZATION OF OXILLARY NODS LEFT;  Surgeon: Robert Bellow, MD;  Location: [facility] ORS;  Service: General;  Laterality: Left;   BUNIONECTOMY Bilateral    MANDIBLE RECONSTRUCTION     age 89-underbite   PORTACATH PLACEMENT Right 12/31/2017   Procedure: INSERTION PORT-A-CATH;  Surgeon: Robert Bellow, MD;  Location: [facility] ORS;  Service: General;  Laterality: Right;   RE-EXCISION OF BREAST LUMPECTOMY Left 07/24/2018   Procedure: RE-EXCISION OF BREAST LUMPECTOMY LEFT;  Surgeon: Robert Bellow, MD;  Location: [facility] ORS;  Service: General;  Laterality: Left;   SHOULDER ARTHROSCOPY WITH OPEN ROTATOR CUFF REPAIR Right 04/26/2017   Procedure: SHOULDER ARTHROSCOPY  WITH OPEN ROTATOR CUFF REPAIR,SUBACROMINAL DECOMPRESSION;  Surgeon: Thornton Park, MD;  Location: [facility] ORS;  Service: Orthopedics;  Laterality: Right;   TONSILLECTOMY      HEMATOLOGY/ONCOLOGY HISTORY:  Oncology History Overview Note  Initially seen by nurse midwife Melody [place] at gynecologist office for left tender breast mass.  Had breast ultrasound and and mammogram revealing suspicious palpable mass of left  breast requiring biopsy.  Biopsy revealed grade 3 invasive ductal carcinoma.  Positive metastatic carcinoma and left axillary lymph node.  Met with Dr. Grayland Ormond on 12/24/2017 where he recommended neoadjuvant chemotherapy using Adriamycin, Cytoxan with Neulasta support followed by carbo/Taxol.  XRT may not be needed given patient would like mastectomy of left breast.   Had port placed by Dr. Bary Castilla on 12/31/2017.  CT abdomen/pelvis/chest with contrast on 01/02/2018 did not reveal metastatic disease.  Pretreatment MUGA revealed an EF of 71%.    Malignant neoplasm of female breast ([place])  12/23/2017 Initial Diagnosis   Primary cancer of lower-inner quadrant of left female breast ([place])   12/24/2017 Cancer Staging   Staging form: Breast, AJCC 8th Edition - Clinical stage from 12/24/2017: Stage IIIB (cT2, cN1, cM0, G3, ER-, PR-, HER2-) - Signed by Lloyd Huger, MD on 12/24/2017   01/03/2018 - 06/26/2018 Chemotherapy   The patient had DOXOrubicin (ADRIAMYCIN) chemo injection 104 mg, 60 mg/m2 = 104 mg, Intravenous,  Once, 4 of 4 cycles Administration: 104 mg (01/03/2018), 104 mg (01/17/2018), 104 mg (01/31/2018), 104 mg (02/14/2018) palonosetron (ALOXI) injection 0.25 mg, 0.25 mg, Intravenous,  Once, 8 of 8 cycles Administration: 0.25 mg (01/03/2018), 0.25 mg (02/28/2018), 0.25 mg (01/17/2018), 0.25 mg (03/21/2018), 0.25 mg (01/31/2018), 0.25 mg (02/14/2018), 0.25 mg (05/02/2018), 0.25 mg (05/30/2018) pegfilgrastim-cbqv (UDENYCA) injection 6 mg, 6 mg, Subcutaneous, Once, 6 of 6 cycles Administration: 6 mg (01/04/2018), 6 mg (01/18/2018), 6 mg (02/01/2018), 6 mg (02/15/2018), 6 mg (05/31/2018) CARBOplatin (PARAPLATIN) 680 mg in sodium chloride 0.9 % 250 mL chemo infusion, 680 mg (100 % of original dose 676.2 mg), Intravenous,  Once, 4 of 4 cycles Dose modification:   (original dose 676.2 mg, Cycle 5) Administration: 680 mg (02/28/2018), 680 mg (03/21/2018), 680 mg (05/02/2018), 680 mg (05/30/2018) cyclophosphamide  (CYTOXAN) 1,000 mg in sodium chloride 0.9 % 250 mL chemo infusion, 1,040 mg, Intravenous,  Once, 4 of 4 cycles Administration: 1,000 mg (01/03/2018), 1,000 mg (01/17/2018), 1,000 mg (01/31/2018), 1,000 mg (02/14/2018) PACLitaxel (TAXOL) 138 mg in sodium chloride 0.9 % 250 mL chemo infusion (</= 66m/m2), 80 mg/m2 = 138 mg, Intravenous,  Once, 4 of 4 cycles Administration: 138 mg (02/28/2018), 138 mg (03/07/2018), 138 mg (03/14/2018), 138 mg (03/21/2018), 138 mg (04/04/2018), 138 mg (04/18/2018), 138 mg (05/02/2018), 138 mg (05/16/2018), 138 mg (05/23/2018), 138 mg (05/30/2018), 138 mg (06/13/2018), 138 mg (06/20/2018) fosaprepitant (EMEND) 150 mg, dexamethasone (DECADRON) 12 mg in sodium chloride 0.9 % 145 mL IVPB, , Intravenous,  Once, 8 of 8 cycles Administration:  (01/03/2018),  (02/28/2018),  (01/17/2018),  (03/21/2018),  (01/31/2018),  (02/14/2018),  (05/02/2018),  (05/30/2018)  for chemotherapy treatment.    08/01/2018 Cancer Staging   Staging form: Breast, AJCC 8th Edition - Pathologic stage from 08/01/2018: No Stage Recommended (ypT1c, pN1a, cM0, ER-, PR-, HER2-) - Signed by FLloyd Huger MD on 08/01/2018   10/09/2018 - 11/24/2018 Chemotherapy   The patient had [No matching medication found in this treatment plan]  for chemotherapy treatment.    12/30/2018 -  Chemotherapy   The patient had eriBULin mesylate (HALAVEN) 1.9 mg in sodium chloride 0.9 %  100 mL chemo infusion, 1.1 mg/m2 = 1.9 mg (100 % of original dose 1.1 mg/m2), Intravenous,  Once, 0 of 4 cycles Dose modification: 1.1 mg/m2 (original dose 1.1 mg/m2, Cycle 1, Reason: Change in LFTs)  for chemotherapy treatment.      ALLERGIES:  is allergic to hydrocodone; iodine; peanut butter flavor; shellfish allergy; and vancomycin.  MEDICATIONS:  Current Facility-Administered Medications  Medication Dose Route Frequency Provider Last Rate Last Dose   ALPRAZolam Duanne Moron) tablet 0.25 mg  0.25 mg Oral BID PRN Para Skeans, MD   0.25 mg at 12/23/18 1033    cholecalciferol (VITAMIN D) tablet 2,000 Units  2,000 Units Oral QPM Para Skeans, MD   2,000 Units at 12/22/18 1752   diphenhydrAMINE (BENADRYL) capsule 25 mg  25 mg Oral Q4H PRN Swayze, Ava, DO       [START ON 12/24/2018] feeding supplement (ENSURE ENLIVE) (ENSURE ENLIVE) liquid 237 mL  237 mL Oral BID BM Swayze, Ava, DO       fluticasone (FLONASE) 50 MCG/ACT nasal spray 1 spray  1 spray Each Nare Daily Para Skeans, MD       HYDROmorphone (DILAUDID) injection 0.5 mg  0.5 mg Intravenous Q3H PRN Florina Ou V, MD   0.5 mg at 12/23/18 0555   ibuprofen (ADVIL) tablet 400 mg  400 mg Oral Q6H PRN Lang Snow, NP   400 mg at 12/23/18 0332   losartan (COZAAR) tablet 50 mg  50 mg Oral Daily Para Skeans, MD   50 mg at 12/22/18 1752   montelukast (SINGULAIR) tablet 10 mg  10 mg Oral QHS Para Skeans, MD       ondansetron [facility]) injection 4 mg  4 mg Intravenous Q6H PRN Lang Snow, NP   4 mg at 12/22/18 1601   oxyCODONE (Oxy IR/ROXICODONE) immediate release tablet 5 mg  5 mg Oral Q3H PRN Swayze, Ava, DO   5 mg at 12/22/18 1601   pantoprazole (PROTONIX) EC tablet 40 mg  40 mg Oral Daily Florina Ou V, MD   40 mg at 12/23/18 0939   sucralfate (CARAFATE) tablet 1 g  1 g Oral TID WC & HS Para Skeans, MD   1 g at 12/23/18 1305    VITAL SIGNS: BP 118/70    Pulse (!) 106    Temp 98.4 F (36.9 C) (Axillary)    Resp 16    Ht 5' 6" (1.676 m)    Wt 143 lb (64.9 kg)    SpO2 97%    BMI 23.08 kg/m  Filed Weights   12/21/18 0958  Weight: 143 lb (64.9 kg)    Estimated body mass index is 23.08 kg/m as calculated from the following:   Height as of this encounter: 5' 6" (1.676 m).   Weight as of this encounter: 143 lb (64.9 kg).  LABS: CBC:    Component Value Date/Time   WBC 5.4 12/23/2018 0414   HGB 10.2 (L) 12/23/2018 0414   HGB 11.8 08/13/2017 1556   HCT 29.8 (L) 12/23/2018 0414   HCT 35.0 08/13/2017 1556   PLT 45 (L) 12/23/2018 0414   PLT 198 08/13/2017 1556     MCV 95.5 12/23/2018 0414   MCV 90 08/13/2017 1556   MCV 92 11/25/2012 1542   NEUTROABS 2.2 12/19/2018 0557   NEUTROABS 8.8 (H) 08/13/2017 1556   NEUTROABS 4.2 11/25/2012 1542   LYMPHSABS 0.2 (L) 12/19/2018 0557   LYMPHSABS 0.4 (L) 08/13/2017 1556  LYMPHSABS 1.7 11/25/2012 1542   MONOABS 0.1 12/19/2018 0557   MONOABS 0.4 11/25/2012 1542   EOSABS 0.0 12/19/2018 0557   EOSABS 0.2 08/13/2017 1556   EOSABS 0.1 11/25/2012 1542   BASOSABS 0.0 12/19/2018 0557   BASOSABS 0.0 08/13/2017 1556   BASOSABS 0.1 11/25/2012 1542   Comprehensive Metabolic Panel:    Component Value Date/Time   NA 130 (L) 12/23/2018 0414   NA 137 06/22/2017 1448   NA 137 11/25/2012 1542   K 4.2 12/23/2018 0414   K 3.7 11/25/2012 1542   CL 95 (L) 12/23/2018 0414   CL 103 11/25/2012 1542   CO2 24 12/23/2018 0414   CO2 28 11/25/2012 1542   BUN 10 12/23/2018 0414   BUN 17 06/22/2017 1448   BUN 15 11/25/2012 1542   CREATININE 0.49 12/23/2018 0414   CREATININE 0.73 11/25/2012 1542   GLUCOSE 131 (H) 12/23/2018 0414   GLUCOSE 103 (H) 11/25/2012 1542   CALCIUM 8.2 (L) 12/23/2018 0414   CALCIUM 9.3 11/25/2012 1542   AST 223 (H) 12/23/2018 0414   AST 18 11/25/2012 1542   ALT 138 (H) 12/23/2018 0414   ALT 17 11/25/2012 1542   ALKPHOS 98 12/23/2018 0414   ALKPHOS 60 11/25/2012 1542   BILITOT 0.9 12/23/2018 0414   BILITOT 0.4 06/22/2017 1448   BILITOT 0.3 11/25/2012 1542   PROT 5.6 (L) 12/23/2018 0414   PROT 7.2 06/22/2017 1448   PROT 7.4 11/25/2012 1542   ALBUMIN 2.8 (L) 12/23/2018 0414   ALBUMIN 5.0 06/22/2017 1448   ALBUMIN 4.0 11/25/2012 1542    RADIOGRAPHIC STUDIES: Ct Abdomen Pelvis Wo Contrast  Addendum Date: 12/18/2018   ADDENDUM REPORT: 12/18/2018 20:18 ADDENDUM: These results were called by telephone on 12/18/2018 at 8:18 pm to provider [facility] , who verbally acknowledged these results. Electronically Signed   By: Lovena Le M.D.   On: 12/18/2018 20:18   Result Date:  12/18/2018 CLINICAL DATA:  Abdominal pain and distension, undergoing chemotherapy for breast cancer, history of IBS and diverticulitis EXAM: CT ABDOMEN AND PELVIS WITHOUT CONTRAST TECHNIQUE: Multidetector CT imaging of the abdomen and pelvis was performed following the standard protocol without IV contrast. COMPARISON:  CT abdomen pelvis 01/01/2018 FINDINGS: Lower chest: The patchy areas of subpleural ground-glass opacity could reflect atelectasis or early infection. Cardiac size is normal. Trace pericardial effusion, similar to prior. Hepatobiliary: Ill-defined regions of hypoattenuation are seen the anterior left lobe liver. Slightly nodular hepatic surface contour which is new from comparison exam. These features are both incompletely assessed in the absence of contrast media. Gallbladder appears largely contracted at the time of exam. No biliary ductal dilatation. Pancreas: Unremarkable. No pancreatic ductal dilatation or surrounding inflammatory changes. Spleen: Normal in size without focal abnormality. Adrenals/Urinary Tract: No suspicious adrenal lesions. No visible or contour deforming renal lesions. No urolithiasis or hydronephrosis mild circumferential bladder wall thickening. Stomach/Bowel: Distal esophagus, stomach and duodenal sweep are unremarkable. No small bowel wall thickening or dilatation. No evidence of obstruction. Appendix is not clearly identified. No pericecal inflammation. There is some mild mural thickening of the splenic flexure and descending colon remaining portions of the colon are free of mural thickening or dilatation. Vascular/Lymphatic: Upper abdominal venous collateralization and splenorenal shunting is noted. No suspicious or enlarged lymph nodes in the included lymphatic chains. Reproductive: Uterus is surgically absent. No concerning adnexal lesions. Other: Small volume of low-attenuation ascites noted in the low abdomen. Small volume of perihepatic low-attenuation ascites is  present as well. Musculoskeletal: Multilevel  degenerative changes are present in the imaged portions of the spine. No acute osseous abnormality or suspicious osseous lesion. IMPRESSION: 1. Interval development of cirrhotic stigmata including a nodular liver surface contour and increasing upper abdominal venous collateralization. Small volume of ascites is noted as well. 2. Ill-defined regions of hypoattenuation are seen within the anterior left lobe liver, which are incompletely assessed in the absence of contrast media. Furthermore findings are worrisome in the setting of known malignancy and potential intrinsic liver disease. Consider further evaluation with MRI or ultrasound if patient is unable to tolerate CT contrast due to allergy. 3. Mild mural thickening of the splenic flexure and descending colon, could reflect change related to the additional cirrhotic findings/portal colopathy though should exclude a an acute colitis clinically. 4. Patchy areas of subpleural ground-glass opacity in the visualized lung bases could reflect atelectasis or early infection. 5. Mild circumferential bladder wall thickening, which may be related to underdistention. Correlate with urinalysis to exclude cystitis. 6. Unchanged trace pericardial effusion. Electronically Signed: By: Lovena Le M.D. On: 12/18/2018 20:07   Dg Chest 1 View  Result Date: 12/23/2018 CLINICAL DATA:  Cirrhosis, ascites, SIRS, history LEFT breast cancer, hypertension EXAM: CHEST  1 VIEW COMPARISON:  Portable exam 1016 hours compared to 12/21/2018 FINDINGS: RIGHT subclavian Port-A-Cath with tip projecting over superior RIGHT atrium, unchanged. Normal heart size, mediastinal contours, and pulmonary vascularity. Bibasilar atelectasis. Remaining lungs clear. No pleural effusion or pneumothorax. IMPRESSION: Bibasilar atelectasis. Electronically Signed   By: Lavonia Dana M.D.   On: 12/23/2018 10:58   Ct Angio Chest Pe W And/or Wo Contrast  Result Date:  12/21/2018 CLINICAL DATA:  48 y.o. female discharged yesterday for concerns of abdominal pain with a work-up regarding her liver as well as she is meeting SIRS criteria with feverPatient comes back today reports she continues to have significant pain and discomfort. She has been having ongoing and severe pain located in the mid to right upper abdomen, also she reports fairly significant pain when she takes a deep breath over the right lower lungShe has been feeling warm had a fever when she was in the hospital yesterday. Some nausea. No headaches. Tested negative for Covid a few days ago pain is located primarily in the right upper abdomen under the right rib cage. EXAM: CT ANGIOGRAPHY CHEST CT ABDOMEN AND PELVIS WITH CONTRAST TECHNIQUE: Multidetector CT imaging of the chest was performed using the standard protocol during bolus administration of intravenous contrast. Multiplanar CT image reconstructions and MIPs were obtained to evaluate the vascular anatomy. Multidetector CT imaging of the abdomen and pelvis was performed using the standard protocol during bolus administration of intravenous contrast. CONTRAST:  30m OMNIPAQUE IOHEXOL 350 MG/ML SOLN COMPARISON:  Chest CTA and abdomen and pelvis CT dated 12/19/2018. FINDINGS: CTA CHEST FINDINGS Cardiovascular: There is satisfactory opacification of the pulmonary arteries to the segmental level. No evidence of a pulmonary embolism. Mediastinum/Nodes: No enlarged mediastinal, hilar, or axillary lymph nodes. Thyroid gland, trachea, and esophagus demonstrate no significant findings. Lungs/Pleura: Small bilateral pleural effusions. There is dependent opacity in the lower lobes consistent with atelectasis. Small areas of ground-glass opacity and small lung nodules noted. Dominant nodule in the right upper lobe, image 46, series 4, 6 mm. Several other small nodules are new from the prior CT. Area of ground-glass opacity adjacent to the oblique fissure in the left upper  lobe is new. No pneumothorax. Musculoskeletal: No chest wall abnormality. No acute or significant osseous findings. Review of the MIP images confirms  the above findings. CT ABDOMEN and PELVIS FINDINGS Hepatobiliary: Enlarged liver. There are multiple hypoattenuating areas throughout the liver. These areas are more apparent and more extensive than on the recent prior CT, particularly evident at the dome of segment 4A, along the anterior aspect of the right lobe and medial segment of the left lobe and along the inferior aspect of the right lobe, segment 5 and 6. Gallbladder is mostly collapsed. There is increased attenuation material within the gallbladder similar to the prior CT. No bile duct dilation Pancreas: Unremarkable. No pancreatic ductal dilatation or surrounding inflammatory changes. Spleen: Normal in size without focal abnormality. Adrenals/Urinary Tract: No adrenal masses. Right kidney displaced inferiorly by the enlarged liver. Symmetric renal enhancement and excretion. No renal masses, stones or hydronephrosis. Normal ureters. Bladder wall is prominent, unchanged from the recent prior study. No bladder masses. Stomach/Bowel: Stomach is unremarkable, mostly decompressed. Small bowel and colon are normal in caliber. No wall thickening or inflammation. Appendix not discretely seen. No findings of appendicitis. Vascular/Lymphatic: As noted on the prior CT, left portal vein is attenuated, but does show enhancement. No evidence of thrombosis. There are venous collaterals in the upper abdomen. Aorta is unremarkable. No enlarged lymph nodes. Prominent Peri celiac node noted on the prior study is stable. Reproductive: Status post hysterectomy. No adnexal masses. Other: Small amount of ascites increased when compared to the prior CT. Musculoskeletal: No acute or significant osseous findings. Review of the MIP images confirms the above findings. IMPRESSION: CHEST CTA 1. No evidence of a pulmonary embolism. 2. Small  pleural effusions, new from the prior CT. There is increased opacity in the lower lobes that is consistent with atelectasis. There several new small nodules and small areas of ground-glass opacity when compared to the prior CT. Suspect these are inflammatory given change from the recent exam. ABDOMEN AND PELVIS CT 1. Multiple hypoattenuating liver lesions. These appear to have progressed when compared to the prior CT, although this change could be due to differences in contrast timing. The liver is enlarged, stable from the recent exam. Findings are concerning for multifocal hepatocellular carcinoma. Follow-up liver MRI without and with contrast, when the patient can tolerate the procedure, is recommended. 2. Small amount ascites has increased in quantity from the prior study. 3. There are vascular collaterals in the upper abdomen, stable. Electronically Signed   By: Lajean Manes M.D.   On: 12/21/2018 18:36   Ct Angio Chest Pe W And/or Wo Contrast  Result Date: 12/19/2018 CLINICAL DATA:  Chest pain, complex, intermediate/high prob of ACS/PE/AAS; Abd distension Epigastric pain. Exertional dyspnea. Cough. Weakness. History of breast cancer. EXAM: CT ANGIOGRAPHY CHEST CT ABDOMEN AND PELVIS WITH CONTRAST TECHNIQUE: Multidetector CT imaging of the chest was performed using the standard protocol during bolus administration of intravenous contrast. Multiplanar CT image reconstructions and MIPs were obtained to evaluate the vascular anatomy. Multidetector CT imaging of the abdomen and pelvis was performed using the standard protocol during bolus administration of intravenous contrast. No reported contrast reaction. CONTRAST:  139m OMNIPAQUE IOHEXOL 350 MG/ML SOLN COMPARISON:  Abdominal CT without contrast yesterday. Chest CT 01/01/2018 FINDINGS: CTA CHEST FINDINGS Cardiovascular: There are no filling defects within the pulmonary arteries to suggest pulmonary embolus. Thoracic aorta is normal in caliber without  dissection. Left vertebral artery arises directly from the thoracic aorta, variant arch anatomy. Heart is normal in size. No pericardial effusion. Right chest port in place with tip in the SVC. Mediastinum/Nodes: No mediastinal or hilar adenopathy. No visualized thyroid nodule.  No esophageal wall thickening. Lungs/Pleura: Mild patchy areas of subpleural nodular and ground-glass opacities. Basilar findings are unchanged from CT yesterday. 5 mm right upper lobe pulmonary nodule series 6, image 41. Sub solid nodularity slightly more inferiorly in the right lung series 6, image 45 measuring 8 mm. Tiny 2 mm nodule in the right upper lung series 6, image 32, may be fissural. Pulmonary nodules are new from prior chest CT. Perifissural in subpleural opacity in the right upper lobe, also series 6, image 32, nonspecific. No pulmonary edema. No pleural fluid. Trachea and mainstem bronchi are patent. Musculoskeletal: There are no acute or suspicious osseous abnormalities. Review of the MIP images confirms the above findings. CT ABDOMEN and PELVIS FINDINGS Hepatobiliary: Hepatic parenchyma diffusely heterogeneous with nodular contours. Liver is increased in size from December 2019 CT. Gallbladder is decompressed and not well assessed, intraluminal high density may be stones or sludge. Questionable wall thickening may be related to nondistention. Pancreas: No ductal dilatation or inflammation. Spleen: Normal in size without focal abnormality. Adrenals/Urinary Tract: Normal adrenal glands. No hydronephrosis or perinephric edema. Homogeneous renal enhancement with symmetric excretion on delayed phase imaging. Urinary bladder is partially distended, mild bladder wall thickening. Stomach/Bowel: Lack of enteric contrast and paucity of intra-abdominal fat limits detailed bowel assessment. The stomach is nondistended. Questionable pre-pyloric gastric wall thickening. No small bowel obstruction or inflammatory change. Appendix not  discretely visualized, no pericecal inflammation to suggest appendicitis. Transverse colon is tortuous. Mild colonic wall thickening at the splenic flexure, colon is decompressed, this is not well assessed. Questionable areas of descending colonic wall thickening, for example series 11, image 61. Vascular/Lymphatic: Right portal vein is patent. The left portal vein is attenuated without evidence of thrombus. Tortuous splenic vein with prominent collaterals in the left upper quadrant and left retroperitoneum. 9 mm perigastric lymph node series 11, image 29, nonspecific. No pelvic adenopathy. Reproductive: Uterus surgically absent. Left ovary tentatively visualized and normal. Right ovary not definitively seen. No obvious adnexal mass. Other: Small volume of pelvic ascites measuring simple fluid density, similar to yesterday. Small perihepatic ascites anteriorly. No free air. Musculoskeletal: There are no acute or suspicious osseous abnormalities. Scattered bone islands in the pelvis. Review of the MIP images confirms the above findings. IMPRESSION: CT chest: 1. No pulmonary embolus. 2. Mild patchy areas of subpleural nodular and ground-glass opacities in both lungs. Slightly more confluent subpleural/perifissural right upper lobe opacity. Findings may be due to atelectasis or infection. 3. Small pulmonary nodules are new from December, infectious/inflammatory or metastatic. CT abdomen/pelvis: 1. Diffusely heterogeneous liver with nodular contours. Liver has increased in size since December 2019 CT. This may be due to underlying cirrhotic change versus diffuse metastatic disease. Recommend further evaluation with abdominal MRI. Left upper quadrant and retroperitoneal collaterals can be seen with portal hypertension. Left portal vein is attenuated, however no discrete thrombus. 2. Questionable pre-pyloric gastric wall thickening, peptic ulcer disease versus gastritis. 3. Bladder wall thickening, small volume  abdominopelvic ascites, and areas of colonic wall thickening at the splenic flexure and descending colon are unchanged from CT yesterday. 4. Gallbladder is decompressed and not well assessed, intraluminal high density may be stones or sludge. Questionable gallbladder wall thickening may be related to nondistention or liver disease. This could be further evaluated with MRI or right upper quadrant ultrasound. Electronically Signed   By: Keith Rake M.D.   On: 12/19/2018 05:33   Mr Abdomen W Wo Contrast  Result Date: 12/19/2018 CLINICAL DATA:  Abdominal pain in a patient  with breast cancer. EXAM: MRI ABDOMEN WITHOUT AND WITH CONTRAST TECHNIQUE: Multiplanar multisequence MR imaging of the abdomen was performed both before and after the administration of intravenous contrast. CONTRAST:  30m GADAVIST GADOBUTROL 1 MMOL/ML IV SOLN COMPARISON:  CT of 12/19/2018 FINDINGS: Lower chest: Limited assessment of the lower chest on MRI is unremarkable. Hepatobiliary: Diffuse infiltration of much of the liver, nearly the entire left hepatic lobe, medial section and lateral section in addition to anterior right hepatic lobe with confluent infiltrative restricted diffusion and heterogeneous enhancement measuring approximately 21 by 7.4 cm. Diffuse metastatic disease is strongly suspected. Boundaries are well-demarcated without adjacent edema in the bordering hepatic parenchyma Discrete hepatic lesions are also scattered about the right hepatic lobe largest in the dome of the right hemi liver measuring approximately 2.5 cm in greatest dimension. These discrete lesions display a targetoid appearance. There are numerous lesions in the right hepatic lobe, lesions are found in all hepatic subsegment. Pancreas: No mass, inflammatory changes, or other parenchymal abnormality identified. Mild ductal distension in the tail of the pancreas may be due to adjacent mass effect. No visible pancreatic lesion is noted. Spleen:  Within normal  limits in size and appearance. Adrenals/Urinary Tract: No masses identified. No evidence of hydronephrosis. Stomach/Bowel: Limited assessment of the gastrointestinal tract without signs of acute process. Vascular/Lymphatic: Left-sided peri-renal venous collaterals of uncertain significance perhaps related to elevated portal pressures in the setting of diffuse hepatic involvement. 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 Right portal vein is patent. Scattered small lymph nodes in the upper abdomen. There is either slow flow or thrombus within the main portal vein at the level of the splenic portal confluence extending in the splenic vein. The SMV is patent. There is mass effect upon the vessel at the level of the splenic portal confluence due to hepatic enlargement. This is best seen on subtraction images, image 54, series 20. Note that slow flow in an otherwise opacified vessel can cause artifact with a similar appearance however, this is seen on contrasted and non contrasted imaging with a similar appearance. Other:  None. Musculoskeletal: No signs of acute or destructive bone process. IMPRESSION: 1. Diffuse infiltration of much of the liver, nearly the entire left hepatic lobe, with confluent infiltrative restricted diffusion and heterogeneous enhancement measuring approximately 21 by 7.4 cm. Diffuse metastatic disease is strongly suspected with multifocal lesions in the right hepatic lobe. Superimposed infection is difficult to exclude though areas at the boundary of these lesions show no overt signs of edema. 2. Highly narrowed or occluded left portal vein with potential thrombus versus slow flow in the splenoportal confluence. This was patent on the exam of 12/19/2018; however, given above findings a venous phase CT or even ultrasound hepatic Doppler with special attention to the splenic portal confluence may be helpful to exclude this possibility, finding is also exhibited on TrueFISP sequence (image 24, series 10).  3. 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 4. Mild ductal distension in the tail of the pancreas, potentially due to mass effect. Attention on follow-up. 5. Critical Value/emergent results were called by telephone at the time of interpretation on 12/19/2018 at 6:43 pm to [place] who verbally acknowledged these results. Electronically Signed   By: GZetta BillsM.D.   On: 12/19/2018 18:24   [place]dom[place] Complete  Result Date: 12/19/2018 CLINICAL DATA:  Abdominal pain EXAM: ABDOMEN ULTRASOUND COMPLETE COMPARISON:  MRI earlier today FINDINGS:  Gallbladder: Gallbladder is contracted with associated mild wall thickening measuring up to 4 mm. Sludge noted within the gallbladder. No visible stones. Common bile duct: Diameter: Normal caliber, 3 mm Liver: Diffusely heterogeneous echotexture throughout the liver corresponding to the abnormality seen on today's MRI suspicious forinfiltrating mass. Main portal vein is patent on color Doppler imaging with normal direction of blood flow towards the liver. The area of possible thrombus seen on MRI at the level of the splenic portal confluence not visualized. IVC: No abnormality visualized. Pancreas: Visualized portion unremarkable. Spleen: Size and appearance within normal limits. Right Kidney: Length: 11.0 cm. Echogenicity within normal limits. No mass or hydronephrosis visualized. Left Kidney: Length: 10.5 cm. Echogenicity within normal limits. No mass or hydronephrosis visualized. Abdominal aorta: No aneurysm visualized. Other findings: None. IMPRESSION: Heterogeneous appearance throughout much of the left hepatic lobe as seen on today's MRI concerning for infiltrating mass/tumor. Main portal vein is patent. Contracted gallbladder with gallbladder wall thickening likely related to contracted state. Sludge within the gallbladder. No sonographic Murphy sign or  visible stones. Electronically Signed   By: Rolm Baptise M.D.   On: 12/19/2018 20:35   Ct Abdomen Pelvis W Contrast  Result Date: 12/21/2018 CLINICAL DATA:  48 y.o. female discharged yesterday for concerns of abdominal pain with a work-up regarding her liver as well as she is meeting SIRS criteria with feverPatient comes back today reports she continues to have significant pain and discomfort. She has been having ongoing and severe pain located in the mid to right upper abdomen, also she reports fairly significant pain when she takes a deep breath over the right lower lungShe has been feeling warm had a fever when she was in the hospital yesterday. Some nausea. No headaches. Tested negative for Covid a few days ago pain is located primarily in the right upper abdomen under the right rib cage. EXAM: CT ANGIOGRAPHY CHEST CT ABDOMEN AND PELVIS WITH CONTRAST TECHNIQUE: Multidetector CT imaging of the chest was performed using the standard protocol during bolus administration of intravenous contrast. Multiplanar CT image reconstructions and MIPs were obtained to evaluate the vascular anatomy. Multidetector CT imaging of the abdomen and pelvis was performed using the standard protocol during bolus administration of intravenous contrast. CONTRAST:  51m OMNIPAQUE IOHEXOL 350 MG/ML SOLN COMPARISON:  Chest CTA and abdomen and pelvis CT dated 12/19/2018. FINDINGS: CTA CHEST FINDINGS Cardiovascular: There is satisfactory opacification of the pulmonary arteries to the segmental level. No evidence of a pulmonary embolism. Mediastinum/Nodes: No enlarged mediastinal, hilar, or axillary lymph nodes. Thyroid gland, trachea, and esophagus demonstrate no significant findings. Lungs/Pleura: Small bilateral pleural effusions. There is dependent opacity in the lower lobes consistent with atelectasis. Small areas of ground-glass opacity and small lung nodules noted. Dominant nodule in the right upper lobe, image 46, series 4, 6 mm.  Several other small nodules are new from the prior CT. Area of ground-glass opacity adjacent to the oblique fissure in the left upper lobe is new. No pneumothorax. Musculoskeletal: No chest wall abnormality. No acute or significant osseous findings. Review of the MIP images confirms the above findings. CT ABDOMEN and PELVIS FINDINGS Hepatobiliary: Enlarged liver. There are multiple hypoattenuating areas throughout the liver. These areas are more apparent and more extensive than on the recent prior CT, particularly evident at the dome of segment 4A, along the anterior aspect of the right lobe and medial segment of the left lobe and along the inferior aspect of the right lobe, segment 5 and 6. Gallbladder is mostly  collapsed. There is increased attenuation material within the gallbladder similar to the prior CT. No bile duct dilation Pancreas: Unremarkable. No pancreatic ductal dilatation or surrounding inflammatory changes. Spleen: Normal in size without focal abnormality. Adrenals/Urinary Tract: No adrenal masses. Right kidney displaced inferiorly by the enlarged liver. Symmetric renal enhancement and excretion. No renal masses, stones or hydronephrosis. Normal ureters. Bladder wall is prominent, unchanged from the recent prior study. No bladder masses. Stomach/Bowel: Stomach is unremarkable, mostly decompressed. Small bowel and colon are normal in caliber. No wall thickening or inflammation. Appendix not discretely seen. No findings of appendicitis. Vascular/Lymphatic: As noted on the prior CT, left portal vein is attenuated, but does show enhancement. No evidence of thrombosis. There are venous collaterals in the upper abdomen. Aorta is unremarkable. No enlarged lymph nodes. Prominent Peri celiac node noted on the prior study is stable. Reproductive: Status post hysterectomy. No adnexal masses. Other: Small amount of ascites increased when compared to the prior CT. Musculoskeletal: No acute or significant osseous  findings. Review of the MIP images confirms the above findings. IMPRESSION: CHEST CTA 1. No evidence of a pulmonary embolism. 2. Small pleural effusions, new from the prior CT. There is increased opacity in the lower lobes that is consistent with atelectasis. There several new small nodules and small areas of ground-glass opacity when compared to the prior CT. Suspect these are inflammatory given change from the recent exam. ABDOMEN AND PELVIS CT 1. Multiple hypoattenuating liver lesions. These appear to have progressed when compared to the prior CT, although this change could be due to differences in contrast timing. The liver is enlarged, stable from the recent exam. Findings are concerning for multifocal hepatocellular carcinoma. Follow-up liver MRI without and with contrast, when the patient can tolerate the procedure, is recommended. 2. Small amount ascites has increased in quantity from the prior study. 3. There are vascular collaterals in the upper abdomen, stable. Electronically Signed   By: Lajean Manes M.D.   On: 12/21/2018 18:36   Ct Abdomen Pelvis W Contrast  Result Date: 12/19/2018 CLINICAL DATA:  Chest pain, complex, intermediate/high prob of ACS/PE/AAS; Abd distension Epigastric pain. Exertional dyspnea. Cough. Weakness. History of breast cancer. EXAM: CT ANGIOGRAPHY CHEST CT ABDOMEN AND PELVIS WITH CONTRAST TECHNIQUE: Multidetector CT imaging of the chest was performed using the standard protocol during bolus administration of intravenous contrast. Multiplanar CT image reconstructions and MIPs were obtained to evaluate the vascular anatomy. Multidetector CT imaging of the abdomen and pelvis was performed using the standard protocol during bolus administration of intravenous contrast. No reported contrast reaction. CONTRAST:  174m OMNIPAQUE IOHEXOL 350 MG/ML SOLN COMPARISON:  Abdominal CT without contrast yesterday. Chest CT 01/01/2018 FINDINGS: CTA CHEST FINDINGS Cardiovascular: There are no  filling defects within the pulmonary arteries to suggest pulmonary embolus. Thoracic aorta is normal in caliber without dissection. Left vertebral artery arises directly from the thoracic aorta, variant arch anatomy. Heart is normal in size. No pericardial effusion. Right chest port in place with tip in the SVC. Mediastinum/Nodes: No mediastinal or hilar adenopathy. No visualized thyroid nodule. No esophageal wall thickening. Lungs/Pleura: Mild patchy areas of subpleural nodular and ground-glass opacities. Basilar findings are unchanged from CT yesterday. 5 mm right upper lobe pulmonary nodule series 6, image 41. Sub solid nodularity slightly more inferiorly in the right lung series 6, image 45 measuring 8 mm. Tiny 2 mm nodule in the right upper lung series 6, image 32, may be fissural. Pulmonary nodules are new from prior chest CT. Perifissural in  subpleural opacity in the right upper lobe, also series 6, image 32, nonspecific. No pulmonary edema. No pleural fluid. Trachea and mainstem bronchi are patent. Musculoskeletal: There are no acute or suspicious osseous abnormalities. Review of the MIP images confirms the above findings. CT ABDOMEN and PELVIS FINDINGS Hepatobiliary: Hepatic parenchyma diffusely heterogeneous with nodular contours. Liver is increased in size from December 2019 CT. Gallbladder is decompressed and not well assessed, intraluminal high density may be stones or sludge. Questionable wall thickening may be related to nondistention. Pancreas: No ductal dilatation or inflammation. Spleen: Normal in size without focal abnormality. Adrenals/Urinary Tract: Normal adrenal glands. No hydronephrosis or perinephric edema. Homogeneous renal enhancement with symmetric excretion on delayed phase imaging. Urinary bladder is partially distended, mild bladder wall thickening. Stomach/Bowel: Lack of enteric contrast and paucity of intra-abdominal fat limits detailed bowel assessment. The stomach is nondistended.  Questionable pre-pyloric gastric wall thickening. No small bowel obstruction or inflammatory change. Appendix not discretely visualized, no pericecal inflammation to suggest appendicitis. Transverse colon is tortuous. Mild colonic wall thickening at the splenic flexure, colon is decompressed, this is not well assessed. Questionable areas of descending colonic wall thickening, for example series 11, image 61. Vascular/Lymphatic: Right portal vein is patent. The left portal vein is attenuated without evidence of thrombus. Tortuous splenic vein with prominent collaterals in the left upper quadrant and left retroperitoneum. 9 mm perigastric lymph node series 11, image 29, nonspecific. No pelvic adenopathy. Reproductive: Uterus surgically absent. Left ovary tentatively visualized and normal. Right ovary not definitively seen. No obvious adnexal mass. Other: Small volume of pelvic ascites measuring simple fluid density, similar to yesterday. Small perihepatic ascites anteriorly. No free air. Musculoskeletal: There are no acute or suspicious osseous abnormalities. Scattered bone islands in the pelvis. Review of the MIP images confirms the above findings. IMPRESSION: CT chest: 1. No pulmonary embolus. 2. Mild patchy areas of subpleural nodular and ground-glass opacities in both lungs. Slightly more confluent subpleural/perifissural right upper lobe opacity. Findings may be due to atelectasis or infection. 3. Small pulmonary nodules are new from December, infectious/inflammatory or metastatic. CT abdomen/pelvis: 1. Diffusely heterogeneous liver with nodular contours. Liver has increased in size since December 2019 CT. This may be due to underlying cirrhotic change versus diffuse metastatic disease. Recommend further evaluation with abdominal MRI. Left upper quadrant and retroperitoneal collaterals can be seen with portal hypertension. Left portal vein is attenuated, however no discrete thrombus. 2. Questionable pre-pyloric  gastric wall thickening, peptic ulcer disease versus gastritis. 3. Bladder wall thickening, small volume abdominopelvic ascites, and areas of colonic wall thickening at the splenic flexure and descending colon are unchanged from CT yesterday. 4. Gallbladder is decompressed and not well assessed, intraluminal high density may be stones or sludge. Questionable gallbladder wall thickening may be related to nondistention or liver disease. This could be further evaluated with MRI or right upper quadrant ultrasound. Electronically Signed   By: Keith Rake M.D.   On: 12/19/2018 05:33   US Biopsy (liver)  Result Date: 12/20/2018 INDICATION: History of left breast carcinoma with imaging evidence of probable diffuse metastatic disease in the liver, more prominently in the left lobe. The patient presents for biopsy. EXAM: ULTRASOUND GUIDED CORE BIOPSY OF LIVER MEDICATIONS: None. ANESTHESIA/SEDATION: Fentanyl 100 mcg IV; Versed 2.0 mg IV Moderate Sedation Time:  20 minutes. The patient was continuously monitored during the procedure by the interventional radiology nurse under my direct supervision. PROCEDURE: The procedure, risks, benefits, and alternatives were explained to the patient. Questions regarding the procedure  were encouraged and answered. The patient understands and consents to the procedure. A time-out was performed prior to initiating the procedure. Ultrasound was used to localize lesions within the liver. The anterior abdominal wall was prepped with chlorhexidine in a sterile fashion, and a sterile drape was applied covering the operative field. A sterile gown and sterile gloves were used for the procedure. Local anesthesia was provided with 1% Lidocaine. Under direct ultrasound guidance, a 17 gauge trocar needle was advanced into the left lobe of the liver. After confirming needle tip position, coaxial 18 gauge core biopsy samples were obtained. A total of 3 samples were obtained and submitted in  formalin. A slurry of Gel-Foam pledgets was then slowly injected via the outer needle as the needle was retracted. Additional ultrasound was performed. COMPLICATIONS: None immediate. FINDINGS: Ultrasound demonstrates heterogeneous appearance of the liver with ill-defined lesions throughout the left lobe. Solid core biopsy samples were obtained by sampling lesions within the lateral segment of the left lobe. IMPRESSION: Ultrasound-guided core biopsy performed of hepatic lesions within the left lobe. Electronically Signed   By: Aletta Edouard M.D.   On: 12/20/2018 13:44   Dg Chest Portable 1 View  Result Date: 12/21/2018 CLINICAL DATA:  Pleuritic right chest pain EXAM: PORTABLE CHEST 1 VIEW COMPARISON:  12/18/2018 chest radiograph. FINDINGS: Stable right subclavian Port-A-Cath terminating over the right atrium. Stable cardiomediastinal silhouette with normal heart size. No pneumothorax. No pleural effusion. No pulmonary edema. Curvilinear left lung base opacities. No acute consolidative airspace disease. IMPRESSION: Curvilinear left lung base opacities, favor scarring or atelectasis. No acute consolidative airspace disease. Electronically Signed   By: Ilona Sorrel M.D.   On: 12/21/2018 13:06   Dg Chest Portable 1 View  Result Date: 12/18/2018 CLINICAL DATA:  Fever abdominal pain EXAM: PORTABLE CHEST 1 VIEW COMPARISON:  12/31/2017 FINDINGS: Right-sided central venous port with tip over the cavoatrial region. No focal airspace disease or effusion. Normal heart size. No pneumothorax. IMPRESSION: No active disease. Electronically Signed   By: Donavan Foil M.D.   On: 12/18/2018 19:49    PERFORMANCE STATUS (ECOG) : 1 - Symptomatic but completely ambulatory  Review of Systems Unless otherwise noted, a complete review of systems is negative.  Physical Exam General: NAD, frail appearing, thin Pulmonary: Unlabored, on O2 Extremities: no edema, no joint deformities Skin: no rashes Neurological: Weakness  but otherwise nonfocal  IMPRESSION: I met with patient to discuss goals.  Introduced palliative care services and attempted establish therapeutic rapport.  Patient reports that she is emotional following the news that her cancer has progressed.  She spoke earlier today with Dr. Grayland Ormond.  Biopsy results are pending but patient seems to recognize that she likely now has stage IV disease.  She does intend to pursue treatment and says that she wants to "fight this."  I expressed that we will support her through her decision-making and treatment process.  Patient is worried about her 1 year old son who is not aware of the extent of her cancer.  We did discuss her emotional coping and emotional support was provided.  We discussed advance care planning.  Patient does not have any ACP documents established.  She says that she would want her sister to be her decision-maker if necessary.  She would not want to burden her 47 year old son with decision-making.  Will consult the chaplain to help establish ACP documents.  I introduced the idea of a MOST Form, which we can plan to complete in the outpatient setting.  PLAN: -Continue  current scope of treatment -Chaplain to help with ACP documents -Will plan to follow in the outpatient clinic   Time Total: 30 minutes  Visit consisted of counseling and education dealing with the complex and emotionally intense issues of symptom management and palliative care in the setting of serious and potentially life-threatening illness.Greater than 50%  of this time was spent counseling and coordinating care related to the above assessment and plan.  Signed by: Altha Harm, PhD, NP-C

## 2018-12-23 NOTE — Progress Notes (Signed)
DISCONTINUE ON PATHWAY REGIMEN - Breast     A cycle is every 21 days:     Capecitabine   **Always confirm dose/schedule in your pharmacy ordering system**  REASON: Disease Progression PRIOR TREATMENT: [id number]: Capecitabine 1,250 mg/m2 BID D1-14 q21 Days x 8 Cycles TREATMENT RESPONSE: Progressive Disease (PD)  START ON PATHWAY REGIMEN - Breast     A cycle is every 21 days:     Eribulin mesylate   **Always confirm dose/schedule in your pharmacy ordering system**  Patient Characteristics: Distant Metastases or Locoregional Recurrent Disease - Unresected or Locally Advanced Unresectable Disease Progressing after Neoadjuvant and Local Therapies, HER2 Negative/Unknown/Equivocal, ER Negative/Unknown, Chemotherapy, Second Line Therapeutic Status: Distant Metastases BRCA Mutation Status: Absent ER Status: Negative (-) HER2 Status: Negative (-) PR Status: Negative (-) Line of Therapy: Second Line Intent of Therapy: Non-Curative / Palliative Intent, Discussed with Patient

## 2018-12-23 NOTE — Telephone Encounter (Signed)
Patient's sister called and stated that when Dr. Grayland Ormond goes to speak with patient regarding biopsy results, pt would like for her sister to be there. Please call either pt or sister when Dr. Grayland Ormond will be going to see patient.

## 2018-12-23 NOTE — Consult Note (Signed)
NAME: Shelby [name]  DOB: Feb 09, 1970  MRN: [id number]  Date/Time: 12/23/2018 5:58 PM  REQUESTING PROVIDER: Dr. Benny Lennert Subjective:  REASON FOR CONSULT: Fever following liver biopsy Chart reviewed in great detail.  History from patient as well ? Shelby [name] is a 48 y.o. female with a history of Stage IV triple negative breast cancer is admitted with fever and abdominal pain of >1 week duration. She was recently  hospitalized between 11/18-11/20 with abdominal pain and fever. Imaging showed multiple lesions liver and she underwent biopsy on 12/20/18.She was discharged home the same day only to present with worsening fever and abdominal pain. Patient history is as follows She was diagnosed with stage IIIb breast cancer on the left side and Nov 2019.PORT placed 12/31/17  Patient completed her neoadjuvant chemotherapy with Adriamycin and Cytoxan, followed by carboplatinum and Taxol, on Jun 20, 2018. her initial resection was completed on July 12, 2018 and then she underwent reexcision for positive margins on July 24, 2018.  Patient completed adjuvant XRT on October 16, 2018.  She is now on adjuvant capecitabine  twice daily for 14 days with 7 days off.  .  She has completed cycle 2 of capecitabine. She was seen at her oncologist office on 11/6 with fatigue and weakness. Her symptoms started since she went on Capecitabine. On 11/17 she had virtual visit with her PCP c/o fever and abdominal pain. On 11/18 she came to the ED , temp was 99.4, BP 149/95, RR 18. Labs revealed WBC of 3.6 hemoglobin 9.5 platelets 46 creatinine 1.7 LFTs were showing AST of 188 ALT 119 increased from baseline chest x-ray was unremarkable COVID-19 test is pending.  EKG shows normal sinus rhythm.  CT abdomen pelvis  showsed features concerning for cirrhosis of the liver and hypoattenuation lesions in the liver   MRI showed Diffuse infiltration of much of the liver, nearly the entire left hepatic lobe, medial section and  lateral section in addition to anterior right hepatic lobe with confluent infiltrative estricted diffusion and heterogeneous enhancement measuring approximately 21 by 7.4 cm. Discrete hepatic lesions are also scattered about the right hepatic lobe largest in the dome of the right hemi liver measuring approximately 2.5 cm in greatest dimension. These discrete lesions display a targetoid appearance. There are numerous lesions in the right hepatic lobe, lesions are found in all hepatic subsegment. There is either slow flow or thrombus within the main portal vein at the level of the splenic portal confluence extending in the splenic Vein. Had liver biopsy on 12/20/18. Was discharged on 11/20, back again with severe abdominal pain and concern for possible bleeding due to low platelet Pathology confirmed liver mets. Patient says she gets fever mostly at nighttime No cough, no shortness of breath, no nausea or vomiting.  Constipated.  No tooth extraction, no sexually transmitted diseases, no eating of raw seafood,.  No recent travel history.  Had traveled to [place] on a cruise couple of years ago. Lives with her 2 sons and her mom is there to help. Past Medical History:  Diagnosis Date  . Anemia    h/o with pregnancy  . Anxiety   . Asthma    allergy induced-no inhaler  . Cancer [facility])    breast left   . Complication of anesthesia   . Environmental allergies   . Family history of adverse reaction to anesthesia    son-breathing problems-coded 1st time when he was 2 and had another surgery at 53 and had to be admitted  for breathing problems  . Family history of breast cancer   . GERD (gastroesophageal reflux disease)   . Headache    migraines  . Hypertension   . Mitral valve disease   . Mitral valve prolapse   . Painful menstrual periods   . PONV (postoperative nausea and vomiting)   . Vertigo     Past Surgical History:  Procedure Laterality Date  . ABDOMINAL HYSTERECTOMY  2010    supracervical   . AXILLARY LYMPH NODE DISSECTION Left 07/12/2018   Procedure: AXILLARY LYMPH NODE DISSECTION;  Surgeon: Robert Bellow, MD;  Location: [facility] ORS;  Service: General;  Laterality: Left;  . BREAST BIOPSY Left 11/2017   invasive ductal carcinoma and metastatic LN  . BREAST BIOPSY WITH SENTINEL LYMPH NODE BIOPSY AND NEEDLE LOCALIZATION Left 07/12/2018   Procedure: BREAST BIOPSY WIDE EXCISION WITH SENTINEL NODE  AND NEEDLE LOCALIZATION OF OXILLARY NODS LEFT;  Surgeon: Robert Bellow, MD;  Location: [facility] ORS;  Service: General;  Laterality: Left;  . BUNIONECTOMY Bilateral   . MANDIBLE RECONSTRUCTION     age 36-underbite  . PORTACATH PLACEMENT Right 12/31/2017   Procedure: INSERTION PORT-A-CATH;  Surgeon: Robert Bellow, MD;  Location: [facility] ORS;  Service: General;  Laterality: Right;  . RE-EXCISION OF BREAST LUMPECTOMY Left 07/24/2018   Procedure: RE-EXCISION OF BREAST LUMPECTOMY LEFT;  Surgeon: Robert Bellow, MD;  Location: [facility] ORS;  Service: General;  Laterality: Left;  . SHOULDER ARTHROSCOPY WITH OPEN ROTATOR CUFF REPAIR Right 04/26/2017   Procedure: SHOULDER ARTHROSCOPY WITH OPEN ROTATOR CUFF REPAIR,SUBACROMINAL DECOMPRESSION;  Surgeon: Thornton Park, MD;  Location: [facility] ORS;  Service: Orthopedics;  Laterality: Right;  . TONSILLECTOMY      Social History   Socioeconomic History  . Marital status: Divorced    Spouse name: Not on file  . Number of children: Not on file  . Years of education: Not on file  . Highest education level: Not on file  Occupational History  . Not on file  Social Needs  . Financial resource strain: Not on file  . Food insecurity    Worry: Not on file    Inability: Not on file  . Transportation needs    Medical: Not on file    Non-medical: Not on file  Tobacco Use  . Smoking status: Former Smoker    Packs/day: 0.50    Years: 10.00    Pack years: 5.00    Types: Cigarettes    Quit date: 04/20/2007    Years since quitting: 11.6   . Smokeless tobacco: Never Used  . Tobacco comment: social smoker back then  Substance and Sexual Activity  . Alcohol use: Yes    Comment: occas  . Drug use: No  . Sexual activity: Yes  Lifestyle  . Physical activity    Days per week: Not on file    Minutes per session: Not on file  . Stress: Not on file  Relationships  . Social Herbalist on phone: Not on file    Gets together: Not on file    Attends religious service: Not on file    Active member of club or organization: Not on file    Attends meetings of clubs or organizations: Not on file    Relationship status: Not on file  . Intimate partner violence    Fear of current or ex partner: Not on file    Emotionally abused: Not on file    Physically abused: Not on file  Forced sexual activity: Not on file  Other Topics Concern  . Not on file  Social History Narrative  . Not on file    Family History  Problem Relation Age of Onset  . Breast cancer Mother 76  . Diabetes Father   . Cancer Maternal Uncle        colon  . Breast cancer Maternal Grandmother 70   Allergies  Allergen Reactions  . Hydrocodone Hives    Swollen face - many years ago. Thinks she has tolerated since.  . Iodine Hives  . Peanut Butter Flavor Other (See Comments)    Made mouth tingle  . Shellfish Allergy Hives  . Vancomycin Other (See Comments)    Itching and forehead turned red    ? Current Facility-Administered Medications  Medication Dose Route Frequency Provider Last Rate Last Dose  . ALPRAZolam Duanne Moron) tablet 0.25 mg  0.25 mg Oral BID PRN Para Skeans, MD   0.25 mg at 12/23/18 1033  . cholecalciferol (VITAMIN D) tablet 2,000 Units  2,000 Units Oral QPM Para Skeans, MD   2,000 Units at 12/22/18 1752  . diphenhydrAMINE (BENADRYL) capsule 25 mg  25 mg Oral Q4H PRN Swayze, Ava, DO      . [START ON 12/24/2018] feeding supplement (ENSURE ENLIVE) (ENSURE ENLIVE) liquid 237 mL  237 mL Oral BID BM Swayze, Ava, DO      . fluticasone  (FLONASE) 50 MCG/ACT nasal spray 1 spray  1 spray Each Nare Daily Florina Ou V, MD      . HYDROmorphone (DILAUDID) injection 0.5 mg  0.5 mg Intravenous Q3H PRN Para Skeans, MD   0.5 mg at 12/23/18 1628  . ibuprofen (ADVIL) tablet 400 mg  400 mg Oral Q6H PRN Lang Snow, NP   400 mg at 12/23/18 0332  . losartan (COZAAR) tablet 50 mg  50 mg Oral Daily Para Skeans, MD   50 mg at 12/22/18 1752  . montelukast (SINGULAIR) tablet 10 mg  10 mg Oral QHS Florina Ou V, MD      . ondansetron [facility]) injection 4 mg  4 mg Intravenous Q6H PRN Lang Snow, NP   4 mg at 12/22/18 1601  . oxyCODONE (Oxy IR/ROXICODONE) immediate release tablet 5 mg  5 mg Oral Q3H PRN Swayze, Ava, DO   5 mg at 12/22/18 1601  . pantoprazole (PROTONIX) EC tablet 40 mg  40 mg Oral Daily Para Skeans, MD   40 mg at 12/23/18 0939  . sucralfate (CARAFATE) tablet 1 g  1 g Oral TID WC & HS Para Skeans, MD   1 g at 12/23/18 1627     Abtx:  Anti-infectives (From admission, onward)   None      REVIEW OF SYSTEMS:  Const:  fever,  chills, negative weight loss Eyes: negative diplopia or visual changes, negative eye pain ENT: negative coryza, negative sore throat Resp: negative cough, hemoptysis, dyspnea Cards: negative for chest pain, palpitations, lower extremity edema GU: negative for frequency, dysuria and hematuria GI: As above Skin: negative for rash and pruritus Heme: negative for easy bruising and gum/nose bleeding MS: Generalized weakness Neurolo:negative for headaches, dizziness, vertigo, memory problems  Psych: Has anxiety now because of the fever Endocrine: negative for thyroid, diabetes issues. Allergy/Immunology-as above ?: Objective:  VITALS:  BP 118/70   Pulse (!) 106   Temp 99.4 F (37.4 C) (Oral)   Resp 16   Ht 5' 6"  (1.676 m)   Wt 64.9  kg   SpO2 97%   BMI 23.08 kg/m  PHYSICAL EXAM:  General: Alert, cooperative, no distress, appears stated age.  Head: Normocephalic,  without obvious abnormality, atraumatic. Eyes: Conjunctivae clear, anicteric sclerae. Pupils are equal ENT Nares normal. No drainage or sinus tenderness. Lips, mucosa, and tongue normal. No Thrush Neck: Supple, symmetrical, no adenopathy, thyroid: non tender no carotid bruit and no JVD. Back: No CVA tenderness. Lungs: Clear to auscultation bilaterally. No Wheezing or Rhonchi. No rales. Right-sided chest wall port site clean with no erythema Heart: Tachycardia Abdomen: Tenderness over the epigastrium and right upper quadrant.  Enlarged liver. Extremities: atraumatic, no cyanosis. No edema. No clubbing Skin: No rashes or lesions. Or bruising Lymph: Cervical, supraclavicular normal. Neurologic: Grossly non-focal Pertinent Labs Lab Results CBC    Component Value Date/Time   WBC 5.4 12/23/2018 0414   RBC 3.12 (L) 12/23/2018 0414   HGB 10.2 (L) 12/23/2018 0414   HGB 11.8 08/13/2017 1556   HCT 29.8 (L) 12/23/2018 0414   HCT 35.0 08/13/2017 1556   PLT 45 (L) 12/23/2018 0414   PLT 198 08/13/2017 1556   MCV 95.5 12/23/2018 0414   MCV 90 08/13/2017 1556   MCV 92 11/25/2012 1542   MCH 32.7 12/23/2018 0414   MCHC 34.2 12/23/2018 0414   RDW 16.2 (H) 12/23/2018 0414   RDW 12.8 08/13/2017 1556   RDW 12.2 11/25/2012 1542   LYMPHSABS 0.2 (L) 12/19/2018 0557   LYMPHSABS 0.4 (L) 08/13/2017 1556   LYMPHSABS 1.7 11/25/2012 1542   MONOABS 0.1 12/19/2018 0557   MONOABS 0.4 11/25/2012 1542   EOSABS 0.0 12/19/2018 0557   EOSABS 0.2 08/13/2017 1556   EOSABS 0.1 11/25/2012 1542   BASOSABS 0.0 12/19/2018 0557   BASOSABS 0.0 08/13/2017 1556   BASOSABS 0.1 11/25/2012 1542    CMP Latest Ref Rng & Units 12/23/2018 12/22/2018 12/21/2018  Glucose 70 - 99 mg/dL 131(H) 114(H) 139(H)  BUN 6 - 20 mg/dL 10 11 12   Creatinine 0.44 - 1.00 mg/dL 0.49 0.45 0.72  Sodium 135 - 145 mmol/L 130(L) 133(L) 129(L)  Potassium 3.5 - 5.1 mmol/L 4.2 4.1 4.0  Chloride 98 - 111 mmol/L 95(L) 94(L) 91(L)  CO2 22 - 32  mmol/L 24 27 26   Calcium 8.9 - 10.3 mg/dL 8.2(L) 8.7(L) 8.6(L)  Total Protein 6.5 - 8.1 g/dL 5.6(L) - 6.9  Total Bilirubin 0.3 - 1.2 mg/dL 0.9 - 1.1  Alkaline Phos 38 - 126 U/L 98 - 116  AST 15 - 41 U/L 223(H) - 269(H)  ALT 0 - 44 U/L 138(H) - 158(H)      Microbiology: Recent Results (from the past 240 hour(s))  SARS CORONAVIRUS 2 (TAT 6-24 HRS) Nasopharyngeal Nasopharyngeal Swab     Status: None   Collection Time: 12/18/18  7:20 PM   Specimen: Nasopharyngeal Swab  Result Value Ref Range Status   SARS Coronavirus 2 NEGATIVE NEGATIVE Final    Comment: (NOTE) SARS-CoV-2 target nucleic acids are NOT DETECTED. The SARS-CoV-2 RNA is generally detectable in upper and lower respiratory specimens during the acute phase of infection. Negative results do not preclude SARS-CoV-2 infection, do not rule out co-infections with other pathogens, and should not be used as the sole basis for treatment or other patient management decisions. Negative results must be combined with clinical observations, patient history, and epidemiological information. The expected result is Negative. Fact Sheet for Patients: SugarRoll.be Fact Sheet for Healthcare Providers: https://www.woods-mathews.com/ This test is not yet approved or cleared by the [place] FDA and  has been authorized for detection and/or diagnosis of SARS-CoV-2 by FDA under an Emergency Use Authorization (EUA). This EUA will remain  in effect (meaning this test can be used) for the duration of the COVID-19 declaration under Section 56 4(b)(1) of the Act, 21 U.S.C. section 360bbb-3(b)(1), unless the authorization is terminated or revoked sooner. Performed at [facility], [place] [address] [place], [place] [address]   Blood culture (routine x 2)     Status: None   Collection Time: 12/18/18  7:57 PM   Specimen: BLOOD  Result Value Ref Range Status   Specimen Description BLOOD PORTA CATH   Final   Special Requests   Final    BOTTLES DRAWN AEROBIC AND ANAEROBIC Blood Culture adequate volume   Culture   Final    NO GROWTH 5 DAYS Performed at [facility], [address], [place], [place] [address]    Report Status 12/23/2018 FINAL  Final  Blood culture (routine x 2)     Status: None   Collection Time: 12/18/18  7:57 PM   Specimen: BLOOD  Result Value Ref Range Status   Specimen Description BLOOD PORTA CATH  Final   Special Requests   Final    BOTTLES DRAWN AEROBIC AND ANAEROBIC Blood Culture adequate volume   Culture   Final    NO GROWTH 5 DAYS Performed at [facility], [address], [place], [place] [address]    Report Status 12/23/2018 FINAL  Final  SARS CORONAVIRUS 2 (TAT 6-24 HRS) Nasopharyngeal Nasopharyngeal Swab     Status: None   Collection Time: 12/21/18  4:24 PM   Specimen: Nasopharyngeal Swab  Result Value Ref Range Status   SARS Coronavirus 2 NEGATIVE NEGATIVE Final    Comment: (NOTE) SARS-CoV-2 target nucleic acids are NOT DETECTED. The SARS-CoV-2 RNA is generally detectable in upper and lower respiratory specimens during the acute phase of infection. Negative results do not preclude SARS-CoV-2 infection, do not rule out co-infections with other pathogens, and should not be used as the sole basis for treatment or other patient management decisions. Negative results must be combined with clinical observations, patient history, and epidemiological information. The expected result is Negative. Fact Sheet for Patients: SugarRoll.be Fact Sheet for Healthcare Providers: https://www.woods-mathews.com/ This test is not yet approved or cleared by the [place] FDA and  has been authorized for detection and/or diagnosis of SARS-CoV-2 by FDA under an Emergency Use Authorization (EUA). This EUA will remain  in effect (meaning this test can be used) for the duration of the COVID-19 declaration  under Section 56 4(b)(1) of the Act, 21 U.S.C. section 360bbb-3(b)(1), unless the authorization is terminated or revoked sooner. Performed at [facility], [place] [address] [place], [place] [address]   CULTURE, BLOOD (ROUTINE X 2) w Reflex to ID Panel     Status: None (Preliminary result)   Collection Time: 12/21/18  4:24 PM   Specimen: BLOOD  Result Value Ref Range Status   Specimen Description BLOOD R WRIST  Final   Special Requests   Final    BOTTLES DRAWN AEROBIC AND ANAEROBIC Blood Culture adequate volume   Culture   Final    NO GROWTH 2 DAYS Performed at [facility], [address] [place], [place] [address]    Report Status PENDING  Incomplete  CULTURE, BLOOD (ROUTINE X 2) w Reflex to ID Panel     Status: None (Preliminary result)   Collection Time: 12/21/18  4:24 PM  Specimen: BLOOD  Result Value Ref Range Status   Specimen Description BLOOD RAC  Final   Special Requests   Final    BOTTLES DRAWN AEROBIC AND ANAEROBIC Blood Culture adequate volume   Culture   Final    NO GROWTH 2 DAYS Performed at [facility], [address], [place], [place] [address]    Report Status PENDING  Incomplete    IMAGING RESULTS:  I have personally reviewed the films ? Impression/Recommendation ? ? Fever Likely due to tumor burden and possible portal vein thrombus.  Because of severe tachycardia and very high temperature swings need to rule out infection.  The fever has started even before the liver biopsy but it was never more than 100 but currently she is running temperature and 103. blood culture 11/18, 11/21and 11/23 neg.  Will start Zosyn to cover gram-negative rods nosocomial and anaerobes.  Currently will not do vancomycin.  Vancomycin likely caused "red man" syndrome and pruritus.  This is not an allergic reaction.  Just need to run its slowly.  Stage IV breast cancer with liver mets. On capecitabine Thrombocytopenia- concern for bleeding into the  liver mass   Transaminitis: With normal bili and alk phosphatase  Anemia   ?Discussed management with patient in great detail ___________________________________________________ Discussed with patient, requesting provider Note:  This document was prepared using Dragon voice recognition software and may include unintentional dictation errors.

## 2018-12-24 DIAGNOSIS — C50919 Malignant neoplasm of unspecified site of unspecified female breast: Secondary | ICD-10-CM

## 2018-12-24 DIAGNOSIS — R509 Fever, unspecified: Secondary | ICD-10-CM | POA: Diagnosis not present

## 2018-12-24 DIAGNOSIS — Z515 Encounter for palliative care: Secondary | ICD-10-CM | POA: Diagnosis not present

## 2018-12-24 DIAGNOSIS — R7401 Elevation of levels of liver transaminase levels: Secondary | ICD-10-CM | POA: Diagnosis not present

## 2018-12-24 DIAGNOSIS — R1011 Right upper quadrant pain: Secondary | ICD-10-CM | POA: Diagnosis not present

## 2018-12-24 LAB — PROCALCITONIN: Procalcitonin: 1.59 ng/mL

## 2018-12-24 LAB — LACTIC ACID, PLASMA: Lactic Acid, Venous: 2 mmol/L (ref 0.5–1.9)

## 2018-12-24 MED ORDER — POLYETHYLENE GLYCOL 3350 17 G PO PACK
17.0000 g | PACK | Freq: Every day | ORAL | Status: DC
  Administered 2018-12-24 – 2018-12-25 (×2): 17 g via ORAL
  Filled 2018-12-24 (×3): qty 1

## 2018-12-24 MED ORDER — LINEZOLID 600 MG PO TABS
600.0000 mg | ORAL_TABLET | Freq: Two times a day (BID) | ORAL | Status: DC
  Administered 2018-12-24 – 2018-12-25 (×2): 600 mg via ORAL
  Filled 2018-12-24 (×3): qty 1

## 2018-12-24 MED ORDER — SODIUM CHLORIDE 0.9 % IV SOLN
INTRAVENOUS | Status: DC | PRN
  Administered 2018-12-24: 250 mL via INTRAVENOUS

## 2018-12-24 NOTE — Progress Notes (Signed)
   12/24/18 1400  Clinical Encounter Type  Visited With Patient  Visit Type Follow-up  Referral From Care management  Consult/Referral To Chaplain  Chaplain was referred this patient by management. However, patient refused visit at the time, she was not feeling well.

## 2018-12-24 NOTE — Progress Notes (Signed)
Date of Admission:  12/21/2018      ID: Shelby [name] is a 48 y.o. female  Principal Problem:   Abdominal pain, acute, right upper quadrant Active Problems:   Hypertension   Transaminitis   Liver mass   Palliative care encounter    Subjective: Patient continues to have fever But says she is feeling  better today Pain abdomen less No nausea or vomiting    Medications:  . cholecalciferol  2,000 Units Oral QPM  . feeding supplement (ENSURE ENLIVE)  237 mL Oral BID BM  . fluticasone  1 spray Each Nare Daily  . losartan  50 mg Oral Daily  . montelukast  10 mg Oral QHS  . pantoprazole  40 mg Oral Daily  . polyethylene glycol  17 g Oral Daily  . sucralfate  1 g Oral TID WC & HS    Objective: Vital signs in last 24 hours: Patient Vitals for the past 24 hrs:  BP Temp Temp src Pulse Resp SpO2  12/24/18 1635 - 98.7 F (37.1 C) Oral - - -  12/24/18 1509 - (!) 102.4 F (39.1 C) Oral - - -  12/24/18 1156 126/77 98.2 F (36.8 C) - (!) 115 16 90 %  12/24/18 0541 115/82 97.6 F (36.4 C) Oral 79 20 98 %  12/24/18 0059 105/71 98.1 F (36.7 C) Oral 92 20 95 %  12/23/18 2215 - (!) 103 F (39.4 C) Oral (!) 126 - 96 %   PHYSICAL EXAM:  General: Alert, cooperative, no distress, appears stated age.  Head: Normocephalic, without obvious abnormality, atraumatic. Eyes: Conjunctivae clear, anicteric sclerae. Pupils are equal ENT Nares normal. No drainage or sinus tenderness. Lips, mucosa, and tongue normal. No Thrush Neck: Supple, symmetrical, no adenopathy, thyroid: non tender no carotid bruit and no JVD. Back: No CVA tenderness. Lungs: Clear to auscultation bilaterally. No Wheezing or Rhonchi. No rales. Heart: tachycardia Abdomen:fullness epigastrium Extremities: atraumatic, no cyanosis. No edema. No clubbing Skin: No rashes or lesions. Or bruising Lymph: Cervical, supraclavicular normal. Neurologic: Grossly non-focal  Lab Results Recent Labs    12/23/18 0414  12/23/18 2102  WBC 5.4 3.9*  HGB 10.2* 10.3*  HCT 29.8* 28.5*  NA 130* 131*  K 4.2 3.6  CL 95* 94*  CO2 24 25  BUN 10 10  CREATININE 0.49 0.63   Liver Panel Recent Labs    12/23/18 0414 12/23/18 2102  PROT 5.6* 5.5*  ALBUMIN 2.8* 2.6*  AST 223* 199*  ALT 138* 124*  ALKPHOS 98 101  BILITOT 0.9 1.0   Sedimentation Rate No results for input(s): ESRSEDRATE in the last 72 hours. C-Reactive Protein No results for input(s): CRP in the last 72 hours.  Microbiology:  Studies/Results: Dg Chest 1 View  Result Date: 12/23/2018 CLINICAL DATA:  Cirrhosis, ascites, SIRS, history LEFT breast cancer, hypertension EXAM: CHEST  1 VIEW COMPARISON:  Portable exam 1016 hours compared to 12/21/2018 FINDINGS: RIGHT subclavian Port-A-Cath with tip projecting over superior RIGHT atrium, unchanged. Normal heart size, mediastinal contours, and pulmonary vascularity. Bibasilar atelectasis. Remaining lungs clear. No pleural effusion or pneumothorax. IMPRESSION: Bibasilar atelectasis. Electronically Signed   By: Lavonia Dana M.D.   On: 12/23/2018 10:58     Assessment/Plan:  Fever :concern for infection especially following a liver biopsy.  The DD is tumor burden versus hemorrhage in the tumor. Versus left portal vein thrombosis Added Zosyn yesterday to cover nosocomial gram negatives and anaerobes.As she continues to have fever we will need to add either  vancomycin or linezolid- she does not want vanco as she had pruritus with it May have get Doppler with special attention to the splenic portal confluence.  May have to repeat another MRI MRCP   Vancomycin causing "red man" syndrome and pruritus.  Is not an allergic reaction.  We will talk to her today  Stage IV breast cancer with liver mets On capecitabine  Transaminitis likely due to the liver mets.  Thrombocytopenia stable   Discussed the management with the patient

## 2018-12-24 NOTE — Progress Notes (Addendum)
PROGRESS NOTE  Shelby [name] WYO:[id number] DOB: 02/08/1970 DOA: 12/21/2018 PCP: Jerrol Banana., MD  Brief History   The patient is a 48 yr old woman who was discharged from this facility on 12/20/2018 after a stay for abdominal pain, liver mass, and cirrhosis of the liver with ascites, and SIRS. She underwent biopsy of her liver mass and was discharged.   On 12/21/2018 the patient returned to the ED with complaints of abdominal pain and subjective fevers as well as shortness of breath. She was found to have atelectasis and inflammatory changes in her right lower lobe. She has had some changes consistent with SIRS such as elevated lactic acid.   Biopsy results confirm metastasis of breast cancer. Dr. Grayland Ormond discussed this result with the patient. Fevers possibly due to occult infection or cancer. She has been started on Zosyn by infectious disease. Fever of 102.4 this afternoon. Consultants  . Palliative care . Oncology . Infectious disease.  Procedures  . None  Antibiotics   Anti-infectives (From admission, onward)   Start     Dose/Rate Route Frequency Ordered Stop   12/23/18 2100  piperacillin-tazobactam (ZOSYN) IVPB 3.375 g    Note to Pharmacy: Over 4 hours   3.375 g 12.5 mL/hr over 240 Minutes Intravenous Every 8 hours 12/23/18 2030       Subjective  The patient is resting comfortably. No new complaints.  Objective   Vitals:  Vitals:   12/24/18 1156 12/24/18 1509  BP: 126/77   Pulse: (!) 115   Resp: 16   Temp: 98.2 F (36.8 C) (!) 102.4 F (39.1 C)  SpO2: 90%    Exam:  Constitutional:  . The patient is awake, alert, and oriented x 3. No acute distress. Respiratory:  . No increased work of breathing. . No wheezes, rales, or rhonchi . No tactile fremitus . Positive for splinting Cardiovascular:  . Regular rate and rhythm . No murmurs, ectopy, or gallups. . No lateral PMI. No thrills. Abdomen:  . Abdomen is soft, non-tender, non-distended .  No hernias, masses, or organomegaly . Normoactive bowel sounds.  Musculoskeletal:  . No cyanosis, clubbing, or edema Skin:  . No rashes, lesions, ulcers . palpation of skin: no induration or nodules Neurologic:  . CN 2-12 intact . Sensation all 4 extremities intact Psychiatric:  . Mental status o Mood, affect appropriate o Orientation to person, place, time  . judgment and insight appear intact   I have personally reviewed the following:   Today's Data  . Vitals, CBC, BMP, lactic aicd, procalcitonin .  Micro Data  . Blood cultures x 2 have had no growth.  Imaging  . CTA chest: Right lower lobe atelectasis and inflammatory changes. . CT abdomen and pelvis: Extensive hypoattentuating lesions in liver, hepatomegaly, pancreas unremarkable. No abscess seen.  Scheduled Meds: . cholecalciferol  2,000 Units Oral QPM  . feeding supplement (ENSURE ENLIVE)  237 mL Oral BID BM  . fluticasone  1 spray Each Nare Daily  . losartan  50 mg Oral Daily  . montelukast  10 mg Oral QHS  . pantoprazole  40 mg Oral Daily  . polyethylene glycol  17 g Oral Daily  . sucralfate  1 g Oral TID WC & HS   Principal Problem:   Abdominal pain, acute, right upper quadrant Active Problems:   Hypertension   Transaminitis   Liver mass   Palliative care encounter   LOS: 3 days   A & P  Atelectasis: Present on CTA  chest. Due to splinting as a result of liver biopsy and hepatomegaly and capsule expansion. The patient has been ordered to use incentive spirometry, and pain control has been adjusted to allow the patient to be comfortable enough to take deep breaths. There is evidence of some inflammatory changes in the right lung as well. This is thought to be related to the atelectasis.   Fever, lactic acidosis, tachycardia, elevated procalcitonin: Inflammatory response to cancer vs infection. The patient has been started on zosyn by infectious disease. I appreciate their help.  Acute abdominal pain/right  upper quadrant pain: Attribute patient's right upper quadrant pain to her recent ultrasound-guided biopsy and effect of hepatomegaly on liver capsule. Suspect liver masses to be metastatic breast cancer. Pathology is positive for metastatic BRCA. Palliative care has been consulted.  BRCA with mets to liver: Palliative care consulted.   Hypertension: Blood pressure is a little bit high due to pain. We will continue patient on losartan 50 mg daily. Cardiac diet.  Pain control.  Transaminitis/liver mass: Attribute to metastatic breast cancer. Management per oncology, consider oncology consult.  As needed Dilaudid for pain.  I have seen and examined this patient myself. I have spent 34 minutes in her evaluation and care.  DVT prophylaxis: SCD's  Code Status: Full code  Family Communication: None available. Disposition Plan:  Home  Shelby Scarpati, DO [facility] Direct contact: see [email or web address]  7PM-7AM contact night coverage as above 12/24/2018, 4:07 PM  LOS: 1 day

## 2018-12-25 DIAGNOSIS — I10 Essential (primary) hypertension: Secondary | ICD-10-CM

## 2018-12-25 DIAGNOSIS — R109 Unspecified abdominal pain: Secondary | ICD-10-CM

## 2018-12-25 DIAGNOSIS — C787 Secondary malignant neoplasm of liver and intrahepatic bile duct: Secondary | ICD-10-CM | POA: Diagnosis not present

## 2018-12-25 DIAGNOSIS — Z978 Presence of other specified devices: Secondary | ICD-10-CM

## 2018-12-25 DIAGNOSIS — C50919 Malignant neoplasm of unspecified site of unspecified female breast: Secondary | ICD-10-CM | POA: Diagnosis not present

## 2018-12-25 DIAGNOSIS — R160 Hepatomegaly, not elsewhere classified: Secondary | ICD-10-CM

## 2018-12-25 DIAGNOSIS — R7401 Elevation of levels of liver transaminase levels: Secondary | ICD-10-CM | POA: Diagnosis not present

## 2018-12-25 DIAGNOSIS — R509 Fever, unspecified: Secondary | ICD-10-CM | POA: Diagnosis not present

## 2018-12-25 DIAGNOSIS — Z515 Encounter for palliative care: Secondary | ICD-10-CM | POA: Diagnosis not present

## 2018-12-25 DIAGNOSIS — R1011 Right upper quadrant pain: Secondary | ICD-10-CM | POA: Diagnosis not present

## 2018-12-25 LAB — BASIC METABOLIC PANEL
Anion gap: 11 (ref 5–15)
BUN: 9 mg/dL (ref 6–20)
CO2: 27 mmol/L (ref 22–32)
Calcium: 8.2 mg/dL — ABNORMAL LOW (ref 8.9–10.3)
Chloride: 96 mmol/L — ABNORMAL LOW (ref 98–111)
Creatinine, Ser: 0.46 mg/dL (ref 0.44–1.00)
GFR calc Af Amer: >60 mL/min (ref >60)
GFR calc non Af Amer: >60 mL/min (ref >60)
Glucose, Bld: 110 mg/dL — ABNORMAL HIGH (ref 70–99)
Potassium: 3.4 mmol/L — ABNORMAL LOW (ref 3.5–5.1)
Sodium: 134 mmol/L — ABNORMAL LOW (ref 135–145)

## 2018-12-25 LAB — CBC
HCT: 26.5 % — ABNORMAL LOW (ref 36.0–46.0)
Hemoglobin: 9 g/dL — ABNORMAL LOW (ref 12.0–15.0)
MCH: 31.9 pg (ref 26.0–34.0)
MCHC: 34 g/dL (ref 30.0–36.0)
MCV: 94 fL (ref 80.0–100.0)
Platelets: 49 10*3/uL — ABNORMAL LOW (ref 150–400)
RBC: 2.82 MIL/uL — ABNORMAL LOW (ref 3.87–5.11)
RDW: 15.8 % — ABNORMAL HIGH (ref 11.5–15.5)
WBC: 4.6 10*3/uL (ref 4.0–10.5)
nRBC: 0 % (ref 0.0–0.2)

## 2018-12-25 MED ORDER — DEXAMETHASONE 4 MG PO TABS
8.0000 mg | ORAL_TABLET | Freq: Three times a day (TID) | ORAL | Status: DC
  Administered 2018-12-25 – 2018-12-27 (×7): 8 mg via ORAL
  Filled 2018-12-25 (×9): qty 2

## 2018-12-25 NOTE — Progress Notes (Signed)
  [place]  [phone] [phone]) [phone]  ID: Carolan Clines OB: 07/01/1970  MR#: [id number]  [id number]    Lloyd Huger, MD   12/25/2018 2:38 PM     This encounter was created in error - please disregard.

## 2018-12-25 NOTE — Progress Notes (Addendum)
Patient ambulated in the hallway 320 feet with standby asst. Patient was on 3L and sating at 98%, heart rate increased to 133 but then decreased to within a normal range. Patient was a little short of breath but oxygen level remained steady at 98%. Orders were placed for patient to shower. Will continue to monitor.  Christene Slates

## 2018-12-25 NOTE — Progress Notes (Signed)
1.  Triple negative breast cancer: Metastasis confirmed by liver biopsy last week.  Patient has an appointment in the cancer center on January 01, 2019 to initiate palliative chemotherapy with [place]. 2.  Fevers: Appreciate ID input.  Possibly could be tumor fever, will order dexamethasone. 3.  Thrombocytopenia: Mildly improved, monitor. 4.  Elevated liver enzymes: Secondary to infiltrative malignancy, improving. 5.  Disposition: Okay to discharge from an oncology standpoint once patient is afebrile.  Follow-up as above.    Appreciate consult, will follow.

## 2018-12-25 NOTE — Progress Notes (Signed)
PROGRESS NOTE    Shelby [name]  FTD:[id number] DOB: [birth year] DOA: 12/21/2018 PCP: Jerrol Banana., MD   Brief Narrative:  The patient is a 48 yr old woman who was discharged from this facility on 12/20/2018 after a stay for abdominal pain, liver mass, and cirrhosis of the liver with ascites, and SIRS. She underwent biopsy of her liver mass and was discharged.   On 12/21/2018 the patient returned to the ED with complaints of abdominal pain and subjective fevers as well as shortness of breath. She was found to have atelectasis and inflammatory changes in her right lower lobe. She has had some changes consistent with SIRS such as elevated lactic acid.   Biopsy results confirm metastasis of breast cancer. Dr. Grayland Ormond discussed this result with the patient. Fevers possibly due to occult infection or cancer. She has been started on Zosyn by infectious disease   Assessment & Plan:   Principal Problem:   Abdominal pain, acute, right upper quadrant Active Problems:   Hypertension   Transaminitis   Liver mass   Palliative care encounter   Atelectasis: Present on CTA chest. Due to splinting as a result of liver biopsy and hepatomegaly and capsule expansion. The patient has been ordered to use incentive spirometry, and pain control has been adjusted to allow the patient to be comfortable enough to take deep breaths. There is evidence of some inflammatory changes in the right lung as well. This is thought to be related to the atelectasis.   Fever, lactic acidosis, tachycardia, elevated procalcitonin: Inflammatory response to cancer vs infection. The patient has been started on zosyn by infectious disease. I appreciate their help, follow-up on the recommendations.  Acute abdominal pain/right upper quadrant pain: Attribute patient's right upper quadrant pain to her recent ultrasound-guided biopsy and effect of hepatomegaly on liver capsule. Suspect liver masses to be metastatic  breast cancer. Pathology is positive for metastatic BRCA. Palliative care input appreciated  BRCA with mets to liver: Palliative care consulted patient was seen November 23  Hypertension: Blood pressure is a little bit high due to pain. We will continue patient on losartan 50 mg daily. Cardiac diet. Pain control.  Transaminitis/liver mass: Attribute to metastatic breast cancer. Management per oncology, consider oncology consult.  As needed Dilaudid for pain.  DVT prophylaxis: SCD/Compression stockings  Code Status: Full code    Code Status Orders  (From admission, onward)         Start     Ordered   12/21/18 1357  Full code  Continuous     12/21/18 1357        Code Status History    Date Active Date Inactive Code Status Order ID Comments User Context   12/18/2018 2314 12/20/2018 2118 Full Code [id number]  Rise Patience, MD ED   Advance Care Planning Activity     Family Communication: Discussed in detail with patient today Disposition Plan:   Patient remained inpatient for continued IV antibiotics, IV pain meds.  Patient not yet ready for discharge. Consults called: Infectious disease, oncology, palliative care Admission status: Inpatient   Consultants:   None  Procedures:  Ct Abdomen Pelvis Wo Contrast  Addendum Date: 12/18/2018   ADDENDUM REPORT: 12/18/2018 20:18 ADDENDUM: These results were called by telephone on 12/18/2018 at 8:18 pm to provider [facility] , who verbally acknowledged these results. Electronically Signed   By: Lovena Le M.D.   On: 12/18/2018 20:18   Result Date: 12/18/2018 CLINICAL DATA:  Abdominal pain and  distension, undergoing chemotherapy for breast cancer, history of IBS and diverticulitis EXAM: CT ABDOMEN AND PELVIS WITHOUT CONTRAST TECHNIQUE: Multidetector CT imaging of the abdomen and pelvis was performed following the standard protocol without IV contrast. COMPARISON:  CT abdomen pelvis 01/01/2018 FINDINGS: Lower chest: The patchy  areas of subpleural ground-glass opacity could reflect atelectasis or early infection. Cardiac size is normal. Trace pericardial effusion, similar to prior. Hepatobiliary: Ill-defined regions of hypoattenuation are seen the anterior left lobe liver. Slightly nodular hepatic surface contour which is new from comparison exam. These features are both incompletely assessed in the absence of contrast media. Gallbladder appears largely contracted at the time of exam. No biliary ductal dilatation. Pancreas: Unremarkable. No pancreatic ductal dilatation or surrounding inflammatory changes. Spleen: Normal in size without focal abnormality. Adrenals/Urinary Tract: No suspicious adrenal lesions. No visible or contour deforming renal lesions. No urolithiasis or hydronephrosis mild circumferential bladder wall thickening. Stomach/Bowel: Distal esophagus, stomach and duodenal sweep are unremarkable. No small bowel wall thickening or dilatation. No evidence of obstruction. Appendix is not clearly identified. No pericecal inflammation. There is some mild mural thickening of the splenic flexure and descending colon remaining portions of the colon are free of mural thickening or dilatation. Vascular/Lymphatic: Upper abdominal venous collateralization and splenorenal shunting is noted. No suspicious or enlarged lymph nodes in the included lymphatic chains. Reproductive: Uterus is surgically absent. No concerning adnexal lesions. Other: Small volume of low-attenuation ascites noted in the low abdomen. Small volume of perihepatic low-attenuation ascites is present as well. Musculoskeletal: Multilevel degenerative changes are present in the imaged portions of the spine. No acute osseous abnormality or suspicious osseous lesion. IMPRESSION: 1. Interval development of cirrhotic stigmata including a nodular liver surface contour and increasing upper abdominal venous collateralization. Small volume of ascites is noted as well. 2. Ill-defined  regions of hypoattenuation are seen within the anterior left lobe liver, which are incompletely assessed in the absence of contrast media. Furthermore findings are worrisome in the setting of known malignancy and potential intrinsic liver disease. Consider further evaluation with MRI or ultrasound if patient is unable to tolerate CT contrast due to allergy. 3. Mild mural thickening of the splenic flexure and descending colon, could reflect change related to the additional cirrhotic findings/portal colopathy though should exclude a an acute colitis clinically. 4. Patchy areas of subpleural ground-glass opacity in the visualized lung bases could reflect atelectasis or early infection. 5. Mild circumferential bladder wall thickening, which may be related to underdistention. Correlate with urinalysis to exclude cystitis. 6. Unchanged trace pericardial effusion. Electronically Signed: By: Lovena Le M.D. On: 12/18/2018 20:07   Dg Chest 1 View  Result Date: 12/23/2018 CLINICAL DATA:  Cirrhosis, ascites, SIRS, history LEFT breast cancer, hypertension EXAM: CHEST  1 VIEW COMPARISON:  Portable exam 1016 hours compared to 12/21/2018 FINDINGS: RIGHT subclavian Port-A-Cath with tip projecting over superior RIGHT atrium, unchanged. Normal heart size, mediastinal contours, and pulmonary vascularity. Bibasilar atelectasis. Remaining lungs clear. No pleural effusion or pneumothorax. IMPRESSION: Bibasilar atelectasis. Electronically Signed   By: Lavonia Dana M.D.   On: 12/23/2018 10:58   Ct Angio Chest Pe W And/or Wo Contrast  Result Date: 12/21/2018 CLINICAL DATA:  48 y.o. female discharged yesterday for concerns of abdominal pain with a work-up regarding her liver as well as she is meeting SIRS criteria with feverPatient comes back today reports she continues to have significant pain and discomfort. She has been having ongoing and severe pain located in the mid to right upper abdomen, also she  reports fairly significant  pain when she takes a deep breath over the right lower lungShe has been feeling warm had a fever when she was in the hospital yesterday. Some nausea. No headaches. Tested negative for Covid a few days ago pain is located primarily in the right upper abdomen under the right rib cage. EXAM: CT ANGIOGRAPHY CHEST CT ABDOMEN AND PELVIS WITH CONTRAST TECHNIQUE: Multidetector CT imaging of the chest was performed using the standard protocol during bolus administration of intravenous contrast. Multiplanar CT image reconstructions and MIPs were obtained to evaluate the vascular anatomy. Multidetector CT imaging of the abdomen and pelvis was performed using the standard protocol during bolus administration of intravenous contrast. CONTRAST:  61m OMNIPAQUE IOHEXOL 350 MG/ML SOLN COMPARISON:  Chest CTA and abdomen and pelvis CT dated 12/19/2018. FINDINGS: CTA CHEST FINDINGS Cardiovascular: There is satisfactory opacification of the pulmonary arteries to the segmental level. No evidence of a pulmonary embolism. Mediastinum/Nodes: No enlarged mediastinal, hilar, or axillary lymph nodes. Thyroid gland, trachea, and esophagus demonstrate no significant findings. Lungs/Pleura: Small bilateral pleural effusions. There is dependent opacity in the lower lobes consistent with atelectasis. Small areas of ground-glass opacity and small lung nodules noted. Dominant nodule in the right upper lobe, image 46, series 4, 6 mm. Several other small nodules are new from the prior CT. Area of ground-glass opacity adjacent to the oblique fissure in the left upper lobe is new. No pneumothorax. Musculoskeletal: No chest wall abnormality. No acute or significant osseous findings. Review of the MIP images confirms the above findings. CT ABDOMEN and PELVIS FINDINGS Hepatobiliary: Enlarged liver. There are multiple hypoattenuating areas throughout the liver. These areas are more apparent and more extensive than on the recent prior CT, particularly  evident at the dome of segment 4A, along the anterior aspect of the right lobe and medial segment of the left lobe and along the inferior aspect of the right lobe, segment 5 and 6. Gallbladder is mostly collapsed. There is increased attenuation material within the gallbladder similar to the prior CT. No bile duct dilation Pancreas: Unremarkable. No pancreatic ductal dilatation or surrounding inflammatory changes. Spleen: Normal in size without focal abnormality. Adrenals/Urinary Tract: No adrenal masses. Right kidney displaced inferiorly by the enlarged liver. Symmetric renal enhancement and excretion. No renal masses, stones or hydronephrosis. Normal ureters. Bladder wall is prominent, unchanged from the recent prior study. No bladder masses. Stomach/Bowel: Stomach is unremarkable, mostly decompressed. Small bowel and colon are normal in caliber. No wall thickening or inflammation. Appendix not discretely seen. No findings of appendicitis. Vascular/Lymphatic: As noted on the prior CT, left portal vein is attenuated, but does show enhancement. No evidence of thrombosis. There are venous collaterals in the upper abdomen. Aorta is unremarkable. No enlarged lymph nodes. Prominent Peri celiac node noted on the prior study is stable. Reproductive: Status post hysterectomy. No adnexal masses. Other: Small amount of ascites increased when compared to the prior CT. Musculoskeletal: No acute or significant osseous findings. Review of the MIP images confirms the above findings. IMPRESSION: CHEST CTA 1. No evidence of a pulmonary embolism. 2. Small pleural effusions, new from the prior CT. There is increased opacity in the lower lobes that is consistent with atelectasis. There several new small nodules and small areas of ground-glass opacity when compared to the prior CT. Suspect these are inflammatory given change from the recent exam. ABDOMEN AND PELVIS CT 1. Multiple hypoattenuating liver lesions. These appear to have  progressed when compared to the prior CT, although this change  could be due to differences in contrast timing. The liver is enlarged, stable from the recent exam. Findings are concerning for multifocal hepatocellular carcinoma. Follow-up liver MRI without and with contrast, when the patient can tolerate the procedure, is recommended. 2. Small amount ascites has increased in quantity from the prior study. 3. There are vascular collaterals in the upper abdomen, stable. Electronically Signed   By: Lajean Manes M.D.   On: 12/21/2018 18:36   Ct Angio Chest Pe W And/or Wo Contrast  Result Date: 12/19/2018 CLINICAL DATA:  Chest pain, complex, intermediate/high prob of ACS/PE/AAS; Abd distension Epigastric pain. Exertional dyspnea. Cough. Weakness. History of breast cancer. EXAM: CT ANGIOGRAPHY CHEST CT ABDOMEN AND PELVIS WITH CONTRAST TECHNIQUE: Multidetector CT imaging of the chest was performed using the standard protocol during bolus administration of intravenous contrast. Multiplanar CT image reconstructions and MIPs were obtained to evaluate the vascular anatomy. Multidetector CT imaging of the abdomen and pelvis was performed using the standard protocol during bolus administration of intravenous contrast. No reported contrast reaction. CONTRAST:  168m OMNIPAQUE IOHEXOL 350 MG/ML SOLN COMPARISON:  Abdominal CT without contrast yesterday. Chest CT 01/01/2018 FINDINGS: CTA CHEST FINDINGS Cardiovascular: There are no filling defects within the pulmonary arteries to suggest pulmonary embolus. Thoracic aorta is normal in caliber without dissection. Left vertebral artery arises directly from the thoracic aorta, variant arch anatomy. Heart is normal in size. No pericardial effusion. Right chest port in place with tip in the SVC. Mediastinum/Nodes: No mediastinal or hilar adenopathy. No visualized thyroid nodule. No esophageal wall thickening. Lungs/Pleura: Mild patchy areas of subpleural nodular and ground-glass  opacities. Basilar findings are unchanged from CT yesterday. 5 mm right upper lobe pulmonary nodule series 6, image 41. Sub solid nodularity slightly more inferiorly in the right lung series 6, image 45 measuring 8 mm. Tiny 2 mm nodule in the right upper lung series 6, image 32, may be fissural. Pulmonary nodules are new from prior chest CT. Perifissural in subpleural opacity in the right upper lobe, also series 6, image 32, nonspecific. No pulmonary edema. No pleural fluid. Trachea and mainstem bronchi are patent. Musculoskeletal: There are no acute or suspicious osseous abnormalities. Review of the MIP images confirms the above findings. CT ABDOMEN and PELVIS FINDINGS Hepatobiliary: Hepatic parenchyma diffusely heterogeneous with nodular contours. Liver is increased in size from December 2019 CT. Gallbladder is decompressed and not well assessed, intraluminal high density may be stones or sludge. Questionable wall thickening may be related to nondistention. Pancreas: No ductal dilatation or inflammation. Spleen: Normal in size without focal abnormality. Adrenals/Urinary Tract: Normal adrenal glands. No hydronephrosis or perinephric edema. Homogeneous renal enhancement with symmetric excretion on delayed phase imaging. Urinary bladder is partially distended, mild bladder wall thickening. Stomach/Bowel: Lack of enteric contrast and paucity of intra-abdominal fat limits detailed bowel assessment. The stomach is nondistended. Questionable pre-pyloric gastric wall thickening. No small bowel obstruction or inflammatory change. Appendix not discretely visualized, no pericecal inflammation to suggest appendicitis. Transverse colon is tortuous. Mild colonic wall thickening at the splenic flexure, colon is decompressed, this is not well assessed. Questionable areas of descending colonic wall thickening, for example series 11, image 61. Vascular/Lymphatic: Right portal vein is patent. The left portal vein is attenuated  without evidence of thrombus. Tortuous splenic vein with prominent collaterals in the left upper quadrant and left retroperitoneum. 9 mm perigastric lymph node series 11, image 29, nonspecific. No pelvic adenopathy. Reproductive: Uterus surgically absent. Left ovary tentatively visualized and normal. Right ovary not definitively seen.  No obvious adnexal mass. Other: Small volume of pelvic ascites measuring simple fluid density, similar to yesterday. Small perihepatic ascites anteriorly. No free air. Musculoskeletal: There are no acute or suspicious osseous abnormalities. Scattered bone islands in the pelvis. Review of the MIP images confirms the above findings. IMPRESSION: CT chest: 1. No pulmonary embolus. 2. Mild patchy areas of subpleural nodular and ground-glass opacities in both lungs. Slightly more confluent subpleural/perifissural right upper lobe opacity. Findings may be due to atelectasis or infection. 3. Small pulmonary nodules are new from December, infectious/inflammatory or metastatic. CT abdomen/pelvis: 1. Diffusely heterogeneous liver with nodular contours. Liver has increased in size since December 2019 CT. This may be due to underlying cirrhotic change versus diffuse metastatic disease. Recommend further evaluation with abdominal MRI. Left upper quadrant and retroperitoneal collaterals can be seen with portal hypertension. Left portal vein is attenuated, however no discrete thrombus. 2. Questionable pre-pyloric gastric wall thickening, peptic ulcer disease versus gastritis. 3. Bladder wall thickening, small volume abdominopelvic ascites, and areas of colonic wall thickening at the splenic flexure and descending colon are unchanged from CT yesterday. 4. Gallbladder is decompressed and not well assessed, intraluminal high density may be stones or sludge. Questionable gallbladder wall thickening may be related to nondistention or liver disease. This could be further evaluated with MRI or right upper  quadrant ultrasound. Electronically Signed   By: Keith Rake M.D.   On: 12/19/2018 05:33   Mr Abdomen W Wo Contrast  Result Date: 12/19/2018 CLINICAL DATA:  Abdominal pain in a patient with breast cancer. EXAM: MRI ABDOMEN WITHOUT AND WITH CONTRAST TECHNIQUE: Multiplanar multisequence MR imaging of the abdomen was performed both before and after the administration of intravenous contrast. CONTRAST:  51m GADAVIST GADOBUTROL 1 MMOL/ML IV SOLN COMPARISON:  CT of 12/19/2018 FINDINGS: Lower chest: Limited assessment of the lower chest on MRI is unremarkable. Hepatobiliary: Diffuse infiltration of much of the liver, nearly the entire left hepatic lobe, medial section and lateral section in addition to anterior right hepatic lobe with confluent infiltrative restricted diffusion and heterogeneous enhancement measuring approximately 21 by 7.4 cm. Diffuse metastatic disease is strongly suspected. Boundaries are well-demarcated without adjacent edema in the bordering hepatic parenchyma Discrete hepatic lesions are also scattered about the right hepatic lobe largest in the dome of the right hemi liver measuring approximately 2.5 cm in greatest dimension. These discrete lesions display a targetoid appearance. There are numerous lesions in the right hepatic lobe, lesions are found in all hepatic subsegment. Pancreas: No mass, inflammatory changes, or other parenchymal abnormality identified. Mild ductal distension in the tail of the pancreas may be due to adjacent mass effect. No visible pancreatic lesion is noted. Spleen:  Within normal limits in size and appearance. Adrenals/Urinary Tract: No masses identified. No evidence of hydronephrosis. Stomach/Bowel: Limited assessment of the gastrointestinal tract without signs of acute process. Vascular/Lymphatic: Left-sided peri-renal venous collaterals of uncertain significance perhaps related to elevated portal pressures in the setting of diffuse hepatic involvement. The  left portal vein is occluded. Right portal vein is patent. Scattered small lymph nodes in the upper abdomen. There is either slow flow or thrombus within the main portal vein at the level of the splenic portal confluence extending in the splenic vein. The SMV is patent. There is mass effect upon the vessel at the level of the splenic portal confluence due to hepatic enlargement. This is best seen on subtraction images, image 54, series 20. Note that slow flow in an otherwise opacified vessel can cause artifact  with a similar appearance however, this is seen on contrasted and non contrasted imaging with a similar appearance. Other:  None. Musculoskeletal: No signs of acute or destructive bone process. IMPRESSION: 1. Diffuse infiltration of much of the liver, nearly the entire left hepatic lobe, with confluent infiltrative restricted diffusion and heterogeneous enhancement measuring approximately 21 by 7.4 cm. Diffuse metastatic disease is strongly suspected with multifocal lesions in the right hepatic lobe. Superimposed infection is difficult to exclude though areas at the boundary of these lesions show no overt signs of edema. 2. Highly narrowed or occluded left portal vein with potential thrombus versus slow flow in the splenoportal confluence. This was patent on the exam of 12/19/2018; however, given above findings a venous phase CT or even ultrasound hepatic Doppler with special attention to the splenic portal confluence may be helpful to exclude this possibility, finding is also exhibited on TrueFISP sequence (image 24, series 10). 3. 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 4. Mild ductal distension in the tail of the pancreas, potentially due to mass effect. Attention on follow-up. 5. Critical Value/emergent results were called by telephone at the time of interpretation on 12/19/2018 at 6:43 pm to [place] , who verbally acknowledged these results. Electronically Signed   By: Zetta Bills M.D.   On: 12/19/2018 18:24   US Abdomen Complete  Result Date: 12/19/2018 CLINICAL DATA:  Abdominal pain EXAM: ABDOMEN ULTRASOUND COMPLETE COMPARISON:  MRI earlier today FINDINGS: Gallbladder: Gallbladder is contracted with associated mild wall thickening measuring up to 4 mm. Sludge noted within the gallbladder. No visible stones. Common bile duct: Diameter: Normal caliber, 3 mm Liver: Diffusely heterogeneous echotexture throughout the liver corresponding to the abnormality seen on today's MRI suspicious forinfiltrating mass. Main portal vein is patent on color Doppler imaging with normal direction of blood flow towards the liver. The area of possible thrombus seen on MRI at the level of the splenic portal confluence not visualized. IVC: No abnormality visualized. Pancreas: Visualized portion unremarkable. Spleen: Size and appearance within normal limits. Right Kidney: Length: 11.0 cm. Echogenicity within normal limits. No mass or hydronephrosis visualized. Left Kidney: Length: 10.5 cm. Echogenicity within normal limits. No mass or hydronephrosis visualized. Abdominal aorta: No aneurysm visualized. Other findings: None. IMPRESSION: Heterogeneous appearance throughout much of the left hepatic lobe as seen on today's MRI concerning for infiltrating mass/tumor. Main portal vein is patent. Contracted gallbladder with gallbladder wall thickening likely related to contracted state. Sludge within the gallbladder. No sonographic Murphy sign or visible stones. Electronically Signed   By: Rolm Baptise M.D.   On: 12/19/2018 20:35   Ct Abdomen Pelvis W Contrast  Result Date: 12/21/2018 CLINICAL DATA:  48 y.o. female discharged yesterday for concerns of abdominal pain with a work-up regarding her liver as well as she is meeting SIRS criteria with feverPatient comes back today reports she continues to have significant pain and  discomfort. She has been having ongoing and severe pain located in the mid to right upper abdomen, also she reports fairly significant pain when she takes a deep breath over the right lower lungShe has been feeling warm had a fever when she was in the hospital yesterday. Some nausea. No headaches. Tested negative for Covid a few days ago pain is located primarily in the right upper abdomen under the right rib cage. EXAM: CT ANGIOGRAPHY CHEST CT ABDOMEN AND PELVIS WITH CONTRAST TECHNIQUE: Multidetector CT imaging of the  chest was performed using the standard protocol during bolus administration of intravenous contrast. Multiplanar CT image reconstructions and MIPs were obtained to evaluate the vascular anatomy. Multidetector CT imaging of the abdomen and pelvis was performed using the standard protocol during bolus administration of intravenous contrast. CONTRAST:  56m OMNIPAQUE IOHEXOL 350 MG/ML SOLN COMPARISON:  Chest CTA and abdomen and pelvis CT dated 12/19/2018. FINDINGS: CTA CHEST FINDINGS Cardiovascular: There is satisfactory opacification of the pulmonary arteries to the segmental level. No evidence of a pulmonary embolism. Mediastinum/Nodes: No enlarged mediastinal, hilar, or axillary lymph nodes. Thyroid gland, trachea, and esophagus demonstrate no significant findings. Lungs/Pleura: Small bilateral pleural effusions. There is dependent opacity in the lower lobes consistent with atelectasis. Small areas of ground-glass opacity and small lung nodules noted. Dominant nodule in the right upper lobe, image 46, series 4, 6 mm. Several other small nodules are new from the prior CT. Area of ground-glass opacity adjacent to the oblique fissure in the left upper lobe is new. No pneumothorax. Musculoskeletal: No chest wall abnormality. No acute or significant osseous findings. Review of the MIP images confirms the above findings. CT ABDOMEN and PELVIS FINDINGS Hepatobiliary: Enlarged liver. There are multiple  hypoattenuating areas throughout the liver. These areas are more apparent and more extensive than on the recent prior CT, particularly evident at the dome of segment 4A, along the anterior aspect of the right lobe and medial segment of the left lobe and along the inferior aspect of the right lobe, segment 5 and 6. Gallbladder is mostly collapsed. There is increased attenuation material within the gallbladder similar to the prior CT. No bile duct dilation Pancreas: Unremarkable. No pancreatic ductal dilatation or surrounding inflammatory changes. Spleen: Normal in size without focal abnormality. Adrenals/Urinary Tract: No adrenal masses. Right kidney displaced inferiorly by the enlarged liver. Symmetric renal enhancement and excretion. No renal masses, stones or hydronephrosis. Normal ureters. Bladder wall is prominent, unchanged from the recent prior study. No bladder masses. Stomach/Bowel: Stomach is unremarkable, mostly decompressed. Small bowel and colon are normal in caliber. No wall thickening or inflammation. Appendix not discretely seen. No findings of appendicitis. Vascular/Lymphatic: As noted on the prior CT, left portal vein is attenuated, but does show enhancement. No evidence of thrombosis. There are venous collaterals in the upper abdomen. Aorta is unremarkable. No enlarged lymph nodes. Prominent Peri celiac node noted on the prior study is stable. Reproductive: Status post hysterectomy. No adnexal masses. Other: Small amount of ascites increased when compared to the prior CT. Musculoskeletal: No acute or significant osseous findings. Review of the MIP images confirms the above findings. IMPRESSION: CHEST CTA 1. No evidence of a pulmonary embolism. 2. Small pleural effusions, new from the prior CT. There is increased opacity in the lower lobes that is consistent with atelectasis. There several new small nodules and small areas of ground-glass opacity when compared to the prior CT. Suspect these are  inflammatory given change from the recent exam. ABDOMEN AND PELVIS CT 1. Multiple hypoattenuating liver lesions. These appear to have progressed when compared to the prior CT, although this change could be due to differences in contrast timing. The liver is enlarged, stable from the recent exam. Findings are concerning for multifocal hepatocellular carcinoma. Follow-up liver MRI without and with contrast, when the patient can tolerate the procedure, is recommended. 2. Small amount ascites has increased in quantity from the prior study. 3. There are vascular collaterals in the upper abdomen, stable. Electronically Signed   By: DDedra SkeensD.  On: 12/21/2018 18:36   Ct Abdomen Pelvis W Contrast  Result Date: 12/19/2018 CLINICAL DATA:  Chest pain, complex, intermediate/high prob of ACS/PE/AAS; Abd distension Epigastric pain. Exertional dyspnea. Cough. Weakness. History of breast cancer. EXAM: CT ANGIOGRAPHY CHEST CT ABDOMEN AND PELVIS WITH CONTRAST TECHNIQUE: Multidetector CT imaging of the chest was performed using the standard protocol during bolus administration of intravenous contrast. Multiplanar CT image reconstructions and MIPs were obtained to evaluate the vascular anatomy. Multidetector CT imaging of the abdomen and pelvis was performed using the standard protocol during bolus administration of intravenous contrast. No reported contrast reaction. CONTRAST:  176m OMNIPAQUE IOHEXOL 350 MG/ML SOLN COMPARISON:  Abdominal CT without contrast yesterday. Chest CT 01/01/2018 FINDINGS: CTA CHEST FINDINGS Cardiovascular: There are no filling defects within the pulmonary arteries to suggest pulmonary embolus. Thoracic aorta is normal in caliber without dissection. Left vertebral artery arises directly from the thoracic aorta, variant arch anatomy. Heart is normal in size. No pericardial effusion. Right chest port in place with tip in the SVC. Mediastinum/Nodes: No mediastinal or hilar adenopathy. No visualized  thyroid nodule. No esophageal wall thickening. Lungs/Pleura: Mild patchy areas of subpleural nodular and ground-glass opacities. Basilar findings are unchanged from CT yesterday. 5 mm right upper lobe pulmonary nodule series 6, image 41. Sub solid nodularity slightly more inferiorly in the right lung series 6, image 45 measuring 8 mm. Tiny 2 mm nodule in the right upper lung series 6, image 32, may be fissural. Pulmonary nodules are new from prior chest CT. Perifissural in subpleural opacity in the right upper lobe, also series 6, image 32, nonspecific. No pulmonary edema. No pleural fluid. Trachea and mainstem bronchi are patent. Musculoskeletal: There are no acute or suspicious osseous abnormalities. Review of the MIP images confirms the above findings. CT ABDOMEN and PELVIS FINDINGS Hepatobiliary: Hepatic parenchyma diffusely heterogeneous with nodular contours. Liver is increased in size from December 2019 CT. Gallbladder is decompressed and not well assessed, intraluminal high density may be stones or sludge. Questionable wall thickening may be related to nondistention. Pancreas: No ductal dilatation or inflammation. Spleen: Normal in size without focal abnormality. Adrenals/Urinary Tract: Normal adrenal glands. No hydronephrosis or perinephric edema. Homogeneous renal enhancement with symmetric excretion on delayed phase imaging. Urinary bladder is partially distended, mild bladder wall thickening. Stomach/Bowel: Lack of enteric contrast and paucity of intra-abdominal fat limits detailed bowel assessment. The stomach is nondistended. Questionable pre-pyloric gastric wall thickening. No small bowel obstruction or inflammatory change. Appendix not discretely visualized, no pericecal inflammation to suggest appendicitis. Transverse colon is tortuous. Mild colonic wall thickening at the splenic flexure, colon is decompressed, this is not well assessed. Questionable areas of descending colonic wall thickening, for  example series 11, image 61. Vascular/Lymphatic: Right portal vein is patent. The left portal vein is attenuated without evidence of thrombus. Tortuous splenic vein with prominent collaterals in the left upper quadrant and left retroperitoneum. 9 mm perigastric lymph node series 11, image 29, nonspecific. No pelvic adenopathy. Reproductive: Uterus surgically absent. Left ovary tentatively visualized and normal. Right ovary not definitively seen. No obvious adnexal mass. Other: Small volume of pelvic ascites measuring simple fluid density, similar to yesterday. Small perihepatic ascites anteriorly. No free air. Musculoskeletal: There are no acute or suspicious osseous abnormalities. Scattered bone islands in the pelvis. Review of the MIP images confirms the above findings. IMPRESSION: CT chest: 1. No pulmonary embolus. 2. Mild patchy areas of subpleural nodular and ground-glass opacities in both lungs. Slightly more confluent subpleural/perifissural right upper lobe opacity.  Findings may be due to atelectasis or infection. 3. Small pulmonary nodules are new from December, infectious/inflammatory or metastatic. CT abdomen/pelvis: 1. Diffusely heterogeneous liver with nodular contours. Liver has increased in size since December 2019 CT. This may be due to underlying cirrhotic change versus diffuse metastatic disease. Recommend further evaluation with abdominal MRI. Left upper quadrant and retroperitoneal collaterals can be seen with portal hypertension. Left portal vein is attenuated, however no discrete thrombus. 2. Questionable pre-pyloric gastric wall thickening, peptic ulcer disease versus gastritis. 3. Bladder wall thickening, small volume abdominopelvic ascites, and areas of colonic wall thickening at the splenic flexure and descending colon are unchanged from CT yesterday. 4. Gallbladder is decompressed and not well assessed, intraluminal high density may be stones or sludge. Questionable gallbladder wall  thickening may be related to nondistention or liver disease. This could be further evaluated with MRI or right upper quadrant ultrasound. Electronically Signed   By: Keith Rake M.D.   On: 12/19/2018 05:33   US Biopsy (liver)  Result Date: 12/20/2018 INDICATION: History of left breast carcinoma with imaging evidence of probable diffuse metastatic disease in the liver, more prominently in the left lobe. The patient presents for biopsy. EXAM: ULTRASOUND GUIDED CORE BIOPSY OF LIVER MEDICATIONS: None. ANESTHESIA/SEDATION: Fentanyl 100 mcg IV; Versed 2.0 mg IV Moderate Sedation Time:  20 minutes. The patient was continuously monitored during the procedure by the interventional radiology nurse under my direct supervision. PROCEDURE: The procedure, risks, benefits, and alternatives were explained to the patient. Questions regarding the procedure were encouraged and answered. The patient understands and consents to the procedure. A time-out was performed prior to initiating the procedure. Ultrasound was used to localize lesions within the liver. The anterior abdominal wall was prepped with chlorhexidine in a sterile fashion, and a sterile drape was applied covering the operative field. A sterile gown and sterile gloves were used for the procedure. Local anesthesia was provided with 1% Lidocaine. Under direct ultrasound guidance, a 17 gauge trocar needle was advanced into the left lobe of the liver. After confirming needle tip position, coaxial 18 gauge core biopsy samples were obtained. A total of 3 samples were obtained and submitted in formalin. A slurry of Gel-Foam pledgets was then slowly injected via the outer needle as the needle was retracted. Additional ultrasound was performed. COMPLICATIONS: None immediate. FINDINGS: Ultrasound demonstrates heterogeneous appearance of the liver with ill-defined lesions throughout the left lobe. Solid core biopsy samples were obtained by sampling lesions within the  lateral segment of the left lobe. IMPRESSION: Ultrasound-guided core biopsy performed of hepatic lesions within the left lobe. Electronically Signed   By: Aletta Edouard M.D.   On: 12/20/2018 13:44   Dg Chest Portable 1 View  Result Date: 12/21/2018 CLINICAL DATA:  Pleuritic right chest pain EXAM: PORTABLE CHEST 1 VIEW COMPARISON:  12/18/2018 chest radiograph. FINDINGS: Stable right subclavian Port-A-Cath terminating over the right atrium. Stable cardiomediastinal silhouette with normal heart size. No pneumothorax. No pleural effusion. No pulmonary edema. Curvilinear left lung base opacities. No acute consolidative airspace disease. IMPRESSION: Curvilinear left lung base opacities, favor scarring or atelectasis. No acute consolidative airspace disease. Electronically Signed   By: Ilona Sorrel M.D.   On: 12/21/2018 13:06   Dg Chest Portable 1 View  Result Date: 12/18/2018 CLINICAL DATA:  Fever abdominal pain EXAM: PORTABLE CHEST 1 VIEW COMPARISON:  12/31/2017 FINDINGS: Right-sided central venous port with tip over the cavoatrial region. No focal airspace disease or effusion. Normal heart size. No pneumothorax. IMPRESSION: No active  disease. Electronically Signed   By: Donavan Foil M.D.   On: 12/18/2018 19:49     Antimicrobials:   Zyvox 600 p.o. twice daily   Subjective: No acute events overnight still reports feeling weak, short of breath  Objective: Vitals:   12/25/18 0830 12/25/18 0919 12/25/18 1113 12/25/18 1330  BP:    109/78  Pulse:  (!) 125  (!) 130  Resp:    18  Temp:   100.1 F (37.8 C) 98.6 F (37 C)  TempSrc:   Oral Oral  SpO2: 96% (!) 85%  95%  Weight:      Height:        Intake/Output Summary (Last 24 hours) at 12/25/2018 1450 Last data filed at 12/25/2018 0513 Gross per 24 hour  Intake 80.16 ml  Output 700 ml  Net -619.84 ml   Filed Weights   12/21/18 0958  Weight: 64.9 kg    Examination:  General exam: Appears calm and comfortable  Respiratory  system: Decreased respiratory effort secondary to mass-effect from liver tumor, Cardiovascular system: S1 & S2 heard, RRR. No JVD, murmurs, rubs, gallops or clicks. No pedal edema. Gastrointestinal system: Abdomen is nondistended, soft and nontender. No organomegaly or masses felt. Normal bowel sounds heard. Central nervous system: Alert and oriented. No focal neurological deficits. Extremities: Well perfused moves all extremities freely Skin: No rashes, lesions or ulcers Psychiatry: Judgement and insight appear normal. Mood & affect appropriate.     Data Reviewed: I have personally reviewed following labs and imaging studies  CBC: Recent Labs  Lab 12/19/18 0557  12/21/18 1009 12/22/18 0555 12/23/18 0414 12/23/18 2102 12/25/18 0400  WBC 2.5*   < > 6.9 4.6 5.4 3.9* 4.6  NEUTROABS 2.2  --   --   --   --  3.0  --   HGB 11.8*   < > 11.8* 11.3* 10.2* 10.3* 9.0*  HCT 33.6*   < > 33.2* 33.3* 29.8* 28.5* 26.5*  MCV 94.4   < > 92.0 96.0 95.5 91.3 94.0  PLT 47*   < > 62* 39* 45* 38* 49*   < > = values in this interval not displayed.   Basic Metabolic Panel: Recent Labs  Lab 12/21/18 1009 12/22/18 0822 12/23/18 0414 12/23/18 2102 12/25/18 0400  NA 129* 133* 130* 131* 134*  K 4.0 4.1 4.2 3.6 3.4*  CL 91* 94* 95* 94* 96*  CO2 26 27 24 25 27   GLUCOSE 139* 114* 131* 165* 110*  BUN 12 11 10 10 9   CREATININE 0.72 0.45 0.49 0.63 0.46  CALCIUM 8.6* 8.7* 8.2* 8.2* 8.2*   GFR: Estimated Creatinine Clearance: 78.8 mL/min (by C-G formula based on SCr of 0.46 mg/dL). Liver Function Tests: Recent Labs  Lab 12/19/18 0557 12/21/18 1009 12/23/18 0414 12/23/18 2102  AST 173* 269* 223* 199*  ALT 115* 158* 138* 124*  ALKPHOS 104 116 98 101  BILITOT 1.2 1.1 0.9 1.0  PROT 6.8 6.9 5.6* 5.5*  ALBUMIN 3.4* 3.5 2.8* 2.6*   Recent Labs  Lab 12/21/18 1009  LIPASE 27   No results for input(s): AMMONIA in the last 168 hours. Coagulation Profile: Recent Labs  Lab 12/19/18 1355  12/20/18 0457  INR 1.3* 1.2   Cardiac Enzymes: No results for input(s): CKTOTAL, CKMB, CKMBINDEX, TROPONINI in the last 168 hours. BNP (last 3 results) No results for input(s): PROBNP in the last 8760 hours. HbA1C: No results for input(s): HGBA1C in the last 72 hours. CBG: No results for input(s): GLUCAP  in the last 168 hours. Lipid Profile: No results for input(s): CHOL, HDL, LDLCALC, TRIG, CHOLHDL, LDLDIRECT in the last 72 hours. Thyroid Function Tests: No results for input(s): TSH, T4TOTAL, FREET4, T3FREE, THYROIDAB in the last 72 hours. Anemia Panel: No results for input(s): VITAMINB12, FOLATE, FERRITIN, TIBC, IRON, RETICCTPCT in the last 72 hours. Sepsis Labs: Recent Labs  Lab 12/22/18 1756 12/23/18 0414 12/23/18 1004 12/23/18 1229 12/23/18 2102 12/24/18 0057  PROCALCITON 0.75 0.90  --   --   --  1.59  LATICACIDVEN  --   --  2.4* 2.9* 3.2* 2.0*    Recent Results (from the past 240 hour(s))  SARS CORONAVIRUS 2 (TAT 6-24 HRS) Nasopharyngeal Nasopharyngeal Swab     Status: None   Collection Time: 12/18/18  7:20 PM   Specimen: Nasopharyngeal Swab  Result Value Ref Range Status   SARS Coronavirus 2 NEGATIVE NEGATIVE Final    Comment: (NOTE) SARS-CoV-2 target nucleic acids are NOT DETECTED. The SARS-CoV-2 RNA is generally detectable in upper and lower respiratory specimens during the acute phase of infection. Negative results do not preclude SARS-CoV-2 infection, do not rule out co-infections with other pathogens, and should not be used as the sole basis for treatment or other patient management decisions. Negative results must be combined with clinical observations, patient history, and epidemiological information. The expected result is Negative. Fact Sheet for Patients: SugarRoll.be Fact Sheet for Healthcare Providers: https://www.woods-mathews.com/ This test is not yet approved or cleared by the [place] FDA and  has  been authorized for detection and/or diagnosis of SARS-CoV-2 by FDA under an Emergency Use Authorization (EUA). This EUA will remain  in effect (meaning this test can be used) for the duration of the COVID-19 declaration under Section 56 4(b)(1) of the Act, 21 U.S.C. section 360bbb-3(b)(1), unless the authorization is terminated or revoked sooner. Performed at [facility], [place] [address], [place], [place] [address]   Blood culture (routine x 2)     Status: None   Collection Time: 12/18/18  7:57 PM   Specimen: BLOOD  Result Value Ref Range Status   Specimen Description BLOOD PORTA CATH  Final   Special Requests   Final    BOTTLES DRAWN AEROBIC AND ANAEROBIC Blood Culture adequate volume   Culture   Final    NO GROWTH 5 DAYS Performed at [facility], [address] [place], [place] [address]    Report Status 12/23/2018 FINAL  Final  Blood culture (routine x 2)     Status: None   Collection Time: 12/18/18  7:57 PM   Specimen: BLOOD  Result Value Ref Range Status   Specimen Description BLOOD PORTA CATH  Final   Special Requests   Final    BOTTLES DRAWN AEROBIC AND ANAEROBIC Blood Culture adequate volume   Culture   Final    NO GROWTH 5 DAYS Performed at [facility], [address] [place], [place] [address]    Report Status 12/23/2018 FINAL  Final  SARS CORONAVIRUS 2 (TAT 6-24 HRS) Nasopharyngeal Nasopharyngeal Swab     Status: None   Collection Time: 12/21/18  4:24 PM   Specimen: Nasopharyngeal Swab  Result Value Ref Range Status   SARS Coronavirus 2 NEGATIVE NEGATIVE Final    Comment: (NOTE) SARS-CoV-2 target nucleic acids are NOT DETECTED. The SARS-CoV-2 RNA is generally detectable in upper and lower respiratory specimens during the acute phase of infection. Negative results do not preclude SARS-CoV-2 infection, do not rule out co-infections with other  pathogens, and should not be used as the sole basis for treatment or other patient  management decisions. Negative results must be combined with clinical observations, patient history, and epidemiological information. The expected result is Negative. Fact Sheet for Patients: SugarRoll.be Fact Sheet for Healthcare Providers: https://www.woods-mathews.com/ This test is not yet approved or cleared by the [place] FDA and  has been authorized for detection and/or diagnosis of SARS-CoV-2 by FDA under an Emergency Use Authorization (EUA). This EUA will remain  in effect (meaning this test can be used) for the duration of the COVID-19 declaration under Section 56 4(b)(1) of the Act, 21 U.S.C. section 360bbb-3(b)(1), unless the authorization is terminated or revoked sooner. Performed at [facility], [place] [address] [place], [place] [address]   CULTURE, BLOOD (ROUTINE X 2) w Reflex to ID Panel     Status: None (Preliminary result)   Collection Time: 12/21/18  4:24 PM   Specimen: BLOOD  Result Value Ref Range Status   Specimen Description BLOOD R WRIST  Final   Special Requests   Final    BOTTLES DRAWN AEROBIC AND ANAEROBIC Blood Culture adequate volume   Culture   Final    NO GROWTH 4 DAYS Performed at [facility], [address] [place], [place] [address]    Report Status PENDING  Incomplete  CULTURE, BLOOD (ROUTINE X 2) w Reflex to ID Panel     Status: None (Preliminary result)   Collection Time: 12/21/18  4:24 PM   Specimen: BLOOD  Result Value Ref Range Status   Specimen Description BLOOD RAC  Final   Special Requests   Final    BOTTLES DRAWN AEROBIC AND ANAEROBIC Blood Culture adequate volume   Culture   Final    NO GROWTH 4 DAYS Performed at [facility], [address], [place], [place] [address]    Report Status PENDING  Incomplete  CULTURE, BLOOD (ROUTINE X 2) w Reflex to ID Panel     Status: None (Preliminary result)   Collection Time: 12/23/18 10:04 AM   Specimen: BLOOD  Result  Value Ref Range Status   Specimen Description BLOOD BLOOD RIGHT HAND  Final   Special Requests   Final    BOTTLES DRAWN AEROBIC AND ANAEROBIC Blood Culture adequate volume   Culture   Final    NO GROWTH 2 DAYS Performed at [facility], [address] [place], [place] [address]    Report Status PENDING  Incomplete  CULTURE, BLOOD (ROUTINE X 2) w Reflex to ID Panel     Status: None (Preliminary result)   Collection Time: 12/23/18 10:11 AM   Specimen: BLOOD  Result Value Ref Range Status   Specimen Description BLOOD BLOOD RIGHT HAND  Final   Special Requests   Final    BOTTLES DRAWN AEROBIC AND ANAEROBIC Blood Culture results may not be optimal due to an excessive volume of blood received in culture bottles   Culture   Final    NO GROWTH 2 DAYS Performed at [facility], [address] [place], [place] [address]    Report Status PENDING  Incomplete         Radiology Studies: No results found.      Scheduled Meds:  cholecalciferol  2,000 Units Oral QPM   feeding supplement (ENSURE ENLIVE)  237 mL Oral BID BM   fluticasone  1 spray Each Nare Daily   linezolid  600 mg Oral Q12H   losartan  50 mg  Oral Daily   montelukast  10 mg Oral QHS   pantoprazole  40 mg Oral Daily   polyethylene glycol  17 g Oral Daily   sucralfate  1 g Oral TID WC & HS   Continuous Infusions:  sodium chloride Stopped (12/25/18 0153)   piperacillin-tazobactam 3.375 g (12/25/18 0858)     LOS: 4 days    Time spent: 24 min    Nicolette Bang, MD [facility]  If 7PM-7AM, please contact night-coverage  12/25/2018, 2:50 PM

## 2018-12-25 NOTE — Progress Notes (Signed)
ID  Patient had fever again this afternoon. She took ibuprofen as soon as she felt the temperature because she did not want to have chills and worsening temperature Still complains of abdominal pain No diarrhea On MiraLAX     Patient Vitals for the past 24 hrs:  BP Temp Temp src Pulse Resp SpO2  12/25/18 1330 109/78 98.6 F (37 C) Oral (!) 130 18 95 %  12/25/18 1113 - 100.1 F (37.8 C) Oral - - -  12/25/18 0919 - - - (!) 125 - (!) 85 %  12/25/18 0830 - - - - - 96 %  12/25/18 0537 106/73 98.8 F (37.1 C) Oral 89 18 94 %  12/25/18 0136 - 98.5 F (36.9 C) Oral - - -  12/24/18 2036 132/76 98.7 F (37.1 C) Oral (!) 101 18 97 %  12/24/18 1635 - 98.7 F (37.1 C) Oral - - -  12/24/18 1509 - (!) 102.4 F (39.1 C) Oral - - -    Objective On examination comfortable and in no distress Chest bilateral air entry.  Decreased in the bases Heart sounds S1-S2 Port right side of the chest wall Abdomen soft there is fullness in the epigastrium in the right upper quadrant area nodularity in the liver felt CNS nonfocal  Labs CBC Latest Ref Rng & Units 12/25/2018 12/23/2018 12/23/2018  WBC 4.0 - 10.5 K/uL 4.6 3.9(L) 5.4  Hemoglobin 12.0 - 15.0 g/dL 9.0(L) 10.3(L) 10.2(L)  Hematocrit 36.0 - 46.0 % 26.5(L) 28.5(L) 29.8(L)  Platelets 150 - 400 K/uL 49(L) 38(L) 45(L)    CMP Latest Ref Rng & Units 12/25/2018 12/23/2018 12/23/2018  Glucose 70 - 99 mg/dL 110(H) 165(H) 131(H)  BUN 6 - 20 mg/dL 9 10 10   Creatinine 0.44 - 1.00 mg/dL 0.46 0.63 0.49  Sodium 135 - 145 mmol/L 134(L) 131(L) 130(L)  Potassium 3.5 - 5.1 mmol/L 3.4(L) 3.6 4.2  Chloride 98 - 111 mmol/L 96(L) 94(L) 95(L)  CO2 22 - 32 mmol/L 27 25 24   Calcium 8.9 - 10.3 mg/dL 8.2(L) 8.2(L) 8.2(L)  Total Protein 6.5 - 8.1 g/dL - 5.5(L) 5.6(L)  Total Bilirubin 0.3 - 1.2 mg/dL - 1.0 0.9  Alkaline Phos 38 - 126 U/L - 101 98  AST 15 - 41 U/L - 199(H) 223(H)  ALT 0 - 44 U/L - 124(H) 138(H)   Microbiology blood culture 11/18 11/21 11/23   no growth UA on 1121 normal Chest x-ray 11/23 bibasilar atelectasis  Assessment and plan  Fever: Initially was concerning because she had a liver biopsy.  At the fever was started even before the biopsy was done.  She has not responded to antibiotics I will very likely this is tumor fever especially with significant tumor burden in the liver.  Also need to rule out hemorrhage to the tumor.  She likely has left portal vein thrombosis which could also be contributing to the fever.  She has been on Zosyn since 1123 and linezolid was started yesterday.  We will stop antibiotics today as no response.  Spoke with Dr. Grayland Ormond regarding corticosteroid use.  He has started her on Decadron.  Stage IV metastatic breast cancer with liver mets  Transaminitis likely due to extensive liver mets  Thrombocytopenia stable but could get worse with antibiotics like Zosyn and linezolid hence will discontinue both as there is no response no all cultures have been negative.  Discussed the management with the patient and the care team.

## 2018-12-26 DIAGNOSIS — R1011 Right upper quadrant pain: Secondary | ICD-10-CM | POA: Diagnosis not present

## 2018-12-26 DIAGNOSIS — Z515 Encounter for palliative care: Secondary | ICD-10-CM | POA: Diagnosis not present

## 2018-12-26 DIAGNOSIS — I10 Essential (primary) hypertension: Secondary | ICD-10-CM | POA: Diagnosis not present

## 2018-12-26 DIAGNOSIS — R7401 Elevation of levels of liver transaminase levels: Secondary | ICD-10-CM | POA: Diagnosis not present

## 2018-12-26 LAB — C-REACTIVE PROTEIN: CRP: 19.4 mg/dL — ABNORMAL HIGH (ref <1.0)

## 2018-12-26 LAB — CULTURE, BLOOD (ROUTINE X 2)
Culture: NO GROWTH
Culture: NO GROWTH
Special Requests: ADEQUATE
Special Requests: ADEQUATE

## 2018-12-26 NOTE — Progress Notes (Addendum)
PROGRESS NOTE    Shelby [name]  PPJ:[id number] DOB: 18-Apr-1970 DOA: 12/21/2018 PCP: Jerrol Banana., MD   Brief Narrative:  The patient is a 48 yr old woman who was discharged from this facility on 12/20/2018 after a stay for abdominal pain, liver mass, and cirrhosis of the liver with ascites, and SIRS. She underwent biopsy of her liver mass and was discharged.   On 12/21/2018 the patient returned to the ED with complaints of abdominal pain and subjective fevers as well as shortness of breath. She was found to have atelectasis and inflammatory changes in her right lower lobe. She has had some changes consistent with SIRS such as elevated lactic acid.   Biopsy results confirm metastasis of breast cancer. Dr. Grayland Ormond discussed this result with the patient.Fevers possibly due to occult infection or cancer. She has been started on Zosyn by infectious disease   Assessment & Plan:   Principal Problem:   Abdominal pain, acute, right upper quadrant Active Problems:   Hypertension   Transaminitis   Liver mass   Palliative care encounter   Hypoxic respiratory failure with atelectasis: As previously noted, present on CTA chest. Due to splinting as a result of liver biopsy and hepatomegaly and capsule expansion. The patient has been ordered to use incentive spirometry, and pain control has been adjusted to allow the patient to be comfortable enough to take deep breaths. There is evidence of some inflammatory changes in the right lung as well. This is thought to be related to the atelectasis.   Fever, lactic acidosis, tachycardia, elevated procalcitonin:Inflammatory response to cancervs infection. The patient was started on zosyn by infectious disease but was stopped November 25, oncology started the patient on Decadron. I appreciate their help, follow-up on the recommendations.  Acute abdominal pain/right upper quadrant pain: Attribute patient's right upper quadrant pain to her  recent ultrasound-guided biopsy and effect of hepatomegaly on liver capsule. Suspect liver masses to be metastatic breast cancer. Pathology ispositive for metastatic BRCA. Palliative care input appreciated  BRCA with mets to liver: Palliative care consulted patient was seen November 23  Hypertension: Blood pressure is a little bit high due to pain. We will continue patient on losartan 50 mg daily. Cardiac diet. Pain control.  Transaminitis/liver mass: Attribute to metastatic breast cancer. Management per oncology,oncology consulted. As needed Dilaudid for pain.  DVT prophylaxis: SCD/Compression stockings  Code Status:     Code Status Orders  (From admission, onward)         Start     Ordered   12/21/18 1357  Full code  Continuous     12/21/18 1357        Code Status History    Date Active Date Inactive Code Status Order ID Comments User Context   12/18/2018 2314 12/20/2018 2118 Full Code [id number]  Rise Patience, MD ED   Advance Care Planning Activity     Family Communication: discussed in detail with patient Disposition Plan:   Patient remained inpatient for continued IV pain control, weaning oxygen.  Hopefully able to discharge in 1 to 2 days Consults called: None Admission status: Inpatient   Consultants:   Oncology, ID  Procedures:  Ct Abdomen Pelvis Wo Contrast  Addendum Date: 12/18/2018   ADDENDUM REPORT: 12/18/2018 20:18 ADDENDUM: These results were called by telephone on 12/18/2018 at 8:18 pm to provider [facility] , who verbally acknowledged these results. Electronically Signed   By: Lovena Le M.D.   On: 12/18/2018 20:18   Result  Date: 12/18/2018 CLINICAL DATA:  Abdominal pain and distension, undergoing chemotherapy for breast cancer, history of IBS and diverticulitis EXAM: CT ABDOMEN AND PELVIS WITHOUT CONTRAST TECHNIQUE: Multidetector CT imaging of the abdomen and pelvis was performed following the standard protocol without IV contrast.  COMPARISON:  CT abdomen pelvis 01/01/2018 FINDINGS: Lower chest: The patchy areas of subpleural ground-glass opacity could reflect atelectasis or early infection. Cardiac size is normal. Trace pericardial effusion, similar to prior. Hepatobiliary: Ill-defined regions of hypoattenuation are seen the anterior left lobe liver. Slightly nodular hepatic surface contour which is new from comparison exam. These features are both incompletely assessed in the absence of contrast media. Gallbladder appears largely contracted at the time of exam. No biliary ductal dilatation. Pancreas: Unremarkable. No pancreatic ductal dilatation or surrounding inflammatory changes. Spleen: Normal in size without focal abnormality. Adrenals/Urinary Tract: No suspicious adrenal lesions. No visible or contour deforming renal lesions. No urolithiasis or hydronephrosis mild circumferential bladder wall thickening. Stomach/Bowel: Distal esophagus, stomach and duodenal sweep are unremarkable. No small bowel wall thickening or dilatation. No evidence of obstruction. Appendix is not clearly identified. No pericecal inflammation. There is some mild mural thickening of the splenic flexure and descending colon remaining portions of the colon are free of mural thickening or dilatation. Vascular/Lymphatic: Upper abdominal venous collateralization and splenorenal shunting is noted. No suspicious or enlarged lymph nodes in the included lymphatic chains. Reproductive: Uterus is surgically absent. No concerning adnexal lesions. Other: Small volume of low-attenuation ascites noted in the low abdomen. Small volume of perihepatic low-attenuation ascites is present as well. Musculoskeletal: Multilevel degenerative changes are present in the imaged portions of the spine. No acute osseous abnormality or suspicious osseous lesion. IMPRESSION: 1. Interval development of cirrhotic stigmata including a nodular liver surface contour and increasing upper abdominal venous  collateralization. Small volume of ascites is noted as well. 2. Ill-defined regions of hypoattenuation are seen within the anterior left lobe liver, which are incompletely assessed in the absence of contrast media. Furthermore findings are worrisome in the setting of known malignancy and potential intrinsic liver disease. Consider further evaluation with MRI or ultrasound if patient is unable to tolerate CT contrast due to allergy. 3. Mild mural thickening of the splenic flexure and descending colon, could reflect change related to the additional cirrhotic findings/portal colopathy though should exclude a an acute colitis clinically. 4. Patchy areas of subpleural ground-glass opacity in the visualized lung bases could reflect atelectasis or early infection. 5. Mild circumferential bladder wall thickening, which may be related to underdistention. Correlate with urinalysis to exclude cystitis. 6. Unchanged trace pericardial effusion. Electronically Signed: By: Lovena Le M.D. On: 12/18/2018 20:07   Dg Chest 1 View  Result Date: 12/23/2018 CLINICAL DATA:  Cirrhosis, ascites, SIRS, history LEFT breast cancer, hypertension EXAM: CHEST  1 VIEW COMPARISON:  Portable exam 1016 hours compared to 12/21/2018 FINDINGS: RIGHT subclavian Port-A-Cath with tip projecting over superior RIGHT atrium, unchanged. Normal heart size, mediastinal contours, and pulmonary vascularity. Bibasilar atelectasis. Remaining lungs clear. No pleural effusion or pneumothorax. IMPRESSION: Bibasilar atelectasis. Electronically Signed   By: Lavonia Dana M.D.   On: 12/23/2018 10:58   Ct Angio Chest Pe W And/or Wo Contrast  Result Date: 12/21/2018 CLINICAL DATA:  48 y.o. female discharged yesterday for concerns of abdominal pain with a work-up regarding her liver as well as she is meeting SIRS criteria with feverPatient comes back today reports she continues to have significant pain and discomfort. She has been having ongoing and severe pain  located  in the mid to right upper abdomen, also she reports fairly significant pain when she takes a deep breath over the right lower lungShe has been feeling warm had a fever when she was in the hospital yesterday. Some nausea. No headaches. Tested negative for Covid a few days ago pain is located primarily in the right upper abdomen under the right rib cage. EXAM: CT ANGIOGRAPHY CHEST CT ABDOMEN AND PELVIS WITH CONTRAST TECHNIQUE: Multidetector CT imaging of the chest was performed using the standard protocol during bolus administration of intravenous contrast. Multiplanar CT image reconstructions and MIPs were obtained to evaluate the vascular anatomy. Multidetector CT imaging of the abdomen and pelvis was performed using the standard protocol during bolus administration of intravenous contrast. CONTRAST:  21m OMNIPAQUE IOHEXOL 350 MG/ML SOLN COMPARISON:  Chest CTA and abdomen and pelvis CT dated 12/19/2018. FINDINGS: CTA CHEST FINDINGS Cardiovascular: There is satisfactory opacification of the pulmonary arteries to the segmental level. No evidence of a pulmonary embolism. Mediastinum/Nodes: No enlarged mediastinal, hilar, or axillary lymph nodes. Thyroid gland, trachea, and esophagus demonstrate no significant findings. Lungs/Pleura: Small bilateral pleural effusions. There is dependent opacity in the lower lobes consistent with atelectasis. Small areas of ground-glass opacity and small lung nodules noted. Dominant nodule in the right upper lobe, image 46, series 4, 6 mm. Several other small nodules are new from the prior CT. Area of ground-glass opacity adjacent to the oblique fissure in the left upper lobe is new. No pneumothorax. Musculoskeletal: No chest wall abnormality. No acute or significant osseous findings. Review of the MIP images confirms the above findings. CT ABDOMEN and PELVIS FINDINGS Hepatobiliary: Enlarged liver. There are multiple hypoattenuating areas throughout the liver. These areas are  more apparent and more extensive than on the recent prior CT, particularly evident at the dome of segment 4A, along the anterior aspect of the right lobe and medial segment of the left lobe and along the inferior aspect of the right lobe, segment 5 and 6. Gallbladder is mostly collapsed. There is increased attenuation material within the gallbladder similar to the prior CT. No bile duct dilation Pancreas: Unremarkable. No pancreatic ductal dilatation or surrounding inflammatory changes. Spleen: Normal in size without focal abnormality. Adrenals/Urinary Tract: No adrenal masses. Right kidney displaced inferiorly by the enlarged liver. Symmetric renal enhancement and excretion. No renal masses, stones or hydronephrosis. Normal ureters. Bladder wall is prominent, unchanged from the recent prior study. No bladder masses. Stomach/Bowel: Stomach is unremarkable, mostly decompressed. Small bowel and colon are normal in caliber. No wall thickening or inflammation. Appendix not discretely seen. No findings of appendicitis. Vascular/Lymphatic: As noted on the prior CT, left portal vein is attenuated, but does show enhancement. No evidence of thrombosis. There are venous collaterals in the upper abdomen. Aorta is unremarkable. No enlarged lymph nodes. Prominent Peri celiac node noted on the prior study is stable. Reproductive: Status post hysterectomy. No adnexal masses. Other: Small amount of ascites increased when compared to the prior CT. Musculoskeletal: No acute or significant osseous findings. Review of the MIP images confirms the above findings. IMPRESSION: CHEST CTA 1. No evidence of a pulmonary embolism. 2. Small pleural effusions, new from the prior CT. There is increased opacity in the lower lobes that is consistent with atelectasis. There several new small nodules and small areas of ground-glass opacity when compared to the prior CT. Suspect these are inflammatory given change from the recent exam. ABDOMEN AND  PELVIS CT 1. Multiple hypoattenuating liver lesions. These appear to have progressed when  compared to the prior CT, although this change could be due to differences in contrast timing. The liver is enlarged, stable from the recent exam. Findings are concerning for multifocal hepatocellular carcinoma. Follow-up liver MRI without and with contrast, when the patient can tolerate the procedure, is recommended. 2. Small amount ascites has increased in quantity from the prior study. 3. There are vascular collaterals in the upper abdomen, stable. Electronically Signed   By: Lajean Manes M.D.   On: 12/21/2018 18:36   Ct Angio Chest Pe W And/or Wo Contrast  Result Date: 12/19/2018 CLINICAL DATA:  Chest pain, complex, intermediate/high prob of ACS/PE/AAS; Abd distension Epigastric pain. Exertional dyspnea. Cough. Weakness. History of breast cancer. EXAM: CT ANGIOGRAPHY CHEST CT ABDOMEN AND PELVIS WITH CONTRAST TECHNIQUE: Multidetector CT imaging of the chest was performed using the standard protocol during bolus administration of intravenous contrast. Multiplanar CT image reconstructions and MIPs were obtained to evaluate the vascular anatomy. Multidetector CT imaging of the abdomen and pelvis was performed using the standard protocol during bolus administration of intravenous contrast. No reported contrast reaction. CONTRAST:  146m OMNIPAQUE IOHEXOL 350 MG/ML SOLN COMPARISON:  Abdominal CT without contrast yesterday. Chest CT 01/01/2018 FINDINGS: CTA CHEST FINDINGS Cardiovascular: There are no filling defects within the pulmonary arteries to suggest pulmonary embolus. Thoracic aorta is normal in caliber without dissection. Left vertebral artery arises directly from the thoracic aorta, variant arch anatomy. Heart is normal in size. No pericardial effusion. Right chest port in place with tip in the SVC. Mediastinum/Nodes: No mediastinal or hilar adenopathy. No visualized thyroid nodule. No esophageal wall thickening.  Lungs/Pleura: Mild patchy areas of subpleural nodular and ground-glass opacities. Basilar findings are unchanged from CT yesterday. 5 mm right upper lobe pulmonary nodule series 6, image 41. Sub solid nodularity slightly more inferiorly in the right lung series 6, image 45 measuring 8 mm. Tiny 2 mm nodule in the right upper lung series 6, image 32, may be fissural. Pulmonary nodules are new from prior chest CT. Perifissural in subpleural opacity in the right upper lobe, also series 6, image 32, nonspecific. No pulmonary edema. No pleural fluid. Trachea and mainstem bronchi are patent. Musculoskeletal: There are no acute or suspicious osseous abnormalities. Review of the MIP images confirms the above findings. CT ABDOMEN and PELVIS FINDINGS Hepatobiliary: Hepatic parenchyma diffusely heterogeneous with nodular contours. Liver is increased in size from December 2019 CT. Gallbladder is decompressed and not well assessed, intraluminal high density may be stones or sludge. Questionable wall thickening may be related to nondistention. Pancreas: No ductal dilatation or inflammation. Spleen: Normal in size without focal abnormality. Adrenals/Urinary Tract: Normal adrenal glands. No hydronephrosis or perinephric edema. Homogeneous renal enhancement with symmetric excretion on delayed phase imaging. Urinary bladder is partially distended, mild bladder wall thickening. Stomach/Bowel: Lack of enteric contrast and paucity of intra-abdominal fat limits detailed bowel assessment. The stomach is nondistended. Questionable pre-pyloric gastric wall thickening. No small bowel obstruction or inflammatory change. Appendix not discretely visualized, no pericecal inflammation to suggest appendicitis. Transverse colon is tortuous. Mild colonic wall thickening at the splenic flexure, colon is decompressed, this is not well assessed. Questionable areas of descending colonic wall thickening, for example series 11, image 61.  Vascular/Lymphatic: Right portal vein is patent. The left portal vein is attenuated without evidence of thrombus. Tortuous splenic vein with prominent collaterals in the left upper quadrant and left retroperitoneum. 9 mm perigastric lymph node series 11, image 29, nonspecific. No pelvic adenopathy. Reproductive: Uterus surgically absent. Left ovary tentatively  visualized and normal. Right ovary not definitively seen. No obvious adnexal mass. Other: Small volume of pelvic ascites measuring simple fluid density, similar to yesterday. Small perihepatic ascites anteriorly. No free air. Musculoskeletal: There are no acute or suspicious osseous abnormalities. Scattered bone islands in the pelvis. Review of the MIP images confirms the above findings. IMPRESSION: CT chest: 1. No pulmonary embolus. 2. Mild patchy areas of subpleural nodular and ground-glass opacities in both lungs. Slightly more confluent subpleural/perifissural right upper lobe opacity. Findings may be due to atelectasis or infection. 3. Small pulmonary nodules are new from December, infectious/inflammatory or metastatic. CT abdomen/pelvis: 1. Diffusely heterogeneous liver with nodular contours. Liver has increased in size since December 2019 CT. This may be due to underlying cirrhotic change versus diffuse metastatic disease. Recommend further evaluation with abdominal MRI. Left upper quadrant and retroperitoneal collaterals can be seen with portal hypertension. Left portal vein is attenuated, however no discrete thrombus. 2. Questionable pre-pyloric gastric wall thickening, peptic ulcer disease versus gastritis. 3. Bladder wall thickening, small volume abdominopelvic ascites, and areas of colonic wall thickening at the splenic flexure and descending colon are unchanged from CT yesterday. 4. Gallbladder is decompressed and not well assessed, intraluminal high density may be stones or sludge. Questionable gallbladder wall thickening may be related to  nondistention or liver disease. This could be further evaluated with MRI or right upper quadrant ultrasound. Electronically Signed   By: Keith Rake M.D.   On: 12/19/2018 05:33   Mr Abdomen W Wo Contrast  Result Date: 12/19/2018 CLINICAL DATA:  Abdominal pain in a patient with breast cancer. EXAM: MRI ABDOMEN WITHOUT AND WITH CONTRAST TECHNIQUE: Multiplanar multisequence MR imaging of the abdomen was performed both before and after the administration of intravenous contrast. CONTRAST:  73m GADAVIST GADOBUTROL 1 MMOL/ML IV SOLN COMPARISON:  CT of 12/19/2018 FINDINGS: Lower chest: Limited assessment of the lower chest on MRI is unremarkable. Hepatobiliary: Diffuse infiltration of much of the liver, nearly the entire left hepatic lobe, medial section and lateral section in addition to anterior right hepatic lobe with confluent infiltrative restricted diffusion and heterogeneous enhancement measuring approximately 21 by 7.4 cm. Diffuse metastatic disease is strongly suspected. Boundaries are well-demarcated without adjacent edema in the bordering hepatic parenchyma Discrete hepatic lesions are also scattered about the right hepatic lobe largest in the dome of the right hemi liver measuring approximately 2.5 cm in greatest dimension. These discrete lesions display a targetoid appearance. There are numerous lesions in the right hepatic lobe, lesions are found in all hepatic subsegment. Pancreas: No mass, inflammatory changes, or other parenchymal abnormality identified. Mild ductal distension in the tail of the pancreas may be due to adjacent mass effect. No visible pancreatic lesion is noted. Spleen:  Within normal limits in size and appearance. Adrenals/Urinary Tract: No masses identified. No evidence of hydronephrosis. Stomach/Bowel: Limited assessment of the gastrointestinal tract without signs of acute process. Vascular/Lymphatic: Left-sided peri-renal venous collaterals of uncertain significance perhaps  related to elevated portal pressures in the setting of diffuse hepatic involvement. 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 Right portal vein is patent. Scattered small lymph nodes in the upper abdomen. There is either slow flow or thrombus within the main portal vein at the level of the splenic portal confluence extending in the splenic vein. The SMV is patent. There is mass effect upon the vessel at the level of the splenic portal confluence due to hepatic enlargement. This is best seen on subtraction images, image 54, series 20. Note that slow flow  in an otherwise opacified vessel can cause artifact with a similar appearance however, this is seen on contrasted and non contrasted imaging with a similar appearance. Other:  None. Musculoskeletal: No signs of acute or destructive bone process. IMPRESSION: 1. Diffuse infiltration of much of the liver, nearly the entire left hepatic lobe, with confluent infiltrative restricted diffusion and heterogeneous enhancement measuring approximately 21 by 7.4 cm. Diffuse metastatic disease is strongly suspected with multifocal lesions in the right hepatic lobe. Superimposed infection is difficult to exclude though areas at the boundary of these lesions show no overt signs of edema. 2. Highly narrowed or occluded left portal vein with potential thrombus versus slow flow in the splenoportal confluence. This was patent on the exam of 12/19/2018; however, given above findings a venous phase CT or even ultrasound hepatic Doppler with special attention to the splenic portal confluence may be helpful to exclude this possibility, finding is also exhibited on TrueFISP sequence (image 24, series 10). 3. 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 4. Mild ductal distension in the tail of the pancreas, potentially due to mass effect. Attention on follow-up. 5. Critical Value/emergent results were  called by telephone at the time of interpretation on 12/19/2018 at 6:43 pm to [facility] , who verbally acknowledged these results. Electronically Signed   By: Zetta Bills M.D.   On: 12/19/2018 18:24   US Abdomen Complete  Result Date: 12/19/2018 CLINICAL DATA:  Abdominal pain EXAM: ABDOMEN ULTRASOUND COMPLETE COMPARISON:  MRI earlier today FINDINGS: Gallbladder: Gallbladder is contracted with associated mild wall thickening measuring up to 4 mm. Sludge noted within the gallbladder. No visible stones. Common bile duct: Diameter: Normal caliber, 3 mm Liver: Diffusely heterogeneous echotexture throughout the liver corresponding to the abnormality seen on today's MRI suspicious forinfiltrating mass. Main portal vein is patent on color Doppler imaging with normal direction of blood flow towards the liver. The area of possible thrombus seen on MRI at the level of the splenic portal confluence not visualized. IVC: No abnormality visualized. Pancreas: Visualized portion unremarkable. Spleen: Size and appearance within normal limits. Right Kidney: Length: 11.0 cm. Echogenicity within normal limits. No mass or hydronephrosis visualized. Left Kidney: Length: 10.5 cm. Echogenicity within normal limits. No mass or hydronephrosis visualized. Abdominal aorta: No aneurysm visualized. Other findings: None. IMPRESSION: Heterogeneous appearance throughout much of the left hepatic lobe as seen on today's MRI concerning for infiltrating mass/tumor. Main portal vein is patent. Contracted gallbladder with gallbladder wall thickening likely related to contracted state. Sludge within the gallbladder. No sonographic Murphy sign or visible stones. Electronically Signed   By: Rolm Baptise M.D.   On: 12/19/2018 20:35   Ct Abdomen Pelvis W Contrast  Result Date: 12/21/2018 CLINICAL DATA:  48 y.o. female discharged yesterday for concerns of abdominal pain with a work-up regarding her liver as well as she is meeting SIRS  criteria with feverPatient comes back today reports she continues to have significant pain and discomfort. She has been having ongoing and severe pain located in the mid to right upper abdomen, also she reports fairly significant pain when she takes a deep breath over the right lower lungShe has been feeling warm had a fever when she was in the hospital yesterday. Some nausea. No headaches. Tested negative for Covid a few days ago pain is located primarily in the right upper abdomen under the right rib cage. EXAM: CT ANGIOGRAPHY CHEST CT ABDOMEN AND PELVIS  WITH CONTRAST TECHNIQUE: Multidetector CT imaging of the chest was performed using the standard protocol during bolus administration of intravenous contrast. Multiplanar CT image reconstructions and MIPs were obtained to evaluate the vascular anatomy. Multidetector CT imaging of the abdomen and pelvis was performed using the standard protocol during bolus administration of intravenous contrast. CONTRAST:  21m OMNIPAQUE IOHEXOL 350 MG/ML SOLN COMPARISON:  Chest CTA and abdomen and pelvis CT dated 12/19/2018. FINDINGS: CTA CHEST FINDINGS Cardiovascular: There is satisfactory opacification of the pulmonary arteries to the segmental level. No evidence of a pulmonary embolism. Mediastinum/Nodes: No enlarged mediastinal, hilar, or axillary lymph nodes. Thyroid gland, trachea, and esophagus demonstrate no significant findings. Lungs/Pleura: Small bilateral pleural effusions. There is dependent opacity in the lower lobes consistent with atelectasis. Small areas of ground-glass opacity and small lung nodules noted. Dominant nodule in the right upper lobe, image 46, series 4, 6 mm. Several other small nodules are new from the prior CT. Area of ground-glass opacity adjacent to the oblique fissure in the left upper lobe is new. No pneumothorax. Musculoskeletal: No chest wall abnormality. No acute or significant osseous findings. Review of the MIP images confirms the above  findings. CT ABDOMEN and PELVIS FINDINGS Hepatobiliary: Enlarged liver. There are multiple hypoattenuating areas throughout the liver. These areas are more apparent and more extensive than on the recent prior CT, particularly evident at the dome of segment 4A, along the anterior aspect of the right lobe and medial segment of the left lobe and along the inferior aspect of the right lobe, segment 5 and 6. Gallbladder is mostly collapsed. There is increased attenuation material within the gallbladder similar to the prior CT. No bile duct dilation Pancreas: Unremarkable. No pancreatic ductal dilatation or surrounding inflammatory changes. Spleen: Normal in size without focal abnormality. Adrenals/Urinary Tract: No adrenal masses. Right kidney displaced inferiorly by the enlarged liver. Symmetric renal enhancement and excretion. No renal masses, stones or hydronephrosis. Normal ureters. Bladder wall is prominent, unchanged from the recent prior study. No bladder masses. Stomach/Bowel: Stomach is unremarkable, mostly decompressed. Small bowel and colon are normal in caliber. No wall thickening or inflammation. Appendix not discretely seen. No findings of appendicitis. Vascular/Lymphatic: As noted on the prior CT, left portal vein is attenuated, but does show enhancement. No evidence of thrombosis. There are venous collaterals in the upper abdomen. Aorta is unremarkable. No enlarged lymph nodes. Prominent Peri celiac node noted on the prior study is stable. Reproductive: Status post hysterectomy. No adnexal masses. Other: Small amount of ascites increased when compared to the prior CT. Musculoskeletal: No acute or significant osseous findings. Review of the MIP images confirms the above findings. IMPRESSION: CHEST CTA 1. No evidence of a pulmonary embolism. 2. Small pleural effusions, new from the prior CT. There is increased opacity in the lower lobes that is consistent with atelectasis. There several new small nodules  and small areas of ground-glass opacity when compared to the prior CT. Suspect these are inflammatory given change from the recent exam. ABDOMEN AND PELVIS CT 1. Multiple hypoattenuating liver lesions. These appear to have progressed when compared to the prior CT, although this change could be due to differences in contrast timing. The liver is enlarged, stable from the recent exam. Findings are concerning for multifocal hepatocellular carcinoma. Follow-up liver MRI without and with contrast, when the patient can tolerate the procedure, is recommended. 2. Small amount ascites has increased in quantity from the prior study. 3. There are vascular collaterals in the upper abdomen, stable. Electronically Signed  By: Lajean Manes M.D.   On: 12/21/2018 18:36   Ct Abdomen Pelvis W Contrast  Result Date: 12/19/2018 CLINICAL DATA:  Chest pain, complex, intermediate/high prob of ACS/PE/AAS; Abd distension Epigastric pain. Exertional dyspnea. Cough. Weakness. History of breast cancer. EXAM: CT ANGIOGRAPHY CHEST CT ABDOMEN AND PELVIS WITH CONTRAST TECHNIQUE: Multidetector CT imaging of the chest was performed using the standard protocol during bolus administration of intravenous contrast. Multiplanar CT image reconstructions and MIPs were obtained to evaluate the vascular anatomy. Multidetector CT imaging of the abdomen and pelvis was performed using the standard protocol during bolus administration of intravenous contrast. No reported contrast reaction. CONTRAST:  142m OMNIPAQUE IOHEXOL 350 MG/ML SOLN COMPARISON:  Abdominal CT without contrast yesterday. Chest CT 01/01/2018 FINDINGS: CTA CHEST FINDINGS Cardiovascular: There are no filling defects within the pulmonary arteries to suggest pulmonary embolus. Thoracic aorta is normal in caliber without dissection. Left vertebral artery arises directly from the thoracic aorta, variant arch anatomy. Heart is normal in size. No pericardial effusion. Right chest port in place  with tip in the SVC. Mediastinum/Nodes: No mediastinal or hilar adenopathy. No visualized thyroid nodule. No esophageal wall thickening. Lungs/Pleura: Mild patchy areas of subpleural nodular and ground-glass opacities. Basilar findings are unchanged from CT yesterday. 5 mm right upper lobe pulmonary nodule series 6, image 41. Sub solid nodularity slightly more inferiorly in the right lung series 6, image 45 measuring 8 mm. Tiny 2 mm nodule in the right upper lung series 6, image 32, may be fissural. Pulmonary nodules are new from prior chest CT. Perifissural in subpleural opacity in the right upper lobe, also series 6, image 32, nonspecific. No pulmonary edema. No pleural fluid. Trachea and mainstem bronchi are patent. Musculoskeletal: There are no acute or suspicious osseous abnormalities. Review of the MIP images confirms the above findings. CT ABDOMEN and PELVIS FINDINGS Hepatobiliary: Hepatic parenchyma diffusely heterogeneous with nodular contours. Liver is increased in size from December 2019 CT. Gallbladder is decompressed and not well assessed, intraluminal high density may be stones or sludge. Questionable wall thickening may be related to nondistention. Pancreas: No ductal dilatation or inflammation. Spleen: Normal in size without focal abnormality. Adrenals/Urinary Tract: Normal adrenal glands. No hydronephrosis or perinephric edema. Homogeneous renal enhancement with symmetric excretion on delayed phase imaging. Urinary bladder is partially distended, mild bladder wall thickening. Stomach/Bowel: Lack of enteric contrast and paucity of intra-abdominal fat limits detailed bowel assessment. The stomach is nondistended. Questionable pre-pyloric gastric wall thickening. No small bowel obstruction or inflammatory change. Appendix not discretely visualized, no pericecal inflammation to suggest appendicitis. Transverse colon is tortuous. Mild colonic wall thickening at the splenic flexure, colon is decompressed,  this is not well assessed. Questionable areas of descending colonic wall thickening, for example series 11, image 61. Vascular/Lymphatic: Right portal vein is patent. The left portal vein is attenuated without evidence of thrombus. Tortuous splenic vein with prominent collaterals in the left upper quadrant and left retroperitoneum. 9 mm perigastric lymph node series 11, image 29, nonspecific. No pelvic adenopathy. Reproductive: Uterus surgically absent. Left ovary tentatively visualized and normal. Right ovary not definitively seen. No obvious adnexal mass. Other: Small volume of pelvic ascites measuring simple fluid density, similar to yesterday. Small perihepatic ascites anteriorly. No free air. Musculoskeletal: There are no acute or suspicious osseous abnormalities. Scattered bone islands in the pelvis. Review of the MIP images confirms the above findings. IMPRESSION: CT chest: 1. No pulmonary embolus. 2. Mild patchy areas of subpleural nodular and ground-glass opacities in both lungs. Slightly  more confluent subpleural/perifissural right upper lobe opacity. Findings may be due to atelectasis or infection. 3. Small pulmonary nodules are new from December, infectious/inflammatory or metastatic. CT abdomen/pelvis: 1. Diffusely heterogeneous liver with nodular contours. Liver has increased in size since December 2019 CT. This may be due to underlying cirrhotic change versus diffuse metastatic disease. Recommend further evaluation with abdominal MRI. Left upper quadrant and retroperitoneal collaterals can be seen with portal hypertension. Left portal vein is attenuated, however no discrete thrombus. 2. Questionable pre-pyloric gastric wall thickening, peptic ulcer disease versus gastritis. 3. Bladder wall thickening, small volume abdominopelvic ascites, and areas of colonic wall thickening at the splenic flexure and descending colon are unchanged from CT yesterday. 4. Gallbladder is decompressed and not well  assessed, intraluminal high density may be stones or sludge. Questionable gallbladder wall thickening may be related to nondistention or liver disease. This could be further evaluated with MRI or right upper quadrant ultrasound. Electronically Signed   By: Keith Rake M.D.   On: 12/19/2018 05:33   US Biopsy (liver)  Result Date: 12/20/2018 INDICATION: History of left breast carcinoma with imaging evidence of probable diffuse metastatic disease in the liver, more prominently in the left lobe. The patient presents for biopsy. EXAM: ULTRASOUND GUIDED CORE BIOPSY OF LIVER MEDICATIONS: None. ANESTHESIA/SEDATION: Fentanyl 100 mcg IV; Versed 2.0 mg IV Moderate Sedation Time:  20 minutes. The patient was continuously monitored during the procedure by the interventional radiology nurse under my direct supervision. PROCEDURE: The procedure, risks, benefits, and alternatives were explained to the patient. Questions regarding the procedure were encouraged and answered. The patient understands and consents to the procedure. A time-out was performed prior to initiating the procedure. Ultrasound was used to localize lesions within the liver. The anterior abdominal wall was prepped with chlorhexidine in a sterile fashion, and a sterile drape was applied covering the operative field. A sterile gown and sterile gloves were used for the procedure. Local anesthesia was provided with 1% Lidocaine. Under direct ultrasound guidance, a 17 gauge trocar needle was advanced into the left lobe of the liver. After confirming needle tip position, coaxial 18 gauge core biopsy samples were obtained. A total of 3 samples were obtained and submitted in formalin. A slurry of Gel-Foam pledgets was then slowly injected via the outer needle as the needle was retracted. Additional ultrasound was performed. COMPLICATIONS: None immediate. FINDINGS: Ultrasound demonstrates heterogeneous appearance of the liver with ill-defined lesions throughout  the left lobe. Solid core biopsy samples were obtained by sampling lesions within the lateral segment of the left lobe. IMPRESSION: Ultrasound-guided core biopsy performed of hepatic lesions within the left lobe. Electronically Signed   By: Aletta Edouard M.D.   On: 12/20/2018 13:44   Dg Chest Portable 1 View  Result Date: 12/21/2018 CLINICAL DATA:  Pleuritic right chest pain EXAM: PORTABLE CHEST 1 VIEW COMPARISON:  12/18/2018 chest radiograph. FINDINGS: Stable right subclavian Port-A-Cath terminating over the right atrium. Stable cardiomediastinal silhouette with normal heart size. No pneumothorax. No pleural effusion. No pulmonary edema. Curvilinear left lung base opacities. No acute consolidative airspace disease. IMPRESSION: Curvilinear left lung base opacities, favor scarring or atelectasis. No acute consolidative airspace disease. Electronically Signed   By: Ilona Sorrel M.D.   On: 12/21/2018 13:06   Dg Chest Portable 1 View  Result Date: 12/18/2018 CLINICAL DATA:  Fever abdominal pain EXAM: PORTABLE CHEST 1 VIEW COMPARISON:  12/31/2017 FINDINGS: Right-sided central venous port with tip over the cavoatrial region. No focal airspace disease or effusion. Normal  heart size. No pneumothorax. IMPRESSION: No active disease. Electronically Signed   By: Donavan Foil M.D.   On: 12/18/2018 19:49     Antimicrobials:   Zosyn November 23 to November 25   Subjective: Patient reports feeling very short of breath, Very anxious and resistant to discharge home today  Objective: Vitals:   12/26/18 0512 12/26/18 1100 12/26/18 1151 12/26/18 1152  BP: (!) 136/92   118/83  Pulse: 61  (!) 117 (!) 117  Resp: 20   17  Temp: (!) 97.5 F (36.4 C)   98 F (36.7 C)  TempSrc: Oral   Oral  SpO2: 98% 96% 91% 90%  Weight:      Height:        Intake/Output Summary (Last 24 hours) at 12/26/2018 1155 Last data filed at 12/25/2018 2139 Gross per 24 hour  Intake 118 ml  Output --  Net 118 ml   Filed  Weights   12/21/18 0958  Weight: 64.9 kg    Examination:  General exam: Appears calm and comfortable  Respiratory system: Good inspiratory effort although limited secondary to pain with moderate air movement, mild rales at bases Cardiovascular system: S1 & S2 heard, RRR. No JVD, murmurs, rubs, gallops or clicks. No pedal edema. Gastrointestinal system: Tender to palpation right upper quadrant distant bowel sounds Central nervous system: Alert and oriented. No focal neurological deficits. Extremities: Warm well perfused, no contractures moves all 4 extremities Skin: No rashes, lesions or ulcers Psychiatry: Judgement and insight appear normal. Mood & affect appropriate.     Data Reviewed: I have personally reviewed following labs and imaging studies  CBC: Recent Labs  Lab 12/21/18 1009 12/22/18 0555 12/23/18 0414 12/23/18 2102 12/25/18 0400  WBC 6.9 4.6 5.4 3.9* 4.6  NEUTROABS  --   --   --  3.0  --   HGB 11.8* 11.3* 10.2* 10.3* 9.0*  HCT 33.2* 33.3* 29.8* 28.5* 26.5*  MCV 92.0 96.0 95.5 91.3 94.0  PLT 62* 39* 45* 38* 49*   Basic Metabolic Panel: Recent Labs  Lab 12/21/18 1009 12/22/18 0822 12/23/18 0414 12/23/18 2102 12/25/18 0400  NA 129* 133* 130* 131* 134*  K 4.0 4.1 4.2 3.6 3.4*  CL 91* 94* 95* 94* 96*  CO2 _0 GLUCOSE 139* 114* 131* 165* 110*  BUN _1 CREATININE 0.72 0.45 0.49 0.63 0.46  CALCIUM 8.6* 8.7* 8.2* 8.2* 8.2*   GFR: Estimated Creatinine Clearance: 78.8 mL/min (by C-G formula based on SCr of 0.46 mg/dL). Liver Function Tests: Recent Labs  Lab 12/21/18 1009 12/23/18 0414 12/23/18 2102  AST 269* 223* 199*  ALT 158* 138* 124*  ALKPHOS 116 98 101  BILITOT 1.1 0.9 1.0  PROT 6.9 5.6* 5.5*  ALBUMIN 3.5 2.8* 2.6*   Recent Labs  Lab 12/21/18 1009  LIPASE 27   No results for input(s): AMMONIA in the last 168 hours. Coagulation Profile: Recent Labs  Lab 12/19/18 1355 12/20/18 0457  INR 1.3* 1.2   Cardiac  Enzymes: No results for input(s): CKTOTAL, CKMB, CKMBINDEX, TROPONINI in the last 168 hours. BNP (last 3 results) No results for input(s): PROBNP in the last 8760 hours. HbA1C: No results for input(s): HGBA1C in the last 72 hours. CBG: No results for input(s): GLUCAP in the last 168 hours. Lipid Profile: No results for input(s): CHOL, HDL, LDLCALC, TRIG, CHOLHDL, LDLDIRECT in the last 72 hours. Thyroid Function Tests: No results for input(s): TSH, T4TOTAL, FREET4, T3FREE, THYROIDAB in  the last 72 hours. Anemia Panel: No results for input(s): VITAMINB12, FOLATE, FERRITIN, TIBC, IRON, RETICCTPCT in the last 72 hours. Sepsis Labs: Recent Labs  Lab 12/22/18 1756 12/23/18 0414 12/23/18 1004 12/23/18 1229 12/23/18 2102 12/24/18 0057  PROCALCITON 0.75 0.90  --   --   --  1.59  LATICACIDVEN  --   --  2.4* 2.9* 3.2* 2.0*    Recent Results (from the past 240 hour(s))  SARS CORONAVIRUS 2 (TAT 6-24 HRS) Nasopharyngeal Nasopharyngeal Swab     Status: None   Collection Time: 12/18/18  7:20 PM   Specimen: Nasopharyngeal Swab  Result Value Ref Range Status   SARS Coronavirus 2 NEGATIVE NEGATIVE Final    Comment: (NOTE) SARS-CoV-2 target nucleic acids are NOT DETECTED. The SARS-CoV-2 RNA is generally detectable in upper and lower respiratory specimens during the acute phase of infection. Negative results do not preclude SARS-CoV-2 infection, do not rule out co-infections with other pathogens, and should not be used as the sole basis for treatment or other patient management decisions. Negative results must be combined with clinical observations, patient history, and epidemiological information. The expected result is Negative. Fact Sheet for Patients: SugarRoll.be Fact Sheet for Healthcare Providers: https://www.woods-mathews.com/ This test is not yet approved or cleared by the [place] FDA and  has been authorized for detection and/or  diagnosis of SARS-CoV-2 by FDA under an Emergency Use Authorization (EUA). This EUA will remain  in effect (meaning this test can be used) for the duration of the COVID-19 declaration under Section 56 4(b)(1) of the Act, 21 U.S.C. section 360bbb-3(b)(1), unless the authorization is terminated or revoked sooner. Performed at [facility], [place] [address] [place], [place] [address]   Blood culture (routine x 2)     Status: None   Collection Time: 12/18/18  7:57 PM   Specimen: BLOOD  Result Value Ref Range Status   Specimen Description BLOOD PORTA CATH  Final   Special Requests   Final    BOTTLES DRAWN AEROBIC AND ANAEROBIC Blood Culture adequate volume   Culture   Final    NO GROWTH 5 DAYS Performed at [facility], [address], [place], [place] [address]    Report Status 12/23/2018 FINAL  Final  Blood culture (routine x 2)     Status: None   Collection Time: 12/18/18  7:57 PM   Specimen: BLOOD  Result Value Ref Range Status   Specimen Description BLOOD PORTA CATH  Final   Special Requests   Final    BOTTLES DRAWN AEROBIC AND ANAEROBIC Blood Culture adequate volume   Culture   Final    NO GROWTH 5 DAYS Performed at [facility], [address] [place], [place] [address]    Report Status 12/23/2018 FINAL  Final  SARS CORONAVIRUS 2 (TAT 6-24 HRS) Nasopharyngeal Nasopharyngeal Swab     Status: None   Collection Time: 12/21/18  4:24 PM   Specimen: Nasopharyngeal Swab  Result Value Ref Range Status   SARS Coronavirus 2 NEGATIVE NEGATIVE Final    Comment: (NOTE) SARS-CoV-2 target nucleic acids are NOT DETECTED. The SARS-CoV-2 RNA is generally detectable in upper and lower respiratory specimens during the acute phase of infection. Negative results do not preclude SARS-CoV-2 infection, do not rule out co-infections with other pathogens, and should not be used as the sole basis for treatment or other patient management decisions. Negative results must  be combined with clinical observations, patient history, and epidemiological information. The expected result is  Negative. Fact Sheet for Patients: SugarRoll.be Fact Sheet for Healthcare Providers: https://www.woods-mathews.com/ This test is not yet approved or cleared by the [place] FDA and  has been authorized for detection and/or diagnosis of SARS-CoV-2 by FDA under an Emergency Use Authorization (EUA). This EUA will remain  in effect (meaning this test can be used) for the duration of the COVID-19 declaration under Section 56 4(b)(1) of the Act, 21 U.S.C. section 360bbb-3(b)(1), unless the authorization is terminated or revoked sooner. Performed at [facility], [place] [address], [place], [place] [address]   CULTURE, BLOOD (ROUTINE X 2) w Reflex to ID Panel     Status: None   Collection Time: 12/21/18  4:24 PM   Specimen: BLOOD  Result Value Ref Range Status   Specimen Description BLOOD R WRIST  Final   Special Requests   Final    BOTTLES DRAWN AEROBIC AND ANAEROBIC Blood Culture adequate volume   Culture   Final    NO GROWTH 5 DAYS Performed at [facility], [address] [place], [place] [address]    Report Status 12/26/2018 FINAL  Final  CULTURE, BLOOD (ROUTINE X 2) w Reflex to ID Panel     Status: None   Collection Time: 12/21/18  4:24 PM   Specimen: BLOOD  Result Value Ref Range Status   Specimen Description BLOOD RAC  Final   Special Requests   Final    BOTTLES DRAWN AEROBIC AND ANAEROBIC Blood Culture adequate volume   Culture   Final    NO GROWTH 5 DAYS Performed at [facility], [address] [place], [place] [address]    Report Status 12/26/2018 FINAL  Final  CULTURE, BLOOD (ROUTINE X 2) w Reflex to ID Panel     Status: None (Preliminary result)   Collection Time: 12/23/18 10:04 AM   Specimen: BLOOD  Result Value Ref Range Status   Specimen Description BLOOD BLOOD RIGHT HAND  Final    Special Requests   Final    BOTTLES DRAWN AEROBIC AND ANAEROBIC Blood Culture adequate volume   Culture   Final    NO GROWTH 3 DAYS Performed at [facility], [address], [place], [place] [address]    Report Status PENDING  Incomplete  CULTURE, BLOOD (ROUTINE X 2) w Reflex to ID Panel     Status: None (Preliminary result)   Collection Time: 12/23/18 10:11 AM   Specimen: BLOOD  Result Value Ref Range Status   Specimen Description BLOOD BLOOD RIGHT HAND  Final   Special Requests   Final    BOTTLES DRAWN AEROBIC AND ANAEROBIC Blood Culture results may not be optimal due to an excessive volume of blood received in culture bottles   Culture   Final    NO GROWTH 3 DAYS Performed at [facility], [address] [place], [place] [address]    Report Status PENDING  Incomplete         Radiology Studies: No results found.      Scheduled Meds:  cholecalciferol  2,000 Units Oral QPM   dexamethasone  8 mg Oral Q8H   feeding supplement (ENSURE ENLIVE)  237 mL Oral BID BM   fluticasone  1 spray Each Nare Daily   losartan  50 mg Oral Daily   montelukast  10 mg Oral QHS   pantoprazole  40 mg Oral Daily   polyethylene glycol  17 g Oral Daily   sucralfate  1 g Oral TID WC &  HS   Continuous Infusions:  sodium chloride Stopped (12/25/18 0153)     LOS: 5 days    Time spent: 47 min    Nicolette Bang, MD [facility]  If 7PM-7AM, please contact night-coverage  12/26/2018, 11:55 AM

## 2018-12-26 NOTE — Progress Notes (Signed)
[place]  [phone] [phone]  ID: Shelby [name] OB: 1970-03-20  MR#: [id number]  [id number]  Patient Care Team: Jerrol Banana., MD as PCP - General (Family Medicine) [name], MD as Consulting Physician (Cardiology) Alfonzo Feller, RN as [place] Management  CHIEF COMPLAINT: Stage IV triple negative breast cancer with liver metastasis.  INTERVAL HISTORY: Patient continues to have shortness of breath with oxygen requirement, but otherwise feels well.  She has had no fever for greater than 24 hours.  Pain is well controlled.  REVIEW OF SYSTEMS:   Review of Systems  Constitutional: Positive for malaise/fatigue. Negative for fever and weight loss.  Respiratory: Positive for shortness of breath. Negative for cough and hemoptysis.   Cardiovascular: Negative.  Negative for chest pain and leg swelling.  Gastrointestinal: Negative.  Negative for abdominal pain.  Genitourinary: Negative.  Negative for dysuria.  Musculoskeletal: Negative.  Negative for back pain.  Skin: Negative.  Negative for rash.  Neurological: Positive for weakness. Negative for dizziness, focal weakness and headaches.  Psychiatric/Behavioral: Negative.  The patient is not nervous/anxious.     As per HPI. Otherwise, a complete review of systems is negative.  PAST MEDICAL HISTORY: Past Medical History:  Diagnosis Date   Anemia    h/o with pregnancy   Anxiety    Asthma    allergy induced-no inhaler   Cancer ([place])    breast left    Complication of anesthesia    Environmental allergies    Family history of adverse reaction to anesthesia    son-breathing problems-coded 1st time when he was 2 and had another surgery at 22 and had to be admitted for breathing problems   Family history of breast cancer    GERD (gastroesophageal reflux disease)    Headache    migraines   Hypertension    Mitral valve disease      Mitral valve prolapse    Painful menstrual periods    PONV (postoperative nausea and vomiting)    Vertigo     PAST SURGICAL HISTORY: Past Surgical History:  Procedure Laterality Date   ABDOMINAL HYSTERECTOMY  2010   supracervical    AXILLARY LYMPH NODE DISSECTION Left 07/12/2018   Procedure: AXILLARY LYMPH NODE DISSECTION;  Surgeon: Robert Bellow, MD;  Location: [facility] ORS;  Service: General;  Laterality: Left;   BREAST BIOPSY Left 11/2017   invasive ductal carcinoma and metastatic LN   BREAST BIOPSY WITH SENTINEL LYMPH NODE BIOPSY AND NEEDLE LOCALIZATION Left 07/12/2018   Procedure: BREAST BIOPSY WIDE EXCISION WITH SENTINEL NODE  AND NEEDLE LOCALIZATION OF [name] LEFT;  Surgeon: Robert Bellow, MD;  Location: [facility] OR[facility];  Service: General;  Laterality: Left;   BUNIONECTOMY Bilateral    MANDIBLE RECONSTRUCTION     age 45-underbite   PORTACATH PLACEMENT Right 12/31/2017   Procedure: INSERTION PORT-A-CATH;  Surgeon: Robert Bellow, MD;  Location: [facility] OR[facility];  Service: General;  Laterality: Right;   RE-EXCISION OF BREAST LUMPECTOMY Left 07/24/2018   Procedure: RE-EXCISION OF BREAST LUMPECTOMY LEFT;  Surgeon: Robert Bellow, MD;  Location: [facility];  Service: General;  Laterality: Left;   SHOULDER ARTHROSCOPY WITH OPEN ROTATOR CUFF REPAIR Right 04/26/2017   Procedure: SHOULDER ARTHROSCOPY WITH OPEN ROTATOR CUFF REPAIR,SUBACROMINAL DECOMPRESSION;  Surgeon: Thornton Park, MD;  Location: [facility];  Service: Orthopedics;  Laterality: Right;   TONSILLECTOMY      FAMILY HISTORY: Family History  Problem Relation Age of Onset  Breast cancer Mother 75   Diabetes Father    Cancer Maternal Uncle        colon   Breast cancer Maternal Grandmother 49    ADVANCED DIRECTIVES (Y/N):  @ADVDIR @  HEALTH MAINTENANCE: Social History   Tobacco Use   Smoking status: Former Smoker    Packs/day: 0.50    Years: 10.00    Pack years: 5.00    Types: Cigarettes     Quit date: 04/20/2007    Years since quitting: 11.6   Smokeless tobacco: Never Used   Tobacco comment: social smoker back then  Substance Use Topics   Alcohol use: Yes    Comment: occas   Drug use: No     Colonoscopy:  PAP:  Bone density:  Lipid panel:  Allergies  Allergen Reactions   Hydrocodone Hives    Swollen face - many years ago. Thinks she has tolerated since.   Iodine Hives   Peanut Butter Flavor Other (See Comments)    Made mouth tingle   Shellfish Allergy Hives   Vancomycin Other (See Comments)    Itching and forehead turned red    Current Facility-Administered Medications  Medication Dose Route Frequency Provider Last Rate Last Dose   0.9 %  sodium chloride infusion   Intravenous PRN Swayze, Ava, DO   Stopped at 12/25/18 0153   ALPRAZolam (XANAX) tablet 0.25 mg  0.25 mg Oral BID PRN Para Skeans, MD   0.25 mg at 12/24/18 2123   cholecalciferol (VITAMIN D) tablet 2,000 Units  2,000 Units Oral QPM Para Skeans, MD   2,000 Units at 12/25/18 1721   dexamethasone (DECADRON) tablet 8 mg  8 mg Oral Q8H Lloyd Huger, MD   8 mg at 12/26/18 0549   diphenhydrAMINE (BENADRYL) capsule 25 mg  25 mg Oral Q4H PRN Swayze, Ava, DO   25 mg at 12/25/18 0242   feeding supplement (ENSURE ENLIVE) (ENSURE ENLIVE) liquid 237 mL  237 mL Oral BID BM Swayze, Ava, DO   237 mL at 12/25/18 1100   fluticasone (FLONASE) 50 MCG/ACT nasal spray 1 spray  1 spray Each Nare Daily Florina Ou V, MD       HYDROmorphone (DILAUDID) injection 0.5 mg  0.5 mg Intravenous Q3H PRN Florina Ou V, MD   0.5 mg at 12/26/18 0336   ibuprofen (ADVIL) tablet 400 mg  400 mg Oral Q6H PRN Lang Snow, NP   400 mg at 12/25/18 1129   losartan (COZAAR) tablet 50 mg  50 mg Oral Daily Florina Ou V, MD   50 mg at 12/25/18 1721   montelukast (SINGULAIR) tablet 10 mg  10 mg Oral QHS Para Skeans, MD       ondansetron [facility]) injection 4 mg  4 mg Intravenous Q6H PRN Lang Snow, NP   4 mg at 12/22/18 1601   oxyCODONE (Oxy IR/ROXICODONE) immediate release tablet 5 mg  5 mg Oral Q3H PRN Swayze, Ava, DO   5 mg at 12/25/18 0242   pantoprazole (PROTONIX) EC tablet 40 mg  40 mg Oral Daily Florina Ou V, MD   40 mg at 12/26/18 [id number]   polyethylene glycol (MIRALAX / GLYCOLAX) packet 17 g  17 g Oral Daily Swayze, Ava, DO   17 g at 12/25/18 0902   sucralfate (CARAFATE) tablet 1 g  1 g Oral TID WC & HS Para Skeans, MD   1 g at 12/26/18 1132    OBJECTIVE: Vitals:   12/26/18  0345 12/26/18 0512  BP: 136/90 (!) 136/92  Pulse: 71 61  Resp: 16 20  Temp:  (!) 97.5 F (36.4 C)  SpO2: 99% 98%     Body mass index is 23.08 kg/m.    ECOG FS:1 - Symptomatic but completely ambulatory  General: Well-developed, well-nourished, no acute distress. Eyes: Pink conjunctiva, anicteric sclera. HEENT: Normocephalic, moist mucous membranes. Lungs: Clear to auscultation bilaterally. Heart: Regular rate and rhythm. No rubs, murmurs, or gallops. Abdomen: Hepatomegaly. Musculoskeletal: No edema, cyanosis, or clubbing. Neuro: Alert, answering all questions appropriately. Cranial nerves grossly intact. Skin: No rashes or petechiae noted. Psych: Normal affect.   LAB RESULTS:  Lab Results  Component Value Date   NA 134 (L) 12/25/2018   K 3.4 (L) 12/25/2018   CL 96 (L) 12/25/2018   CO2 27 12/25/2018   GLUCOSE 110 (H) 12/25/2018   BUN 9 12/25/2018   CREATININE 0.46 12/25/2018   CALCIUM 8.2 (L) 12/25/2018   PROT 5.5 (L) 12/23/2018   ALBUMIN 2.6 (L) 12/23/2018   AST 199 (H) 12/23/2018   ALT 124 (H) 12/23/2018   ALKPHOS 101 12/23/2018   BILITOT 1.0 12/23/2018   GFRNONAA >60 12/25/2018   GFRAA >60 12/25/2018    Lab Results  Component Value Date   WBC 4.6 12/25/2018   NEUTROABS 3.0 12/23/2018   HGB 9.0 (L) 12/25/2018   HCT 26.5 (L) 12/25/2018   MCV 94.0 12/25/2018   PLT 49 (L) 12/25/2018     STUDIES: Ct Abdomen Pelvis Wo Contrast  Addendum Date: 12/18/2018     ADDENDUM REPORT: 12/18/2018 20:18 ADDENDUM: These results were called by telephone on 12/18/2018 at 8:18 pm to provider [facility] , who verbally acknowledged these results. Electronically Signed   By: Lovena Le M.D.   On: 12/18/2018 20:18   Result Date: 12/18/2018 CLINICAL DATA:  Abdominal pain and distension, undergoing chemotherapy for breast cancer, history of IBS and diverticulitis EXAM: CT ABDOMEN AND PELVIS WITHOUT CONTRAST TECHNIQUE: Multidetector CT imaging of the abdomen and pelvis was performed following the standard protocol without IV contrast. COMPARISON:  CT abdomen pelvis 01/01/2018 FINDINGS: Lower chest: The patchy areas of subpleural ground-glass opacity could reflect atelectasis or early infection. Cardiac size is normal. Trace pericardial effusion, similar to prior. Hepatobiliary: Ill-defined regions of hypoattenuation are seen the anterior left lobe liver. Slightly nodular hepatic surface contour which is new from comparison exam. These features are both incompletely assessed in the absence of contrast media. Gallbladder appears largely contracted at the time of exam. No biliary ductal dilatation. Pancreas: Unremarkable. No pancreatic ductal dilatation or surrounding inflammatory changes. Spleen: Normal in size without focal abnormality. Adrenals/Urinary Tract: No suspicious adrenal lesions. No visible or contour deforming renal lesions. No urolithiasis or hydronephrosis mild circumferential bladder wall thickening. Stomach/Bowel: Distal esophagus, stomach and duodenal sweep are unremarkable. No small bowel wall thickening or dilatation. No evidence of obstruction. Appendix is not clearly identified. No pericecal inflammation. There is some mild mural thickening of the splenic flexure and descending colon remaining portions of the colon are free of mural thickening or dilatation. Vascular/Lymphatic: Upper abdominal venous collateralization and splenorenal shunting is noted. No suspicious  or enlarged lymph nodes in the included lymphatic chains. Reproductive: Uterus is surgically absent. No concerning adnexal lesions. Other: Small volume of low-attenuation ascites noted in the low abdomen. Small volume of perihepatic low-attenuation ascites is present as well. Musculoskeletal: Multilevel degenerative changes are present in the imaged portions of the spine. No acute osseous abnormality or suspicious osseous lesion. IMPRESSION:  1. Interval development of cirrhotic stigmata including a nodular liver surface contour and increasing upper abdominal venous collateralization. Small volume of ascites is noted as well. 2. Ill-defined regions of hypoattenuation are seen within the anterior left lobe liver, which are incompletely assessed in the absence of contrast media. Furthermore findings are worrisome in the setting of known malignancy and potential intrinsic liver disease. Consider further evaluation with MRI or ultrasound if patient is unable to tolerate CT contrast due to allergy. 3. Mild mural thickening of the splenic flexure and descending colon, could reflect change related to the additional cirrhotic findings/portal colopathy though should exclude a an acute colitis clinically. 4. Patchy areas of subpleural ground-glass opacity in the visualized lung bases could reflect atelectasis or early infection. 5. Mild circumferential bladder wall thickening, which may be related to underdistention. Correlate with urinalysis to exclude cystitis. 6. Unchanged trace pericardial effusion. Electronically Signed: By: Lovena Le M.D. On: 12/18/2018 20:07   Dg Chest 1 View  Result Date: 12/23/2018 CLINICAL DATA:  Cirrhosis, ascites, SIRS, history LEFT breast cancer, hypertension EXAM: CHEST  1 VIEW COMPARISON:  Portable exam 1016 hours compared to 12/21/2018 FINDINGS: RIGHT subclavian Port-A-Cath with tip projecting over superior RIGHT atrium, unchanged. Normal heart size, mediastinal contours, and pulmonary  vascularity. Bibasilar atelectasis. Remaining lungs clear. No pleural effusion or pneumothorax. IMPRESSION: Bibasilar atelectasis. Electronically Signed   By: Lavonia Dana M.D.   On: 12/23/2018 10:58   Ct Angio Chest Pe W And/or Wo Contrast  Result Date: 12/21/2018 CLINICAL DATA:  48 y.o. female discharged yesterday for concerns of abdominal pain with a work-up regarding her liver as well as she is meeting SIRS criteria with feverPatient comes back today reports she continues to have significant pain and discomfort. She has been having ongoing and severe pain located in the mid to right upper abdomen, also she reports fairly significant pain when she takes a deep breath over the right lower lungShe has been feeling warm had a fever when she was in the hospital yesterday. Some nausea. No headaches. Tested negative for Covid a few days ago pain is located primarily in the right upper abdomen under the right rib cage. EXAM: CT ANGIOGRAPHY CHEST CT ABDOMEN AND PELVIS WITH CONTRAST TECHNIQUE: Multidetector CT imaging of the chest was performed using the standard protocol during bolus administration of intravenous contrast. Multiplanar CT image reconstructions and MIPs were obtained to evaluate the vascular anatomy. Multidetector CT imaging of the abdomen and pelvis was performed using the standard protocol during bolus administration of intravenous contrast. CONTRAST:  36mL OMNIPAQUE IOHEXOL 350 MG/ML SOLN COMPARISON:  Chest CTA and abdomen and pelvis CT dated 12/19/2018. FINDINGS: CTA CHEST FINDINGS Cardiovascular: There is satisfactory opacification of the pulmonary arteries to the segmental level. No evidence of a pulmonary embolism. Mediastinum/Nodes: No enlarged mediastinal, hilar, or axillary lymph nodes. Thyroid gland, trachea, and esophagus demonstrate no significant findings. Lungs/Pleura: Small bilateral pleural effusions. There is dependent opacity in the lower lobes consistent with atelectasis. Small  areas of ground-glass opacity and small lung nodules noted. Dominant nodule in the right upper lobe, image 46, series 4, 6 mm. Several other small nodules are new from the prior CT. Area of ground-glass opacity adjacent to the oblique fissure in the left upper lobe is new. No pneumothorax. Musculoskeletal: No chest wall abnormality. No acute or significant osseous findings. Review of the MIP images confirms the above findings. CT ABDOMEN and PELVIS FINDINGS Hepatobiliary: Enlarged liver. There are multiple hypoattenuating areas throughout the liver. These  areas are more apparent and more extensive than on the recent prior CT, particularly evident at the dome of segment 4A, along the anterior aspect of the right lobe and medial segment of the left lobe and along the inferior aspect of the right lobe, segment 5 and 6. Gallbladder is mostly collapsed. There is increased attenuation material within the gallbladder similar to the prior CT. No bile duct dilation Pancreas: Unremarkable. No pancreatic ductal dilatation or surrounding inflammatory changes. Spleen: Normal in size without focal abnormality. Adrenals/Urinary Tract: No adrenal masses. Right kidney displaced inferiorly by the enlarged liver. Symmetric renal enhancement and excretion. No renal masses, stones or hydronephrosis. Normal ureters. Bladder wall is prominent, unchanged from the recent prior study. No bladder masses. Stomach/Bowel: Stomach is unremarkable, mostly decompressed. Small bowel and colon are normal in caliber. No wall thickening or inflammation. Appendix not discretely seen. No findings of appendicitis. Vascular/Lymphatic: As noted on the prior CT, left portal vein is attenuated, but does show enhancement. No evidence of thrombosis. There are venous collaterals in the upper abdomen. Aorta is unremarkable. No enlarged lymph nodes. Prominent Peri celiac node noted on the prior study is stable. Reproductive: Status post hysterectomy. No adnexal  masses. Other: Small amount of ascites increased when compared to the prior CT. Musculoskeletal: No acute or significant osseous findings. Review of the MIP images confirms the above findings. IMPRESSION: CHEST CTA 1. No evidence of a pulmonary embolism. 2. Small pleural effusions, new from the prior CT. There is increased opacity in the lower lobes that is consistent with atelectasis. There several new small nodules and small areas of ground-glass opacity when compared to the prior CT. Suspect these are inflammatory given change from the recent exam. ABDOMEN AND PELVIS CT 1. Multiple hypoattenuating liver lesions. These appear to have progressed when compared to the prior CT, although this change could be due to differences in contrast timing. The liver is enlarged, stable from the recent exam. Findings are concerning for multifocal hepatocellular carcinoma. Follow-up liver MRI without and with contrast, when the patient can tolerate the procedure, is recommended. 2. Small amount ascites has increased in quantity from the prior study. 3. There are vascular collaterals in the upper abdomen, stable. Electronically Signed   By: Lajean Manes M.D.   On: 12/21/2018 18:36   Ct Angio Chest Pe W And/or Wo Contrast  Result Date: 12/19/2018 CLINICAL DATA:  Chest pain, complex, intermediate/high prob of ACS/PE/AAS; Abd distension Epigastric pain. Exertional dyspnea. Cough. Weakness. History of breast cancer. EXAM: CT ANGIOGRAPHY CHEST CT ABDOMEN AND PELVIS WITH CONTRAST TECHNIQUE: Multidetector CT imaging of the chest was performed using the standard protocol during bolus administration of intravenous contrast. Multiplanar CT image reconstructions and MIPs were obtained to evaluate the vascular anatomy. Multidetector CT imaging of the abdomen and pelvis was performed using the standard protocol during bolus administration of intravenous contrast. No reported contrast reaction. CONTRAST:  153mL OMNIPAQUE IOHEXOL 350 MG/ML  SOLN COMPARISON:  Abdominal CT without contrast yesterday. Chest CT 01/01/2018 FINDINGS: CTA CHEST FINDINGS Cardiovascular: There are no filling defects within the pulmonary arteries to suggest pulmonary embolus. Thoracic aorta is normal in caliber without dissection. Left vertebral artery arises directly from the thoracic aorta, variant arch anatomy. Heart is normal in size. No pericardial effusion. Right chest port in place with tip in the SVC. Mediastinum/Nodes: No mediastinal or hilar adenopathy. No visualized thyroid nodule. No esophageal wall thickening. Lungs/Pleura: Mild patchy areas of subpleural nodular and ground-glass opacities. Basilar findings are unchanged from CT  yesterday. 5 mm right upper lobe pulmonary nodule series 6, image 41. Sub solid nodularity slightly more inferiorly in the right lung series 6, image 45 measuring 8 mm. Tiny 2 mm nodule in the right upper lung series 6, image 32, may be fissural. Pulmonary nodules are new from prior chest CT. Perifissural in subpleural opacity in the right upper lobe, also series 6, image 32, nonspecific. No pulmonary edema. No pleural fluid. Trachea and mainstem bronchi are patent. Musculoskeletal: There are no acute or suspicious osseous abnormalities. Review of the MIP images confirms the above findings. CT ABDOMEN and PELVIS FINDINGS Hepatobiliary: Hepatic parenchyma diffusely heterogeneous with nodular contours. Liver is increased in size from December 2019 CT. Gallbladder is decompressed and not well assessed, intraluminal high density may be stones or sludge. Questionable wall thickening may be related to nondistention. Pancreas: No ductal dilatation or inflammation. Spleen: Normal in size without focal abnormality. Adrenals/Urinary Tract: Normal adrenal glands. No hydronephrosis or perinephric edema. Homogeneous renal enhancement with symmetric excretion on delayed phase imaging. Urinary bladder is partially distended, mild bladder wall thickening.  Stomach/Bowel: Lack of enteric contrast and paucity of intra-abdominal fat limits detailed bowel assessment. The stomach is nondistended. Questionable pre-pyloric gastric wall thickening. No small bowel obstruction or inflammatory change. Appendix not discretely visualized, no pericecal inflammation to suggest appendicitis. Transverse colon is tortuous. Mild colonic wall thickening at the splenic flexure, colon is decompressed, this is not well assessed. Questionable areas of descending colonic wall thickening, for example series 11, image 61. Vascular/Lymphatic: Right portal vein is patent. The left portal vein is attenuated without evidence of thrombus. Tortuous splenic vein with prominent collaterals in the left upper quadrant and left retroperitoneum. 9 mm perigastric lymph node series 11, image 29, nonspecific. No pelvic adenopathy. Reproductive: Uterus surgically absent. Left ovary tentatively visualized and normal. Right ovary not definitively seen. No obvious adnexal mass. Other: Small volume of pelvic ascites measuring simple fluid density, similar to yesterday. Small perihepatic ascites anteriorly. No free air. Musculoskeletal: There are no acute or suspicious osseous abnormalities. Scattered bone islands in the pelvis. Review of the MIP images confirms the above findings. IMPRESSION: CT chest: 1. No pulmonary embolus. 2. Mild patchy areas of subpleural nodular and ground-glass opacities in both lungs. Slightly more confluent subpleural/perifissural right upper lobe opacity. Findings may be due to atelectasis or infection. 3. Small pulmonary nodules are new from December, infectious/inflammatory or metastatic. CT abdomen/pelvis: 1. Diffusely heterogeneous liver with nodular contours. Liver has increased in size since December 2019 CT. This may be due to underlying cirrhotic change versus diffuse metastatic disease. Recommend further evaluation with abdominal MRI. Left upper quadrant and retroperitoneal  collaterals can be seen with portal hypertension. Left portal vein is attenuated, however no discrete thrombus. 2. Questionable pre-pyloric gastric wall thickening, peptic ulcer disease versus gastritis. 3. Bladder wall thickening, small volume abdominopelvic ascites, and areas of colonic wall thickening at the splenic flexure and descending colon are unchanged from CT yesterday. 4. Gallbladder is decompressed and not well assessed, intraluminal high density may be stones or sludge. Questionable gallbladder wall thickening may be related to nondistention or liver disease. This could be further evaluated with MRI or right upper quadrant ultrasound. Electronically Signed   By: Keith Rake M.D.   On: 12/19/2018 05:33   Mr Abdomen W Wo Contrast  Result Date: 12/19/2018 CLINICAL DATA:  Abdominal pain in a patient with breast cancer. EXAM: MRI ABDOMEN WITHOUT AND WITH CONTRAST TECHNIQUE: Multiplanar multisequence MR imaging of the abdomen was performed  both before and after the administration of intravenous contrast. CONTRAST:  35mL GADAVIST GADOBUTROL 1 MMOL/ML IV SOLN COMPARISON:  CT of 12/19/2018 FINDINGS: Lower chest: Limited assessment of the lower chest on MRI is unremarkable. Hepatobiliary: Diffuse infiltration of much of the liver, nearly the entire left hepatic lobe, medial section and lateral section in addition to anterior right hepatic lobe with confluent infiltrative restricted diffusion and heterogeneous enhancement measuring approximately 21 by 7.4 cm. Diffuse metastatic disease is strongly suspected. Boundaries are well-demarcated without adjacent edema in the bordering hepatic parenchyma Discrete hepatic lesions are also scattered about the right hepatic lobe largest in the dome of the right hemi liver measuring approximately 2.5 cm in greatest dimension. These discrete lesions display a targetoid appearance. There are numerous lesions in the right hepatic lobe, lesions are found in all hepatic  subsegment. Pancreas: No mass, inflammatory changes, or other parenchymal abnormality identified. Mild ductal distension in the tail of the pancreas may be due to adjacent mass effect. No visible pancreatic lesion is noted. Spleen:  Within normal limits in size and appearance. Adrenals/Urinary Tract: No masses identified. No evidence of hydronephrosis. Stomach/Bowel: Limited assessment of the gastrointestinal tract without signs of acute process. Vascular/Lymphatic: Left-sided peri-renal venous collaterals of uncertain significance perhaps related to elevated portal pressures in the setting of diffuse hepatic involvement. 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 Right portal vein is patent. Scattered small lymph nodes in the upper abdomen. There is either slow flow or thrombus within the main portal vein at the level of the splenic portal confluence extending in the splenic vein. The SMV is patent. There is mass effect upon the vessel at the level of the splenic portal confluence due to hepatic enlargement. This is best seen on subtraction images, image 54, series 20. Note that slow flow in an otherwise opacified vessel can cause artifact with a similar appearance however, this is seen on contrasted and non contrasted imaging with a similar appearance. Other:  None. Musculoskeletal: No signs of acute or destructive bone process. IMPRESSION: 1. Diffuse infiltration of much of the liver, nearly the entire left hepatic lobe, with confluent infiltrative restricted diffusion and heterogeneous enhancement measuring approximately 21 by 7.4 cm. Diffuse metastatic disease is strongly suspected with multifocal lesions in the right hepatic lobe. Superimposed infection is difficult to exclude though areas at the boundary of these lesions show no overt signs of edema. 2. Highly narrowed or occluded left portal vein with potential thrombus versus slow flow in the splenoportal confluence. This was patent on the exam of 12/19/2018;  however, given above findings a venous phase CT or even ultrasound hepatic Doppler with special attention to the splenic portal confluence may be helpful to exclude this possibility, finding is also exhibited on TrueFISP sequence (image 24, series 10). 3. 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 4. Mild ductal distension in the tail of the pancreas, potentially due to mass effect. Attention on follow-up. 5. Critical Value/emergent results were called by telephone at the time of interpretation on 12/19/2018 at 6:43 pm to [place] , who verbally acknowledged these results. Electronically Signed   By: Zetta Bills M.D.   On: 12/19/2018 18:24   US Abdomen Complete  Result Date: 12/19/2018 CLINICAL DATA:  Abdominal pain EXAM: ABDOMEN ULTRASOUND COMPLETE COMPARISON:  MRI earlier today FINDINGS: Gallbladder: Gallbladder is contracted with associated mild wall thickening measuring up to 4 mm. Sludge noted within the gallbladder. No  visible stones. Common bile duct: Diameter: Normal caliber, 3 mm Liver: Diffusely heterogeneous echotexture throughout the liver corresponding to the abnormality seen on today's MRI suspicious forinfiltrating mass. Main portal vein is patent on color Doppler imaging with normal direction of blood flow towards the liver. The area of possible thrombus seen on MRI at the level of the splenic portal confluence not visualized. IVC: No abnormality visualized. Pancreas: Visualized portion unremarkable. Spleen: Size and appearance within normal limits. Right Kidney: Length: 11.0 cm. Echogenicity within normal limits. No mass or hydronephrosis visualized. Left Kidney: Length: 10.5 cm. Echogenicity within normal limits. No mass or hydronephrosis visualized. Abdominal aorta: No aneurysm visualized. Other findings: None. IMPRESSION: Heterogeneous appearance throughout much of the left hepatic  lobe as seen on today's MRI concerning for infiltrating mass/tumor. Main portal vein is patent. Contracted gallbladder with gallbladder wall thickening likely related to contracted state. Sludge within the gallbladder. No sonographic Murphy sign or visible stones. Electronically Signed   By: Rolm Baptise M.D.   On: 12/19/2018 20:35   Ct Abdomen Pelvis W Contrast  Result Date: 12/21/2018 CLINICAL DATA:  48 y.o. female discharged yesterday for concerns of abdominal pain with a work-up regarding her liver as well as she is meeting SIRS criteria with feverPatient comes back today reports she continues to have significant pain and discomfort. She has been having ongoing and severe pain located in the mid to right upper abdomen, also she reports fairly significant pain when she takes a deep breath over the right lower lungShe has been feeling warm had a fever when she was in the hospital yesterday. Some nausea. No headaches. Tested negative for Covid a few days ago pain is located primarily in the right upper abdomen under the right rib cage. EXAM: CT ANGIOGRAPHY CHEST CT ABDOMEN AND PELVIS WITH CONTRAST TECHNIQUE: Multidetector CT imaging of the chest was performed using the standard protocol during bolus administration of intravenous contrast. Multiplanar CT image reconstructions and MIPs were obtained to evaluate the vascular anatomy. Multidetector CT imaging of the abdomen and pelvis was performed using the standard protocol during bolus administration of intravenous contrast. CONTRAST:  25mL OMNIPAQUE IOHEXOL 350 MG/ML SOLN COMPARISON:  Chest CTA and abdomen and pelvis CT dated 12/19/2018. FINDINGS: CTA CHEST FINDINGS Cardiovascular: There is satisfactory opacification of the pulmonary arteries to the segmental level. No evidence of a pulmonary embolism. Mediastinum/Nodes: No enlarged mediastinal, hilar, or axillary lymph nodes. Thyroid gland, trachea, and esophagus demonstrate no significant findings.  Lungs/Pleura: Small bilateral pleural effusions. There is dependent opacity in the lower lobes consistent with atelectasis. Small areas of ground-glass opacity and small lung nodules noted. Dominant nodule in the right upper lobe, image 46, series 4, 6 mm. Several other small nodules are new from the prior CT. Area of ground-glass opacity adjacent to the oblique fissure in the left upper lobe is new. No pneumothorax. Musculoskeletal: No chest wall abnormality. No acute or significant osseous findings. Review of the MIP images confirms the above findings. CT ABDOMEN and PELVIS FINDINGS Hepatobiliary: Enlarged liver. There are multiple hypoattenuating areas throughout the liver. These areas are more apparent and more extensive than on the recent prior CT, particularly evident at the dome of segment 4A, along the anterior aspect of the right lobe and medial segment of the left lobe and along the inferior aspect of the right lobe, segment 5 and 6. Gallbladder is mostly collapsed. There is increased attenuation material within the gallbladder similar to the prior CT. No bile duct dilation Pancreas: Unremarkable.  No pancreatic ductal dilatation or surrounding inflammatory changes. Spleen: Normal in size without focal abnormality. Adrenals/Urinary Tract: No adrenal masses. Right kidney displaced inferiorly by the enlarged liver. Symmetric renal enhancement and excretion. No renal masses, stones or hydronephrosis. Normal ureters. Bladder wall is prominent, unchanged from the recent prior study. No bladder masses. Stomach/Bowel: Stomach is unremarkable, mostly decompressed. Small bowel and colon are normal in caliber. No wall thickening or inflammation. Appendix not discretely seen. No findings of appendicitis. Vascular/Lymphatic: As noted on the prior CT, left portal vein is attenuated, but does show enhancement. No evidence of thrombosis. There are venous collaterals in the upper abdomen. Aorta is unremarkable. No enlarged  lymph nodes. Prominent Peri celiac node noted on the prior study is stable. Reproductive: Status post hysterectomy. No adnexal masses. Other: Small amount of ascites increased when compared to the prior CT. Musculoskeletal: No acute or significant osseous findings. Review of the MIP images confirms the above findings. IMPRESSION: CHEST CTA 1. No evidence of a pulmonary embolism. 2. Small pleural effusions, new from the prior CT. There is increased opacity in the lower lobes that is consistent with atelectasis. There several new small nodules and small areas of ground-glass opacity when compared to the prior CT. Suspect these are inflammatory given change from the recent exam. ABDOMEN AND PELVIS CT 1. Multiple hypoattenuating liver lesions. These appear to have progressed when compared to the prior CT, although this change could be due to differences in contrast timing. The liver is enlarged, stable from the recent exam. Findings are concerning for multifocal hepatocellular carcinoma. Follow-up liver MRI without and with contrast, when the patient can tolerate the procedure, is recommended. 2. Small amount ascites has increased in quantity from the prior study. 3. There are vascular collaterals in the upper abdomen, stable. Electronically Signed   By: Lajean Manes M.D.   On: 12/21/2018 18:36   Ct Abdomen Pelvis W Contrast  Result Date: 12/19/2018 CLINICAL DATA:  Chest pain, complex, intermediate/high prob of ACS/PE/AAS; Abd distension Epigastric pain. Exertional dyspnea. Cough. Weakness. History of breast cancer. EXAM: CT ANGIOGRAPHY CHEST CT ABDOMEN AND PELVIS WITH CONTRAST TECHNIQUE: Multidetector CT imaging of the chest was performed using the standard protocol during bolus administration of intravenous contrast. Multiplanar CT image reconstructions and MIPs were obtained to evaluate the vascular anatomy. Multidetector CT imaging of the abdomen and pelvis was performed using the standard protocol during  bolus administration of intravenous contrast. No reported contrast reaction. CONTRAST:  176mL OMNIPAQUE IOHEXOL 350 MG/ML SOLN COMPARISON:  Abdominal CT without contrast yesterday. Chest CT 01/01/2018 FINDINGS: CTA CHEST FINDINGS Cardiovascular: There are no filling defects within the pulmonary arteries to suggest pulmonary embolus. Thoracic aorta is normal in caliber without dissection. Left vertebral artery arises directly from the thoracic aorta, variant arch anatomy. Heart is normal in size. No pericardial effusion. Right chest port in place with tip in the SVC. Mediastinum/Nodes: No mediastinal or hilar adenopathy. No visualized thyroid nodule. No esophageal wall thickening. Lungs/Pleura: Mild patchy areas of subpleural nodular and ground-glass opacities. Basilar findings are unchanged from CT yesterday. 5 mm right upper lobe pulmonary nodule series 6, image 41. Sub solid nodularity slightly more inferiorly in the right lung series 6, image 45 measuring 8 mm. Tiny 2 mm nodule in the right upper lung series 6, image 32, may be fissural. Pulmonary nodules are new from prior chest CT. Perifissural in subpleural opacity in the right upper lobe, also series 6, image 32, nonspecific. No pulmonary edema. No pleural fluid. Trachea  and mainstem bronchi are patent. Musculoskeletal: There are no acute or suspicious osseous abnormalities. Review of the MIP images confirms the above findings. CT ABDOMEN and PELVIS FINDINGS Hepatobiliary: Hepatic parenchyma diffusely heterogeneous with nodular contours. Liver is increased in size from December 2019 CT. Gallbladder is decompressed and not well assessed, intraluminal high density may be stones or sludge. Questionable wall thickening may be related to nondistention. Pancreas: No ductal dilatation or inflammation. Spleen: Normal in size without focal abnormality. Adrenals/Urinary Tract: Normal adrenal glands. No hydronephrosis or perinephric edema. Homogeneous renal enhancement  with symmetric excretion on delayed phase imaging. Urinary bladder is partially distended, mild bladder wall thickening. Stomach/Bowel: Lack of enteric contrast and paucity of intra-abdominal fat limits detailed bowel assessment. The stomach is nondistended. Questionable pre-pyloric gastric wall thickening. No small bowel obstruction or inflammatory change. Appendix not discretely visualized, no pericecal inflammation to suggest appendicitis. Transverse colon is tortuous. Mild colonic wall thickening at the splenic flexure, colon is decompressed, this is not well assessed. Questionable areas of descending colonic wall thickening, for example series 11, image 61. Vascular/Lymphatic: Right portal vein is patent. The left portal vein is attenuated without evidence of thrombus. Tortuous splenic vein with prominent collaterals in the left upper quadrant and left retroperitoneum. 9 mm perigastric lymph node series 11, image 29, nonspecific. No pelvic adenopathy. Reproductive: Uterus surgically absent. Left ovary tentatively visualized and normal. Right ovary not definitively seen. No obvious adnexal mass. Other: Small volume of pelvic ascites measuring simple fluid density, similar to yesterday. Small perihepatic ascites anteriorly. No free air. Musculoskeletal: There are no acute or suspicious osseous abnormalities. Scattered bone islands in the pelvis. Review of the MIP images confirms the above findings. IMPRESSION: CT chest: 1. No pulmonary embolus. 2. Mild patchy areas of subpleural nodular and ground-glass opacities in both lungs. Slightly more confluent subpleural/perifissural right upper lobe opacity. Findings may be due to atelectasis or infection. 3. Small pulmonary nodules are new from December, infectious/inflammatory or metastatic. CT abdomen/pelvis: 1. Diffusely heterogeneous liver with nodular contours. Liver has increased in size since December 2019 CT. This may be due to underlying cirrhotic change versus  diffuse metastatic disease. Recommend further evaluation with abdominal MRI. Left upper quadrant and retroperitoneal collaterals can be seen with portal hypertension. Left portal vein is attenuated, however no discrete thrombus. 2. Questionable pre-pyloric gastric wall thickening, peptic ulcer disease versus gastritis. 3. Bladder wall thickening, small volume abdominopelvic ascites, and areas of colonic wall thickening at the splenic flexure and descending colon are unchanged from CT yesterday. 4. Gallbladder is decompressed and not well assessed, intraluminal high density may be stones or sludge. Questionable gallbladder wall thickening may be related to nondistention or liver disease. This could be further evaluated with MRI or right upper quadrant ultrasound. Electronically Signed   By: Keith Rake M.D.   On: 12/19/2018 05:33   US Biopsy (liver)  Result Date: 12/20/2018 INDICATION: History of left breast carcinoma with imaging evidence of probable diffuse metastatic disease in the liver, more prominently in the left lobe. The patient presents for biopsy. EXAM: ULTRASOUND GUIDED CORE BIOPSY OF LIVER MEDICATIONS: None. ANESTHESIA/SEDATION: Fentanyl 100 mcg IV; Versed 2.0 mg IV Moderate Sedation Time:  20 minutes. The patient was continuously monitored during the procedure by the interventional radiology nurse under my direct supervision. PROCEDURE: The procedure, risks, benefits, and alternatives were explained to the patient. Questions regarding the procedure were encouraged and answered. The patient understands and consents to the procedure. A time-out was performed prior to initiating the  procedure. Ultrasound was used to localize lesions within the liver. The anterior abdominal wall was prepped with chlorhexidine in a sterile fashion, and a sterile drape was applied covering the operative field. A sterile gown and sterile gloves were used for the procedure. Local anesthesia was provided with 1%  Lidocaine. Under direct ultrasound guidance, a 17 gauge trocar needle was advanced into the left lobe of the liver. After confirming needle tip position, coaxial 18 gauge core biopsy samples were obtained. A total of 3 samples were obtained and submitted in formalin. A slurry of Gel-Foam pledgets was then slowly injected via the outer needle as the needle was retracted. Additional ultrasound was performed. COMPLICATIONS: None immediate. FINDINGS: Ultrasound demonstrates heterogeneous appearance of the liver with ill-defined lesions throughout the left lobe. Solid core biopsy samples were obtained by sampling lesions within the lateral segment of the left lobe. IMPRESSION: Ultrasound-guided core biopsy performed of hepatic lesions within the left lobe. Electronically Signed   By: Aletta Edouard M.D.   On: 12/20/2018 13:44   Dg Chest Portable 1 View  Result Date: 12/21/2018 CLINICAL DATA:  Pleuritic right chest pain EXAM: PORTABLE CHEST 1 VIEW COMPARISON:  12/18/2018 chest radiograph. FINDINGS: Stable right subclavian Port-A-Cath terminating over the right atrium. Stable cardiomediastinal silhouette with normal heart size. No pneumothorax. No pleural effusion. No pulmonary edema. Curvilinear left lung base opacities. No acute consolidative airspace disease. IMPRESSION: Curvilinear left lung base opacities, favor scarring or atelectasis. No acute consolidative airspace disease. Electronically Signed   By: Ilona Sorrel M.D.   On: 12/21/2018 13:06   Dg Chest Portable 1 View  Result Date: 12/18/2018 CLINICAL DATA:  Fever abdominal pain EXAM: PORTABLE CHEST 1 VIEW COMPARISON:  12/31/2017 FINDINGS: Right-sided central venous port with tip over the cavoatrial region. No focal airspace disease or effusion. Normal heart size. No pneumothorax. IMPRESSION: No active disease. Electronically Signed   By: Donavan Foil M.D.   On: 12/18/2018 19:49    ASSESSMENT: Stage IV triple negative breast cancer with liver  metastasis.  PLAN:    1. Triple negative breast cancer: Metastasis confirmed by liver biopsy last week.  Patient has an appointment in the [facility] on January 01, 2019 to initiate palliative chemotherapy with [place]. 2.  Fevers: Appears to have resolved.  Appreciate ID input.  Possibly could be tumor fever.  Continue dexamethasone 8 mg 3 times a day upon discharge. 3.  Thrombocytopenia: Mildly improved, monitor. 4.  Elevated liver enzymes: Slightly improved.  Secondary to infiltrative malignancy. 5.  Anemia: Hemoglobin has trended down slightly to 9.0, continue to monitor. 6.  Pain: Patient states Dilaudid works well and does not cause her to have pruritic skin.  Continue upon discharge. 7.  Shortness of breath: Patient now has an oxygen requirement, possibly secondary to atelectasis.  She likely will need oxygen upon discharge. 8.  Disposition: Okay to discharge from an oncology standpoint.  Follow-up as above.     Lloyd Huger, MD   12/26/2018 11:33 AM

## 2018-12-27 DIAGNOSIS — R1011 Right upper quadrant pain: Secondary | ICD-10-CM | POA: Diagnosis not present

## 2018-12-27 DIAGNOSIS — R160 Hepatomegaly, not elsewhere classified: Secondary | ICD-10-CM | POA: Diagnosis not present

## 2018-12-27 DIAGNOSIS — Z515 Encounter for palliative care: Secondary | ICD-10-CM | POA: Diagnosis not present

## 2018-12-27 DIAGNOSIS — I10 Essential (primary) hypertension: Secondary | ICD-10-CM | POA: Diagnosis not present

## 2018-12-27 MED ORDER — DEXAMETHASONE 4 MG PO TABS: 8.0000 mg | ORAL_TABLET | Freq: Three times a day (TID) | ORAL | 1 refills | Status: AC

## 2018-12-27 MED ORDER — HYDROMORPHONE HCL 2 MG PO TABS: 1.0000 mg | ORAL_TABLET | ORAL | 0 refills | Status: AC | PRN

## 2018-12-27 MED ORDER — DOCUSATE SODIUM 100 MG PO CAPS: 100.0000 mg | ORAL_CAPSULE | Freq: Two times a day (BID) | ORAL | 11 refills | Status: DC

## 2018-12-27 MED ORDER — DULCOLAX 5 MG PO TBEC: 5.0000 mg | DELAYED_RELEASE_TABLET | Freq: Every day | ORAL | 1 refills | Status: DC | PRN

## 2018-12-27 NOTE — Care Management (Signed)
Patient was discharged home today with home O2  Patient with qualifying sats, and dx.   Referral made to [facility] with [place].  Portable O2 delivered to room prior to discharge.   Patient lives at home with mother and son.  Independent of ADLs   To start palliative chemo next week

## 2018-12-27 NOTE — Discharge Summary (Signed)
Physician Discharge Summary  Shelby [name] [id number] DOB: 07-03-1970 DOA: 12/21/2018  PCP: Jerrol Banana., MD  Admit date: 12/21/2018 Discharge date: 12/27/2018  Admitted From: Inpatient Disposition: home  Recommendations for Outpatient Follow-up:  1. Follow up with PCP in 1-2 weeks 2. Follow-up with oncology as scheduled  Home Health:No Equipment/Devices: Oxygen Discharge Condition:Stable CODE STATUS:Full code Diet recommendation: Regular healthy diet  Brief/Interim Summary: The patient is a 48 yr old woman who was discharged from this facility on 12/20/2018 after a stay for abdominal pain, liver mass, and cirrhosis of the liver with ascites, and SIRS. She underwent biopsy of her liver mass and was discharged.   On 12/21/2018 the patient returned to the ED with complaints of abdominal pain and subjective fevers as well as shortness of breath. She was found to have atelectasis and inflammatory changes in her right lower lobe. She has had some changes consistent with SIRS such as elevated lactic acid.   Biopsy results confirm metastasis of breast cancer. Dr. Grayland Ormond discussed this result with the patient.Fevers possibly due to occult infection or cancer. She has been started on Zosyn by infectious disease  Hospital course: Hypoxic respiratory failure with atelectasis secondary to mass-effect from liver tumor as previously noted CTA of chest showed liver tumor with hepatomegaly and capsule expansion.  Patient was placed on incentive spirometry pain medications to facilitate breathing.  There was no indication of infection.  Patient is remained afebrile.  Her fever lactic acidosis tachycardia and elevated procalcitonin seem to be inflammatory response to cancer versus true infection.  She was started on Zosyn and followed by infectious disease but it was stopped November 25.  Patient was placed on Decadron which she will continue with 8 mg every 8 hours with close  follow-up with oncology.  Patient did qualify for oxygen which will be sent home also with a portable unit.  Patient will be sent home with Dilaudid.  Does not tolerate oxycodone well, Dilaudid does not present any side effects and she is tolerating it with pain well controlled.  Finally patient will follow-up closely with her cancer doctor as an outpatient.  Discharge Diagnoses:  Principal Problem:   Abdominal pain, acute, right upper quadrant Active Problems:   Hypertension   Transaminitis   Liver mass   Palliative care encounter    Discharge Instructions  Discharge Instructions    Call MD for:  difficulty breathing, headache or visual disturbances   Complete by: As directed    Call MD for:  extreme fatigue   Complete by: As directed    Call MD for:  hives   Complete by: As directed    Call MD for:  persistant dizziness or light-headedness   Complete by: As directed    Call MD for:  persistant nausea and vomiting   Complete by: As directed    Call MD for:  severe uncontrolled pain   Complete by: As directed    Call MD for:  temperature >100.4   Complete by: As directed    Diet - low sodium heart healthy   Complete by: As directed    Increase activity slowly   Complete by: As directed      Allergies as of 12/27/2018      Reactions   Hydrocodone Hives   Swollen face - many years ago. Thinks she has tolerated since.   Iodine Hives   Peanut Butter Flavor Other (See Comments)   Made mouth tingle   Shellfish Allergy Hives  Vancomycin Other (See Comments)   Itching and forehead turned red      Medication List    TAKE these medications   ALPRAZolam 0.5 MG tablet Commonly known as: XANAX Take 0.5-1 tablets (0.25-0.5 mg total) by mouth 2 (two) times daily as needed for anxiety.   dexamethasone 4 MG tablet Commonly known as: DECADRON Take 2 tablets (8 mg total) by mouth every 8 (eight) hours for 28 days.   docusate sodium 100 MG capsule Commonly known as:  Colace Take 1 capsule (100 mg total) by mouth 2 (two) times daily.   Dulcolax 5 MG EC tablet Generic drug: bisacodyl Take 1 tablet (5 mg total) by mouth daily as needed for moderate constipation.   famotidine 20 MG tablet Commonly known as: PEPCID Take 20 mg by mouth daily as needed for heartburn.   HYDROmorphone 2 MG tablet Commonly known as: Dilaudid Take 0.5 tablets (1 mg total) by mouth every 4 (four) hours as needed for up to 14 days for severe pain (cancer associated pain).   ibuprofen 200 MG tablet Commonly known as: ADVIL Take 200 mg by mouth daily as needed for mild pain.   losartan 50 MG tablet Commonly known as: COZAAR TAKE 1 TABLET BY MOUTH DAILY   mometasone 50 MCG/ACT nasal spray Commonly known as: Nasonex Place 2 sprays into the nose daily. What changed:   when to take this  reasons to take this   montelukast 10 MG tablet Commonly known as: SINGULAIR TAKE 1 TABLET BY MOUTH AT BEDTIME What changed:   when to take this  reasons to take this   multivitamin with minerals Tabs tablet Take 1 tablet by mouth daily.   Omega-3 1000 MG Caps Take 1,000 mg by mouth every evening.   omeprazole 20 MG capsule Commonly known as: PRILOSEC Take 1 capsule (20 mg total) by mouth daily.   sucralfate 1 g tablet Commonly known as: CARAFATE Take 1 tablet (1 g total) by mouth 4 (four) times daily -  with meals and at bedtime.   traMADol 50 MG tablet Commonly known as: ULTRAM Take 1 tablet (50 mg total) by mouth every 12 (twelve) hours as needed for severe pain.   Vitamin D 50 MCG (2000 UT) Caps Take 2,000 Units by mouth every evening.            Durable Medical Equipment  (From admission, onward)         Start     Ordered   12/27/18 1125  DME Oxygen  Once    Question Answer Comment  Length of Need 6 Months   Mode or (Route) Nasal cannula   Frequency Continuous (stationary and portable oxygen unit needed)   Oxygen delivery system Gas      12/27/18  1127          Allergies  Allergen Reactions  . Hydrocodone Hives    Swollen face - many years ago. Thinks she has tolerated since.  . Iodine Hives  . Peanut Butter Flavor Other (See Comments)    Made mouth tingle  . Shellfish Allergy Hives  . Vancomycin Other (See Comments)    Itching and forehead turned red    Consultations:  Oncology, infectious disease   Procedures/Studies: Ct Abdomen Pelvis Wo Contrast  Addendum Date: 12/18/2018   ADDENDUM REPORT: 12/18/2018 20:18 ADDENDUM: These results were called by telephone on 12/18/2018 at 8:18 pm to provider [facility] , who verbally acknowledged these results. Electronically Signed   By: March Rummage  [facility] M.D.   On: 12/18/2018 20:18   Result Date: 12/18/2018 CLINICAL DATA:  Abdominal pain and distension, undergoing chemotherapy for breast cancer, history of IBS and diverticulitis EXAM: CT ABDOMEN AND PELVIS WITHOUT CONTRAST TECHNIQUE: Multidetector CT imaging of the abdomen and pelvis was performed following the standard protocol without IV contrast. COMPARISON:  CT abdomen pelvis 01/01/2018 FINDINGS: Lower chest: The patchy areas of subpleural ground-glass opacity could reflect atelectasis or early infection. Cardiac size is normal. Trace pericardial effusion, similar to prior. Hepatobiliary: Ill-defined regions of hypoattenuation are seen the anterior left lobe liver. Slightly nodular hepatic surface contour which is new from comparison exam. These features are both incompletely assessed in the absence of contrast media. Gallbladder appears largely contracted at the time of exam. No biliary ductal dilatation. Pancreas: Unremarkable. No pancreatic ductal dilatation or surrounding inflammatory changes. Spleen: Normal in size without focal abnormality. Adrenals/Urinary Tract: No suspicious adrenal lesions. No visible or contour deforming renal lesions. No urolithiasis or hydronephrosis mild circumferential bladder wall thickening. Stomach/Bowel:  Distal esophagus, stomach and duodenal sweep are unremarkable. No small bowel wall thickening or dilatation. No evidence of obstruction. Appendix is not clearly identified. No pericecal inflammation. There is some mild mural thickening of the splenic flexure and descending colon remaining portions of the colon are free of mural thickening or dilatation. Vascular/Lymphatic: Upper abdominal venous collateralization and splenorenal shunting is noted. No suspicious or enlarged lymph nodes in the included lymphatic chains. Reproductive: Uterus is surgically absent. No concerning adnexal lesions. Other: Small volume of low-attenuation ascites noted in the low abdomen. Small volume of perihepatic low-attenuation ascites is present as well. Musculoskeletal: Multilevel degenerative changes are present in the imaged portions of the spine. No acute osseous abnormality or suspicious osseous lesion. IMPRESSION: 1. Interval development of cirrhotic stigmata including a nodular liver surface contour and increasing upper abdominal venous collateralization. Small volume of ascites is noted as well. 2. Ill-defined regions of hypoattenuation are seen within the anterior left lobe liver, which are incompletely assessed in the absence of contrast media. Furthermore findings are worrisome in the setting of known malignancy and potential intrinsic liver disease. Consider further evaluation with MRI or ultrasound if patient is unable to tolerate CT contrast due to allergy. 3. Mild mural thickening of the splenic flexure and descending colon, could reflect change related to the additional cirrhotic findings/portal colopathy though should exclude a an acute colitis clinically. 4. Patchy areas of subpleural ground-glass opacity in the visualized lung bases could reflect atelectasis or early infection. 5. Mild circumferential bladder wall thickening, which may be related to underdistention. Correlate with urinalysis to exclude cystitis. 6.  Unchanged trace pericardial effusion. Electronically Signed: By: Lovena Le M.D. On: 12/18/2018 20:07   Dg Chest 1 View  Result Date: 12/23/2018 CLINICAL DATA:  Cirrhosis, ascites, SIRS, history LEFT breast cancer, hypertension EXAM: CHEST  1 VIEW COMPARISON:  Portable exam 1016 hours compared to 12/21/2018 FINDINGS: RIGHT subclavian Port-A-Cath with tip projecting over superior RIGHT atrium, unchanged. Normal heart size, mediastinal contours, and pulmonary vascularity. Bibasilar atelectasis. Remaining lungs clear. No pleural effusion or pneumothorax. IMPRESSION: Bibasilar atelectasis. Electronically Signed   By: Lavonia Dana M.D.   On: 12/23/2018 10:58   Ct Angio Chest Pe W And/or Wo Contrast  Result Date: 12/21/2018 CLINICAL DATA:  48 y.o. female discharged yesterday for concerns of abdominal pain with a work-up regarding her liver as well as she is meeting SIRS criteria with feverPatient comes back today reports she continues to have significant pain and discomfort.  She has been having ongoing and severe pain located in the mid to right upper abdomen, also she reports fairly significant pain when she takes a deep breath over the right lower lungShe has been feeling warm had a fever when she was in the hospital yesterday. Some nausea. No headaches. Tested negative for Covid a few days ago pain is located primarily in the right upper abdomen under the right rib cage. EXAM: CT ANGIOGRAPHY CHEST CT ABDOMEN AND PELVIS WITH CONTRAST TECHNIQUE: Multidetector CT imaging of the chest was performed using the standard protocol during bolus administration of intravenous contrast. Multiplanar CT image reconstructions and MIPs were obtained to evaluate the vascular anatomy. Multidetector CT imaging of the abdomen and pelvis was performed using the standard protocol during bolus administration of intravenous contrast. CONTRAST:  14mL OMNIPAQUE IOHEXOL 350 MG/ML SOLN COMPARISON:  Chest CTA and abdomen and pelvis CT  dated 12/19/2018. FINDINGS: CTA CHEST FINDINGS Cardiovascular: There is satisfactory opacification of the pulmonary arteries to the segmental level. No evidence of a pulmonary embolism. Mediastinum/Nodes: No enlarged mediastinal, hilar, or axillary lymph nodes. Thyroid gland, trachea, and esophagus demonstrate no significant findings. Lungs/Pleura: Small bilateral pleural effusions. There is dependent opacity in the lower lobes consistent with atelectasis. Small areas of ground-glass opacity and small lung nodules noted. Dominant nodule in the right upper lobe, image 46, series 4, 6 mm. Several other small nodules are new from the prior CT. Area of ground-glass opacity adjacent to the oblique fissure in the left upper lobe is new. No pneumothorax. Musculoskeletal: No chest wall abnormality. No acute or significant osseous findings. Review of the MIP images confirms the above findings. CT ABDOMEN and PELVIS FINDINGS Hepatobiliary: Enlarged liver. There are multiple hypoattenuating areas throughout the liver. These areas are more apparent and more extensive than on the recent prior CT, particularly evident at the dome of segment 4A, along the anterior aspect of the right lobe and medial segment of the left lobe and along the inferior aspect of the right lobe, segment 5 and 6. Gallbladder is mostly collapsed. There is increased attenuation material within the gallbladder similar to the prior CT. No bile duct dilation Pancreas: Unremarkable. No pancreatic ductal dilatation or surrounding inflammatory changes. Spleen: Normal in size without focal abnormality. Adrenals/Urinary Tract: No adrenal masses. Right kidney displaced inferiorly by the enlarged liver. Symmetric renal enhancement and excretion. No renal masses, stones or hydronephrosis. Normal ureters. Bladder wall is prominent, unchanged from the recent prior study. No bladder masses. Stomach/Bowel: Stomach is unremarkable, mostly decompressed. Small bowel and  colon are normal in caliber. No wall thickening or inflammation. Appendix not discretely seen. No findings of appendicitis. Vascular/Lymphatic: As noted on the prior CT, left portal vein is attenuated, but does show enhancement. No evidence of thrombosis. There are venous collaterals in the upper abdomen. Aorta is unremarkable. No enlarged lymph nodes. Prominent Peri celiac node noted on the prior study is stable. Reproductive: Status post hysterectomy. No adnexal masses. Other: Small amount of ascites increased when compared to the prior CT. Musculoskeletal: No acute or significant osseous findings. Review of the MIP images confirms the above findings. IMPRESSION: CHEST CTA 1. No evidence of a pulmonary embolism. 2. Small pleural effusions, new from the prior CT. There is increased opacity in the lower lobes that is consistent with atelectasis. There several new small nodules and small areas of ground-glass opacity when compared to the prior CT. Suspect these are inflammatory given change from the recent exam. ABDOMEN AND PELVIS CT 1. Multiple  hypoattenuating liver lesions. These appear to have progressed when compared to the prior CT, although this change could be due to differences in contrast timing. The liver is enlarged, stable from the recent exam. Findings are concerning for multifocal hepatocellular carcinoma. Follow-up liver MRI without and with contrast, when the patient can tolerate the procedure, is recommended. 2. Small amount ascites has increased in quantity from the prior study. 3. There are vascular collaterals in the upper abdomen, stable. Electronically Signed   By: Lajean Manes M.D.   On: 12/21/2018 18:36   Ct Angio Chest Pe W And/or Wo Contrast  Result Date: 12/19/2018 CLINICAL DATA:  Chest pain, complex, intermediate/high prob of ACS/PE/AAS; Abd distension Epigastric pain. Exertional dyspnea. Cough. Weakness. History of breast cancer. EXAM: CT ANGIOGRAPHY CHEST CT ABDOMEN AND PELVIS WITH  CONTRAST TECHNIQUE: Multidetector CT imaging of the chest was performed using the standard protocol during bolus administration of intravenous contrast. Multiplanar CT image reconstructions and MIPs were obtained to evaluate the vascular anatomy. Multidetector CT imaging of the abdomen and pelvis was performed using the standard protocol during bolus administration of intravenous contrast. No reported contrast reaction. CONTRAST:  12mL OMNIPAQUE IOHEXOL 350 MG/ML SOLN COMPARISON:  Abdominal CT without contrast yesterday. Chest CT 01/01/2018 FINDINGS: CTA CHEST FINDINGS Cardiovascular: There are no filling defects within the pulmonary arteries to suggest pulmonary embolus. Thoracic aorta is normal in caliber without dissection. Left vertebral artery arises directly from the thoracic aorta, variant arch anatomy. Heart is normal in size. No pericardial effusion. Right chest port in place with tip in the SVC. Mediastinum/Nodes: No mediastinal or hilar adenopathy. No visualized thyroid nodule. No esophageal wall thickening. Lungs/Pleura: Mild patchy areas of subpleural nodular and ground-glass opacities. Basilar findings are unchanged from CT yesterday. 5 mm right upper lobe pulmonary nodule series 6, image 41. Sub solid nodularity slightly more inferiorly in the right lung series 6, image 45 measuring 8 mm. Tiny 2 mm nodule in the right upper lung series 6, image 32, may be fissural. Pulmonary nodules are new from prior chest CT. Perifissural in subpleural opacity in the right upper lobe, also series 6, image 32, nonspecific. No pulmonary edema. No pleural fluid. Trachea and mainstem bronchi are patent. Musculoskeletal: There are no acute or suspicious osseous abnormalities. Review of the MIP images confirms the above findings. CT ABDOMEN and PELVIS FINDINGS Hepatobiliary: Hepatic parenchyma diffusely heterogeneous with nodular contours. Liver is increased in size from December 2019 CT. Gallbladder is decompressed and  not well assessed, intraluminal high density may be stones or sludge. Questionable wall thickening may be related to nondistention. Pancreas: No ductal dilatation or inflammation. Spleen: Normal in size without focal abnormality. Adrenals/Urinary Tract: Normal adrenal glands. No hydronephrosis or perinephric edema. Homogeneous renal enhancement with symmetric excretion on delayed phase imaging. Urinary bladder is partially distended, mild bladder wall thickening. Stomach/Bowel: Lack of enteric contrast and paucity of intra-abdominal fat limits detailed bowel assessment. The stomach is nondistended. Questionable pre-pyloric gastric wall thickening. No small bowel obstruction or inflammatory change. Appendix not discretely visualized, no pericecal inflammation to suggest appendicitis. Transverse colon is tortuous. Mild colonic wall thickening at the splenic flexure, colon is decompressed, this is not well assessed. Questionable areas of descending colonic wall thickening, for example series 11, image 61. Vascular/Lymphatic: Right portal vein is patent. The left portal vein is attenuated without evidence of thrombus. Tortuous splenic vein with prominent collaterals in the left upper quadrant and left retroperitoneum. 9 mm perigastric lymph node series 11, image 29, nonspecific. No  pelvic adenopathy. Reproductive: Uterus surgically absent. Left ovary tentatively visualized and normal. Right ovary not definitively seen. No obvious adnexal mass. Other: Small volume of pelvic ascites measuring simple fluid density, similar to yesterday. Small perihepatic ascites anteriorly. No free air. Musculoskeletal: There are no acute or suspicious osseous abnormalities. Scattered bone islands in the pelvis. Review of the MIP images confirms the above findings. IMPRESSION: CT chest: 1. No pulmonary embolus. 2. Mild patchy areas of subpleural nodular and ground-glass opacities in both lungs. Slightly more confluent  subpleural/perifissural right upper lobe opacity. Findings may be due to atelectasis or infection. 3. Small pulmonary nodules are new from December, infectious/inflammatory or metastatic. CT abdomen/pelvis: 1. Diffusely heterogeneous liver with nodular contours. Liver has increased in size since December 2019 CT. This may be due to underlying cirrhotic change versus diffuse metastatic disease. Recommend further evaluation with abdominal MRI. Left upper quadrant and retroperitoneal collaterals can be seen with portal hypertension. Left portal vein is attenuated, however no discrete thrombus. 2. Questionable pre-pyloric gastric wall thickening, peptic ulcer disease versus gastritis. 3. Bladder wall thickening, small volume abdominopelvic ascites, and areas of colonic wall thickening at the splenic flexure and descending colon are unchanged from CT yesterday. 4. Gallbladder is decompressed and not well assessed, intraluminal high density may be stones or sludge. Questionable gallbladder wall thickening may be related to nondistention or liver disease. This could be further evaluated with MRI or right upper quadrant ultrasound. Electronically Signed   By: Keith Rake M.D.   On: 12/19/2018 05:33   Mr Abdomen W Wo Contrast  Result Date: 12/19/2018 CLINICAL DATA:  Abdominal pain in a patient with breast cancer. EXAM: MRI ABDOMEN WITHOUT AND WITH CONTRAST TECHNIQUE: Multiplanar multisequence MR imaging of the abdomen was performed both before and after the administration of intravenous contrast. CONTRAST:  21mL GADAVIST GADOBUTROL 1 MMOL/ML IV SOLN COMPARISON:  CT of 12/19/2018 FINDINGS: Lower chest: Limited assessment of the lower chest on MRI is unremarkable. Hepatobiliary: Diffuse infiltration of much of the liver, nearly the entire left hepatic lobe, medial section and lateral section in addition to anterior right hepatic lobe with confluent infiltrative restricted diffusion and heterogeneous enhancement  measuring approximately 21 by 7.4 cm. Diffuse metastatic disease is strongly suspected. Boundaries are well-demarcated without adjacent edema in the bordering hepatic parenchyma Discrete hepatic lesions are also scattered about the right hepatic lobe largest in the dome of the right hemi liver measuring approximately 2.5 cm in greatest dimension. These discrete lesions display a targetoid appearance. There are numerous lesions in the right hepatic lobe, lesions are found in all hepatic subsegment. Pancreas: No mass, inflammatory changes, or other parenchymal abnormality identified. Mild ductal distension in the tail of the pancreas may be due to adjacent mass effect. No visible pancreatic lesion is noted. Spleen:  Within normal limits in size and appearance. Adrenals/Urinary Tract: No masses identified. No evidence of hydronephrosis. Stomach/Bowel: Limited assessment of the gastrointestinal tract without signs of acute process. Vascular/Lymphatic: Left-sided peri-renal venous collaterals of uncertain significance perhaps related to elevated portal pressures in the setting of diffuse hepatic involvement. 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 Right portal vein is patent. Scattered small lymph nodes in the upper abdomen. There is either slow flow or thrombus within the main portal vein at the level of the splenic portal confluence extending in the splenic vein. The SMV is patent. There is mass effect upon the vessel at the level of the splenic portal confluence due to hepatic enlargement. This is best seen on subtraction  images, image 54, series 20. Note that slow flow in an otherwise opacified vessel can cause artifact with a similar appearance however, this is seen on contrasted and non contrasted imaging with a similar appearance. Other:  None. Musculoskeletal: No signs of acute or destructive bone process. IMPRESSION: 1. Diffuse infiltration of much of the liver, nearly the entire left hepatic lobe, with confluent  infiltrative restricted diffusion and heterogeneous enhancement measuring approximately 21 by 7.4 cm. Diffuse metastatic disease is strongly suspected with multifocal lesions in the right hepatic lobe. Superimposed infection is difficult to exclude though areas at the boundary of these lesions show no overt signs of edema. 2. Highly narrowed or occluded left portal vein with potential thrombus versus slow flow in the splenoportal confluence. This was patent on the exam of 12/19/2018; however, given above findings a venous phase CT or even ultrasound hepatic Doppler with special attention to the splenic portal confluence may be helpful to exclude this possibility, finding is also exhibited on TrueFISP sequence (image 24, series 10). 3. 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 4. Mild ductal distension in the tail of the pancreas, potentially due to mass effect. Attention on follow-up. 5. Critical Value/emergent results were called by telephone at the time of interpretation on 12/19/2018 at 6:43 pm to [place] , who verbally acknowledged these results. Electronically Signed   By: Zetta Bills M.D.   On: 12/19/2018 18:24   US Abdomen Complete  Result Date: 12/19/2018 CLINICAL DATA:  Abdominal pain EXAM: ABDOMEN ULTRASOUND COMPLETE COMPARISON:  MRI earlier today FINDINGS: Gallbladder: Gallbladder is contracted with associated mild wall thickening measuring up to 4 mm. Sludge noted within the gallbladder. No visible stones. Common bile duct: Diameter: Normal caliber, 3 mm Liver: Diffusely heterogeneous echotexture throughout the liver corresponding to the abnormality seen on today's MRI suspicious forinfiltrating mass. Main portal vein is patent on color Doppler imaging with normal direction of blood flow towards the liver. The area of possible thrombus seen on MRI at the level of the splenic portal  confluence not visualized. IVC: No abnormality visualized. Pancreas: Visualized portion unremarkable. Spleen: Size and appearance within normal limits. Right Kidney: Length: 11.0 cm. Echogenicity within normal limits. No mass or hydronephrosis visualized. Left Kidney: Length: 10.5 cm. Echogenicity within normal limits. No mass or hydronephrosis visualized. Abdominal aorta: No aneurysm visualized. Other findings: None. IMPRESSION: Heterogeneous appearance throughout much of the left hepatic lobe as seen on today's MRI concerning for infiltrating mass/tumor. Main portal vein is patent. Contracted gallbladder with gallbladder wall thickening likely related to contracted state. Sludge within the gallbladder. No sonographic Murphy sign or visible stones. Electronically Signed   By: Rolm Baptise M.D.   On: 12/19/2018 20:35   Ct Abdomen Pelvis W Contrast  Result Date: 12/21/2018 CLINICAL DATA:  48 y.o. female discharged yesterday for concerns of abdominal pain with a work-up regarding her liver as well as she is meeting SIRS criteria with feverPatient comes back today reports she continues to have significant pain and discomfort. She has been having ongoing and severe pain located in the mid to right upper abdomen, also she reports fairly significant pain when she takes a deep breath over the right lower lungShe has been feeling warm had a fever when she was in the hospital yesterday. Some nausea. No headaches. Tested negative for Covid a few days ago pain is located primarily in the right upper abdomen under the right rib  cage. EXAM: CT ANGIOGRAPHY CHEST CT ABDOMEN AND PELVIS WITH CONTRAST TECHNIQUE: Multidetector CT imaging of the chest was performed using the standard protocol during bolus administration of intravenous contrast. Multiplanar CT image reconstructions and MIPs were obtained to evaluate the vascular anatomy. Multidetector CT imaging of the abdomen and pelvis was performed using the standard protocol  during bolus administration of intravenous contrast. CONTRAST:  66mL OMNIPAQUE IOHEXOL 350 MG/ML SOLN COMPARISON:  Chest CTA and abdomen and pelvis CT dated 12/19/2018. FINDINGS: CTA CHEST FINDINGS Cardiovascular: There is satisfactory opacification of the pulmonary arteries to the segmental level. No evidence of a pulmonary embolism. Mediastinum/Nodes: No enlarged mediastinal, hilar, or axillary lymph nodes. Thyroid gland, trachea, and esophagus demonstrate no significant findings. Lungs/Pleura: Small bilateral pleural effusions. There is dependent opacity in the lower lobes consistent with atelectasis. Small areas of ground-glass opacity and small lung nodules noted. Dominant nodule in the right upper lobe, image 46, series 4, 6 mm. Several other small nodules are new from the prior CT. Area of ground-glass opacity adjacent to the oblique fissure in the left upper lobe is new. No pneumothorax. Musculoskeletal: No chest wall abnormality. No acute or significant osseous findings. Review of the MIP images confirms the above findings. CT ABDOMEN and PELVIS FINDINGS Hepatobiliary: Enlarged liver. There are multiple hypoattenuating areas throughout the liver. These areas are more apparent and more extensive than on the recent prior CT, particularly evident at the dome of segment 4A, along the anterior aspect of the right lobe and medial segment of the left lobe and along the inferior aspect of the right lobe, segment 5 and 6. Gallbladder is mostly collapsed. There is increased attenuation material within the gallbladder similar to the prior CT. No bile duct dilation Pancreas: Unremarkable. No pancreatic ductal dilatation or surrounding inflammatory changes. Spleen: Normal in size without focal abnormality. Adrenals/Urinary Tract: No adrenal masses. Right kidney displaced inferiorly by the enlarged liver. Symmetric renal enhancement and excretion. No renal masses, stones or hydronephrosis. Normal ureters. Bladder wall is  prominent, unchanged from the recent prior study. No bladder masses. Stomach/Bowel: Stomach is unremarkable, mostly decompressed. Small bowel and colon are normal in caliber. No wall thickening or inflammation. Appendix not discretely seen. No findings of appendicitis. Vascular/Lymphatic: As noted on the prior CT, left portal vein is attenuated, but does show enhancement. No evidence of thrombosis. There are venous collaterals in the upper abdomen. Aorta is unremarkable. No enlarged lymph nodes. Prominent Peri celiac node noted on the prior study is stable. Reproductive: Status post hysterectomy. No adnexal masses. Other: Small amount of ascites increased when compared to the prior CT. Musculoskeletal: No acute or significant osseous findings. Review of the MIP images confirms the above findings. IMPRESSION: CHEST CTA 1. No evidence of a pulmonary embolism. 2. Small pleural effusions, new from the prior CT. There is increased opacity in the lower lobes that is consistent with atelectasis. There several new small nodules and small areas of ground-glass opacity when compared to the prior CT. Suspect these are inflammatory given change from the recent exam. ABDOMEN AND PELVIS CT 1. Multiple hypoattenuating liver lesions. These appear to have progressed when compared to the prior CT, although this change could be due to differences in contrast timing. The liver is enlarged, stable from the recent exam. Findings are concerning for multifocal hepatocellular carcinoma. Follow-up liver MRI without and with contrast, when the patient can tolerate the procedure, is recommended. 2. Small amount ascites has increased in quantity from the prior study. 3. There are  vascular collaterals in the upper abdomen, stable. Electronically Signed   By: Lajean Manes M.D.   On: 12/21/2018 18:36   Ct Abdomen Pelvis W Contrast  Result Date: 12/19/2018 CLINICAL DATA:  Chest pain, complex, intermediate/high prob of ACS/PE/AAS; Abd  distension Epigastric pain. Exertional dyspnea. Cough. Weakness. History of breast cancer. EXAM: CT ANGIOGRAPHY CHEST CT ABDOMEN AND PELVIS WITH CONTRAST TECHNIQUE: Multidetector CT imaging of the chest was performed using the standard protocol during bolus administration of intravenous contrast. Multiplanar CT image reconstructions and MIPs were obtained to evaluate the vascular anatomy. Multidetector CT imaging of the abdomen and pelvis was performed using the standard protocol during bolus administration of intravenous contrast. No reported contrast reaction. CONTRAST:  186mL OMNIPAQUE IOHEXOL 350 MG/ML SOLN COMPARISON:  Abdominal CT without contrast yesterday. Chest CT 01/01/2018 FINDINGS: CTA CHEST FINDINGS Cardiovascular: There are no filling defects within the pulmonary arteries to suggest pulmonary embolus. Thoracic aorta is normal in caliber without dissection. Left vertebral artery arises directly from the thoracic aorta, variant arch anatomy. Heart is normal in size. No pericardial effusion. Right chest port in place with tip in the SVC. Mediastinum/Nodes: No mediastinal or hilar adenopathy. No visualized thyroid nodule. No esophageal wall thickening. Lungs/Pleura: Mild patchy areas of subpleural nodular and ground-glass opacities. Basilar findings are unchanged from CT yesterday. 5 mm right upper lobe pulmonary nodule series 6, image 41. Sub solid nodularity slightly more inferiorly in the right lung series 6, image 45 measuring 8 mm. Tiny 2 mm nodule in the right upper lung series 6, image 32, may be fissural. Pulmonary nodules are new from prior chest CT. Perifissural in subpleural opacity in the right upper lobe, also series 6, image 32, nonspecific. No pulmonary edema. No pleural fluid. Trachea and mainstem bronchi are patent. Musculoskeletal: There are no acute or suspicious osseous abnormalities. Review of the MIP images confirms the above findings. CT ABDOMEN and PELVIS FINDINGS Hepatobiliary:  Hepatic parenchyma diffusely heterogeneous with nodular contours. Liver is increased in size from December 2019 CT. Gallbladder is decompressed and not well assessed, intraluminal high density may be stones or sludge. Questionable wall thickening may be related to nondistention. Pancreas: No ductal dilatation or inflammation. Spleen: Normal in size without focal abnormality. Adrenals/Urinary Tract: Normal adrenal glands. No hydronephrosis or perinephric edema. Homogeneous renal enhancement with symmetric excretion on delayed phase imaging. Urinary bladder is partially distended, mild bladder wall thickening. Stomach/Bowel: Lack of enteric contrast and paucity of intra-abdominal fat limits detailed bowel assessment. The stomach is nondistended. Questionable pre-pyloric gastric wall thickening. No small bowel obstruction or inflammatory change. Appendix not discretely visualized, no pericecal inflammation to suggest appendicitis. Transverse colon is tortuous. Mild colonic wall thickening at the splenic flexure, colon is decompressed, this is not well assessed. Questionable areas of descending colonic wall thickening, for example series 11, image 61. Vascular/Lymphatic: Right portal vein is patent. The left portal vein is attenuated without evidence of thrombus. Tortuous splenic vein with prominent collaterals in the left upper quadrant and left retroperitoneum. 9 mm perigastric lymph node series 11, image 29, nonspecific. No pelvic adenopathy. Reproductive: Uterus surgically absent. Left ovary tentatively visualized and normal. Right ovary not definitively seen. No obvious adnexal mass. Other: Small volume of pelvic ascites measuring simple fluid density, similar to yesterday. Small perihepatic ascites anteriorly. No free air. Musculoskeletal: There are no acute or suspicious osseous abnormalities. Scattered bone islands in the pelvis. Review of the MIP images confirms the above findings. IMPRESSION: CT chest: 1. No  pulmonary embolus. 2. Mild  patchy areas of subpleural nodular and ground-glass opacities in both lungs. Slightly more confluent subpleural/perifissural right upper lobe opacity. Findings may be due to atelectasis or infection. 3. Small pulmonary nodules are new from December, infectious/inflammatory or metastatic. CT abdomen/pelvis: 1. Diffusely heterogeneous liver with nodular contours. Liver has increased in size since December 2019 CT. This may be due to underlying cirrhotic change versus diffuse metastatic disease. Recommend further evaluation with abdominal MRI. Left upper quadrant and retroperitoneal collaterals can be seen with portal hypertension. Left portal vein is attenuated, however no discrete thrombus. 2. Questionable pre-pyloric gastric wall thickening, peptic ulcer disease versus gastritis. 3. Bladder wall thickening, small volume abdominopelvic ascites, and areas of colonic wall thickening at the splenic flexure and descending colon are unchanged from CT yesterday. 4. Gallbladder is decompressed and not well assessed, intraluminal high density may be stones or sludge. Questionable gallbladder wall thickening may be related to nondistention or liver disease. This could be further evaluated with MRI or right upper quadrant ultrasound. Electronically Signed   By: Keith Rake M.D.   On: 12/19/2018 05:33   US Biopsy (liver)  Result Date: 12/20/2018 INDICATION: History of left breast carcinoma with imaging evidence of probable diffuse metastatic disease in the liver, more prominently in the left lobe. The patient presents for biopsy. EXAM: ULTRASOUND GUIDED CORE BIOPSY OF LIVER MEDICATIONS: None. ANESTHESIA/SEDATION: Fentanyl 100 mcg IV; Versed 2.0 mg IV Moderate Sedation Time:  20 minutes. The patient was continuously monitored during the procedure by the interventional radiology nurse under my direct supervision. PROCEDURE: The procedure, risks, benefits, and alternatives were explained to the  patient. Questions regarding the procedure were encouraged and answered. The patient understands and consents to the procedure. A time-out was performed prior to initiating the procedure. Ultrasound was used to localize lesions within the liver. The anterior abdominal wall was prepped with chlorhexidine in a sterile fashion, and a sterile drape was applied covering the operative field. A sterile gown and sterile gloves were used for the procedure. Local anesthesia was provided with 1% Lidocaine. Under direct ultrasound guidance, a 17 gauge trocar needle was advanced into the left lobe of the liver. After confirming needle tip position, coaxial 18 gauge core biopsy samples were obtained. A total of 3 samples were obtained and submitted in formalin. A slurry of Gel-Foam pledgets was then slowly injected via the outer needle as the needle was retracted. Additional ultrasound was performed. COMPLICATIONS: None immediate. FINDINGS: Ultrasound demonstrates heterogeneous appearance of the liver with ill-defined lesions throughout the left lobe. Solid core biopsy samples were obtained by sampling lesions within the lateral segment of the left lobe. IMPRESSION: Ultrasound-guided core biopsy performed of hepatic lesions within the left lobe. Electronically Signed   By: Aletta Edouard M.D.   On: 12/20/2018 13:44   Dg Chest Portable 1 View  Result Date: 12/21/2018 CLINICAL DATA:  Pleuritic right chest pain EXAM: PORTABLE CHEST 1 VIEW COMPARISON:  12/18/2018 chest radiograph. FINDINGS: Stable right subclavian Port-A-Cath terminating over the right atrium. Stable cardiomediastinal silhouette with normal heart size. No pneumothorax. No pleural effusion. No pulmonary edema. Curvilinear left lung base opacities. No acute consolidative airspace disease. IMPRESSION: Curvilinear left lung base opacities, favor scarring or atelectasis. No acute consolidative airspace disease. Electronically Signed   By: Ilona Sorrel M.D.   On:  12/21/2018 13:06   Dg Chest Portable 1 View  Result Date: 12/18/2018 CLINICAL DATA:  Fever abdominal pain EXAM: PORTABLE CHEST 1 VIEW COMPARISON:  12/31/2017 FINDINGS: Right-sided central venous port with  tip over the cavoatrial region. No focal airspace disease or effusion. Normal heart size. No pneumothorax. IMPRESSION: No active disease. Electronically Signed   By: Donavan Foil M.D.   On: 12/18/2018 19:49       Subjective: No fevers overnight More awake and alert this morning interacting  Discharge Exam: Vitals:   12/26/18 2330 12/27/18 0513  BP:  (!) 144/96  Pulse:  75  Resp:  20  Temp: 98.1 F (36.7 C) (!) 97.5 F (36.4 C)  SpO2:  98%   Vitals:   12/26/18 1750 12/26/18 2103 12/26/18 2330 12/27/18 0513  BP:  (!) 127/92  (!) 144/96  Pulse: 93 81  75  Resp:  20  20  Temp: 98 F (36.7 C) 97.7 F (36.5 C) 98.1 F (36.7 C) (!) 97.5 F (36.4 C)  TempSrc:  Oral Oral Oral  SpO2: 98% 98%  98%  Weight:      Height:        General: Pt is alert, awake, not in acute distress Cardiovascular: RRR, S1/S2 +, no rubs, no gallops Respiratory: CTA bilaterally, no wheezing, no rhonchi Abdominal: Soft, mildly tender to palpation at site of tumor mass, ND, bowel sounds + Extremities: no edema, no cyanosis    The results of significant diagnostics from this hospitalization (including imaging, microbiology, ancillary and laboratory) are listed below for reference.     Microbiology: Recent Results (from the past 240 hour(s))  SARS CORONAVIRUS 2 (TAT 6-24 HRS) Nasopharyngeal Nasopharyngeal Swab     Status: None   Collection Time: 12/18/18  7:20 PM   Specimen: Nasopharyngeal Swab  Result Value Ref Range Status   SARS Coronavirus 2 NEGATIVE NEGATIVE Final    Comment: (NOTE) SARS-CoV-2 target nucleic acids are NOT DETECTED. The SARS-CoV-2 RNA is generally detectable in upper and lower respiratory specimens during the acute phase of infection. Negative results do not preclude  SARS-CoV-2 infection, do not rule out co-infections with other pathogens, and should not be used as the sole basis for treatment or other patient management decisions. Negative results must be combined with clinical observations, patient history, and epidemiological information. The expected result is Negative. Fact Sheet for Patients: SugarRoll.be Fact Sheet for Healthcare Providers: https://www.woods-mathews.com/ This test is not yet approved or cleared by the [place] FDA and  has been authorized for detection and/or diagnosis of SARS-CoV-2 by FDA under an Emergency Use Authorization (EUA). This EUA will remain  in effect (meaning this test can be used) for the duration of the COVID-19 declaration under Section 56 4(b)(1) of the Act, 21 U.S.C. section 360bbb-3(b)(1), unless the authorization is terminated or revoked sooner. Performed at [facility], [place] [address] [place], [place] [address]   Blood culture (routine x 2)     Status: None   Collection Time: 12/18/18  7:57 PM   Specimen: BLOOD  Result Value Ref Range Status   Specimen Description BLOOD PORTA CATH  Final   Special Requests   Final    BOTTLES DRAWN AEROBIC AND ANAEROBIC Blood Culture adequate volume   Culture   Final    NO GROWTH 5 DAYS Performed at [facility], [address] [place],  [address]    Report Status 12/23/2018 FINAL  Final  Blood culture (routine x 2)     Status: None   Collection Time: 12/18/18  7:57 PM   Specimen: BLOOD  Result Value Ref Range Status   Specimen Description BLOOD PORTA CATH  Final   Special Requests  Final    BOTTLES DRAWN AEROBIC AND ANAEROBIC Blood Culture adequate volume   Culture   Final    NO GROWTH 5 DAYS Performed at [facility], [address] [place], [place] [address]    Report Status 12/23/2018 FINAL  Final  SARS CORONAVIRUS 2 (TAT 6-24 HRS) Nasopharyngeal Nasopharyngeal Swab      Status: None   Collection Time: 12/21/18  4:24 PM   Specimen: Nasopharyngeal Swab  Result Value Ref Range Status   SARS Coronavirus 2 NEGATIVE NEGATIVE Final    Comment: (NOTE) SARS-CoV-2 target nucleic acids are NOT DETECTED. The SARS-CoV-2 RNA is generally detectable in upper and lower respiratory specimens during the acute phase of infection. Negative results do not preclude SARS-CoV-2 infection, do not rule out co-infections with other pathogens, and should not be used as the sole basis for treatment or other patient management decisions. Negative results must be combined with clinical observations, patient history, and epidemiological information. The expected result is Negative. Fact Sheet for Patients: SugarRoll.be Fact Sheet for Healthcare Providers: https://www.woods-mathews.com/ This test is not yet approved or cleared by the [place] FDA and  has been authorized for detection and/or diagnosis of SARS-CoV-2 by FDA under an Emergency Use Authorization (EUA). This EUA will remain  in effect (meaning this test can be used) for the duration of the COVID-19 declaration under Section 56 4(b)(1) of the Act, 21 U.S.C. section 360bbb-3(b)(1), unless the authorization is terminated or revoked sooner. Performed at [facility], [place] [address], [place], [place] [address]   CULTURE, BLOOD (ROUTINE X 2) w Reflex to ID Panel     Status: None   Collection Time: 12/21/18  4:24 PM   Specimen: BLOOD  Result Value Ref Range Status   Specimen Description BLOOD R WRIST  Final   Special Requests   Final    BOTTLES DRAWN AEROBIC AND ANAEROBIC Blood Culture adequate volume   Culture   Final    NO GROWTH 5 DAYS Performed at [facility], [address] [place], [place] [address]    Report Status 12/26/2018 FINAL  Final  CULTURE, BLOOD (ROUTINE X 2) w Reflex to ID Panel     Status: None   Collection Time: 12/21/18  4:24 PM    Specimen: BLOOD  Result Value Ref Range Status   Specimen Description BLOOD RAC  Final   Special Requests   Final    BOTTLES DRAWN AEROBIC AND ANAEROBIC Blood Culture adequate volume   Culture   Final    NO GROWTH 5 DAYS Performed at [facility], [address] [place], [place] [address]    Report Status 12/26/2018 FINAL  Final  CULTURE, BLOOD (ROUTINE X 2) w Reflex to ID Panel     Status: None (Preliminary result)   Collection Time: 12/23/18 10:04 AM   Specimen: BLOOD  Result Value Ref Range Status   Specimen Description BLOOD BLOOD RIGHT HAND  Final   Special Requests   Final    BOTTLES DRAWN AEROBIC AND ANAEROBIC Blood Culture adequate volume   Culture   Final    NO GROWTH 4 DAYS Performed at [facility], [address] [place], [place] [address]    Report Status PENDING  Incomplete  CULTURE, BLOOD (ROUTINE X 2) w Reflex to ID Panel     Status: None (Preliminary result)   Collection Time: 12/23/18 10:11 AM   Specimen: BLOOD  Result Value Ref Range Status   Specimen Description BLOOD BLOOD  RIGHT HAND  Final   Special Requests   Final    BOTTLES DRAWN AEROBIC AND ANAEROBIC Blood Culture results may not be optimal due to an excessive volume of blood received in culture bottles   Culture   Final    NO GROWTH 4 DAYS Performed at [facility], [address] [place], [place] [address]    Report Status PENDING  Incomplete     Labs: BNP (last 3 results) No results for input(s): BNP in the last 8760 hours. Basic Metabolic Panel: Recent Labs  Lab 12/21/18 1009 12/22/18 0822 12/23/18 0414 12/23/18 2102 12/25/18 0400  NA 129* 133* 130* 131* 134*  K 4.0 4.1 4.2 3.6 3.4*  CL 91* 94* 95* 94* 96*  CO2 26 27 24 25 27   GLUCOSE 139* 114* 131* 165* 110*  BUN 12 11 10 10 9   CREATININE 0.72 0.45 0.49 0.63 0.46  CALCIUM 8.6* 8.7* 8.2* 8.2* 8.2*   Liver Function Tests: Recent Labs  Lab 12/21/18 1009 12/23/18 0414 12/23/18 2102  AST 269* 223*  199*  ALT 158* 138* 124*  ALKPHOS 116 98 101  BILITOT 1.1 0.9 1.0  PROT 6.9 5.6* 5.5*  ALBUMIN 3.5 2.8* 2.6*   Recent Labs  Lab 12/21/18 1009  LIPASE 27   No results for input(s): AMMONIA in the last 168 hours. CBC: Recent Labs  Lab 12/21/18 1009 12/22/18 0555 12/23/18 0414 12/23/18 2102 12/25/18 0400  WBC 6.9 4.6 5.4 3.9* 4.6  NEUTROABS  --   --   --  3.0  --   HGB 11.8* 11.3* 10.2* 10.3* 9.0*  HCT 33.2* 33.3* 29.8* 28.5* 26.5*  MCV 92.0 96.0 95.5 91.3 94.0  PLT 62* 39* 45* 38* 49*   Cardiac Enzymes: No results for input(s): CKTOTAL, CKMB, CKMBINDEX, TROPONINI in the last 168 hours. BNP: Invalid input(s): POCBNP CBG: No results for input(s): GLUCAP in the last 168 hours. D-Dimer No results for input(s): DDIMER in the last 72 hours. Hgb A1c No results for input(s): HGBA1C in the last 72 hours. Lipid Profile No results for input(s): CHOL, HDL, LDLCALC, TRIG, CHOLHDL, LDLDIRECT in the last 72 hours. Thyroid function studies No results for input(s): TSH, T4TOTAL, T3FREE, THYROIDAB in the last 72 hours.  Invalid input(s): FREET3 Anemia work up No results for input(s): VITAMINB12, FOLATE, FERRITIN, TIBC, IRON, RETICCTPCT in the last 72 hours. Urinalysis    Component Value Date/Time   COLORURINE YELLOW (A) 12/21/2018 1212   APPEARANCEUR CLEAR (A) 12/21/2018 1212   LABSPEC 1.012 12/21/2018 1212   PHURINE 6.0 12/21/2018 1212   GLUCOSEU NEGATIVE 12/21/2018 1212   HGBUR SMALL (A) 12/21/2018 1212   BILIRUBINUR NEGATIVE 12/21/2018 1212   BILIRUBINUR neg 05/16/2016 1537   KETONESUR NEGATIVE 12/21/2018 1212   PROTEINUR NEGATIVE 12/21/2018 1212   UROBILINOGEN 0.2 05/16/2016 1537   NITRITE NEGATIVE 12/21/2018 1212   LEUKOCYTESUR NEGATIVE 12/21/2018 1212   [facility] Invalid input(s): PROCALCITONIN,  WBC,  LACTICIDVEN Microbiology Recent Results (from the past 240 hour(s))  SARS CORONAVIRUS 2 (TAT 6-24 HRS) Nasopharyngeal Nasopharyngeal Swab     Status: None    Collection Time: 12/18/18  7:20 PM   Specimen: Nasopharyngeal Swab  Result Value Ref Range Status   SARS Coronavirus 2 NEGATIVE NEGATIVE Final    Comment: (NOTE) SARS-CoV-2 target nucleic acids are NOT DETECTED. The SARS-CoV-2 RNA is generally detectable in upper and lower respiratory specimens during the acute phase of infection. Negative results do not preclude SARS-CoV-2 infection, do not rule out co-infections with  other pathogens, and should not be used as the sole basis for treatment or other patient management decisions. Negative results must be combined with clinical observations, patient history, and epidemiological information. The expected result is Negative. Fact Sheet for Patients: SugarRoll.be Fact Sheet for Healthcare Providers: https://www.woods-mathews.com/ This test is not yet approved or cleared by the [place] FDA and  has been authorized for detection and/or diagnosis of SARS-CoV-2 by FDA under an Emergency Use Authorization (EUA). This EUA will remain  in effect (meaning this test can be used) for the duration of the COVID-19 declaration under Section 56 4(b)(1) of the Act, 21 U.S.C. section 360bbb-3(b)(1), unless the authorization is terminated or revoked sooner. Performed at [facility], [address], [place], [place] [address]   Blood culture (routine x 2)     Status: None   Collection Time: 12/18/18  7:57 PM   Specimen: BLOOD  Result Value Ref Range Status   Specimen Description BLOOD PORTA CATH  Final   Special Requests   Final    BOTTLES DRAWN AEROBIC AND ANAEROBIC Blood Culture adequate volume   Culture   Final    NO GROWTH 5 DAYS Performed at [facility], [address], [place], [place] [address]    Report Status 12/23/2018 FINAL  Final  Blood culture (routine x 2)     Status: None   Collection Time: 12/18/18  7:57 PM   Specimen: BLOOD  Result Value Ref Range Status   Specimen  Description BLOOD PORTA CATH  Final   Special Requests   Final    BOTTLES DRAWN AEROBIC AND ANAEROBIC Blood Culture adequate volume   Culture   Final    NO GROWTH 5 DAYS Performed at [facility], [address], [place], [place] [address]    Report Status 12/23/2018 FINAL  Final  SARS CORONAVIRUS 2 (TAT 6-24 HRS) Nasopharyngeal Nasopharyngeal Swab     Status: None   Collection Time: 12/21/18  4:24 PM   Specimen: Nasopharyngeal Swab  Result Value Ref Range Status   SARS Coronavirus 2 NEGATIVE NEGATIVE Final    Comment: (NOTE) SARS-CoV-2 target nucleic acids are NOT DETECTED. The SARS-CoV-2 RNA is generally detectable in upper and lower respiratory specimens during the acute phase of infection. Negative results do not preclude SARS-CoV-2 infection, do not rule out co-infections with other pathogens, and should not be used as the sole basis for treatment or other patient management decisions. Negative results must be combined with clinical observations, patient history, and epidemiological information. The expected result is Negative. Fact Sheet for Patients: SugarRoll.be Fact Sheet for Healthcare Providers: https://www.woods-mathews.com/ This test is not yet approved or cleared by the [place] FDA and  has been authorized for detection and/or diagnosis of SARS-CoV-2 by FDA under an Emergency Use Authorization (EUA). This EUA will remain  in effect (meaning this test can be used) for the duration of the COVID-19 declaration under Section 56 4(b)(1) of the Act, 21 U.S.C. section 360bbb-3(b)(1), unless the authorization is terminated or revoked sooner. Performed at [facility], [address], [place], [place] [address]   CULTURE, BLOOD (ROUTINE X 2) w Reflex to ID Panel     Status: None   Collection Time: 12/21/18  4:24 PM   Specimen: BLOOD  Result Value Ref Range Status   Specimen Description BLOOD R WRIST  Final    Special Requests   Final    BOTTLES DRAWN AEROBIC AND ANAEROBIC Blood Culture adequate volume  Culture   Final    NO GROWTH 5 DAYS Performed at [facility], [address] [place], [place] [address]    Report Status 12/26/2018 FINAL  Final  CULTURE, BLOOD (ROUTINE X 2) w Reflex to ID Panel     Status: None   Collection Time: 12/21/18  4:24 PM   Specimen: BLOOD  Result Value Ref Range Status   Specimen Description BLOOD RAC  Final   Special Requests   Final    BOTTLES DRAWN AEROBIC AND ANAEROBIC Blood Culture adequate volume   Culture   Final    NO GROWTH 5 DAYS Performed at [facility], [address] [place], [place] [address]    Report Status 12/26/2018 FINAL  Final  CULTURE, BLOOD (ROUTINE X 2) w Reflex to ID Panel     Status: None (Preliminary result)   Collection Time: 12/23/18 10:04 AM   Specimen: BLOOD  Result Value Ref Range Status   Specimen Description BLOOD BLOOD RIGHT HAND  Final   Special Requests   Final    BOTTLES DRAWN AEROBIC AND ANAEROBIC Blood Culture adequate volume   Culture   Final    NO GROWTH 4 DAYS Performed at [facility], [address] [place], [place] [address]    Report Status PENDING  Incomplete  CULTURE, BLOOD (ROUTINE X 2) w Reflex to ID Panel     Status: None (Preliminary result)   Collection Time: 12/23/18 10:11 AM   Specimen: BLOOD  Result Value Ref Range Status   Specimen Description BLOOD BLOOD RIGHT HAND  Final   Special Requests   Final    BOTTLES DRAWN AEROBIC AND ANAEROBIC Blood Culture results may not be optimal due to an excessive volume of blood received in culture bottles   Culture   Final    NO GROWTH 4 DAYS Performed at [facility], [address], [place], [place] [address]    Report Status PENDING  Incomplete     Time coordinating discharge: Over 30 minutes  SIGNED:   Nicolette Bang, MD  [facility] 12/27/2018, 11:34 AM Pager   If 7PM-7AM, please  contact night-coverage [email or web address] Password [email or web address]

## 2018-12-27 NOTE — Plan of Care (Signed)
  Problem: Education: Goal: Knowledge of General Education information will improve Description: Including pain rating scale, medication(s)/side effects and non-pharmacologic comfort measures Outcome: Progressing Goal: Knowledge of General Education information will improve Description: Including pain rating scale, medication(s)/side effects and non-pharmacologic comfort measures Outcome: Progressing  Pt understands her POC.  Pt does have many questions for MD concerning her hospitalization.  Will continue to offer support and answer questions appropriately.    Problem: Clinical Measurements: Goal: Ability to maintain clinical measurements within normal limits will improve Outcome: Progressing Pt's electrolyte were abnormal and she was supplemented for her imbalance.  awaiting AM labs to further assess.  Will continue to monitor and assess.    Problem: Clinical Measurements: Goal: Will remain free from infection Outcome: Progressing S/Sx of infection monitor and assessed q-shift/ PRN,along with VS.  Pt has been febrile.  Will continue to monitor and assess.    Problem: Clinical Measurements: Goal: Respiratory complications will improve Outcome: Progressing Respiratory status monitor and assessed q-shift/ PRN, along with VS.  Pt is on 2L of O2 via nasal cannula with O2 saturations at 98% and respiration rate of 20 breaths per minute.  She does not endorse SOB or DOE.  Will continue to monitor and assess.    Problem: Clinical Measurements: Goal: Cardiovascular complication will be avoided Outcome: Progressing Pt was DBP was slightly hypertensive this shift.  Pt does not endorse HA, chest pain, dizziness, n/v etc.  Will continue to monitor and assess.     Problem: Activity: Goal: Risk for activity intolerance will decrease Outcome: Progressing Pt is able to ambulate to the bathroom independently.  She is able to move freely in the bed and does not need RN staff's assistance to help  reposition herself.  Will continue to monitor and assess.    Problem: Nutrition: Goal: Adequate nutrition will be maintained Outcome: Progressing Pt is on a heart healthy diet and is able to tolerate is well.  Will continue to monitor and assess.    Problem: Coping: Goal: Level of anxiety will decrease Outcome: Progressing Pt has express c/o anxiety r/t her hospitalization.  Will continue to offer support.   Problem: Elimination: Goal: Will not experience complications related to urinary retention Outcome: Progressing Pt is able to ambulate to the bathroom independently.  She is on strict I/O's.  Will continue to monitor and assess.    Problem: Pain Managment: Goal: General experience of comfort will improve Outcome: Progressing Pt has c/o 6/10 mid right abdominal pain describing it as an intermittent ache.  Reiterate pain scale so she could adequately rate her pain.  Discussed nonpharmacological methods to help reduce s/sx of pain.  Pt stated her pain goal this admission would be 0/10.  Interventions given per pt's request and MD's orders.    Will continue to monitor and assess.    Problem: Skin Integrity: Goal: Risk for impaired skin integrity will decrease Outcome: Progressing Skin integrity monitored and assessed q-shift/ PRN, along with VS.  Instructed pt to turn q2 hours to prevent further skin impairment.  Tubes and drains assessed for device related pressures sores.  Will continue to monitor and assess.

## 2018-12-27 NOTE — Progress Notes (Signed)
Pt D/C to home via sister. IV removed intact. Prescriptions given to friend Dannette Barbara prior to d/c to be taken to [facility] per pt request. VSS. Education complete. All belongings sent with pt. All questions answered.

## 2018-12-27 NOTE — Progress Notes (Signed)
SATURATION QUALIFICATIONS: (This note is used to comply with regulatory documentation for home oxygen)  Patient Saturations on Room Air at Rest =93%  Patient Saturations on Room Air while Ambulating 86%  Patient Saturations on 2 Liters of oxygen while Ambulating =95%  Please briefly explain why patient needs home oxygen: 

## 2018-12-28 LAB — CULTURE, BLOOD (ROUTINE X 2)
Culture: NO GROWTH
Culture: NO GROWTH
Special Requests: ADEQUATE

## 2018-12-29 ENCOUNTER — Other Ambulatory Visit: Payer: Self-pay

## 2018-12-29 ENCOUNTER — Emergency Department: Payer: No Typology Code available for payment source

## 2018-12-29 ENCOUNTER — Inpatient Hospital Stay
Admission: EM | Admit: 2018-12-29 | Discharge: 2019-01-31 | DRG: 597 | Disposition: E | Payer: No Typology Code available for payment source | Attending: Family Medicine | Admitting: Family Medicine

## 2018-12-29 ENCOUNTER — Encounter: Payer: Self-pay | Admitting: Emergency Medicine

## 2018-12-29 DIAGNOSIS — R180 Malignant ascites: Secondary | ICD-10-CM | POA: Diagnosis present

## 2018-12-29 DIAGNOSIS — N390 Urinary tract infection, site not specified: Secondary | ICD-10-CM | POA: Diagnosis not present

## 2018-12-29 DIAGNOSIS — R160 Hepatomegaly, not elsewhere classified: Secondary | ICD-10-CM | POA: Diagnosis present

## 2018-12-29 DIAGNOSIS — C50912 Malignant neoplasm of unspecified site of left female breast: Secondary | ICD-10-CM

## 2018-12-29 DIAGNOSIS — Z1611 Resistance to penicillins: Secondary | ICD-10-CM | POA: Diagnosis present

## 2018-12-29 DIAGNOSIS — K5900 Constipation, unspecified: Secondary | ICD-10-CM | POA: Diagnosis not present

## 2018-12-29 DIAGNOSIS — C787 Secondary malignant neoplasm of liver and intrahepatic bile duct: Secondary | ICD-10-CM | POA: Diagnosis present

## 2018-12-29 DIAGNOSIS — R1011 Right upper quadrant pain: Secondary | ICD-10-CM

## 2018-12-29 DIAGNOSIS — D6959 Other secondary thrombocytopenia: Secondary | ICD-10-CM | POA: Diagnosis present

## 2018-12-29 DIAGNOSIS — C50919 Malignant neoplasm of unspecified site of unspecified female breast: Secondary | ICD-10-CM | POA: Diagnosis not present

## 2018-12-29 DIAGNOSIS — Z803 Family history of malignant neoplasm of breast: Secondary | ICD-10-CM

## 2018-12-29 DIAGNOSIS — N3090 Cystitis, unspecified without hematuria: Secondary | ICD-10-CM | POA: Diagnosis not present

## 2018-12-29 DIAGNOSIS — Z79891 Long term (current) use of opiate analgesic: Secondary | ICD-10-CM

## 2018-12-29 DIAGNOSIS — B961 Klebsiella pneumoniae [K. pneumoniae] as the cause of diseases classified elsewhere: Secondary | ICD-10-CM | POA: Diagnosis present

## 2018-12-29 DIAGNOSIS — Z91048 Other nonmedicinal substance allergy status: Secondary | ICD-10-CM

## 2018-12-29 DIAGNOSIS — Z20828 Contact with and (suspected) exposure to other viral communicable diseases: Secondary | ICD-10-CM | POA: Diagnosis present

## 2018-12-29 DIAGNOSIS — R0602 Shortness of breath: Secondary | ICD-10-CM

## 2018-12-29 DIAGNOSIS — R7401 Elevation of levels of liver transaminase levels: Secondary | ICD-10-CM | POA: Diagnosis present

## 2018-12-29 DIAGNOSIS — A419 Sepsis, unspecified organism: Secondary | ICD-10-CM

## 2018-12-29 DIAGNOSIS — Z885 Allergy status to narcotic agent status: Secondary | ICD-10-CM

## 2018-12-29 DIAGNOSIS — D61818 Other pancytopenia: Secondary | ICD-10-CM | POA: Diagnosis present

## 2018-12-29 DIAGNOSIS — E8809 Other disorders of plasma-protein metabolism, not elsewhere classified: Secondary | ICD-10-CM | POA: Diagnosis present

## 2018-12-29 DIAGNOSIS — Z1612 Extended spectrum beta lactamase (ESBL) resistance: Secondary | ICD-10-CM | POA: Diagnosis not present

## 2018-12-29 DIAGNOSIS — T451X5A Adverse effect of antineoplastic and immunosuppressive drugs, initial encounter: Secondary | ICD-10-CM | POA: Diagnosis present

## 2018-12-29 DIAGNOSIS — J9601 Acute respiratory failure with hypoxia: Secondary | ICD-10-CM | POA: Diagnosis not present

## 2018-12-29 DIAGNOSIS — D696 Thrombocytopenia, unspecified: Secondary | ICD-10-CM | POA: Diagnosis not present

## 2018-12-29 DIAGNOSIS — R1084 Generalized abdominal pain: Secondary | ICD-10-CM

## 2018-12-29 DIAGNOSIS — R000 Tachycardia, unspecified: Secondary | ICD-10-CM

## 2018-12-29 DIAGNOSIS — I469 Cardiac arrest, cause unspecified: Secondary | ICD-10-CM | POA: Diagnosis not present

## 2018-12-29 DIAGNOSIS — Z91013 Allergy to seafood: Secondary | ICD-10-CM

## 2018-12-29 DIAGNOSIS — C50312 Malignant neoplasm of lower-inner quadrant of left female breast: Principal | ICD-10-CM | POA: Diagnosis present

## 2018-12-29 DIAGNOSIS — J9811 Atelectasis: Secondary | ICD-10-CM | POA: Diagnosis not present

## 2018-12-29 DIAGNOSIS — R0902 Hypoxemia: Secondary | ICD-10-CM

## 2018-12-29 DIAGNOSIS — D72819 Decreased white blood cell count, unspecified: Secondary | ICD-10-CM

## 2018-12-29 DIAGNOSIS — R188 Other ascites: Secondary | ICD-10-CM | POA: Diagnosis not present

## 2018-12-29 DIAGNOSIS — K7689 Other specified diseases of liver: Secondary | ICD-10-CM | POA: Diagnosis not present

## 2018-12-29 DIAGNOSIS — Z9102 Food additives allergy status: Secondary | ICD-10-CM

## 2018-12-29 DIAGNOSIS — K219 Gastro-esophageal reflux disease without esophagitis: Secondary | ICD-10-CM | POA: Diagnosis present

## 2018-12-29 DIAGNOSIS — Z87891 Personal history of nicotine dependence: Secondary | ICD-10-CM | POA: Diagnosis not present

## 2018-12-29 DIAGNOSIS — I10 Essential (primary) hypertension: Secondary | ICD-10-CM | POA: Diagnosis present

## 2018-12-29 DIAGNOSIS — R109 Unspecified abdominal pain: Secondary | ICD-10-CM

## 2018-12-29 DIAGNOSIS — Z79899 Other long term (current) drug therapy: Secondary | ICD-10-CM

## 2018-12-29 DIAGNOSIS — G629 Polyneuropathy, unspecified: Secondary | ICD-10-CM | POA: Diagnosis present

## 2018-12-29 DIAGNOSIS — E872 Acidosis: Secondary | ICD-10-CM | POA: Diagnosis present

## 2018-12-29 DIAGNOSIS — R161 Splenomegaly, not elsewhere classified: Secondary | ICD-10-CM | POA: Diagnosis not present

## 2018-12-29 DIAGNOSIS — G588 Other specified mononeuropathies: Secondary | ICD-10-CM | POA: Diagnosis not present

## 2018-12-29 DIAGNOSIS — Z515 Encounter for palliative care: Secondary | ICD-10-CM | POA: Diagnosis present

## 2018-12-29 DIAGNOSIS — J45909 Unspecified asthma, uncomplicated: Secondary | ICD-10-CM | POA: Diagnosis present

## 2018-12-29 DIAGNOSIS — Z171 Estrogen receptor negative status [ER-]: Secondary | ICD-10-CM | POA: Diagnosis not present

## 2018-12-29 DIAGNOSIS — R001 Bradycardia, unspecified: Secondary | ICD-10-CM | POA: Diagnosis not present

## 2018-12-29 DIAGNOSIS — Z9221 Personal history of antineoplastic chemotherapy: Secondary | ICD-10-CM | POA: Diagnosis not present

## 2018-12-29 DIAGNOSIS — Z0184 Encounter for antibody response examination: Secondary | ICD-10-CM

## 2018-12-29 DIAGNOSIS — F419 Anxiety disorder, unspecified: Secondary | ICD-10-CM | POA: Diagnosis present

## 2018-12-29 LAB — LACTIC ACID, PLASMA
Lactic Acid, Venous: 2.8 mmol/L (ref 0.5–1.9)
Lactic Acid, Venous: 2.4 mmol/L (ref 0.5–1.9)

## 2018-12-29 LAB — URINALYSIS, COMPLETE (UACMP) WITH MICROSCOPIC
Bacteria, UA: NONE SEEN
Bilirubin Urine: NEGATIVE
Glucose, UA: NEGATIVE mg/dL
Hgb urine dipstick: NEGATIVE
Ketones, ur: NEGATIVE mg/dL
Leukocytes,Ua: NEGATIVE
Nitrite: NEGATIVE
Protein, ur: NEGATIVE mg/dL
Specific Gravity, Urine: 1.015 (ref 1.005–1.030)
pH: 6 (ref 5.0–8.0)

## 2018-12-29 LAB — COMPREHENSIVE METABOLIC PANEL
ALT: 85 U/L — ABNORMAL HIGH (ref 0–44)
AST: 133 U/L — ABNORMAL HIGH (ref 15–41)
Albumin: 2.7 g/dL — ABNORMAL LOW (ref 3.5–5.0)
Alkaline Phosphatase: 118 U/L (ref 38–126)
Anion gap: 15 (ref 5–15)
BUN: 17 mg/dL (ref 6–20)
CO2: 25 mmol/L (ref 22–32)
Calcium: 8.7 mg/dL — ABNORMAL LOW (ref 8.9–10.3)
Chloride: 93 mmol/L — ABNORMAL LOW (ref 98–111)
Creatinine, Ser: 0.56 mg/dL (ref 0.44–1.00)
GFR calc Af Amer: >60 mL/min (ref >60)
GFR calc non Af Amer: >60 mL/min (ref >60)
Glucose, Bld: 121 mg/dL — ABNORMAL HIGH (ref 70–99)
Potassium: 4.2 mmol/L (ref 3.5–5.1)
Sodium: 133 mmol/L — ABNORMAL LOW (ref 135–145)
Total Bilirubin: 1.1 mg/dL (ref 0.3–1.2)
Total Protein: 5.8 g/dL — ABNORMAL LOW (ref 6.5–8.1)

## 2018-12-29 LAB — TSH: TSH: 2.557 u[IU]/mL (ref 0.350–4.500)

## 2018-12-29 LAB — CBC
HCT: 34.8 % — ABNORMAL LOW (ref 36.0–46.0)
Hemoglobin: 12.2 g/dL (ref 12.0–15.0)
MCH: 31.8 pg (ref 26.0–34.0)
MCHC: 35.1 g/dL (ref 30.0–36.0)
MCV: 90.6 fL (ref 80.0–100.0)
Platelets: 40 10*3/uL — ABNORMAL LOW (ref 150–400)
RBC: 3.84 MIL/uL — ABNORMAL LOW (ref 3.87–5.11)
RDW: 17 % — ABNORMAL HIGH (ref 11.5–15.5)
WBC: 12.5 10*3/uL — ABNORMAL HIGH (ref 4.0–10.5)
nRBC: 0 % (ref 0.0–0.2)

## 2018-12-29 LAB — VITAMIN B12: Vitamin B-12: 876 pg/mL (ref 180–914)

## 2018-12-29 LAB — HEMOGLOBIN A1C
Hgb A1c MFr Bld: 5.2 % (ref 4.8–5.6)
Mean Plasma Glucose: 102.54 mg/dL

## 2018-12-29 LAB — GLUCOSE, CAPILLARY
Glucose-Capillary: 126 mg/dL — ABNORMAL HIGH (ref 70–99)
Glucose-Capillary: 157 mg/dL — ABNORMAL HIGH (ref 70–99)

## 2018-12-29 LAB — LIPASE, BLOOD: Lipase: 20 U/L (ref 11–51)

## 2018-12-29 LAB — SARS CORONAVIRUS 2 (TAT 6-24 HRS): SARS Coronavirus 2: NEGATIVE

## 2018-12-29 MED ORDER — SODIUM CHLORIDE 0.9 % IV SOLN
INTRAVENOUS | Status: DC | PRN
  Administered 2018-12-29 – 2019-01-02 (×3): 250 mL via INTRAVENOUS

## 2018-12-29 MED ORDER — ALPRAZOLAM 0.5 MG PO TABS
0.2500 mg | ORAL_TABLET | Freq: Two times a day (BID) | ORAL | Status: DC | PRN
  Administered 2018-12-30 – 2019-01-03 (×3): 0.5 mg via ORAL
  Filled 2018-12-29 (×4): qty 1

## 2018-12-29 MED ORDER — ONDANSETRON HCL 4 MG/2ML IJ SOLN
4.0000 mg | Freq: Four times a day (QID) | INTRAMUSCULAR | Status: DC | PRN
  Administered 2019-01-03 (×2): 4 mg via INTRAVENOUS

## 2018-12-29 MED ORDER — SODIUM CHLORIDE 0.9 % IV SOLN: 2.0000 g | Freq: Three times a day (TID) | INTRAVENOUS | Status: DC

## 2018-12-29 MED ORDER — FENTANYL CITRATE (PF) 100 MCG/2ML IJ SOLN
25.0000 ug | INTRAMUSCULAR | Status: DC | PRN
  Administered 2019-01-04: 25 ug via INTRAVENOUS
  Filled 2018-12-29: qty 2

## 2018-12-29 MED ORDER — OMEGA-3-ACID ETHYL ESTERS 1 G PO CAPS
1000.0000 mg | ORAL_CAPSULE | Freq: Every evening | ORAL | Status: DC
  Administered 2018-12-30 – 2019-01-03 (×4): 1000 mg via ORAL
  Filled 2018-12-29 (×6): qty 1

## 2018-12-29 MED ORDER — ONDANSETRON HCL 4 MG/2ML IJ SOLN
4.0000 mg | Freq: Once | INTRAMUSCULAR | Status: AC
  Administered 2018-12-29: 4 mg via INTRAVENOUS
  Filled 2018-12-29: qty 2

## 2018-12-29 MED ORDER — PIPERACILLIN-TAZOBACTAM 3.375 G IVPB
3.3750 g | Freq: Three times a day (TID) | INTRAVENOUS | Status: DC
  Administered 2018-12-29 – 2018-12-31 (×6): 3.375 g via INTRAVENOUS
  Filled 2018-12-29 (×7): qty 50

## 2018-12-29 MED ORDER — VITAMIN D3 25 MCG (1000 UNIT) PO TABS
2000.0000 [IU] | ORAL_TABLET | Freq: Every evening | ORAL | Status: DC
  Administered 2018-12-29 – 2019-01-03 (×5): 2000 [IU] via ORAL
  Filled 2018-12-29 (×12): qty 2

## 2018-12-29 MED ORDER — ADULT MULTIVITAMIN W/MINERALS CH
1.0000 | ORAL_TABLET | Freq: Every day | ORAL | Status: DC
  Administered 2018-12-30 – 2019-01-03 (×5): 1 via ORAL
  Filled 2018-12-29 (×5): qty 1

## 2018-12-29 MED ORDER — HYDROMORPHONE HCL 2 MG PO TABS
1.0000 mg | ORAL_TABLET | ORAL | Status: DC | PRN
  Filled 2018-12-29: qty 1

## 2018-12-29 MED ORDER — METRONIDAZOLE IN NACL 5-0.79 MG/ML-% IV SOLN
500.0000 mg | Freq: Once | INTRAVENOUS | Status: AC
  Administered 2018-12-29: 500 mg via INTRAVENOUS
  Filled 2018-12-29: qty 100

## 2018-12-29 MED ORDER — HYDROMORPHONE HCL 1 MG/ML IJ SOLN
1.0000 mg | Freq: Once | INTRAMUSCULAR | Status: AC
  Administered 2018-12-29: 1 mg via INTRAVENOUS
  Filled 2018-12-29: qty 1

## 2018-12-29 MED ORDER — SODIUM CHLORIDE 0.9% FLUSH
10.0000 mL | Freq: Every morning | INTRAVENOUS | Status: DC
  Administered 2018-12-30 – 2019-01-03 (×5): 10 mL via INTRAVENOUS

## 2018-12-29 MED ORDER — TRAMADOL HCL 50 MG PO TABS
50.0000 mg | ORAL_TABLET | Freq: Two times a day (BID) | ORAL | Status: DC | PRN
  Administered 2019-01-03 (×2): 50 mg via ORAL
  Filled 2018-12-29 (×2): qty 1

## 2018-12-29 MED ORDER — LOSARTAN POTASSIUM 50 MG PO TABS
50.0000 mg | ORAL_TABLET | Freq: Every day | ORAL | Status: DC
  Administered 2018-12-29 – 2019-01-03 (×6): 50 mg via ORAL
  Filled 2018-12-29 (×5): qty 1

## 2018-12-29 MED ORDER — ENSURE ENLIVE PO LIQD
237.0000 mL | Freq: Two times a day (BID) | ORAL | Status: DC
  Administered 2018-12-29 – 2018-12-31 (×2): 237 mL via ORAL

## 2018-12-29 MED ORDER — FENTANYL 25 MCG/HR TD PT72
1.0000 | MEDICATED_PATCH | TRANSDERMAL | Status: DC
  Administered 2018-12-29 – 2019-01-01 (×2): 1 via TRANSDERMAL
  Filled 2018-12-29 (×2): qty 1

## 2018-12-29 MED ORDER — FAMOTIDINE 20 MG PO TABS
20.0000 mg | ORAL_TABLET | Freq: Every day | ORAL | Status: DC | PRN
  Administered 2018-12-31: 20 mg via ORAL
  Filled 2018-12-29: qty 1

## 2018-12-29 MED ORDER — SODIUM CHLORIDE 0.9 % IV BOLUS
500.0000 mL | Freq: Once | INTRAVENOUS | Status: AC
  Administered 2018-12-29: 500 mL via INTRAVENOUS

## 2018-12-29 MED ORDER — INSULIN ASPART 100 UNIT/ML ~~LOC~~ SOLN
0.0000 [IU] | Freq: Three times a day (TID) | SUBCUTANEOUS | Status: DC
  Administered 2018-12-29 – 2018-12-31 (×2): 1 [IU] via SUBCUTANEOUS
  Administered 2018-12-31: 2 [IU] via SUBCUTANEOUS
  Administered 2018-12-31: 1 [IU] via SUBCUTANEOUS
  Administered 2019-01-01 (×2): 2 [IU] via SUBCUTANEOUS
  Administered 2019-01-02 (×2): 1 [IU] via SUBCUTANEOUS
  Administered 2019-01-03: 2 [IU] via SUBCUTANEOUS
  Administered 2019-01-03: 1 [IU] via SUBCUTANEOUS
  Administered 2019-01-03: 2 [IU] via SUBCUTANEOUS
  Filled 2018-12-29 (×11): qty 1

## 2018-12-29 MED ORDER — SODIUM CHLORIDE 0.9% FLUSH: 3.0000 mL | Freq: Once | INTRAVENOUS | Status: DC

## 2018-12-29 MED ORDER — SUCRALFATE 1 G PO TABS
1.0000 g | ORAL_TABLET | Freq: Three times a day (TID) | ORAL | Status: DC
  Administered 2018-12-29 – 2019-01-03 (×21): 1 g via ORAL
  Filled 2018-12-29 (×23): qty 1

## 2018-12-29 MED ORDER — PANTOPRAZOLE SODIUM 40 MG PO TBEC
40.0000 mg | DELAYED_RELEASE_TABLET | Freq: Every day | ORAL | Status: DC
  Administered 2018-12-29 – 2019-01-03 (×6): 40 mg via ORAL
  Filled 2018-12-29 (×5): qty 1

## 2018-12-29 MED ORDER — DEXAMETHASONE 4 MG PO TABS
8.0000 mg | ORAL_TABLET | Freq: Three times a day (TID) | ORAL | Status: DC
  Administered 2018-12-29 (×2): 4 mg via ORAL
  Administered 2018-12-30 – 2019-01-03 (×15): 8 mg via ORAL
  Filled 2018-12-29 (×23): qty 2

## 2018-12-29 MED ORDER — CHLORHEXIDINE GLUCONATE CLOTH 2 % EX PADS
6.0000 | MEDICATED_PAD | Freq: Every day | CUTANEOUS | Status: DC
  Administered 2018-12-30 – 2019-01-02 (×4): 6 via TOPICAL

## 2018-12-29 MED ORDER — HYDROMORPHONE HCL 1 MG/ML IJ SOLN
1.0000 mg | INTRAMUSCULAR | Status: AC | PRN
  Administered 2018-12-29 – 2019-01-04 (×2): 1 mg via INTRAVENOUS
  Filled 2018-12-29 (×2): qty 1

## 2018-12-29 MED ORDER — SODIUM CHLORIDE 0.9% FLUSH
10.0000 mL | INTRAVENOUS | Status: DC | PRN
  Administered 2018-12-29 (×2): 10 mL via INTRAVENOUS
  Filled 2018-12-29 (×2): qty 10

## 2018-12-29 MED ORDER — ONDANSETRON HCL 4 MG PO TABS
4.0000 mg | ORAL_TABLET | Freq: Four times a day (QID) | ORAL | Status: DC | PRN
  Administered 2019-01-01: 4 mg via ORAL
  Filled 2018-12-29: qty 1

## 2018-12-29 MED ORDER — BISACODYL 5 MG PO TBEC
5.0000 mg | DELAYED_RELEASE_TABLET | Freq: Every day | ORAL | Status: DC | PRN
  Administered 2018-12-29 – 2018-12-31 (×3): 5 mg via ORAL
  Filled 2018-12-29 (×4): qty 1

## 2018-12-29 MED ORDER — SODIUM CHLORIDE 0.9 % IV SOLN
2.0000 g | Freq: Once | INTRAVENOUS | Status: AC
  Administered 2018-12-29: 2 g via INTRAVENOUS
  Filled 2018-12-29: qty 2

## 2018-12-29 NOTE — ED Notes (Signed)
Pt taken to CT.

## 2018-12-29 NOTE — ED Notes (Signed)
Assessed pt needs at this time. Pt states she is warm, does not want to eat at this time. No other needs identified.

## 2018-12-29 NOTE — H&P (Signed)
History and Physical    Shelby [name] [id number] DOB: 06/05/1970 DOA: 12/06/2018  PCP: Jerrol Banana., MD Patient coming from: Home  Chief Complaint: Abdominal pain  HPI: Shelby [name] is a 48 y.o. female with medical history significant of triple negative status for metastatic breast cancer, HTN presented to the hospital with complaints of abdominal pain, distention and nausea.  Patient was admitted here few days ago and discharged on 11/27 .  After going home she continued report of mid abdominal bloating, fullness and pain with poor appetite.  This continued to progress therefore came back to the hospital may be subjective chills but denies any fevers and other complaints.  During her last admission she was evaluated possible underlying infection but after discussion between hospitalist, oncology and infectious disease it was determined that likely patient is having fever secondary to tumor burden.  She was started on steroids and discharged.  At that time attempt for pain control was also made and she did well.  In the ER today she was noted to have elevated WBC, lactic acidosis and abdominal pain with ascites.  CT of the abdomen pelvis showed moderate ascites with widespread metastatic disease to the liver.  Attempted paracentesis in the ER but due to malpositioning it was abandoned.  Oncology was consulted who recommended admission for pain control and sepsis.  She was started on broad-spectrum antibiotics.  She also reported to me of left upper extremity numbness without any neck pain.   Review of Systems: As per HPI otherwise 10 point review of systems negative.  Review of Systems Otherwise negative except as per HPI, including: General: Denies fever,  night sweats or unintended weight loss. Resp: Denies cough, wheezing, shortness of breath. Cardiac: Denies chest pain, palpitations, orthopnea, paroxysmal nocturnal dyspnea. GI: Denies abdominal pain, , diarrhea or  constipation GU: Denies dysuria, frequency, hesitancy or incontinence MS: Denies muscle aches, joint pain or swelling Neuro: Denies headache, neurologic deficits (focal weakness, numbness, tingling), abnormal gait Psych: Denies anxiety, depression, SI/HI/AVH Skin: Denies new rashes or lesions ID: Denies sick contacts, exotic exposures, travel  Past Medical History:  Diagnosis Date  . Anemia    h/o with pregnancy  . Anxiety   . Asthma    allergy induced-no inhaler  . Cancer [facility])    breast left   . Complication of anesthesia   . Environmental allergies   . Family history of adverse reaction to anesthesia    son-breathing problems-coded 1st time when he was 2 and had another surgery at 52 and had to be admitted for breathing problems  . Family history of breast cancer   . GERD (gastroesophageal reflux disease)   . Headache    migraines  . Hypertension   . Mitral valve disease   . Mitral valve prolapse   . Painful menstrual periods   . PONV (postoperative nausea and vomiting)   . Vertigo     Past Surgical History:  Procedure Laterality Date  . ABDOMINAL HYSTERECTOMY  2010   supracervical   . AXILLARY LYMPH NODE DISSECTION Left 07/12/2018   Procedure: AXILLARY LYMPH NODE DISSECTION;  Surgeon: Robert Bellow, MD;  Location: [facility] ORS;  Service: General;  Laterality: Left;  . BREAST BIOPSY Left 11/2017   invasive ductal carcinoma and metastatic LN  . BREAST BIOPSY WITH SENTINEL LYMPH NODE BIOPSY AND NEEDLE LOCALIZATION Left 07/12/2018   Procedure: BREAST BIOPSY WIDE EXCISION WITH SENTINEL NODE  AND NEEDLE LOCALIZATION OF OXILLARY NODS LEFT;  Surgeon: Bary Castilla,  Forest Gleason, MD;  Location: [facility] ORS;  Service: General;  Laterality: Left;  . BUNIONECTOMY Bilateral   . MANDIBLE RECONSTRUCTION     age 32-underbite  . PORTACATH PLACEMENT Right 12/31/2017   Procedure: INSERTION PORT-A-CATH;  Surgeon: Robert Bellow, MD;  Location: [facility] ORS;  Service: General;  Laterality: Right;  .  RE-EXCISION OF BREAST LUMPECTOMY Left 07/24/2018   Procedure: RE-EXCISION OF BREAST LUMPECTOMY LEFT;  Surgeon: Robert Bellow, MD;  Location: [facility] ORS;  Service: General;  Laterality: Left;  . SHOULDER ARTHROSCOPY WITH OPEN ROTATOR CUFF REPAIR Right 04/26/2017   Procedure: SHOULDER ARTHROSCOPY WITH OPEN ROTATOR CUFF REPAIR,SUBACROMINAL DECOMPRESSION;  Surgeon: Thornton Park, MD;  Location: [facility] ORS;  Service: Orthopedics;  Laterality: Right;  . TONSILLECTOMY      SOCIAL HISTORY:  reports that she quit smoking about 11 years ago. Her smoking use included cigarettes. She has a 5.00 pack-year smoking history. She has never used smokeless tobacco. She reports current alcohol use. She reports that she does not use drugs.  Allergies  Allergen Reactions  . Hydrocodone Hives    Swollen face - many years ago. Thinks she has tolerated since.  . Iodine Hives  . Peanut Butter Flavor Other (See Comments)    Made mouth tingle  . Shellfish Allergy Hives  . Vancomycin Other (See Comments)    Itching and forehead turned red    FAMILY HISTORY: Family History  Problem Relation Age of Onset  . Breast cancer Mother 39  . Diabetes Father   . Cancer Maternal Uncle        colon  . Breast cancer Maternal Grandmother 70     Prior to Admission medications   Medication Sig Start Date End Date Taking? Authorizing Provider  ALPRAZolam Duanne Moron) 0.5 MG tablet Take 0.5-1 tablets (0.25-0.5 mg total) by mouth 2 (two) times daily as needed for anxiety. Patient not taking: Reported on 11/22/2018 06/11/18   Verlon Au, NP  bisacodyl (DULCOLAX) 5 MG EC tablet Take 1 tablet (5 mg total) by mouth daily as needed for moderate constipation. 12/27/18 12/27/19  Marcell Anger, MD  Cholecalciferol (VITAMIN D) 50 MCG (2000 UT) CAPS Take 2,000 Units by mouth every evening.     [provider]  dexamethasone (DECADRON) 4 MG tablet Take 2 tablets (8 mg total) by mouth every 8 (eight) hours for 28  days. 12/27/18 01/24/19  Marcell Anger, MD  docusate sodium (COLACE) 100 MG capsule Take 1 capsule (100 mg total) by mouth 2 (two) times daily. 12/27/18 12/27/19  Marcell Anger, MD  famotidine (PEPCID) 20 MG tablet Take 20 mg by mouth daily as needed for heartburn.     [provider]  HYDROmorphone (DILAUDID) 2 MG tablet Take 0.5 tablets (1 mg total) by mouth every 4 (four) hours as needed for up to 14 days for severe pain (cancer associated pain). 12/27/18 01/10/19  Marcell Anger, MD  ibuprofen (ADVIL,MOTRIN) 200 MG tablet Take 200 mg by mouth daily as needed for mild pain.     [provider]  losartan (COZAAR) 50 MG tablet TAKE 1 TABLET BY MOUTH DAILY Patient taking differently: Take 50 mg by mouth daily.  11/16/18   Jerrol Banana., MD  mometasone (NASONEX) 50 MCG/ACT nasal spray Place 2 sprays into the nose daily. Patient taking differently: Place 2 sprays into the nose daily as needed (allergies).  04/18/18   Lloyd Huger, MD  montelukast (SINGULAIR) 10 MG tablet TAKE 1 TABLET BY  MOUTH AT BEDTIME Patient taking differently: Take 10 mg by mouth daily as needed (allergies).  04/17/18   Jerrol Banana., MD  Multiple Vitamin (MULTIVITAMIN WITH MINERALS) TABS tablet Take 1 tablet by mouth daily. 12/21/18   Fritzi Mandes, MD  Omega-3 1000 MG CAPS Take 1,000 mg by mouth every evening.     [provider]  omeprazole (PRILOSEC) 20 MG capsule Take 1 capsule (20 mg total) by mouth daily. 12/17/18   Jerrol Banana., MD  sucralfate (CARAFATE) 1 g tablet Take 1 tablet (1 g total) by mouth 4 (four) times daily -  with meals and at bedtime. 12/17/18   Jerrol Banana., MD  traMADol (ULTRAM) 50 MG tablet Take 1 tablet (50 mg total) by mouth every 12 (twelve) hours as needed for severe pain. 12/20/18   Fritzi Mandes, MD  prochlorperazine (COMPAZINE) 10 MG tablet Take 1 tablet (10 mg total) by mouth every 6 (six) hours as  needed (Nausea or vomiting). 12/24/17 10/09/18  Lloyd Huger, MD    Physical Exam: Vitals:   12/02/2018 1030 12/18/2018 1100 12/15/2018 1130 12/04/2018 1210  BP: (!) 138/92 (!) 145/91 (!) 153/91 (!) 148/89  Pulse: (!) 55 79 85 61  Resp: 15 14 15    Temp:      TempSrc:      SpO2: 96% 95% 93% 98%  Weight:      Height:          Constitutional: Frail-appearing with bilateral temporal wasting Eyes: PERRL, lids and conjunctivae normal ENMT: Mucous membranes are moist. Posterior pharynx clear of any exudate or lesions.Normal dentition.  Neck: normal, supple, no masses, no thyromegaly Respiratory: clear to auscultation bilaterally, no wheezing, no crackles. Normal respiratory effort. No accessory muscle use.  Cardiovascular: Regular rate and rhythm, no murmurs / rubs / gallops. No extremity edema. 2+ pedal pulses. No carotid bruits.  Abdomen: Tender to deep palpation, distended, fluid wave shift.  Positive bowel sounds. Musculoskeletal: no clubbing / cyanosis. No joint deformity upper and lower extremities. Good ROM, no contractures. Normal muscle tone.  Skin: no rashes, lesions, ulcers. No induration Neurologic: CN 2-12 grossly intact. Sensation intact, DTR normal. Strength 5/5 in all 4.  Psychiatric: Normal judgment and insight. Alert and oriented x 3. Normal mood.     Labs on Admission: I have personally reviewed following labs and imaging studies  CBC: Recent Labs  Lab 12/23/18 0414 12/23/18 2102 12/25/18 0400 12/12/2018 0919  WBC 5.4 3.9* 4.6 12.5*  NEUTROABS  --  3.0  --   --   HGB 10.2* 10.3* 9.0* 12.2  HCT 29.8* 28.5* 26.5* 34.8*  MCV 95.5 91.3 94.0 90.6  PLT 45* 38* 49* 40*   Basic Metabolic Panel: Recent Labs  Lab 12/23/18 0414 12/23/18 2102 12/25/18 0400 12/30/2018 0919  NA 130* 131* 134* 133*  K 4.2 3.6 3.4* 4.2  CL 95* 94* 96* 93*  CO2 24 25 27 25   GLUCOSE 131* 165* 110* 121*  BUN 10 10 9 17   CREATININE 0.49 0.63 0.46 0.56  CALCIUM 8.2* 8.2* 8.2* 8.7*    GFR: Estimated Creatinine Clearance: 78.8 mL/min (by C-G formula based on SCr of 0.56 mg/dL). Liver Function Tests: Recent Labs  Lab 12/23/18 0414 12/23/18 2102 12/13/2018 0919  AST 223* 199* 133*  ALT 138* 124* 85*  ALKPHOS 98 101 118  BILITOT 0.9 1.0 1.1  PROT 5.6* 5.5* 5.8*  ALBUMIN 2.8* 2.6* 2.7*   Recent Labs  Lab 12/20/2018 0919  LIPASE  20   No results for input(s): AMMONIA in the last 168 hours. Coagulation Profile: No results for input(s): INR, PROTIME in the last 168 hours. Cardiac Enzymes: No results for input(s): CKTOTAL, CKMB, CKMBINDEX, TROPONINI in the last 168 hours. BNP (last 3 results) No results for input(s): PROBNP in the last 8760 hours. HbA1C: No results for input(s): HGBA1C in the last 72 hours. CBG: No results for input(s): GLUCAP in the last 168 hours. Lipid Profile: No results for input(s): CHOL, HDL, LDLCALC, TRIG, CHOLHDL, LDLDIRECT in the last 72 hours. Thyroid Function Tests: No results for input(s): TSH, T4TOTAL, FREET4, T3FREE, THYROIDAB in the last 72 hours. Anemia Panel: No results for input(s): VITAMINB12, FOLATE, FERRITIN, TIBC, IRON, RETICCTPCT in the last 72 hours. Urine analysis:    Component Value Date/Time   COLORURINE YELLOW (A) 12/21/2018 1212   APPEARANCEUR CLEAR (A) 12/21/2018 1212   LABSPEC 1.012 12/21/2018 1212   PHURINE 6.0 12/21/2018 1212   GLUCOSEU NEGATIVE 12/21/2018 1212   HGBUR SMALL (A) 12/21/2018 1212   BILIRUBINUR NEGATIVE 12/21/2018 1212   BILIRUBINUR neg 05/16/2016 1537   KETONESUR NEGATIVE 12/21/2018 1212   PROTEINUR NEGATIVE 12/21/2018 1212   UROBILINOGEN 0.2 05/16/2016 1537   NITRITE NEGATIVE 12/21/2018 1212   LEUKOCYTESUR NEGATIVE 12/21/2018 1212   Sepsis Labs: !!!!!!!!!!!!!!!!!!!!!!!!!!!!!!!!!!!!!!!!!!!! @LABRCNTIP (procalcitonin:4,lacticidven:4) ) Recent Results (from the past 240 hour(s))  SARS CORONAVIRUS 2 (TAT 6-24 HRS) Nasopharyngeal Nasopharyngeal Swab     Status: None   Collection Time:  12/21/18  4:24 PM   Specimen: Nasopharyngeal Swab  Result Value Ref Range Status   SARS Coronavirus 2 NEGATIVE NEGATIVE Final    Comment: (NOTE) SARS-CoV-2 target nucleic acids are NOT DETECTED. The SARS-CoV-2 RNA is generally detectable in upper and lower respiratory specimens during the acute phase of infection. Negative results do not preclude SARS-CoV-2 infection, do not rule out co-infections with other pathogens, and should not be used as the sole basis for treatment or other patient management decisions. Negative results must be combined with clinical observations, patient history, and epidemiological information. The expected result is Negative. Fact Sheet for Patients: SugarRoll.be Fact Sheet for Healthcare Providers: https://www.woods-mathews.com/ This test is not yet approved or cleared by the [place] FDA and  has been authorized for detection and/or diagnosis of SARS-CoV-2 by FDA under an Emergency Use Authorization (EUA). This EUA will remain  in effect (meaning this test can be used) for the duration of the COVID-19 declaration under Section 56 4(b)(1) of the Act, 21 U.S.C. section 360bbb-3(b)(1), unless the authorization is terminated or revoked sooner. Performed at [facility], [place] [address] [place], [place] [address]   CULTURE, BLOOD (ROUTINE X 2) w Reflex to ID Panel     Status: None   Collection Time: 12/21/18  4:24 PM   Specimen: BLOOD  Result Value Ref Range Status   Specimen Description BLOOD R WRIST  Final   Special Requests   Final    BOTTLES DRAWN AEROBIC AND ANAEROBIC Blood Culture adequate volume   Culture   Final    NO GROWTH 5 DAYS Performed at [facility], [address] [place], [place] [address]    Report Status 12/26/2018 FINAL  Final  CULTURE, BLOOD (ROUTINE X 2) w Reflex to ID Panel     Status: None   Collection Time: 12/21/18  4:24 PM   Specimen: BLOOD  Result Value Ref  Range Status   Specimen Description BLOOD RAC  Final   Special Requests   Final  BOTTLES DRAWN AEROBIC AND ANAEROBIC Blood Culture adequate volume   Culture   Final    NO GROWTH 5 DAYS Performed at [facility], [address] [place], [place] [address]    Report Status 12/26/2018 FINAL  Final  CULTURE, BLOOD (ROUTINE X 2) w Reflex to ID Panel     Status: None   Collection Time: 12/23/18 10:04 AM   Specimen: BLOOD  Result Value Ref Range Status   Specimen Description BLOOD BLOOD RIGHT HAND  Final   Special Requests   Final    BOTTLES DRAWN AEROBIC AND ANAEROBIC Blood Culture adequate volume   Culture   Final    NO GROWTH 5 DAYS Performed at [facility], [address] [place], [place] [address]    Report Status 12/28/2018 FINAL  Final  CULTURE, BLOOD (ROUTINE X 2) w Reflex to ID Panel     Status: None   Collection Time: 12/23/18 10:11 AM   Specimen: BLOOD  Result Value Ref Range Status   Specimen Description BLOOD BLOOD RIGHT HAND  Final   Special Requests   Final    BOTTLES DRAWN AEROBIC AND ANAEROBIC Blood Culture results may not be optimal due to an excessive volume of blood received in culture bottles   Culture   Final    NO GROWTH 5 DAYS Performed at [facility], [address] [place], [place] [address]    Report Status 12/28/2018 FINAL  Final     Radiological Exams on Admission: Ct Abdomen Pelvis Wo Contrast  Addendum Date: 12/16/2018   ADDENDUM REPORT: 12/11/2018 10:16 ADDENDUM: Urinary bladder wall mildly thickened. A degree of cystitis questioned. Electronically Signed   By: Lowella Grip III M.D.   On: 12/13/2018 10:16   Result Date: 12/21/2018 CLINICAL DATA:  Abdominal pain with nausea and distention. Breast carcinoma with known liver metastases EXAM: CT ABDOMEN AND PELVIS WITHOUT CONTRAST TECHNIQUE: Multidetector CT imaging of the abdomen and pelvis was performed following the standard protocol without oral or IV  contrast. COMPARISON:  December 21, 2018 FINDINGS: Lower chest: There is a right pleural effusion with atelectatic change in the right base. There is atelectatic change in the left base with rather minimal left pleural effusion. Small pericardial effusion noted. Hepatobiliary: There is widespread metastatic disease is seen throughout the liver, better delineated on recent contrast-enhanced study. Gallbladder is largely collapsed. No biliary duct dilatation evident. Pancreas: No pancreatic mass or inflammatory focus. Spleen: No splenic lesions are evident. Adrenals/Urinary Tract: Adrenals appear unremarkable bilaterally. Kidneys bilaterally show no evident mass or hydronephrosis on either side. There is no evident renal or ureteral calculus on either side. Urinary bladder is midline. The urinary bladder wall thickness is mildly increased. Stomach/Bowel: There is moderate stool in the colon. There is no appreciable bowel wall or mesenteric thickening. No evident bowel obstruction. Terminal ileal region appears unremarkable. There is no free air or portal venous air. Vascular/Lymphatic: No abdominal aortic aneurysm. No arterial lesions seen on this noncontrast enhanced study. Multiple upper abdominal venous collaterals are again noted without change from recent study. A focal periceliac lymph node measuring 1.6 x 1.3 cm is stable. No new adenopathy evident compared to recent study. Reproductive: Uterus absent.  No pelvic mass evident. Other: There is moderate ascites, slightly increased compared to recent study. No abscess evident in the abdomen or pelvis. No periappendiceal region inflammation. Appendix not appreciable. Musculoskeletal: No lytic or destructive bone lesions are evident. A small sclerotic focus in the right sacrum may  represent a small bone island. No intramuscular lesions are evident. IMPRESSION: 1.  Moderate ascites, slightly increased compared to recent study. 2.  Widespread hepatic metastatic  disease. 3. No bowel obstruction. No abscess in the abdomen or pelvis. No periappendiceal region inflammation. 4.  Enlarged periceliac lymph node, suspected due to neoplasm. 5. Venous collaterals in the upper abdomen, primarily on the left, stable. Further assessment with respect to etiology not possible without intravenous contrast. Appearance stable compared to recent contrast enhanced CT examination. 6.  Uterus absent. 7. Small pleural effusions with bibasilar atelectatic change. Small pericardial effusion. Electronically Signed: By: Lowella Grip III M.D. On: 12/24/2018 10:13     All images have been reviewed by me personally.    Assessment/Plan Principal Problem:   Abdominal pain Active Problems:   Hypertension   Malignant neoplasm of female breast (HCC)   Pancytopenia (HCC)   Transaminitis   Liver mass   GERD (gastroesophageal reflux disease)   Sepsis (HCC)   Ascites, malignant    Sepsis, suspect intra-abdominal infection-spontaneous bacterial peritonitis Moderate ascites, malignant -Admit to the hospital.  Elevated WBC, lactic acid.  Cultures drawn.  Sepsis protocol.  Elevated white count could also be from steroid use. -IV Zosyn, supportive care -Hydration with caution -IR consult for paracentesis-therapeutic if possible otherwise diagnostic. -Pain control, bowel regimen  Peripheral neuropathy, left upper extremity -Unsure if this is side effect from chemotherapy versus cervical anatomic pathology versus vitamin deficiency from poor nutrition -We will check TSH, B12 and folate. -Can pursue MRI cervical spine on nonemergent basis.  Metastatic breast cancer, triple negative stage IV Intermittent fever, tumor related -Follows outpatient oncology.  Plans for palliative chemotherapy.  Appreciate input by oncology.  Recent admission started on dexamethasone 3 times daily.  For now continue this.  Insulin sliding scale and Accu-Chek.  Anemia of chronic disease Hemoglobin  12.2 stable Thrombocytopenia, platelet 40 -No obvious signs of bleeding.  Platelets similar to previous admission, will continue to monitor.  Transaminitis -Suspect from metastatic disease.  Improved from previous admission.  Continue to monitor.  Essential hypertension -Losartan  Anxiety -Xanax.  DVT prophylaxis: SCDs, thrombocytopenia Code Status: Full code Family Communication: None Disposition Plan: To be determined Consults called: Oncology Admission status: Inpatient admission to [facility] for sepsis   Time Spent: 65 minutes.  >50% of the time was devoted to discussing the patients care, assessment, plan and disposition with other care givers along with counseling the patient about the risks and benefits of treatment.    Sheddrick Lattanzio Arsenio Loader MD [facility]  If 7PM-7AM, please contact night-coverage   12/04/2018, 12:21 PM

## 2018-12-29 NOTE — ED Provider Notes (Signed)
[facility] Emergency Department Provider Note  ____________________________________________   First MD Initiated Contact with Patient 12/14/2018 [phone]     (approximate)  I have reviewed the triage vital signs and the nursing notes.   HISTORY  Chief Complaint Abdominal Pain, Nausea, and Abdominal Distention    HPI Shelby [name] is a 48 y.o. female with breast cancer on chemotherapy with metastatic disease to her liver with very mild ascites who has had multiple recent admissions.  Patient was admitted in December 18, 2018 for sepsis and found to have metastatic liver disease.  She was then readmitted on 11/21 until 11/27 pain abdominal pain and shortness of breath.  Had a CT PE and CT abdomen done after contrast prep they are otherwise negative.  It was felt that her fevers and infectious markers were more likely elevated from her cancer than a true infection.  Patient was followed by infectious disease.  Patient was placed on Decadron with plan to follow-up with Dr. Grayland Ormond.  Patient was discharged on Dilaudid due to not tolerating oxycodone well.    Patient reports she last had her chemotherapy 3 weeks ago and is currently taking the p.o. Decadron.  She presents today for worsening abdominal pain.  Patient states that pain is severe, constant, worse with trying to eat, somewhat better with the oral Dilaudid but not relieved.  She says that her.  It is so severe that she does not think she can stay at home until she follows up for her chemotherapy in 3 days.  She says that she is got bilateral flank pain associated with it.  She says that she is afraid to eat due to feeling like she is getting more bloated.  She states that she has not been able to have a full bowel movement and not passing gas.  She also has some increased swelling in her legs.   Past Medical History:  Diagnosis Date   Anemia    h/o with pregnancy   Anxiety    Asthma    allergy induced-no  inhaler   Cancer ([place])    breast left    Complication of anesthesia    Environmental allergies    Family history of adverse reaction to anesthesia    son-breathing problems-coded 1st time when he was 2 and had another surgery at 11 and had to be admitted for breathing problems   Family history of breast cancer    GERD (gastroesophageal reflux disease)    Headache    migraines   Hypertension    Mitral valve disease    Mitral valve prolapse    Painful menstrual periods    PONV (postoperative nausea and vomiting)    Vertigo     Patient Active Problem List   Diagnosis Date Noted   Palliative care encounter    GERD (gastroesophageal reflux disease)    Anemia    Liver mass    Transaminitis    SIRS (systemic inflammatory response syndrome) (HCC)    Abdominal pain, acute, right upper quadrant 12/18/2018   Pancytopenia ([place]) 12/18/2018   Drug-induced neutropenia ([place]) 03/21/2018   Malignant neoplasm of female breast ([place]) 12/23/2017   Bronchitis 03/10/2015   Palpitations 02/15/2015   Anxiety 01/20/2015   Clinical depression 01/20/2015   Diverticulitis 01/20/2015   Hypertension 01/20/2015   Adaptive colitis 01/20/2015   Cannot sleep 01/20/2015   Headache, variant migraine 01/20/2015   Disorder of mitral valve 01/20/2015   Herpes zona 01/20/2015   Fibroid 05/07/2007  Allergic rhinitis 10/29/2006    Past Surgical History:  Procedure Laterality Date   ABDOMINAL HYSTERECTOMY  2010   supracervical    AXILLARY LYMPH NODE DISSECTION Left 07/12/2018   Procedure: AXILLARY LYMPH NODE DISSECTION;  Surgeon: Robert Bellow, MD;  Location: [facility] ORS;  Service: General;  Laterality: Left;   BREAST BIOPSY Left 11/2017   invasive ductal carcinoma and metastatic LN   BREAST BIOPSY WITH SENTINEL LYMPH NODE BIOPSY AND NEEDLE LOCALIZATION Left 07/12/2018   Procedure: BREAST BIOPSY WIDE EXCISION WITH SENTINEL NODE  AND NEEDLE LOCALIZATION OF Stormy Fabian  NODS LEFT;  Surgeon: Robert Bellow, MD;  Location: [facility];  Service: General;  Laterality: Left;   BUNIONECTOMY Bilateral    MANDIBLE RECONSTRUCTION     age 64-underbite   PORTACATH PLACEMENT Right 12/31/2017   Procedure: INSERTION PORT-A-CATH;  Surgeon: Robert Bellow, MD;  Location: [facility] ORS;  Service: General;  Laterality: Right;   RE-EXCISION OF BREAST LUMPECTOMY Left 07/24/2018   Procedure: RE-EXCISION OF BREAST LUMPECTOMY LEFT;  Surgeon: Robert Bellow, MD;  Location: [facility] OR[facility];  Service: General;  Laterality: Left;   SHOULDER ARTHROSCOPY WITH OPEN ROTATOR CUFF REPAIR Right 04/26/2017   Procedure: SHOULDER ARTHROSCOPY WITH OPEN ROTATOR CUFF REPAIR,SUBACROMINAL DECOMPRESSION;  Surgeon: Thornton Park, MD;  Location: [facility] OR[facility];  Service: Orthopedics;  Laterality: Right;   TONSILLECTOMY      Prior to Admission medications   Medication Sig Start Date End Date Taking? Authorizing Provider  ALPRAZolam Duanne Moron) 0.5 MG tablet Take 0.5-1 tablets (0.25-0.5 mg total) by mouth 2 (two) times daily as needed for anxiety. Patient not taking: Reported on 11/22/2018 06/11/18   Verlon Au, NP  bisacodyl (DULCOLAX) 5 MG EC tablet Take 1 tablet (5 mg total) by mouth daily as needed for moderate constipation. 12/27/18 12/27/19  Marcell Anger, MD  Cholecalciferol (VITAMIN D) 50 MCG (2000 UT) CAPS Take 2,000 Units by mouth every evening.     [provider]  dexamethasone (DECADRON) 4 MG tablet Take 2 tablets (8 mg total) by mouth every 8 (eight) hours for 28 days. 12/27/18 01/24/19  Marcell Anger, MD  docusate sodium (COLACE) 100 MG capsule Take 1 capsule (100 mg total) by mouth 2 (two) times daily. 12/27/18 12/27/19  Marcell Anger, MD  famotidine (PEPCID) 20 MG tablet Take 20 mg by mouth daily as needed for heartburn.     [provider]  HYDROmorphone (DILAUDID) 2 MG tablet Take 0.5 tablets (1 mg total) by mouth every 4 (four) hours as  needed for up to 14 days for severe pain (cancer associated pain). 12/27/18 01/10/19  Marcell Anger, MD  ibuprofen (ADVIL,MOTRIN) 200 MG tablet Take 200 mg by mouth daily as needed for mild pain.     [provider]  losartan (COZAAR) 50 MG tablet TAKE 1 TABLET BY MOUTH DAILY Patient taking differently: Take 50 mg by mouth daily.  11/16/18   Jerrol Banana., MD  mometasone (NASONEX) 50 MCG/ACT nasal spray Place 2 sprays into the nose daily. Patient taking differently: Place 2 sprays into the nose daily as needed (allergies).  04/18/18   Lloyd Huger, MD  montelukast (SINGULAIR) 10 MG tablet TAKE 1 TABLET BY MOUTH AT BEDTIME Patient taking differently: Take 10 mg by mouth daily as needed (allergies).  04/17/18   Jerrol Banana., MD  Multiple Vitamin (MULTIVITAMIN WITH MINERALS) TABS tablet Take 1 tablet by mouth daily. 12/21/18   Fritzi Mandes, MD  Omega-3 1000 MG  CAPS Take 1,000 mg by mouth every evening.     [provider]  omeprazole (PRILOSEC) 20 MG capsule Take 1 capsule (20 mg total) by mouth daily. 12/17/18   Jerrol Banana., MD  sucralfate (CARAFATE) 1 g tablet Take 1 tablet (1 g total) by mouth 4 (four) times daily -  with meals and at bedtime. 12/17/18   Jerrol Banana., MD  traMADol (ULTRAM) 50 MG tablet Take 1 tablet (50 mg total) by mouth every 12 (twelve) hours as needed for severe pain. 12/20/18   Fritzi Mandes, MD  prochlorperazine (COMPAZINE) 10 MG tablet Take 1 tablet (10 mg total) by mouth every 6 (six) hours as needed (Nausea or vomiting). 12/24/17 10/09/18  Lloyd Huger, MD    Allergies Hydrocodone, Iodine, Peanut butter flavor, Shellfish allergy, and Vancomycin  Family History  Problem Relation Age of Onset   Breast cancer Mother 41   Diabetes Father    Cancer Maternal Uncle        colon   Breast cancer Maternal Grandmother 70    Social History Social History   Tobacco Use   Smoking status:  Former Smoker    Packs/day: 0.50    Years: 10.00    Pack years: 5.00    Types: Cigarettes    Quit date: 04/20/2007    Years since quitting: 11.7   Smokeless tobacco: Never Used   Tobacco comment: social smoker back then  Substance Use Topics   Alcohol use: Yes    Comment: occas   Drug use: No      Review of Systems Constitutional: No fever/chills Eyes: No visual changes. ENT: No sore throat. Cardiovascular: Denies chest pain. Respiratory: Denies shortness of breath. Gastrointestinal: Positive abdominal pain, nausea, vomiting, decrease in bowel movements. Genitourinary: Negative for dysuria. Musculoskeletal: Negative for back pain.  Swelling of the legs Skin: Negative for rash. Neurological: Negative for headaches, focal weakness or numbness. All other ROS negative ____________________________________________   PHYSICAL EXAM:  VITAL SIGNS: ED Triage Vitals  Enc Vitals Group     BP 12/11/2018 0854 (!) 155/97     Pulse Rate 12/03/2018 0854 97     Resp 12/06/2018 0854 (!) 22     Temp 12/11/2018 0854 97.7 F (36.5 C)     Temp Source 12/25/2018 0854 Oral     SpO2 12/15/2018 0854 95 %     Weight 12/24/2018 0855 143 lb (64.9 kg)     Height 12/07/2018 0855 _0  (1.676 m)     Head Circumference --      Peak Flow --      Pain Score 12/22/2018 0855 9     Pain Loc --      Pain Edu? --      Excl. in [place]? --     Constitutional: Alert and oriented. Well appearing and in no acute distress. Eyes: Conjunctivae are normal. EOMI. Head: Atraumatic. Nose: No congestion/rhinnorhea. Mouth/Throat: Mucous membranes are moist.   Neck: No stridor. Trachea Midline. FROM Cardiovascular: Normal rate, regular rhythm. Grossly normal heart sounds.  Good peripheral circulation. Respiratory: Normal respiratory effort.  No retractions. Lungs CTAB. Gastrointestinal: Lower abdominal tenderness, no rebound, no guarding. Musculoskeletal: 1+ edema.  No joint effusions. Neurologic:  Normal speech and language.  No gross focal neurologic deficits are appreciated.  Skin:  Skin is warm, dry and intact. No rash noted. Psychiatric: Mood and affect are normal. Speech and behavior are normal. GU: Deferred  Back: No CT L-spine tenderness.  More  like flank tenderness with palpation. ____________________________________________   LABS (all labs ordered are listed, but only abnormal results are displayed)  Labs Reviewed  COMPREHENSIVE METABOLIC PANEL - Abnormal; Notable for the following components:      Result Value   Sodium 133 (*)    Chloride 93 (*)    Glucose, Bld 121 (*)    Calcium 8.7 (*)    Total Protein 5.8 (*)    Albumin 2.7 (*)    AST 133 (*)    ALT 85 (*)    All other components within normal limits  CBC - Abnormal; Notable for the following components:   WBC 12.5 (*)    RBC 3.84 (*)    HCT 34.8 (*)    RDW 17.0 (*)    Platelets 40 (*)    All other components within normal limits  LACTIC ACID, PLASMA - Abnormal; Notable for the following components:   Lactic Acid, Venous 2.8 (*)    All other components within normal limits  CULTURE, BLOOD (ROUTINE X 2)  CULTURE, BLOOD (ROUTINE X 2)  URINE CULTURE  LIPASE, BLOOD  URINALYSIS, COMPLETE (UACMP) WITH MICROSCOPIC  LACTIC ACID, PLASMA   ____________________________________________   ED ECG REPORT I, [name] Beth[place], the attending physician, personally viewed and interpreted this ECG.  EKG is sinus rate of 72, no ST elevation, no T wave inversions, normal intervals ____________________________________________  RADIOLOGY   Official radiology report(s): Ct Abdomen Pelvis Wo Contrast  Addendum Date: 12/19/2018   ADDENDUM REPORT: 12/08/2018 10:16 ADDENDUM: Urinary bladder wall mildly thickened. A degree of cystitis questioned. Electronically Signed   By: Lowella Grip III M.D.   On: 12/06/2018 10:16   Result Date: 12/11/2018 CLINICAL DATA:  Abdominal pain with nausea and distention. Breast carcinoma with known liver metastases  EXAM: CT ABDOMEN AND PELVIS WITHOUT CONTRAST TECHNIQUE: Multidetector CT imaging of the abdomen and pelvis was performed following the standard protocol without oral or IV contrast. COMPARISON:  December 21, 2018 FINDINGS: Lower chest: There is a right pleural effusion with atelectatic change in the right base. There is atelectatic change in the left base with rather minimal left pleural effusion. Small pericardial effusion noted. Hepatobiliary: There is widespread metastatic disease is seen throughout the liver, better delineated on recent contrast-enhanced study. Gallbladder is largely collapsed. No biliary duct dilatation evident. Pancreas: No pancreatic mass or inflammatory focus. Spleen: No splenic lesions are evident. Adrenals/Urinary Tract: Adrenals appear unremarkable bilaterally. Kidneys bilaterally show no evident mass or hydronephrosis on either side. There is no evident renal or ureteral calculus on either side. Urinary bladder is midline. The urinary bladder wall thickness is mildly increased. Stomach/Bowel: There is moderate stool in the colon. There is no appreciable bowel wall or mesenteric thickening. No evident bowel obstruction. Terminal ileal region appears unremarkable. There is no free air or portal venous air. Vascular/Lymphatic: No abdominal aortic aneurysm. No arterial lesions seen on this noncontrast enhanced study. Multiple upper abdominal venous collaterals are again noted without change from recent study. A focal periceliac lymph node measuring 1.6 x 1.3 cm is stable. No new adenopathy evident compared to recent study. Reproductive: Uterus absent.  No pelvic mass evident. Other: There is moderate ascites, slightly increased compared to recent study. No abscess evident in the abdomen or pelvis. No periappendiceal region inflammation. Appendix not appreciable. Musculoskeletal: No lytic or destructive bone lesions are evident. A small sclerotic focus in the right sacrum may represent a  small bone island. No intramuscular lesions are evident. IMPRESSION: 1.  Moderate ascites, slightly increased compared to recent study. 2.  Widespread hepatic metastatic disease. 3. No bowel obstruction. No abscess in the abdomen or pelvis. No periappendiceal region inflammation. 4.  Enlarged periceliac lymph node, suspected due to neoplasm. 5. Venous collaterals in the upper abdomen, primarily on the left, stable. Further assessment with respect to etiology not possible without intravenous contrast. Appearance stable compared to recent contrast enhanced CT examination. 6.  Uterus absent. 7. Small pleural effusions with bibasilar atelectatic change. Small pericardial effusion. Electronically Signed: By: Lowella Grip III M.D. On: 12/05/2018 10:13    ____________________________________________   PROCEDURES  Procedure(s) performed (including Critical Care):  .Critical Care Performed by: [name] [place], MD Authorized by: [name] [place], MD   Critical care provider statement:    Critical care time (minutes):  30   Critical care was necessary to treat or prevent imminent or life-threatening deterioration of the following conditions:  Sepsis   Critical care was time spent personally by me on the following activities:  Discussions with consultants, evaluation of patient's response to treatment, examination of patient, ordering and performing treatments and interventions, ordering and review of laboratory studies, ordering and review of radiographic studies, pulse oximetry, re-evaluation of patient's condition, obtaining history from patient or surrogate and review of old charts     ____________________________________________   INITIAL IMPRESSION / ASSESSMENT AND PLAN / ED COURSE  PORCHA DEBLANC was evaluated in Emergency Department on 12/17/2018 for the symptoms described in the history of present illness. She was evaluated in the context of the global COVID-19 pandemic, which necessitated  consideration that the patient might be at risk for infection with the SARS-CoV-2 virus that causes COVID-19. Institutional protocols and algorithms that pertain to the evaluation of patients at risk for COVID-19 are in a state of rapid change based on information released by regulatory bodies including the CDC and federal and state organizations. These policies and algorithms were followed during the patient's care in the ED.     Patient is a 48 year old with metastatic breast cancer who presents with worsening abdominal pain.  Bedside ultrasound does show ascites but nothing that seems amenable to tapping.  The majority of her ascites are very pelvic in nature.  Patient states that she does not like she has to urinate although her bladder looked a little enlarged on the ultrasound.  She also had pain when I push directly on her bladder which is concerning for the possibility of cystitis.  Will get urine to further evaluate.  Will send repeat CT scan without contrast to evaluate for SBO versus increasing ascites.  Will get labs to evaluate for electrolyte abnormalities, AKI due to the decreased p.o. intake.  We will try to control patient's pain and discussed with Dr. Grayland Ormond from oncology.    LFTs slightly elevated but less than previous.  White count is elevated 12.5 which is double baseline.  With the white count and heart rate patient met SIRS criteria therefore will get blood cultures and start broad-spectrum antibiotics given cancer patient. Allergy to vanc. Helf off given no indwelling lines.  Hemoglobin is above baseline.  Platelets are low but around baseline.  Lactate elevated, Holding off of full fluid resuc. Given swelling in legs. Will start with 500 CC.   CT scan shows to be studies are slightly worse.  No evidence of bowel obstruction.    Given elevated white count will d/w medicine for admission given concern for possible SBP.  D/w medicine and they will  work on [employer] doing diagnostic  tap if necessary.  Dr. Grayland Ormond aware and agrees with plan.   Admit medicine.       ____________________________________________   FINAL CLINICAL IMPRESSION(S) / ED DIAGNOSES   Final diagnoses:  Generalized abdominal pain  Sepsis, due to unspecified organism, unspecified whether acute organ dysfunction present ([place])      MEDICATIONS GIVEN DURING THIS VISIT:  Medications  HYDROmorphone (DILAUDID) injection 1 mg (has no administration in time range)  metroNIDAZOLE (FLAGYL) IVPB 500 mg (500 mg Intravenous New Bag/Given 12/03/2018 1206)  fentaNYL (DURAGESIC) 25 MCG/HR 1 patch (1 patch Transdermal Patch Applied 12/06/2018 1144)  ceFEPIme (MAXIPIME) 2 g in sodium chloride 0.9 % 100 mL IVPB (has no administration in time range)  HYDROmorphone (DILAUDID) injection 1 mg (1 mg Intravenous Given 12/09/2018 0939)  ondansetron (ZOFRAN) injection 4 mg (4 mg Intravenous Given 12/06/2018 0939)  ceFEPIme (MAXIPIME) 2 g in sodium chloride 0.9 % 100 mL IVPB (0 g Intravenous Stopped 12/28/2018 1208)  sodium chloride 0.9 % bolus 500 mL (500 mLs Intravenous New Bag/Given 12/26/2018 1204)     ED Discharge Orders    None       Note:  This document was prepared using Dragon voice recognition software and may include unintentional dictation errors.   [name] Farrell, MD 12/12/2018 [phone]

## 2018-12-29 NOTE — ED Notes (Signed)
Date and time results received: 12/22/2018 1149   Test: lactic acid Critical Value: 2.8  Name of Provider Notified: Dr. Jari Pigg

## 2018-12-29 NOTE — ED Triage Notes (Signed)
Pt presents to ED via POV with c/o abdominal pain, distention and nausea. Pt states dx with breast cancer last year and has metastases to her liver. Pt states takes PO dilaudid at home, which eases pain but does not completely relieve. Pt states last dose of chemo pills approx 3 weeks ago, but is currently taking PO decadron at this time.

## 2018-12-29 NOTE — ED Notes (Signed)
Pt assisted to bedside commode

## 2018-12-29 NOTE — ED Notes (Signed)
EDP at bedside to perform bedside US on pt R side of abd.

## 2018-12-29 NOTE — ED Notes (Addendum)
Pt states she feels like her abd is distended and thinks she needs fluid drawn off abd. Pt c/o of abd pain and N&V.   A&O, speaking in complete sentences. On 2 L [place] chronically.

## 2018-12-29 NOTE — Consult Note (Signed)
Pharmacy Antibiotic Note  Shelby [name] is a 48 y.o. female admitted with intra-abdominal infection.  Patient has a PMH of metastatic breast cancer with metastasis to liver, and was recently discharged 11/27 for abdominal pain and shortness of breath.  Currently afebrile, WBC 12.5.  Patient is on decadron at home, and had last chemotherapy dose 3 weeks prior.  Plan: Cefepime 2 g x 1 dose given in ED.  Start Cefepime 2 g Q8 hours.  Pharmacy will monitor and adjust per renal function.  Height: 5\' 6"  (167.6 cm) Weight: 143 lb (64.9 kg) IBW/kg (Calculated) : 59.3  Temp (24hrs), Avg:97.7 F (36.5 C), Min:97.7 F (36.5 C), Max:97.7 F (36.5 C)  Recent Labs  Lab 12/23/18 0414 12/23/18 1004 12/23/18 1229 12/23/18 2102 12/24/18 0057 12/25/18 0400 12/03/2018 0919  WBC 5.4  --   --  3.9*  --  4.6 12.5*  CREATININE 0.49  --   --  0.63  --  0.46 0.56  LATICACIDVEN  --  2.4* 2.9* 3.2* 2.0*  --   --     Estimated Creatinine Clearance: 78.8 mL/min (by C-G formula based on SCr of 0.56 mg/dL).    Allergies  Allergen Reactions  . Hydrocodone Hives    Swollen face - many years ago. Thinks she has tolerated since.  . Iodine Hives  . Peanut Butter Flavor Other (See Comments)    Made mouth tingle  . Shellfish Allergy Hives  . Vancomycin Other (See Comments)    Itching and forehead turned red    Antimicrobials this admission: 11/29 cefepime >>  11/29 flagyl >>   Dose adjustments this admission: Na  Microbiology results: 11/29 BCx: sent 11/29 UCx: sent    Thank you for allowing pharmacy to be a part of this patient's care.  Gerald Dexter, PharmD Pharmacy Resident  12/01/2018 10:58 AM

## 2018-12-29 NOTE — ED Notes (Signed)
First set of blood cultures and grey top on ice sent to lab at this time.

## 2018-12-29 NOTE — ED Notes (Signed)
Second set of blood cultures sent to lab at this time.

## 2018-12-29 NOTE — Progress Notes (Signed)
Admitted from ED- reports pain "none" currently. Denies co's nausea at this time. DOE to [place] with 02 @l /[place]. Verbalizes some anxiety with feeling SOB with exertion. No acute distress/DOE resolves with rest. Reports only few hard small balls of stool 2 days ago with biscodyl tablet given. Abd. Distended with bowel sounds auscultated. 2+ edema moderate in LE's. Diminshed breath sound in bases. No cough. Emotional speaking about her mother who is now living with her just diagnosed with lung cancer. Tolerated ensure well; sipping on liquids; stays she cannot tolerate eating any foods at this time.

## 2018-12-29 NOTE — Progress Notes (Signed)
CODE SEPSIS - PHARMACY COMMUNICATION  **Broad Spectrum Antibiotics should be administered within 1 hour of Sepsis diagnosis**  Time Code Sepsis Called/Page Received: 11/29 @ 1027  Antibiotics Ordered: cefepime, flagyl  Time of 1st antibiotic administration: 11/29 @ 1142  Additional action taken by pharmacy: Called nurse to follow-up with antibiotic delivery.  Per nurse, antibiotic delivery complicated by another critical patient.  If necessary, Name of Provider/Nurse Contacted: RN Nancy Fetter ,PharmD 12/07/2018  10:45 AM

## 2018-12-29 NOTE — ED Notes (Signed)
Date and time results received: 12/27/2018 1349 (use smartphrase ".now" to insert current time)  Test: lactic acid Critical Value: 2.4  Name of Provider Notified: [place]

## 2018-12-29 NOTE — Consult Note (Signed)
Pharmacy Antibiotic Note  Shelby [name] is a 48 y.o. female admitted with intra-abdominal infection.  Patient has a PMH of metastatic breast cancer with metastasis to liver, and was recently discharged 11/27 for abdominal pain and shortness of breath.  Currently afebrile, WBC 12.5.  Patient is on decadron at home, and had last chemotherapy dose 3 weeks prior. Pharmacy consulted for Zosyn dosing. Patient received Cefepime and Metronidazole in the ED.  Plan: Zosyn 3.375g IV q8h  Height: 5\' 6"  (167.6 cm) Weight: 143 lb (64.9 kg) IBW/kg (Calculated) : 59.3  Temp (24hrs), Avg:97.7 F (36.5 C), Min:97.7 F (36.5 C), Max:97.7 F (36.5 C)  Recent Labs  Lab 12/23/18 0414 12/23/18 1004 12/23/18 1229 12/23/18 2102 12/24/18 0057 12/25/18 0400 12/14/2018 0919 12/03/2018 1113  WBC 5.4  --   --  3.9*  --  4.6 12.5*  --   CREATININE 0.49  --   --  0.63  --  0.46 0.56  --   LATICACIDVEN  --  2.4* 2.9* 3.2* 2.0*  --   --  2.8*    Estimated Creatinine Clearance: 78.8 mL/min (by C-G formula based on SCr of 0.56 mg/dL).    Allergies  Allergen Reactions  . Hydrocodone Hives    Swollen face - many years ago. Thinks she has tolerated since.  . Iodine Hives  . Peanut Butter Flavor Other (See Comments)    Made mouth tingle  . Shellfish Allergy Hives  . Vancomycin Other (See Comments)    Itching and forehead turned red    Antimicrobials this admission: 11/29 cefepime >> 11/29 11/29 flagyl >> 11/29 11/29 Zosyn >>  Dose adjustments this admission: Na  Microbiology results: 11/29 BCx: sent 11/29 UCx: sent    Thank you for allowing pharmacy to be a part of this patient's care.  Paulina Fusi, PharmD, BCPS 12/07/2018 1:01 PM

## 2018-12-29 NOTE — ED Notes (Addendum)
Pt given urine specimen cup and assisted to toilet to collect urine sample. Pt did not catch any urine in cup.

## 2018-12-29 NOTE — Consult Note (Signed)
Shelby [name]  [phone] [phone]  ID: Shelby [name] OB: 04-01-1970  MR#: [id number]  [id number]  Patient Care Team: Jerrol Banana., MD as PCP - General (Family Medicine) [name], MD as Consulting Physician (Cardiology) Alfonzo Feller, RN as [place] Management  CHIEF COMPLAINT: Stage IV triple negative breast cancer, worsening abdominal pain.  INTERVAL HISTORY: Patient is a 48 year old female who was recently diagnosed with progressive triple negative breast cancer with metastatic disease in her liver.  She presented to the emergency room with increasing abdominal/back pain, poor appetite, and nausea.  She also has significant anxiety.  Patient states her symptoms have significantly improved since receiving IV Dilaudid.  She has no neurologic complaints.  She denies any recent fevers.  She has a poor appetite, but denies weight loss.  She has no chest pain, shortness of breath, cough, or hemoptysis.  She denies any vomiting, constipation, or diarrhea.  She has no melena or hematochezia.  She has no urinary complaints.  Patient offers no further specific complaints today.  REVIEW OF SYSTEMS:   Review of Systems  Constitutional: Negative.  Negative for fever, malaise/fatigue and weight loss.  Respiratory: Negative.  Negative for cough, hemoptysis and shortness of breath.   Cardiovascular: Positive for leg swelling. Negative for chest pain.  Gastrointestinal: Positive for abdominal pain and nausea. Negative for constipation.  Genitourinary: Negative.  Negative for dysuria.  Musculoskeletal: Positive for back pain.  Skin: Negative.  Negative for rash.  Neurological: Negative.  Negative for dizziness, focal weakness, weakness and headaches.  Psychiatric/Behavioral: The patient is nervous/anxious.     As per HPI. Otherwise, a complete review of systems is negative.  PAST MEDICAL HISTORY: Past  Medical History:  Diagnosis Date   Anemia    h/o with pregnancy   Anxiety    Asthma    allergy induced-no inhaler   Cancer ([place])    breast left    Complication of anesthesia    Environmental allergies    Family history of adverse reaction to anesthesia    son-breathing problems-coded 1st time when he was 2 and had another surgery at 71 and had to be admitted for breathing problems   Family history of breast cancer    GERD (gastroesophageal reflux disease)    Headache    migraines   Hypertension    Mitral valve disease    Mitral valve prolapse    Painful menstrual periods    PONV (postoperative nausea and vomiting)    Vertigo     PAST SURGICAL HISTORY: Past Surgical History:  Procedure Laterality Date   ABDOMINAL HYSTERECTOMY  2010   supracervical    AXILLARY LYMPH NODE DISSECTION Left 07/12/2018   Procedure: AXILLARY LYMPH NODE DISSECTION;  Surgeon: Robert Bellow, MD;  Location: [facility] OR[facility];  Service: General;  Laterality: Left;   BREAST BIOPSY Left 11/2017   invasive ductal carcinoma and metastatic LN   BREAST BIOPSY WITH SENTINEL LYMPH NODE BIOPSY AND NEEDLE LOCALIZATION Left 07/12/2018   Procedure: BREAST BIOPSY WIDE EXCISION WITH SENTINEL NODE  AND NEEDLE LOCALIZATION OF [name] LEFT;  Surgeon: Robert Bellow, MD;  Location: [facility] OR[facility];  Service: General;  Laterality: Left;   BUNIONECTOMY Bilateral    MANDIBLE RECONSTRUCTION     age 37-underbite   PORTACATH PLACEMENT Right 12/31/2017   Procedure: INSERTION PORT-A-CATH;  Surgeon: Robert Bellow, MD;  Location: [facility];  Service: General;  Laterality: Right;   RE-EXCISION OF BREAST  LUMPECTOMY Left 07/24/2018   Procedure: RE-EXCISION OF BREAST LUMPECTOMY LEFT;  Surgeon: Robert Bellow, MD;  Location: [facility];  Service: General;  Laterality: Left;   SHOULDER ARTHROSCOPY WITH OPEN ROTATOR CUFF REPAIR Right 04/26/2017   Procedure: SHOULDER ARTHROSCOPY WITH OPEN ROTATOR CUFF  REPAIR,SUBACROMINAL DECOMPRESSION;  Surgeon: Thornton Park, MD;  Location: [facility];  Service: Orthopedics;  Laterality: Right;   TONSILLECTOMY      FAMILY HISTORY: Family History  Problem Relation Age of Onset   Breast cancer Mother 42   Diabetes Father    Cancer Maternal Uncle        colon   Breast cancer Maternal Grandmother 83    ADVANCED DIRECTIVES (Y/N):  @ADVDIR @  HEALTH MAINTENANCE: Social History   Tobacco Use   Smoking status: Former Smoker    Packs/day: 0.50    Years: 10.00    Pack years: 5.00    Types: Cigarettes    Quit date: 04/20/2007    Years since quitting: 11.7   Smokeless tobacco: Never Used   Tobacco comment: social smoker back then  Substance Use Topics   Alcohol use: Yes    Comment: occas   Drug use: No     Colonoscopy:  PAP:  Bone density:  Lipid panel:  Allergies  Allergen Reactions   Hydrocodone Hives    Swollen face - many years ago. Thinks she has tolerated since.   Iodine Hives   Peanut Butter Flavor Other (See Comments)    Made mouth tingle   Shellfish Allergy Hives   Vancomycin Other (See Comments)    Itching and forehead turned red    Current Facility-Administered Medications  Medication Dose Route Frequency Provider Last Rate Last Dose   ceFEPIme (MAXIPIME) 2 g in sodium chloride 0.9 % 100 mL IVPB  2 g Intravenous Once [name] Woolstock, MD 200 mL/hr at 12/14/2018 1142 2 g at 12/06/2018 1142   ceFEPIme (MAXIPIME) 2 g in sodium chloride 0.9 % 100 mL IVPB  2 g Intravenous Q8H Gerald Dexter, RPH       fentaNYL (DURAGESIC) 25 MCG/HR 1 patch  1 patch Transdermal Q72H Lloyd Huger, MD   1 patch at 12/30/2018 1144   HYDROmorphone (DILAUDID) injection 1 mg  1 mg Intravenous Q1H PRN [name] Clyde, MD       metroNIDAZOLE (FLAGYL) IVPB 500 mg  500 mg Intravenous Once [name] [place], MD       sodium chloride 0.9 % bolus 500 mL  500 mL Intravenous Once [name] [place], MD       Current Outpatient Medications    Medication Sig Dispense Refill   ALPRAZolam (XANAX) 0.5 MG tablet Take 0.5-1 tablets (0.25-0.5 mg total) by mouth 2 (two) times daily as needed for anxiety. (Patient not taking: Reported on 11/22/2018) 60 tablet 0   bisacodyl (DULCOLAX) 5 MG EC tablet Take 1 tablet (5 mg total) by mouth daily as needed for moderate constipation. 30 tablet 1   Cholecalciferol (VITAMIN D) 50 MCG (2000 UT) CAPS Take 2,000 Units by mouth every evening.      dexamethasone (DECADRON) 4 MG tablet Take 2 tablets (8 mg total) by mouth every 8 (eight) hours for 28 days. 84 tablet 1   docusate sodium (COLACE) 100 MG capsule Take 1 capsule (100 mg total) by mouth 2 (two) times daily. 60 capsule 11   famotidine (PEPCID) 20 MG tablet Take 20 mg by mouth daily as needed for heartburn.   0   HYDROmorphone (  DILAUDID) 2 MG tablet Take 0.5 tablets (1 mg total) by mouth every 4 (four) hours as needed for up to 14 days for severe pain (cancer associated pain). 40 tablet 0   ibuprofen (ADVIL,MOTRIN) 200 MG tablet Take 200 mg by mouth daily as needed for mild pain.      losartan (COZAAR) 50 MG tablet TAKE 1 TABLET BY MOUTH DAILY (Patient taking differently: Take 50 mg by mouth daily. ) 30 tablet 1   mometasone (NASONEX) 50 MCG/ACT nasal spray Place 2 sprays into the nose daily. (Patient taking differently: Place 2 sprays into the nose daily as needed (allergies). ) 17 g 12   montelukast (SINGULAIR) 10 MG tablet TAKE 1 TABLET BY MOUTH AT BEDTIME (Patient taking differently: Take 10 mg by mouth daily as needed (allergies). ) 30 tablet 12   Multiple Vitamin (MULTIVITAMIN WITH MINERALS) TABS tablet Take 1 tablet by mouth daily. 30 tablet 0   Omega-3 1000 MG CAPS Take 1,000 mg by mouth every evening.      omeprazole (PRILOSEC) 20 MG capsule Take 1 capsule (20 mg total) by mouth daily. 30 capsule 3   sucralfate (CARAFATE) 1 g tablet Take 1 tablet (1 g total) by mouth 4 (four) times daily -  with meals and at bedtime. 90 tablet 3    traMADol (ULTRAM) 50 MG tablet Take 1 tablet (50 mg total) by mouth every 12 (twelve) hours as needed for severe pain. 10 tablet 0    OBJECTIVE: Vitals:   12/15/2018 1100 12/11/2018 1130  BP: (!) 145/91 (!) 153/91  Pulse: 79 85  Resp: 14 15  Temp:    SpO2: 95% 93%     Body mass index is 23.08 kg/m.    ECOG FS:1 - Symptomatic but completely ambulatory  General: Well-developed, well-nourished, no acute distress. Eyes: Pink conjunctiva, anicteric sclera. HEENT: Normocephalic, moist mucous membranes, clear oropharnyx. Lungs: Clear to auscultation bilaterally. Heart: Regular rate and rhythm. No rubs, murmurs, or gallops. Abdomen: Mildly distended, hepatomegaly.  Nontender.   Musculoskeletal: 1+ bilateral lower extremity edema. Neuro: Alert, answering all questions appropriately. Cranial nerves grossly intact. Skin: No rashes or petechiae noted. Psych: Normal affect. Lymphatics: No cervical, calvicular, axillary or inguinal LAD.   LAB RESULTS:  Lab Results  Component Value Date   NA 133 (L) 12/25/2018   K 4.2 12/23/2018   CL 93 (L) 12/09/2018   CO2 25 12/23/2018   GLUCOSE 121 (H) 12/09/2018   BUN 17 12/14/2018   CREATININE 0.56 12/23/2018   CALCIUM 8.7 (L) 12/26/2018   PROT 5.8 (L) 12/12/2018   ALBUMIN 2.7 (L) 12/02/2018   AST 133 (H) 12/13/2018   ALT 85 (H) 12/11/2018   ALKPHOS 118 12/28/2018   BILITOT 1.1 12/28/2018   GFRNONAA >60 12/03/2018   GFRAA >60 12/22/2018    Lab Results  Component Value Date   WBC 12.5 (H) 12/05/2018   NEUTROABS 3.0 12/23/2018   HGB 12.2 12/22/2018   HCT 34.8 (L) 12/03/2018   MCV 90.6 12/02/2018   PLT 40 (L) 12/07/2018     STUDIES: Ct Abdomen Pelvis Wo Contrast  Addendum Date: 12/02/2018   ADDENDUM REPORT: 12/25/2018 10:16 ADDENDUM: Urinary bladder wall mildly thickened. A degree of cystitis questioned. Electronically Signed   By: Lowella Grip III M.D.   On: 12/02/2018 10:16   Result Date: 12/15/2018 CLINICAL DATA:   Abdominal pain with nausea and distention. Breast carcinoma with known liver metastases EXAM: CT ABDOMEN AND PELVIS WITHOUT CONTRAST TECHNIQUE: Multidetector CT imaging of the  abdomen and pelvis was performed following the standard protocol without oral or IV contrast. COMPARISON:  December 21, 2018 FINDINGS: Lower chest: There is a right pleural effusion with atelectatic change in the right base. There is atelectatic change in the left base with rather minimal left pleural effusion. Small pericardial effusion noted. Hepatobiliary: There is widespread metastatic disease is seen throughout the liver, better delineated on recent contrast-enhanced study. Gallbladder is largely collapsed. No biliary duct dilatation evident. Pancreas: No pancreatic mass or inflammatory focus. Spleen: No splenic lesions are evident. Adrenals/Urinary Tract: Adrenals appear unremarkable bilaterally. Kidneys bilaterally show no evident mass or hydronephrosis on either side. There is no evident renal or ureteral calculus on either side. Urinary bladder is midline. The urinary bladder wall thickness is mildly increased. Stomach/Bowel: There is moderate stool in the colon. There is no appreciable bowel wall or mesenteric thickening. No evident bowel obstruction. Terminal ileal region appears unremarkable. There is no free air or portal venous air. Vascular/Lymphatic: No abdominal aortic aneurysm. No arterial lesions seen on this noncontrast enhanced study. Multiple upper abdominal venous collaterals are again noted without change from recent study. A focal periceliac lymph node measuring 1.6 x 1.3 cm is stable. No new adenopathy evident compared to recent study. Reproductive: Uterus absent.  No pelvic mass evident. Other: There is moderate ascites, slightly increased compared to recent study. No abscess evident in the abdomen or pelvis. No periappendiceal region inflammation. Appendix not appreciable. Musculoskeletal: No lytic or destructive  bone lesions are evident. A small sclerotic focus in the right sacrum may represent a small bone island. No intramuscular lesions are evident. IMPRESSION: 1.  Moderate ascites, slightly increased compared to recent study. 2.  Widespread hepatic metastatic disease. 3. No bowel obstruction. No abscess in the abdomen or pelvis. No periappendiceal region inflammation. 4.  Enlarged periceliac lymph node, suspected due to neoplasm. 5. Venous collaterals in the upper abdomen, primarily on the left, stable. Further assessment with respect to etiology not possible without intravenous contrast. Appearance stable compared to recent contrast enhanced CT examination. 6.  Uterus absent. 7. Small pleural effusions with bibasilar atelectatic change. Small pericardial effusion. Electronically Signed: By: Lowella Grip III M.D. On: 12/02/2018 10:13   Ct Abdomen Pelvis Wo Contrast  Addendum Date: 12/18/2018   ADDENDUM REPORT: 12/18/2018 20:18 ADDENDUM: These results were called by telephone on 12/18/2018 at 8:18 pm to provider [facility] , who verbally acknowledged these results. Electronically Signed   By: Lovena Le M.D.   On: 12/18/2018 20:18   Result Date: 12/18/2018 CLINICAL DATA:  Abdominal pain and distension, undergoing chemotherapy for breast cancer, history of IBS and diverticulitis EXAM: CT ABDOMEN AND PELVIS WITHOUT CONTRAST TECHNIQUE: Multidetector CT imaging of the abdomen and pelvis was performed following the standard protocol without IV contrast. COMPARISON:  CT abdomen pelvis 01/01/2018 FINDINGS: Lower chest: The patchy areas of subpleural ground-glass opacity could reflect atelectasis or early infection. Cardiac size is normal. Trace pericardial effusion, similar to prior. Hepatobiliary: Ill-defined regions of hypoattenuation are seen the anterior left lobe liver. Slightly nodular hepatic surface contour which is new from comparison exam. These features are both incompletely assessed in the absence of  contrast media. Gallbladder appears largely contracted at the time of exam. No biliary ductal dilatation. Pancreas: Unremarkable. No pancreatic ductal dilatation or surrounding inflammatory changes. Spleen: Normal in size without focal abnormality. Adrenals/Urinary Tract: No suspicious adrenal lesions. No visible or contour deforming renal lesions. No urolithiasis or hydronephrosis mild circumferential bladder wall thickening. Stomach/Bowel: Distal esophagus,  stomach and duodenal sweep are unremarkable. No small bowel wall thickening or dilatation. No evidence of obstruction. Appendix is not clearly identified. No pericecal inflammation. There is some mild mural thickening of the splenic flexure and descending colon remaining portions of the colon are free of mural thickening or dilatation. Vascular/Lymphatic: Upper abdominal venous collateralization and splenorenal shunting is noted. No suspicious or enlarged lymph nodes in the included lymphatic chains. Reproductive: Uterus is surgically absent. No concerning adnexal lesions. Other: Small volume of low-attenuation ascites noted in the low abdomen. Small volume of perihepatic low-attenuation ascites is present as well. Musculoskeletal: Multilevel degenerative changes are present in the imaged portions of the spine. No acute osseous abnormality or suspicious osseous lesion. IMPRESSION: 1. Interval development of cirrhotic stigmata including a nodular liver surface contour and increasing upper abdominal venous collateralization. Small volume of ascites is noted as well. 2. Ill-defined regions of hypoattenuation are seen within the anterior left lobe liver, which are incompletely assessed in the absence of contrast media. Furthermore findings are worrisome in the setting of known malignancy and potential intrinsic liver disease. Consider further evaluation with MRI or ultrasound if patient is unable to tolerate CT contrast due to allergy. 3. Mild mural thickening of  the splenic flexure and descending colon, could reflect change related to the additional cirrhotic findings/portal colopathy though should exclude a an acute colitis clinically. 4. Patchy areas of subpleural ground-glass opacity in the visualized lung bases could reflect atelectasis or early infection. 5. Mild circumferential bladder wall thickening, which may be related to underdistention. Correlate with urinalysis to exclude cystitis. 6. Unchanged trace pericardial effusion. Electronically Signed: By: Lovena Le M.D. On: 12/18/2018 20:07   Dg Chest 1 View  Result Date: 12/23/2018 CLINICAL DATA:  Cirrhosis, ascites, SIRS, history LEFT breast cancer, hypertension EXAM: CHEST  1 VIEW COMPARISON:  Portable exam 1016 hours compared to 12/21/2018 FINDINGS: RIGHT subclavian Port-A-Cath with tip projecting over superior RIGHT atrium, unchanged. Normal heart size, mediastinal contours, and pulmonary vascularity. Bibasilar atelectasis. Remaining lungs clear. No pleural effusion or pneumothorax. IMPRESSION: Bibasilar atelectasis. Electronically Signed   By: Lavonia Dana M.D.   On: 12/23/2018 10:58   Ct Angio Chest Pe W And/or Wo Contrast  Result Date: 12/21/2018 CLINICAL DATA:  48 y.o. female discharged yesterday for concerns of abdominal pain with a work-up regarding her liver as well as she is meeting SIRS criteria with feverPatient comes back today reports she continues to have significant pain and discomfort. She has been having ongoing and severe pain located in the mid to right upper abdomen, also she reports fairly significant pain when she takes a deep breath over the right lower lungShe has been feeling warm had a fever when she was in the hospital yesterday. Some nausea. No headaches. Tested negative for Covid a few days ago pain is located primarily in the right upper abdomen under the right rib cage. EXAM: CT ANGIOGRAPHY CHEST CT ABDOMEN AND PELVIS WITH CONTRAST TECHNIQUE: Multidetector CT imaging of  the chest was performed using the standard protocol during bolus administration of intravenous contrast. Multiplanar CT image reconstructions and MIPs were obtained to evaluate the vascular anatomy. Multidetector CT imaging of the abdomen and pelvis was performed using the standard protocol during bolus administration of intravenous contrast. CONTRAST:  83mL OMNIPAQUE IOHEXOL 350 MG/ML SOLN COMPARISON:  Chest CTA and abdomen and pelvis CT dated 12/19/2018. FINDINGS: CTA CHEST FINDINGS Cardiovascular: There is satisfactory opacification of the pulmonary arteries to the segmental level. No evidence of a pulmonary  embolism. Mediastinum/Nodes: No enlarged mediastinal, hilar, or axillary lymph nodes. Thyroid gland, trachea, and esophagus demonstrate no significant findings. Lungs/Pleura: Small bilateral pleural effusions. There is dependent opacity in the lower lobes consistent with atelectasis. Small areas of ground-glass opacity and small lung nodules noted. Dominant nodule in the right upper lobe, image 46, series 4, 6 mm. Several other small nodules are new from the prior CT. Area of ground-glass opacity adjacent to the oblique fissure in the left upper lobe is new. No pneumothorax. Musculoskeletal: No chest wall abnormality. No acute or significant osseous findings. Review of the MIP images confirms the above findings. CT ABDOMEN and PELVIS FINDINGS Hepatobiliary: Enlarged liver. There are multiple hypoattenuating areas throughout the liver. These areas are more apparent and more extensive than on the recent prior CT, particularly evident at the dome of segment 4A, along the anterior aspect of the right lobe and medial segment of the left lobe and along the inferior aspect of the right lobe, segment 5 and 6. Gallbladder is mostly collapsed. There is increased attenuation material within the gallbladder similar to the prior CT. No bile duct dilation Pancreas: Unremarkable. No pancreatic ductal dilatation or  surrounding inflammatory changes. Spleen: Normal in size without focal abnormality. Adrenals/Urinary Tract: No adrenal masses. Right kidney displaced inferiorly by the enlarged liver. Symmetric renal enhancement and excretion. No renal masses, stones or hydronephrosis. Normal ureters. Bladder wall is prominent, unchanged from the recent prior study. No bladder masses. Stomach/Bowel: Stomach is unremarkable, mostly decompressed. Small bowel and colon are normal in caliber. No wall thickening or inflammation. Appendix not discretely seen. No findings of appendicitis. Vascular/Lymphatic: As noted on the prior CT, left portal vein is attenuated, but does show enhancement. No evidence of thrombosis. There are venous collaterals in the upper abdomen. Aorta is unremarkable. No enlarged lymph nodes. Prominent Peri celiac node noted on the prior study is stable. Reproductive: Status post hysterectomy. No adnexal masses. Other: Small amount of ascites increased when compared to the prior CT. Musculoskeletal: No acute or significant osseous findings. Review of the MIP images confirms the above findings. IMPRESSION: CHEST CTA 1. No evidence of a pulmonary embolism. 2. Small pleural effusions, new from the prior CT. There is increased opacity in the lower lobes that is consistent with atelectasis. There several new small nodules and small areas of ground-glass opacity when compared to the prior CT. Suspect these are inflammatory given change from the recent exam. ABDOMEN AND PELVIS CT 1. Multiple hypoattenuating liver lesions. These appear to have progressed when compared to the prior CT, although this change could be due to differences in contrast timing. The liver is enlarged, stable from the recent exam. Findings are concerning for multifocal hepatocellular carcinoma. Follow-up liver MRI without and with contrast, when the patient can tolerate the procedure, is recommended. 2. Small amount ascites has increased in quantity  from the prior study. 3. There are vascular collaterals in the upper abdomen, stable. Electronically Signed   By: Lajean Manes M.D.   On: 12/21/2018 18:36   Ct Angio Chest Pe W And/or Wo Contrast  Result Date: 12/19/2018 CLINICAL DATA:  Chest pain, complex, intermediate/high prob of ACS/PE/AAS; Abd distension Epigastric pain. Exertional dyspnea. Cough. Weakness. History of breast cancer. EXAM: CT ANGIOGRAPHY CHEST CT ABDOMEN AND PELVIS WITH CONTRAST TECHNIQUE: Multidetector CT imaging of the chest was performed using the standard protocol during bolus administration of intravenous contrast. Multiplanar CT image reconstructions and MIPs were obtained to evaluate the vascular anatomy. Multidetector CT imaging of the  abdomen and pelvis was performed using the standard protocol during bolus administration of intravenous contrast. No reported contrast reaction. CONTRAST:  133mL OMNIPAQUE IOHEXOL 350 MG/ML SOLN COMPARISON:  Abdominal CT without contrast yesterday. Chest CT 01/01/2018 FINDINGS: CTA CHEST FINDINGS Cardiovascular: There are no filling defects within the pulmonary arteries to suggest pulmonary embolus. Thoracic aorta is normal in caliber without dissection. Left vertebral artery arises directly from the thoracic aorta, variant arch anatomy. Heart is normal in size. No pericardial effusion. Right chest port in place with tip in the SVC. Mediastinum/Nodes: No mediastinal or hilar adenopathy. No visualized thyroid nodule. No esophageal wall thickening. Lungs/Pleura: Mild patchy areas of subpleural nodular and ground-glass opacities. Basilar findings are unchanged from CT yesterday. 5 mm right upper lobe pulmonary nodule series 6, image 41. Sub solid nodularity slightly more inferiorly in the right lung series 6, image 45 measuring 8 mm. Tiny 2 mm nodule in the right upper lung series 6, image 32, may be fissural. Pulmonary nodules are new from prior chest CT. Perifissural in subpleural opacity in the right  upper lobe, also series 6, image 32, nonspecific. No pulmonary edema. No pleural fluid. Trachea and mainstem bronchi are patent. Musculoskeletal: There are no acute or suspicious osseous abnormalities. Review of the MIP images confirms the above findings. CT ABDOMEN and PELVIS FINDINGS Hepatobiliary: Hepatic parenchyma diffusely heterogeneous with nodular contours. Liver is increased in size from December 2019 CT. Gallbladder is decompressed and not well assessed, intraluminal high density may be stones or sludge. Questionable wall thickening may be related to nondistention. Pancreas: No ductal dilatation or inflammation. Spleen: Normal in size without focal abnormality. Adrenals/Urinary Tract: Normal adrenal glands. No hydronephrosis or perinephric edema. Homogeneous renal enhancement with symmetric excretion on delayed phase imaging. Urinary bladder is partially distended, mild bladder wall thickening. Stomach/Bowel: Lack of enteric contrast and paucity of intra-abdominal fat limits detailed bowel assessment. The stomach is nondistended. Questionable pre-pyloric gastric wall thickening. No small bowel obstruction or inflammatory change. Appendix not discretely visualized, no pericecal inflammation to suggest appendicitis. Transverse colon is tortuous. Mild colonic wall thickening at the splenic flexure, colon is decompressed, this is not well assessed. Questionable areas of descending colonic wall thickening, for example series 11, image 61. Vascular/Lymphatic: Right portal vein is patent. The left portal vein is attenuated without evidence of thrombus. Tortuous splenic vein with prominent collaterals in the left upper quadrant and left retroperitoneum. 9 mm perigastric lymph node series 11, image 29, nonspecific. No pelvic adenopathy. Reproductive: Uterus surgically absent. Left ovary tentatively visualized and normal. Right ovary not definitively seen. No obvious adnexal mass. Other: Small volume of pelvic  ascites measuring simple fluid density, similar to yesterday. Small perihepatic ascites anteriorly. No free air. Musculoskeletal: There are no acute or suspicious osseous abnormalities. Scattered bone islands in the pelvis. Review of the MIP images confirms the above findings. IMPRESSION: CT chest: 1. No pulmonary embolus. 2. Mild patchy areas of subpleural nodular and ground-glass opacities in both lungs. Slightly more confluent subpleural/perifissural right upper lobe opacity. Findings may be due to atelectasis or infection. 3. Small pulmonary nodules are new from December, infectious/inflammatory or metastatic. CT abdomen/pelvis: 1. Diffusely heterogeneous liver with nodular contours. Liver has increased in size since December 2019 CT. This may be due to underlying cirrhotic change versus diffuse metastatic disease. Recommend further evaluation with abdominal MRI. Left upper quadrant and retroperitoneal collaterals can be seen with portal hypertension. Left portal vein is attenuated, however no discrete thrombus. 2. Questionable pre-pyloric gastric wall thickening, peptic  ulcer disease versus gastritis. 3. Bladder wall thickening, small volume abdominopelvic ascites, and areas of colonic wall thickening at the splenic flexure and descending colon are unchanged from CT yesterday. 4. Gallbladder is decompressed and not well assessed, intraluminal high density may be stones or sludge. Questionable gallbladder wall thickening may be related to nondistention or liver disease. This could be further evaluated with MRI or right upper quadrant ultrasound. Electronically Signed   By: Keith Rake M.D.   On: 12/19/2018 05:33   Mr Abdomen W Wo Contrast  Result Date: 12/19/2018 CLINICAL DATA:  Abdominal pain in a patient with breast cancer. EXAM: MRI ABDOMEN WITHOUT AND WITH CONTRAST TECHNIQUE: Multiplanar multisequence MR imaging of the abdomen was performed both before and after the administration of intravenous  contrast. CONTRAST:  92mL GADAVIST GADOBUTROL 1 MMOL/ML IV SOLN COMPARISON:  CT of 12/19/2018 FINDINGS: Lower chest: Limited assessment of the lower chest on MRI is unremarkable. Hepatobiliary: Diffuse infiltration of much of the liver, nearly the entire left hepatic lobe, medial section and lateral section in addition to anterior right hepatic lobe with confluent infiltrative restricted diffusion and heterogeneous enhancement measuring approximately 21 by 7.4 cm. Diffuse metastatic disease is strongly suspected. Boundaries are well-demarcated without adjacent edema in the bordering hepatic parenchyma Discrete hepatic lesions are also scattered about the right hepatic lobe largest in the dome of the right hemi liver measuring approximately 2.5 cm in greatest dimension. These discrete lesions display a targetoid appearance. There are numerous lesions in the right hepatic lobe, lesions are found in all hepatic subsegment. Pancreas: No mass, inflammatory changes, or other parenchymal abnormality identified. Mild ductal distension in the tail of the pancreas may be due to adjacent mass effect. No visible pancreatic lesion is noted. Spleen:  Within normal limits in size and appearance. Adrenals/Urinary Tract: No masses identified. No evidence of hydronephrosis. Stomach/Bowel: Limited assessment of the gastrointestinal tract without signs of acute process. Vascular/Lymphatic: Left-sided peri-renal venous collaterals of uncertain significance perhaps related to elevated portal pressures in the setting of diffuse hepatic involvement. 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 Right portal vein is patent. Scattered small lymph nodes in the upper abdomen. There is either slow flow or thrombus within the main portal vein at the level of the splenic portal confluence extending in the splenic vein. The SMV is patent. There is mass effect upon the vessel at the level of the splenic portal confluence due to hepatic enlargement. This is best  seen on subtraction images, image 54, series 20. Note that slow flow in an otherwise opacified vessel can cause artifact with a similar appearance however, this is seen on contrasted and non contrasted imaging with a similar appearance. Other:  None. Musculoskeletal: No signs of acute or destructive bone process. IMPRESSION: 1. Diffuse infiltration of much of the liver, nearly the entire left hepatic lobe, with confluent infiltrative restricted diffusion and heterogeneous enhancement measuring approximately 21 by 7.4 cm. Diffuse metastatic disease is strongly suspected with multifocal lesions in the right hepatic lobe. Superimposed infection is difficult to exclude though areas at the boundary of these lesions show no overt signs of edema. 2. Highly narrowed or occluded left portal vein with potential thrombus versus slow flow in the splenoportal confluence. This was patent on the exam of 12/19/2018; however, given above findings a venous phase CT or even ultrasound hepatic Doppler with special attention to the splenic portal confluence may be helpful to exclude this possibility, finding is also exhibited on TrueFISP sequence (image 24, series 10). 3.  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 4. Mild ductal distension in the tail of the pancreas, potentially due to mass effect. Attention on follow-up. 5. Critical Value/emergent results were called by telephone at the time of interpretation on 12/19/2018 at 6:43 pm to [place] , who verbally acknowledged these results. Electronically Signed   By: Zetta Bills M.D.   On: 12/19/2018 18:24   US Abdomen Complete  Result Date: 12/19/2018 CLINICAL DATA:  Abdominal pain EXAM: ABDOMEN ULTRASOUND COMPLETE COMPARISON:  MRI earlier today FINDINGS: Gallbladder: Gallbladder is contracted with associated mild wall thickening measuring up to 4 mm. Sludge noted within  the gallbladder. No visible stones. Common bile duct: Diameter: Normal caliber, 3 mm Liver: Diffusely heterogeneous echotexture throughout the liver corresponding to the abnormality seen on today's MRI suspicious forinfiltrating mass. Main portal vein is patent on color Doppler imaging with normal direction of blood flow towards the liver. The area of possible thrombus seen on MRI at the level of the splenic portal confluence not visualized. IVC: No abnormality visualized. Pancreas: Visualized portion unremarkable. Spleen: Size and appearance within normal limits. Right Kidney: Length: 11.0 cm. Echogenicity within normal limits. No mass or hydronephrosis visualized. Left Kidney: Length: 10.5 cm. Echogenicity within normal limits. No mass or hydronephrosis visualized. Abdominal aorta: No aneurysm visualized. Other findings: None. IMPRESSION: Heterogeneous appearance throughout much of the left hepatic lobe as seen on today's MRI concerning for infiltrating mass/tumor. Main portal vein is patent. Contracted gallbladder with gallbladder wall thickening likely related to contracted state. Sludge within the gallbladder. No sonographic Murphy sign or visible stones. Electronically Signed   By: Rolm Baptise M.D.   On: 12/19/2018 20:35   Ct Abdomen Pelvis W Contrast  Result Date: 12/21/2018 CLINICAL DATA:  48 y.o. female discharged yesterday for concerns of abdominal pain with a work-up regarding her liver as well as she is meeting SIRS criteria with feverPatient comes back today reports she continues to have significant pain and discomfort. She has been having ongoing and severe pain located in the mid to right upper abdomen, also she reports fairly significant pain when she takes a deep breath over the right lower lungShe has been feeling warm had a fever when she was in the hospital yesterday. Some nausea. No headaches. Tested negative for Covid a few days ago pain is located primarily in the right upper abdomen  under the right rib cage. EXAM: CT ANGIOGRAPHY CHEST CT ABDOMEN AND PELVIS WITH CONTRAST TECHNIQUE: Multidetector CT imaging of the chest was performed using the standard protocol during bolus administration of intravenous contrast. Multiplanar CT image reconstructions and MIPs were obtained to evaluate the vascular anatomy. Multidetector CT imaging of the abdomen and pelvis was performed using the standard protocol during bolus administration of intravenous contrast. CONTRAST:  10mL OMNIPAQUE IOHEXOL 350 MG/ML SOLN COMPARISON:  Chest CTA and abdomen and pelvis CT dated 12/19/2018. FINDINGS: CTA CHEST FINDINGS Cardiovascular: There is satisfactory opacification of the pulmonary arteries to the segmental level. No evidence of a pulmonary embolism. Mediastinum/Nodes: No enlarged mediastinal, hilar, or axillary lymph nodes. Thyroid gland, trachea, and esophagus demonstrate no significant findings. Lungs/Pleura: Small bilateral pleural effusions. There is dependent opacity in the lower lobes consistent with atelectasis. Small areas of ground-glass opacity and small lung nodules noted. Dominant nodule in the right upper lobe, image 46, series 4, 6 mm. Several other small nodules are new from the prior CT. Area of ground-glass opacity  adjacent to the oblique fissure in the left upper lobe is new. No pneumothorax. Musculoskeletal: No chest wall abnormality. No acute or significant osseous findings. Review of the MIP images confirms the above findings. CT ABDOMEN and PELVIS FINDINGS Hepatobiliary: Enlarged liver. There are multiple hypoattenuating areas throughout the liver. These areas are more apparent and more extensive than on the recent prior CT, particularly evident at the dome of segment 4A, along the anterior aspect of the right lobe and medial segment of the left lobe and along the inferior aspect of the right lobe, segment 5 and 6. Gallbladder is mostly collapsed. There is increased attenuation material within the  gallbladder similar to the prior CT. No bile duct dilation Pancreas: Unremarkable. No pancreatic ductal dilatation or surrounding inflammatory changes. Spleen: Normal in size without focal abnormality. Adrenals/Urinary Tract: No adrenal masses. Right kidney displaced inferiorly by the enlarged liver. Symmetric renal enhancement and excretion. No renal masses, stones or hydronephrosis. Normal ureters. Bladder wall is prominent, unchanged from the recent prior study. No bladder masses. Stomach/Bowel: Stomach is unremarkable, mostly decompressed. Small bowel and colon are normal in caliber. No wall thickening or inflammation. Appendix not discretely seen. No findings of appendicitis. Vascular/Lymphatic: As noted on the prior CT, left portal vein is attenuated, but does show enhancement. No evidence of thrombosis. There are venous collaterals in the upper abdomen. Aorta is unremarkable. No enlarged lymph nodes. Prominent Peri celiac node noted on the prior study is stable. Reproductive: Status post hysterectomy. No adnexal masses. Other: Small amount of ascites increased when compared to the prior CT. Musculoskeletal: No acute or significant osseous findings. Review of the MIP images confirms the above findings. IMPRESSION: CHEST CTA 1. No evidence of a pulmonary embolism. 2. Small pleural effusions, new from the prior CT. There is increased opacity in the lower lobes that is consistent with atelectasis. There several new small nodules and small areas of ground-glass opacity when compared to the prior CT. Suspect these are inflammatory given change from the recent exam. ABDOMEN AND PELVIS CT 1. Multiple hypoattenuating liver lesions. These appear to have progressed when compared to the prior CT, although this change could be due to differences in contrast timing. The liver is enlarged, stable from the recent exam. Findings are concerning for multifocal hepatocellular carcinoma. Follow-up liver MRI without and with  contrast, when the patient can tolerate the procedure, is recommended. 2. Small amount ascites has increased in quantity from the prior study. 3. There are vascular collaterals in the upper abdomen, stable. Electronically Signed   By: Lajean Manes M.D.   On: 12/21/2018 18:36   Ct Abdomen Pelvis W Contrast  Result Date: 12/19/2018 CLINICAL DATA:  Chest pain, complex, intermediate/high prob of ACS/PE/AAS; Abd distension Epigastric pain. Exertional dyspnea. Cough. Weakness. History of breast cancer. EXAM: CT ANGIOGRAPHY CHEST CT ABDOMEN AND PELVIS WITH CONTRAST TECHNIQUE: Multidetector CT imaging of the chest was performed using the standard protocol during bolus administration of intravenous contrast. Multiplanar CT image reconstructions and MIPs were obtained to evaluate the vascular anatomy. Multidetector CT imaging of the abdomen and pelvis was performed using the standard protocol during bolus administration of intravenous contrast. No reported contrast reaction. CONTRAST:  110mL OMNIPAQUE IOHEXOL 350 MG/ML SOLN COMPARISON:  Abdominal CT without contrast yesterday. Chest CT 01/01/2018 FINDINGS: CTA CHEST FINDINGS Cardiovascular: There are no filling defects within the pulmonary arteries to suggest pulmonary embolus. Thoracic aorta is normal in caliber without dissection. Left vertebral artery arises directly from the thoracic aorta, variant arch anatomy.  Heart is normal in size. No pericardial effusion. Right chest port in place with tip in the SVC. Mediastinum/Nodes: No mediastinal or hilar adenopathy. No visualized thyroid nodule. No esophageal wall thickening. Lungs/Pleura: Mild patchy areas of subpleural nodular and ground-glass opacities. Basilar findings are unchanged from CT yesterday. 5 mm right upper lobe pulmonary nodule series 6, image 41. Sub solid nodularity slightly more inferiorly in the right lung series 6, image 45 measuring 8 mm. Tiny 2 mm nodule in the right upper lung series 6, image 32,  may be fissural. Pulmonary nodules are new from prior chest CT. Perifissural in subpleural opacity in the right upper lobe, also series 6, image 32, nonspecific. No pulmonary edema. No pleural fluid. Trachea and mainstem bronchi are patent. Musculoskeletal: There are no acute or suspicious osseous abnormalities. Review of the MIP images confirms the above findings. CT ABDOMEN and PELVIS FINDINGS Hepatobiliary: Hepatic parenchyma diffusely heterogeneous with nodular contours. Liver is increased in size from December 2019 CT. Gallbladder is decompressed and not well assessed, intraluminal high density may be stones or sludge. Questionable wall thickening may be related to nondistention. Pancreas: No ductal dilatation or inflammation. Spleen: Normal in size without focal abnormality. Adrenals/Urinary Tract: Normal adrenal glands. No hydronephrosis or perinephric edema. Homogeneous renal enhancement with symmetric excretion on delayed phase imaging. Urinary bladder is partially distended, mild bladder wall thickening. Stomach/Bowel: Lack of enteric contrast and paucity of intra-abdominal fat limits detailed bowel assessment. The stomach is nondistended. Questionable pre-pyloric gastric wall thickening. No small bowel obstruction or inflammatory change. Appendix not discretely visualized, no pericecal inflammation to suggest appendicitis. Transverse colon is tortuous. Mild colonic wall thickening at the splenic flexure, colon is decompressed, this is not well assessed. Questionable areas of descending colonic wall thickening, for example series 11, image 61. Vascular/Lymphatic: Right portal vein is patent. The left portal vein is attenuated without evidence of thrombus. Tortuous splenic vein with prominent collaterals in the left upper quadrant and left retroperitoneum. 9 mm perigastric lymph node series 11, image 29, nonspecific. No pelvic adenopathy. Reproductive: Uterus surgically absent. Left ovary tentatively  visualized and normal. Right ovary not definitively seen. No obvious adnexal mass. Other: Small volume of pelvic ascites measuring simple fluid density, similar to yesterday. Small perihepatic ascites anteriorly. No free air. Musculoskeletal: There are no acute or suspicious osseous abnormalities. Scattered bone islands in the pelvis. Review of the MIP images confirms the above findings. IMPRESSION: CT chest: 1. No pulmonary embolus. 2. Mild patchy areas of subpleural nodular and ground-glass opacities in both lungs. Slightly more confluent subpleural/perifissural right upper lobe opacity. Findings may be due to atelectasis or infection. 3. Small pulmonary nodules are new from December, infectious/inflammatory or metastatic. CT abdomen/pelvis: 1. Diffusely heterogeneous liver with nodular contours. Liver has increased in size since December 2019 CT. This may be due to underlying cirrhotic change versus diffuse metastatic disease. Recommend further evaluation with abdominal MRI. Left upper quadrant and retroperitoneal collaterals can be seen with portal hypertension. Left portal vein is attenuated, however no discrete thrombus. 2. Questionable pre-pyloric gastric wall thickening, peptic ulcer disease versus gastritis. 3. Bladder wall thickening, small volume abdominopelvic ascites, and areas of colonic wall thickening at the splenic flexure and descending colon are unchanged from CT yesterday. 4. Gallbladder is decompressed and not well assessed, intraluminal high density may be stones or sludge. Questionable gallbladder wall thickening may be related to nondistention or liver disease. This could be further evaluated with MRI or right upper quadrant ultrasound. Electronically Signed   By:  Keith Rake M.D.   On: 12/19/2018 05:33   US Biopsy (liver)  Result Date: 12/20/2018 INDICATION: History of left breast carcinoma with imaging evidence of probable diffuse metastatic disease in the liver, more prominently  in the left lobe. The patient presents for biopsy. EXAM: ULTRASOUND GUIDED CORE BIOPSY OF LIVER MEDICATIONS: None. ANESTHESIA/SEDATION: Fentanyl 100 mcg IV; Versed 2.0 mg IV Moderate Sedation Time:  20 minutes. The patient was continuously monitored during the procedure by the interventional radiology nurse under my direct supervision. PROCEDURE: The procedure, risks, benefits, and alternatives were explained to the patient. Questions regarding the procedure were encouraged and answered. The patient understands and consents to the procedure. A time-out was performed prior to initiating the procedure. Ultrasound was used to localize lesions within the liver. The anterior abdominal wall was prepped with chlorhexidine in a sterile fashion, and a sterile drape was applied covering the operative field. A sterile gown and sterile gloves were used for the procedure. Local anesthesia was provided with 1% Lidocaine. Under direct ultrasound guidance, a 17 gauge trocar needle was advanced into the left lobe of the liver. After confirming needle tip position, coaxial 18 gauge core biopsy samples were obtained. A total of 3 samples were obtained and submitted in formalin. A slurry of Gel-Foam pledgets was then slowly injected via the outer needle as the needle was retracted. Additional ultrasound was performed. COMPLICATIONS: None immediate. FINDINGS: Ultrasound demonstrates heterogeneous appearance of the liver with ill-defined lesions throughout the left lobe. Solid core biopsy samples were obtained by sampling lesions within the lateral segment of the left lobe. IMPRESSION: Ultrasound-guided core biopsy performed of hepatic lesions within the left lobe. Electronically Signed   By: Aletta Edouard M.D.   On: 12/20/2018 13:44   Dg Chest Portable 1 View  Result Date: 12/21/2018 CLINICAL DATA:  Pleuritic right chest pain EXAM: PORTABLE CHEST 1 VIEW COMPARISON:  12/18/2018 chest radiograph. FINDINGS: Stable right subclavian  Port-A-Cath terminating over the right atrium. Stable cardiomediastinal silhouette with normal heart size. No pneumothorax. No pleural effusion. No pulmonary edema. Curvilinear left lung base opacities. No acute consolidative airspace disease. IMPRESSION: Curvilinear left lung base opacities, favor scarring or atelectasis. No acute consolidative airspace disease. Electronically Signed   By: Ilona Sorrel M.D.   On: 12/21/2018 13:06   Dg Chest Portable 1 View  Result Date: 12/18/2018 CLINICAL DATA:  Fever abdominal pain EXAM: PORTABLE CHEST 1 VIEW COMPARISON:  12/31/2017 FINDINGS: Right-sided central venous port with tip over the cavoatrial region. No focal airspace disease or effusion. Normal heart size. No pneumothorax. IMPRESSION: No active disease. Electronically Signed   By: Donavan Foil M.D.   On: 12/18/2018 19:49    ASSESSMENT: Stage IV triple negative breast cancer, worsening abdominal pain.  PLAN:    1.  Stage IV triple negative breast cancer: Recent liver biopsy confirmed diagnosis.  CT scan results from earlier today reviewed independently and report as above with no obvious progression of disease.  Patient has been instructed to keep her previously scheduled follow-up appointment on Wednesday, January 01, 2019 to initiate palliative chemotherapy with [place]. 2.  Abdominal pain: Likely related to malignancy, although patient has an increased white blood cell count and increased ascites therefore SBP is a possibility.  Case discussed with ED physician.  Will add 25 mcg fentanyl patch every 72 hours and continue Dilaudid as ordered. 3.  Elevated white blood cell count: Possible SBP as above.  Agree with IV antibiotics and monitor as inpatient for 24 hours to  ensure improvement. 4.  Thrombocytopenia: Likely multifactorial given patient's extensive liver disease and recent treatment with capecitabine chemotherapy.  Continue to monitor closely.  Appreciate consult, will follow.   Lloyd Huger, MD   12/14/2018 12:00 PM

## 2018-12-30 ENCOUNTER — Inpatient Hospital Stay: Payer: No Typology Code available for payment source

## 2018-12-30 ENCOUNTER — Other Ambulatory Visit: Payer: Self-pay | Admitting: *Deleted

## 2018-12-30 ENCOUNTER — Inpatient Hospital Stay (HOSPITAL_COMMUNITY): Payer: No Typology Code available for payment source

## 2018-12-30 LAB — CBC
HCT: 36.3 % (ref 36.0–46.0)
Hemoglobin: 12.1 g/dL (ref 12.0–15.0)
MCH: 31.2 pg (ref 26.0–34.0)
MCHC: 33.3 g/dL (ref 30.0–36.0)
MCV: 93.6 fL (ref 80.0–100.0)
Platelets: 39 10*3/uL — ABNORMAL LOW (ref 150–400)
RBC: 3.88 MIL/uL (ref 3.87–5.11)
RDW: 17.2 % — ABNORMAL HIGH (ref 11.5–15.5)
WBC: 10.5 10*3/uL (ref 4.0–10.5)
nRBC: 0 % (ref 0.0–0.2)

## 2018-12-30 LAB — COMPREHENSIVE METABOLIC PANEL
ALT: 88 U/L — ABNORMAL HIGH (ref 0–44)
AST: 147 U/L — ABNORMAL HIGH (ref 15–41)
Albumin: 2.7 g/dL — ABNORMAL LOW (ref 3.5–5.0)
Alkaline Phosphatase: 123 U/L (ref 38–126)
Anion gap: 13 (ref 5–15)
BUN: 19 mg/dL (ref 6–20)
CO2: 25 mmol/L (ref 22–32)
Calcium: 8.6 mg/dL — ABNORMAL LOW (ref 8.9–10.3)
Chloride: 93 mmol/L — ABNORMAL LOW (ref 98–111)
Creatinine, Ser: 0.63 mg/dL (ref 0.44–1.00)
GFR calc Af Amer: >60 mL/min (ref >60)
GFR calc non Af Amer: >60 mL/min (ref >60)
Glucose, Bld: 133 mg/dL — ABNORMAL HIGH (ref 70–99)
Potassium: 4.6 mmol/L (ref 3.5–5.1)
Sodium: 131 mmol/L — ABNORMAL LOW (ref 135–145)
Total Bilirubin: 1 mg/dL (ref 0.3–1.2)
Total Protein: 5.8 g/dL — ABNORMAL LOW (ref 6.5–8.1)

## 2018-12-30 LAB — PROTIME-INR
INR: 1.2 (ref 0.8–1.2)
Prothrombin Time: 14.6 seconds (ref 11.4–15.2)

## 2018-12-30 LAB — APTT: aPTT: 29 seconds (ref 24–36)

## 2018-12-30 LAB — GLUCOSE, CAPILLARY
Glucose-Capillary: 115 mg/dL — ABNORMAL HIGH (ref 70–99)
Glucose-Capillary: 118 mg/dL — ABNORMAL HIGH (ref 70–99)
Glucose-Capillary: 118 mg/dL — ABNORMAL HIGH (ref 70–99)
Glucose-Capillary: 189 mg/dL — ABNORMAL HIGH (ref 70–99)

## 2018-12-30 MED ORDER — POLYETHYLENE GLYCOL 3350 17 G PO PACK
17.0000 g | PACK | Freq: Every day | ORAL | Status: DC
  Administered 2018-12-30 – 2019-01-03 (×3): 17 g via ORAL
  Filled 2018-12-30 (×3): qty 1

## 2018-12-30 NOTE — Progress Notes (Signed)
PROGRESS NOTE    MARYLAN WATER  [id number] DOB: October 02, 1970 DOA: 12/02/2018 PCP: Jerrol Banana., MD   Brief Narrative:  Shelby [name] is a 48 y.o. female with medical history significant of triple negative status for metastatic breast cancer, HTN presented to the hospital with complaints of abdominal pain, distention and nausea.  Patient was admitted here few days ago and discharged on 11/27 .  After going home she continued report of mid abdominal bloating, fullness and pain with poor appetite.  This continued to progress therefore came back to the hospital may be subjective chills but denies any fevers and other complaints.  During her prior admission she was having some fever and it was thought to be secondary to tumor burden and she was started on steroids.  During this readmission she was having leukocytosis, lactic acidosis with abdominal pain and ascites.  CT abdomen with widespread metastatic disease to liver.  Due to her worsening pain it was thought to be due to SBP and she was started on Zosyn.  Subjective: Patient pain was improved overnight.  She rarely thinks that this antibiotic is working.  She was complaining of some constipation.  She was scared of her rapid disease progression and hoping to restart chemotherapy soon to control her disease.  She was worried about her kids, younger 39 is 77 year old.  Assessment & Plan:   Principal Problem:   Abdominal pain Active Problems:   Hypertension   Malignant neoplasm of female breast (HCC)   Pancytopenia (HCC)   Transaminitis   Liver mass   GERD (gastroesophageal reflux disease)   Sepsis (HCC)   Ascites, malignant  Sepsis, suspect intra-abdominal infection-SBP, Moderate malignant ascites.  Blood cultures remain negative. Leukocytosis can be due to steroid use, resolved today although patient remains on steroid, dose increased to 8 mg every 8 hourly. Pain responded to Zosyn, might have SBP  . -Ultrasound-guided paracentesis today with labs. -Continue Zosyn for now. -Add bowel regimen. -Continue pain management.  Metastatic breast cancer, triple negative stage IV Intermittent fever, tumor related -Follows outpatient oncology.  Plans for palliative chemotherapy starting on Wednesday. -We appreciate oncology input in her case. -Continue Decadron with CBG monitoring.  Peripheral neuropathy, left upper extremity.  Experience intermittent tingling and numbness.  TSH and B12 checked yesterday were within normal limit.  A1c 5.2. -Monitor can do cervical MRI if needed.  Anemia of chronic disease. Hemoglobin 12.1 stable.  Thrombocytopenia, platelet 39. -No obvious signs of bleeding.  Platelets similar to previous admission, will continue to monitor.  Essential hypertension.  Pressure within goal today. -Losartan  Anxiety -Xanax.  Objective: Vitals:   12/13/2018 1602 12/17/2018 1714 12/03/2018 2308 12/30/18 0901  BP: (!) 134/92 (!) 143/95 (!) 135/95 (!) 147/96  Pulse: 62 85 71 64  Resp: (!) 22 17 15 17   Temp:  97.7 F (36.5 C) (!) 97.3 F (36.3 C) 98.1 F (36.7 C)  TempSrc:  Oral Oral Oral  SpO2: 98% 95% 94% 94%  Weight:      Height:        Intake/Output Summary (Last 24 hours) at 12/30/2018 1339 Last data filed at 12/30/2018 1000 Gross per 24 hour  Intake 591.73 ml  Output --  Net 591.73 ml   Filed Weights   12/26/2018 0855  Weight: 64.9 kg    Examination:  General exam: Appears calm and comfortable  Respiratory system: Few basal crackles, respiratory effort normal. Cardiovascular system: S1 & S2 heard, RRR. No JVD, murmurs, rubs, gallops  or clicks. No pedal edema. Gastrointestinal system: Distended abdomen with positive fluid wave, nontender and no guarding, bowel sounds positive. Central nervous system: Alert and oriented. No focal neurological deficits. Extremities: Symmetric 5 x 5 power. Skin: No rashes, lesions or ulcers Psychiatry: Judgement and  insight appear normal. Mood & affect appropriate.   DVT prophylaxis: SCDs-thrombocytopenia. Code Status: Full Family Communication: No family at bedside Disposition Plan: Pending improvement.  Consultants:   Oncology  Procedures:  Paracentesis.  Antimicrobials:  Zosyn-11/29  Data Reviewed: I have personally reviewed following labs and imaging studies  CBC: Recent Labs  Lab 12/23/18 2102 12/25/18 0400 12/08/2018 0919 12/30/18 0530  WBC 3.9* 4.6 12.5* 10.5  NEUTROABS 3.0  --   --   --   HGB 10.3* 9.0* 12.2 12.1  HCT 28.5* 26.5* 34.8* 36.3  MCV 91.3 94.0 90.6 93.6  PLT 38* 49* 40* 39*   Basic Metabolic Panel: Recent Labs  Lab 12/23/18 2102 12/25/18 0400 12/02/2018 0919 12/30/18 0530  NA 131* 134* 133* 131*  K 3.6 3.4* 4.2 4.6  CL 94* 96* 93* 93*  CO2 25 27 25 25   GLUCOSE 165* 110* 121* 133*  BUN 10 9 17 19   CREATININE 0.63 0.46 0.56 0.63  CALCIUM 8.2* 8.2* 8.7* 8.6*   GFR: Estimated Creatinine Clearance: 78.8 mL/min (by C-G formula based on SCr of 0.63 mg/dL). Liver Function Tests: Recent Labs  Lab 12/23/18 2102 12/18/2018 0919 12/30/18 0530  AST 199* 133* 147*  ALT 124* 85* 88*  ALKPHOS 101 118 123  BILITOT 1.0 1.1 1.0  PROT 5.5* 5.8* 5.8*  ALBUMIN 2.6* 2.7* 2.7*   Recent Labs  Lab 12/28/2018 0919  LIPASE 20   No results for input(s): AMMONIA in the last 168 hours. Coagulation Profile: Recent Labs  Lab 12/30/18 0530  INR 1.2   Cardiac Enzymes: No results for input(s): CKTOTAL, CKMB, CKMBINDEX, TROPONINI in the last 168 hours. BNP (last 3 results) No results for input(s): PROBNP in the last 8760 hours. HbA1C: Recent Labs    12/30/2018 1319  HGBA1C 5.2   CBG: Recent Labs  Lab 12/30/2018 1712 12/30/2018 2130 12/30/18 0900 12/30/18 1207  GLUCAP 126* 157* 115* 118*   Lipid Profile: No results for input(s): CHOL, HDL, LDLCALC, TRIG, CHOLHDL, LDLDIRECT in the last 72 hours. Thyroid Function Tests: Recent Labs    12/25/2018 0919  TSH 2.557    Anemia Panel: Recent Labs    12/19/2018 1729  VITAMINB12 876   Sepsis Labs: Recent Labs  Lab 12/23/18 2102 12/24/18 0057 12/07/2018 1113 12/25/2018 1319  PROCALCITON  --  1.59  --   --   LATICACIDVEN 3.2* 2.0* 2.8* 2.4*    Recent Results (from the past 240 hour(s))  SARS CORONAVIRUS 2 (TAT 6-24 HRS) Nasopharyngeal Nasopharyngeal Swab     Status: None   Collection Time: 12/21/18  4:24 PM   Specimen: Nasopharyngeal Swab  Result Value Ref Range Status   SARS Coronavirus 2 NEGATIVE NEGATIVE Final    Comment: (NOTE) SARS-CoV-2 target nucleic acids are NOT DETECTED. The SARS-CoV-2 RNA is generally detectable in upper and lower respiratory specimens during the acute phase of infection. Negative results do not preclude SARS-CoV-2 infection, do not rule out co-infections with other pathogens, and should not be used as the sole basis for treatment or other patient management decisions. Negative results must be combined with clinical observations, patient history, and epidemiological information. The expected result is Negative. Fact Sheet for Patients: SugarRoll.be Fact Sheet for Healthcare Providers: https://www.woods-mathews.com/ This test  is not yet approved or cleared by the [place] and  has been authorized for detection and/or diagnosis of SARS-CoV-2 by FDA under an Emergency Use Authorization (EUA). This EUA will remain  in effect (meaning this test can be used) for the duration of the COVID-19 declaration under Section 56 4(b)(1) of the Act, 21 U.S.C. section 360bbb-3(b)(1), unless the authorization is terminated or revoked sooner. Performed at [facility], [place] [address] [place], [place] [address]   CULTURE, BLOOD (ROUTINE X 2) w Reflex to ID Panel     Status: None   Collection Time: 12/21/18  4:24 PM   Specimen: BLOOD  Result Value Ref Range Status   Specimen Description BLOOD R WRIST  Final   Special Requests    Final    BOTTLES DRAWN AEROBIC AND ANAEROBIC Blood Culture adequate volume   Culture   Final    NO GROWTH 5 DAYS Performed at [facility], [address] [place], [place] [address]    Report Status 12/26/2018 FINAL  Final  CULTURE, BLOOD (ROUTINE X 2) w Reflex to ID Panel     Status: None   Collection Time: 12/21/18  4:24 PM   Specimen: BLOOD  Result Value Ref Range Status   Specimen Description BLOOD RAC  Final   Special Requests   Final    BOTTLES DRAWN AEROBIC AND ANAEROBIC Blood Culture adequate volume   Culture   Final    NO GROWTH 5 DAYS Performed at [facility], [address] [place], [place] [address]    Report Status 12/26/2018 FINAL  Final  CULTURE, BLOOD (ROUTINE X 2) w Reflex to ID Panel     Status: None   Collection Time: 12/23/18 10:04 AM   Specimen: BLOOD  Result Value Ref Range Status   Specimen Description BLOOD BLOOD RIGHT HAND  Final   Special Requests   Final    BOTTLES DRAWN AEROBIC AND ANAEROBIC Blood Culture adequate volume   Culture   Final    NO GROWTH 5 DAYS Performed at [facility], [address] [place], [place] [address]    Report Status 12/28/2018 FINAL  Final  CULTURE, BLOOD (ROUTINE X 2) w Reflex to ID Panel     Status: None   Collection Time: 12/23/18 10:11 AM   Specimen: BLOOD  Result Value Ref Range Status   Specimen Description BLOOD BLOOD RIGHT HAND  Final   Special Requests   Final    BOTTLES DRAWN AEROBIC AND ANAEROBIC Blood Culture results may not be optimal due to an excessive volume of blood received in culture bottles   Culture   Final    NO GROWTH 5 DAYS Performed at [facility], [address] [place], [place] [address]    Report Status 12/28/2018 FINAL  Final  Blood culture (routine x 2)     Status: None (Preliminary result)   Collection Time: 12/07/2018 11:13 AM   Specimen: BLOOD  Result Value Ref Range Status   Specimen Description BLOOD RIGHT ANTECUBITAL  Final   Special  Requests   Final    BOTTLES DRAWN AEROBIC AND ANAEROBIC Blood Culture adequate volume   Culture   Final    NO GROWTH < 24 HOURS Performed at [facility], [address] [place], [place] [address]    Report Status PENDING  Incomplete  Blood culture (routine x 2)     Status: None (Preliminary result)   Collection Time: 12/12/2018 11:13 AM  Specimen: BLOOD  Result Value Ref Range Status   Specimen Description BLOOD LEFT ANTECUBITAL  Final   Special Requests   Final    BOTTLES DRAWN AEROBIC AND ANAEROBIC Blood Culture adequate volume   Culture   Final    NO GROWTH < 24 HOURS Performed at [facility], [address] [place], [place] [address]    Report Status PENDING  Incomplete  SARS CORONAVIRUS 2 (TAT 6-24 HRS) Nasopharyngeal Nasopharyngeal Swab     Status: None   Collection Time: 12/27/2018  1:19 PM   Specimen: Nasopharyngeal Swab  Result Value Ref Range Status   SARS Coronavirus 2 NEGATIVE NEGATIVE Final    Comment: (NOTE) SARS-CoV-2 target nucleic acids are NOT DETECTED. The SARS-CoV-2 RNA is generally detectable in upper and lower respiratory specimens during the acute phase of infection. Negative results do not preclude SARS-CoV-2 infection, do not rule out co-infections with other pathogens, and should not be used as the sole basis for treatment or other patient management decisions. Negative results must be combined with clinical observations, patient history, and epidemiological information. The expected result is Negative. Fact Sheet for Patients: SugarRoll.be Fact Sheet for Healthcare Providers: https://www.woods-mathews.com/ This test is not yet approved or cleared by the [place] FDA and  has been authorized for detection and/or diagnosis of SARS-CoV-2 by FDA under an Emergency Use Authorization (EUA). This EUA will remain  in effect (meaning this test can be used) for the duration of the COVID-19  declaration under Section 56 4(b)(1) of the Act, 21 U.S.C. section 360bbb-3(b)(1), unless the authorization is terminated or revoked sooner. Performed at [facility], [place] [address] [place], [place] [address]      Radiology Studies: Ct Abdomen Pelvis Wo Contrast  Addendum Date: 12/08/2018   ADDENDUM REPORT: 12/26/2018 10:16 ADDENDUM: Urinary bladder wall mildly thickened. A degree of cystitis questioned. Electronically Signed   By: Lowella Grip III M.D.   On: 12/28/2018 10:16   Result Date: 12/17/2018 CLINICAL DATA:  Abdominal pain with nausea and distention. Breast carcinoma with known liver metastases EXAM: CT ABDOMEN AND PELVIS WITHOUT CONTRAST TECHNIQUE: Multidetector CT imaging of the abdomen and pelvis was performed following the standard protocol without oral or IV contrast. COMPARISON:  December 21, 2018 FINDINGS: Lower chest: There is a right pleural effusion with atelectatic change in the right base. There is atelectatic change in the left base with rather minimal left pleural effusion. Small pericardial effusion noted. Hepatobiliary: There is widespread metastatic disease is seen throughout the liver, better delineated on recent contrast-enhanced study. Gallbladder is largely collapsed. No biliary duct dilatation evident. Pancreas: No pancreatic mass or inflammatory focus. Spleen: No splenic lesions are evident. Adrenals/Urinary Tract: Adrenals appear unremarkable bilaterally. Kidneys bilaterally show no evident mass or hydronephrosis on either side. There is no evident renal or ureteral calculus on either side. Urinary bladder is midline. The urinary bladder wall thickness is mildly increased. Stomach/Bowel: There is moderate stool in the colon. There is no appreciable bowel wall or mesenteric thickening. No evident bowel obstruction. Terminal ileal region appears unremarkable. There is no free air or portal venous air. Vascular/Lymphatic: No abdominal aortic aneurysm. No  arterial lesions seen on this noncontrast enhanced study. Multiple upper abdominal venous collaterals are again noted without change from recent study. A focal periceliac lymph node measuring 1.6 x 1.3 cm is stable. No new adenopathy evident compared to recent study. Reproductive: Uterus absent.  No pelvic mass evident. Other: There is moderate ascites, slightly increased compared  to recent study. No abscess evident in the abdomen or pelvis. No periappendiceal region inflammation. Appendix not appreciable. Musculoskeletal: No lytic or destructive bone lesions are evident. A small sclerotic focus in the right sacrum may represent a small bone island. No intramuscular lesions are evident. IMPRESSION: 1.  Moderate ascites, slightly increased compared to recent study. 2.  Widespread hepatic metastatic disease. 3. No bowel obstruction. No abscess in the abdomen or pelvis. No periappendiceal region inflammation. 4.  Enlarged periceliac lymph node, suspected due to neoplasm. 5. Venous collaterals in the upper abdomen, primarily on the left, stable. Further assessment with respect to etiology not possible without intravenous contrast. Appearance stable compared to recent contrast enhanced CT examination. 6.  Uterus absent. 7. Small pleural effusions with bibasilar atelectatic change. Small pericardial effusion. Electronically Signed: By: Lowella Grip III M.D. On: 12/17/2018 10:13    Scheduled Meds:  Chlorhexidine Gluconate Cloth  6 each Topical Daily   cholecalciferol  2,000 Units Oral QPM   dexamethasone  8 mg Oral Q8H   feeding supplement (ENSURE ENLIVE)  237 mL Oral BID BM   fentaNYL  1 patch Transdermal Q72H   insulin aspart  0-9 Units Subcutaneous TID WC   losartan  50 mg Oral Daily   multivitamin with minerals  1 tablet Oral Daily   omega-3 acid ethyl esters  1,000 mg Oral QPM   pantoprazole  40 mg Oral Daily   sodium chloride flush  10 mL Intravenous q morning - 10a   sucralfate  1 g  Oral TID WC & HS   Continuous Infusions:  sodium chloride 250 mL (12/30/18 0548)   piperacillin-tazobactam (ZOSYN)  IV 3.375 g (12/30/18 0549)     LOS: 1 day   Time spent: 45 minutes.  I personally reviewed her chart.  Lorella Nimrod, MD [facility] Pager [phone]  If 7PM-7AM, please contact night-coverage [email or web address] Password [facility] 12/30/2018, 1:39 PM   This record has been created using Systems analyst. Errors have been sought and corrected,but may not always be located. Such creation errors do not reflect on the standard of care.

## 2018-12-30 NOTE — Patient Outreach (Signed)
[facility]) Care Management  12/30/2018  Shelby [name] November 07, 1970 [id number]   Care Coordination   Referral Received : 12/19/25 Insurance: [place] , [place]    Patient admitted 12/18/18- 12/20/18 at [facility] Readmitted 12/21/18-12/27/18 at [facility]  Readmitted 12/15/2018 at [facility]    Per electronic record on 11/29  Shelby [name] is a 48 y.o. female with medical history significant of triple negative status for metastatic breast cancer,with metastatic disease in liver.  Hospital admission with chief complaint abdominal pain, nausea and abdominal distention, liver mass.  Patient with readmissions less than 24-48 hours after discharge, post discharge transition of care outreach unable to complete.    Plan Will follow progress for discharge disposition  and notify [place] hospital liaison, Natividad Brood .   Joylene Draft, RN, [place] Management Coordinator  [phone]- Mobile [phone]- Toll Free Main Office

## 2018-12-31 ENCOUNTER — Inpatient Hospital Stay: Payer: No Typology Code available for payment source

## 2018-12-31 ENCOUNTER — Other Ambulatory Visit: Payer: Self-pay

## 2018-12-31 ENCOUNTER — Encounter: Payer: Self-pay | Admitting: Oncology

## 2018-12-31 ENCOUNTER — Encounter: Payer: Self-pay | Admitting: Radiology

## 2018-12-31 LAB — URINE CULTURE: Culture: 10000 — AB

## 2018-12-31 LAB — CBC
HCT: 34.3 % — ABNORMAL LOW (ref 36.0–46.0)
Hemoglobin: 12.2 g/dL (ref 12.0–15.0)
MCH: 32 pg (ref 26.0–34.0)
MCHC: 35.6 g/dL (ref 30.0–36.0)
MCV: 90 fL (ref 80.0–100.0)
Platelets: 34 10*3/uL — ABNORMAL LOW (ref 150–400)
RBC: 3.81 MIL/uL — ABNORMAL LOW (ref 3.87–5.11)
RDW: 17.2 % — ABNORMAL HIGH (ref 11.5–15.5)
WBC: 9.8 10*3/uL (ref 4.0–10.5)
nRBC: 0 % (ref 0.0–0.2)

## 2018-12-31 LAB — COMPREHENSIVE METABOLIC PANEL
ALT: 88 U/L — ABNORMAL HIGH (ref 0–44)
AST: 151 U/L — ABNORMAL HIGH (ref 15–41)
Albumin: 2.5 g/dL — ABNORMAL LOW (ref 3.5–5.0)
Alkaline Phosphatase: 142 U/L — ABNORMAL HIGH (ref 38–126)
Anion gap: 11 (ref 5–15)
BUN: 17 mg/dL (ref 6–20)
CO2: 27 mmol/L (ref 22–32)
Calcium: 8.5 mg/dL — ABNORMAL LOW (ref 8.9–10.3)
Chloride: 93 mmol/L — ABNORMAL LOW (ref 98–111)
Creatinine, Ser: 0.58 mg/dL (ref 0.44–1.00)
GFR calc Af Amer: >60 mL/min (ref >60)
GFR calc non Af Amer: >60 mL/min (ref >60)
Glucose, Bld: 147 mg/dL — ABNORMAL HIGH (ref 70–99)
Potassium: 4.8 mmol/L (ref 3.5–5.1)
Sodium: 131 mmol/L — ABNORMAL LOW (ref 135–145)
Total Bilirubin: 1 mg/dL (ref 0.3–1.2)
Total Protein: 5.5 g/dL — ABNORMAL LOW (ref 6.5–8.1)

## 2018-12-31 LAB — GLUCOSE, CAPILLARY
Glucose-Capillary: 153 mg/dL — ABNORMAL HIGH (ref 70–99)
Glucose-Capillary: 129 mg/dL — ABNORMAL HIGH (ref 70–99)
Glucose-Capillary: 141 mg/dL — ABNORMAL HIGH (ref 70–99)
Glucose-Capillary: 154 mg/dL — ABNORMAL HIGH (ref 70–99)

## 2018-12-31 LAB — FOLATE RBC
Folate, Hemolysate: 336 ng/mL
Folate, RBC: 966 ng/mL (ref >498)
Hematocrit: 34.8 % (ref 34.0–46.6)

## 2018-12-31 MED ORDER — DIPHENHYDRAMINE HCL 50 MG/ML IJ SOLN
50.0000 mg | Freq: Once | INTRAMUSCULAR | Status: AC
  Administered 2018-12-31: 50 mg via INTRAVENOUS
  Filled 2018-12-31: qty 1

## 2018-12-31 MED ORDER — IOHEXOL 350 MG/ML SOLN
75.0000 mL | Freq: Once | INTRAVENOUS | Status: AC | PRN
  Administered 2018-12-31: 75 mL via INTRAVENOUS

## 2018-12-31 MED ORDER — HYDROCORTISONE NA SUCCINATE PF 250 MG IJ SOLR
200.0000 mg | Freq: Once | INTRAMUSCULAR | Status: AC
  Administered 2018-12-31: 200 mg via INTRAVENOUS
  Filled 2018-12-31: qty 200

## 2018-12-31 MED ORDER — SODIUM CHLORIDE 0.9 % IV SOLN
1.0000 g | INTRAVENOUS | Status: DC
  Administered 2018-12-31: 1 g via INTRAVENOUS
  Filled 2018-12-31: qty 10
  Filled 2018-12-31: qty 1

## 2018-12-31 MED ORDER — DIPHENHYDRAMINE HCL 25 MG PO CAPS
50.0000 mg | ORAL_CAPSULE | Freq: Once | ORAL | Status: AC
  Filled 2018-12-31: qty 2

## 2018-12-31 NOTE — Progress Notes (Signed)
PROGRESS NOTE    Shelby [name]  [id number] DOB: 03-Jun-1970 DOA: 12/14/2018 PCP: Jerrol Banana., MD   Brief Narrative:  Shelby [name] is a 48 y.o. female with medical history significant of triple negative status for metastatic breast cancer, HTN presented to the hospital with complaints of abdominal pain, distention and nausea.  Patient was admitted here few days ago and discharged on 11/27 .  After going home she continued report of mid abdominal bloating, fullness and pain with poor appetite.  This continued to progress therefore came back to the hospital may be subjective chills but denies any fevers and other complaints.  During her prior admission she was having some fever and it was thought to be secondary to tumor burden and she was started on steroids.  During this readmission she was having leukocytosis, lactic acidosis with abdominal pain and ascites.  CT abdomen with widespread metastatic disease to liver.  Due to her worsening pain it was thought to be due to SBP and she was started on Zosyn.  Urine culture grew 10,000 colonies of Klebsiella with ESBL positive.  It was resistant to Zosyn and antibiotics switched to ceftriaxone.  Subjective: Patient was complaining of worsening dyspnea especially whenever she moved to go to the restroom.  She seems very anxious and worried about her prognosis.  Assessment & Plan:   Principal Problem:   Abdominal pain Active Problems:   Hypertension   Malignant neoplasm of female breast (HCC)   Pancytopenia (HCC)   Transaminitis   Liver mass   GERD (gastroesophageal reflux disease)   Sepsis (HCC)   Ascites, malignant  Sepsis, suspect intra-abdominal infection-SBP, Moderate malignant ascites.  Blood cultures remain negative. Leukocytosis can be due to steroid use, resolved today although patient remains on steroid, dose increased to 8 mg every 8 hourly. Pain responded to Zosyn, might have SBP . Urine culture grew  10,000 colonies of ESBL positive Klebsiella which were resistant to Zosyn. -Switch Zosyn with ceftriaxone according to sensitivity report it can also cover for presumed SBP. -Paracentesis could not be performed due to her thrombocytopenia and ultrasound with only mild ascites. -Add bowel regimen. -Continue pain management.  Acute hypoxic respiratory failure.  Patient started becoming hypoxic with mild exercise for example going to restroom requiring up to 4 L.  Chest was clear and there was no calf tenderness.  CTA done on 11-21 was negative for PE.  Her Geneva score was 5 and Wells criteria was 4 which made her moderate risk.  Patient has active advanced malignancy. Discussed with Dr. Grayland Ormond her oncologist and we will repeat CTA. She has contrast allergy and radiology contrast allergy premedications were ordered.  Metastatic breast cancer, triple negative stage IV Intermittent fever, tumor related -Follows outpatient oncology.  Plans for palliative chemotherapy starting on Wednesday. -We appreciate oncology input in her case. -Continue Decadron with CBG monitoring.  Peripheral neuropathy, left upper extremity.  Experience intermittent tingling and numbness.  TSH and B12 checked yesterday were within normal limit.  A1c 5.2. -Monitor can do cervical MRI if needed.  Anemia of chronic disease. Hemoglobin 12.1 stable.  Thrombocytopenia, worsening, platelet 34. -No obvious signs of bleeding.  -Continue monitoring.  Essential hypertension.  Pressure within goal today. -Losartan  Anxiety -Xanax.  Objective: Vitals:   12/30/18 2027 12/31/18 0500 12/31/18 0500 12/31/18 0813  BP: (!) 127/97  (!) 124/91 (!) 125/96  Pulse: 86  83 85  Resp: 19  18 18   Temp: 98.2 F (36.8 C)  (!)  97.4 F (36.3 C)   TempSrc: Oral  Oral   SpO2: 93%  92% 95%  Weight:  63.2 kg    Height:        Intake/Output Summary (Last 24 hours) at 12/31/2018 1428 Last data filed at 12/31/2018 0900 Gross per 24  hour  Intake 322.82 ml  Output -  Net 322.82 ml   Filed Weights   12/14/2018 0855 12/31/18 0500  Weight: 64.9 kg 63.2 kg    Examination:  General exam: Anxious lady, in no acute distress. Respiratory system: Few basal crackles, respiratory effort normal. Cardiovascular system: S1 & S2 heard, RRR. No JVD, murmurs, rubs, gallops or clicks. No pedal edema. Gastrointestinal system: Distended abdomen with positive fluid wave, nontender and no guarding, bowel sounds positive. Central nervous system: Alert and oriented. No focal neurological deficits. Extremities: Symmetric 5 x 5 power. Skin: No rashes, lesions or ulcers Psychiatry: Judgement and insight appear normal. Mood & affect appropriate.   DVT prophylaxis: SCDs-thrombocytopenia. Code Status: Full Family Communication: No family at bedside Disposition Plan: Pending improvement.  Consultants:   Oncology  Procedures:   Antimicrobials:  Zosyn-11/29-12/1 Ceftriaxone 12/1  Data Reviewed: I have personally reviewed following labs and imaging studies  CBC: Recent Labs  Lab 12/25/18 0400 12/17/2018 0919 12/30/18 0530 12/31/18 0514  WBC 4.6 12.5* 10.5 9.8  HGB 9.0* 12.2 12.1 12.2  HCT 26.5* 34.8* 36.3 34.3*  MCV 94.0 90.6 93.6 90.0  PLT 49* 40* 39* 34*   Basic Metabolic Panel: Recent Labs  Lab 12/25/18 0400 12/28/2018 0919 12/30/18 0530 12/31/18 0514  NA 134* 133* 131* 131*  K 3.4* 4.2 4.6 4.8  CL 96* 93* 93* 93*  CO2 27 25 25 27   GLUCOSE 110* 121* 133* 147*  BUN 9 17 19 17   CREATININE 0.46 0.56 0.63 0.58  CALCIUM 8.2* 8.7* 8.6* 8.5*   GFR: Estimated Creatinine Clearance: 78.8 mL/min (by C-G formula based on SCr of 0.58 mg/dL). Liver Function Tests: Recent Labs  Lab 12/08/2018 0919 12/30/18 0530 12/31/18 0514  AST 133* 147* 151*  ALT 85* 88* 88*  ALKPHOS 118 123 142*  BILITOT 1.1 1.0 1.0  PROT 5.8* 5.8* 5.5*  ALBUMIN 2.7* 2.7* 2.5*   Recent Labs  Lab 12/28/2018 0919  LIPASE 20   No results for  input(s): AMMONIA in the last 168 hours. Coagulation Profile: Recent Labs  Lab 12/30/18 0530  INR 1.2   Cardiac Enzymes: No results for input(s): CKTOTAL, CKMB, CKMBINDEX, TROPONINI in the last 168 hours. BNP (last 3 results) No results for input(s): PROBNP in the last 8760 hours. HbA1C: Recent Labs    12/10/2018 1319  HGBA1C 5.2   CBG: Recent Labs  Lab 12/30/18 1207 12/30/18 1632 12/30/18 2130 12/31/18 0811 12/31/18 1142  GLUCAP 118* 118* 189* 153* 129*   Lipid Profile: No results for input(s): CHOL, HDL, LDLCALC, TRIG, CHOLHDL, LDLDIRECT in the last 72 hours. Thyroid Function Tests: Recent Labs    12/25/2018 0919  TSH 2.557   Anemia Panel: Recent Labs    12/15/2018 1729  VITAMINB12 876   Sepsis Labs: Recent Labs  Lab 12/28/2018 1113 12/28/2018 1319  LATICACIDVEN 2.8* 2.4*    Recent Results (from the past 240 hour(s))  SARS CORONAVIRUS 2 (TAT 6-24 HRS) Nasopharyngeal Nasopharyngeal Swab     Status: None   Collection Time: 12/21/18  4:24 PM   Specimen: Nasopharyngeal Swab  Result Value Ref Range Status   SARS Coronavirus 2 NEGATIVE NEGATIVE Final    Comment: (NOTE) SARS-CoV-2 target  nucleic acids are NOT DETECTED. The SARS-CoV-2 RNA is generally detectable in upper and lower respiratory specimens during the acute phase of infection. Negative results do not preclude SARS-CoV-2 infection, do not rule out co-infections with other pathogens, and should not be used as the sole basis for treatment or other patient management decisions. Negative results must be combined with clinical observations, patient history, and epidemiological information. The expected result is Negative. Fact Sheet for Patients: SugarRoll.be Fact Sheet for Healthcare Providers: https://www.woods-mathews.com/ This test is not yet approved or cleared by the [place] FDA and  has been authorized for detection and/or diagnosis of SARS-CoV-2 by FDA  under an Emergency Use Authorization (EUA). This EUA will remain  in effect (meaning this test can be used) for the duration of the COVID-19 declaration under Section 56 4(b)(1) of the Act, 21 U.S.C. section 360bbb-3(b)(1), unless the authorization is terminated or revoked sooner. Performed at [facility], [place] [address], [place], [place] [address]   CULTURE, BLOOD (ROUTINE X 2) w Reflex to ID Panel     Status: None   Collection Time: 12/21/18  4:24 PM   Specimen: BLOOD  Result Value Ref Range Status   Specimen Description BLOOD R WRIST  Final   Special Requests   Final    BOTTLES DRAWN AEROBIC AND ANAEROBIC Blood Culture adequate volume   Culture   Final    NO GROWTH 5 DAYS Performed at [facility], [address] [place], [place] [address]    Report Status 12/26/2018 FINAL  Final  CULTURE, BLOOD (ROUTINE X 2) w Reflex to ID Panel     Status: None   Collection Time: 12/21/18  4:24 PM   Specimen: BLOOD  Result Value Ref Range Status   Specimen Description BLOOD RAC  Final   Special Requests   Final    BOTTLES DRAWN AEROBIC AND ANAEROBIC Blood Culture adequate volume   Culture   Final    NO GROWTH 5 DAYS Performed at [facility], [address] [place], [place] [address]    Report Status 12/26/2018 FINAL  Final  CULTURE, BLOOD (ROUTINE X 2) w Reflex to ID Panel     Status: None   Collection Time: 12/23/18 10:04 AM   Specimen: BLOOD  Result Value Ref Range Status   Specimen Description BLOOD BLOOD RIGHT HAND  Final   Special Requests   Final    BOTTLES DRAWN AEROBIC AND ANAEROBIC Blood Culture adequate volume   Culture   Final    NO GROWTH 5 DAYS Performed at [facility], [address] [place], [place] [address]    Report Status 12/28/2018 FINAL  Final  CULTURE, BLOOD (ROUTINE X 2) w Reflex to ID Panel     Status: None   Collection Time: 12/23/18 10:11 AM   Specimen: BLOOD  Result Value Ref Range Status   Specimen Description  BLOOD BLOOD RIGHT HAND  Final   Special Requests   Final    BOTTLES DRAWN AEROBIC AND ANAEROBIC Blood Culture results may not be optimal due to an excessive volume of blood received in culture bottles   Culture   Final    NO GROWTH 5 DAYS Performed at [facility], [address] [place], [place] [address]    Report Status 12/28/2018 FINAL  Final  Urine culture     Status: Abnormal   Collection Time: 12/13/2018  9:20 AM   Specimen: Urine, Clean Catch  Result Value Ref Range Status  Specimen Description   Final    URINE, CLEAN CATCH Performed at [facility], [address] [place], [place] [address]    Special Requests   Final    NONE Performed at [facility], [address] [place], [place] [address]    Culture (A)  Final    10,000 COLONIES/mL KLEBSIELLA OXYTOCA Confirmed Extended Spectrum Beta-Lactamase Producer (ESBL).  In bloodstream infections from ESBL organisms, carbapenems are preferred over piperacillin/tazobactam. They are shown to have a lower risk of mortality.    Report Status 12/31/2018 FINAL  Final   Organism ID, Bacteria KLEBSIELLA OXYTOCA (A)  Final      Susceptibility   Klebsiella oxytoca - MIC*    AMPICILLIN >=32 RESISTANT Resistant     CEFAZOLIN >=64 RESISTANT Resistant     CEFTRIAXONE 8 SENSITIVE Sensitive     CIPROFLOXACIN <=0.25 SENSITIVE Sensitive     GENTAMICIN <=1 SENSITIVE Sensitive     IMIPENEM <=0.25 SENSITIVE Sensitive     NITROFURANTOIN 32 SENSITIVE Sensitive     TRIMETH/SULFA <=20 SENSITIVE Sensitive     AMPICILLIN/SULBACTAM >=32 RESISTANT Resistant     PIP/TAZO >=128 RESISTANT Resistant     Extended ESBL POSITIVE Resistant     * 10,000 COLONIES/mL KLEBSIELLA OXYTOCA  Blood culture (routine x 2)     Status: None (Preliminary result)   Collection Time: 12/24/2018 11:13 AM   Specimen: BLOOD  Result Value Ref Range Status   Specimen Description BLOOD RIGHT ANTECUBITAL  Final   Special Requests   Final    BOTTLES  DRAWN AEROBIC AND ANAEROBIC Blood Culture adequate volume   Culture   Final    NO GROWTH 2 DAYS Performed at [facility], [address], [place], [place] [address]    Report Status PENDING  Incomplete  Blood culture (routine x 2)     Status: None (Preliminary result)   Collection Time: 12/16/2018 11:13 AM   Specimen: BLOOD  Result Value Ref Range Status   Specimen Description BLOOD LEFT ANTECUBITAL  Final   Special Requests   Final    BOTTLES DRAWN AEROBIC AND ANAEROBIC Blood Culture adequate volume   Culture   Final    NO GROWTH 2 DAYS Performed at [facility], [address], [place], [place] [address]    Report Status PENDING  Incomplete  SARS CORONAVIRUS 2 (TAT 6-24 HRS) Nasopharyngeal Nasopharyngeal Swab     Status: None   Collection Time: 12/06/2018  1:19 PM   Specimen: Nasopharyngeal Swab  Result Value Ref Range Status   SARS Coronavirus 2 NEGATIVE NEGATIVE Final    Comment: (NOTE) SARS-CoV-2 target nucleic acids are NOT DETECTED. The SARS-CoV-2 RNA is generally detectable in upper and lower respiratory specimens during the acute phase of infection. Negative results do not preclude SARS-CoV-2 infection, do not rule out co-infections with other pathogens, and should not be used as the sole basis for treatment or other patient management decisions. Negative results must be combined with clinical observations, patient history, and epidemiological information. The expected result is Negative. Fact Sheet for Patients: SugarRoll.be Fact Sheet for Healthcare Providers: https://www.woods-mathews.com/ This test is not yet approved or cleared by the [place] FDA and  has been authorized for detection and/or diagnosis of SARS-CoV-2 by FDA under an Emergency Use Authorization (EUA). This EUA will remain  in effect (meaning this test can be used) for the duration of the COVID-19 declaration under Section 56 4(b)(1) of  the Act, 21 U.S.C. section 360bbb-3(b)(1), unless the  authorization is terminated or revoked sooner. Performed at [facility], [place] [address], [place], [place] [address]      Radiology Studies: [place] Ascites (abdomen Limited)  Result Date: 12/30/2018 INDICATION: Ascites. EXAM: ULTRASOUND GUIDED PARACENTESIS MEDICATIONS: None. COMPLICATIONS: None immediate. PROCEDURE: Informed written consent was obtained from the patient after a discussion of the risks, benefits and alternatives to treatment. A timeout was performed prior to the initiation of the procedure. Ultrasound reveals a mild amount of ascites. Given the mild amount of ascites and given patient's low platelet count, it was elected to not perform paracentesis at this time. FINDINGS: Mild amount of ascites. Given patient's low platelet count it was not deemed safe to perform paracentesis at this time. Follow-up exam can be obtained if symptoms worsens. IMPRESSION: Mild amount of ascites. Given patient's low platelet count it was not deemed safe to perform paracentesis this time. Electronically Signed   By: [place]   On: 12/30/2018 14:37    Scheduled Meds: . Chlorhexidine Gluconate Cloth  6 each Topical Daily  . cholecalciferol  2,000 Units Oral QPM  . dexamethasone  8 mg Oral Q8H  . diphenhydrAMINE  50 mg Oral Once   Or  . diphenhydrAMINE  50 mg Intravenous Once  . feeding supplement (ENSURE ENLIVE)  237 mL Oral BID BM  . fentaNYL  1 patch Transdermal Q72H  . hydrocortisone sod succinate (SOLU-CORTEF) inj  200 mg Intravenous Once  . insulin aspart  0-9 Units Subcutaneous TID WC  . losartan  50 mg Oral Daily  . multivitamin with minerals  1 tablet Oral Daily  . omega-3 acid ethyl esters  1,000 mg Oral QPM  . pantoprazole  40 mg Oral Daily  . polyethylene glycol  17 g Oral Daily  . sodium chloride flush  10 mL Intravenous q morning - 10a  . sucralfate  1 g Oral TID WC & HS   Continuous Infusions: . sodium chloride  Stopped (12/31/18 0101)  . cefTRIAXone (ROCEPHIN)  IV       LOS: 2 days   Time spent: 45 minutes.   Lorella Nimrod, MD [facility] Pager [phone]  If 7PM-7AM, please contact night-coverage [email or web address] Password [facility] 12/31/2018, 2:28 PM   This record has been created using Dragon voice recognition software. Errors have been sought and corrected,but may not always be located. Such creation errors do not reflect on the standard of care.

## 2018-12-31 NOTE — Progress Notes (Signed)
Patient currently in patient but states she hopes to make it to her treatment tomorrow.

## 2018-12-31 DEATH — deceased

## 2019-01-01 ENCOUNTER — Inpatient Hospital Stay: Payer: No Typology Code available for payment source | Admitting: Oncology

## 2019-01-01 ENCOUNTER — Inpatient Hospital Stay: Payer: No Typology Code available for payment source

## 2019-01-01 ENCOUNTER — Other Ambulatory Visit: Payer: Self-pay | Admitting: Oncology

## 2019-01-01 DIAGNOSIS — C50919 Malignant neoplasm of unspecified site of unspecified female breast: Secondary | ICD-10-CM | POA: Diagnosis not present

## 2019-01-01 DIAGNOSIS — Z881 Allergy status to other antibiotic agents status: Secondary | ICD-10-CM

## 2019-01-01 DIAGNOSIS — J9811 Atelectasis: Secondary | ICD-10-CM

## 2019-01-01 DIAGNOSIS — Z1612 Extended spectrum beta lactamase (ESBL) resistance: Secondary | ICD-10-CM

## 2019-01-01 DIAGNOSIS — R161 Splenomegaly, not elsewhere classified: Secondary | ICD-10-CM

## 2019-01-01 DIAGNOSIS — Z91010 Allergy to peanuts: Secondary | ICD-10-CM

## 2019-01-01 DIAGNOSIS — Z91013 Allergy to seafood: Secondary | ICD-10-CM

## 2019-01-01 DIAGNOSIS — R1084 Generalized abdominal pain: Secondary | ICD-10-CM

## 2019-01-01 DIAGNOSIS — Z515 Encounter for palliative care: Secondary | ICD-10-CM | POA: Diagnosis not present

## 2019-01-01 DIAGNOSIS — B961 Klebsiella pneumoniae [K. pneumoniae] as the cause of diseases classified elsewhere: Secondary | ICD-10-CM | POA: Diagnosis not present

## 2019-01-01 DIAGNOSIS — C787 Secondary malignant neoplasm of liver and intrahepatic bile duct: Secondary | ICD-10-CM

## 2019-01-01 DIAGNOSIS — Z885 Allergy status to narcotic agent status: Secondary | ICD-10-CM

## 2019-01-01 DIAGNOSIS — Z91048 Other nonmedicinal substance allergy status: Secondary | ICD-10-CM

## 2019-01-01 DIAGNOSIS — N390 Urinary tract infection, site not specified: Secondary | ICD-10-CM | POA: Diagnosis not present

## 2019-01-01 DIAGNOSIS — D696 Thrombocytopenia, unspecified: Secondary | ICD-10-CM | POA: Diagnosis not present

## 2019-01-01 DIAGNOSIS — R188 Other ascites: Secondary | ICD-10-CM

## 2019-01-01 DIAGNOSIS — Z9221 Personal history of antineoplastic chemotherapy: Secondary | ICD-10-CM

## 2019-01-01 DIAGNOSIS — C50912 Malignant neoplasm of unspecified site of left female breast: Secondary | ICD-10-CM

## 2019-01-01 DIAGNOSIS — R109 Unspecified abdominal pain: Secondary | ICD-10-CM | POA: Diagnosis not present

## 2019-01-01 DIAGNOSIS — J9601 Acute respiratory failure with hypoxia: Secondary | ICD-10-CM

## 2019-01-01 LAB — GLUCOSE, CAPILLARY
Glucose-Capillary: 102 mg/dL — ABNORMAL HIGH (ref 70–99)
Glucose-Capillary: 151 mg/dL — ABNORMAL HIGH (ref 70–99)
Glucose-Capillary: 160 mg/dL — ABNORMAL HIGH (ref 70–99)
Glucose-Capillary: 199 mg/dL — ABNORMAL HIGH (ref 70–99)

## 2019-01-01 LAB — CBC
HCT: 35.8 % — ABNORMAL LOW (ref 36.0–46.0)
Hemoglobin: 12.6 g/dL (ref 12.0–15.0)
MCH: 32 pg (ref 26.0–34.0)
MCHC: 35.2 g/dL (ref 30.0–36.0)
MCV: 90.9 fL (ref 80.0–100.0)
Platelets: 35 10*3/uL — ABNORMAL LOW (ref 150–400)
RBC: 3.94 MIL/uL (ref 3.87–5.11)
RDW: 17.4 % — ABNORMAL HIGH (ref 11.5–15.5)
WBC: 13.6 10*3/uL — ABNORMAL HIGH (ref 4.0–10.5)
nRBC: 0 % (ref 0.0–0.2)

## 2019-01-01 MED ORDER — SIMETHICONE 80 MG PO CHEW
80.0000 mg | CHEWABLE_TABLET | Freq: Four times a day (QID) | ORAL | Status: DC | PRN
  Administered 2019-01-01: 80 mg via ORAL
  Filled 2019-01-01 (×2): qty 1

## 2019-01-01 MED ORDER — SULFAMETHOXAZOLE-TRIMETHOPRIM 800-160 MG PO TABS: 1.0000 | ORAL_TABLET | Freq: Two times a day (BID) | ORAL | Status: DC

## 2019-01-01 MED ORDER — SIMETHICONE 80 MG PO CHEW
80.0000 mg | CHEWABLE_TABLET | Freq: Four times a day (QID) | ORAL | Status: DC | PRN
  Filled 2019-01-01: qty 1

## 2019-01-01 MED ORDER — CIPROFLOXACIN HCL 500 MG PO TABS
500.0000 mg | ORAL_TABLET | Freq: Two times a day (BID) | ORAL | Status: DC
  Administered 2019-01-01 – 2019-01-03 (×5): 500 mg via ORAL
  Filled 2019-01-01 (×7): qty 1

## 2019-01-01 MED ORDER — SODIUM CHLORIDE 0.9 % IV SOLN: 1.0000 g | INTRAVENOUS | Status: DC

## 2019-01-01 NOTE — Progress Notes (Signed)
[place]  [phone] [phone]  ID: Shelby [name] OB: 1970/02/26  MR#: [id number]  ZFP#:[id number]  Patient Care Team: Jerrol Banana., MD as PCP - General (Family Medicine) [name], MD as Consulting Physician (Cardiology) Alfonzo Feller, RN as [facility]  CHIEF COMPLAINT: Stage IV breast cancer with liver metastasis.  INTERVAL HISTORY: Patient continues to have abdominal pain and swelling, but this is mildly improved.  She has worsening peripheral edema.  She has shortness of breath particularly with walking.  She continues to have significant anxiety.  REVIEW OF SYSTEMS:   Review of Systems  Constitutional: Positive for malaise/fatigue. Negative for fever and weight loss.  Respiratory: Positive for shortness of breath. Negative for cough.   Cardiovascular: Positive for leg swelling. Negative for chest pain.  Gastrointestinal: Positive for abdominal pain.  Genitourinary: Negative.  Negative for dysuria.  Musculoskeletal: Negative.  Negative for back pain.  Skin: Negative.  Negative for rash.  Neurological: Positive for weakness. Negative for dizziness, focal weakness and headaches.  Endo/Heme/Allergies: Does not bruise/bleed easily.  Psychiatric/Behavioral: The patient is nervous/anxious.     As per HPI. Otherwise, a complete review of systems is negative.  PAST MEDICAL HISTORY: Past Medical History:  Diagnosis Date   Anemia    h/o with pregnancy   Anxiety    Asthma    allergy induced-no inhaler   Cancer ([place])    breast left    Complication of anesthesia    Environmental allergies    Family history of adverse reaction to anesthesia    son-breathing problems-coded 1st time when he was 2 and had another surgery at 48 and had to be admitted for breathing problems   Family history of breast cancer    GERD (gastroesophageal reflux disease)    Headache    migraines     Hypertension    Mitral valve disease    Mitral valve prolapse    Painful menstrual periods    PONV (postoperative nausea and vomiting)    Vertigo     PAST SURGICAL HISTORY: Past Surgical History:  Procedure Laterality Date   ABDOMINAL HYSTERECTOMY  2010   supracervical    AXILLARY LYMPH NODE DISSECTION Left 07/12/2018   Procedure: AXILLARY LYMPH NODE DISSECTION;  Surgeon: Robert Bellow, MD;  Location: [facility];  Service: General;  Laterality: Left;   BREAST BIOPSY Left 11/2017   invasive ductal carcinoma and metastatic LN   BREAST BIOPSY WITH SENTINEL LYMPH NODE BIOPSY AND NEEDLE LOCALIZATION Left 07/12/2018   Procedure: BREAST BIOPSY WIDE EXCISION WITH SENTINEL NODE  AND NEEDLE LOCALIZATION OF [name] LEFT;  Surgeon: Robert Bellow, MD;  Location: [facility];  Service: General;  Laterality: Left;   BUNIONECTOMY Bilateral    MANDIBLE RECONSTRUCTION     age 25-underbite   PORTACATH PLACEMENT Right 12/31/2017   Procedure: INSERTION PORT-A-CATH;  Surgeon: Robert Bellow, MD;  Location: [facility];  Service: General;  Laterality: Right;   RE-EXCISION OF BREAST LUMPECTOMY Left 07/24/2018   Procedure: RE-EXCISION OF BREAST LUMPECTOMY LEFT;  Surgeon: Robert Bellow, MD;  Location: [facility];  Service: General;  Laterality: Left;   SHOULDER ARTHROSCOPY WITH OPEN ROTATOR CUFF REPAIR Right 04/26/2017   Procedure: SHOULDER ARTHROSCOPY WITH OPEN ROTATOR CUFF REPAIR,SUBACROMINAL DECOMPRESSION;  Surgeon: Thornton Park, MD;  Location: [facility];  Service: Orthopedics;  Laterality: Right;   TONSILLECTOMY      FAMILY HISTORY: Family History  Problem Relation Age of Onset  Breast cancer Mother 56   Diabetes Father    Cancer Maternal Uncle        colon   Breast cancer Maternal Grandmother 38    ADVANCED DIRECTIVES (Y/N):  @ADVDIR @  HEALTH MAINTENANCE: Social History   Tobacco Use   Smoking status: Former Smoker    Packs/day: 0.50    Years: 10.00     Pack years: 5.00    Types: Cigarettes    Quit date: 04/20/2007    Years since quitting: 11.7   Smokeless tobacco: Never Used   Tobacco comment: social smoker back then  Substance Use Topics   Alcohol use: Yes    Comment: occas   Drug use: No     Colonoscopy:  PAP:  Bone density:  Lipid panel:  Allergies  Allergen Reactions   Hydrocodone Hives    Swollen face - many years ago. Thinks she has tolerated since.   Iodine Hives   Peanut Butter Flavor Other (See Comments)    Made mouth tingle   Shellfish Allergy Hives   Vancomycin Other (See Comments)    Itching and forehead turned red    Current Facility-Administered Medications  Medication Dose Route Frequency Provider Last Rate Last Dose   0.9 %  sodium chloride infusion   Intravenous PRN Amin, Jeanella Flattery, MD   Stopped at 12/31/18 0101   ALPRAZolam (XANAX) tablet 0.25-0.5 mg  0.25-0.5 mg Oral BID PRN Damita Lack, MD   0.5 mg at 12/30/18 1300   bisacodyl (DULCOLAX) EC tablet 5 mg  5 mg Oral Daily PRN Damita Lack, MD   5 mg at 12/31/18 0858   Chlorhexidine Gluconate Cloth 2 % PADS 6 each  6 each Topical Daily Damita Lack, MD   6 each at 01/01/19 0841   cholecalciferol (VITAMIN D) tablet 2,000 Units  2,000 Units Oral QPM Amin, Ankit Chirag, MD   2,000 Units at 01/01/19 1706   ciprofloxacin (CIPRO) tablet 500 mg  500 mg Oral BID Tsosie Billing, MD   500 mg at 01/01/19 2010   dexamethasone (DECADRON) tablet 8 mg  8 mg Oral Q8H Amin, Ankit Chirag, MD   8 mg at 01/01/19 2010   famotidine (PEPCID) tablet 20 mg  20 mg Oral Daily PRN Amin, Ankit Chirag, MD   20 mg at 12/31/18 1158   feeding supplement (ENSURE ENLIVE) (ENSURE ENLIVE) liquid 237 mL  237 mL Oral BID BM Amin, Ankit Chirag, MD   237 mL at 12/31/18 1158   fentaNYL (DURAGESIC) 25 MCG/HR 1 patch  1 patch Transdermal Q72H Lloyd Huger, MD   1 patch at 01/01/19 1254   fentaNYL (SUBLIMAZE) injection 25 mcg  25 mcg  Intravenous Q4H PRN Amin, Ankit Chirag, MD       HYDROmorphone (DILAUDID) injection 1 mg  1 mg Intravenous Q1H PRN [name] Bixby, MD   1 mg at 12/08/2018 1600   HYDROmorphone (DILAUDID) tablet 1 mg  1 mg Oral Q4H PRN Amin, Ankit Chirag, MD       insulin aspart (novoLOG) injection 0-9 Units  0-9 Units Subcutaneous TID WC Amin, Ankit Chirag, MD   2 Units at 01/01/19 1706   losartan (COZAAR) tablet 50 mg  50 mg Oral Daily Amin, Ankit Chirag, MD   50 mg at 01/01/19 0842   multivitamin with minerals tablet 1 tablet  1 tablet Oral Daily Damita Lack, MD   1 tablet at 01/01/19 0842   omega-3 acid ethyl esters (LOVAZA) capsule 1,000 mg  1,000 mg Oral QPM Amin, Ankit Chirag, MD   1,000 mg at 01/01/19 1706   ondansetron (ZOFRAN) tablet 4 mg  4 mg Oral Q6H PRN Damita Lack, MD   4 mg at 01/01/19 0539   Or   ondansetron (ZOFRAN) injection 4 mg  4 mg Intravenous Q6H PRN Amin, Ankit Chirag, MD       pantoprazole (PROTONIX) EC tablet 40 mg  40 mg Oral Daily Amin, Ankit Chirag, MD   40 mg at 01/01/19 0842   polyethylene glycol (MIRALAX / GLYCOLAX) packet 17 g  17 g Oral Daily Lorella Nimrod, MD   17 g at 12/31/18 0845   simethicone (MYLICON) chewable tablet 80 mg  80 mg Oral QID PRN Gerald Dexter, RPH   80 mg at 01/01/19 1739   sodium chloride flush (NS) 0.9 % injection 10 mL  10 mL Intravenous q morning - 10a Amin, Ankit Chirag, MD   10 mL at 01/01/19 0843   sodium chloride flush (NS) 0.9 % injection 10 mL  10 mL Intravenous PRN Amin, Ankit Chirag, MD   10 mL at 12/19/2018 1735   sucralfate (CARAFATE) tablet 1 g  1 g Oral TID WC & HS Amin, Ankit Chirag, MD   1 g at 01/01/19 2009   traMADol (ULTRAM) tablet 50 mg  50 mg Oral Q12H PRN Amin, Jeanella Flattery, MD        OBJECTIVE: Vitals:   01/01/19 1935 01/01/19 2048  BP:  (!) 127/94  Pulse:  86  Resp:  17  Temp:  97.6 F (36.4 C)  SpO2: 96% 94%     Body mass index is 22.49 kg/m.    ECOG FS:2 - Symptomatic, <50% confined to  bed  General: Well-developed, well-nourished, no acute distress. Eyes: Pink conjunctiva, anicteric sclera. HEENT: Normocephalic, moist mucous membranes. Lungs: Clear to auscultation bilaterally. Heart: Regular rate and rhythm. No rubs, murmurs, or gallops. Abdomen: Soft, nontender, nondistended. No organomegaly noted, normoactive bowel sounds. Musculoskeletal: No edema, cyanosis, or clubbing. Neuro: Alert, answering all questions appropriately. Cranial nerves grossly intact. Skin: No rashes or petechiae noted. Psych: Anxious.   LAB RESULTS:  Lab Results  Component Value Date   NA 131 (L) 12/31/2018   K 4.8 12/31/2018   CL 93 (L) 12/31/2018   CO2 27 12/31/2018   GLUCOSE 147 (H) 12/31/2018   BUN 17 12/31/2018   CREATININE 0.58 12/31/2018   CALCIUM 8.5 (L) 12/31/2018   PROT 5.5 (L) 12/31/2018   ALBUMIN 2.5 (L) 12/31/2018   AST 151 (H) 12/31/2018   ALT 88 (H) 12/31/2018   ALKPHOS 142 (H) 12/31/2018   BILITOT 1.0 12/31/2018   GFRNONAA >60 12/31/2018   GFRAA >60 12/31/2018    Lab Results  Component Value Date   WBC 13.6 (H) 01/01/2019   NEUTROABS 3.0 12/23/2018   HGB 12.6 01/01/2019   HCT 35.8 (L) 01/01/2019   MCV 90.9 01/01/2019   PLT 35 (L) 01/01/2019     STUDIES: Ct Abdomen Pelvis Wo Contrast  Addendum Date: 12/22/2018   ADDENDUM REPORT: 12/05/2018 10:16 ADDENDUM: Urinary bladder wall mildly thickened. A degree of cystitis questioned. Electronically Signed   By: Lowella Grip III M.D.   On: 12/04/2018 10:16   Result Date: 12/21/2018 CLINICAL DATA:  Abdominal pain with nausea and distention. Breast carcinoma with known liver metastases EXAM: CT ABDOMEN AND PELVIS WITHOUT CONTRAST TECHNIQUE: Multidetector CT imaging of the abdomen and pelvis was performed following the standard protocol without oral or IV contrast. COMPARISON:  December 21, 2018 FINDINGS: Lower chest: There is a right pleural effusion with atelectatic change in the right base. There is atelectatic  change in the left base with rather minimal left pleural effusion. Small pericardial effusion noted. Hepatobiliary: There is widespread metastatic disease is seen throughout the liver, better delineated on recent contrast-enhanced study. Gallbladder is largely collapsed. No biliary duct dilatation evident. Pancreas: No pancreatic mass or inflammatory focus. Spleen: No splenic lesions are evident. Adrenals/Urinary Tract: Adrenals appear unremarkable bilaterally. Kidneys bilaterally show no evident mass or hydronephrosis on either side. There is no evident renal or ureteral calculus on either side. Urinary bladder is midline. The urinary bladder wall thickness is mildly increased. Stomach/Bowel: There is moderate stool in the colon. There is no appreciable bowel wall or mesenteric thickening. No evident bowel obstruction. Terminal ileal region appears unremarkable. There is no free air or portal venous air. Vascular/Lymphatic: No abdominal aortic aneurysm. No arterial lesions seen on this noncontrast enhanced study. Multiple upper abdominal venous collaterals are again noted without change from recent study. A focal periceliac lymph node measuring 1.6 x 1.3 cm is stable. No new adenopathy evident compared to recent study. Reproductive: Uterus absent.  No pelvic mass evident. Other: There is moderate ascites, slightly increased compared to recent study. No abscess evident in the abdomen or pelvis. No periappendiceal region inflammation. Appendix not appreciable. Musculoskeletal: No lytic or destructive bone lesions are evident. A small sclerotic focus in the right sacrum may represent a small bone island. No intramuscular lesions are evident. IMPRESSION: 1.  Moderate ascites, slightly increased compared to recent study. 2.  Widespread hepatic metastatic disease. 3. No bowel obstruction. No abscess in the abdomen or pelvis. No periappendiceal region inflammation. 4.  Enlarged periceliac lymph node, suspected due to  neoplasm. 5. Venous collaterals in the upper abdomen, primarily on the left, stable. Further assessment with respect to etiology not possible without intravenous contrast. Appearance stable compared to recent contrast enhanced CT examination. 6.  Uterus absent. 7. Small pleural effusions with bibasilar atelectatic change. Small pericardial effusion. Electronically Signed: By: Lowella Grip III M.D. On: 12/09/2018 10:13   Ct Abdomen Pelvis Wo Contrast  Addendum Date: 12/18/2018   ADDENDUM REPORT: 12/18/2018 20:18 ADDENDUM: These results were called by telephone on 12/18/2018 at 8:18 pm to provider [facility] , who verbally acknowledged these results. Electronically Signed   By: Lovena Le M.D.   On: 12/18/2018 20:18   Result Date: 12/18/2018 CLINICAL DATA:  Abdominal pain and distension, undergoing chemotherapy for breast cancer, history of IBS and diverticulitis EXAM: CT ABDOMEN AND PELVIS WITHOUT CONTRAST TECHNIQUE: Multidetector CT imaging of the abdomen and pelvis was performed following the standard protocol without IV contrast. COMPARISON:  CT abdomen pelvis 01/01/2018 FINDINGS: Lower chest: The patchy areas of subpleural ground-glass opacity could reflect atelectasis or early infection. Cardiac size is normal. Trace pericardial effusion, similar to prior. Hepatobiliary: Ill-defined regions of hypoattenuation are seen the anterior left lobe liver. Slightly nodular hepatic surface contour which is new from comparison exam. These features are both incompletely assessed in the absence of contrast media. Gallbladder appears largely contracted at the time of exam. No biliary ductal dilatation. Pancreas: Unremarkable. No pancreatic ductal dilatation or surrounding inflammatory changes. Spleen: Normal in size without focal abnormality. Adrenals/Urinary Tract: No suspicious adrenal lesions. No visible or contour deforming renal lesions. No urolithiasis or hydronephrosis mild circumferential bladder wall  thickening. Stomach/Bowel: Distal esophagus, stomach and duodenal sweep are unremarkable. No small bowel wall thickening or dilatation. No evidence  of obstruction. Appendix is not clearly identified. No pericecal inflammation. There is some mild mural thickening of the splenic flexure and descending colon remaining portions of the colon are free of mural thickening or dilatation. Vascular/Lymphatic: Upper abdominal venous collateralization and splenorenal shunting is noted. No suspicious or enlarged lymph nodes in the included lymphatic chains. Reproductive: Uterus is surgically absent. No concerning adnexal lesions. Other: Small volume of low-attenuation ascites noted in the low abdomen. Small volume of perihepatic low-attenuation ascites is present as well. Musculoskeletal: Multilevel degenerative changes are present in the imaged portions of the spine. No acute osseous abnormality or suspicious osseous lesion. IMPRESSION: 1. Interval development of cirrhotic stigmata including a nodular liver surface contour and increasing upper abdominal venous collateralization. Small volume of ascites is noted as well. 2. Ill-defined regions of hypoattenuation are seen within the anterior left lobe liver, which are incompletely assessed in the absence of contrast media. Furthermore findings are worrisome in the setting of known malignancy and potential intrinsic liver disease. Consider further evaluation with MRI or ultrasound if patient is unable to tolerate CT contrast due to allergy. 3. Mild mural thickening of the splenic flexure and descending colon, could reflect change related to the additional cirrhotic findings/portal colopathy though should exclude a an acute colitis clinically. 4. Patchy areas of subpleural ground-glass opacity in the visualized lung bases could reflect atelectasis or early infection. 5. Mild circumferential bladder wall thickening, which may be related to underdistention. Correlate with urinalysis  to exclude cystitis. 6. Unchanged trace pericardial effusion. Electronically Signed: By: Lovena Le M.D. On: 12/18/2018 20:07   Dg Chest 1 View  Result Date: 12/23/2018 CLINICAL DATA:  Cirrhosis, ascites, SIRS, history LEFT breast cancer, hypertension EXAM: CHEST  1 VIEW COMPARISON:  Portable exam 1016 hours compared to 12/21/2018 FINDINGS: RIGHT subclavian Port-A-Cath with tip projecting over superior RIGHT atrium, unchanged. Normal heart size, mediastinal contours, and pulmonary vascularity. Bibasilar atelectasis. Remaining lungs clear. No pleural effusion or pneumothorax. IMPRESSION: Bibasilar atelectasis. Electronically Signed   By: Lavonia Dana M.D.   On: 12/23/2018 10:58   Ct Angio Chest Pe W Or Wo Contrast  Result Date: 12/31/2018 CLINICAL DATA:  Hypoxemia EXAM: CT ANGIOGRAPHY CHEST WITH CONTRAST TECHNIQUE: Multidetector CT imaging of the chest was performed using the standard protocol during bolus administration of intravenous contrast. Multiplanar CT image reconstructions and MIPs were obtained to evaluate the vascular anatomy. CONTRAST:  3m OMNIPAQUE IOHEXOL 350 MG/ML SOLN COMPARISON:  None. FINDINGS: Cardiovascular: No filling defects in the pulmonary arteries to suggest pulmonary emboli. Heart is normal size. Aorta is normal caliber. Small pericardial effusion. Mediastinum/Nodes: No mediastinal, hilar, or axillary adenopathy. Trachea and esophagus are unremarkable. Thyroid unremarkable. Lungs/Pleura: Trace left effusion and small right effusion. Bilateral lower lobe airspace opacities are noted, right greater than left which could reflect atelectasis or pneumonia. There are scattered predominantly peripheral ground-glass opacities in the upper lobes which could reflect atypical/viral infection. Upper Abdomen: While not imaged in its entirety, the liver appears prominent. Recommend clinical correlation for possible hepatomegaly. Small amount of ascites surrounding the liver and spleen.  Musculoskeletal: No acute bony abnormality. Chest wall soft tissues are unremarkable. Right chest wall Port-A-Cath in place with the tip in the SVC. Review of the MIP images confirms the above findings. IMPRESSION: No evidence of pulmonary embolus. Small right effusion and trace left effusion. Small pericardial effusion. Bilateral lower lobe atelectasis or infiltrate/pneumonia. Patchy peripheral ground-glass opacities in the upper lobes could reflect atypical/viral pneumonia. COVID pneumonia cannot be excluded. Liver appears  prominent, but is not imaged in its entirety. Recommend clinical correlation for possible hepatomegaly. Perihepatic and perisplenic ascites. Electronically Signed   By: Rolm Baptise M.D.   On: 12/31/2018 19:30   Ct Angio Chest Pe W And/or Wo Contrast  Result Date: 12/21/2018 CLINICAL DATA:  48 y.o. female discharged yesterday for concerns of abdominal pain with a work-up regarding her liver as well as she is meeting SIRS criteria with feverPatient comes back today reports she continues to have significant pain and discomfort. She has been having ongoing and severe pain located in the mid to right upper abdomen, also she reports fairly significant pain when she takes a deep breath over the right lower lungShe has been feeling warm had a fever when she was in the hospital yesterday. Some nausea. No headaches. Tested negative for Covid a few days ago pain is located primarily in the right upper abdomen under the right rib cage. EXAM: CT ANGIOGRAPHY CHEST CT ABDOMEN AND PELVIS WITH CONTRAST TECHNIQUE: Multidetector CT imaging of the chest was performed using the standard protocol during bolus administration of intravenous contrast. Multiplanar CT image reconstructions and MIPs were obtained to evaluate the vascular anatomy. Multidetector CT imaging of the abdomen and pelvis was performed using the standard protocol during bolus administration of intravenous contrast. CONTRAST:  56m OMNIPAQUE  IOHEXOL 350 MG/ML SOLN COMPARISON:  Chest CTA and abdomen and pelvis CT dated 12/19/2018. FINDINGS: CTA CHEST FINDINGS Cardiovascular: There is satisfactory opacification of the pulmonary arteries to the segmental level. No evidence of a pulmonary embolism. Mediastinum/Nodes: No enlarged mediastinal, hilar, or axillary lymph nodes. Thyroid gland, trachea, and esophagus demonstrate no significant findings. Lungs/Pleura: Small bilateral pleural effusions. There is dependent opacity in the lower lobes consistent with atelectasis. Small areas of ground-glass opacity and small lung nodules noted. Dominant nodule in the right upper lobe, image 46, series 4, 6 mm. Several other small nodules are new from the prior CT. Area of ground-glass opacity adjacent to the oblique fissure in the left upper lobe is new. No pneumothorax. Musculoskeletal: No chest wall abnormality. No acute or significant osseous findings. Review of the MIP images confirms the above findings. CT ABDOMEN and PELVIS FINDINGS Hepatobiliary: Enlarged liver. There are multiple hypoattenuating areas throughout the liver. These areas are more apparent and more extensive than on the recent prior CT, particularly evident at the dome of segment 4A, along the anterior aspect of the right lobe and medial segment of the left lobe and along the inferior aspect of the right lobe, segment 5 and 6. Gallbladder is mostly collapsed. There is increased attenuation material within the gallbladder similar to the prior CT. No bile duct dilation Pancreas: Unremarkable. No pancreatic ductal dilatation or surrounding inflammatory changes. Spleen: Normal in size without focal abnormality. Adrenals/Urinary Tract: No adrenal masses. Right kidney displaced inferiorly by the enlarged liver. Symmetric renal enhancement and excretion. No renal masses, stones or hydronephrosis. Normal ureters. Bladder wall is prominent, unchanged from the recent prior study. No bladder masses.  Stomach/Bowel: Stomach is unremarkable, mostly decompressed. Small bowel and colon are normal in caliber. No wall thickening or inflammation. Appendix not discretely seen. No findings of appendicitis. Vascular/Lymphatic: As noted on the prior CT, left portal vein is attenuated, but does show enhancement. No evidence of thrombosis. There are venous collaterals in the upper abdomen. Aorta is unremarkable. No enlarged lymph nodes. Prominent Peri celiac node noted on the prior study is stable. Reproductive: Status post hysterectomy. No adnexal masses. Other: Small amount of ascites increased  when compared to the prior CT. Musculoskeletal: No acute or significant osseous findings. Review of the MIP images confirms the above findings. IMPRESSION: CHEST CTA 1. No evidence of a pulmonary embolism. 2. Small pleural effusions, new from the prior CT. There is increased opacity in the lower lobes that is consistent with atelectasis. There several new small nodules and small areas of ground-glass opacity when compared to the prior CT. Suspect these are inflammatory given change from the recent exam. ABDOMEN AND PELVIS CT 1. Multiple hypoattenuating liver lesions. These appear to have progressed when compared to the prior CT, although this change could be due to differences in contrast timing. The liver is enlarged, stable from the recent exam. Findings are concerning for multifocal hepatocellular carcinoma. Follow-up liver MRI without and with contrast, when the patient can tolerate the procedure, is recommended. 2. Small amount ascites has increased in quantity from the prior study. 3. There are vascular collaterals in the upper abdomen, stable. Electronically Signed   By: Lajean Manes M.D.   On: 12/21/2018 18:36   Ct Angio Chest Pe W And/or Wo Contrast  Result Date: 12/19/2018 CLINICAL DATA:  Chest pain, complex, intermediate/high prob of ACS/PE/AAS; Abd distension Epigastric pain. Exertional dyspnea. Cough. Weakness.  History of breast cancer. EXAM: CT ANGIOGRAPHY CHEST CT ABDOMEN AND PELVIS WITH CONTRAST TECHNIQUE: Multidetector CT imaging of the chest was performed using the standard protocol during bolus administration of intravenous contrast. Multiplanar CT image reconstructions and MIPs were obtained to evaluate the vascular anatomy. Multidetector CT imaging of the abdomen and pelvis was performed using the standard protocol during bolus administration of intravenous contrast. No reported contrast reaction. CONTRAST:  16m OMNIPAQUE IOHEXOL 350 MG/ML SOLN COMPARISON:  Abdominal CT without contrast yesterday. Chest CT 01/01/2018 FINDINGS: CTA CHEST FINDINGS Cardiovascular: There are no filling defects within the pulmonary arteries to suggest pulmonary embolus. Thoracic aorta is normal in caliber without dissection. Left vertebral artery arises directly from the thoracic aorta, variant arch anatomy. Heart is normal in size. No pericardial effusion. Right chest port in place with tip in the SVC. Mediastinum/Nodes: No mediastinal or hilar adenopathy. No visualized thyroid nodule. No esophageal wall thickening. Lungs/Pleura: Mild patchy areas of subpleural nodular and ground-glass opacities. Basilar findings are unchanged from CT yesterday. 5 mm right upper lobe pulmonary nodule series 6, image 41. Sub solid nodularity slightly more inferiorly in the right lung series 6, image 45 measuring 8 mm. Tiny 2 mm nodule in the right upper lung series 6, image 32, may be fissural. Pulmonary nodules are new from prior chest CT. Perifissural in subpleural opacity in the right upper lobe, also series 6, image 32, nonspecific. No pulmonary edema. No pleural fluid. Trachea and mainstem bronchi are patent. Musculoskeletal: There are no acute or suspicious osseous abnormalities. Review of the MIP images confirms the above findings. CT ABDOMEN and PELVIS FINDINGS Hepatobiliary: Hepatic parenchyma diffusely heterogeneous with nodular contours.  Liver is increased in size from December 2019 CT. Gallbladder is decompressed and not well assessed, intraluminal high density may be stones or sludge. Questionable wall thickening may be related to nondistention. Pancreas: No ductal dilatation or inflammation. Spleen: Normal in size without focal abnormality. Adrenals/Urinary Tract: Normal adrenal glands. No hydronephrosis or perinephric edema. Homogeneous renal enhancement with symmetric excretion on delayed phase imaging. Urinary bladder is partially distended, mild bladder wall thickening. Stomach/Bowel: Lack of enteric contrast and paucity of intra-abdominal fat limits detailed bowel assessment. The stomach is nondistended. Questionable pre-pyloric gastric wall thickening. No small bowel obstruction  or inflammatory change. Appendix not discretely visualized, no pericecal inflammation to suggest appendicitis. Transverse colon is tortuous. Mild colonic wall thickening at the splenic flexure, colon is decompressed, this is not well assessed. Questionable areas of descending colonic wall thickening, for example series 11, image 61. Vascular/Lymphatic: Right portal vein is patent. The left portal vein is attenuated without evidence of thrombus. Tortuous splenic vein with prominent collaterals in the left upper quadrant and left retroperitoneum. 9 mm perigastric lymph node series 11, image 29, nonspecific. No pelvic adenopathy. Reproductive: Uterus surgically absent. Left ovary tentatively visualized and normal. Right ovary not definitively seen. No obvious adnexal mass. Other: Small volume of pelvic ascites measuring simple fluid density, similar to yesterday. Small perihepatic ascites anteriorly. No free air. Musculoskeletal: There are no acute or suspicious osseous abnormalities. Scattered bone islands in the pelvis. Review of the MIP images confirms the above findings. IMPRESSION: CT chest: 1. No pulmonary embolus. 2. Mild patchy areas of subpleural nodular and  ground-glass opacities in both lungs. Slightly more confluent subpleural/perifissural right upper lobe opacity. Findings may be due to atelectasis or infection. 3. Small pulmonary nodules are new from December, infectious/inflammatory or metastatic. CT abdomen/pelvis: 1. Diffusely heterogeneous liver with nodular contours. Liver has increased in size since December 2019 CT. This may be due to underlying cirrhotic change versus diffuse metastatic disease. Recommend further evaluation with abdominal MRI. Left upper quadrant and retroperitoneal collaterals can be seen with portal hypertension. Left portal vein is attenuated, however no discrete thrombus. 2. Questionable pre-pyloric gastric wall thickening, peptic ulcer disease versus gastritis. 3. Bladder wall thickening, small volume abdominopelvic ascites, and areas of colonic wall thickening at the splenic flexure and descending colon are unchanged from CT yesterday. 4. Gallbladder is decompressed and not well assessed, intraluminal high density may be stones or sludge. Questionable gallbladder wall thickening may be related to nondistention or liver disease. This could be further evaluated with MRI or right upper quadrant ultrasound. Electronically Signed   By: Keith Rake M.D.   On: 12/19/2018 05:33   Mr Abdomen W Wo Contrast  Result Date: 12/19/2018 CLINICAL DATA:  Abdominal pain in a patient with breast cancer. EXAM: MRI ABDOMEN WITHOUT AND WITH CONTRAST TECHNIQUE: Multiplanar multisequence MR imaging of the abdomen was performed both before and after the administration of intravenous contrast. CONTRAST:  75m GADAVIST GADOBUTROL 1 MMOL/ML IV SOLN COMPARISON:  CT of 12/19/2018 FINDINGS: Lower chest: Limited assessment of the lower chest on MRI is unremarkable. Hepatobiliary: Diffuse infiltration of much of the liver, nearly the entire left hepatic lobe, medial section and lateral section in addition to anterior right hepatic lobe with confluent  infiltrative restricted diffusion and heterogeneous enhancement measuring approximately 21 by 7.4 cm. Diffuse metastatic disease is strongly suspected. Boundaries are well-demarcated without adjacent edema in the bordering hepatic parenchyma Discrete hepatic lesions are also scattered about the right hepatic lobe largest in the dome of the right hemi liver measuring approximately 2.5 cm in greatest dimension. These discrete lesions display a targetoid appearance. There are numerous lesions in the right hepatic lobe, lesions are found in all hepatic subsegment. Pancreas: No mass, inflammatory changes, or other parenchymal abnormality identified. Mild ductal distension in the tail of the pancreas may be due to adjacent mass effect. No visible pancreatic lesion is noted. Spleen:  Within normal limits in size and appearance. Adrenals/Urinary Tract: No masses identified. No evidence of hydronephrosis. Stomach/Bowel: Limited assessment of the gastrointestinal tract without signs of acute process. Vascular/Lymphatic: Left-sided peri-renal venous collaterals of uncertain  significance perhaps related to elevated portal pressures in the setting of diffuse hepatic involvement. 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 Right portal vein is patent. Scattered small lymph nodes in the upper abdomen. There is either slow flow or thrombus within the main portal vein at the level of the splenic portal confluence extending in the splenic vein. The SMV is patent. There is mass effect upon the vessel at the level of the splenic portal confluence due to hepatic enlargement. This is best seen on subtraction images, image 54, series 20. Note that slow flow in an otherwise opacified vessel can cause artifact with a similar appearance however, this is seen on contrasted and non contrasted imaging with a similar appearance. Other:  None. Musculoskeletal: No signs of acute or destructive bone process. IMPRESSION: 1. Diffuse infiltration of much of the  liver, nearly the entire left hepatic lobe, with confluent infiltrative restricted diffusion and heterogeneous enhancement measuring approximately 21 by 7.4 cm. Diffuse metastatic disease is strongly suspected with multifocal lesions in the right hepatic lobe. Superimposed infection is difficult to exclude though areas at the boundary of these lesions show no overt signs of edema. 2. Highly narrowed or occluded left portal vein with potential thrombus versus slow flow in the splenoportal confluence. This was patent on the exam of 12/19/2018; however, given above findings a venous phase CT or even ultrasound hepatic Doppler with special attention to the splenic portal confluence may be helpful to exclude this possibility, finding is also exhibited on TrueFISP sequence (image 24, series 10). 3. 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 4. Mild ductal distension in the tail of the pancreas, potentially due to mass effect. Attention on follow-up. 5. Critical Value/emergent results were called by telephone at the time of interpretation on 12/19/2018 at 6:43 pm to Pingree , who verbally acknowledged these results. Electronically Signed   By: Zetta Bills M.D.   On: 12/19/2018 18:24   US Abdomen Complete  Result Date: 12/19/2018 CLINICAL DATA:  Abdominal pain EXAM: ABDOMEN ULTRASOUND COMPLETE COMPARISON:  MRI earlier today FINDINGS: Gallbladder: Gallbladder is contracted with associated mild wall thickening measuring up to 4 mm. Sludge noted within the gallbladder. No visible stones. Common bile duct: Diameter: Normal caliber, 3 mm Liver: Diffusely heterogeneous echotexture throughout the liver corresponding to the abnormality seen on today's MRI suspicious forinfiltrating mass. Main portal vein is patent on color Doppler imaging with normal direction of blood flow towards the liver. The area of possible  thrombus seen on MRI at the level of the splenic portal confluence not visualized. IVC: No abnormality visualized. Pancreas: Visualized portion unremarkable. Spleen: Size and appearance within normal limits. Right Kidney: Length: 11.0 cm. Echogenicity within normal limits. No mass or hydronephrosis visualized. Left Kidney: Length: 10.5 cm. Echogenicity within normal limits. No mass or hydronephrosis visualized. Abdominal aorta: No aneurysm visualized. Other findings: None. IMPRESSION: Heterogeneous appearance throughout much of the left hepatic lobe as seen on today's MRI concerning for infiltrating mass/tumor. Main portal vein is patent. Contracted gallbladder with gallbladder wall thickening likely related to contracted state. Sludge within the gallbladder. No sonographic Murphy sign or visible stones. Electronically Signed   By: Rolm Baptise M.D.   On: 12/19/2018 20:35   Ct Abdomen Pelvis W Contrast  Result Date: 12/21/2018 CLINICAL DATA:  48 y.o. female discharged yesterday for concerns of abdominal pain with a work-up regarding her liver as well as she is meeting SIRS  criteria with feverPatient comes back today reports she continues to have significant pain and discomfort. She has been having ongoing and severe pain located in the mid to right upper abdomen, also she reports fairly significant pain when she takes a deep breath over the right lower lungShe has been feeling warm had a fever when she was in the hospital yesterday. Some nausea. No headaches. Tested negative for Covid a few days ago pain is located primarily in the right upper abdomen under the right rib cage. EXAM: CT ANGIOGRAPHY CHEST CT ABDOMEN AND PELVIS WITH CONTRAST TECHNIQUE: Multidetector CT imaging of the chest was performed using the standard protocol during bolus administration of intravenous contrast. Multiplanar CT image reconstructions and MIPs were obtained to evaluate the vascular anatomy. Multidetector CT imaging of the abdomen  and pelvis was performed using the standard protocol during bolus administration of intravenous contrast. CONTRAST:  26m OMNIPAQUE IOHEXOL 350 MG/ML SOLN COMPARISON:  Chest CTA and abdomen and pelvis CT dated 12/19/2018. FINDINGS: CTA CHEST FINDINGS Cardiovascular: There is satisfactory opacification of the pulmonary arteries to the segmental level. No evidence of a pulmonary embolism. Mediastinum/Nodes: No enlarged mediastinal, hilar, or axillary lymph nodes. Thyroid gland, trachea, and esophagus demonstrate no significant findings. Lungs/Pleura: Small bilateral pleural effusions. There is dependent opacity in the lower lobes consistent with atelectasis. Small areas of ground-glass opacity and small lung nodules noted. Dominant nodule in the right upper lobe, image 46, series 4, 6 mm. Several other small nodules are new from the prior CT. Area of ground-glass opacity adjacent to the oblique fissure in the left upper lobe is new. No pneumothorax. Musculoskeletal: No chest wall abnormality. No acute or significant osseous findings. Review of the MIP images confirms the above findings. CT ABDOMEN and PELVIS FINDINGS Hepatobiliary: Enlarged liver. There are multiple hypoattenuating areas throughout the liver. These areas are more apparent and more extensive than on the recent prior CT, particularly evident at the dome of segment 4A, along the anterior aspect of the right lobe and medial segment of the left lobe and along the inferior aspect of the right lobe, segment 5 and 6. Gallbladder is mostly collapsed. There is increased attenuation material within the gallbladder similar to the prior CT. No bile duct dilation Pancreas: Unremarkable. No pancreatic ductal dilatation or surrounding inflammatory changes. Spleen: Normal in size without focal abnormality. Adrenals/Urinary Tract: No adrenal masses. Right kidney displaced inferiorly by the enlarged liver. Symmetric renal enhancement and excretion. No renal masses,  stones or hydronephrosis. Normal ureters. Bladder wall is prominent, unchanged from the recent prior study. No bladder masses. Stomach/Bowel: Stomach is unremarkable, mostly decompressed. Small bowel and colon are normal in caliber. No wall thickening or inflammation. Appendix not discretely seen. No findings of appendicitis. Vascular/Lymphatic: As noted on the prior CT, left portal vein is attenuated, but does show enhancement. No evidence of thrombosis. There are venous collaterals in the upper abdomen. Aorta is unremarkable. No enlarged lymph nodes. Prominent Peri celiac node noted on the prior study is stable. Reproductive: Status post hysterectomy. No adnexal masses. Other: Small amount of ascites increased when compared to the prior CT. Musculoskeletal: No acute or significant osseous findings. Review of the MIP images confirms the above findings. IMPRESSION: CHEST CTA 1. No evidence of a pulmonary embolism. 2. Small pleural effusions, new from the prior CT. There is increased opacity in the lower lobes that is consistent with atelectasis. There several new small nodules and small areas of ground-glass opacity when compared to the prior CT. Suspect  these are inflammatory given change from the recent exam. ABDOMEN AND PELVIS CT 1. Multiple hypoattenuating liver lesions. These appear to have progressed when compared to the prior CT, although this change could be due to differences in contrast timing. The liver is enlarged, stable from the recent exam. Findings are concerning for multifocal hepatocellular carcinoma. Follow-up liver MRI without and with contrast, when the patient can tolerate the procedure, is recommended. 2. Small amount ascites has increased in quantity from the prior study. 3. There are vascular collaterals in the upper abdomen, stable. Electronically Signed   By: Lajean Manes M.D.   On: 12/21/2018 18:36   Ct Abdomen Pelvis W Contrast  Result Date: 12/19/2018 CLINICAL DATA:  Chest pain,  complex, intermediate/high prob of ACS/PE/AAS; Abd distension Epigastric pain. Exertional dyspnea. Cough. Weakness. History of breast cancer. EXAM: CT ANGIOGRAPHY CHEST CT ABDOMEN AND PELVIS WITH CONTRAST TECHNIQUE: Multidetector CT imaging of the chest was performed using the standard protocol during bolus administration of intravenous contrast. Multiplanar CT image reconstructions and MIPs were obtained to evaluate the vascular anatomy. Multidetector CT imaging of the abdomen and pelvis was performed using the standard protocol during bolus administration of intravenous contrast. No reported contrast reaction. CONTRAST:  180m OMNIPAQUE IOHEXOL 350 MG/ML SOLN COMPARISON:  Abdominal CT without contrast yesterday. Chest CT 01/01/2018 FINDINGS: CTA CHEST FINDINGS Cardiovascular: There are no filling defects within the pulmonary arteries to suggest pulmonary embolus. Thoracic aorta is normal in caliber without dissection. Left vertebral artery arises directly from the thoracic aorta, variant arch anatomy. Heart is normal in size. No pericardial effusion. Right chest port in place with tip in the SVC. Mediastinum/Nodes: No mediastinal or hilar adenopathy. No visualized thyroid nodule. No esophageal wall thickening. Lungs/Pleura: Mild patchy areas of subpleural nodular and ground-glass opacities. Basilar findings are unchanged from CT yesterday. 5 mm right upper lobe pulmonary nodule series 6, image 41. Sub solid nodularity slightly more inferiorly in the right lung series 6, image 45 measuring 8 mm. Tiny 2 mm nodule in the right upper lung series 6, image 32, may be fissural. Pulmonary nodules are new from prior chest CT. Perifissural in subpleural opacity in the right upper lobe, also series 6, image 32, nonspecific. No pulmonary edema. No pleural fluid. Trachea and mainstem bronchi are patent. Musculoskeletal: There are no acute or suspicious osseous abnormalities. Review of the MIP images confirms the above  findings. CT ABDOMEN and PELVIS FINDINGS Hepatobiliary: Hepatic parenchyma diffusely heterogeneous with nodular contours. Liver is increased in size from December 2019 CT. Gallbladder is decompressed and not well assessed, intraluminal high density may be stones or sludge. Questionable wall thickening may be related to nondistention. Pancreas: No ductal dilatation or inflammation. Spleen: Normal in size without focal abnormality. Adrenals/Urinary Tract: Normal adrenal glands. No hydronephrosis or perinephric edema. Homogeneous renal enhancement with symmetric excretion on delayed phase imaging. Urinary bladder is partially distended, mild bladder wall thickening. Stomach/Bowel: Lack of enteric contrast and paucity of intra-abdominal fat limits detailed bowel assessment. The stomach is nondistended. Questionable pre-pyloric gastric wall thickening. No small bowel obstruction or inflammatory change. Appendix not discretely visualized, no pericecal inflammation to suggest appendicitis. Transverse colon is tortuous. Mild colonic wall thickening at the splenic flexure, colon is decompressed, this is not well assessed. Questionable areas of descending colonic wall thickening, for example series 11, image 61. Vascular/Lymphatic: Right portal vein is patent. The left portal vein is attenuated without evidence of thrombus. Tortuous splenic vein with prominent collaterals in the left upper quadrant and left  retroperitoneum. 9 mm perigastric lymph node series 11, image 29, nonspecific. No pelvic adenopathy. Reproductive: Uterus surgically absent. Left ovary tentatively visualized and normal. Right ovary not definitively seen. No obvious adnexal mass. Other: Small volume of pelvic ascites measuring simple fluid density, similar to yesterday. Small perihepatic ascites anteriorly. No free air. Musculoskeletal: There are no acute or suspicious osseous abnormalities. Scattered bone islands in the pelvis. Review of the MIP images  confirms the above findings. IMPRESSION: CT chest: 1. No pulmonary embolus. 2. Mild patchy areas of subpleural nodular and ground-glass opacities in both lungs. Slightly more confluent subpleural/perifissural right upper lobe opacity. Findings may be due to atelectasis or infection. 3. Small pulmonary nodules are new from December, infectious/inflammatory or metastatic. CT abdomen/pelvis: 1. Diffusely heterogeneous liver with nodular contours. Liver has increased in size since December 2019 CT. This may be due to underlying cirrhotic change versus diffuse metastatic disease. Recommend further evaluation with abdominal MRI. Left upper quadrant and retroperitoneal collaterals can be seen with portal hypertension. Left portal vein is attenuated, however no discrete thrombus. 2. Questionable pre-pyloric gastric wall thickening, peptic ulcer disease versus gastritis. 3. Bladder wall thickening, small volume abdominopelvic ascites, and areas of colonic wall thickening at the splenic flexure and descending colon are unchanged from CT yesterday. 4. Gallbladder is decompressed and not well assessed, intraluminal high density may be stones or sludge. Questionable gallbladder wall thickening may be related to nondistention or liver disease. This could be further evaluated with MRI or right upper quadrant ultrasound. Electronically Signed   By: Keith Rake M.D.   On: 12/19/2018 05:33   US Biopsy (liver)  Result Date: 12/20/2018 INDICATION: History of left breast carcinoma with imaging evidence of probable diffuse metastatic disease in the liver, more prominently in the left lobe. The patient presents for biopsy. EXAM: ULTRASOUND GUIDED CORE BIOPSY OF LIVER MEDICATIONS: None. ANESTHESIA/SEDATION: Fentanyl 100 mcg IV; Versed 2.0 mg IV Moderate Sedation Time:  20 minutes. The patient was continuously monitored during the procedure by the interventional radiology nurse under my direct supervision. PROCEDURE: The  procedure, risks, benefits, and alternatives were explained to the patient. Questions regarding the procedure were encouraged and answered. The patient understands and consents to the procedure. A time-out was performed prior to initiating the procedure. Ultrasound was used to localize lesions within the liver. The anterior abdominal wall was prepped with chlorhexidine in a sterile fashion, and a sterile drape was applied covering the operative field. A sterile gown and sterile gloves were used for the procedure. Local anesthesia was provided with 1% Lidocaine. Under direct ultrasound guidance, a 17 gauge trocar needle was advanced into the left lobe of the liver. After confirming needle tip position, coaxial 18 gauge core biopsy samples were obtained. A total of 3 samples were obtained and submitted in formalin. A slurry of Gel-Foam pledgets was then slowly injected via the outer needle as the needle was retracted. Additional ultrasound was performed. COMPLICATIONS: None immediate. FINDINGS: Ultrasound demonstrates heterogeneous appearance of the liver with ill-defined lesions throughout the left lobe. Solid core biopsy samples were obtained by sampling lesions within the lateral segment of the left lobe. IMPRESSION: Ultrasound-guided core biopsy performed of hepatic lesions within the left lobe. Electronically Signed   By: Aletta Edouard M.D.   On: 12/20/2018 13:44   Dg Chest Portable 1 View  Result Date: 12/21/2018 CLINICAL DATA:  Pleuritic right chest pain EXAM: PORTABLE CHEST 1 VIEW COMPARISON:  12/18/2018 chest radiograph. FINDINGS: Stable right subclavian Port-A-Cath terminating over the right  atrium. Stable cardiomediastinal silhouette with normal heart size. No pneumothorax. No pleural effusion. No pulmonary edema. Curvilinear left lung base opacities. No acute consolidative airspace disease. IMPRESSION: Curvilinear left lung base opacities, favor scarring or atelectasis. No acute consolidative  airspace disease. Electronically Signed   By: Ilona Sorrel M.D.   On: 12/21/2018 13:06   Dg Chest Portable 1 View  Result Date: 12/18/2018 CLINICAL DATA:  Fever abdominal pain EXAM: PORTABLE CHEST 1 VIEW COMPARISON:  12/31/2017 FINDINGS: Right-sided central venous port with tip over the cavoatrial region. No focal airspace disease or effusion. Normal heart size. No pneumothorax. IMPRESSION: No active disease. Electronically Signed   By: Donavan Foil M.D.   On: 12/18/2018 19:49   [place] Ascites (abdomen Limited)  Result Date: 12/30/2018 INDICATION: Ascites. EXAM: ULTRASOUND GUIDED PARACENTESIS MEDICATIONS: None. COMPLICATIONS: None immediate. PROCEDURE: Informed written consent was obtained from the patient after a discussion of the risks, benefits and alternatives to treatment. A timeout was performed prior to the initiation of the procedure. Ultrasound reveals a mild amount of ascites. Given the mild amount of ascites and given patient's low platelet count, it was elected to not perform paracentesis at this time. FINDINGS: Mild amount of ascites. Given patient's low platelet count it was not deemed safe to perform paracentesis at this time. Follow-up exam can be obtained if symptoms worsens. IMPRESSION: Mild amount of ascites. Given patient's low platelet count it was not deemed safe to perform paracentesis this time. Electronically Signed   By: Marcello Moores  Register   On: 12/30/2018 14:37    ASSESSMENT: Stage IV breast cancer with liver metastasis.  PLAN:    1.  Stage IV triple negative breast cancer: Recent liver biopsy confirmed diagnosis.  CT scan results from December 29, 2018 with no obvious progression of disease from 1 week prior.  Despite patient's significant thrombocytopenia which puts her at significant risk for bleeding, she wishes to pursue palliative chemotherapy with [place].  Will dose reduced given her liver dysfunction.  Patient acknowledged that pursuing chemotherapy with her  significant thrombocytopenia could possibly make things worse, but she does not wish to pursue hospice at this time.  Proceed with cycle 1, day 1 on January 02, 2019.  Appreciate palliative care input. 2.  Abdominal pain: Likely related to malignancy, appreciate ID input.  Consider MRI of the liver. 3.  Elevated white blood cell count: Resolved. 4.  Thrombocytopenia: Proceed cautiously with treatment above.  Monitor daily CBC.   Patient expressed understanding and was in agreement with this plan. She also understands that She can call clinic at any time with any questions, concerns, or complaints.   Cancer Staging Malignant neoplasm of female breast [facility]) Staging form: Breast, AJCC 8th Edition - Clinical stage from 12/24/2017: Stage IIIB (cT2, cN1, cM0, G3, ER-, PR-, HER2-) - Signed by Lloyd Huger, MD on 12/24/2017 - Pathologic stage from 08/01/2018: No Stage Recommended (ypT1c, pN1a, cM0, ER-, PR-, HER2-) - Signed by Lloyd Huger, MD on 08/01/2018   Lloyd Huger, MD   01/01/2019 9:41 PM

## 2019-01-01 NOTE — Progress Notes (Signed)
PROGRESS NOTE    Shelby [name]  [id number] DOB: 1970-12-07 DOA: 12/25/2018 PCP: Jerrol Banana., MD      Brief Narrative:  Shelby [name] is a 48 y.o. F with metastatic triple negative breast cancer, HTN who presents with abdominal pain, distention and nausea.  Patient was admitted here few days ago and discharged on 11/27.After going home she continued report of mid abdominal bloating, fullness and pain with poor appetite. This continued to progress therefore came back to the hospital may be subjective chills but denies any fevers and other complaints.  During her prior admission she was having some fever and it was thought to be secondary to tumor burden and she was started on steroids.  During this readmission she was having leukocytosis, lactic acidosis with abdominal pain and ascites.  CT abdomen with widespread metastatic disease to liver.  Due to her worsening pain it was thought to be due to SBP and she was started on Zosyn.  Urine culture grew 10,000 colonies of Klebsiella with ESBL positive.  It was resistant to Zosyn and antibiotics switched to ceftriaxone.        Assessment & Plan:  Possible sepsis, possible SBP Moderate malignant ascites SBP is possible, paracentesis attempted for diagnostic purposes, but not enough fluid, and platelets too low to attempt to safely.  Urine culture growing Klebsiella oxytocin, ESBL. -Consult ID -Defer further antibiotics to ID -Follow blood culture -Continue decadron -Discussed imaging for portal vein obstruction with ID and Onc, also with Radiology who reviewed her images and recommended CT abd with contrast rather than MRA   Acute hypoxic respiratory failure Required last night showed no PE.  There was some minimal groundglass opacities, doubt Covid, possible atypical pneumonia.  More peripheral, doubt pulmonary edema. -Check BNP  Metastatic breast cancer, triple negative, stage IV -Consult oncology, the  plan chemotherapy tomorrow  Anemia of chronic disease Thrombocytopenia related to chemo Platelets 30-40K, stable -Trend CBC  Hypertension Controlled -Continue losartan  Anxiety -Continue Xanax as needed         MDM and disposition: The below labs and imaging reports were reviewed and summarized above.  Medication management as above.  The patient was admitted with malignant acites, uncontrolled pain, and possible sepsis from SBP.  She now has hypoxic respiratory failure from unknown cause.  Her O2 requirements remain at 4L per iminute.         DVT prophylaxis: Lovenox Code Status: FULL Family Communication:     Consultants:   Oncology  ID  Procedures:     Antimicrobials:       Subjective: Still with abdominal pain, still dyspneic.  No orthopnea, swelling.  No confusion.   Objective: Vitals:   01/01/19 1300 01/01/19 1302 01/01/19 1314 01/01/19 1702  BP:   (!) 122/99 (!) 124/96  Pulse:   91 95  Resp:   20 18  Temp:   97.6 F (36.4 C) 97.8 F (36.6 C)  TempSrc:   Oral Oral  SpO2: (!) 72% 93% 96% 96%  Weight:      Height:        Intake/Output Summary (Last 24 hours) at 01/01/2019 1732 Last data filed at 12/31/2018 1813 Gross per 24 hour  Intake 100 ml  Output -  Net 100 ml   Filed Weights   12/27/2018 0855 12/31/18 0500  Weight: 64.9 kg 63.2 kg    Examination: General appearance:  adult female, alert and in no acute distress.   HEENT: Anicteric, conjunctiva pink, lids  and lashes normal. No nasal deformity, discharge, epistaxis.  Lips moist.   Skin: Warm and dry.  no jaundice.  No suspicious rashes or lesions. Cardiac: RRR, nl S1-S2, no murmurs appreciated.  Capillary refill is brisk.  JVP not visible.  No LE edema.  Radial  pulses 2+ and symmetric. Respiratory: Tachypneic, diminished bilaterally, no wheezing Abdomen: Abdomen soft.  Moderate TTP mostly right side.Some ascites.  Liver is massive.  MSK: No deformities or effusions. Neuro:  Awake and alert.  EOMI, moves all extremities. Speech fluent.    Psych: Sensorium intact and responding to questions, attention normal. Affect normal.  Judgment and insight appear normal.    Data Reviewed: I have personally reviewed following labs and imaging studies:  CBC: Recent Labs  Lab 12/24/2018 0919 12/12/2018 1729 12/30/18 0530 12/31/18 0514 01/01/19 0528  WBC 12.5*  --  10.5 9.8 13.6*  HGB 12.2  --  12.1 12.2 12.6  HCT 34.8* 34.8 36.3 34.3* 35.8*  MCV 90.6  --  93.6 90.0 90.9  PLT 40*  --  39* 34* 35*   Basic Metabolic Panel: Recent Labs  Lab 12/28/2018 0919 12/30/18 0530 12/31/18 0514  NA 133* 131* 131*  K 4.2 4.6 4.8  CL 93* 93* 93*  CO2 25 25 27   GLUCOSE 121* 133* 147*  BUN 17 19 17   CREATININE 0.56 0.63 0.58  CALCIUM 8.7* 8.6* 8.5*   GFR: Estimated Creatinine Clearance: 78.8 mL/min (by C-G formula based on SCr of 0.58 mg/dL). Liver Function Tests: Recent Labs  Lab 12/25/2018 0919 12/30/18 0530 12/31/18 0514  AST 133* 147* 151*  ALT 85* 88* 88*  ALKPHOS 118 123 142*  BILITOT 1.1 1.0 1.0  PROT 5.8* 5.8* 5.5*  ALBUMIN 2.7* 2.7* 2.5*   Recent Labs  Lab 12/07/2018 0919  LIPASE 20   No results for input(s): AMMONIA in the last 168 hours. Coagulation Profile: Recent Labs  Lab 12/30/18 0530  INR 1.2   Cardiac Enzymes: No results for input(s): CKTOTAL, CKMB, CKMBINDEX, TROPONINI in the last 168 hours. BNP (last 3 results) No results for input(s): PROBNP in the last 8760 hours. HbA1C: No results for input(s): HGBA1C in the last 72 hours. CBG: Recent Labs  Lab 12/31/18 1642 12/31/18 2107 01/01/19 0815 01/01/19 1235 01/01/19 1657  GLUCAP 141* 154* 102* 151* 160*   Lipid Profile: No results for input(s): CHOL, HDL, LDLCALC, TRIG, CHOLHDL, LDLDIRECT in the last 72 hours. Thyroid Function Tests: No results for input(s): TSH, T4TOTAL, FREET4, T3FREE, THYROIDAB in the last 72 hours. Anemia Panel: No results for input(s): VITAMINB12, FOLATE,  FERRITIN, TIBC, IRON, RETICCTPCT in the last 72 hours. Urine analysis:    Component Value Date/Time   COLORURINE YELLOW (A) 12/28/2018 0920   APPEARANCEUR CLEAR (A) 12/09/2018 0920   LABSPEC 1.015 12/22/2018 0920   PHURINE 6.0 12/23/2018 0920   GLUCOSEU NEGATIVE 12/26/2018 0920   HGBUR NEGATIVE 12/26/2018 0920   BILIRUBINUR NEGATIVE 12/08/2018 0920   BILIRUBINUR neg 05/16/2016 1537   KETONESUR NEGATIVE 12/07/2018 0920   PROTEINUR NEGATIVE 12/14/2018 0920   UROBILINOGEN 0.2 05/16/2016 1537   NITRITE NEGATIVE 12/28/2018 0920   LEUKOCYTESUR NEGATIVE 12/07/2018 0920   Sepsis Labs: @LABRCNTIP (procalcitonin:4,lacticacidven:4)  ) Recent Results (from the past 240 hour(s))  CULTURE, BLOOD (ROUTINE X 2) w Reflex to ID Panel     Status: None   Collection Time: 12/23/18 10:04 AM   Specimen: BLOOD  Result Value Ref Range Status   Specimen Description BLOOD BLOOD RIGHT HAND  Final   Special  Requests   Final    BOTTLES DRAWN AEROBIC AND ANAEROBIC Blood Culture adequate volume   Culture   Final    NO GROWTH 5 DAYS Performed at [facility], [address] [place], [place] [address]    Report Status 12/28/2018 FINAL  Final  CULTURE, BLOOD (ROUTINE X 2) w Reflex to ID Panel     Status: None   Collection Time: 12/23/18 10:11 AM   Specimen: BLOOD  Result Value Ref Range Status   Specimen Description BLOOD BLOOD RIGHT HAND  Final   Special Requests   Final    BOTTLES DRAWN AEROBIC AND ANAEROBIC Blood Culture results may not be optimal due to an excessive volume of blood received in culture bottles   Culture   Final    NO GROWTH 5 DAYS Performed at [facility], [address], [place], [place] [address]    Report Status 12/28/2018 FINAL  Final  Urine culture     Status: Abnormal   Collection Time: 12/20/2018  9:20 AM   Specimen: Urine, Clean Catch  Result Value Ref Range Status   Specimen Description   Final    URINE, CLEAN CATCH Performed at [facility],  [address] [place], [place] [address]    Special Requests   Final    NONE Performed at [facility], [address] [place], [place] [address]    Culture (A)  Final    10,000 COLONIES/mL KLEBSIELLA OXYTOCA Confirmed Extended Spectrum Beta-Lactamase Producer (ESBL).  In bloodstream infections from ESBL organisms, carbapenems are preferred over piperacillin/tazobactam. They are shown to have a lower risk of mortality.    Report Status 12/31/2018 FINAL  Final   Organism ID, Bacteria KLEBSIELLA OXYTOCA (A)  Final      Susceptibility   Klebsiella oxytoca - MIC*    AMPICILLIN >=32 RESISTANT Resistant     CEFAZOLIN >=64 RESISTANT Resistant     CEFTRIAXONE 8 SENSITIVE Sensitive     CIPROFLOXACIN <=0.25 SENSITIVE Sensitive     GENTAMICIN <=1 SENSITIVE Sensitive     IMIPENEM <=0.25 SENSITIVE Sensitive     NITROFURANTOIN 32 SENSITIVE Sensitive     TRIMETH/SULFA <=20 SENSITIVE Sensitive     AMPICILLIN/SULBACTAM >=32 RESISTANT Resistant     PIP/TAZO >=128 RESISTANT Resistant     Extended ESBL POSITIVE Resistant     * 10,000 COLONIES/mL KLEBSIELLA OXYTOCA  Blood culture (routine x 2)     Status: None (Preliminary result)   Collection Time: 12/26/2018 11:13 AM   Specimen: BLOOD  Result Value Ref Range Status   Specimen Description BLOOD RIGHT ANTECUBITAL  Final   Special Requests   Final    BOTTLES DRAWN AEROBIC AND ANAEROBIC Blood Culture adequate volume   Culture   Final    NO GROWTH 2 DAYS Performed at [facility], [address] [place], [place] [address]    Report Status PENDING  Incomplete  Blood culture (routine x 2)     Status: None (Preliminary result)   Collection Time: 12/12/2018 11:13 AM   Specimen: BLOOD  Result Value Ref Range Status   Specimen Description BLOOD LEFT ANTECUBITAL  Final   Special Requests   Final    BOTTLES DRAWN AEROBIC AND ANAEROBIC Blood Culture adequate volume   Culture   Final    NO GROWTH 2 DAYS Performed at [facility], [address] [place], [place] [address]    Report Status PENDING  Incomplete  SARS CORONAVIRUS 2 (TAT 6-24  HRS) Nasopharyngeal Nasopharyngeal Swab     Status: None   Collection Time: 12/07/2018  1:19 PM   Specimen: Nasopharyngeal Swab  Result Value Ref Range Status   SARS Coronavirus 2 NEGATIVE NEGATIVE Final    Comment: (NOTE) SARS-CoV-2 target nucleic acids are NOT DETECTED. The SARS-CoV-2 RNA is generally detectable in upper and lower respiratory specimens during the acute phase of infection. Negative results do not preclude SARS-CoV-2 infection, do not rule out co-infections with other pathogens, and should not be used as the sole basis for treatment or other patient management decisions. Negative results must be combined with clinical observations, patient history, and epidemiological information. The expected result is Negative. Fact Sheet for Patients: SugarRoll.be Fact Sheet for Healthcare Providers: https://www.woods-mathews.com/ This test is not yet approved or cleared by the [place] FDA and  has been authorized for detection and/or diagnosis of SARS-CoV-2 by FDA under an Emergency Use Authorization (EUA). This EUA will remain  in effect (meaning this test can be used) for the duration of the COVID-19 declaration under Section 56 4(b)(1) of the Act, 21 U.S.C. section 360bbb-3(b)(1), unless the authorization is terminated or revoked sooner. Performed at [facility], [place] [address], [place],  [address]          Radiology Studies: Ct Angio Chest Pe W Or Wo Contrast  Result Date: 12/31/2018 CLINICAL DATA:  Hypoxemia EXAM: CT ANGIOGRAPHY CHEST WITH CONTRAST TECHNIQUE: Multidetector CT imaging of the chest was performed using the standard protocol during bolus administration of intravenous contrast. Multiplanar CT image reconstructions and MIPs were obtained to evaluate the vascular anatomy.  CONTRAST:  82mL OMNIPAQUE IOHEXOL 350 MG/ML SOLN COMPARISON:  None. FINDINGS: Cardiovascular: No filling defects in the pulmonary arteries to suggest pulmonary emboli. Heart is normal size. Aorta is normal caliber. Small pericardial effusion. Mediastinum/Nodes: No mediastinal, hilar, or axillary adenopathy. Trachea and esophagus are unremarkable. Thyroid unremarkable. Lungs/Pleura: Trace left effusion and small right effusion. Bilateral lower lobe airspace opacities are noted, right greater than left which could reflect atelectasis or pneumonia. There are scattered predominantly peripheral ground-glass opacities in the upper lobes which could reflect atypical/viral infection. Upper Abdomen: While not imaged in its entirety, the liver appears prominent. Recommend clinical correlation for possible hepatomegaly. Small amount of ascites surrounding the liver and spleen. Musculoskeletal: No acute bony abnormality. Chest wall soft tissues are unremarkable. Right chest wall Port-A-Cath in place with the tip in the SVC. Review of the MIP images confirms the above findings. IMPRESSION: No evidence of pulmonary embolus. Small right effusion and trace left effusion. Small pericardial effusion. Bilateral lower lobe atelectasis or infiltrate/pneumonia. Patchy peripheral ground-glass opacities in the upper lobes could reflect atypical/viral pneumonia. COVID pneumonia cannot be excluded. Liver appears prominent, but is not imaged in its entirety. Recommend clinical correlation for possible hepatomegaly. Perihepatic and perisplenic ascites. Electronically Signed   By: Rolm Baptise M.D.   On: 12/31/2018 19:30        Scheduled Meds: . Chlorhexidine Gluconate Cloth  6 each Topical Daily  . cholecalciferol  2,000 Units Oral QPM  . dexamethasone  8 mg Oral Q8H  . feeding supplement (ENSURE ENLIVE)  237 mL Oral BID BM  . fentaNYL  1 patch Transdermal Q72H  . insulin aspart  0-9 Units Subcutaneous TID WC  . losartan  50 mg  Oral Daily  . multivitamin with minerals  1 tablet Oral Daily  . omega-3 acid ethyl esters  1,000 mg Oral QPM  . pantoprazole  40 mg Oral  Daily  . polyethylene glycol  17 g Oral Daily  . sodium chloride flush  10 mL Intravenous q morning - 10a  . sucralfate  1 g Oral TID WC & HS   Continuous Infusions: . sodium chloride Stopped (12/31/18 0101)     LOS: 3 days    Time spent: 25 minutes    Edwin Dada, MD [facility] 01/01/2019, 5:32 PM     Please page though [place] or Epic secure chat:  For [employer], Adult nurse

## 2019-01-01 NOTE — Consult Note (Signed)
[place]  [phone] [phone]   Name: Shelby [name] Date: 01/01/2019 MRN: [id number]  DOB: 01/20/1971  Patient Care Team: Jerrol Banana., MD as PCP - General (Family Medicine) [name], MD as Consulting Physician (Cardiology) Alfonzo Feller, RN as [place] Management    REASON FOR CONSULTATION: Palliative Care consult requested for this 48 y.o. female with multiple medical problems including progressive stage IV triple negative breast cancer who was recently hospitalized 12/18/2018-12/20/2018 with abdominal pain and fevers.  Imaging revealed probable hepatic metastases and patient underwent biopsy on 12/20/2018 with findings consistent with metastatic breast cancer.  Patient was readmitted 12/21/2018-12/27/2018 again with acute right upper quadrant pain and fevers.  Fevers were thought to be from occult infection vs tumor burden. Patient is readmitted 12/25/2018 with abdominal pain. Palliative care was consulted to help address goals and manage ongoing symptoms.  SOCIAL HISTORY:     reports that she quit smoking about 11 years ago. Her smoking use included cigarettes. She has a 5.00 pack-year smoking history. She has never used smokeless tobacco. She reports current alcohol use. She reports that she does not use drugs.   Patient is divorced.  She lives at home with her 57 year old son and 93 year old son.  Her mother, who also has cancer recently moved in to help with her care.  Her sister is also been living there to help.  Patient is a [employer] of [employer].  ADVANCE DIRECTIVES:  Does not have  CODE STATUS: Full code  PAST MEDICAL HISTORY: Past Medical History:  Diagnosis Date   Anemia    h/o with pregnancy   Anxiety    Asthma    allergy induced-no inhaler   Cancer ([place])    breast left    Complication of anesthesia    Environmental allergies      Family history of adverse reaction to anesthesia    son-breathing problems-coded 1st time when he was 2 and had another surgery at 77 and had to be admitted for breathing problems   Family history of breast cancer    GERD (gastroesophageal reflux disease)    Headache    migraines   Hypertension    Mitral valve disease    Mitral valve prolapse    Painful menstrual periods    PONV (postoperative nausea and vomiting)    Vertigo     PAST SURGICAL HISTORY:  Past Surgical History:  Procedure Laterality Date   ABDOMINAL HYSTERECTOMY  2010   supracervical    AXILLARY LYMPH NODE DISSECTION Left 07/12/2018   Procedure: AXILLARY LYMPH NODE DISSECTION;  Surgeon: Robert Bellow, MD;  Location: [facility] ORS;  Service: General;  Laterality: Left;   BREAST BIOPSY Left 11/2017   invasive ductal carcinoma and metastatic LN   BREAST BIOPSY WITH SENTINEL LYMPH NODE BIOPSY AND NEEDLE LOCALIZATION Left 07/12/2018   Procedure: BREAST BIOPSY WIDE EXCISION WITH SENTINEL NODE  AND NEEDLE LOCALIZATION OF [name] LEFT;  Surgeon: Robert Bellow, MD;  Location: [facility] ORS;  Service: General;  Laterality: Left;   BUNIONECTOMY Bilateral    MANDIBLE RECONSTRUCTION     age 66-underbite   PORTACATH PLACEMENT Right 12/31/2017   Procedure: INSERTION PORT-A-CATH;  Surgeon: Robert Bellow, MD;  Location: [facility] OR[facility];  Service: General;  Laterality: Right;   RE-EXCISION OF BREAST LUMPECTOMY Left 07/24/2018   Procedure: RE-EXCISION OF BREAST LUMPECTOMY LEFT;  Surgeon: Robert Bellow, MD;  Location:  [facility] OR[facility];  Service: General;  Laterality: Left;   SHOULDER ARTHROSCOPY WITH OPEN ROTATOR CUFF REPAIR Right 04/26/2017   Procedure: SHOULDER ARTHROSCOPY WITH OPEN ROTATOR CUFF REPAIR,SUBACROMINAL DECOMPRESSION;  Surgeon: Thornton Park, MD;  Location: [facility] OR[facility];  Service: Orthopedics;  Laterality: Right;   TONSILLECTOMY      HEMATOLOGY/ONCOLOGY HISTORY:  Oncology History Overview Note   Initially seen by nurse midwife Melody Ellsworth at gynecologist office for left tender breast mass.  Had breast ultrasound and and mammogram revealing suspicious palpable mass of left breast requiring biopsy.  Biopsy revealed grade 3 invasive ductal carcinoma.  Positive metastatic carcinoma and left axillary lymph node.  Met with Dr. Grayland Ormond on 12/24/2017 where he recommended neoadjuvant chemotherapy using Adriamycin, Cytoxan with Neulasta support followed by carbo/Taxol.  XRT may not be needed given patient would like mastectomy of left breast.   Had port placed by Dr. Bary Castilla on 12/31/2017.  CT abdomen/pelvis/chest with contrast on 01/02/2018 did not reveal metastatic disease.  Pretreatment MUGA revealed an EF of 71%.    Malignant neoplasm of female breast ([place])  12/23/2017 Initial Diagnosis   Primary cancer of lower-inner quadrant of left female breast ([place])   12/24/2017 Cancer Staging   Staging form: Breast, AJCC 8th Edition - Clinical stage from 12/24/2017: Stage IIIB (cT2, cN1, cM0, G3, ER-, PR-, HER2-) - Signed by Lloyd Huger, MD on 12/24/2017   01/03/2018 - 06/26/2018 Chemotherapy   The patient had DOXOrubicin (ADRIAMYCIN) chemo injection 104 mg, 60 mg/m2 = 104 mg, Intravenous,  Once, 4 of 4 cycles Administration: 104 mg (01/03/2018), 104 mg (01/17/2018), 104 mg (01/31/2018), 104 mg (02/14/2018) palonosetron (ALOXI) injection 0.25 mg, 0.25 mg, Intravenous,  Once, 8 of 8 cycles Administration: 0.25 mg (01/03/2018), 0.25 mg (02/28/2018), 0.25 mg (01/17/2018), 0.25 mg (03/21/2018), 0.25 mg (01/31/2018), 0.25 mg (02/14/2018), 0.25 mg (05/02/2018), 0.25 mg (05/30/2018) pegfilgrastim-cbqv (UDENYCA) injection 6 mg, 6 mg, Subcutaneous, Once, 6 of 6 cycles Administration: 6 mg (01/04/2018), 6 mg (01/18/2018), 6 mg (02/01/2018), 6 mg (02/15/2018), 6 mg (05/31/2018) CARBOplatin (PARAPLATIN) 680 mg in sodium chloride 0.9 % 250 mL chemo infusion, 680 mg (100 % of original dose 676.2 mg), Intravenous,  Once,  4 of 4 cycles Dose modification:   (original dose 676.2 mg, Cycle 5) Administration: 680 mg (02/28/2018), 680 mg (03/21/2018), 680 mg (05/02/2018), 680 mg (05/30/2018) cyclophosphamide (CYTOXAN) 1,000 mg in sodium chloride 0.9 % 250 mL chemo infusion, 1,040 mg, Intravenous,  Once, 4 of 4 cycles Administration: 1,000 mg (01/03/2018), 1,000 mg (01/17/2018), 1,000 mg (01/31/2018), 1,000 mg (02/14/2018) PACLitaxel (TAXOL) 138 mg in sodium chloride 0.9 % 250 mL chemo infusion (</= 23m/m2), 80 mg/m2 = 138 mg, Intravenous,  Once, 4 of 4 cycles Administration: 138 mg (02/28/2018), 138 mg (03/07/2018), 138 mg (03/14/2018), 138 mg (03/21/2018), 138 mg (04/04/2018), 138 mg (04/18/2018), 138 mg (05/02/2018), 138 mg (05/16/2018), 138 mg (05/23/2018), 138 mg (05/30/2018), 138 mg (06/13/2018), 138 mg (06/20/2018) fosaprepitant (EMEND) 150 mg, dexamethasone (DECADRON) 12 mg in sodium chloride 0.9 % 145 mL IVPB, , Intravenous,  Once, 8 of 8 cycles Administration:  (01/03/2018),  (02/28/2018),  (01/17/2018),  (03/21/2018),  (01/31/2018),  (02/14/2018),  (05/02/2018),  (05/30/2018)  for chemotherapy treatment.    08/01/2018 Cancer Staging   Staging form: Breast, AJCC 8th Edition - Pathologic stage from 08/01/2018: No Stage Recommended (ypT1c, pN1a, cM0, ER-, PR-, HER2-) - Signed by FLloyd Huger MD on 08/01/2018   10/09/2018 - 11/24/2018 Chemotherapy   The patient had [No matching medication found in this treatment plan]  for chemotherapy treatment.    01/02/2019 -  Chemotherapy   The patient had eriBULin mesylate (HALAVEN) 1.9 mg in sodium chloride 0.9 % 100 mL chemo infusion, 1.1 mg/m2 = 1.9 mg (100 % of original dose 1.1 mg/m2), Intravenous,  Once, 0 of 4 cycles Dose modification: 1.1 mg/m2 (original dose 1.1 mg/m2, Cycle 1, Reason: Change in LFTs)  for chemotherapy treatment.      ALLERGIES:  is allergic to hydrocodone; iodine; peanut butter flavor; shellfish allergy; and vancomycin.  MEDICATIONS:  Current Facility-Administered  Medications  Medication Dose Route Frequency Provider Last Rate Last Dose   0.9 %  sodium chloride infusion   Intravenous PRN Damita Lack, MD   Stopped at 12/31/18 0101   ALPRAZolam (XANAX) tablet 0.25-0.5 mg  0.25-0.5 mg Oral BID PRN Damita Lack, MD   0.5 mg at 12/30/18 1300   bisacodyl (DULCOLAX) EC tablet 5 mg  5 mg Oral Daily PRN Damita Lack, MD   5 mg at 12/31/18 0165   Chlorhexidine Gluconate Cloth 2 % PADS 6 each  6 each Topical Daily Damita Lack, MD   6 each at 01/01/19 0841   cholecalciferol (VITAMIN D) tablet 2,000 Units  2,000 Units Oral QPM Amin, Ankit Chirag, MD   2,000 Units at 01/01/19 1706   ciprofloxacin (CIPRO) tablet 500 mg  500 mg Oral BID Tsosie Billing, MD       dexamethasone (DECADRON) tablet 8 mg  8 mg Oral Q8H Amin, Ankit Chirag, MD   8 mg at 01/01/19 1254   famotidine (PEPCID) tablet 20 mg  20 mg Oral Daily PRN Amin, Jeanella Flattery, MD   20 mg at 12/31/18 1158   feeding supplement (ENSURE ENLIVE) (ENSURE ENLIVE) liquid 237 mL  237 mL Oral BID BM Amin, Ankit Chirag, MD   237 mL at 12/31/18 1158   fentaNYL (DURAGESIC) 25 MCG/HR 1 patch  1 patch Transdermal Q72H Lloyd Huger, MD   1 patch at 01/01/19 1254   fentaNYL (SUBLIMAZE) injection 25 mcg  25 mcg Intravenous Q4H PRN Amin, Ankit Chirag, MD       HYDROmorphone (DILAUDID) injection 1 mg  1 mg Intravenous Q1H PRN [name] Dilkon, MD   1 mg at 12/19/2018 1600   HYDROmorphone (DILAUDID) tablet 1 mg  1 mg Oral Q4H PRN Amin, Ankit Chirag, MD       insulin aspart (novoLOG) injection 0-9 Units  0-9 Units Subcutaneous TID WC Amin, Ankit Chirag, MD   2 Units at 01/01/19 1706   losartan (COZAAR) tablet 50 mg  50 mg Oral Daily Amin, Ankit Chirag, MD   50 mg at 01/01/19 0842   multivitamin with minerals tablet 1 tablet  1 tablet Oral Daily Amin, Ankit Chirag, MD   1 tablet at 01/01/19 0842   omega-3 acid ethyl esters (LOVAZA) capsule 1,000 mg  1,000 mg Oral QPM Amin, Ankit Chirag,  MD   1,000 mg at 01/01/19 1706   ondansetron (ZOFRAN) tablet 4 mg  4 mg Oral Q6H PRN Amin, Ankit Chirag, MD   4 mg at 01/01/19 0539   Or   ondansetron (ZOFRAN) injection 4 mg  4 mg Intravenous Q6H PRN Amin, Ankit Chirag, MD       pantoprazole (PROTONIX) EC tablet 40 mg  40 mg Oral Daily Amin, Ankit Chirag, MD   40 mg at 01/01/19 0842   polyethylene glycol (MIRALAX / GLYCOLAX) packet 17 g  17 g Oral Daily Lorella Nimrod, MD   17 g  at 12/31/18 0845   simethicone (MYLICON) chewable tablet 80 mg  80 mg Oral QID PRN Gerald Dexter, RPH   80 mg at 01/01/19 1739   sodium chloride flush (NS) 0.9 % injection 10 mL  10 mL Intravenous q morning - 10a Amin, Ankit Chirag, MD   10 mL at 01/01/19 0843   sodium chloride flush (NS) 0.9 % injection 10 mL  10 mL Intravenous PRN Damita Lack, MD   10 mL at 12/26/2018 1735   sucralfate (CARAFATE) tablet 1 g  1 g Oral TID WC & HS Amin, Ankit Chirag, MD   1 g at 01/01/19 1706   sulfamethoxazole-trimethoprim (BACTRIM DS) 800-160 MG per tablet 1 tablet  1 tablet Oral Q12H Tsosie Billing, MD       traMADol (ULTRAM) tablet 50 mg  50 mg Oral Q12H PRN Amin, Ankit Chirag, MD        VITAL SIGNS: BP (!) 124/96 (BP Location: Right Arm)    Pulse 95    Temp 97.8 F (36.6 C) (Oral)    Resp 18    Ht 5' 6"  (1.676 m)    Wt 139 lb 5.3 oz (63.2 kg)    SpO2 96%    BMI 22.49 kg/m  Filed Weights   12/21/2018 0855 12/31/18 0500  Weight: 143 lb (64.9 kg) 139 lb 5.3 oz (63.2 kg)    Estimated body mass index is 22.49 kg/m as calculated from the following:   Height as of this encounter: 5' 6"  (1.676 m).   Weight as of this encounter: 139 lb 5.3 oz (63.2 kg).  LABS: CBC:    Component Value Date/Time   WBC 13.6 (H) 01/01/2019 0528   HGB 12.6 01/01/2019 0528   HGB 11.8 08/13/2017 1556   HCT 35.8 (L) 01/01/2019 0528   HCT 34.8 12/02/2018 1729   PLT 35 (L) 01/01/2019 0528   PLT 198 08/13/2017 1556   MCV 90.9 01/01/2019 0528   MCV 90 08/13/2017 1556   MCV  92 11/25/2012 1542   NEUTROABS 3.0 12/23/2018 2102   NEUTROABS 8.8 (H) 08/13/2017 1556   NEUTROABS 4.2 11/25/2012 1542   LYMPHSABS 0.3 (L) 12/23/2018 2102   LYMPHSABS 0.4 (L) 08/13/2017 1556   LYMPHSABS 1.7 11/25/2012 1542   MONOABS 0.5 12/23/2018 2102   MONOABS 0.4 11/25/2012 1542   EOSABS 0.0 12/23/2018 2102   EOSABS 0.2 08/13/2017 1556   EOSABS 0.1 11/25/2012 1542   BASOSABS 0.0 12/23/2018 2102   BASOSABS 0.0 08/13/2017 1556   BASOSABS 0.1 11/25/2012 1542   Comprehensive Metabolic Panel:    Component Value Date/Time   NA 131 (L) 12/31/2018 0514   NA 137 06/22/2017 1448   NA 137 11/25/2012 1542   K 4.8 12/31/2018 0514   K 3.7 11/25/2012 1542   CL 93 (L) 12/31/2018 0514   CL 103 11/25/2012 1542   CO2 27 12/31/2018 0514   CO2 28 11/25/2012 1542   BUN 17 12/31/2018 0514   BUN 17 06/22/2017 1448   BUN 15 11/25/2012 1542   CREATININE 0.58 12/31/2018 0514   CREATININE 0.73 11/25/2012 1542   GLUCOSE 147 (H) 12/31/2018 0514   GLUCOSE 103 (H) 11/25/2012 1542   CALCIUM 8.5 (L) 12/31/2018 0514   CALCIUM 9.3 11/25/2012 1542   AST 151 (H) 12/31/2018 0514   AST 18 11/25/2012 1542   ALT 88 (H) 12/31/2018 0514   ALT 17 11/25/2012 1542   ALKPHOS 142 (H) 12/31/2018 0514   ALKPHOS 60 11/25/2012 1542  BILITOT 1.0 12/31/2018 0514   BILITOT 0.4 06/22/2017 1448   BILITOT 0.3 11/25/2012 1542   PROT 5.5 (L) 12/31/2018 0514   PROT 7.2 06/22/2017 1448   PROT 7.4 11/25/2012 1542   ALBUMIN 2.5 (L) 12/31/2018 0514   ALBUMIN 5.0 06/22/2017 1448   ALBUMIN 4.0 11/25/2012 1542    RADIOGRAPHIC STUDIES: Ct Abdomen Pelvis Wo Contrast  Addendum Date: 12/30/2018   ADDENDUM REPORT: 12/27/2018 10:16 ADDENDUM: Urinary bladder wall mildly thickened. A degree of cystitis questioned. Electronically Signed   By: Lowella Grip III M.D.   On: 12/10/2018 10:16   Result Date: 12/13/2018 CLINICAL DATA:  Abdominal pain with nausea and distention. Breast carcinoma with known liver metastases EXAM: CT  ABDOMEN AND PELVIS WITHOUT CONTRAST TECHNIQUE: Multidetector CT imaging of the abdomen and pelvis was performed following the standard protocol without oral or IV contrast. COMPARISON:  December 21, 2018 FINDINGS: Lower chest: There is a right pleural effusion with atelectatic change in the right base. There is atelectatic change in the left base with rather minimal left pleural effusion. Small pericardial effusion noted. Hepatobiliary: There is widespread metastatic disease is seen throughout the liver, better delineated on recent contrast-enhanced study. Gallbladder is largely collapsed. No biliary duct dilatation evident. Pancreas: No pancreatic mass or inflammatory focus. Spleen: No splenic lesions are evident. Adrenals/Urinary Tract: Adrenals appear unremarkable bilaterally. Kidneys bilaterally show no evident mass or hydronephrosis on either side. There is no evident renal or ureteral calculus on either side. Urinary bladder is midline. The urinary bladder wall thickness is mildly increased. Stomach/Bowel: There is moderate stool in the colon. There is no appreciable bowel wall or mesenteric thickening. No evident bowel obstruction. Terminal ileal region appears unremarkable. There is no free air or portal venous air. Vascular/Lymphatic: No abdominal aortic aneurysm. No arterial lesions seen on this noncontrast enhanced study. Multiple upper abdominal venous collaterals are again noted without change from recent study. A focal periceliac lymph node measuring 1.6 x 1.3 cm is stable. No new adenopathy evident compared to recent study. Reproductive: Uterus absent.  No pelvic mass evident. Other: There is moderate ascites, slightly increased compared to recent study. No abscess evident in the abdomen or pelvis. No periappendiceal region inflammation. Appendix not appreciable. Musculoskeletal: No lytic or destructive bone lesions are evident. A small sclerotic focus in the right sacrum may represent a small bone  island. No intramuscular lesions are evident. IMPRESSION: 1.  Moderate ascites, slightly increased compared to recent study. 2.  Widespread hepatic metastatic disease. 3. No bowel obstruction. No abscess in the abdomen or pelvis. No periappendiceal region inflammation. 4.  Enlarged periceliac lymph node, suspected due to neoplasm. 5. Venous collaterals in the upper abdomen, primarily on the left, stable. Further assessment with respect to etiology not possible without intravenous contrast. Appearance stable compared to recent contrast enhanced CT examination. 6.  Uterus absent. 7. Small pleural effusions with bibasilar atelectatic change. Small pericardial effusion. Electronically Signed: By: Lowella Grip III M.D. On: 12/12/2018 10:13   Ct Abdomen Pelvis Wo Contrast  Addendum Date: 12/18/2018   ADDENDUM REPORT: 12/18/2018 20:18 ADDENDUM: These results were called by telephone on 12/18/2018 at 8:18 pm to provider [facility] , who verbally acknowledged these results. Electronically Signed   By: Lovena Le M.D.   On: 12/18/2018 20:18   Result Date: 12/18/2018 CLINICAL DATA:  Abdominal pain and distension, undergoing chemotherapy for breast cancer, history of IBS and diverticulitis EXAM: CT ABDOMEN AND PELVIS WITHOUT CONTRAST TECHNIQUE: Multidetector CT imaging of the abdomen  and pelvis was performed following the standard protocol without IV contrast. COMPARISON:  CT abdomen pelvis 01/01/2018 FINDINGS: Lower chest: The patchy areas of subpleural ground-glass opacity could reflect atelectasis or early infection. Cardiac size is normal. Trace pericardial effusion, similar to prior. Hepatobiliary: Ill-defined regions of hypoattenuation are seen the anterior left lobe liver. Slightly nodular hepatic surface contour which is new from comparison exam. These features are both incompletely assessed in the absence of contrast media. Gallbladder appears largely contracted at the time of exam. No biliary ductal  dilatation. Pancreas: Unremarkable. No pancreatic ductal dilatation or surrounding inflammatory changes. Spleen: Normal in size without focal abnormality. Adrenals/Urinary Tract: No suspicious adrenal lesions. No visible or contour deforming renal lesions. No urolithiasis or hydronephrosis mild circumferential bladder wall thickening. Stomach/Bowel: Distal esophagus, stomach and duodenal sweep are unremarkable. No small bowel wall thickening or dilatation. No evidence of obstruction. Appendix is not clearly identified. No pericecal inflammation. There is some mild mural thickening of the splenic flexure and descending colon remaining portions of the colon are free of mural thickening or dilatation. Vascular/Lymphatic: Upper abdominal venous collateralization and splenorenal shunting is noted. No suspicious or enlarged lymph nodes in the included lymphatic chains. Reproductive: Uterus is surgically absent. No concerning adnexal lesions. Other: Small volume of low-attenuation ascites noted in the low abdomen. Small volume of perihepatic low-attenuation ascites is present as well. Musculoskeletal: Multilevel degenerative changes are present in the imaged portions of the spine. No acute osseous abnormality or suspicious osseous lesion. IMPRESSION: 1. Interval development of cirrhotic stigmata including a nodular liver surface contour and increasing upper abdominal venous collateralization. Small volume of ascites is noted as well. 2. Ill-defined regions of hypoattenuation are seen within the anterior left lobe liver, which are incompletely assessed in the absence of contrast media. Furthermore findings are worrisome in the setting of known malignancy and potential intrinsic liver disease. Consider further evaluation with MRI or ultrasound if patient is unable to tolerate CT contrast due to allergy. 3. Mild mural thickening of the splenic flexure and descending colon, could reflect change related to the additional  cirrhotic findings/portal colopathy though should exclude a an acute colitis clinically. 4. Patchy areas of subpleural ground-glass opacity in the visualized lung bases could reflect atelectasis or early infection. 5. Mild circumferential bladder wall thickening, which may be related to underdistention. Correlate with urinalysis to exclude cystitis. 6. Unchanged trace pericardial effusion. Electronically Signed: By: Lovena Le M.D. On: 12/18/2018 20:07   Dg Chest 1 View  Result Date: 12/23/2018 CLINICAL DATA:  Cirrhosis, ascites, SIRS, history LEFT breast cancer, hypertension EXAM: CHEST  1 VIEW COMPARISON:  Portable exam 1016 hours compared to 12/21/2018 FINDINGS: RIGHT subclavian Port-A-Cath with tip projecting over superior RIGHT atrium, unchanged. Normal heart size, mediastinal contours, and pulmonary vascularity. Bibasilar atelectasis. Remaining lungs clear. No pleural effusion or pneumothorax. IMPRESSION: Bibasilar atelectasis. Electronically Signed   By: Lavonia Dana M.D.   On: 12/23/2018 10:58   Ct Angio Chest Pe W Or Wo Contrast  Result Date: 12/31/2018 CLINICAL DATA:  Hypoxemia EXAM: CT ANGIOGRAPHY CHEST WITH CONTRAST TECHNIQUE: Multidetector CT imaging of the chest was performed using the standard protocol during bolus administration of intravenous contrast. Multiplanar CT image reconstructions and MIPs were obtained to evaluate the vascular anatomy. CONTRAST:  11m OMNIPAQUE IOHEXOL 350 MG/ML SOLN COMPARISON:  None. FINDINGS: Cardiovascular: No filling defects in the pulmonary arteries to suggest pulmonary emboli. Heart is normal size. Aorta is normal caliber. Small pericardial effusion. Mediastinum/Nodes: No mediastinal, hilar, or axillary adenopathy.  Trachea and esophagus are unremarkable. Thyroid unremarkable. Lungs/Pleura: Trace left effusion and small right effusion. Bilateral lower lobe airspace opacities are noted, right greater than left which could reflect atelectasis or pneumonia.  There are scattered predominantly peripheral ground-glass opacities in the upper lobes which could reflect atypical/viral infection. Upper Abdomen: While not imaged in its entirety, the liver appears prominent. Recommend clinical correlation for possible hepatomegaly. Small amount of ascites surrounding the liver and spleen. Musculoskeletal: No acute bony abnormality. Chest wall soft tissues are unremarkable. Right chest wall Port-A-Cath in place with the tip in the SVC. Review of the MIP images confirms the above findings. IMPRESSION: No evidence of pulmonary embolus. Small right effusion and trace left effusion. Small pericardial effusion. Bilateral lower lobe atelectasis or infiltrate/pneumonia. Patchy peripheral ground-glass opacities in the upper lobes could reflect atypical/viral pneumonia. COVID pneumonia cannot be excluded. Liver appears prominent, but is not imaged in its entirety. Recommend clinical correlation for possible hepatomegaly. Perihepatic and perisplenic ascites. Electronically Signed   By: Rolm Baptise M.D.   On: 12/31/2018 19:30   Ct Angio Chest Pe W And/or Wo Contrast  Result Date: 12/21/2018 CLINICAL DATA:  48 y.o. female discharged yesterday for concerns of abdominal pain with a work-up regarding her liver as well as she is meeting SIRS criteria with feverPatient comes back today reports she continues to have significant pain and discomfort. She has been having ongoing and severe pain located in the mid to right upper abdomen, also she reports fairly significant pain when she takes a deep breath over the right lower lungShe has been feeling warm had a fever when she was in the hospital yesterday. Some nausea. No headaches. Tested negative for Covid a few days ago pain is located primarily in the right upper abdomen under the right rib cage. EXAM: CT ANGIOGRAPHY CHEST CT ABDOMEN AND PELVIS WITH CONTRAST TECHNIQUE: Multidetector CT imaging of the chest was performed using the standard  protocol during bolus administration of intravenous contrast. Multiplanar CT image reconstructions and MIPs were obtained to evaluate the vascular anatomy. Multidetector CT imaging of the abdomen and pelvis was performed using the standard protocol during bolus administration of intravenous contrast. CONTRAST:  16m OMNIPAQUE IOHEXOL 350 MG/ML SOLN COMPARISON:  Chest CTA and abdomen and pelvis CT dated 12/19/2018. FINDINGS: CTA CHEST FINDINGS Cardiovascular: There is satisfactory opacification of the pulmonary arteries to the segmental level. No evidence of a pulmonary embolism. Mediastinum/Nodes: No enlarged mediastinal, hilar, or axillary lymph nodes. Thyroid gland, trachea, and esophagus demonstrate no significant findings. Lungs/Pleura: Small bilateral pleural effusions. There is dependent opacity in the lower lobes consistent with atelectasis. Small areas of ground-glass opacity and small lung nodules noted. Dominant nodule in the right upper lobe, image 46, series 4, 6 mm. Several other small nodules are new from the prior CT. Area of ground-glass opacity adjacent to the oblique fissure in the left upper lobe is new. No pneumothorax. Musculoskeletal: No chest wall abnormality. No acute or significant osseous findings. Review of the MIP images confirms the above findings. CT ABDOMEN and PELVIS FINDINGS Hepatobiliary: Enlarged liver. There are multiple hypoattenuating areas throughout the liver. These areas are more apparent and more extensive than on the recent prior CT, particularly evident at the dome of segment 4A, along the anterior aspect of the right lobe and medial segment of the left lobe and along the inferior aspect of the right lobe, segment 5 and 6. Gallbladder is mostly collapsed. There is increased attenuation material within the gallbladder similar to the  prior CT. No bile duct dilation Pancreas: Unremarkable. No pancreatic ductal dilatation or surrounding inflammatory changes. Spleen: Normal in  size without focal abnormality. Adrenals/Urinary Tract: No adrenal masses. Right kidney displaced inferiorly by the enlarged liver. Symmetric renal enhancement and excretion. No renal masses, stones or hydronephrosis. Normal ureters. Bladder wall is prominent, unchanged from the recent prior study. No bladder masses. Stomach/Bowel: Stomach is unremarkable, mostly decompressed. Small bowel and colon are normal in caliber. No wall thickening or inflammation. Appendix not discretely seen. No findings of appendicitis. Vascular/Lymphatic: As noted on the prior CT, left portal vein is attenuated, but does show enhancement. No evidence of thrombosis. There are venous collaterals in the upper abdomen. Aorta is unremarkable. No enlarged lymph nodes. Prominent Peri celiac node noted on the prior study is stable. Reproductive: Status post hysterectomy. No adnexal masses. Other: Small amount of ascites increased when compared to the prior CT. Musculoskeletal: No acute or significant osseous findings. Review of the MIP images confirms the above findings. IMPRESSION: CHEST CTA 1. No evidence of a pulmonary embolism. 2. Small pleural effusions, new from the prior CT. There is increased opacity in the lower lobes that is consistent with atelectasis. There several new small nodules and small areas of ground-glass opacity when compared to the prior CT. Suspect these are inflammatory given change from the recent exam. ABDOMEN AND PELVIS CT 1. Multiple hypoattenuating liver lesions. These appear to have progressed when compared to the prior CT, although this change could be due to differences in contrast timing. The liver is enlarged, stable from the recent exam. Findings are concerning for multifocal hepatocellular carcinoma. Follow-up liver MRI without and with contrast, when the patient can tolerate the procedure, is recommended. 2. Small amount ascites has increased in quantity from the prior study. 3. There are vascular  collaterals in the upper abdomen, stable. Electronically Signed   By: Lajean Manes M.D.   On: 12/21/2018 18:36   Ct Angio Chest Pe W And/or Wo Contrast  Result Date: 12/19/2018 CLINICAL DATA:  Chest pain, complex, intermediate/high prob of ACS/PE/AAS; Abd distension Epigastric pain. Exertional dyspnea. Cough. Weakness. History of breast cancer. EXAM: CT ANGIOGRAPHY CHEST CT ABDOMEN AND PELVIS WITH CONTRAST TECHNIQUE: Multidetector CT imaging of the chest was performed using the standard protocol during bolus administration of intravenous contrast. Multiplanar CT image reconstructions and MIPs were obtained to evaluate the vascular anatomy. Multidetector CT imaging of the abdomen and pelvis was performed using the standard protocol during bolus administration of intravenous contrast. No reported contrast reaction. CONTRAST:  135m OMNIPAQUE IOHEXOL 350 MG/ML SOLN COMPARISON:  Abdominal CT without contrast yesterday. Chest CT 01/01/2018 FINDINGS: CTA CHEST FINDINGS Cardiovascular: There are no filling defects within the pulmonary arteries to suggest pulmonary embolus. Thoracic aorta is normal in caliber without dissection. Left vertebral artery arises directly from the thoracic aorta, variant arch anatomy. Heart is normal in size. No pericardial effusion. Right chest port in place with tip in the SVC. Mediastinum/Nodes: No mediastinal or hilar adenopathy. No visualized thyroid nodule. No esophageal wall thickening. Lungs/Pleura: Mild patchy areas of subpleural nodular and ground-glass opacities. Basilar findings are unchanged from CT yesterday. 5 mm right upper lobe pulmonary nodule series 6, image 41. Sub solid nodularity slightly more inferiorly in the right lung series 6, image 45 measuring 8 mm. Tiny 2 mm nodule in the right upper lung series 6, image 32, may be fissural. Pulmonary nodules are new from prior chest CT. Perifissural in subpleural opacity in the right upper lobe, also series  6, image 32,  nonspecific. No pulmonary edema. No pleural fluid. Trachea and mainstem bronchi are patent. Musculoskeletal: There are no acute or suspicious osseous abnormalities. Review of the MIP images confirms the above findings. CT ABDOMEN and PELVIS FINDINGS Hepatobiliary: Hepatic parenchyma diffusely heterogeneous with nodular contours. Liver is increased in size from December 2019 CT. Gallbladder is decompressed and not well assessed, intraluminal high density may be stones or sludge. Questionable wall thickening may be related to nondistention. Pancreas: No ductal dilatation or inflammation. Spleen: Normal in size without focal abnormality. Adrenals/Urinary Tract: Normal adrenal glands. No hydronephrosis or perinephric edema. Homogeneous renal enhancement with symmetric excretion on delayed phase imaging. Urinary bladder is partially distended, mild bladder wall thickening. Stomach/Bowel: Lack of enteric contrast and paucity of intra-abdominal fat limits detailed bowel assessment. The stomach is nondistended. Questionable pre-pyloric gastric wall thickening. No small bowel obstruction or inflammatory change. Appendix not discretely visualized, no pericecal inflammation to suggest appendicitis. Transverse colon is tortuous. Mild colonic wall thickening at the splenic flexure, colon is decompressed, this is not well assessed. Questionable areas of descending colonic wall thickening, for example series 11, image 61. Vascular/Lymphatic: Right portal vein is patent. The left portal vein is attenuated without evidence of thrombus. Tortuous splenic vein with prominent collaterals in the left upper quadrant and left retroperitoneum. 9 mm perigastric lymph node series 11, image 29, nonspecific. No pelvic adenopathy. Reproductive: Uterus surgically absent. Left ovary tentatively visualized and normal. Right ovary not definitively seen. No obvious adnexal mass. Other: Small volume of pelvic ascites measuring simple fluid density,  similar to yesterday. Small perihepatic ascites anteriorly. No free air. Musculoskeletal: There are no acute or suspicious osseous abnormalities. Scattered bone islands in the pelvis. Review of the MIP images confirms the above findings. IMPRESSION: CT chest: 1. No pulmonary embolus. 2. Mild patchy areas of subpleural nodular and ground-glass opacities in both lungs. Slightly more confluent subpleural/perifissural right upper lobe opacity. Findings may be due to atelectasis or infection. 3. Small pulmonary nodules are new from December, infectious/inflammatory or metastatic. CT abdomen/pelvis: 1. Diffusely heterogeneous liver with nodular contours. Liver has increased in size since December 2019 CT. This may be due to underlying cirrhotic change versus diffuse metastatic disease. Recommend further evaluation with abdominal MRI. Left upper quadrant and retroperitoneal collaterals can be seen with portal hypertension. Left portal vein is attenuated, however no discrete thrombus. 2. Questionable pre-pyloric gastric wall thickening, peptic ulcer disease versus gastritis. 3. Bladder wall thickening, small volume abdominopelvic ascites, and areas of colonic wall thickening at the splenic flexure and descending colon are unchanged from CT yesterday. 4. Gallbladder is decompressed and not well assessed, intraluminal high density may be stones or sludge. Questionable gallbladder wall thickening may be related to nondistention or liver disease. This could be further evaluated with MRI or right upper quadrant ultrasound. Electronically Signed   By: Keith Rake M.D.   On: 12/19/2018 05:33   Mr Abdomen W Wo Contrast  Result Date: 12/19/2018 CLINICAL DATA:  Abdominal pain in a patient with breast cancer. EXAM: MRI ABDOMEN WITHOUT AND WITH CONTRAST TECHNIQUE: Multiplanar multisequence MR imaging of the abdomen was performed both before and after the administration of intravenous contrast. CONTRAST:  4m GADAVIST  GADOBUTROL 1 MMOL/ML IV SOLN COMPARISON:  CT of 12/19/2018 FINDINGS: Lower chest: Limited assessment of the lower chest on MRI is unremarkable. Hepatobiliary: Diffuse infiltration of much of the liver, nearly the entire left hepatic lobe, medial section and lateral section in addition to anterior right hepatic lobe with  confluent infiltrative restricted diffusion and heterogeneous enhancement measuring approximately 21 by 7.4 cm. Diffuse metastatic disease is strongly suspected. Boundaries are well-demarcated without adjacent edema in the bordering hepatic parenchyma Discrete hepatic lesions are also scattered about the right hepatic lobe largest in the dome of the right hemi liver measuring approximately 2.5 cm in greatest dimension. These discrete lesions display a targetoid appearance. There are numerous lesions in the right hepatic lobe, lesions are found in all hepatic subsegment. Pancreas: No mass, inflammatory changes, or other parenchymal abnormality identified. Mild ductal distension in the tail of the pancreas may be due to adjacent mass effect. No visible pancreatic lesion is noted. Spleen:  Within normal limits in size and appearance. Adrenals/Urinary Tract: No masses identified. No evidence of hydronephrosis. Stomach/Bowel: Limited assessment of the gastrointestinal tract without signs of acute process. Vascular/Lymphatic: Left-sided peri-renal venous collaterals of uncertain significance perhaps related to elevated portal pressures in the setting of diffuse hepatic involvement. 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 Right portal vein is patent. Scattered small lymph nodes in the upper abdomen. There is either slow flow or thrombus within the main portal vein at the level of the splenic portal confluence extending in the splenic vein. The SMV is patent. There is mass effect upon the vessel at the level of the splenic portal confluence due to hepatic enlargement. This is best seen on subtraction images, image  54, series 20. Note that slow flow in an otherwise opacified vessel can cause artifact with a similar appearance however, this is seen on contrasted and non contrasted imaging with a similar appearance. Other:  None. Musculoskeletal: No signs of acute or destructive bone process. IMPRESSION: 1. Diffuse infiltration of much of the liver, nearly the entire left hepatic lobe, with confluent infiltrative restricted diffusion and heterogeneous enhancement measuring approximately 21 by 7.4 cm. Diffuse metastatic disease is strongly suspected with multifocal lesions in the right hepatic lobe. Superimposed infection is difficult to exclude though areas at the boundary of these lesions show no overt signs of edema. 2. Highly narrowed or occluded left portal vein with potential thrombus versus slow flow in the splenoportal confluence. This was patent on the exam of 12/19/2018; however, given above findings a venous phase CT or even ultrasound hepatic Doppler with special attention to the splenic portal confluence may be helpful to exclude this possibility, finding is also exhibited on TrueFISP sequence (image 24, series 10). 3. 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 4. Mild ductal distension in the tail of the pancreas, potentially due to mass effect. Attention on follow-up. 5. Critical Value/emergent results were called by telephone at the time of interpretation on 12/19/2018 at 6:43 pm to [place] , who verbally acknowledged these results. Electronically Signed   By: Zetta Bills M.D.   On: 12/19/2018 18:24   US Abdomen Complete  Result Date: 12/19/2018 CLINICAL DATA:  Abdominal pain EXAM: ABDOMEN ULTRASOUND COMPLETE COMPARISON:  MRI earlier today FINDINGS: Gallbladder: Gallbladder is contracted with associated mild wall thickening measuring up to 4 mm. Sludge noted within the gallbladder. No visible  stones. Common bile duct: Diameter: Normal caliber, 3 mm Liver: Diffusely heterogeneous echotexture throughout the liver corresponding to the abnormality seen on today's MRI suspicious forinfiltrating mass. Main portal vein is patent on color Doppler imaging with normal direction of blood flow towards the liver. The area of possible thrombus seen on MRI at the level of the splenic portal confluence not  visualized. IVC: No abnormality visualized. Pancreas: Visualized portion unremarkable. Spleen: Size and appearance within normal limits. Right Kidney: Length: 11.0 cm. Echogenicity within normal limits. No mass or hydronephrosis visualized. Left Kidney: Length: 10.5 cm. Echogenicity within normal limits. No mass or hydronephrosis visualized. Abdominal aorta: No aneurysm visualized. Other findings: None. IMPRESSION: Heterogeneous appearance throughout much of the left hepatic lobe as seen on today's MRI concerning for infiltrating mass/tumor. Main portal vein is patent. Contracted gallbladder with gallbladder wall thickening likely related to contracted state. Sludge within the gallbladder. No sonographic Murphy sign or visible stones. Electronically Signed   By: Rolm Baptise M.D.   On: 12/19/2018 20:35   Ct Abdomen Pelvis W Contrast  Result Date: 12/21/2018 CLINICAL DATA:  48 y.o. female discharged yesterday for concerns of abdominal pain with a work-up regarding her liver as well as she is meeting SIRS criteria with feverPatient comes back today reports she continues to have significant pain and discomfort. She has been having ongoing and severe pain located in the mid to right upper abdomen, also she reports fairly significant pain when she takes a deep breath over the right lower lungShe has been feeling warm had a fever when she was in the hospital yesterday. Some nausea. No headaches. Tested negative for Covid a few days ago pain is located primarily in the right upper abdomen under the right rib cage. EXAM:  CT ANGIOGRAPHY CHEST CT ABDOMEN AND PELVIS WITH CONTRAST TECHNIQUE: Multidetector CT imaging of the chest was performed using the standard protocol during bolus administration of intravenous contrast. Multiplanar CT image reconstructions and MIPs were obtained to evaluate the vascular anatomy. Multidetector CT imaging of the abdomen and pelvis was performed using the standard protocol during bolus administration of intravenous contrast. CONTRAST:  48m OMNIPAQUE IOHEXOL 350 MG/ML SOLN COMPARISON:  Chest CTA and abdomen and pelvis CT dated 12/19/2018. FINDINGS: CTA CHEST FINDINGS Cardiovascular: There is satisfactory opacification of the pulmonary arteries to the segmental level. No evidence of a pulmonary embolism. Mediastinum/Nodes: No enlarged mediastinal, hilar, or axillary lymph nodes. Thyroid gland, trachea, and esophagus demonstrate no significant findings. Lungs/Pleura: Small bilateral pleural effusions. There is dependent opacity in the lower lobes consistent with atelectasis. Small areas of ground-glass opacity and small lung nodules noted. Dominant nodule in the right upper lobe, image 46, series 4, 6 mm. Several other small nodules are new from the prior CT. Area of ground-glass opacity adjacent to the oblique fissure in the left upper lobe is new. No pneumothorax. Musculoskeletal: No chest wall abnormality. No acute or significant osseous findings. Review of the MIP images confirms the above findings. CT ABDOMEN and PELVIS FINDINGS Hepatobiliary: Enlarged liver. There are multiple hypoattenuating areas throughout the liver. These areas are more apparent and more extensive than on the recent prior CT, particularly evident at the dome of segment 4A, along the anterior aspect of the right lobe and medial segment of the left lobe and along the inferior aspect of the right lobe, segment 5 and 6. Gallbladder is mostly collapsed. There is increased attenuation material within the gallbladder similar to the  prior CT. No bile duct dilation Pancreas: Unremarkable. No pancreatic ductal dilatation or surrounding inflammatory changes. Spleen: Normal in size without focal abnormality. Adrenals/Urinary Tract: No adrenal masses. Right kidney displaced inferiorly by the enlarged liver. Symmetric renal enhancement and excretion. No renal masses, stones or hydronephrosis. Normal ureters. Bladder wall is prominent, unchanged from the recent prior study. No bladder masses. Stomach/Bowel: Stomach is unremarkable, mostly decompressed. Small bowel and  colon are normal in caliber. No wall thickening or inflammation. Appendix not discretely seen. No findings of appendicitis. Vascular/Lymphatic: As noted on the prior CT, left portal vein is attenuated, but does show enhancement. No evidence of thrombosis. There are venous collaterals in the upper abdomen. Aorta is unremarkable. No enlarged lymph nodes. Prominent Peri celiac node noted on the prior study is stable. Reproductive: Status post hysterectomy. No adnexal masses. Other: Small amount of ascites increased when compared to the prior CT. Musculoskeletal: No acute or significant osseous findings. Review of the MIP images confirms the above findings. IMPRESSION: CHEST CTA 1. No evidence of a pulmonary embolism. 2. Small pleural effusions, new from the prior CT. There is increased opacity in the lower lobes that is consistent with atelectasis. There several new small nodules and small areas of ground-glass opacity when compared to the prior CT. Suspect these are inflammatory given change from the recent exam. ABDOMEN AND PELVIS CT 1. Multiple hypoattenuating liver lesions. These appear to have progressed when compared to the prior CT, although this change could be due to differences in contrast timing. The liver is enlarged, stable from the recent exam. Findings are concerning for multifocal hepatocellular carcinoma. Follow-up liver MRI without and with contrast, when the patient can  tolerate the procedure, is recommended. 2. Small amount ascites has increased in quantity from the prior study. 3. There are vascular collaterals in the upper abdomen, stable. Electronically Signed   By: Lajean Manes M.D.   On: 12/21/2018 18:36   Ct Abdomen Pelvis W Contrast  Result Date: 12/19/2018 CLINICAL DATA:  Chest pain, complex, intermediate/high prob of ACS/PE/AAS; Abd distension Epigastric pain. Exertional dyspnea. Cough. Weakness. History of breast cancer. EXAM: CT ANGIOGRAPHY CHEST CT ABDOMEN AND PELVIS WITH CONTRAST TECHNIQUE: Multidetector CT imaging of the chest was performed using the standard protocol during bolus administration of intravenous contrast. Multiplanar CT image reconstructions and MIPs were obtained to evaluate the vascular anatomy. Multidetector CT imaging of the abdomen and pelvis was performed using the standard protocol during bolus administration of intravenous contrast. No reported contrast reaction. CONTRAST:  174m OMNIPAQUE IOHEXOL 350 MG/ML SOLN COMPARISON:  Abdominal CT without contrast yesterday. Chest CT 01/01/2018 FINDINGS: CTA CHEST FINDINGS Cardiovascular: There are no filling defects within the pulmonary arteries to suggest pulmonary embolus. Thoracic aorta is normal in caliber without dissection. Left vertebral artery arises directly from the thoracic aorta, variant arch anatomy. Heart is normal in size. No pericardial effusion. Right chest port in place with tip in the SVC. Mediastinum/Nodes: No mediastinal or hilar adenopathy. No visualized thyroid nodule. No esophageal wall thickening. Lungs/Pleura: Mild patchy areas of subpleural nodular and ground-glass opacities. Basilar findings are unchanged from CT yesterday. 5 mm right upper lobe pulmonary nodule series 6, image 41. Sub solid nodularity slightly more inferiorly in the right lung series 6, image 45 measuring 8 mm. Tiny 2 mm nodule in the right upper lung series 6, image 32, may be fissural. Pulmonary  nodules are new from prior chest CT. Perifissural in subpleural opacity in the right upper lobe, also series 6, image 32, nonspecific. No pulmonary edema. No pleural fluid. Trachea and mainstem bronchi are patent. Musculoskeletal: There are no acute or suspicious osseous abnormalities. Review of the MIP images confirms the above findings. CT ABDOMEN and PELVIS FINDINGS Hepatobiliary: Hepatic parenchyma diffusely heterogeneous with nodular contours. Liver is increased in size from December 2019 CT. Gallbladder is decompressed and not well assessed, intraluminal high density may be stones or sludge. Questionable wall thickening  may be related to nondistention. Pancreas: No ductal dilatation or inflammation. Spleen: Normal in size without focal abnormality. Adrenals/Urinary Tract: Normal adrenal glands. No hydronephrosis or perinephric edema. Homogeneous renal enhancement with symmetric excretion on delayed phase imaging. Urinary bladder is partially distended, mild bladder wall thickening. Stomach/Bowel: Lack of enteric contrast and paucity of intra-abdominal fat limits detailed bowel assessment. The stomach is nondistended. Questionable pre-pyloric gastric wall thickening. No small bowel obstruction or inflammatory change. Appendix not discretely visualized, no pericecal inflammation to suggest appendicitis. Transverse colon is tortuous. Mild colonic wall thickening at the splenic flexure, colon is decompressed, this is not well assessed. Questionable areas of descending colonic wall thickening, for example series 11, image 61. Vascular/Lymphatic: Right portal vein is patent. The left portal vein is attenuated without evidence of thrombus. Tortuous splenic vein with prominent collaterals in the left upper quadrant and left retroperitoneum. 9 mm perigastric lymph node series 11, image 29, nonspecific. No pelvic adenopathy. Reproductive: Uterus surgically absent. Left ovary tentatively visualized and normal. Right  ovary not definitively seen. No obvious adnexal mass. Other: Small volume of pelvic ascites measuring simple fluid density, similar to yesterday. Small perihepatic ascites anteriorly. No free air. Musculoskeletal: There are no acute or suspicious osseous abnormalities. Scattered bone islands in the pelvis. Review of the MIP images confirms the above findings. IMPRESSION: CT chest: 1. No pulmonary embolus. 2. Mild patchy areas of subpleural nodular and ground-glass opacities in both lungs. Slightly more confluent subpleural/perifissural right upper lobe opacity. Findings may be due to atelectasis or infection. 3. Small pulmonary nodules are new from December, infectious/inflammatory or metastatic. CT abdomen/pelvis: 1. Diffusely heterogeneous liver with nodular contours. Liver has increased in size since December 2019 CT. This may be due to underlying cirrhotic change versus diffuse metastatic disease. Recommend further evaluation with abdominal MRI. Left upper quadrant and retroperitoneal collaterals can be seen with portal hypertension. Left portal vein is attenuated, however no discrete thrombus. 2. Questionable pre-pyloric gastric wall thickening, peptic ulcer disease versus gastritis. 3. Bladder wall thickening, small volume abdominopelvic ascites, and areas of colonic wall thickening at the splenic flexure and descending colon are unchanged from CT yesterday. 4. Gallbladder is decompressed and not well assessed, intraluminal high density may be stones or sludge. Questionable gallbladder wall thickening may be related to nondistention or liver disease. This could be further evaluated with MRI or right upper quadrant ultrasound. Electronically Signed   By: Keith Rake M.D.   On: 12/19/2018 05:33   US Biopsy (liver)  Result Date: 12/20/2018 INDICATION: History of left breast carcinoma with imaging evidence of probable diffuse metastatic disease in the liver, more prominently in the left lobe. The patient  presents for biopsy. EXAM: ULTRASOUND GUIDED CORE BIOPSY OF LIVER MEDICATIONS: None. ANESTHESIA/SEDATION: Fentanyl 100 mcg IV; Versed 2.0 mg IV Moderate Sedation Time:  20 minutes. The patient was continuously monitored during the procedure by the interventional radiology nurse under my direct supervision. PROCEDURE: The procedure, risks, benefits, and alternatives were explained to the patient. Questions regarding the procedure were encouraged and answered. The patient understands and consents to the procedure. A time-out was performed prior to initiating the procedure. Ultrasound was used to localize lesions within the liver. The anterior abdominal wall was prepped with chlorhexidine in a sterile fashion, and a sterile drape was applied covering the operative field. A sterile gown and sterile gloves were used for the procedure. Local anesthesia was provided with 1% Lidocaine. Under direct ultrasound guidance, a 17 gauge trocar needle was advanced into the  left lobe of the liver. After confirming needle tip position, coaxial 18 gauge core biopsy samples were obtained. A total of 3 samples were obtained and submitted in formalin. A slurry of Gel-Foam pledgets was then slowly injected via the outer needle as the needle was retracted. Additional ultrasound was performed. COMPLICATIONS: None immediate. FINDINGS: Ultrasound demonstrates heterogeneous appearance of the liver with ill-defined lesions throughout the left lobe. Solid core biopsy samples were obtained by sampling lesions within the lateral segment of the left lobe. IMPRESSION: Ultrasound-guided core biopsy performed of hepatic lesions within the left lobe. Electronically Signed   By: Aletta Edouard M.D.   On: 12/20/2018 13:44   Dg Chest Portable 1 View  Result Date: 12/21/2018 CLINICAL DATA:  Pleuritic right chest pain EXAM: PORTABLE CHEST 1 VIEW COMPARISON:  12/18/2018 chest radiograph. FINDINGS: Stable right subclavian Port-A-Cath terminating over  the right atrium. Stable cardiomediastinal silhouette with normal heart size. No pneumothorax. No pleural effusion. No pulmonary edema. Curvilinear left lung base opacities. No acute consolidative airspace disease. IMPRESSION: Curvilinear left lung base opacities, favor scarring or atelectasis. No acute consolidative airspace disease. Electronically Signed   By: Ilona Sorrel M.D.   On: 12/21/2018 13:06   Dg Chest Portable 1 View  Result Date: 12/18/2018 CLINICAL DATA:  Fever abdominal pain EXAM: PORTABLE CHEST 1 VIEW COMPARISON:  12/31/2017 FINDINGS: Right-sided central venous port with tip over the cavoatrial region. No focal airspace disease or effusion. Normal heart size. No pneumothorax. IMPRESSION: No active disease. Electronically Signed   By: Donavan Foil M.D.   On: 12/18/2018 19:49   [place] Ascites (abdomen Limited)  Result Date: 12/30/2018 INDICATION: Ascites. EXAM: ULTRASOUND GUIDED PARACENTESIS MEDICATIONS: None. COMPLICATIONS: None immediate. PROCEDURE: Informed written consent was obtained from the patient after a discussion of the risks, benefits and alternatives to treatment. A timeout was performed prior to the initiation of the procedure. Ultrasound reveals a mild amount of ascites. Given the mild amount of ascites and given patient's low platelet count, it was elected to not perform paracentesis at this time. FINDINGS: Mild amount of ascites. Given patient's low platelet count it was not deemed safe to perform paracentesis at this time. Follow-up exam can be obtained if symptoms worsens. IMPRESSION: Mild amount of ascites. Given patient's low platelet count it was not deemed safe to perform paracentesis this time. Electronically Signed   By: Marcello Moores  Register   On: 12/30/2018 14:37    PERFORMANCE STATUS (ECOG) : 1 - Symptomatic but completely ambulatory  Review of Systems Unless otherwise noted, a complete review of systems is negative.  Physical Exam General: NAD, frail appearing,  thin Pulmonary: Unlabored, on O2 Extremities: no edema, no joint deformities Skin: no rashes Neurological: Weakness but otherwise nonfocal  IMPRESSION: I met with patient to discuss goals.   She was tearful during our conversation and seems to recognize that her prognosis is poor. She worries greatly about the welfare of her 72 year old son, who still lives in the home. She says that he is not aware of the severity of her illness. She has had a more direct conversation with her older son. She plans to also talk with her younger son at some point in the future. We discussed the resources available through [facility] if she needs assistance or support for her children.   We had previously discussed establishing ACP documents. She would want her sister to be her 37 but has not yet had a chance to complete documents. She would want to spare her older  son from decision making.   Patient verbalized fear with the outcome of her disease and treatment. However, she feels strongly that she would like to continue treatment in an attempt to fight the cancer.   Symptomatically, patient reports some abdominal bloating and gassy pain after drinking a carbonated beverage. Will try simethicone.    PLAN: -Continue current scope of treatment -Simethicone prn -Will plan to follow in the outpatient clinic   Time Total: 45 minutes  Visit consisted of counseling and education dealing with the complex and emotionally intense issues of symptom management and palliative care in the setting of serious and potentially life-threatening illness.Greater than 50%  of this time was spent counseling and coordinating care related to the above assessment and plan.  Signed by: Altha Harm, PhD, NP-C

## 2019-01-01 NOTE — Consult Note (Signed)
NAME: Shelby [name]  DOB: Aug 31, 1970  MRN: [id number]  Date/Time: 01/01/2019 12:57 PM  REQUESTING PROVIDER: Loleta Books Subjective:  REASON FOR CONSULT: ESBL kleb UTI ? [place] Shelby [name] is a 48 y.o. female with a history of Stage IV triple negative breast who was recently in hospital for fever of nearly a week and thought to be due to extensive tumor burden in the liver and started on decadron and was discharged home on 11/27 is readmitted. Pt returned on 11/29 with increasing sob, worsening abdominal pain and distension In the ER today she was noted to have elevated WBC, lactic acidosis and abdominal pain with ascites.  CT of the abdomen pelvis showed moderate ascites with widespread metastatic disease to the liver.  Attempted paracentesis in the ER but due to malpositioning it was abandoned.  Oncology was consulted who recommended admission for pain control and sepsis.  She was started on broad-spectrum antibiotics with zosyn. Then she was switched to ceftriaxone for possible SBP. Urine culture showed 10K of ESBL klebsiella and I am asked to see the patient for that  Pt has no fever but has SOB even on minimal exertion and o2 drops to the high 80s.  Medical history  as follows   She was diagnosed with stage IIIb breast cancer on the left side and Nov 2019.PORT placed 12/31/17 Patient completed her neoadjuvant chemotherapy with Adriamycin and Cytoxan, followed by carboplatinum and Taxol, on Jun 20, 2018. her initial resection was completed on July 12, 2018 and then she underwent reexcision for positive margins on July 24, 2018. Patient completed adjuvant XRT on October 16, 2018. She is now on adjuvant capecitabine  twice daily for 14 days with 7 days off. .She has completed cycle 2 of capecitabine. She was seen at her oncologist office on 11/6 with fatigue and weakness. Her symptoms started since she went on Capecitabine. On 11/17 she had virtual visit with her PCP c/o fever and abdominal  pain. On 11/18 she came to the ED , temp was 99.4, BP 149/95, RR 18. Labs revealed WBC of 3.6 hemoglobin 9.5 platelets 46 creatinine 1.7 LFTs were showing AST of 188 ALT 119 increased from baseline chest x-ray was unremarkable COVID-19 test is pending. EKG shows normal sinus rhythm. CT abdomen pelvis  showsed features concerning for cirrhosis of the liver and hypoattenuation lesions in the liver   MRI showed Diffuse infiltration of much of the liver, nearly the entire left hepatic lobe, medial section and lateral section in addition to anterior right hepatic lobe with confluent infiltrative estricted diffusion and heterogeneous enhancement measuring approximately 21 by 7.4 cm. Discrete hepatic lesions are also scattered about the right hepatic lobe largest in the dome of the right hemi liver measuring approximately 2.5 cm in greatest dimension. These discrete lesions display a targetoid appearance. There are numerous lesions in the right hepatic lobe, lesions are found in all hepatic subsegment. There is either slow flow or thrombus within the main portal vein at the level of the splenic portal confluence extending in the splenic Vein. Had liver biopsy on 12/20/18. Was discharged on 11/20, back again with severe abdominal pain and concern for possible bleeding due to low platelet Past Medical History:  Diagnosis Date  . Anemia    h/o with pregnancy  . Anxiety   . Asthma    allergy induced-no inhaler  . Cancer [facility])    breast left   . Complication of anesthesia   . Environmental allergies   . Family history of  adverse reaction to anesthesia    son-breathing problems-coded 1st time when he was 2 and had another surgery at 25 and had to be admitted for breathing problems  . Family history of breast cancer   . GERD (gastroesophageal reflux disease)   . Headache    migraines  . Hypertension   . Mitral valve disease   . Mitral valve prolapse   . Painful menstrual periods   . PONV  (postoperative nausea and vomiting)   . Vertigo     Past Surgical History:  Procedure Laterality Date  . ABDOMINAL HYSTERECTOMY  2010   supracervical   . AXILLARY LYMPH NODE DISSECTION Left 07/12/2018   Procedure: AXILLARY LYMPH NODE DISSECTION;  Surgeon: Robert Bellow, MD;  Location: [facility] ORS;  Service: General;  Laterality: Left;  . BREAST BIOPSY Left 11/2017   invasive ductal carcinoma and metastatic LN  . BREAST BIOPSY WITH SENTINEL LYMPH NODE BIOPSY AND NEEDLE LOCALIZATION Left 07/12/2018   Procedure: BREAST BIOPSY WIDE EXCISION WITH SENTINEL NODE  AND NEEDLE LOCALIZATION OF OXILLARY NODS LEFT;  Surgeon: Robert Bellow, MD;  Location: [facility] ORS;  Service: General;  Laterality: Left;  . BUNIONECTOMY Bilateral   . MANDIBLE RECONSTRUCTION     age 24-underbite  . PORTACATH PLACEMENT Right 12/31/2017   Procedure: INSERTION PORT-A-CATH;  Surgeon: Robert Bellow, MD;  Location: [facility] ORS;  Service: General;  Laterality: Right;  . RE-EXCISION OF BREAST LUMPECTOMY Left 07/24/2018   Procedure: RE-EXCISION OF BREAST LUMPECTOMY LEFT;  Surgeon: Robert Bellow, MD;  Location: [facility] ORS;  Service: General;  Laterality: Left;  . SHOULDER ARTHROSCOPY WITH OPEN ROTATOR CUFF REPAIR Right 04/26/2017   Procedure: SHOULDER ARTHROSCOPY WITH OPEN ROTATOR CUFF REPAIR,SUBACROMINAL DECOMPRESSION;  Surgeon: Thornton Park, MD;  Location: [facility] ORS;  Service: Orthopedics;  Laterality: Right;  . TONSILLECTOMY      Social History   Socioeconomic History  . Marital status: Divorced    Spouse name: Not on file  . Number of children: Not on file  . Years of education: Not on file  . Highest education level: Not on file  Occupational History  . Not on file  Social Needs  . Financial resource strain: Not on file  . Food insecurity    Worry: Not on file    Inability: Not on file  . Transportation needs    Medical: Not on file    Non-medical: Not on file  Tobacco Use  . Smoking status: Former  Smoker    Packs/day: 0.50    Years: 10.00    Pack years: 5.00    Types: Cigarettes    Quit date: 04/20/2007    Years since quitting: 11.7  . Smokeless tobacco: Never Used  . Tobacco comment: social smoker back then  Substance and Sexual Activity  . Alcohol use: Yes    Comment: occas  . Drug use: No  . Sexual activity: Yes  Lifestyle  . Physical activity    Days per week: Not on file    Minutes per session: Not on file  . Stress: Not on file  Relationships  . Social Herbalist on phone: Not on file    Gets together: Not on file    Attends religious service: Not on file    Active member of club or organization: Not on file    Attends meetings of clubs or organizations: Not on file    Relationship status: Not on file  . Intimate partner violence  Fear of current or ex partner: Not on file    Emotionally abused: Not on file    Physically abused: Not on file    Forced sexual activity: Not on file  Other Topics Concern  . Not on file  Social History Narrative  . Not on file    Family History  Problem Relation Age of Onset  . Breast cancer Mother 22  . Diabetes Father   . Cancer Maternal Uncle        colon  . Breast cancer Maternal Grandmother 70   Allergies  Allergen Reactions  . Hydrocodone Hives    Swollen face - many years ago. Thinks she has tolerated since.  . Iodine Hives  . Peanut Butter Flavor Other (See Comments)    Made mouth tingle  . Shellfish Allergy Hives  . Vancomycin Other (See Comments)    Itching and forehead turned red    ? Current Facility-Administered Medications  Medication Dose Route Frequency Provider Last Rate Last Dose  . 0.9 %  sodium chloride infusion   Intravenous PRN Damita Lack, MD   Stopped at 12/31/18 0101  . ALPRAZolam (XANAX) tablet 0.25-0.5 mg  0.25-0.5 mg Oral BID PRN Amin, Ankit Chirag, MD   0.5 mg at 12/30/18 1300  . bisacodyl (DULCOLAX) EC tablet 5 mg  5 mg Oral Daily PRN Damita Lack, MD   5 mg at  12/31/18 0858  . cefTRIAXone (ROCEPHIN) 1 g in sodium chloride 0.9 % 100 mL IVPB  1 g Intravenous Q24H Lorella Nimrod, MD 200 mL/hr at 12/31/18 1813 1 g at 12/31/18 1813  . Chlorhexidine Gluconate Cloth 2 % PADS 6 each  6 each Topical Daily Damita Lack, MD   6 each at 01/01/19 [phone]  . cholecalciferol (VITAMIN D) tablet 2,000 Units  2,000 Units Oral QPM Damita Lack, MD   2,000 Units at 12/30/18 1832  . dexamethasone (DECADRON) tablet 8 mg  8 mg Oral Q8H Amin, Ankit Chirag, MD   8 mg at 01/01/19 1254  . famotidine (PEPCID) tablet 20 mg  20 mg Oral Daily PRN Amin, Ankit Chirag, MD   20 mg at 12/31/18 1158  . feeding supplement (ENSURE ENLIVE) (ENSURE ENLIVE) liquid 237 mL  237 mL Oral BID BM Amin, Ankit Chirag, MD   237 mL at 12/31/18 1158  . fentaNYL (DURAGESIC) 25 MCG/HR 1 patch  1 patch Transdermal Q72H Lloyd Huger, MD   1 patch at 01/01/19 1254  . fentaNYL (SUBLIMAZE) injection 25 mcg  25 mcg Intravenous Q4H PRN Amin, Ankit Chirag, MD      . HYDROmorphone (DILAUDID) injection 1 mg  1 mg Intravenous Q1H PRN [name] [place], MD   1 mg at 12/30/2018 1600  . HYDROmorphone (DILAUDID) tablet 1 mg  1 mg Oral Q4H PRN Amin, Ankit Chirag, MD      . insulin aspart (novoLOG) injection 0-9 Units  0-9 Units Subcutaneous TID WC Amin, Jeanella Flattery, MD   1 Units at 12/31/18 1832  . losartan (COZAAR) tablet 50 mg  50 mg Oral Daily Amin, Ankit Chirag, MD   50 mg at 01/01/19 0842  . multivitamin with minerals tablet 1 tablet  1 tablet Oral Daily Damita Lack, MD   1 tablet at 01/01/19 0842  . omega-3 acid ethyl esters (LOVAZA) capsule 1,000 mg  1,000 mg Oral QPM Amin, Ankit Chirag, MD   1,000 mg at 12/30/18 1831  . ondansetron (ZOFRAN) tablet 4 mg  4 mg Oral  Q6H PRN Damita Lack, MD   4 mg at 01/01/19 0539   Or  . ondansetron (ZOFRAN) injection 4 mg  4 mg Intravenous Q6H PRN Amin, Ankit Chirag, MD      . pantoprazole (PROTONIX) EC tablet 40 mg  40 mg Oral Daily Amin, Ankit Chirag, MD   40  mg at 01/01/19 0842  . polyethylene glycol (MIRALAX / GLYCOLAX) packet 17 g  17 g Oral Daily Lorella Nimrod, MD   17 g at 12/31/18 0845  . sodium chloride flush (NS) 0.9 % injection 10 mL  10 mL Intravenous q morning - 10a Amin, Ankit Chirag, MD   10 mL at 01/01/19 0843  . sodium chloride flush (NS) 0.9 % injection 10 mL  10 mL Intravenous PRN Amin, Ankit Chirag, MD   10 mL at 12/20/2018 1735  . sucralfate (CARAFATE) tablet 1 g  1 g Oral TID WC & HS Amin, Ankit Chirag, MD   1 g at 01/01/19 1254  . traMADol (ULTRAM) tablet 50 mg  50 mg Oral Q12H PRN Amin, Jeanella Flattery, MD         Abtx:  Anti-infectives (From admission, onward)   Start     Dose/Rate Route Frequency Ordered Stop   12/31/18 1800  cefTRIAXone (ROCEPHIN) 1 g in sodium chloride 0.9 % 100 mL IVPB    Note to Pharmacy: Please dose appropriately.  Urine with ESBL   1 g 200 mL/hr over 30 Minutes Intravenous Every 24 hours 12/31/18 1152     12/21/2018 2000  ceFEPIme (MAXIPIME) 2 g in sodium chloride 0.9 % 100 mL IVPB  Status:  Discontinued     2 g 200 mL/hr over 30 Minutes Intravenous Every 8 hours 12/28/2018 1152 12/14/2018 1258   12/14/2018 1400  piperacillin-tazobactam (ZOSYN) IVPB 3.375 g  Status:  Discontinued     3.375 g 12.5 mL/hr over 240 Minutes Intravenous Every 8 hours 12/11/2018 1258 12/31/18 1152   12/28/2018 1045  ceFEPIme (MAXIPIME) 2 g in sodium chloride 0.9 % 100 mL IVPB     2 g 200 mL/hr over 30 Minutes Intravenous  Once 12/25/2018 1030 12/22/2018 1208   12/16/2018 1045  metroNIDAZOLE (FLAGYL) IVPB 500 mg     500 mg 100 mL/hr over 60 Minutes Intravenous  Once 12/13/2018 1030 12/21/2018 1320      REVIEW OF SYSTEMS:  Const: negative fever, negative chills, negative weight loss Eyes: negative diplopia or visual changes, negative eye pain ENT: negative coryza, negative sore throat Resp: negative cough, hemoptysis, has dyspnea Cards: negative for chest pain, palpitations, lower extremity edema GU: has some pressure lower abdomen which is  relieved with micturition GI:  abdominal pain, diarrhea,  Constipation, bloating Skin: negative for rash and pruritus Heme: negative for easy bruising and gum/nose bleeding MS: negative for myalgias, arthralgias, back pain and muscle weakness Neurolo:negative for headaches, dizziness, vertigo, memory problems  Psych: negative for feelings of anxiety, depression  Endocrine: negative for thyroid, diabetes Allergy/Immunology-as above: Objective:  VITALS:  BP 120/90 (BP Location: Right Arm)   Pulse 86   Temp (!) 97.5 F (36.4 C)   Resp 17   Ht 5\' 6"  (1.676 m)   Wt 63.2 kg   SpO2 96%   BMI 22.49 kg/m  PHYSICAL EXAM:  General: Alert, cooperative, no distress at rest , nasal cannula  Head: Normocephalic, without obvious abnormality, atraumatic. Eyes: Conjunctivae clear, anicteric sclerae. Pupils are equal ENT Nares normal. No drainage or sinus tenderness. Lips, mucosa, and tongue normal. No  Thrush Neck: Supple, symmetrical, no adenopathy, thyroid: non tender no carotid bruit and no JVD. Back: No CVA tenderness. Lungs:b/l air entry, decreased bases Heart: Regular rate and rhythm, no murmur, rub or gallop. Abdomen: distended- nodular hepatomegaly tender Extremities: edema ankles Skin: No rashes or lesions. Or bruising Lymph: Cervical, supraclavicular normal. Neurologic: Grossly non-focal Pertinent Labs Lab Results CBC    Component Value Date/Time   WBC 13.6 (H) 01/01/2019 0528   RBC 3.94 01/01/2019 0528   HGB 12.6 01/01/2019 0528   HGB 11.8 08/13/2017 1556   HCT 35.8 (L) 01/01/2019 0528   HCT 34.8 12/11/2018 1729   PLT 35 (L) 01/01/2019 0528   PLT 198 08/13/2017 1556   MCV 90.9 01/01/2019 0528   MCV 90 08/13/2017 1556   MCV 92 11/25/2012 1542   MCH 32.0 01/01/2019 0528   MCHC 35.2 01/01/2019 0528   RDW 17.4 (H) 01/01/2019 0528   RDW 12.8 08/13/2017 1556   RDW 12.2 11/25/2012 1542   LYMPHSABS 0.3 (L) 12/23/2018 2102   LYMPHSABS 0.4 (L) 08/13/2017 1556   LYMPHSABS  1.7 11/25/2012 1542   MONOABS 0.5 12/23/2018 2102   MONOABS 0.4 11/25/2012 1542   EOSABS 0.0 12/23/2018 2102   EOSABS 0.2 08/13/2017 1556   EOSABS 0.1 11/25/2012 1542   BASOSABS 0.0 12/23/2018 2102   BASOSABS 0.0 08/13/2017 1556   BASOSABS 0.1 11/25/2012 1542    CMP Latest Ref Rng & Units 12/31/2018 12/30/2018 12/12/2018  Glucose 70 - 99 mg/dL 147(H) 133(H) 121(H)  BUN 6 - 20 mg/dL 17 19 17   Creatinine 0.44 - 1.00 mg/dL 0.58 0.63 0.56  Sodium 135 - 145 mmol/L 131(L) 131(L) 133(L)  Potassium 3.5 - 5.1 mmol/L 4.8 4.6 4.2  Chloride 98 - 111 mmol/L 93(L) 93(L) 93(L)  CO2 22 - 32 mmol/L 27 25 25   Calcium 8.9 - 10.3 mg/dL 8.5(L) 8.6(L) 8.7(L)  Total Protein 6.5 - 8.1 g/dL 5.5(L) 5.8(L) 5.8(L)  Total Bilirubin 0.3 - 1.2 mg/dL 1.0 1.0 1.1  Alkaline Phos 38 - 126 U/L 142(H) 123 118  AST 15 - 41 U/L 151(H) 147(H) 133(H)  ALT 0 - 44 U/L 88(H) 88(H) 85(H)      Microbiology: Recent Results (from the past 240 hour(s))  CULTURE, BLOOD (ROUTINE X 2) w Reflex to ID Panel     Status: None   Collection Time: 12/23/18 10:04 AM   Specimen: BLOOD  Result Value Ref Range Status   Specimen Description BLOOD BLOOD RIGHT HAND  Final   Special Requests   Final    BOTTLES DRAWN AEROBIC AND ANAEROBIC Blood Culture adequate volume   Culture   Final    NO GROWTH 5 DAYS Performed at [facility], [address] [place], [place] [address]    Report Status 12/28/2018 FINAL  Final  CULTURE, BLOOD (ROUTINE X 2) w Reflex to ID Panel     Status: None   Collection Time: 12/23/18 10:11 AM   Specimen: BLOOD  Result Value Ref Range Status   Specimen Description BLOOD BLOOD RIGHT HAND  Final   Special Requests   Final    BOTTLES DRAWN AEROBIC AND ANAEROBIC Blood Culture results may not be optimal due to an excessive volume of blood received in culture bottles   Culture   Final    NO GROWTH 5 DAYS Performed at [facility], [address], [place], [place] [address]    Report Status  12/28/2018 FINAL  Final  Urine culture     Status: Abnormal  Collection Time: 12/01/2018  9:20 AM   Specimen: Urine, Clean Catch  Result Value Ref Range Status   Specimen Description   Final    URINE, CLEAN CATCH Performed at [facility], [address], [place], [place] [address]    Special Requests   Final    NONE Performed at [facility], [address] [place], [place] [address]    Culture (A)  Final    10,000 COLONIES/mL KLEBSIELLA OXYTOCA Confirmed Extended Spectrum Beta-Lactamase Producer (ESBL).  In bloodstream infections from ESBL organisms, carbapenems are preferred over piperacillin/tazobactam. They are shown to have a lower risk of mortality.    Report Status 12/31/2018 FINAL  Final   Organism ID, Bacteria KLEBSIELLA OXYTOCA (A)  Final      Susceptibility   Klebsiella oxytoca - MIC*    AMPICILLIN >=32 RESISTANT Resistant     CEFAZOLIN >=64 RESISTANT Resistant     CEFTRIAXONE 8 SENSITIVE Sensitive     CIPROFLOXACIN <=0.25 SENSITIVE Sensitive     GENTAMICIN <=1 SENSITIVE Sensitive     IMIPENEM <=0.25 SENSITIVE Sensitive     NITROFURANTOIN 32 SENSITIVE Sensitive     TRIMETH/SULFA <=20 SENSITIVE Sensitive     AMPICILLIN/SULBACTAM >=32 RESISTANT Resistant     PIP/TAZO >=128 RESISTANT Resistant     Extended ESBL POSITIVE Resistant     * 10,000 COLONIES/mL KLEBSIELLA OXYTOCA  Blood culture (routine x 2)     Status: None (Preliminary result)   Collection Time: 12/19/2018 11:13 AM   Specimen: BLOOD  Result Value Ref Range Status   Specimen Description BLOOD RIGHT ANTECUBITAL  Final   Special Requests   Final    BOTTLES DRAWN AEROBIC AND ANAEROBIC Blood Culture adequate volume   Culture   Final    NO GROWTH 2 DAYS Performed at [facility], [address] [place], [place] [address]    Report Status PENDING  Incomplete  Blood culture (routine x 2)     Status: None (Preliminary result)   Collection Time: 12/07/2018 11:13 AM   Specimen: BLOOD   Result Value Ref Range Status   Specimen Description BLOOD LEFT ANTECUBITAL  Final   Special Requests   Final    BOTTLES DRAWN AEROBIC AND ANAEROBIC Blood Culture adequate volume   Culture   Final    NO GROWTH 2 DAYS Performed at [facility], [address], [place],  [address]    Report Status PENDING  Incomplete  SARS CORONAVIRUS 2 (TAT 6-24 HRS) Nasopharyngeal Nasopharyngeal Swab     Status: None   Collection Time: 12/25/2018  1:19 PM   Specimen: Nasopharyngeal Swab  Result Value Ref Range Status   SARS Coronavirus 2 NEGATIVE NEGATIVE Final    Comment: (NOTE) SARS-CoV-2 target nucleic acids are NOT DETECTED. The SARS-CoV-2 RNA is generally detectable in upper and lower respiratory specimens during the acute phase of infection. Negative results do not preclude SARS-CoV-2 infection, do not rule out co-infections with other pathogens, and should not be used as the sole basis for treatment or other patient management decisions. Negative results must be combined with clinical observations, patient history, and epidemiological information. The expected result is Negative. Fact Sheet for Patients: SugarRoll.be Fact Sheet for Healthcare Providers: https://www.woods-mathews.com/ This test is not yet approved or cleared by the [place] FDA and  has been authorized for detection and/or diagnosis of SARS-CoV-2 by FDA under an Emergency Use Authorization (EUA). This EUA will remain  in effect (meaning this test can be used) for  the duration of the COVID-19 declaration under Section 56 4(b)(1) of the Act, 21 U.S.C. section 360bbb-3(b)(1), unless the authorization is terminated or revoked sooner. Performed at [facility], [place] [address], [place], [place] [address]     IMAGING RESULTS: CT abdomen Moderate ascites, slightly increased compared to recent study.  2.  Widespread hepatic metastatic disease.  3. No  bowel obstruction. No abscess in the abdomen or pelvis. No periappendiceal region inflammation.  4.  Enlarged periceliac lymph node, suspected due to neoplasm.  5. Venous collaterals in the upper abdomen, primarily on the left,  I have personally reviewed the films ? Impression/Recommendation ? ?ESBL klebsiella in the urine- only 10K colonies- less likely to be a full blown infection- but she has  Symptoms of pressure and some pain on micturition , bladder wall thickening in CT and needing chemo and on steroids will treat as cystitis with 3 days of cipro. Avoid bactrim due to thrombocytopenia  Extensive liver mets a=with portal vein involvement- she is presenting like BUDD chiari with pain, acutely enlarged liver and ascitis and also collaterals Doubt SBP  Thrombocytopenia due to the extensive malignacy look for PHT and splenomegaly  Acute hypoxic resp failure- No PE, has b/l lower lobe atelectasis- for incentive spirometry  Less likely lymphangitis carcinmatosis Could she have AV shunting because of the portal hypertension/collaterals??  Stage IV breast cancer with liver mets. Was On capecitabine?  Fever- has resolved with decadron - thought to be due to tumor buren- if she continue on high dose steroids will need PCP prophylaxis ___________________________________________________ Discussed with patient and care team Note:  This document was prepared using Dragon voice recognition software and may include unintentional dictation errors.

## 2019-01-01 NOTE — Progress Notes (Signed)
Dr Loleta Books made aware attempted to wean pt off o2, pt quickly dropped down to 73% on room air at rest, placed back on 4L came up to 93%

## 2019-01-02 ENCOUNTER — Other Ambulatory Visit: Payer: Self-pay | Admitting: Oncology

## 2019-01-02 DIAGNOSIS — N390 Urinary tract infection, site not specified: Secondary | ICD-10-CM | POA: Diagnosis not present

## 2019-01-02 DIAGNOSIS — R0602 Shortness of breath: Secondary | ICD-10-CM

## 2019-01-02 DIAGNOSIS — C50919 Malignant neoplasm of unspecified site of unspecified female breast: Secondary | ICD-10-CM | POA: Diagnosis not present

## 2019-01-02 DIAGNOSIS — Z1612 Extended spectrum beta lactamase (ESBL) resistance: Secondary | ICD-10-CM | POA: Diagnosis not present

## 2019-01-02 DIAGNOSIS — R109 Unspecified abdominal pain: Secondary | ICD-10-CM | POA: Diagnosis not present

## 2019-01-02 DIAGNOSIS — D696 Thrombocytopenia, unspecified: Secondary | ICD-10-CM | POA: Diagnosis not present

## 2019-01-02 DIAGNOSIS — Z515 Encounter for palliative care: Secondary | ICD-10-CM | POA: Diagnosis not present

## 2019-01-02 DIAGNOSIS — B961 Klebsiella pneumoniae [K. pneumoniae] as the cause of diseases classified elsewhere: Secondary | ICD-10-CM | POA: Diagnosis not present

## 2019-01-02 LAB — CBC
HCT: 34.3 % — ABNORMAL LOW (ref 36.0–46.0)
Hemoglobin: 11.8 g/dL — ABNORMAL LOW (ref 12.0–15.0)
MCH: 31.7 pg (ref 26.0–34.0)
MCHC: 34.4 g/dL (ref 30.0–36.0)
MCV: 92.2 fL (ref 80.0–100.0)
Platelets: 26 10*3/uL — CL (ref 150–400)
RBC: 3.72 MIL/uL — ABNORMAL LOW (ref 3.87–5.11)
RDW: 17.2 % — ABNORMAL HIGH (ref 11.5–15.5)
WBC: 15.2 10*3/uL — ABNORMAL HIGH (ref 4.0–10.5)
nRBC: 0 % (ref 0.0–0.2)

## 2019-01-02 LAB — GLUCOSE, CAPILLARY
Glucose-Capillary: 118 mg/dL — ABNORMAL HIGH (ref 70–99)
Glucose-Capillary: 121 mg/dL — ABNORMAL HIGH (ref 70–99)
Glucose-Capillary: 126 mg/dL — ABNORMAL HIGH (ref 70–99)
Glucose-Capillary: 145 mg/dL — ABNORMAL HIGH (ref 70–99)

## 2019-01-02 LAB — COMPREHENSIVE METABOLIC PANEL
ALT: 108 U/L — ABNORMAL HIGH (ref 0–44)
AST: 179 U/L — ABNORMAL HIGH (ref 15–41)
Albumin: 2.7 g/dL — ABNORMAL LOW (ref 3.5–5.0)
Alkaline Phosphatase: 186 U/L — ABNORMAL HIGH (ref 38–126)
Anion gap: 10 (ref 5–15)
BUN: 24 mg/dL — ABNORMAL HIGH (ref 6–20)
CO2: 29 mmol/L (ref 22–32)
Calcium: 8.7 mg/dL — ABNORMAL LOW (ref 8.9–10.3)
Chloride: 93 mmol/L — ABNORMAL LOW (ref 98–111)
Creatinine, Ser: 0.51 mg/dL (ref 0.44–1.00)
GFR calc Af Amer: >60 mL/min (ref >60)
GFR calc non Af Amer: >60 mL/min (ref >60)
Glucose, Bld: 132 mg/dL — ABNORMAL HIGH (ref 70–99)
Potassium: 4.8 mmol/L (ref 3.5–5.1)
Sodium: 132 mmol/L — ABNORMAL LOW (ref 135–145)
Total Bilirubin: 1 mg/dL (ref 0.3–1.2)
Total Protein: 5.6 g/dL — ABNORMAL LOW (ref 6.5–8.1)

## 2019-01-02 LAB — BRAIN NATRIURETIC PEPTIDE: B Natriuretic Peptide: 39 pg/mL (ref 0.0–100.0)

## 2019-01-02 MED ORDER — PROCHLORPERAZINE MALEATE 10 MG PO TABS
10.0000 mg | ORAL_TABLET | Freq: Once | ORAL | Status: AC
  Administered 2019-01-02: 10 mg via ORAL
  Filled 2019-01-02: qty 1

## 2019-01-02 MED ORDER — SODIUM CHLORIDE 0.9 % IV SOLN
1.1000 mg/m2 | Freq: Once | INTRAVENOUS | Status: AC
  Administered 2019-01-02: 1.9 mg via INTRAVENOUS
  Filled 2019-01-02: qty 3.8

## 2019-01-02 NOTE — Progress Notes (Signed)
Dr Loleta Books made aware that pt got up to the bathroom on 4L, desat to 63% with exertion, back to bed on 4L 95%

## 2019-01-02 NOTE — Progress Notes (Signed)
MD aware that pts o2 dropped to 78% on room air, placed back on 4L o2, o2 back to 94%, acknowledged

## 2019-01-02 NOTE — Progress Notes (Signed)
MD aware of low platelets.  Basically patient has no other option but to get chemo.  To get treatment today.

## 2019-01-02 NOTE — Progress Notes (Signed)
PROGRESS NOTE    Shelby [name]  [id number] DOB: December 05, 1970 DOA: 12/25/2018 PCP: Jerrol Banana., MD      Brief Narrative:  Shelby [name] is a 48 y.o. F with metastatic triple negative breast cancer, HTN who presents with abdominal pain, distention and nausea.  Patient was admitted here few days ago and discharged on 11/27.After going home she continued report of mid abdominal bloating, fullness and pain with poor appetite. This continued to progress therefore came back to the hospital may be subjective chills but denies any fevers and other complaints.  During her prior admission she was having some fever and it was thought to be secondary to tumor burden and she was started on steroids.  During this readmission she was having leukocytosis, lactic acidosis with abdominal pain and ascites.  CT abdomen with widespread metastatic disease to liver.  Due to her worsening pain it was thought to be due to SBP and she was started on Zosyn.  Urine culture grew 10,000 colonies of Klebsiella with ESBL positive.          Assessment & Plan:  Abdominal pain due to bulky liver metastasis Moderate malignant ascites SBP and sepsis are doubted. Paracentesis was attempted for diagnostic purposes early in hospital stay, but not enough fluid, and platelets too low to attempt to safely.    Urine culture growing Klebsiella oxytocin, ESBL.  ID consulted, suspect this is cystitis.  -Continue ciprofloxacin day 2 of 3 -Consult ID, appreciate expert guidance  -Follow blood culture  -Continue decadron -Attn for PJP ppx if continued steroids at this dose  -Discussed imaging portal vein with Radiology, who recommend CT with contrast rather than MRA.  She would need pre-treatment with steroids/diphenhydramine to do this.  However, get the lack of therapeutic options if we were to discover mechanical or thrombotic obstruction of her portal vein, I will defer this for now  -Consult  Palliative Care, Oncology    Acute hypoxic respiratory failure This developed after admission.  CTA chest obtained, showed no PE, minimal ground glass opacities.    BNP normal and opacities peripheral, doubt edema.  ?atelectasis, but her O2 requirement seems out of proportion to simple atelectasis (she desats to 70s off O2).  Carcinomatosis is considered but doubted, likewise COVID and atypical bacterial pneumonia. -Incentive spirometry and wean O2 as able -Repeat CXR tomorrow  Metastatic breast cancer, triple negative, stage IV -Consult oncology, the plan chemotherapy today  Anemia of chronic disease Thrombocytopenia related to chemo Platelets slightly down 26K today -Trend CBC  Hypertension BP controlled -Continue losartan  Anxiety -Continue Xanax as needed         MDM and disposition: The below labs and imaging reports reviewed and summarized above.  Medication management as above.    The patient was admitted with malignant acites, uncontrolled pain, and possible sepsis from SBP.  She now has worsening thrombocytopenia, as well as chemotherapy, which is a high risk situation, requires in-hospital monitoring, furthermore she has ongoing oxygen requirement which is new up to 4 L.         DVT prophylaxis: SCDs Code Status: FULL Family Communication:     Consultants:   Oncology  ID  Palliative care  Procedures:   11/29 CT abdomen wo contrast -- bulky tumor burden  11/30 US abdomen -- failed to tap ascites  12/1 CTA chest -- no PE, mild scattered GGOs  Antimicrobials:    Zosyn 11/29 >> 12/1  Flagyl and Cefepime x1 on 11/29  Ceftriaxone x1 on 12/1  Cipro 12/2 >>   Subjective: Abdominal pain is improved.  Still dyspneic with exertion.  No orthopnea, swelling, fever, chest pain, sputum production.        Objective: Vitals:   01/01/19 1935 01/01/19 2048 01/02/19 0044 01/02/19 0831  BP:  (!) 127/94 125/89 (!) 130/91  Pulse:  86 79 87   Resp:  17 18 18   Temp:  97.6 F (36.4 C) 97.8 F (36.6 C) 97.8 F (36.6 C)  TempSrc:  Oral Oral Oral  SpO2: 96% 94% 95% 98%  Weight:      Height:       No intake or output data in the 24 hours ending 01/02/19 1230 Filed Weights   12/30/2018 0855 12/31/18 0500  Weight: 64.9 kg 63.2 kg    Examination: General appearance:  adult female, alert and in mild respiratory distress.   HEENT: Anicteric, conjunctiva pale, lids and lashes normal. No nasal deformity, discharge, epistaxis.  Lips moist, teeth normal. OP moist, no oral lesions.  Urine normal. Skin: Pale.  Warm and dry.  No suspicious rashes or lesions. Cardiac: Tachycardic, regular, no murmurs appreciated.  JVP normal, no LE edema.    Respiratory: Tachypneic, appears dyspneic with exertion.  CTAB without rales or wheezes. Abdomen: Abdomen soft.  Hepatomegaly noted.  Diffuse tenderness to palpation in the right upper quadrant primarily.  Voluntary guarding noted.   MSK: No deformities or effusions of the large joints of the upper or lower extremities bilaterally. Neuro: Awake and alert. Naming is grossly intact, and the patient's recall, recent and remote, as well as general fund of knowledge seem within normal limits.  Muscle tone normal, without fasciculations.  Moves all extremities equally and with normal coordination.  Marland Kitchen Speech fluent.    Psych: Sensorium intact and responding to questions, attention normal. Affect normal.  Judgment and insight appear normal.     Data Reviewed: I have personally reviewed following labs and imaging studies:  CBC: Recent Labs  Lab 12/21/2018 0919 12/10/2018 1729 12/30/18 0530 12/31/18 0514 01/01/19 0528 01/02/19 0534  WBC 12.5*  --  10.5 9.8 13.6* 15.2*  HGB 12.2  --  12.1 12.2 12.6 11.8*  HCT 34.8* 34.8 36.3 34.3* 35.8* 34.3*  MCV 90.6  --  93.6 90.0 90.9 92.2  PLT 40*  --  39* 34* 35* 26*   Basic Metabolic Panel: Recent Labs  Lab 12/16/2018 0919 12/30/18 0530 12/31/18 0514  01/02/19 0534  NA 133* 131* 131* 132*  K 4.2 4.6 4.8 4.8  CL 93* 93* 93* 93*  CO2 25 25 27 29   GLUCOSE 121* 133* 147* 132*  BUN 17 19 17  24*  CREATININE 0.56 0.63 0.58 0.51  CALCIUM 8.7* 8.6* 8.5* 8.7*   GFR: Estimated Creatinine Clearance: 78.8 mL/min (by C-G formula based on SCr of 0.51 mg/dL). Liver Function Tests: Recent Labs  Lab 12/01/2018 0919 12/30/18 0530 12/31/18 0514 01/02/19 0534  AST 133* 147* 151* 179*  ALT 85* 88* 88* 108*  ALKPHOS 118 123 142* 186*  BILITOT 1.1 1.0 1.0 1.0  PROT 5.8* 5.8* 5.5* 5.6*  ALBUMIN 2.7* 2.7* 2.5* 2.7*   Recent Labs  Lab 12/06/2018 0919  LIPASE 20   No results for input(s): AMMONIA in the last 168 hours. Coagulation Profile: Recent Labs  Lab 12/30/18 0530  INR 1.2   Cardiac Enzymes: No results for input(s): CKTOTAL, CKMB, CKMBINDEX, TROPONINI in the last 168 hours. BNP (last 3 results) No results for input(s): PROBNP in the last  8760 hours. HbA1C: No results for input(s): HGBA1C in the last 72 hours. CBG: Recent Labs  Lab 01/01/19 1235 01/01/19 1657 01/01/19 2149 01/02/19 0745 01/02/19 1148  GLUCAP 151* 160* 199* 118* 121*   Lipid Profile: No results for input(s): CHOL, HDL, LDLCALC, TRIG, CHOLHDL, LDLDIRECT in the last 72 hours. Thyroid Function Tests: No results for input(s): TSH, T4TOTAL, FREET4, T3FREE, THYROIDAB in the last 72 hours. Anemia Panel: No results for input(s): VITAMINB12, FOLATE, FERRITIN, TIBC, IRON, RETICCTPCT in the last 72 hours. Urine analysis:    Component Value Date/Time   COLORURINE YELLOW (A) 12/18/2018 0920   APPEARANCEUR CLEAR (A) 12/07/2018 0920   LABSPEC 1.015 12/15/2018 0920   PHURINE 6.0 12/21/2018 0920   GLUCOSEU NEGATIVE 12/14/2018 0920   HGBUR NEGATIVE 12/08/2018 0920   BILIRUBINUR NEGATIVE 12/25/2018 0920   BILIRUBINUR neg 05/16/2016 1537   KETONESUR NEGATIVE 12/24/2018 0920   PROTEINUR NEGATIVE 12/05/2018 0920   UROBILINOGEN 0.2 05/16/2016 1537   NITRITE NEGATIVE  12/08/2018 0920   LEUKOCYTESUR NEGATIVE 12/05/2018 0920   Sepsis Labs: @LABRCNTIP (procalcitonin:4,lacticacidven:4)  ) Recent Results (from the past 240 hour(s))  Urine culture     Status: Abnormal   Collection Time: 12/01/2018  9:20 AM   Specimen: Urine, Clean Catch  Result Value Ref Range Status   Specimen Description   Final    URINE, CLEAN CATCH Performed at [facility], [address], [place], [place] [address]    Special Requests   Final    NONE Performed at [facility], [address] [place], [place] [address]    Culture (A)  Final    10,000 COLONIES/mL KLEBSIELLA OXYTOCA Confirmed Extended Spectrum Beta-Lactamase Producer (ESBL).  In bloodstream infections from ESBL organisms, carbapenems are preferred over piperacillin/tazobactam. They are shown to have a lower risk of mortality.    Report Status 12/31/2018 FINAL  Final   Organism ID, Bacteria KLEBSIELLA OXYTOCA (A)  Final      Susceptibility   Klebsiella oxytoca - MIC*    AMPICILLIN >=32 RESISTANT Resistant     CEFAZOLIN >=64 RESISTANT Resistant     CEFTRIAXONE 8 SENSITIVE Sensitive     CIPROFLOXACIN <=0.25 SENSITIVE Sensitive     GENTAMICIN <=1 SENSITIVE Sensitive     IMIPENEM <=0.25 SENSITIVE Sensitive     NITROFURANTOIN 32 SENSITIVE Sensitive     TRIMETH/SULFA <=20 SENSITIVE Sensitive     AMPICILLIN/SULBACTAM >=32 RESISTANT Resistant     PIP/TAZO >=128 RESISTANT Resistant     Extended ESBL POSITIVE Resistant     * 10,000 COLONIES/mL KLEBSIELLA OXYTOCA  Blood culture (routine x 2)     Status: None (Preliminary result)   Collection Time: 12/15/2018 11:13 AM   Specimen: BLOOD  Result Value Ref Range Status   Specimen Description BLOOD RIGHT ANTECUBITAL  Final   Special Requests   Final    BOTTLES DRAWN AEROBIC AND ANAEROBIC Blood Culture adequate volume   Culture   Final    NO GROWTH 4 DAYS Performed at [facility], [address] [place], [place] [address]    Report Status  PENDING  Incomplete  Blood culture (routine x 2)     Status: None (Preliminary result)   Collection Time: 12/06/2018 11:13 AM   Specimen: BLOOD  Result Value Ref Range Status   Specimen Description BLOOD LEFT ANTECUBITAL  Final   Special Requests   Final    BOTTLES DRAWN AEROBIC AND ANAEROBIC Blood Culture adequate volume   Culture   Final    NO  GROWTH 4 DAYS Performed at [facility], [address] [place], [place] [address]    Report Status PENDING  Incomplete  SARS CORONAVIRUS 2 (TAT 6-24 HRS) Nasopharyngeal Nasopharyngeal Swab     Status: None   Collection Time: 12/05/2018  1:19 PM   Specimen: Nasopharyngeal Swab  Result Value Ref Range Status   SARS Coronavirus 2 NEGATIVE NEGATIVE Final    Comment: (NOTE) SARS-CoV-2 target nucleic acids are NOT DETECTED. The SARS-CoV-2 RNA is generally detectable in upper and lower respiratory specimens during the acute phase of infection. Negative results do not preclude SARS-CoV-2 infection, do not rule out co-infections with other pathogens, and should not be used as the sole basis for treatment or other patient management decisions. Negative results must be combined with clinical observations, patient history, and epidemiological information. The expected result is Negative. Fact Sheet for Patients: SugarRoll.be Fact Sheet for Healthcare Providers: https://www.woods-mathews.com/ This test is not yet approved or cleared by the [place] FDA and  has been authorized for detection and/or diagnosis of SARS-CoV-2 by FDA under an Emergency Use Authorization (EUA). This EUA will remain  in effect (meaning this test can be used) for the duration of the COVID-19 declaration under Section 56 4(b)(1) of the Act, 21 U.S.C. section 360bbb-3(b)(1), unless the authorization is terminated or revoked sooner. Performed at [facility], [place] [address], [place], [place] [address]           Radiology Studies: Ct Angio Chest Pe W Or Wo Contrast  Result Date: 12/31/2018 CLINICAL DATA:  Hypoxemia EXAM: CT ANGIOGRAPHY CHEST WITH CONTRAST TECHNIQUE: Multidetector CT imaging of the chest was performed using the standard protocol during bolus administration of intravenous contrast. Multiplanar CT image reconstructions and MIPs were obtained to evaluate the vascular anatomy. CONTRAST:  52mL OMNIPAQUE IOHEXOL 350 MG/ML SOLN COMPARISON:  None. FINDINGS: Cardiovascular: No filling defects in the pulmonary arteries to suggest pulmonary emboli. Heart is normal size. Aorta is normal caliber. Small pericardial effusion. Mediastinum/Nodes: No mediastinal, hilar, or axillary adenopathy. Trachea and esophagus are unremarkable. Thyroid unremarkable. Lungs/Pleura: Trace left effusion and small right effusion. Bilateral lower lobe airspace opacities are noted, right greater than left which could reflect atelectasis or pneumonia. There are scattered predominantly peripheral ground-glass opacities in the upper lobes which could reflect atypical/viral infection. Upper Abdomen: While not imaged in its entirety, the liver appears prominent. Recommend clinical correlation for possible hepatomegaly. Small amount of ascites surrounding the liver and spleen. Musculoskeletal: No acute bony abnormality. Chest wall soft tissues are unremarkable. Right chest wall Port-A-Cath in place with the tip in the SVC. Review of the MIP images confirms the above findings. IMPRESSION: No evidence of pulmonary embolus. Small right effusion and trace left effusion. Small pericardial effusion. Bilateral lower lobe atelectasis or infiltrate/pneumonia. Patchy peripheral ground-glass opacities in the upper lobes could reflect atypical/viral pneumonia. COVID pneumonia cannot be excluded. Liver appears prominent, but is not imaged in its entirety. Recommend clinical correlation for possible hepatomegaly. Perihepatic and perisplenic ascites.  Electronically Signed   By: Rolm Baptise M.D.   On: 12/31/2018 19:30        Scheduled Meds:  Chlorhexidine Gluconate Cloth  6 each Topical Daily   cholecalciferol  2,000 Units Oral QPM   ciprofloxacin  500 mg Oral BID   dexamethasone  8 mg Oral Q8H   feeding supplement (ENSURE ENLIVE)  237 mL Oral BID BM   fentaNYL  1 patch Transdermal Q72H   insulin aspart  0-9 Units Subcutaneous TID  WC   losartan  50 mg Oral Daily   multivitamin with minerals  1 tablet Oral Daily   omega-3 acid ethyl esters  1,000 mg Oral QPM   pantoprazole  40 mg Oral Daily   polyethylene glycol  17 g Oral Daily   sodium chloride flush  10 mL Intravenous q morning - 10a   sucralfate  1 g Oral TID WC & HS   Continuous Infusions:  sodium chloride 250 mL (01/02/19 1051)     LOS: 4 days    Time spent: 25 minutes    Edwin Dada, MD [facility] 01/02/2019, 12:30 PM     Please page though [place] or Epic secure chat:  For [employer], Adult nurse

## 2019-01-02 NOTE — Progress Notes (Signed)
Dr Grayland Ormond aware that platelets 26

## 2019-01-02 NOTE — Progress Notes (Signed)
Dr Grayland Ormond acknowledged platelet count, ok to give chemo today

## 2019-01-02 NOTE — Progress Notes (Signed)
Date of Admission:  12/25/2018     ID: Shelby [name] is a 48 y.o. female  Principal Problem:   Abdominal pain Active Problems:   Hypertension   Stage IV breast cancer in female ([place])   Pancytopenia ([place])   Transaminitis   Liver mass   GERD (gastroesophageal reflux disease)   Sepsis (H[facility])   Ascites, malignant    Subjective: Got up from bed to go to the bathroom and felt very SOB Says she is feeling panicky Nurse at bed side calming her satrted chemo today  Medications:  . Chlorhexidine Gluconate Cloth  6 each Topical Daily  . cholecalciferol  2,000 Units Oral QPM  . ciprofloxacin  500 mg Oral BID  . dexamethasone  8 mg Oral Q8H  . feeding supplement (ENSURE ENLIVE)  237 mL Oral BID BM  . fentaNYL  1 patch Transdermal Q72H  . insulin aspart  0-9 Units Subcutaneous TID WC  . losartan  50 mg Oral Daily  . multivitamin with minerals  1 tablet Oral Daily  . omega-3 acid ethyl esters  1,000 mg Oral QPM  . pantoprazole  40 mg Oral Daily  . polyethylene glycol  17 g Oral Daily  . sodium chloride flush  10 mL Intravenous q morning - 10a  . sucralfate  1 g Oral TID WC & HS    Objective: Vital signs in last 24 hours: Temp:  [97.5 F (36.4 C)-97.8 F (36.6 C)] 97.5 F (36.4 C) (12/03 1623) Pulse Rate:  [79-87] 86 (12/03 1623) Resp:  [17-18] 18 (12/03 1623) BP: (125-130)/(89-94) 125/90 (12/03 1623) SpO2:  [63 %-98 %] 94 % (12/03 1623)  PHYSICAL EXAM:  General: pale in resp distress- breathing through her mouth Extremities: edema lower extremity Neurologic: Grossly non-focal  Lab Results Recent Labs    12/31/18 0514 01/01/19 0528 01/02/19 0534  WBC 9.8 13.6* 15.2*  HGB 12.2 12.6 11.8*  HCT 34.3* 35.8* 34.3*  NA 131*  --  132*  K 4.8  --  4.8  CL 93*  --  93*  CO2 27  --  29  BUN 17  --  24*  CREATININE 0.58  --  0.51   Liver Panel Recent Labs    12/31/18 0514 01/02/19 0534  PROT 5.5* 5.6*  ALBUMIN 2.5* 2.7*  AST 151* 179*  ALT 88* 108*   ALKPHOS 142* 186*  BILITOT 1.0 1.0   Sedimentation Rate No results for input(s): ESRSEDRATE in the last 72 hours. C-Reactive Protein No results for input(s): CRP in the last 72 hours.  Microbiology:  Studies/Results: Ct Angio Chest Pe W Or Wo Contrast  Result Date: 12/31/2018 CLINICAL DATA:  Hypoxemia EXAM: CT ANGIOGRAPHY CHEST WITH CONTRAST TECHNIQUE: Multidetector CT imaging of the chest was performed using the standard protocol during bolus administration of intravenous contrast. Multiplanar CT image reconstructions and MIPs were obtained to evaluate the vascular anatomy. CONTRAST:  22mL OMNIPAQUE IOHEXOL 350 MG/ML SOLN COMPARISON:  None. FINDINGS: Cardiovascular: No filling defects in the pulmonary arteries to suggest pulmonary emboli. Heart is normal size. Aorta is normal caliber. Small pericardial effusion. Mediastinum/Nodes: No mediastinal, hilar, or axillary adenopathy. Trachea and esophagus are unremarkable. Thyroid unremarkable. Lungs/Pleura: Trace left effusion and small right effusion. Bilateral lower lobe airspace opacities are noted, right greater than left which could reflect atelectasis or pneumonia. There are scattered predominantly peripheral ground-glass opacities in the upper lobes which could reflect atypical/viral infection. Upper Abdomen: While not imaged in its entirety, the liver appears prominent. Recommend  clinical correlation for possible hepatomegaly. Small amount of ascites surrounding the liver and spleen. Musculoskeletal: No acute bony abnormality. Chest wall soft tissues are unremarkable. Right chest wall Port-A-Cath in place with the tip in the SVC. Review of the MIP images confirms the above findings. IMPRESSION: No evidence of pulmonary embolus. Small right effusion and trace left effusion. Small pericardial effusion. Bilateral lower lobe atelectasis or infiltrate/pneumonia. Patchy peripheral ground-glass opacities in the upper lobes could reflect atypical/viral  pneumonia. COVID pneumonia cannot be excluded. Liver appears prominent, but is not imaged in its entirety. Recommend clinical correlation for possible hepatomegaly. Perihepatic and perisplenic ascites. Electronically Signed   By: Rolm Baptise M.D.   On: 12/31/2018 19:30     Assessment/Plan: ESBL klebsiella in the urine- only 10K colonies- less likely to be a full blown infection- but she has  Symptoms of pressure and some pain on micturition , bladder wall thickening in CT and needing chemo and on steroids will treat as cystitis with 3 days of cipro. Avoid bactrim due to thrombocytopenia  Extensive liver mets with portal vein involvement- she is presenting like BUDD chiari with pain, acutely enlarged liver and ascitis and also collaterals Doubt SBP  Thrombocytopenia due to the extensive malignacy look for PHT and splenomegaly  Acute hypoxic resp failure- No PE, has b/l lower lobe atelectasis- incentive spirometry ?? lymphangitis carcinmatosis ?? hepatopulmonary syndrome causing  Intrapulmonary vascular dilatation and shunting   Stage IV Triple negative breast cancer with liver mets. Started [place] today  Fever- has resolved with decadron - thought to be due to tumor buren- if she continue on high dose steroids will need PCP prophylaxis  Discussed the management with her nurse

## 2019-01-02 NOTE — Progress Notes (Signed)
[place]  [phone] [phone]  ID: Shelby [name] OB: 1970-08-03  MR#: [id number]  [id number]  Patient Care Team: Jerrol Banana., MD as PCP - General (Family Medicine) [name], MD as Consulting Physician (Cardiology) Alfonzo Feller, RN as [place]  CHIEF COMPLAINT: Stage IV breast cancer with liver metastasis.  INTERVAL HISTORY: Patient with significant shortness of breath and desaturation into the 60s with ambulation.  Abdominal pain has improved.  She continues to have significant anxiety.  REVIEW OF SYSTEMS:   Review of Systems  Constitutional: Positive for malaise/fatigue. Negative for fever and weight loss.  Respiratory: Positive for shortness of breath. Negative for cough.   Cardiovascular: Positive for leg swelling. Negative for chest pain.  Gastrointestinal: Positive for abdominal pain.  Genitourinary: Negative.  Negative for dysuria.  Musculoskeletal: Negative.  Negative for back pain.  Skin: Negative.  Negative for rash.  Neurological: Positive for weakness. Negative for dizziness, focal weakness and headaches.  Endo/Heme/Allergies: Does not bruise/bleed easily.  Psychiatric/Behavioral: The patient is nervous/anxious.     As per HPI. Otherwise, a complete review of systems is negative.  PAST MEDICAL HISTORY: Past Medical History:  Diagnosis Date   Anemia    h/o with pregnancy   Anxiety    Asthma    allergy induced-no inhaler   Cancer ([place])    breast left    Complication of anesthesia    Environmental allergies    Family history of adverse reaction to anesthesia    son-breathing problems-coded 1st time when he was 2 and had another surgery at 83 and had to be admitted for breathing problems   Family history of breast cancer    GERD (gastroesophageal reflux disease)    Headache    migraines   Hypertension    Mitral valve disease    Mitral  valve prolapse    Painful menstrual periods    PONV (postoperative nausea and vomiting)    Vertigo     PAST SURGICAL HISTORY: Past Surgical History:  Procedure Laterality Date   ABDOMINAL HYSTERECTOMY  2010   supracervical    AXILLARY LYMPH NODE DISSECTION Left 07/12/2018   Procedure: AXILLARY LYMPH NODE DISSECTION;  Surgeon: Robert Bellow, MD;  Location: [facility];  Service: General;  Laterality: Left;   BREAST BIOPSY Left 11/2017   invasive ductal carcinoma and metastatic LN   BREAST BIOPSY WITH SENTINEL LYMPH NODE BIOPSY AND NEEDLE LOCALIZATION Left 07/12/2018   Procedure: BREAST BIOPSY WIDE EXCISION WITH SENTINEL NODE  AND NEEDLE LOCALIZATION OF [name] LEFT;  Surgeon: Robert Bellow, MD;  Location: [facility];  Service: General;  Laterality: Left;   BUNIONECTOMY Bilateral    MANDIBLE RECONSTRUCTION     age 57-underbite   PORTACATH PLACEMENT Right 12/31/2017   Procedure: INSERTION PORT-A-CATH;  Surgeon: Robert Bellow, MD;  Location: [facility];  Service: General;  Laterality: Right;   RE-EXCISION OF BREAST LUMPECTOMY Left 07/24/2018   Procedure: RE-EXCISION OF BREAST LUMPECTOMY LEFT;  Surgeon: Robert Bellow, MD;  Location: [facility];  Service: General;  Laterality: Left;   SHOULDER ARTHROSCOPY WITH OPEN ROTATOR CUFF REPAIR Right 04/26/2017   Procedure: SHOULDER ARTHROSCOPY WITH OPEN ROTATOR CUFF REPAIR,SUBACROMINAL DECOMPRESSION;  Surgeon: Thornton Park, MD;  Location: [facility];  Service: Orthopedics;  Laterality: Right;   TONSILLECTOMY      FAMILY HISTORY: Family History  Problem Relation Age of Onset   Breast cancer Mother 59   Diabetes Father  Cancer Maternal Uncle        colon   Breast cancer Maternal Grandmother 6    ADVANCED DIRECTIVES (Y/N):  @ADVDIR @  HEALTH MAINTENANCE: Social History   Tobacco Use   Smoking status: Former Smoker    Packs/day: 0.50    Years: 10.00    Pack years: 5.00    Types: Cigarettes    Quit  date: 04/20/2007    Years since quitting: 11.7   Smokeless tobacco: Never Used   Tobacco comment: social smoker back then  Substance Use Topics   Alcohol use: Yes    Comment: occas   Drug use: No     Colonoscopy:  PAP:  Bone density:  Lipid panel:  Allergies  Allergen Reactions   Hydrocodone Hives    Swollen face - many years ago. Thinks she has tolerated since.   Iodine Hives   Peanut Butter Flavor Other (See Comments)    Made mouth tingle   Shellfish Allergy Hives   Vancomycin Other (See Comments)    Itching and forehead turned red    Current Facility-Administered Medications  Medication Dose Route Frequency Provider Last Rate Last Dose   0.9 %  sodium chloride infusion   Intravenous PRN Amin, Jeanella Flattery, MD   Stopped at 01/02/19 1128   ALPRAZolam (XANAX) tablet 0.25-0.5 mg  0.25-0.5 mg Oral BID PRN Amin, Ankit Chirag, MD   0.5 mg at 01/02/19 1045   bisacodyl (DULCOLAX) EC tablet 5 mg  5 mg Oral Daily PRN Damita Lack, MD   5 mg at 12/31/18 0858   Chlorhexidine Gluconate Cloth 2 % PADS 6 each  6 each Topical Daily Damita Lack, MD   6 each at 01/02/19 0757   cholecalciferol (VITAMIN D) tablet 2,000 Units  2,000 Units Oral QPM Amin, Ankit Chirag, MD   2,000 Units at 01/02/19 1733   ciprofloxacin (CIPRO) tablet 500 mg  500 mg Oral BID Tsosie Billing, MD   500 mg at 01/02/19 0824   dexamethasone (DECADRON) tablet 8 mg  8 mg Oral Q8H Amin, Ankit Chirag, MD   8 mg at 01/02/19 1309   famotidine (PEPCID) tablet 20 mg  20 mg Oral Daily PRN Amin, Ankit Chirag, MD   20 mg at 12/31/18 1158   feeding supplement (ENSURE ENLIVE) (ENSURE ENLIVE) liquid 237 mL  237 mL Oral BID BM Amin, Ankit Chirag, MD   237 mL at 12/31/18 1158   fentaNYL (DURAGESIC) 25 MCG/HR 1 patch  1 patch Transdermal Q72H Lloyd Huger, MD   1 patch at 01/01/19 1254   fentaNYL (SUBLIMAZE) injection 25 mcg  25 mcg Intravenous Q4H PRN Amin, Ankit Chirag, MD        HYDROmorphone (DILAUDID) injection 1 mg  1 mg Intravenous Q1H PRN [name] , MD   1 mg at 12/06/2018 1600   HYDROmorphone (DILAUDID) tablet 1 mg  1 mg Oral Q4H PRN Amin, Ankit Chirag, MD       insulin aspart (novoLOG) injection 0-9 Units  0-9 Units Subcutaneous TID WC Amin, Ankit Chirag, MD   1 Units at 01/02/19 1733   losartan (COZAAR) tablet 50 mg  50 mg Oral Daily Amin, Ankit Chirag, MD   50 mg at 01/02/19 0756   multivitamin with minerals tablet 1 tablet  1 tablet Oral Daily Amin, Ankit Chirag, MD   1 tablet at 01/02/19 0756   omega-3 acid ethyl esters (LOVAZA) capsule 1,000 mg  1,000 mg Oral QPM Amin, Jeanella Flattery, MD  1,000 mg at 01/02/19 1733   ondansetron (ZOFRAN) tablet 4 mg  4 mg Oral Q6H PRN Damita Lack, MD   4 mg at 01/01/19 0539   Or   ondansetron (ZOFRAN) injection 4 mg  4 mg Intravenous Q6H PRN Amin, Ankit Chirag, MD       pantoprazole (PROTONIX) EC tablet 40 mg  40 mg Oral Daily Amin, Ankit Chirag, MD   40 mg at 01/02/19 0757   polyethylene glycol (MIRALAX / GLYCOLAX) packet 17 g  17 g Oral Daily Lorella Nimrod, MD   17 g at 12/31/18 0845   simethicone (MYLICON) chewable tablet 80 mg  80 mg Oral QID PRN Gerald Dexter, RPH   80 mg at 01/01/19 1739   sodium chloride flush (NS) 0.9 % injection 10 mL  10 mL Intravenous q morning - 10a Amin, Ankit Chirag, MD   10 mL at 01/02/19 0806   sodium chloride flush (NS) 0.9 % injection 10 mL  10 mL Intravenous PRN Amin, Ankit Chirag, MD   10 mL at 12/23/2018 1735   sucralfate (CARAFATE) tablet 1 g  1 g Oral TID WC & HS Amin, Ankit Chirag, MD   1 g at 01/02/19 1733   traMADol (ULTRAM) tablet 50 mg  50 mg Oral Q12H PRN Amin, Ankit Chirag, MD        OBJECTIVE: Vitals:   01/02/19 1507 01/02/19 1623  BP:  125/90  Pulse:  86  Resp:  18  Temp:  (!) 97.5 F (36.4 C)  SpO2: 95% 94%     Body mass index is 22.49 kg/m.    ECOG FS:3 - Symptomatic, >50% confined to bed  General: Well-developed, well-nourished, no acute  distress. Eyes: Pink conjunctiva, anicteric sclera. HEENT: Normocephalic, moist mucous membranes. Lungs: Clear to auscultation bilaterally. Heart: Regular rate and rhythm. No rubs, murmurs, or gallops. Abdomen: Hepatomegaly, nontender.   Musculoskeletal: No edema, cyanosis, or clubbing. Neuro: Alert, answering all questions appropriately. Cranial nerves grossly intact. Skin: No rashes or petechiae noted. Psych: Normal affect.  LAB RESULTS:  Lab Results  Component Value Date   NA 132 (L) 01/02/2019   K 4.8 01/02/2019   CL 93 (L) 01/02/2019   CO2 29 01/02/2019   GLUCOSE 132 (H) 01/02/2019   BUN 24 (H) 01/02/2019   CREATININE 0.51 01/02/2019   CALCIUM 8.7 (L) 01/02/2019   PROT 5.6 (L) 01/02/2019   ALBUMIN 2.7 (L) 01/02/2019   AST 179 (H) 01/02/2019   ALT 108 (H) 01/02/2019   ALKPHOS 186 (H) 01/02/2019   BILITOT 1.0 01/02/2019   GFRNONAA >60 01/02/2019   GFRAA >60 01/02/2019    Lab Results  Component Value Date   WBC 15.2 (H) 01/02/2019   NEUTROABS 3.0 12/23/2018   HGB 11.8 (L) 01/02/2019   HCT 34.3 (L) 01/02/2019   MCV 92.2 01/02/2019   PLT 26 (LL) 01/02/2019     STUDIES: Ct Abdomen Pelvis Wo Contrast  Addendum Date: 12/19/2018   ADDENDUM REPORT: 12/26/2018 10:16 ADDENDUM: Urinary bladder wall mildly thickened. A degree of cystitis questioned. Electronically Signed   By: Lowella Grip III M.D.   On: 12/26/2018 10:16   Result Date: 12/05/2018 CLINICAL DATA:  Abdominal pain with nausea and distention. Breast carcinoma with known liver metastases EXAM: CT ABDOMEN AND PELVIS WITHOUT CONTRAST TECHNIQUE: Multidetector CT imaging of the abdomen and pelvis was performed following the standard protocol without oral or IV contrast. COMPARISON:  December 21, 2018 FINDINGS: Lower chest: There is a right pleural effusion  with atelectatic change in the right base. There is atelectatic change in the left base with rather minimal left pleural effusion. Small pericardial effusion  noted. Hepatobiliary: There is widespread metastatic disease is seen throughout the liver, better delineated on recent contrast-enhanced study. Gallbladder is largely collapsed. No biliary duct dilatation evident. Pancreas: No pancreatic mass or inflammatory focus. Spleen: No splenic lesions are evident. Adrenals/Urinary Tract: Adrenals appear unremarkable bilaterally. Kidneys bilaterally show no evident mass or hydronephrosis on either side. There is no evident renal or ureteral calculus on either side. Urinary bladder is midline. The urinary bladder wall thickness is mildly increased. Stomach/Bowel: There is moderate stool in the colon. There is no appreciable bowel wall or mesenteric thickening. No evident bowel obstruction. Terminal ileal region appears unremarkable. There is no free air or portal venous air. Vascular/Lymphatic: No abdominal aortic aneurysm. No arterial lesions seen on this noncontrast enhanced study. Multiple upper abdominal venous collaterals are again noted without change from recent study. A focal periceliac lymph node measuring 1.6 x 1.3 cm is stable. No new adenopathy evident compared to recent study. Reproductive: Uterus absent.  No pelvic mass evident. Other: There is moderate ascites, slightly increased compared to recent study. No abscess evident in the abdomen or pelvis. No periappendiceal region inflammation. Appendix not appreciable. Musculoskeletal: No lytic or destructive bone lesions are evident. A small sclerotic focus in the right sacrum may represent a small bone island. No intramuscular lesions are evident. IMPRESSION: 1.  Moderate ascites, slightly increased compared to recent study. 2.  Widespread hepatic metastatic disease. 3. No bowel obstruction. No abscess in the abdomen or pelvis. No periappendiceal region inflammation. 4.  Enlarged periceliac lymph node, suspected due to neoplasm. 5. Venous collaterals in the upper abdomen, primarily on the left, stable. Further  assessment with respect to etiology not possible without intravenous contrast. Appearance stable compared to recent contrast enhanced CT examination. 6.  Uterus absent. 7. Small pleural effusions with bibasilar atelectatic change. Small pericardial effusion. Electronically Signed: By: Lowella Grip III M.D. On: 12/27/2018 10:13   Ct Abdomen Pelvis Wo Contrast  Addendum Date: 12/18/2018   ADDENDUM REPORT: 12/18/2018 20:18 ADDENDUM: These results were called by telephone on 12/18/2018 at 8:18 pm to provider [facility] , who verbally acknowledged these results. Electronically Signed   By: Lovena Le M.D.   On: 12/18/2018 20:18   Result Date: 12/18/2018 CLINICAL DATA:  Abdominal pain and distension, undergoing chemotherapy for breast cancer, history of IBS and diverticulitis EXAM: CT ABDOMEN AND PELVIS WITHOUT CONTRAST TECHNIQUE: Multidetector CT imaging of the abdomen and pelvis was performed following the standard protocol without IV contrast. COMPARISON:  CT abdomen pelvis 01/01/2018 FINDINGS: Lower chest: The patchy areas of subpleural ground-glass opacity could reflect atelectasis or early infection. Cardiac size is normal. Trace pericardial effusion, similar to prior. Hepatobiliary: Ill-defined regions of hypoattenuation are seen the anterior left lobe liver. Slightly nodular hepatic surface contour which is new from comparison exam. These features are both incompletely assessed in the absence of contrast media. Gallbladder appears largely contracted at the time of exam. No biliary ductal dilatation. Pancreas: Unremarkable. No pancreatic ductal dilatation or surrounding inflammatory changes. Spleen: Normal in size without focal abnormality. Adrenals/Urinary Tract: No suspicious adrenal lesions. No visible or contour deforming renal lesions. No urolithiasis or hydronephrosis mild circumferential bladder wall thickening. Stomach/Bowel: Distal esophagus, stomach and duodenal sweep are unremarkable. No  small bowel wall thickening or dilatation. No evidence of obstruction. Appendix is not clearly identified. No pericecal inflammation. There is  some mild mural thickening of the splenic flexure and descending colon remaining portions of the colon are free of mural thickening or dilatation. Vascular/Lymphatic: Upper abdominal venous collateralization and splenorenal shunting is noted. No suspicious or enlarged lymph nodes in the included lymphatic chains. Reproductive: Uterus is surgically absent. No concerning adnexal lesions. Other: Small volume of low-attenuation ascites noted in the low abdomen. Small volume of perihepatic low-attenuation ascites is present as well. Musculoskeletal: Multilevel degenerative changes are present in the imaged portions of the spine. No acute osseous abnormality or suspicious osseous lesion. IMPRESSION: 1. Interval development of cirrhotic stigmata including a nodular liver surface contour and increasing upper abdominal venous collateralization. Small volume of ascites is noted as well. 2. Ill-defined regions of hypoattenuation are seen within the anterior left lobe liver, which are incompletely assessed in the absence of contrast media. Furthermore findings are worrisome in the setting of known malignancy and potential intrinsic liver disease. Consider further evaluation with MRI or ultrasound if patient is unable to tolerate CT contrast due to allergy. 3. Mild mural thickening of the splenic flexure and descending colon, could reflect change related to the additional cirrhotic findings/portal colopathy though should exclude a an acute colitis clinically. 4. Patchy areas of subpleural ground-glass opacity in the visualized lung bases could reflect atelectasis or early infection. 5. Mild circumferential bladder wall thickening, which may be related to underdistention. Correlate with urinalysis to exclude cystitis. 6. Unchanged trace pericardial effusion. Electronically Signed: By:  Lovena Le M.D. On: 12/18/2018 20:07   Dg Chest 1 View  Result Date: 12/23/2018 CLINICAL DATA:  Cirrhosis, ascites, SIRS, history LEFT breast cancer, hypertension EXAM: CHEST  1 VIEW COMPARISON:  Portable exam 1016 hours compared to 12/21/2018 FINDINGS: RIGHT subclavian Port-A-Cath with tip projecting over superior RIGHT atrium, unchanged. Normal heart size, mediastinal contours, and pulmonary vascularity. Bibasilar atelectasis. Remaining lungs clear. No pleural effusion or pneumothorax. IMPRESSION: Bibasilar atelectasis. Electronically Signed   By: Lavonia Dana M.D.   On: 12/23/2018 10:58   Ct Angio Chest Pe W Or Wo Contrast  Result Date: 12/31/2018 CLINICAL DATA:  Hypoxemia EXAM: CT ANGIOGRAPHY CHEST WITH CONTRAST TECHNIQUE: Multidetector CT imaging of the chest was performed using the standard protocol during bolus administration of intravenous contrast. Multiplanar CT image reconstructions and MIPs were obtained to evaluate the vascular anatomy. CONTRAST:  23mL OMNIPAQUE IOHEXOL 350 MG/ML SOLN COMPARISON:  None. FINDINGS: Cardiovascular: No filling defects in the pulmonary arteries to suggest pulmonary emboli. Heart is normal size. Aorta is normal caliber. Small pericardial effusion. Mediastinum/Nodes: No mediastinal, hilar, or axillary adenopathy. Trachea and esophagus are unremarkable. Thyroid unremarkable. Lungs/Pleura: Trace left effusion and small right effusion. Bilateral lower lobe airspace opacities are noted, right greater than left which could reflect atelectasis or pneumonia. There are scattered predominantly peripheral ground-glass opacities in the upper lobes which could reflect atypical/viral infection. Upper Abdomen: While not imaged in its entirety, the liver appears prominent. Recommend clinical correlation for possible hepatomegaly. Small amount of ascites surrounding the liver and spleen. Musculoskeletal: No acute bony abnormality. Chest wall soft tissues are unremarkable. Right  chest wall Port-A-Cath in place with the tip in the SVC. Review of the MIP images confirms the above findings. IMPRESSION: No evidence of pulmonary embolus. Small right effusion and trace left effusion. Small pericardial effusion. Bilateral lower lobe atelectasis or infiltrate/pneumonia. Patchy peripheral ground-glass opacities in the upper lobes could reflect atypical/viral pneumonia. COVID pneumonia cannot be excluded. Liver appears prominent, but is not imaged in its entirety. Recommend clinical correlation for  possible hepatomegaly. Perihepatic and perisplenic ascites. Electronically Signed   By: Rolm Baptise M.D.   On: 12/31/2018 19:30   Ct Angio Chest Pe W And/or Wo Contrast  Result Date: 12/21/2018 CLINICAL DATA:  48 y.o. female discharged yesterday for concerns of abdominal pain with a work-up regarding her liver as well as she is meeting SIRS criteria with feverPatient comes back today reports she continues to have significant pain and discomfort. She has been having ongoing and severe pain located in the mid to right upper abdomen, also she reports fairly significant pain when she takes a deep breath over the right lower lungShe has been feeling warm had a fever when she was in the hospital yesterday. Some nausea. No headaches. Tested negative for Covid a few days ago pain is located primarily in the right upper abdomen under the right rib cage. EXAM: CT ANGIOGRAPHY CHEST CT ABDOMEN AND PELVIS WITH CONTRAST TECHNIQUE: Multidetector CT imaging of the chest was performed using the standard protocol during bolus administration of intravenous contrast. Multiplanar CT image reconstructions and MIPs were obtained to evaluate the vascular anatomy. Multidetector CT imaging of the abdomen and pelvis was performed using the standard protocol during bolus administration of intravenous contrast. CONTRAST:  4mL OMNIPAQUE IOHEXOL 350 MG/ML SOLN COMPARISON:  Chest CTA and abdomen and pelvis CT dated 12/19/2018.  FINDINGS: CTA CHEST FINDINGS Cardiovascular: There is satisfactory opacification of the pulmonary arteries to the segmental level. No evidence of a pulmonary embolism. Mediastinum/Nodes: No enlarged mediastinal, hilar, or axillary lymph nodes. Thyroid gland, trachea, and esophagus demonstrate no significant findings. Lungs/Pleura: Small bilateral pleural effusions. There is dependent opacity in the lower lobes consistent with atelectasis. Small areas of ground-glass opacity and small lung nodules noted. Dominant nodule in the right upper lobe, image 46, series 4, 6 mm. Several other small nodules are new from the prior CT. Area of ground-glass opacity adjacent to the oblique fissure in the left upper lobe is new. No pneumothorax. Musculoskeletal: No chest wall abnormality. No acute or significant osseous findings. Review of the MIP images confirms the above findings. CT ABDOMEN and PELVIS FINDINGS Hepatobiliary: Enlarged liver. There are multiple hypoattenuating areas throughout the liver. These areas are more apparent and more extensive than on the recent prior CT, particularly evident at the dome of segment 4A, along the anterior aspect of the right lobe and medial segment of the left lobe and along the inferior aspect of the right lobe, segment 5 and 6. Gallbladder is mostly collapsed. There is increased attenuation material within the gallbladder similar to the prior CT. No bile duct dilation Pancreas: Unremarkable. No pancreatic ductal dilatation or surrounding inflammatory changes. Spleen: Normal in size without focal abnormality. Adrenals/Urinary Tract: No adrenal masses. Right kidney displaced inferiorly by the enlarged liver. Symmetric renal enhancement and excretion. No renal masses, stones or hydronephrosis. Normal ureters. Bladder wall is prominent, unchanged from the recent prior study. No bladder masses. Stomach/Bowel: Stomach is unremarkable, mostly decompressed. Small bowel and colon are normal in  caliber. No wall thickening or inflammation. Appendix not discretely seen. No findings of appendicitis. Vascular/Lymphatic: As noted on the prior CT, left portal vein is attenuated, but does show enhancement. No evidence of thrombosis. There are venous collaterals in the upper abdomen. Aorta is unremarkable. No enlarged lymph nodes. Prominent Peri celiac node noted on the prior study is stable. Reproductive: Status post hysterectomy. No adnexal masses. Other: Small amount of ascites increased when compared to the prior CT. Musculoskeletal: No acute or significant osseous  findings. Review of the MIP images confirms the above findings. IMPRESSION: CHEST CTA 1. No evidence of a pulmonary embolism. 2. Small pleural effusions, new from the prior CT. There is increased opacity in the lower lobes that is consistent with atelectasis. There several new small nodules and small areas of ground-glass opacity when compared to the prior CT. Suspect these are inflammatory given change from the recent exam. ABDOMEN AND PELVIS CT 1. Multiple hypoattenuating liver lesions. These appear to have progressed when compared to the prior CT, although this change could be due to differences in contrast timing. The liver is enlarged, stable from the recent exam. Findings are concerning for multifocal hepatocellular carcinoma. Follow-up liver MRI without and with contrast, when the patient can tolerate the procedure, is recommended. 2. Small amount ascites has increased in quantity from the prior study. 3. There are vascular collaterals in the upper abdomen, stable. Electronically Signed   By: Lajean Manes M.D.   On: 12/21/2018 18:36   Ct Angio Chest Pe W And/or Wo Contrast  Result Date: 12/19/2018 CLINICAL DATA:  Chest pain, complex, intermediate/high prob of ACS/PE/AAS; Abd distension Epigastric pain. Exertional dyspnea. Cough. Weakness. History of breast cancer. EXAM: CT ANGIOGRAPHY CHEST CT ABDOMEN AND PELVIS WITH CONTRAST TECHNIQUE:  Multidetector CT imaging of the chest was performed using the standard protocol during bolus administration of intravenous contrast. Multiplanar CT image reconstructions and MIPs were obtained to evaluate the vascular anatomy. Multidetector CT imaging of the abdomen and pelvis was performed using the standard protocol during bolus administration of intravenous contrast. No reported contrast reaction. CONTRAST:  12mL OMNIPAQUE IOHEXOL 350 MG/ML SOLN COMPARISON:  Abdominal CT without contrast yesterday. Chest CT 01/01/2018 FINDINGS: CTA CHEST FINDINGS Cardiovascular: There are no filling defects within the pulmonary arteries to suggest pulmonary embolus. Thoracic aorta is normal in caliber without dissection. Left vertebral artery arises directly from the thoracic aorta, variant arch anatomy. Heart is normal in size. No pericardial effusion. Right chest port in place with tip in the SVC. Mediastinum/Nodes: No mediastinal or hilar adenopathy. No visualized thyroid nodule. No esophageal wall thickening. Lungs/Pleura: Mild patchy areas of subpleural nodular and ground-glass opacities. Basilar findings are unchanged from CT yesterday. 5 mm right upper lobe pulmonary nodule series 6, image 41. Sub solid nodularity slightly more inferiorly in the right lung series 6, image 45 measuring 8 mm. Tiny 2 mm nodule in the right upper lung series 6, image 32, may be fissural. Pulmonary nodules are new from prior chest CT. Perifissural in subpleural opacity in the right upper lobe, also series 6, image 32, nonspecific. No pulmonary edema. No pleural fluid. Trachea and mainstem bronchi are patent. Musculoskeletal: There are no acute or suspicious osseous abnormalities. Review of the MIP images confirms the above findings. CT ABDOMEN and PELVIS FINDINGS Hepatobiliary: Hepatic parenchyma diffusely heterogeneous with nodular contours. Liver is increased in size from December 2019 CT. Gallbladder is decompressed and not well assessed,  intraluminal high density may be stones or sludge. Questionable wall thickening may be related to nondistention. Pancreas: No ductal dilatation or inflammation. Spleen: Normal in size without focal abnormality. Adrenals/Urinary Tract: Normal adrenal glands. No hydronephrosis or perinephric edema. Homogeneous renal enhancement with symmetric excretion on delayed phase imaging. Urinary bladder is partially distended, mild bladder wall thickening. Stomach/Bowel: Lack of enteric contrast and paucity of intra-abdominal fat limits detailed bowel assessment. The stomach is nondistended. Questionable pre-pyloric gastric wall thickening. No small bowel obstruction or inflammatory change. Appendix not discretely visualized, no pericecal inflammation to suggest  appendicitis. Transverse colon is tortuous. Mild colonic wall thickening at the splenic flexure, colon is decompressed, this is not well assessed. Questionable areas of descending colonic wall thickening, for example series 11, image 61. Vascular/Lymphatic: Right portal vein is patent. The left portal vein is attenuated without evidence of thrombus. Tortuous splenic vein with prominent collaterals in the left upper quadrant and left retroperitoneum. 9 mm perigastric lymph node series 11, image 29, nonspecific. No pelvic adenopathy. Reproductive: Uterus surgically absent. Left ovary tentatively visualized and normal. Right ovary not definitively seen. No obvious adnexal mass. Other: Small volume of pelvic ascites measuring simple fluid density, similar to yesterday. Small perihepatic ascites anteriorly. No free air. Musculoskeletal: There are no acute or suspicious osseous abnormalities. Scattered bone islands in the pelvis. Review of the MIP images confirms the above findings. IMPRESSION: CT chest: 1. No pulmonary embolus. 2. Mild patchy areas of subpleural nodular and ground-glass opacities in both lungs. Slightly more confluent subpleural/perifissural right upper lobe  opacity. Findings may be due to atelectasis or infection. 3. Small pulmonary nodules are new from December, infectious/inflammatory or metastatic. CT abdomen/pelvis: 1. Diffusely heterogeneous liver with nodular contours. Liver has increased in size since December 2019 CT. This may be due to underlying cirrhotic change versus diffuse metastatic disease. Recommend further evaluation with abdominal MRI. Left upper quadrant and retroperitoneal collaterals can be seen with portal hypertension. Left portal vein is attenuated, however no discrete thrombus. 2. Questionable pre-pyloric gastric wall thickening, peptic ulcer disease versus gastritis. 3. Bladder wall thickening, small volume abdominopelvic ascites, and areas of colonic wall thickening at the splenic flexure and descending colon are unchanged from CT yesterday. 4. Gallbladder is decompressed and not well assessed, intraluminal high density may be stones or sludge. Questionable gallbladder wall thickening may be related to nondistention or liver disease. This could be further evaluated with MRI or right upper quadrant ultrasound. Electronically Signed   By: Keith Rake M.D.   On: 12/19/2018 05:33   Mr Abdomen W Wo Contrast  Result Date: 12/19/2018 CLINICAL DATA:  Abdominal pain in a patient with breast cancer. EXAM: MRI ABDOMEN WITHOUT AND WITH CONTRAST TECHNIQUE: Multiplanar multisequence MR imaging of the abdomen was performed both before and after the administration of intravenous contrast. CONTRAST:  93mL GADAVIST GADOBUTROL 1 MMOL/ML IV SOLN COMPARISON:  CT of 12/19/2018 FINDINGS: Lower chest: Limited assessment of the lower chest on MRI is unremarkable. Hepatobiliary: Diffuse infiltration of much of the liver, nearly the entire left hepatic lobe, medial section and lateral section in addition to anterior right hepatic lobe with confluent infiltrative restricted diffusion and heterogeneous enhancement measuring approximately 21 by 7.4 cm. Diffuse  metastatic disease is strongly suspected. Boundaries are well-demarcated without adjacent edema in the bordering hepatic parenchyma Discrete hepatic lesions are also scattered about the right hepatic lobe largest in the dome of the right hemi liver measuring approximately 2.5 cm in greatest dimension. These discrete lesions display a targetoid appearance. There are numerous lesions in the right hepatic lobe, lesions are found in all hepatic subsegment. Pancreas: No mass, inflammatory changes, or other parenchymal abnormality identified. Mild ductal distension in the tail of the pancreas may be due to adjacent mass effect. No visible pancreatic lesion is noted. Spleen:  Within normal limits in size and appearance. Adrenals/Urinary Tract: No masses identified. No evidence of hydronephrosis. Stomach/Bowel: Limited assessment of the gastrointestinal tract without signs of acute process. Vascular/Lymphatic: Left-sided peri-renal venous collaterals of uncertain significance perhaps related to elevated portal pressures in the setting of diffuse  hepatic involvement. 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 Right portal vein is patent. Scattered small lymph nodes in the upper abdomen. There is either slow flow or thrombus within the main portal vein at the level of the splenic portal confluence extending in the splenic vein. The SMV is patent. There is mass effect upon the vessel at the level of the splenic portal confluence due to hepatic enlargement. This is best seen on subtraction images, image 54, series 20. Note that slow flow in an otherwise opacified vessel can cause artifact with a similar appearance however, this is seen on contrasted and non contrasted imaging with a similar appearance. Other:  None. Musculoskeletal: No signs of acute or destructive bone process. IMPRESSION: 1. Diffuse infiltration of much of the liver, nearly the entire left hepatic lobe, with confluent infiltrative restricted diffusion and  heterogeneous enhancement measuring approximately 21 by 7.4 cm. Diffuse metastatic disease is strongly suspected with multifocal lesions in the right hepatic lobe. Superimposed infection is difficult to exclude though areas at the boundary of these lesions show no overt signs of edema. 2. Highly narrowed or occluded left portal vein with potential thrombus versus slow flow in the splenoportal confluence. This was patent on the exam of 12/19/2018; however, given above findings a venous phase CT or even ultrasound hepatic Doppler with special attention to the splenic portal confluence may be helpful to exclude this possibility, finding is also exhibited on TrueFISP sequence (image 24, series 10). 3. 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 4. Mild ductal distension in the tail of the pancreas, potentially due to mass effect. Attention on follow-up. 5. Critical Value/emergent results were called by telephone at the time of interpretation on 12/19/2018 at 6:43 pm to [place] , who verbally acknowledged these results. Electronically Signed   By: Zetta Bills M.D.   On: 12/19/2018 18:24   US Abdomen Complete  Result Date: 12/19/2018 CLINICAL DATA:  Abdominal pain EXAM: ABDOMEN ULTRASOUND COMPLETE COMPARISON:  MRI earlier today FINDINGS: Gallbladder: Gallbladder is contracted with associated mild wall thickening measuring up to 4 mm. Sludge noted within the gallbladder. No visible stones. Common bile duct: Diameter: Normal caliber, 3 mm Liver: Diffusely heterogeneous echotexture throughout the liver corresponding to the abnormality seen on today's MRI suspicious forinfiltrating mass. Main portal vein is patent on color Doppler imaging with normal direction of blood flow towards the liver. The area of possible thrombus seen on MRI at the level of the splenic portal confluence not visualized. IVC: No  abnormality visualized. Pancreas: Visualized portion unremarkable. Spleen: Size and appearance within normal limits. Right Kidney: Length: 11.0 cm. Echogenicity within normal limits. No mass or hydronephrosis visualized. Left Kidney: Length: 10.5 cm. Echogenicity within normal limits. No mass or hydronephrosis visualized. Abdominal aorta: No aneurysm visualized. Other findings: None. IMPRESSION: Heterogeneous appearance throughout much of the left hepatic lobe as seen on today's MRI concerning for infiltrating mass/tumor. Main portal vein is patent. Contracted gallbladder with gallbladder wall thickening likely related to contracted state. Sludge within the gallbladder. No sonographic Murphy sign or visible stones. Electronically Signed   By: Rolm Baptise M.D.   On: 12/19/2018 20:35   Ct Abdomen Pelvis W Contrast  Result Date: 12/21/2018 CLINICAL DATA:  48 y.o. female discharged yesterday for concerns of abdominal pain with a work-up regarding her liver as well as she is meeting SIRS criteria with feverPatient comes back today reports she continues to have significant  pain and discomfort. She has been having ongoing and severe pain located in the mid to right upper abdomen, also she reports fairly significant pain when she takes a deep breath over the right lower lungShe has been feeling warm had a fever when she was in the hospital yesterday. Some nausea. No headaches. Tested negative for Covid a few days ago pain is located primarily in the right upper abdomen under the right rib cage. EXAM: CT ANGIOGRAPHY CHEST CT ABDOMEN AND PELVIS WITH CONTRAST TECHNIQUE: Multidetector CT imaging of the chest was performed using the standard protocol during bolus administration of intravenous contrast. Multiplanar CT image reconstructions and MIPs were obtained to evaluate the vascular anatomy. Multidetector CT imaging of the abdomen and pelvis was performed using the standard protocol during bolus administration of  intravenous contrast. CONTRAST:  6mL OMNIPAQUE IOHEXOL 350 MG/ML SOLN COMPARISON:  Chest CTA and abdomen and pelvis CT dated 12/19/2018. FINDINGS: CTA CHEST FINDINGS Cardiovascular: There is satisfactory opacification of the pulmonary arteries to the segmental level. No evidence of a pulmonary embolism. Mediastinum/Nodes: No enlarged mediastinal, hilar, or axillary lymph nodes. Thyroid gland, trachea, and esophagus demonstrate no significant findings. Lungs/Pleura: Small bilateral pleural effusions. There is dependent opacity in the lower lobes consistent with atelectasis. Small areas of ground-glass opacity and small lung nodules noted. Dominant nodule in the right upper lobe, image 46, series 4, 6 mm. Several other small nodules are new from the prior CT. Area of ground-glass opacity adjacent to the oblique fissure in the left upper lobe is new. No pneumothorax. Musculoskeletal: No chest wall abnormality. No acute or significant osseous findings. Review of the MIP images confirms the above findings. CT ABDOMEN and PELVIS FINDINGS Hepatobiliary: Enlarged liver. There are multiple hypoattenuating areas throughout the liver. These areas are more apparent and more extensive than on the recent prior CT, particularly evident at the dome of segment 4A, along the anterior aspect of the right lobe and medial segment of the left lobe and along the inferior aspect of the right lobe, segment 5 and 6. Gallbladder is mostly collapsed. There is increased attenuation material within the gallbladder similar to the prior CT. No bile duct dilation Pancreas: Unremarkable. No pancreatic ductal dilatation or surrounding inflammatory changes. Spleen: Normal in size without focal abnormality. Adrenals/Urinary Tract: No adrenal masses. Right kidney displaced inferiorly by the enlarged liver. Symmetric renal enhancement and excretion. No renal masses, stones or hydronephrosis. Normal ureters. Bladder wall is prominent, unchanged from the  recent prior study. No bladder masses. Stomach/Bowel: Stomach is unremarkable, mostly decompressed. Small bowel and colon are normal in caliber. No wall thickening or inflammation. Appendix not discretely seen. No findings of appendicitis. Vascular/Lymphatic: As noted on the prior CT, left portal vein is attenuated, but does show enhancement. No evidence of thrombosis. There are venous collaterals in the upper abdomen. Aorta is unremarkable. No enlarged lymph nodes. Prominent Peri celiac node noted on the prior study is stable. Reproductive: Status post hysterectomy. No adnexal masses. Other: Small amount of ascites increased when compared to the prior CT. Musculoskeletal: No acute or significant osseous findings. Review of the MIP images confirms the above findings. IMPRESSION: CHEST CTA 1. No evidence of a pulmonary embolism. 2. Small pleural effusions, new from the prior CT. There is increased opacity in the lower lobes that is consistent with atelectasis. There several new small nodules and small areas of ground-glass opacity when compared to the prior CT. Suspect these are inflammatory given change from the recent exam. ABDOMEN AND PELVIS  CT 1. Multiple hypoattenuating liver lesions. These appear to have progressed when compared to the prior CT, although this change could be due to differences in contrast timing. The liver is enlarged, stable from the recent exam. Findings are concerning for multifocal hepatocellular carcinoma. Follow-up liver MRI without and with contrast, when the patient can tolerate the procedure, is recommended. 2. Small amount ascites has increased in quantity from the prior study. 3. There are vascular collaterals in the upper abdomen, stable. Electronically Signed   By: Lajean Manes M.D.   On: 12/21/2018 18:36   Ct Abdomen Pelvis W Contrast  Result Date: 12/19/2018 CLINICAL DATA:  Chest pain, complex, intermediate/high prob of ACS/PE/AAS; Abd distension Epigastric pain. Exertional  dyspnea. Cough. Weakness. History of breast cancer. EXAM: CT ANGIOGRAPHY CHEST CT ABDOMEN AND PELVIS WITH CONTRAST TECHNIQUE: Multidetector CT imaging of the chest was performed using the standard protocol during bolus administration of intravenous contrast. Multiplanar CT image reconstructions and MIPs were obtained to evaluate the vascular anatomy. Multidetector CT imaging of the abdomen and pelvis was performed using the standard protocol during bolus administration of intravenous contrast. No reported contrast reaction. CONTRAST:  144mL OMNIPAQUE IOHEXOL 350 MG/ML SOLN COMPARISON:  Abdominal CT without contrast yesterday. Chest CT 01/01/2018 FINDINGS: CTA CHEST FINDINGS Cardiovascular: There are no filling defects within the pulmonary arteries to suggest pulmonary embolus. Thoracic aorta is normal in caliber without dissection. Left vertebral artery arises directly from the thoracic aorta, variant arch anatomy. Heart is normal in size. No pericardial effusion. Right chest port in place with tip in the SVC. Mediastinum/Nodes: No mediastinal or hilar adenopathy. No visualized thyroid nodule. No esophageal wall thickening. Lungs/Pleura: Mild patchy areas of subpleural nodular and ground-glass opacities. Basilar findings are unchanged from CT yesterday. 5 mm right upper lobe pulmonary nodule series 6, image 41. Sub solid nodularity slightly more inferiorly in the right lung series 6, image 45 measuring 8 mm. Tiny 2 mm nodule in the right upper lung series 6, image 32, may be fissural. Pulmonary nodules are new from prior chest CT. Perifissural in subpleural opacity in the right upper lobe, also series 6, image 32, nonspecific. No pulmonary edema. No pleural fluid. Trachea and mainstem bronchi are patent. Musculoskeletal: There are no acute or suspicious osseous abnormalities. Review of the MIP images confirms the above findings. CT ABDOMEN and PELVIS FINDINGS Hepatobiliary: Hepatic parenchyma diffusely heterogeneous  with nodular contours. Liver is increased in size from December 2019 CT. Gallbladder is decompressed and not well assessed, intraluminal high density may be stones or sludge. Questionable wall thickening may be related to nondistention. Pancreas: No ductal dilatation or inflammation. Spleen: Normal in size without focal abnormality. Adrenals/Urinary Tract: Normal adrenal glands. No hydronephrosis or perinephric edema. Homogeneous renal enhancement with symmetric excretion on delayed phase imaging. Urinary bladder is partially distended, mild bladder wall thickening. Stomach/Bowel: Lack of enteric contrast and paucity of intra-abdominal fat limits detailed bowel assessment. The stomach is nondistended. Questionable pre-pyloric gastric wall thickening. No small bowel obstruction or inflammatory change. Appendix not discretely visualized, no pericecal inflammation to suggest appendicitis. Transverse colon is tortuous. Mild colonic wall thickening at the splenic flexure, colon is decompressed, this is not well assessed. Questionable areas of descending colonic wall thickening, for example series 11, image 61. Vascular/Lymphatic: Right portal vein is patent. The left portal vein is attenuated without evidence of thrombus. Tortuous splenic vein with prominent collaterals in the left upper quadrant and left retroperitoneum. 9 mm perigastric lymph node series 11, image 29, nonspecific. No  pelvic adenopathy. Reproductive: Uterus surgically absent. Left ovary tentatively visualized and normal. Right ovary not definitively seen. No obvious adnexal mass. Other: Small volume of pelvic ascites measuring simple fluid density, similar to yesterday. Small perihepatic ascites anteriorly. No free air. Musculoskeletal: There are no acute or suspicious osseous abnormalities. Scattered bone islands in the pelvis. Review of the MIP images confirms the above findings. IMPRESSION: CT chest: 1. No pulmonary embolus. 2. Mild patchy areas of  subpleural nodular and ground-glass opacities in both lungs. Slightly more confluent subpleural/perifissural right upper lobe opacity. Findings may be due to atelectasis or infection. 3. Small pulmonary nodules are new from December, infectious/inflammatory or metastatic. CT abdomen/pelvis: 1. Diffusely heterogeneous liver with nodular contours. Liver has increased in size since December 2019 CT. This may be due to underlying cirrhotic change versus diffuse metastatic disease. Recommend further evaluation with abdominal MRI. Left upper quadrant and retroperitoneal collaterals can be seen with portal hypertension. Left portal vein is attenuated, however no discrete thrombus. 2. Questionable pre-pyloric gastric wall thickening, peptic ulcer disease versus gastritis. 3. Bladder wall thickening, small volume abdominopelvic ascites, and areas of colonic wall thickening at the splenic flexure and descending colon are unchanged from CT yesterday. 4. Gallbladder is decompressed and not well assessed, intraluminal high density may be stones or sludge. Questionable gallbladder wall thickening may be related to nondistention or liver disease. This could be further evaluated with MRI or right upper quadrant ultrasound. Electronically Signed   By: Keith Rake M.D.   On: 12/19/2018 05:33   US Biopsy (liver)  Result Date: 12/20/2018 INDICATION: History of left breast carcinoma with imaging evidence of probable diffuse metastatic disease in the liver, more prominently in the left lobe. The patient presents for biopsy. EXAM: ULTRASOUND GUIDED CORE BIOPSY OF LIVER MEDICATIONS: None. ANESTHESIA/SEDATION: Fentanyl 100 mcg IV; Versed 2.0 mg IV Moderate Sedation Time:  20 minutes. The patient was continuously monitored during the procedure by the interventional radiology nurse under my direct supervision. PROCEDURE: The procedure, risks, benefits, and alternatives were explained to the patient. Questions regarding the procedure  were encouraged and answered. The patient understands and consents to the procedure. A time-out was performed prior to initiating the procedure. Ultrasound was used to localize lesions within the liver. The anterior abdominal wall was prepped with chlorhexidine in a sterile fashion, and a sterile drape was applied covering the operative field. A sterile gown and sterile gloves were used for the procedure. Local anesthesia was provided with 1% Lidocaine. Under direct ultrasound guidance, a 17 gauge trocar needle was advanced into the left lobe of the liver. After confirming needle tip position, coaxial 18 gauge core biopsy samples were obtained. A total of 3 samples were obtained and submitted in formalin. A slurry of Gel-Foam pledgets was then slowly injected via the outer needle as the needle was retracted. Additional ultrasound was performed. COMPLICATIONS: None immediate. FINDINGS: Ultrasound demonstrates heterogeneous appearance of the liver with ill-defined lesions throughout the left lobe. Solid core biopsy samples were obtained by sampling lesions within the lateral segment of the left lobe. IMPRESSION: Ultrasound-guided core biopsy performed of hepatic lesions within the left lobe. Electronically Signed   By: Aletta Edouard M.D.   On: 12/20/2018 13:44   Dg Chest Portable 1 View  Result Date: 12/21/2018 CLINICAL DATA:  Pleuritic right chest pain EXAM: PORTABLE CHEST 1 VIEW COMPARISON:  12/18/2018 chest radiograph. FINDINGS: Stable right subclavian Port-A-Cath terminating over the right atrium. Stable cardiomediastinal silhouette with normal heart size. No pneumothorax. No pleural  effusion. No pulmonary edema. Curvilinear left lung base opacities. No acute consolidative airspace disease. IMPRESSION: Curvilinear left lung base opacities, favor scarring or atelectasis. No acute consolidative airspace disease. Electronically Signed   By: Ilona Sorrel M.D.   On: 12/21/2018 13:06   Dg Chest Portable 1  View  Result Date: 12/18/2018 CLINICAL DATA:  Fever abdominal pain EXAM: PORTABLE CHEST 1 VIEW COMPARISON:  12/31/2017 FINDINGS: Right-sided central venous port with tip over the cavoatrial region. No focal airspace disease or effusion. Normal heart size. No pneumothorax. IMPRESSION: No active disease. Electronically Signed   By: Donavan Foil M.D.   On: 12/18/2018 19:49   [place] Ascites (abdomen Limited)  Result Date: 12/30/2018 INDICATION: Ascites. EXAM: ULTRASOUND GUIDED PARACENTESIS MEDICATIONS: None. COMPLICATIONS: None immediate. PROCEDURE: Informed written consent was obtained from the patient after a discussion of the risks, benefits and alternatives to treatment. A timeout was performed prior to the initiation of the procedure. Ultrasound reveals a mild amount of ascites. Given the mild amount of ascites and given patient's low platelet count, it was elected to not perform paracentesis at this time. FINDINGS: Mild amount of ascites. Given patient's low platelet count it was not deemed safe to perform paracentesis at this time. Follow-up exam can be obtained if symptoms worsens. IMPRESSION: Mild amount of ascites. Given patient's low platelet count it was not deemed safe to perform paracentesis this time. Electronically Signed   By: Marcello Moores  Register   On: 12/30/2018 14:37    ASSESSMENT: Stage IV breast cancer with liver metastasis.  PLAN:    1.  Stage IV triple negative breast cancer: Recent liver biopsy confirmed diagnosis.  CT scan results from December 29, 2018 with no obvious progression of disease from 1 week prior.  Despite patient's significant thrombocytopenia which puts her at significant risk for bleeding, she wishes to pursue palliative chemotherapy with [place].  Will dose reduced given her worsening liver dysfunction.  Patient acknowledged that pursuing chemotherapy with her significant thrombocytopenia could possibly make things worse, but she does not wish to pursue hospice at this  time.  Patient received cycle 1, day 1 earlier today.  Appreciate palliative care input. 2.  Abdominal pain: Likely related to malignancy, appreciate ID input.  Discussed possible MRA of liver, but if patient had portal vein thrombosis could not anticoagulate given her thrombocytopenia so this can be put on hold for now. 3.  Elevated white blood cell count: Resolved. 4.  Thrombocytopenia: Proceed cautiously with treatment above.  Monitor daily CBC. 5.  Shortness of breath: Unclear etiology, but possibly could be lymphangitic spread of breast cancer.  This was NOT shared with patient since it would not change the overall plan or prognosis.  Will follow.  Lloyd Huger, MD   01/02/2019 7:46 PM

## 2019-01-02 NOTE — Progress Notes (Signed)
[place]  [phone] [phone]   Name: Shelby [name] Date: 01/02/2019 MRN: [id number]  DOB: December 27, 1970  Patient Care Team: Jerrol Banana., MD as PCP - General (Family Medicine) [name], MD as Consulting Physician (Cardiology) Alfonzo Feller, RN as [facility]    REASON FOR CONSULTATION: Palliative Care consult requested for this 48 y.o. female with multiple medical problems including progressive stage IV triple negative breast cancer who was recently hospitalized 12/18/2018-12/20/2018 with abdominal pain and fevers.  Imaging revealed probable hepatic metastases and patient underwent biopsy on 12/20/2018 with findings consistent with metastatic breast cancer.  Patient was readmitted 12/21/2018-12/27/2018 again with acute right upper quadrant pain and fevers.  Fevers were thought to be from occult infection vs tumor burden. Patient is readmitted 12/04/2018 with abdominal pain. Palliative care was consulted to help address goals and manage ongoing symptoms.  CODE STATUS: Full code  PAST MEDICAL HISTORY: Past Medical History:  Diagnosis Date   Anemia    h/o with pregnancy   Anxiety    Asthma    allergy induced-no inhaler   Cancer ([place])    breast left    Complication of anesthesia    Environmental allergies    Family history of adverse reaction to anesthesia    son-breathing problems-coded 1st time when he was 1 and had another surgery at 78 and had to be admitted for breathing problems   Family history of breast cancer    GERD (gastroesophageal reflux disease)    Headache    migraines   Hypertension    Mitral valve disease    Mitral valve prolapse    Painful menstrual periods    PONV (postoperative nausea and vomiting)    Vertigo     PAST SURGICAL HISTORY:  Past Surgical History:  Procedure Laterality Date   ABDOMINAL HYSTERECTOMY   2010   supracervical    AXILLARY LYMPH NODE DISSECTION Left 07/12/2018   Procedure: AXILLARY LYMPH NODE DISSECTION;  Surgeon: Robert Bellow, MD;  Location: [facility] ORS;  Service: General;  Laterality: Left;   BREAST BIOPSY Left 11/2017   invasive ductal carcinoma and metastatic LN   BREAST BIOPSY WITH SENTINEL LYMPH NODE BIOPSY AND NEEDLE LOCALIZATION Left 07/12/2018   Procedure: BREAST BIOPSY WIDE EXCISION WITH SENTINEL NODE  AND NEEDLE LOCALIZATION OF OXILLARY NODS LEFT;  Surgeon: Robert Bellow, MD;  Location: [facility] ORS;  Service: General;  Laterality: Left;   BUNIONECTOMY Bilateral    MANDIBLE RECONSTRUCTION     age 48-underbite   PORTACATH PLACEMENT Right 12/31/2017   Procedure: INSERTION PORT-A-CATH;  Surgeon: Robert Bellow, MD;  Location: [facility] ORS;  Service: General;  Laterality: Right;   RE-EXCISION OF BREAST LUMPECTOMY Left 07/24/2018   Procedure: RE-EXCISION OF BREAST LUMPECTOMY LEFT;  Surgeon: Robert Bellow, MD;  Location: [facility] ORS;  Service: General;  Laterality: Left;   SHOULDER ARTHROSCOPY WITH OPEN ROTATOR CUFF REPAIR Right 04/26/2017   Procedure: SHOULDER ARTHROSCOPY WITH OPEN ROTATOR CUFF REPAIR,SUBACROMINAL DECOMPRESSION;  Surgeon: Thornton Park, MD;  Location: [facility] OR[facility];  Service: Orthopedics;  Laterality: Right;   TONSILLECTOMY      HEMATOLOGY/ONCOLOGY HISTORY:  Oncology History Overview Note  Initially seen by nurse midwife Melody [place] at gynecologist office for left tender breast mass.  Had breast ultrasound and and mammogram revealing suspicious palpable mass of left breast requiring biopsy.  Biopsy revealed grade 3 invasive ductal carcinoma.  Positive metastatic carcinoma and left axillary  lymph node.  Met with Dr. Grayland Ormond on 12/24/2017 where he recommended neoadjuvant chemotherapy using Adriamycin, Cytoxan with Neulasta support followed by carbo/Taxol.  XRT may not be needed given patient would like mastectomy of left breast.   Had port  placed by Dr. Bary Castilla on 12/31/2017.  CT abdomen/pelvis/chest with contrast on 01/02/2018 did not reveal metastatic disease.  Pretreatment MUGA revealed an EF of 71%.    Stage IV breast cancer in female [facility])  12/23/2017 Initial Diagnosis   Primary cancer of lower-inner quadrant of left female breast ([place])   12/24/2017 Cancer Staging   Staging form: Breast, AJCC 8th Edition - Clinical stage from 12/24/2017: Stage IIIB (cT2, cN1, cM0, G3, ER-, PR-, HER2-) - Signed by Lloyd Huger, MD on 12/24/2017   01/03/2018 - 06/26/2018 Chemotherapy   The patient had DOXOrubicin (ADRIAMYCIN) chemo injection 104 mg, 60 mg/m2 = 104 mg, Intravenous,  Once, 4 of 4 cycles Administration: 104 mg (01/03/2018), 104 mg (01/17/2018), 104 mg (01/31/2018), 104 mg (02/14/2018) palonosetron (ALOXI) injection 0.25 mg, 0.25 mg, Intravenous,  Once, 8 of 8 cycles Administration: 0.25 mg (01/03/2018), 0.25 mg (02/28/2018), 0.25 mg (01/17/2018), 0.25 mg (03/21/2018), 0.25 mg (01/31/2018), 0.25 mg (02/14/2018), 0.25 mg (05/02/2018), 0.25 mg (05/30/2018) pegfilgrastim-cbqv (UDENYCA) injection 6 mg, 6 mg, Subcutaneous, Once, 6 of 6 cycles Administration: 6 mg (01/04/2018), 6 mg (01/18/2018), 6 mg (02/01/2018), 6 mg (02/15/2018), 6 mg (05/31/2018) CARBOplatin (PARAPLATIN) 680 mg in sodium chloride 0.9 % 250 mL chemo infusion, 680 mg (100 % of original dose 676.2 mg), Intravenous,  Once, 4 of 4 cycles Dose modification:   (original dose 676.2 mg, Cycle 5) Administration: 680 mg (02/28/2018), 680 mg (03/21/2018), 680 mg (05/02/2018), 680 mg (05/30/2018) cyclophosphamide (CYTOXAN) 1,000 mg in sodium chloride 0.9 % 250 mL chemo infusion, 1,040 mg, Intravenous,  Once, 4 of 4 cycles Administration: 1,000 mg (01/03/2018), 1,000 mg (01/17/2018), 1,000 mg (01/31/2018), 1,000 mg (02/14/2018) PACLitaxel (TAXOL) 138 mg in sodium chloride 0.9 % 250 mL chemo infusion (</= 24m/m2), 80 mg/m2 = 138 mg, Intravenous,  Once, 4 of 4 cycles Administration: 138 mg  (02/28/2018), 138 mg (03/07/2018), 138 mg (03/14/2018), 138 mg (03/21/2018), 138 mg (04/04/2018), 138 mg (04/18/2018), 138 mg (05/02/2018), 138 mg (05/16/2018), 138 mg (05/23/2018), 138 mg (05/30/2018), 138 mg (06/13/2018), 138 mg (06/20/2018) fosaprepitant (EMEND) 150 mg, dexamethasone (DECADRON) 12 mg in sodium chloride 0.9 % 145 mL IVPB, , Intravenous,  Once, 8 of 8 cycles Administration:  (01/03/2018),  (02/28/2018),  (01/17/2018),  (03/21/2018),  (01/31/2018),  (02/14/2018),  (05/02/2018),  (05/30/2018)  for chemotherapy treatment.    08/01/2018 Cancer Staging   Staging form: Breast, AJCC 8th Edition - Pathologic stage from 08/01/2018: No Stage Recommended (ypT1c, pN1a, cM0, ER-, PR-, HER2-) - Signed by FLloyd Huger MD on 08/01/2018   10/09/2018 - 11/24/2018 Chemotherapy   The patient had [No matching medication found in this treatment plan]  for chemotherapy treatment.    01/02/2019 -  Chemotherapy   The patient had eriBULin mesylate (HALAVEN) 1.9 mg in sodium chloride 0.9 % 100 mL chemo infusion, 1.1 mg/m2 = 1.9 mg (100 % of original dose 1.1 mg/m2), Intravenous,  Once, 1 of 4 cycles Dose modification: 1.1 mg/m2 (original dose 1.1 mg/m2, Cycle 1, Reason: Change in LFTs)  for chemotherapy treatment.      ALLERGIES:  is allergic to hydrocodone; iodine; peanut butter flavor; shellfish allergy; and vancomycin.  MEDICATIONS:  Current Facility-Administered Medications  Medication Dose Route Frequency Provider Last Rate Last Dose   0.9 %  sodium chloride infusion   Intravenous PRN Damita Lack, MD   Stopped at 01/02/19 1128   ALPRAZolam (XANAX) tablet 0.25-0.5 mg  0.25-0.5 mg Oral BID PRN Amin, Ankit Chirag, MD   0.5 mg at 01/02/19 1045   bisacodyl (DULCOLAX) EC tablet 5 mg  5 mg Oral Daily PRN Damita Lack, MD   5 mg at 12/31/18 0858   Chlorhexidine Gluconate Cloth 2 % PADS 6 each  6 each Topical Daily Damita Lack, MD   6 each at 01/02/19 0757   cholecalciferol (VITAMIN D) tablet 2,000  Units  2,000 Units Oral QPM Amin, Ankit Chirag, MD   2,000 Units at 01/01/19 1706   ciprofloxacin (CIPRO) tablet 500 mg  500 mg Oral BID Tsosie Billing, MD   500 mg at 01/02/19 0824   dexamethasone (DECADRON) tablet 8 mg  8 mg Oral Q8H Amin, Ankit Chirag, MD   8 mg at 01/02/19 1309   famotidine (PEPCID) tablet 20 mg  20 mg Oral Daily PRN Amin, Ankit Chirag, MD   20 mg at 12/31/18 1158   feeding supplement (ENSURE ENLIVE) (ENSURE ENLIVE) liquid 237 mL  237 mL Oral BID BM Amin, Ankit Chirag, MD   237 mL at 12/31/18 1158   fentaNYL (DURAGESIC) 25 MCG/HR 1 patch  1 patch Transdermal Q72H Lloyd Huger, MD   1 patch at 01/01/19 1254   fentaNYL (SUBLIMAZE) injection 25 mcg  25 mcg Intravenous Q4H PRN Amin, Ankit Chirag, MD       HYDROmorphone (DILAUDID) injection 1 mg  1 mg Intravenous Q1H PRN [name] Rio Blanco, MD   1 mg at 12/06/2018 1600   HYDROmorphone (DILAUDID) tablet 1 mg  1 mg Oral Q4H PRN Amin, Ankit Chirag, MD       insulin aspart (novoLOG) injection 0-9 Units  0-9 Units Subcutaneous TID WC Amin, Ankit Chirag, MD   1 Units at 01/02/19 1207   losartan (COZAAR) tablet 50 mg  50 mg Oral Daily Amin, Ankit Chirag, MD   50 mg at 01/02/19 0756   multivitamin with minerals tablet 1 tablet  1 tablet Oral Daily Amin, Ankit Chirag, MD   1 tablet at 01/02/19 0756   omega-3 acid ethyl esters (LOVAZA) capsule 1,000 mg  1,000 mg Oral QPM Amin, Ankit Chirag, MD   1,000 mg at 01/01/19 1706   ondansetron (ZOFRAN) tablet 4 mg  4 mg Oral Q6H PRN Amin, Ankit Chirag, MD   4 mg at 01/01/19 0539   Or   ondansetron (ZOFRAN) injection 4 mg  4 mg Intravenous Q6H PRN Amin, Ankit Chirag, MD       pantoprazole (PROTONIX) EC tablet 40 mg  40 mg Oral Daily Amin, Ankit Chirag, MD   40 mg at 01/02/19 0757   polyethylene glycol (MIRALAX / GLYCOLAX) packet 17 g  17 g Oral Daily Lorella Nimrod, MD   17 g at 12/31/18 0845   simethicone (MYLICON) chewable tablet 80 mg  80 mg Oral QID PRN Gerald Dexter,  RPH   80 mg at 01/01/19 1739   sodium chloride flush (NS) 0.9 % injection 10 mL  10 mL Intravenous q morning - 10a Amin, Ankit Chirag, MD   10 mL at 01/02/19 0806   sodium chloride flush (NS) 0.9 % injection 10 mL  10 mL Intravenous PRN Amin, Ankit Chirag, MD   10 mL at 12/02/2018 1735   sucralfate (CARAFATE) tablet 1 g  1 g Oral TID WC & HS Amin, Ankit Chirag,  MD   1 g at 01/02/19 1207   traMADol (ULTRAM) tablet 50 mg  50 mg Oral Q12H PRN Amin, Ankit Chirag, MD        VITAL SIGNS: BP 125/90 (BP Location: Right Arm)    Pulse 86    Temp (!) 97.5 F (36.4 C) (Oral)    Resp 18    Ht 5' 6"  (1.676 m)    Wt 139 lb 5.3 oz (63.2 kg)    SpO2 94%    BMI 22.49 kg/m  Filed Weights   12/16/2018 0855 12/31/18 0500  Weight: 143 lb (64.9 kg) 139 lb 5.3 oz (63.2 kg)    Estimated body mass index is 22.49 kg/m as calculated from the following:   Height as of this encounter: 5' 6"  (1.676 m).   Weight as of this encounter: 139 lb 5.3 oz (63.2 kg).  LABS: CBC:    Component Value Date/Time   WBC 15.2 (H) 01/02/2019 0534   HGB 11.8 (L) 01/02/2019 0534   HGB 11.8 08/13/2017 1556   HCT 34.3 (L) 01/02/2019 0534   HCT 34.8 12/22/2018 1729   PLT 26 (LL) 01/02/2019 0534   PLT 198 08/13/2017 1556   MCV 92.2 01/02/2019 0534   MCV 90 08/13/2017 1556   MCV 92 11/25/2012 1542   NEUTROABS 3.0 12/23/2018 2102   NEUTROABS 8.8 (H) 08/13/2017 1556   NEUTROABS 4.2 11/25/2012 1542   LYMPHSABS 0.3 (L) 12/23/2018 2102   LYMPHSABS 0.4 (L) 08/13/2017 1556   LYMPHSABS 1.7 11/25/2012 1542   MONOABS 0.5 12/23/2018 2102   MONOABS 0.4 11/25/2012 1542   EOSABS 0.0 12/23/2018 2102   EOSABS 0.2 08/13/2017 1556   EOSABS 0.1 11/25/2012 1542   BASOSABS 0.0 12/23/2018 2102   BASOSABS 0.0 08/13/2017 1556   BASOSABS 0.1 11/25/2012 1542   Comprehensive Metabolic Panel:    Component Value Date/Time   NA 132 (L) 01/02/2019 0534   NA 137 06/22/2017 1448   NA 137 11/25/2012 1542   K 4.8 01/02/2019 0534   K 3.7 11/25/2012  1542   CL 93 (L) 01/02/2019 0534   CL 103 11/25/2012 1542   CO2 29 01/02/2019 0534   CO2 28 11/25/2012 1542   BUN 24 (H) 01/02/2019 0534   BUN 17 06/22/2017 1448   BUN 15 11/25/2012 1542   CREATININE 0.51 01/02/2019 0534   CREATININE 0.73 11/25/2012 1542   GLUCOSE 132 (H) 01/02/2019 0534   GLUCOSE 103 (H) 11/25/2012 1542   CALCIUM 8.7 (L) 01/02/2019 0534   CALCIUM 9.3 11/25/2012 1542   AST 179 (H) 01/02/2019 0534   AST 18 11/25/2012 1542   ALT 108 (H) 01/02/2019 0534   ALT 17 11/25/2012 1542   ALKPHOS 186 (H) 01/02/2019 0534   ALKPHOS 60 11/25/2012 1542   BILITOT 1.0 01/02/2019 0534   BILITOT 0.4 06/22/2017 1448   BILITOT 0.3 11/25/2012 1542   PROT 5.6 (L) 01/02/2019 0534   PROT 7.2 06/22/2017 1448   PROT 7.4 11/25/2012 1542   ALBUMIN 2.7 (L) 01/02/2019 0534   ALBUMIN 5.0 06/22/2017 1448   ALBUMIN 4.0 11/25/2012 1542    RADIOGRAPHIC STUDIES: Ct Abdomen Pelvis Wo Contrast  Addendum Date: 12/18/2018   ADDENDUM REPORT: 12/23/2018 10:16 ADDENDUM: Urinary bladder wall mildly thickened. A degree of cystitis questioned. Electronically Signed   By: Lowella Grip III M.D.   On: 12/17/2018 10:16   Result Date: 12/13/2018 CLINICAL DATA:  Abdominal pain with nausea and distention. Breast carcinoma with known liver metastases EXAM: CT ABDOMEN AND  PELVIS WITHOUT CONTRAST TECHNIQUE: Multidetector CT imaging of the abdomen and pelvis was performed following the standard protocol without oral or IV contrast. COMPARISON:  December 21, 2018 FINDINGS: Lower chest: There is a right pleural effusion with atelectatic change in the right base. There is atelectatic change in the left base with rather minimal left pleural effusion. Small pericardial effusion noted. Hepatobiliary: There is widespread metastatic disease is seen throughout the liver, better delineated on recent contrast-enhanced study. Gallbladder is largely collapsed. No biliary duct dilatation evident. Pancreas: No pancreatic mass or  inflammatory focus. Spleen: No splenic lesions are evident. Adrenals/Urinary Tract: Adrenals appear unremarkable bilaterally. Kidneys bilaterally show no evident mass or hydronephrosis on either side. There is no evident renal or ureteral calculus on either side. Urinary bladder is midline. The urinary bladder wall thickness is mildly increased. Stomach/Bowel: There is moderate stool in the colon. There is no appreciable bowel wall or mesenteric thickening. No evident bowel obstruction. Terminal ileal region appears unremarkable. There is no free air or portal venous air. Vascular/Lymphatic: No abdominal aortic aneurysm. No arterial lesions seen on this noncontrast enhanced study. Multiple upper abdominal venous collaterals are again noted without change from recent study. A focal periceliac lymph node measuring 1.6 x 1.3 cm is stable. No new adenopathy evident compared to recent study. Reproductive: Uterus absent.  No pelvic mass evident. Other: There is moderate ascites, slightly increased compared to recent study. No abscess evident in the abdomen or pelvis. No periappendiceal region inflammation. Appendix not appreciable. Musculoskeletal: No lytic or destructive bone lesions are evident. A small sclerotic focus in the right sacrum may represent a small bone island. No intramuscular lesions are evident. IMPRESSION: 1.  Moderate ascites, slightly increased compared to recent study. 2.  Widespread hepatic metastatic disease. 3. No bowel obstruction. No abscess in the abdomen or pelvis. No periappendiceal region inflammation. 4.  Enlarged periceliac lymph node, suspected due to neoplasm. 5. Venous collaterals in the upper abdomen, primarily on the left, stable. Further assessment with respect to etiology not possible without intravenous contrast. Appearance stable compared to recent contrast enhanced CT examination. 6.  Uterus absent. 7. Small pleural effusions with bibasilar atelectatic change. Small pericardial  effusion. Electronically Signed: By: Lowella Grip III M.D. On: 12/23/2018 10:13   Ct Abdomen Pelvis Wo Contrast  Addendum Date: 12/18/2018   ADDENDUM REPORT: 12/18/2018 20:18 ADDENDUM: These results were called by telephone on 12/18/2018 at 8:18 pm to provider [facility] , who verbally acknowledged these results. Electronically Signed   By: Lovena Le M.D.   On: 12/18/2018 20:18   Result Date: 12/18/2018 CLINICAL DATA:  Abdominal pain and distension, undergoing chemotherapy for breast cancer, history of IBS and diverticulitis EXAM: CT ABDOMEN AND PELVIS WITHOUT CONTRAST TECHNIQUE: Multidetector CT imaging of the abdomen and pelvis was performed following the standard protocol without IV contrast. COMPARISON:  CT abdomen pelvis 01/01/2018 FINDINGS: Lower chest: The patchy areas of subpleural ground-glass opacity could reflect atelectasis or early infection. Cardiac size is normal. Trace pericardial effusion, similar to prior. Hepatobiliary: Ill-defined regions of hypoattenuation are seen the anterior left lobe liver. Slightly nodular hepatic surface contour which is new from comparison exam. These features are both incompletely assessed in the absence of contrast media. Gallbladder appears largely contracted at the time of exam. No biliary ductal dilatation. Pancreas: Unremarkable. No pancreatic ductal dilatation or surrounding inflammatory changes. Spleen: Normal in size without focal abnormality. Adrenals/Urinary Tract: No suspicious adrenal lesions. No visible or contour deforming renal lesions. No urolithiasis or  hydronephrosis mild circumferential bladder wall thickening. Stomach/Bowel: Distal esophagus, stomach and duodenal sweep are unremarkable. No small bowel wall thickening or dilatation. No evidence of obstruction. Appendix is not clearly identified. No pericecal inflammation. There is some mild mural thickening of the splenic flexure and descending colon remaining portions of the colon are  free of mural thickening or dilatation. Vascular/Lymphatic: Upper abdominal venous collateralization and splenorenal shunting is noted. No suspicious or enlarged lymph nodes in the included lymphatic chains. Reproductive: Uterus is surgically absent. No concerning adnexal lesions. Other: Small volume of low-attenuation ascites noted in the low abdomen. Small volume of perihepatic low-attenuation ascites is present as well. Musculoskeletal: Multilevel degenerative changes are present in the imaged portions of the spine. No acute osseous abnormality or suspicious osseous lesion. IMPRESSION: 1. Interval development of cirrhotic stigmata including a nodular liver surface contour and increasing upper abdominal venous collateralization. Small volume of ascites is noted as well. 2. Ill-defined regions of hypoattenuation are seen within the anterior left lobe liver, which are incompletely assessed in the absence of contrast media. Furthermore findings are worrisome in the setting of known malignancy and potential intrinsic liver disease. Consider further evaluation with MRI or ultrasound if patient is unable to tolerate CT contrast due to allergy. 3. Mild mural thickening of the splenic flexure and descending colon, could reflect change related to the additional cirrhotic findings/portal colopathy though should exclude a an acute colitis clinically. 4. Patchy areas of subpleural ground-glass opacity in the visualized lung bases could reflect atelectasis or early infection. 5. Mild circumferential bladder wall thickening, which may be related to underdistention. Correlate with urinalysis to exclude cystitis. 6. Unchanged trace pericardial effusion. Electronically Signed: By: Lovena Le M.D. On: 12/18/2018 20:07   Dg Chest 1 View  Result Date: 12/23/2018 CLINICAL DATA:  Cirrhosis, ascites, SIRS, history LEFT breast cancer, hypertension EXAM: CHEST  1 VIEW COMPARISON:  Portable exam 1016 hours compared to 12/21/2018  FINDINGS: RIGHT subclavian Port-A-Cath with tip projecting over superior RIGHT atrium, unchanged. Normal heart size, mediastinal contours, and pulmonary vascularity. Bibasilar atelectasis. Remaining lungs clear. No pleural effusion or pneumothorax. IMPRESSION: Bibasilar atelectasis. Electronically Signed   By: Lavonia Dana M.D.   On: 12/23/2018 10:58   Ct Angio Chest Pe W Or Wo Contrast  Result Date: 12/31/2018 CLINICAL DATA:  Hypoxemia EXAM: CT ANGIOGRAPHY CHEST WITH CONTRAST TECHNIQUE: Multidetector CT imaging of the chest was performed using the standard protocol during bolus administration of intravenous contrast. Multiplanar CT image reconstructions and MIPs were obtained to evaluate the vascular anatomy. CONTRAST:  73m OMNIPAQUE IOHEXOL 350 MG/ML SOLN COMPARISON:  None. FINDINGS: Cardiovascular: No filling defects in the pulmonary arteries to suggest pulmonary emboli. Heart is normal size. Aorta is normal caliber. Small pericardial effusion. Mediastinum/Nodes: No mediastinal, hilar, or axillary adenopathy. Trachea and esophagus are unremarkable. Thyroid unremarkable. Lungs/Pleura: Trace left effusion and small right effusion. Bilateral lower lobe airspace opacities are noted, right greater than left which could reflect atelectasis or pneumonia. There are scattered predominantly peripheral ground-glass opacities in the upper lobes which could reflect atypical/viral infection. Upper Abdomen: While not imaged in its entirety, the liver appears prominent. Recommend clinical correlation for possible hepatomegaly. Small amount of ascites surrounding the liver and spleen. Musculoskeletal: No acute bony abnormality. Chest wall soft tissues are unremarkable. Right chest wall Port-A-Cath in place with the tip in the SVC. Review of the MIP images confirms the above findings. IMPRESSION: No evidence of pulmonary embolus. Small right effusion and trace left effusion. Small pericardial effusion. Bilateral  lower lobe  atelectasis or infiltrate/pneumonia. Patchy peripheral ground-glass opacities in the upper lobes could reflect atypical/viral pneumonia. COVID pneumonia cannot be excluded. Liver appears prominent, but is not imaged in its entirety. Recommend clinical correlation for possible hepatomegaly. Perihepatic and perisplenic ascites. Electronically Signed   By: Rolm Baptise M.D.   On: 12/31/2018 19:30   Ct Angio Chest Pe W And/or Wo Contrast  Result Date: 12/21/2018 CLINICAL DATA:  47 y.o. female discharged yesterday for concerns of abdominal pain with a work-up regarding her liver as well as she is meeting SIRS criteria with feverPatient comes back today reports she continues to have significant pain and discomfort. She has been having ongoing and severe pain located in the mid to right upper abdomen, also she reports fairly significant pain when she takes a deep breath over the right lower lungShe has been feeling warm had a fever when she was in the hospital yesterday. Some nausea. No headaches. Tested negative for Covid a few days ago pain is located primarily in the right upper abdomen under the right rib cage. EXAM: CT ANGIOGRAPHY CHEST CT ABDOMEN AND PELVIS WITH CONTRAST TECHNIQUE: Multidetector CT imaging of the chest was performed using the standard protocol during bolus administration of intravenous contrast. Multiplanar CT image reconstructions and MIPs were obtained to evaluate the vascular anatomy. Multidetector CT imaging of the abdomen and pelvis was performed using the standard protocol during bolus administration of intravenous contrast. CONTRAST:  37m OMNIPAQUE IOHEXOL 350 MG/ML SOLN COMPARISON:  Chest CTA and abdomen and pelvis CT dated 12/19/2018. FINDINGS: CTA CHEST FINDINGS Cardiovascular: There is satisfactory opacification of the pulmonary arteries to the segmental level. No evidence of a pulmonary embolism. Mediastinum/Nodes: No enlarged mediastinal, hilar, or axillary lymph nodes. Thyroid  gland, trachea, and esophagus demonstrate no significant findings. Lungs/Pleura: Small bilateral pleural effusions. There is dependent opacity in the lower lobes consistent with atelectasis. Small areas of ground-glass opacity and small lung nodules noted. Dominant nodule in the right upper lobe, image 46, series 4, 6 mm. Several other small nodules are new from the prior CT. Area of ground-glass opacity adjacent to the oblique fissure in the left upper lobe is new. No pneumothorax. Musculoskeletal: No chest wall abnormality. No acute or significant osseous findings. Review of the MIP images confirms the above findings. CT ABDOMEN and PELVIS FINDINGS Hepatobiliary: Enlarged liver. There are multiple hypoattenuating areas throughout the liver. These areas are more apparent and more extensive than on the recent prior CT, particularly evident at the dome of segment 4A, along the anterior aspect of the right lobe and medial segment of the left lobe and along the inferior aspect of the right lobe, segment 5 and 6. Gallbladder is mostly collapsed. There is increased attenuation material within the gallbladder similar to the prior CT. No bile duct dilation Pancreas: Unremarkable. No pancreatic ductal dilatation or surrounding inflammatory changes. Spleen: Normal in size without focal abnormality. Adrenals/Urinary Tract: No adrenal masses. Right kidney displaced inferiorly by the enlarged liver. Symmetric renal enhancement and excretion. No renal masses, stones or hydronephrosis. Normal ureters. Bladder wall is prominent, unchanged from the recent prior study. No bladder masses. Stomach/Bowel: Stomach is unremarkable, mostly decompressed. Small bowel and colon are normal in caliber. No wall thickening or inflammation. Appendix not discretely seen. No findings of appendicitis. Vascular/Lymphatic: As noted on the prior CT, left portal vein is attenuated, but does show enhancement. No evidence of thrombosis. There are venous  collaterals in the upper abdomen. Aorta is unremarkable. No enlarged lymph  nodes. Prominent Peri celiac node noted on the prior study is stable. Reproductive: Status post hysterectomy. No adnexal masses. Other: Small amount of ascites increased when compared to the prior CT. Musculoskeletal: No acute or significant osseous findings. Review of the MIP images confirms the above findings. IMPRESSION: CHEST CTA 1. No evidence of a pulmonary embolism. 2. Small pleural effusions, new from the prior CT. There is increased opacity in the lower lobes that is consistent with atelectasis. There several new small nodules and small areas of ground-glass opacity when compared to the prior CT. Suspect these are inflammatory given change from the recent exam. ABDOMEN AND PELVIS CT 1. Multiple hypoattenuating liver lesions. These appear to have progressed when compared to the prior CT, although this change could be due to differences in contrast timing. The liver is enlarged, stable from the recent exam. Findings are concerning for multifocal hepatocellular carcinoma. Follow-up liver MRI without and with contrast, when the patient can tolerate the procedure, is recommended. 2. Small amount ascites has increased in quantity from the prior study. 3. There are vascular collaterals in the upper abdomen, stable. Electronically Signed   By: Lajean Manes M.D.   On: 12/21/2018 18:36   Ct Angio Chest Pe W And/or Wo Contrast  Result Date: 12/19/2018 CLINICAL DATA:  Chest pain, complex, intermediate/high prob of ACS/PE/AAS; Abd distension Epigastric pain. Exertional dyspnea. Cough. Weakness. History of breast cancer. EXAM: CT ANGIOGRAPHY CHEST CT ABDOMEN AND PELVIS WITH CONTRAST TECHNIQUE: Multidetector CT imaging of the chest was performed using the standard protocol during bolus administration of intravenous contrast. Multiplanar CT image reconstructions and MIPs were obtained to evaluate the vascular anatomy. Multidetector CT imaging  of the abdomen and pelvis was performed using the standard protocol during bolus administration of intravenous contrast. No reported contrast reaction. CONTRAST:  121m OMNIPAQUE IOHEXOL 350 MG/ML SOLN COMPARISON:  Abdominal CT without contrast yesterday. Chest CT 01/01/2018 FINDINGS: CTA CHEST FINDINGS Cardiovascular: There are no filling defects within the pulmonary arteries to suggest pulmonary embolus. Thoracic aorta is normal in caliber without dissection. Left vertebral artery arises directly from the thoracic aorta, variant arch anatomy. Heart is normal in size. No pericardial effusion. Right chest port in place with tip in the SVC. Mediastinum/Nodes: No mediastinal or hilar adenopathy. No visualized thyroid nodule. No esophageal wall thickening. Lungs/Pleura: Mild patchy areas of subpleural nodular and ground-glass opacities. Basilar findings are unchanged from CT yesterday. 5 mm right upper lobe pulmonary nodule series 6, image 41. Sub solid nodularity slightly more inferiorly in the right lung series 6, image 45 measuring 8 mm. Tiny 2 mm nodule in the right upper lung series 6, image 32, may be fissural. Pulmonary nodules are new from prior chest CT. Perifissural in subpleural opacity in the right upper lobe, also series 6, image 32, nonspecific. No pulmonary edema. No pleural fluid. Trachea and mainstem bronchi are patent. Musculoskeletal: There are no acute or suspicious osseous abnormalities. Review of the MIP images confirms the above findings. CT ABDOMEN and PELVIS FINDINGS Hepatobiliary: Hepatic parenchyma diffusely heterogeneous with nodular contours. Liver is increased in size from December 2019 CT. Gallbladder is decompressed and not well assessed, intraluminal high density may be stones or sludge. Questionable wall thickening may be related to nondistention. Pancreas: No ductal dilatation or inflammation. Spleen: Normal in size without focal abnormality. Adrenals/Urinary Tract: Normal adrenal  glands. No hydronephrosis or perinephric edema. Homogeneous renal enhancement with symmetric excretion on delayed phase imaging. Urinary bladder is partially distended, mild bladder wall thickening. Stomach/Bowel: Lack  of enteric contrast and paucity of intra-abdominal fat limits detailed bowel assessment. The stomach is nondistended. Questionable pre-pyloric gastric wall thickening. No small bowel obstruction or inflammatory change. Appendix not discretely visualized, no pericecal inflammation to suggest appendicitis. Transverse colon is tortuous. Mild colonic wall thickening at the splenic flexure, colon is decompressed, this is not well assessed. Questionable areas of descending colonic wall thickening, for example series 11, image 61. Vascular/Lymphatic: Right portal vein is patent. The left portal vein is attenuated without evidence of thrombus. Tortuous splenic vein with prominent collaterals in the left upper quadrant and left retroperitoneum. 9 mm perigastric lymph node series 11, image 29, nonspecific. No pelvic adenopathy. Reproductive: Uterus surgically absent. Left ovary tentatively visualized and normal. Right ovary not definitively seen. No obvious adnexal mass. Other: Small volume of pelvic ascites measuring simple fluid density, similar to yesterday. Small perihepatic ascites anteriorly. No free air. Musculoskeletal: There are no acute or suspicious osseous abnormalities. Scattered bone islands in the pelvis. Review of the MIP images confirms the above findings. IMPRESSION: CT chest: 1. No pulmonary embolus. 2. Mild patchy areas of subpleural nodular and ground-glass opacities in both lungs. Slightly more confluent subpleural/perifissural right upper lobe opacity. Findings may be due to atelectasis or infection. 3. Small pulmonary nodules are new from December, infectious/inflammatory or metastatic. CT abdomen/pelvis: 1. Diffusely heterogeneous liver with nodular contours. Liver has increased in size  since December 2019 CT. This may be due to underlying cirrhotic change versus diffuse metastatic disease. Recommend further evaluation with abdominal MRI. Left upper quadrant and retroperitoneal collaterals can be seen with portal hypertension. Left portal vein is attenuated, however no discrete thrombus. 2. Questionable pre-pyloric gastric wall thickening, peptic ulcer disease versus gastritis. 3. Bladder wall thickening, small volume abdominopelvic ascites, and areas of colonic wall thickening at the splenic flexure and descending colon are unchanged from CT yesterday. 4. Gallbladder is decompressed and not well assessed, intraluminal high density may be stones or sludge. Questionable gallbladder wall thickening may be related to nondistention or liver disease. This could be further evaluated with MRI or right upper quadrant ultrasound. Electronically Signed   By: Keith Rake M.D.   On: 12/19/2018 05:33   Mr Abdomen W Wo Contrast  Result Date: 12/19/2018 CLINICAL DATA:  Abdominal pain in a patient with breast cancer. EXAM: MRI ABDOMEN WITHOUT AND WITH CONTRAST TECHNIQUE: Multiplanar multisequence MR imaging of the abdomen was performed both before and after the administration of intravenous contrast. CONTRAST:  52m GADAVIST GADOBUTROL 1 MMOL/ML IV SOLN COMPARISON:  CT of 12/19/2018 FINDINGS: Lower chest: Limited assessment of the lower chest on MRI is unremarkable. Hepatobiliary: Diffuse infiltration of much of the liver, nearly the entire left hepatic lobe, medial section and lateral section in addition to anterior right hepatic lobe with confluent infiltrative restricted diffusion and heterogeneous enhancement measuring approximately 21 by 7.4 cm. Diffuse metastatic disease is strongly suspected. Boundaries are well-demarcated without adjacent edema in the bordering hepatic parenchyma Discrete hepatic lesions are also scattered about the right hepatic lobe largest in the dome of the right hemi liver  measuring approximately 2.5 cm in greatest dimension. These discrete lesions display a targetoid appearance. There are numerous lesions in the right hepatic lobe, lesions are found in all hepatic subsegment. Pancreas: No mass, inflammatory changes, or other parenchymal abnormality identified. Mild ductal distension in the tail of the pancreas may be due to adjacent mass effect. No visible pancreatic lesion is noted. Spleen:  Within normal limits in size and appearance. Adrenals/Urinary Tract: No  masses identified. No evidence of hydronephrosis. Stomach/Bowel: Limited assessment of the gastrointestinal tract without signs of acute process. Vascular/Lymphatic: Left-sided peri-renal venous collaterals of uncertain significance perhaps related to elevated portal pressures in the setting of diffuse hepatic involvement. 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 Right portal vein is patent. Scattered small lymph nodes in the upper abdomen. There is either slow flow or thrombus within the main portal vein at the level of the splenic portal confluence extending in the splenic vein. The SMV is patent. There is mass effect upon the vessel at the level of the splenic portal confluence due to hepatic enlargement. This is best seen on subtraction images, image 54, series 20. Note that slow flow in an otherwise opacified vessel can cause artifact with a similar appearance however, this is seen on contrasted and non contrasted imaging with a similar appearance. Other:  None. Musculoskeletal: No signs of acute or destructive bone process. IMPRESSION: 1. Diffuse infiltration of much of the liver, nearly the entire left hepatic lobe, with confluent infiltrative restricted diffusion and heterogeneous enhancement measuring approximately 21 by 7.4 cm. Diffuse metastatic disease is strongly suspected with multifocal lesions in the right hepatic lobe. Superimposed infection is difficult to exclude though areas at the boundary of these lesions show  no overt signs of edema. 2. Highly narrowed or occluded left portal vein with potential thrombus versus slow flow in the splenoportal confluence. This was patent on the exam of 12/19/2018; however, given above findings a venous phase CT or even ultrasound hepatic Doppler with special attention to the splenic portal confluence may be helpful to exclude this possibility, finding is also exhibited on TrueFISP sequence (image 24, series 10). 3. 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 4. Mild ductal distension in the tail of the pancreas, potentially due to mass effect. Attention on follow-up. 5. Critical Value/emergent results were called by telephone at the time of interpretation on 12/19/2018 at 6:43 pm to [place] , who verbally acknowledged these results. Electronically Signed   By: Zetta Bills M.D.   On: 12/19/2018 18:24   US Abdomen Complete  Result Date: 12/19/2018 CLINICAL DATA:  Abdominal pain EXAM: ABDOMEN ULTRASOUND COMPLETE COMPARISON:  MRI earlier today FINDINGS: Gallbladder: Gallbladder is contracted with associated mild wall thickening measuring up to 4 mm. Sludge noted within the gallbladder. No visible stones. Common bile duct: Diameter: Normal caliber, 3 mm Liver: Diffusely heterogeneous echotexture throughout the liver corresponding to the abnormality seen on today's MRI suspicious forinfiltrating mass. Main portal vein is patent on color Doppler imaging with normal direction of blood flow towards the liver. The area of possible thrombus seen on MRI at the level of the splenic portal confluence not visualized. IVC: No abnormality visualized. Pancreas: Visualized portion unremarkable. Spleen: Size and appearance within normal limits. Right Kidney: Length: 11.0 cm. Echogenicity within normal limits. No mass or hydronephrosis visualized. Left Kidney: Length: 10.5 cm. Echogenicity within  normal limits. No mass or hydronephrosis visualized. Abdominal aorta: No aneurysm visualized. Other findings: None. IMPRESSION: Heterogeneous appearance throughout much of the left hepatic lobe as seen on today's MRI concerning for infiltrating mass/tumor. Main portal vein is patent. Contracted gallbladder with gallbladder wall thickening likely related to contracted state. Sludge within the gallbladder. No sonographic Murphy sign or visible stones. Electronically Signed   By: Rolm Baptise M.D.   On: 12/19/2018 20:35   Ct Abdomen Pelvis W Contrast  Result Date: 12/21/2018 CLINICAL  DATA:  48 y.o. female discharged yesterday for concerns of abdominal pain with a work-up regarding her liver as well as she is meeting SIRS criteria with feverPatient comes back today reports she continues to have significant pain and discomfort. She has been having ongoing and severe pain located in the mid to right upper abdomen, also she reports fairly significant pain when she takes a deep breath over the right lower lungShe has been feeling warm had a fever when she was in the hospital yesterday. Some nausea. No headaches. Tested negative for Covid a few days ago pain is located primarily in the right upper abdomen under the right rib cage. EXAM: CT ANGIOGRAPHY CHEST CT ABDOMEN AND PELVIS WITH CONTRAST TECHNIQUE: Multidetector CT imaging of the chest was performed using the standard protocol during bolus administration of intravenous contrast. Multiplanar CT image reconstructions and MIPs were obtained to evaluate the vascular anatomy. Multidetector CT imaging of the abdomen and pelvis was performed using the standard protocol during bolus administration of intravenous contrast. CONTRAST:  65m OMNIPAQUE IOHEXOL 350 MG/ML SOLN COMPARISON:  Chest CTA and abdomen and pelvis CT dated 12/19/2018. FINDINGS: CTA CHEST FINDINGS Cardiovascular: There is satisfactory opacification of the pulmonary arteries to the segmental level. No  evidence of a pulmonary embolism. Mediastinum/Nodes: No enlarged mediastinal, hilar, or axillary lymph nodes. Thyroid gland, trachea, and esophagus demonstrate no significant findings. Lungs/Pleura: Small bilateral pleural effusions. There is dependent opacity in the lower lobes consistent with atelectasis. Small areas of ground-glass opacity and small lung nodules noted. Dominant nodule in the right upper lobe, image 46, series 4, 6 mm. Several other small nodules are new from the prior CT. Area of ground-glass opacity adjacent to the oblique fissure in the left upper lobe is new. No pneumothorax. Musculoskeletal: No chest wall abnormality. No acute or significant osseous findings. Review of the MIP images confirms the above findings. CT ABDOMEN and PELVIS FINDINGS Hepatobiliary: Enlarged liver. There are multiple hypoattenuating areas throughout the liver. These areas are more apparent and more extensive than on the recent prior CT, particularly evident at the dome of segment 4A, along the anterior aspect of the right lobe and medial segment of the left lobe and along the inferior aspect of the right lobe, segment 5 and 6. Gallbladder is mostly collapsed. There is increased attenuation material within the gallbladder similar to the prior CT. No bile duct dilation Pancreas: Unremarkable. No pancreatic ductal dilatation or surrounding inflammatory changes. Spleen: Normal in size without focal abnormality. Adrenals/Urinary Tract: No adrenal masses. Right kidney displaced inferiorly by the enlarged liver. Symmetric renal enhancement and excretion. No renal masses, stones or hydronephrosis. Normal ureters. Bladder wall is prominent, unchanged from the recent prior study. No bladder masses. Stomach/Bowel: Stomach is unremarkable, mostly decompressed. Small bowel and colon are normal in caliber. No wall thickening or inflammation. Appendix not discretely seen. No findings of appendicitis. Vascular/Lymphatic: As noted on  the prior CT, left portal vein is attenuated, but does show enhancement. No evidence of thrombosis. There are venous collaterals in the upper abdomen. Aorta is unremarkable. No enlarged lymph nodes. Prominent Peri celiac node noted on the prior study is stable. Reproductive: Status post hysterectomy. No adnexal masses. Other: Small amount of ascites increased when compared to the prior CT. Musculoskeletal: No acute or significant osseous findings. Review of the MIP images confirms the above findings. IMPRESSION: CHEST CTA 1. No evidence of a pulmonary embolism. 2. Small pleural effusions, new from the prior CT. There is increased opacity in the  lower lobes that is consistent with atelectasis. There several new small nodules and small areas of ground-glass opacity when compared to the prior CT. Suspect these are inflammatory given change from the recent exam. ABDOMEN AND PELVIS CT 1. Multiple hypoattenuating liver lesions. These appear to have progressed when compared to the prior CT, although this change could be due to differences in contrast timing. The liver is enlarged, stable from the recent exam. Findings are concerning for multifocal hepatocellular carcinoma. Follow-up liver MRI without and with contrast, when the patient can tolerate the procedure, is recommended. 2. Small amount ascites has increased in quantity from the prior study. 3. There are vascular collaterals in the upper abdomen, stable. Electronically Signed   By: Lajean Manes M.D.   On: 12/21/2018 18:36   Ct Abdomen Pelvis W Contrast  Result Date: 12/19/2018 CLINICAL DATA:  Chest pain, complex, intermediate/high prob of ACS/PE/AAS; Abd distension Epigastric pain. Exertional dyspnea. Cough. Weakness. History of breast cancer. EXAM: CT ANGIOGRAPHY CHEST CT ABDOMEN AND PELVIS WITH CONTRAST TECHNIQUE: Multidetector CT imaging of the chest was performed using the standard protocol during bolus administration of intravenous contrast. Multiplanar  CT image reconstructions and MIPs were obtained to evaluate the vascular anatomy. Multidetector CT imaging of the abdomen and pelvis was performed using the standard protocol during bolus administration of intravenous contrast. No reported contrast reaction. CONTRAST:  138m OMNIPAQUE IOHEXOL 350 MG/ML SOLN COMPARISON:  Abdominal CT without contrast yesterday. Chest CT 01/01/2018 FINDINGS: CTA CHEST FINDINGS Cardiovascular: There are no filling defects within the pulmonary arteries to suggest pulmonary embolus. Thoracic aorta is normal in caliber without dissection. Left vertebral artery arises directly from the thoracic aorta, variant arch anatomy. Heart is normal in size. No pericardial effusion. Right chest port in place with tip in the SVC. Mediastinum/Nodes: No mediastinal or hilar adenopathy. No visualized thyroid nodule. No esophageal wall thickening. Lungs/Pleura: Mild patchy areas of subpleural nodular and ground-glass opacities. Basilar findings are unchanged from CT yesterday. 5 mm right upper lobe pulmonary nodule series 6, image 41. Sub solid nodularity slightly more inferiorly in the right lung series 6, image 45 measuring 8 mm. Tiny 2 mm nodule in the right upper lung series 6, image 32, may be fissural. Pulmonary nodules are new from prior chest CT. Perifissural in subpleural opacity in the right upper lobe, also series 6, image 32, nonspecific. No pulmonary edema. No pleural fluid. Trachea and mainstem bronchi are patent. Musculoskeletal: There are no acute or suspicious osseous abnormalities. Review of the MIP images confirms the above findings. CT ABDOMEN and PELVIS FINDINGS Hepatobiliary: Hepatic parenchyma diffusely heterogeneous with nodular contours. Liver is increased in size from December 2019 CT. Gallbladder is decompressed and not well assessed, intraluminal high density may be stones or sludge. Questionable wall thickening may be related to nondistention. Pancreas: No ductal dilatation or  inflammation. Spleen: Normal in size without focal abnormality. Adrenals/Urinary Tract: Normal adrenal glands. No hydronephrosis or perinephric edema. Homogeneous renal enhancement with symmetric excretion on delayed phase imaging. Urinary bladder is partially distended, mild bladder wall thickening. Stomach/Bowel: Lack of enteric contrast and paucity of intra-abdominal fat limits detailed bowel assessment. The stomach is nondistended. Questionable pre-pyloric gastric wall thickening. No small bowel obstruction or inflammatory change. Appendix not discretely visualized, no pericecal inflammation to suggest appendicitis. Transverse colon is tortuous. Mild colonic wall thickening at the splenic flexure, colon is decompressed, this is not well assessed. Questionable areas of descending colonic wall thickening, for example series 11, image 61. Vascular/Lymphatic: Right portal vein  is patent. The left portal vein is attenuated without evidence of thrombus. Tortuous splenic vein with prominent collaterals in the left upper quadrant and left retroperitoneum. 9 mm perigastric lymph node series 11, image 29, nonspecific. No pelvic adenopathy. Reproductive: Uterus surgically absent. Left ovary tentatively visualized and normal. Right ovary not definitively seen. No obvious adnexal mass. Other: Small volume of pelvic ascites measuring simple fluid density, similar to yesterday. Small perihepatic ascites anteriorly. No free air. Musculoskeletal: There are no acute or suspicious osseous abnormalities. Scattered bone islands in the pelvis. Review of the MIP images confirms the above findings. IMPRESSION: CT chest: 1. No pulmonary embolus. 2. Mild patchy areas of subpleural nodular and ground-glass opacities in both lungs. Slightly more confluent subpleural/perifissural right upper lobe opacity. Findings may be due to atelectasis or infection. 3. Small pulmonary nodules are new from December, infectious/inflammatory or metastatic.  CT abdomen/pelvis: 1. Diffusely heterogeneous liver with nodular contours. Liver has increased in size since December 2019 CT. This may be due to underlying cirrhotic change versus diffuse metastatic disease. Recommend further evaluation with abdominal MRI. Left upper quadrant and retroperitoneal collaterals can be seen with portal hypertension. Left portal vein is attenuated, however no discrete thrombus. 2. Questionable pre-pyloric gastric wall thickening, peptic ulcer disease versus gastritis. 3. Bladder wall thickening, small volume abdominopelvic ascites, and areas of colonic wall thickening at the splenic flexure and descending colon are unchanged from CT yesterday. 4. Gallbladder is decompressed and not well assessed, intraluminal high density may be stones or sludge. Questionable gallbladder wall thickening may be related to nondistention or liver disease. This could be further evaluated with MRI or right upper quadrant ultrasound. Electronically Signed   By: Keith Rake M.D.   On: 12/19/2018 05:33   US Biopsy (liver)  Result Date: 12/20/2018 INDICATION: History of left breast carcinoma with imaging evidence of probable diffuse metastatic disease in the liver, more prominently in the left lobe. The patient presents for biopsy. EXAM: ULTRASOUND GUIDED CORE BIOPSY OF LIVER MEDICATIONS: None. ANESTHESIA/SEDATION: Fentanyl 100 mcg IV; Versed 2.0 mg IV Moderate Sedation Time:  20 minutes. The patient was continuously monitored during the procedure by the interventional radiology nurse under my direct supervision. PROCEDURE: The procedure, risks, benefits, and alternatives were explained to the patient. Questions regarding the procedure were encouraged and answered. The patient understands and consents to the procedure. A time-out was performed prior to initiating the procedure. Ultrasound was used to localize lesions within the liver. The anterior abdominal wall was prepped with chlorhexidine in a  sterile fashion, and a sterile drape was applied covering the operative field. A sterile gown and sterile gloves were used for the procedure. Local anesthesia was provided with 1% Lidocaine. Under direct ultrasound guidance, a 17 gauge trocar needle was advanced into the left lobe of the liver. After confirming needle tip position, coaxial 18 gauge core biopsy samples were obtained. A total of 3 samples were obtained and submitted in formalin. A slurry of Gel-Foam pledgets was then slowly injected via the outer needle as the needle was retracted. Additional ultrasound was performed. COMPLICATIONS: None immediate. FINDINGS: Ultrasound demonstrates heterogeneous appearance of the liver with ill-defined lesions throughout the left lobe. Solid core biopsy samples were obtained by sampling lesions within the lateral segment of the left lobe. IMPRESSION: Ultrasound-guided core biopsy performed of hepatic lesions within the left lobe. Electronically Signed   By: Aletta Edouard M.D.   On: 12/20/2018 13:44   Dg Chest Portable 1 View  Result Date: 12/21/2018 CLINICAL  DATA:  Pleuritic right chest pain EXAM: PORTABLE CHEST 1 VIEW COMPARISON:  12/18/2018 chest radiograph. FINDINGS: Stable right subclavian Port-A-Cath terminating over the right atrium. Stable cardiomediastinal silhouette with normal heart size. No pneumothorax. No pleural effusion. No pulmonary edema. Curvilinear left lung base opacities. No acute consolidative airspace disease. IMPRESSION: Curvilinear left lung base opacities, favor scarring or atelectasis. No acute consolidative airspace disease. Electronically Signed   By: Ilona Sorrel M.D.   On: 12/21/2018 13:06   Dg Chest Portable 1 View  Result Date: 12/18/2018 CLINICAL DATA:  Fever abdominal pain EXAM: PORTABLE CHEST 1 VIEW COMPARISON:  12/31/2017 FINDINGS: Right-sided central venous port with tip over the cavoatrial region. No focal airspace disease or effusion. Normal heart size. No  pneumothorax. IMPRESSION: No active disease. Electronically Signed   By: Donavan Foil M.D.   On: 12/18/2018 19:49   [place] Ascites (abdomen Limited)  Result Date: 12/30/2018 INDICATION: Ascites. EXAM: ULTRASOUND GUIDED PARACENTESIS MEDICATIONS: None. COMPLICATIONS: None immediate. PROCEDURE: Informed written consent was obtained from the patient after a discussion of the risks, benefits and alternatives to treatment. A timeout was performed prior to the initiation of the procedure. Ultrasound reveals a mild amount of ascites. Given the mild amount of ascites and given patient's low platelet count, it was elected to not perform paracentesis at this time. FINDINGS: Mild amount of ascites. Given patient's low platelet count it was not deemed safe to perform paracentesis at this time. Follow-up exam can be obtained if symptoms worsens. IMPRESSION: Mild amount of ascites. Given patient's low platelet count it was not deemed safe to perform paracentesis this time. Electronically Signed   By: Marcello Moores  Register   On: 12/30/2018 14:37    PERFORMANCE STATUS (ECOG) : 2 - Symptomatic, <50% confined to bed  Review of Systems Unless otherwise noted, a complete review of systems is negative.  Physical Exam General: NAD, frail appearing, thin Pulmonary: unlabored, on O2 Extremities: no edema, no joint deformities Skin: no rashes Neurological: Weakness but otherwise nonfocal  IMPRESSION: Follow up visit today.   Patient is thrombocytopenic.  She is pending dose of chemotherapy later today.  Symptomatically, patient denies any distressing symptoms at present.  She verbalized feeling afraid for the future.  Emotional support provided.  We discussed future decision making.  She plans to have her attorney come to the hospital later today to complete financial and living wills and HCPOA documents.   Case discussed with Dr. Theodoro Doing.   PLAN: -Continue current scope of treatment -Will follow   Time Total:  20 minutes  Visit consisted of counseling and education dealing with the complex and emotionally intense issues of symptom management and palliative care in the setting of serious and potentially life-threatening illness.Greater than 50%  of this time was spent counseling and coordinating care related to the above assessment and plan.  Signed by: Altha Harm, PhD, NP-C

## 2019-01-03 ENCOUNTER — Encounter: Payer: Self-pay | Admitting: Oncology

## 2019-01-03 ENCOUNTER — Inpatient Hospital Stay: Payer: No Typology Code available for payment source

## 2019-01-03 DIAGNOSIS — Z515 Encounter for palliative care: Secondary | ICD-10-CM | POA: Diagnosis not present

## 2019-01-03 DIAGNOSIS — K7689 Other specified diseases of liver: Secondary | ICD-10-CM

## 2019-01-03 DIAGNOSIS — C50919 Malignant neoplasm of unspecified site of unspecified female breast: Secondary | ICD-10-CM | POA: Diagnosis not present

## 2019-01-03 DIAGNOSIS — R109 Unspecified abdominal pain: Secondary | ICD-10-CM | POA: Diagnosis not present

## 2019-01-03 DIAGNOSIS — R0602 Shortness of breath: Secondary | ICD-10-CM | POA: Diagnosis not present

## 2019-01-03 DIAGNOSIS — D696 Thrombocytopenia, unspecified: Secondary | ICD-10-CM | POA: Diagnosis not present

## 2019-01-03 LAB — CULTURE, BLOOD (ROUTINE X 2)
Culture: NO GROWTH
Culture: NO GROWTH
Special Requests: ADEQUATE
Special Requests: ADEQUATE

## 2019-01-03 LAB — RESPIRATORY PANEL BY PCR

## 2019-01-03 LAB — LACTATE DEHYDROGENASE: LDH: 4348 U/L — ABNORMAL HIGH (ref 98–192)

## 2019-01-03 LAB — MRSA PCR SCREENING: MRSA by PCR: NEGATIVE

## 2019-01-03 LAB — BASIC METABOLIC PANEL
Anion gap: 12 (ref 5–15)
BUN: 26 mg/dL — ABNORMAL HIGH (ref 6–20)
CO2: 27 mmol/L (ref 22–32)
Calcium: 8.6 mg/dL — ABNORMAL LOW (ref 8.9–10.3)
Chloride: 93 mmol/L — ABNORMAL LOW (ref 98–111)
Creatinine, Ser: 0.49 mg/dL (ref 0.44–1.00)
GFR calc Af Amer: >60 mL/min (ref >60)
GFR calc non Af Amer: >60 mL/min (ref >60)
Glucose, Bld: 139 mg/dL — ABNORMAL HIGH (ref 70–99)
Potassium: 4.7 mmol/L (ref 3.5–5.1)
Sodium: 132 mmol/L — ABNORMAL LOW (ref 135–145)

## 2019-01-03 LAB — CBC
HCT: 34.9 % — ABNORMAL LOW (ref 36.0–46.0)
Hemoglobin: 12.4 g/dL (ref 12.0–15.0)
MCH: 32.3 pg (ref 26.0–34.0)
MCHC: 35.5 g/dL (ref 30.0–36.0)
MCV: 90.9 fL (ref 80.0–100.0)
Platelets: 20 10*3/uL — CL (ref 150–400)
RBC: 3.84 MIL/uL — ABNORMAL LOW (ref 3.87–5.11)
RDW: 17.4 % — ABNORMAL HIGH (ref 11.5–15.5)
WBC: 20.7 10*3/uL — ABNORMAL HIGH (ref 4.0–10.5)
nRBC: 0 % (ref 0.0–0.2)

## 2019-01-03 LAB — GLUCOSE, CAPILLARY
Glucose-Capillary: 143 mg/dL — ABNORMAL HIGH (ref 70–99)
Glucose-Capillary: 152 mg/dL — ABNORMAL HIGH (ref 70–99)
Glucose-Capillary: 153 mg/dL — ABNORMAL HIGH (ref 70–99)
Glucose-Capillary: 161 mg/dL — ABNORMAL HIGH (ref 70–99)

## 2019-01-03 LAB — FERRITIN: Ferritin: 7170 ng/mL — ABNORMAL HIGH (ref 11–307)

## 2019-01-03 LAB — URIC ACID: Uric Acid, Serum: 5.7 mg/dL (ref 2.5–7.1)

## 2019-01-03 LAB — ABO/RH: ABO/RH(D): A POS

## 2019-01-03 LAB — SAR COV2 SEROLOGY (COVID19)AB(IGG),IA: SARS-CoV-2 Ab, IgG: NONREACTIVE

## 2019-01-03 LAB — MAGNESIUM: Magnesium: 2 mg/dL (ref 1.7–2.4)

## 2019-01-03 LAB — PROCALCITONIN: Procalcitonin: 1.54 ng/mL

## 2019-01-03 MED ORDER — SODIUM CHLORIDE 0.9% IV SOLUTION: Freq: Once | INTRAVENOUS | Status: DC

## 2019-01-03 MED ORDER — ALPRAZOLAM 0.25 MG PO TABS
0.2500 mg | ORAL_TABLET | Freq: Three times a day (TID) | ORAL | Status: DC | PRN
  Administered 2019-01-03: 0.5 mg via ORAL
  Filled 2019-01-03: qty 2

## 2019-01-03 MED ORDER — SODIUM CHLORIDE 0.9 % IV SOLN
Freq: Once | INTRAVENOUS | Status: AC
  Administered 2019-01-03: 17:00:00 via INTRAVENOUS

## 2019-01-03 MED ORDER — FUROSEMIDE 10 MG/ML IJ SOLN
40.0000 mg | Freq: Every day | INTRAMUSCULAR | Status: DC
  Administered 2019-01-03: 40 mg via INTRAVENOUS
  Filled 2019-01-03: qty 4

## 2019-01-03 NOTE — Progress Notes (Signed)
See, progress note. Discussed with Dr. Loleta Books on patient's condition. Patient 02 sats 93% on HFNC currently, MEWS initially at 3, up to 6 for systolic BP at 89. Transferring patient to step down.

## 2019-01-03 NOTE — Progress Notes (Signed)
PROGRESS NOTE    Shelby [name]  GEZ:[id number] DOB: February 24, 1970 DOA: 12/08/2018 PCP: Jerrol Banana., MD      Brief Narrative:  Shelby [name] is a 48 y.o. F with metastatic triple negative breast cancer, HTN who presents with abdominal pain, distention and nausea.  Patient was admitted here few days ago and discharged on 11/27.After going home she continued report of mid abdominal bloating, fullness and pain with poor appetite. This continued to progress therefore came back to the hospital may be subjective chills but denies any fevers and other complaints.  During her prior admission she was having some fever and it was thought to be secondary to tumor burden and she was started on steroids.  During this readmission she was having leukocytosis, lactic acidosis with abdominal pain and ascites.  CT abdomen with widespread metastatic disease to liver.  Due to her worsening pain it was thought to be due to SBP and she was started on Zosyn.  Urine culture grew 10,000 colonies of Klebsiella with ESBL positive.          Assessment & Plan:  Stage IV BrCa triple negative Patient diagnosed with IIb BrCa in Dec 2019, neo-adj therapy followed by primary resection in June 2020.  Unfortunately, she was admitted in mid Nov 2020 with abdominal pain, found to have metastases to liver, confirmed by biopsy.  Received Eribulin on 12/3, WBC up, Plts down to 20K today.  No bleeding.  Hgb stable. -Consult [place], appreicate cares    Abdominal pain due to bulky liver metastasis Moderate malignant ascites SBP and sepsis are doubted. Paracentesis was attempted for diagnostic purposes early in hospital stay, but not enough fluid, and platelets too low to attempt to safely.    Urine culture growing Klebsiella oxytocin, ESBL.  ID consulted, suspect this is cystitis.  Blood cultures no growth.  -Continue ciprofloxacin day 3 of 3 -Consult ID, appreciate expert guidance  -Continue  decadron -Attn for PJP ppx if continued steroids at this dose  -Consult Palliative Care, Oncology    Acute hypoxic respiratory failure Patient had had an O2 requirement at discharge from last hospitalization, this was thought to be atelectasis combined with mass effect from liver.    Since admission, has had increasing O2 requirements, now on 5L, and desaturates to 60% off O2.  This is out of proportion to what would be expected with atelectasis or mild restriction from her enlarged liver.  I favor carcinomatosis.  The question of intrapulmonary shunting has been raised.  No wheezing to suggest spasm.  No infection.    Repeat CXR personally reviewed, shows bilateral opacities and small effusions. -Consult Pulm -Pulmonology recommend trial Lasix  Sinus tachycardia This is slowly worsening today.  ECG reviewed, no ischemic changes.  We know her CTA showed no PE two days ago, I do not suspect it.  I suspect this worsened today due to the [facility] yesterday. -Monitor on telemetry  Anemia of chronic disease Thrombocytopenia related to chemo Platelets slightly down again to 20K today.  She is having some mucosal bleeding in urine -Trend CBC -Platelet transfusion for mucosal bleeding  Hypertension BP stable -Continue losartan  Anxiety -Continue Xanax as needed         MDM and disposition: The below labs and imaging reports reviewed and summarized above.  Medication management as above.   The patient was admitted with malignant acites, uncontrolled pain, and possible sepsis from SBP.  Now with worsening acute hypoxic respiratory failure, which is severe, (  desats to 60% with minimal exertion), unclear cause.  Also with critically low platelets and mucosal bleeding and tachycardia to 140s.    Her prognosis is worsneing, and I am concerned about an in hospital death.  Palliative are involved.         DVT prophylaxis: SCDs Code Status: FULL Family Communication:      Consultants:   Oncology  ID  Palliative care  Pulmonology  Procedures:   11/29 CT abdomen wo contrast -- bulky tumor burden  11/30 US abdomen -- failed to tap ascites  12/1 CTA chest -- no PE, mild scattered GGOs  Antimicrobials:    Zosyn 11/29 >> 12/1  Flagyl and Cefepime x1 on 11/29  Ceftriaxone x1 on 12/1  Cipro 12/2 >>   Subjective: Abdominal pain reosolved but very dyspneic with any exertion.  No chest pain.  Mild ankle swelling.  No orthopnea or confusion.        Objective: Vitals:   01/03/19 1605 01/03/19 1610 01/03/19 1746 01/03/19 1813  BP:    103/80  Pulse: (!) 145 (!) 140 (!) 139 (!) 138  Resp: 17  17 17   Temp:   98.1 F (36.7 C) 98 F (36.7 C)  TempSrc:   Axillary Axillary  SpO2: 91%   91%  Weight:      Height:        Intake/Output Summary (Last 24 hours) at 01/03/2019 1845 Last data filed at 01/03/2019 1758 Gross per 24 hour  Intake 0 ml  Output 700 ml  Net -700 ml   Filed Weights   12/07/2018 0855 12/31/18 0500 01/03/19 0645  Weight: 64.9 kg 63.2 kg 72.1 kg    Examination: General appearance:  adult female, alert and in mild distress.   HEENT: Anicteric, conjunctiva pink, lids and lashes normal. No nasal deformity, discharge, epistaxis.  Lips moist, teeth normal. OP moist, no oral lesions.   Skin: Warm and dry.  No suspicious rashes or lesions. Cardiac: RRR, no murmurs appreciated.  Trace LE edema.    Respiratory: Tachypneic, on O2 tent, no rales, diminished bilaterally. Abdomen: Abdomen soft.  No TTP.  Liver edge very large, firm .   MSK: No deformities or effusions of the large joints of the upper or lower extremities bilaterally. Neuro: Awake and alert. Naming is grossly intact, and the patient's recall, recent and remote, as well as general fund of knowledge seem within normal limits.  Muscle tone normal, without fasciculations.  Moves all extremities equally and with normal coordination.  Marland Kitchen Speech fluent.    Psych: Sensorium  intact and responding to questions, attention normal. Affect anxious.  Judgment and insight appear normal.      Data Reviewed: I have personally reviewed following labs and imaging studies:  CBC: Recent Labs  Lab 12/30/18 0530 12/31/18 0514 01/01/19 0528 01/02/19 0534 01/03/19 0457  WBC 10.5 9.8 13.6* 15.2* 20.7*  HGB 12.1 12.2 12.6 11.8* 12.4  HCT 36.3 34.3* 35.8* 34.3* 34.9*  MCV 93.6 90.0 90.9 92.2 90.9  PLT 39* 34* 35* 26* 20*   Basic Metabolic Panel: Recent Labs  Lab 12/06/2018 0919 12/30/18 0530 12/31/18 0514 01/02/19 0534 01/03/19 0457 01/03/19 1107  NA 133* 131* 131* 132* 132*  --   K 4.2 4.6 4.8 4.8 4.7  --   CL 93* 93* 93* 93* 93*  --   CO2 25 25 27 29 27   --   GLUCOSE 121* 133* 147* 132* 139*  --   BUN 17 19 17  24* 26*  --  CREATININE 0.56 0.63 0.58 0.51 0.49  --   CALCIUM 8.7* 8.6* 8.5* 8.7* 8.6*  --   MG  --   --   --   --   --  2.0   GFR: Estimated Creatinine Clearance: 85.5 mL/min (by C-G formula based on SCr of 0.49 mg/dL). Liver Function Tests: Recent Labs  Lab 12/08/2018 0919 12/30/18 0530 12/31/18 0514 01/02/19 0534  AST 133* 147* 151* 179*  ALT 85* 88* 88* 108*  ALKPHOS 118 123 142* 186*  BILITOT 1.1 1.0 1.0 1.0  PROT 5.8* 5.8* 5.5* 5.6*  ALBUMIN 2.7* 2.7* 2.5* 2.7*   Recent Labs  Lab 12/20/2018 0919  LIPASE 20   No results for input(s): AMMONIA in the last 168 hours. Coagulation Profile: Recent Labs  Lab 12/30/18 0530  INR 1.2   Cardiac Enzymes: No results for input(s): CKTOTAL, CKMB, CKMBINDEX, TROPONINI in the last 168 hours. BNP (last 3 results) No results for input(s): PROBNP in the last 8760 hours. HbA1C: No results for input(s): HGBA1C in the last 72 hours. CBG: Recent Labs  Lab 01/02/19 1655 01/02/19 2215 01/03/19 0919 01/03/19 1155 01/03/19 1657  GLUCAP 126* 145* 143* 152* 153*   Lipid Profile: No results for input(s): CHOL, HDL, LDLCALC, TRIG, CHOLHDL, LDLDIRECT in the last 72 hours. Thyroid Function  Tests: No results for input(s): TSH, T4TOTAL, FREET4, T3FREE, THYROIDAB in the last 72 hours. Anemia Panel: Recent Labs    01/03/19 1318  FERRITIN 7,170*   Urine analysis:    Component Value Date/Time   COLORURINE YELLOW (A) 12/28/2018 0920   APPEARANCEUR CLEAR (A) 12/01/2018 0920   LABSPEC 1.015 12/15/2018 0920   PHURINE 6.0 12/27/2018 0920   GLUCOSEU NEGATIVE 12/21/2018 0920   HGBUR NEGATIVE 12/20/2018 0920   BILIRUBINUR NEGATIVE 12/02/2018 0920   BILIRUBINUR neg 05/16/2016 1537   KETONESUR NEGATIVE 12/21/2018 0920   PROTEINUR NEGATIVE 12/24/2018 0920   UROBILINOGEN 0.2 05/16/2016 1537   NITRITE NEGATIVE 12/08/2018 0920   LEUKOCYTESUR NEGATIVE 12/16/2018 0920   Sepsis Labs: @LABRCNTIP (procalcitonin:4,lacticacidven:4)  ) Recent Results (from the past 240 hour(s))  Urine culture     Status: Abnormal   Collection Time: 12/30/2018  9:20 AM   Specimen: Urine, Clean Catch  Result Value Ref Range Status   Specimen Description   Final    URINE, CLEAN CATCH Performed at [facility], [address] [place], [place] [address]    Special Requests   Final    NONE Performed at [facility], [address] [place], [place] [address]    Culture (A)  Final    10,000 COLONIES/mL KLEBSIELLA OXYTOCA Confirmed Extended Spectrum Beta-Lactamase Producer (ESBL).  In bloodstream infections from ESBL organisms, carbapenems are preferred over piperacillin/tazobactam. They are shown to have a lower risk of mortality.    Report Status 12/31/2018 FINAL  Final   Organism ID, Bacteria KLEBSIELLA OXYTOCA (A)  Final      Susceptibility   Klebsiella oxytoca - MIC*    AMPICILLIN >=32 RESISTANT Resistant     CEFAZOLIN >=64 RESISTANT Resistant     CEFTRIAXONE 8 SENSITIVE Sensitive     CIPROFLOXACIN <=0.25 SENSITIVE Sensitive     GENTAMICIN <=1 SENSITIVE Sensitive     IMIPENEM <=0.25 SENSITIVE Sensitive     NITROFURANTOIN 32 SENSITIVE Sensitive     TRIMETH/SULFA <=20  SENSITIVE Sensitive     AMPICILLIN/SULBACTAM >=32 RESISTANT Resistant     PIP/TAZO >=128 RESISTANT Resistant     Extended ESBL POSITIVE Resistant     *  10,000 COLONIES/mL KLEBSIELLA OXYTOCA  Blood culture (routine x 2)     Status: None   Collection Time: 12/28/2018 11:13 AM   Specimen: BLOOD  Result Value Ref Range Status   Specimen Description BLOOD RIGHT ANTECUBITAL  Final   Special Requests   Final    BOTTLES DRAWN AEROBIC AND ANAEROBIC Blood Culture adequate volume   Culture   Final    NO GROWTH 5 DAYS Performed at [facility], [address], [place], [place] [address]    Report Status 01/03/2019 FINAL  Final  Blood culture (routine x 2)     Status: None   Collection Time: 12/13/2018 11:13 AM   Specimen: BLOOD  Result Value Ref Range Status   Specimen Description BLOOD LEFT ANTECUBITAL  Final   Special Requests   Final    BOTTLES DRAWN AEROBIC AND ANAEROBIC Blood Culture adequate volume   Culture   Final    NO GROWTH 5 DAYS Performed at [facility], [address] [place], [place] [address]    Report Status 01/03/2019 FINAL  Final  SARS CORONAVIRUS 2 (TAT 6-24 HRS) Nasopharyngeal Nasopharyngeal Swab     Status: None   Collection Time: 12/11/2018  1:19 PM   Specimen: Nasopharyngeal Swab  Result Value Ref Range Status   SARS Coronavirus 2 NEGATIVE NEGATIVE Final    Comment: (NOTE) SARS-CoV-2 target nucleic acids are NOT DETECTED. The SARS-CoV-2 RNA is generally detectable in upper and lower respiratory specimens during the acute phase of infection. Negative results do not preclude SARS-CoV-2 infection, do not rule out co-infections with other pathogens, and should not be used as the sole basis for treatment or other patient management decisions. Negative results must be combined with clinical observations, patient history, and epidemiological information. The expected result is Negative. Fact Sheet for Patients:  SugarRoll.be Fact Sheet for Healthcare Providers: https://www.woods-mathews.com/ This test is not yet approved or cleared by the [place] FDA and  has been authorized for detection and/or diagnosis of SARS-CoV-2 by FDA under an Emergency Use Authorization (EUA). This EUA will remain  in effect (meaning this test can be used) for the duration of the COVID-19 declaration under Section 56 4(b)(1) of the Act, 21 U.S.C. section 360bbb-3(b)(1), unless the authorization is terminated or revoked sooner. Performed at [facility], [place] [address] [place], [place] [address]          Radiology Studies: Dg Chest 2 View  Result Date: 01/03/2019 CLINICAL DATA:  Hypoxia today. EXAM: CHEST - 2 VIEW COMPARISON:  12/23/2018 FINDINGS: Right subclavian Port-A-Cath unchanged. Lungs are adequately inflated a demonstrate mild patchy opacification over the anterior right middle lobe. Small amount of posterior right pleural fluid. Cardiomediastinal silhouette and remainder of the exam is unchanged. IMPRESSION: Patchy airspace opacification over the right middle lobe likely infection. Small amount right pleural fluid. Electronically Signed   By: Marin Olp M.D.   On: 01/03/2019 08:25        Scheduled Meds: . Chlorhexidine Gluconate Cloth  6 each Topical Daily  . cholecalciferol  2,000 Units Oral QPM  . ciprofloxacin  500 mg Oral BID  . dexamethasone  8 mg Oral Q8H  . feeding supplement (ENSURE ENLIVE)  237 mL Oral BID BM  . fentaNYL  1 patch Transdermal Q72H  . furosemide  40 mg Intravenous Daily  . insulin aspart  0-9 Units Subcutaneous TID WC  . losartan  50 mg Oral Daily  . multivitamin with minerals  1 tablet Oral Daily  .  omega-3 acid ethyl esters  1,000 mg Oral QPM  . pantoprazole  40 mg Oral Daily  . polyethylene glycol  17 g Oral Daily  . sodium chloride flush  10 mL Intravenous q morning - 10a  . sucralfate  1 g Oral TID WC & HS    Continuous Infusions: . sodium chloride Stopped (01/02/19 1128)     LOS: 5 days    Time spent: 35 minutes     Edwin Dada, MD [facility] 01/03/2019, 6:45 PM     Please page though [place] or Epic secure chat:  For [employer], Adult nurse

## 2019-01-03 NOTE — Progress Notes (Signed)
Discussed patient's status with Dr. Loleta Books, advised to not administer beta blockers for tachycardia. Patient's MEWS score is at 3 as of now, 02 sats 87% on face tent. See VS flowsheet. Notified respiratory, high flow nasal cannula placed. 02 sats at 93% on HFNC. Discussed patient's condition and overall appearance with Jeannette Corpus NP, received order to transfer patient to step down.

## 2019-01-03 NOTE — Progress Notes (Signed)
ID Pt continues to be sob  CBC Latest Ref Rng & Units 01/03/2019 01/02/2019 01/01/2019  WBC 4.0 - 10.5 K/uL 20.7(H) 15.2(H) 13.6(H)  Hemoglobin 12.0 - 15.0 g/dL 12.4 11.8(L) 12.6  Hematocrit 36.0 - 46.0 % 34.9(L) 34.3(L) 35.8(L)  Platelets 150 - 400 K/uL 20(LL) 26(LL) 35(L)   CMP Latest Ref Rng & Units 01/03/2019 01/02/2019 12/31/2018  Glucose 70 - 99 mg/dL 139(H) 132(H) 147(H)  BUN 6 - 20 mg/dL 26(H) 24(H) 17  Creatinine 0.44 - 1.00 mg/dL 0.49 0.51 0.58  Sodium 135 - 145 mmol/L 132(L) 132(L) 131(L)  Potassium 3.5 - 5.1 mmol/L 4.7 4.8 4.8  Chloride 98 - 111 mmol/L 93(L) 93(L) 93(L)  CO2 22 - 32 mmol/L 27 29 27   Calcium 8.9 - 10.3 mg/dL 8.6(L) 8.7(L) 8.5(L)  Total Protein 6.5 - 8.1 g/dL - 5.6(L) 5.5(L)  Total Bilirubin 0.3 - 1.2 mg/dL - 1.0 1.0  Alkaline Phos 38 - 126 U/L - 186(H) 142(H)  AST 15 - 41 U/L - 179(H) 151(H)  ALT 0 - 44 U/L - 108(H) 88(H)   Impression/recommendation ESBL klebsiella in the urine- only 10K colonies-  On cipro  Extensive liver mets with portal vein involvement- she is presenting like BUDD chiari with pain, acutely enlarged liver and ascitis and collaterals  Thrombocytopenia due to the extensive malignacy look for PHT and splenomegaly  Acute hypoxic resp failure- No PE, has b/l lower lobe atelectasis- incentive spirometry ?? lymphangitis carcinmatosis ?? hepatopulmonary syndrome causing  Intrapulmonary vascular dilatation and shunting  Seen by pulmonary   Stage IV Triple negative breast cancer with liver mets. Received Erubulin  Leucocytosis- will check blood culture, fungitell- Can also be due to tumor necrosis  will continue cipro beyond 3 days   Fever- has resolved with decadron - thought to be due to tumor burden- on 24 mg decadron a day.  ID will follow her peripherally this weekend- call if needed

## 2019-01-03 NOTE — Progress Notes (Signed)
[place]  [phone] [phone]   Name: Shelby [name] Date: 01/03/2019 MRN: [id number]  DOB: 1970-02-26  Patient Care Team: Jerrol Banana., MD as PCP - General (Family Medicine) [name], MD as Consulting Physician (Cardiology) Alfonzo Feller, RN as [place] [facility]    REASON FOR CONSULTATION: Palliative Care consult requested for this 48 y.o. female with multiple medical problems including progressive stage IV triple negative breast cancer who was recently hospitalized 12/18/2018-12/20/2018 with abdominal pain and fevers.  Imaging revealed probable hepatic metastases and patient underwent biopsy on 12/20/2018 with findings consistent with metastatic breast cancer.  Patient was readmitted 12/21/2018-12/27/2018 again with acute right upper quadrant pain and fevers.  Fevers were thought to be from occult infection vs tumor burden. Patient is readmitted 12/15/2018 with abdominal pain. Palliative care was consulted to help address goals and manage ongoing symptoms.  CODE STATUS: Full code  PAST MEDICAL HISTORY: Past Medical History:  Diagnosis Date   Anemia    h/o with pregnancy   Anxiety    Asthma    allergy induced-no inhaler   Cancer ([place])    breast left    Complication of anesthesia    Environmental allergies    Family history of adverse reaction to anesthesia    son-breathing problems-coded 1st time when he was 2 and had another surgery at 88 and had to be admitted for breathing problems   Family history of breast cancer    GERD (gastroesophageal reflux disease)    Headache    migraines   Hypertension    Mitral valve disease    Mitral valve prolapse    Painful menstrual periods    PONV (postoperative nausea and vomiting)    Vertigo     PAST SURGICAL HISTORY:  Past Surgical History:  Procedure Laterality Date   ABDOMINAL HYSTERECTOMY   2010   supracervical    AXILLARY LYMPH NODE DISSECTION Left 07/12/2018   Procedure: AXILLARY LYMPH NODE DISSECTION;  Surgeon: Robert Bellow, MD;  Location: [facility] ORS;  Service: General;  Laterality: Left;   BREAST BIOPSY Left 11/2017   invasive ductal carcinoma and metastatic LN   BREAST BIOPSY WITH SENTINEL LYMPH NODE BIOPSY AND NEEDLE LOCALIZATION Left 07/12/2018   Procedure: BREAST BIOPSY WIDE EXCISION WITH SENTINEL NODE  AND NEEDLE LOCALIZATION OF OXILLARY NODS LEFT;  Surgeon: Robert Bellow, MD;  Location: [facility] ORS;  Service: General;  Laterality: Left;   BUNIONECTOMY Bilateral    MANDIBLE RECONSTRUCTION     age 26-underbite   PORTACATH PLACEMENT Right 12/31/2017   Procedure: INSERTION PORT-A-CATH;  Surgeon: Robert Bellow, MD;  Location: [facility] ORS;  Service: General;  Laterality: Right;   RE-EXCISION OF BREAST LUMPECTOMY Left 07/24/2018   Procedure: RE-EXCISION OF BREAST LUMPECTOMY LEFT;  Surgeon: Robert Bellow, MD;  Location: [facility] ORS;  Service: General;  Laterality: Left;   SHOULDER ARTHROSCOPY WITH OPEN ROTATOR CUFF REPAIR Right 04/26/2017   Procedure: SHOULDER ARTHROSCOPY WITH OPEN ROTATOR CUFF REPAIR,SUBACROMINAL DECOMPRESSION;  Surgeon: [place], MD;  Location: [facility] ORS;  Service: Orthopedics;  Laterality: Right;   TONSILLECTOMY      HEMATOLOGY/ONCOLOGY HISTORY:  Oncology History Overview Note  Initially seen by nurse midwife Melody McConnells at gynecologist office for left tender breast mass.  Had breast ultrasound and and mammogram revealing suspicious palpable mass of left breast requiring biopsy.  Biopsy revealed grade 3 invasive ductal carcinoma.  Positive metastatic carcinoma and left axillary  lymph node.  Met with Dr. Grayland Ormond on 12/24/2017 where he recommended neoadjuvant chemotherapy using Adriamycin, Cytoxan with Neulasta support followed by carbo/Taxol.  XRT may not be needed given patient would like mastectomy of left breast.   Had port  placed by Dr. Bary Castilla on 12/31/2017.  CT abdomen/pelvis/chest with contrast on 01/02/2018 did not reveal metastatic disease.  Pretreatment MUGA revealed an EF of 71%.    Stage IV breast cancer in female [name]  12/23/2017 Initial Diagnosis   Primary cancer of lower-inner quadrant of left female breast ([place])   12/24/2017 Cancer Staging   Staging form: Breast, AJCC 8th Edition - Clinical stage from 12/24/2017: Stage IIIB (cT2, cN1, cM0, G3, ER-, PR-, HER2-) - Signed by Lloyd Huger, MD on 12/24/2017   01/03/2018 - 06/26/2018 Chemotherapy   The patient had DOXOrubicin (ADRIAMYCIN) chemo injection 104 mg, 60 mg/m2 = 104 mg, Intravenous,  Once, 4 of 4 cycles Administration: 104 mg (01/03/2018), 104 mg (01/17/2018), 104 mg (01/31/2018), 104 mg (02/14/2018) palonosetron (ALOXI) injection 0.25 mg, 0.25 mg, Intravenous,  Once, 8 of 8 cycles Administration: 0.25 mg (01/03/2018), 0.25 mg (02/28/2018), 0.25 mg (01/17/2018), 0.25 mg (03/21/2018), 0.25 mg (01/31/2018), 0.25 mg (02/14/2018), 0.25 mg (05/02/2018), 0.25 mg (05/30/2018) pegfilgrastim-cbqv (UDENYCA) injection 6 mg, 6 mg, Subcutaneous, Once, 6 of 6 cycles Administration: 6 mg (01/04/2018), 6 mg (01/18/2018), 6 mg (02/01/2018), 6 mg (02/15/2018), 6 mg (05/31/2018) CARBOplatin (PARAPLATIN) 680 mg in sodium chloride 0.9 % 250 mL chemo infusion, 680 mg (100 % of original dose 676.2 mg), Intravenous,  Once, 4 of 4 cycles Dose modification:   (original dose 676.2 mg, Cycle 5) Administration: 680 mg (02/28/2018), 680 mg (03/21/2018), 680 mg (05/02/2018), 680 mg (05/30/2018) cyclophosphamide (CYTOXAN) 1,000 mg in sodium chloride 0.9 % 250 mL chemo infusion, 1,040 mg, Intravenous,  Once, 4 of 4 cycles Administration: 1,000 mg (01/03/2018), 1,000 mg (01/17/2018), 1,000 mg (01/31/2018), 1,000 mg (02/14/2018) PACLitaxel (TAXOL) 138 mg in sodium chloride 0.9 % 250 mL chemo infusion (</= 68m/m2), 80 mg/m2 = 138 mg, Intravenous,  Once, 4 of 4 cycles Administration: 138 mg  (02/28/2018), 138 mg (03/07/2018), 138 mg (03/14/2018), 138 mg (03/21/2018), 138 mg (04/04/2018), 138 mg (04/18/2018), 138 mg (05/02/2018), 138 mg (05/16/2018), 138 mg (05/23/2018), 138 mg (05/30/2018), 138 mg (06/13/2018), 138 mg (06/20/2018) fosaprepitant (EMEND) 150 mg, dexamethasone (DECADRON) 12 mg in sodium chloride 0.9 % 145 mL IVPB, , Intravenous,  Once, 8 of 8 cycles Administration:  (01/03/2018),  (02/28/2018),  (01/17/2018),  (03/21/2018),  (01/31/2018),  (02/14/2018),  (05/02/2018),  (05/30/2018)  for chemotherapy treatment.    08/01/2018 Cancer Staging   Staging form: Breast, AJCC 8th Edition - Pathologic stage from 08/01/2018: No Stage Recommended (ypT1c, pN1a, cM0, ER-, PR-, HER2-) - Signed by FLloyd Huger MD on 08/01/2018   10/09/2018 - 11/24/2018 Chemotherapy   The patient had [No matching medication found in this treatment plan]  for chemotherapy treatment.    01/02/2019 -  Chemotherapy   The patient had eriBULin mesylate (HALAVEN) 1.9 mg in sodium chloride 0.9 % 100 mL chemo infusion, 1.1 mg/m2 = 1.9 mg (100 % of original dose 1.1 mg/m2), Intravenous,  Once, 1 of 4 cycles Dose modification: 1.1 mg/m2 (original dose 1.1 mg/m2, Cycle 1, Reason: Change in LFTs)  for chemotherapy treatment.      ALLERGIES:  is allergic to hydrocodone; iodine; peanut butter flavor; shellfish allergy; and vancomycin.  MEDICATIONS:  Current Facility-Administered Medications  Medication Dose Route Frequency Provider Last Rate Last Dose   0.9 %  sodium chloride infusion   Intravenous PRN Damita Lack, MD   Stopped at 01/02/19 1128   ALPRAZolam (XANAX) tablet 0.25-0.5 mg  0.25-0.5 mg Oral TID PRN Thayer Inabinet, Kirt Boys, NP       bisacodyl (DULCOLAX) EC tablet 5 mg  5 mg Oral Daily PRN Damita Lack, MD   5 mg at 12/31/18 0858   Chlorhexidine Gluconate Cloth 2 % PADS 6 each  6 each Topical Daily Damita Lack, MD   6 each at 01/02/19 0757   cholecalciferol (VITAMIN D) tablet 2,000 Units  2,000 Units Oral  QPM Amin, Ankit Chirag, MD   2,000 Units at 01/02/19 1733   ciprofloxacin (CIPRO) tablet 500 mg  500 mg Oral BID Tsosie Billing, MD   500 mg at 01/03/19 0923   dexamethasone (DECADRON) tablet 8 mg  8 mg Oral Q8H Amin, Ankit Chirag, MD   8 mg at 01/03/19 1314   famotidine (PEPCID) tablet 20 mg  20 mg Oral Daily PRN Amin, Ankit Chirag, MD   20 mg at 12/31/18 1158   feeding supplement (ENSURE ENLIVE) (ENSURE ENLIVE) liquid 237 mL  237 mL Oral BID BM Amin, Ankit Chirag, MD   237 mL at 12/31/18 1158   fentaNYL (DURAGESIC) 25 MCG/HR 1 patch  1 patch Transdermal Q72H Lloyd Huger, MD   1 patch at 01/01/19 1254   fentaNYL (SUBLIMAZE) injection 25 mcg  25 mcg Intravenous Q4H PRN Amin, Ankit Chirag, MD       furosemide (LASIX) injection 40 mg  40 mg Intravenous Daily Ottie Glazier, MD   40 mg at 01/03/19 1335   HYDROmorphone (DILAUDID) injection 1 mg  1 mg Intravenous Q1H PRN [name] [place], MD   1 mg at 12/23/2018 1600   HYDROmorphone (DILAUDID) tablet 1 mg  1 mg Oral Q4H PRN Amin, Ankit Chirag, MD       insulin aspart (novoLOG) injection 0-9 Units  0-9 Units Subcutaneous TID WC Amin, Ankit Chirag, MD   2 Units at 01/03/19 1313   losartan (COZAAR) tablet 50 mg  50 mg Oral Daily Amin, Ankit Chirag, MD   50 mg at 01/03/19 8250   multivitamin with minerals tablet 1 tablet  1 tablet Oral Daily Damita Lack, MD   1 tablet at 01/03/19 5397   omega-3 acid ethyl esters (LOVAZA) capsule 1,000 mg  1,000 mg Oral QPM Amin, Ankit Chirag, MD   1,000 mg at 01/02/19 1733   ondansetron (ZOFRAN) tablet 4 mg  4 mg Oral Q6H PRN Damita Lack, MD   4 mg at 01/01/19 0539   Or   ondansetron (ZOFRAN) injection 4 mg  4 mg Intravenous Q6H PRN Amin, Ankit Chirag, MD   4 mg at 01/03/19 1333   pantoprazole (PROTONIX) EC tablet 40 mg  40 mg Oral Daily Amin, Ankit Chirag, MD   40 mg at 01/03/19 0923   polyethylene glycol (MIRALAX / GLYCOLAX) packet 17 g  17 g Oral Daily Lorella Nimrod, MD   17 g  at 01/03/19 0923   simethicone (MYLICON) chewable tablet 80 mg  80 mg Oral QID PRN Gerald Dexter, RPH   80 mg at 01/01/19 1739   sodium chloride flush (NS) 0.9 % injection 10 mL  10 mL Intravenous q morning - 10a Amin, Ankit Chirag, MD   10 mL at 01/03/19 0930   sodium chloride flush (NS) 0.9 % injection 10 mL  10 mL Intravenous PRN Amin, Jeanella Flattery, MD   10  mL at 12/30/2018 1735   sucralfate (CARAFATE) tablet 1 g  1 g Oral TID WC & HS Amin, Ankit Chirag, MD   1 g at 01/03/19 1314   traMADol (ULTRAM) tablet 50 mg  50 mg Oral Q12H PRN Damita Lack, MD   50 mg at 01/03/19 0623    VITAL SIGNS: BP 124/88 (BP Location: Right Arm)    Pulse 99    Temp 97.8 F (36.6 C) (Oral)    Resp 17    Ht 5' 6"  (1.676 m)    Wt 158 lb 15.2 oz (72.1 kg)    SpO2 91%    BMI 25.66 kg/m  Filed Weights   12/26/2018 0855 12/31/18 0500 01/03/19 0645  Weight: 143 lb (64.9 kg) 139 lb 5.3 oz (63.2 kg) 158 lb 15.2 oz (72.1 kg)    Estimated body mass index is 25.66 kg/m as calculated from the following:   Height as of this encounter: 5' 6"  (1.676 m).   Weight as of this encounter: 158 lb 15.2 oz (72.1 kg).  LABS: CBC:    Component Value Date/Time   WBC 20.7 (H) 01/03/2019 0457   HGB 12.4 01/03/2019 0457   HGB 11.8 08/13/2017 1556   HCT 34.9 (L) 01/03/2019 0457   HCT 34.8 12/04/2018 1729   PLT 20 (LL) 01/03/2019 0457   PLT 198 08/13/2017 1556   MCV 90.9 01/03/2019 0457   MCV 90 08/13/2017 1556   MCV 92 11/25/2012 1542   NEUTROABS 3.0 12/23/2018 2102   NEUTROABS 8.8 (H) 08/13/2017 1556   NEUTROABS 4.2 11/25/2012 1542   LYMPHSABS 0.3 (L) 12/23/2018 2102   LYMPHSABS 0.4 (L) 08/13/2017 1556   LYMPHSABS 1.7 11/25/2012 1542   MONOABS 0.5 12/23/2018 2102   MONOABS 0.4 11/25/2012 1542   EOSABS 0.0 12/23/2018 2102   EOSABS 0.2 08/13/2017 1556   EOSABS 0.1 11/25/2012 1542   BASOSABS 0.0 12/23/2018 2102   BASOSABS 0.0 08/13/2017 1556   BASOSABS 0.1 11/25/2012 1542   Comprehensive Metabolic  Panel:    Component Value Date/Time   NA 132 (L) 01/03/2019 0457   NA 137 06/22/2017 1448   NA 137 11/25/2012 1542   K 4.7 01/03/2019 0457   K 3.7 11/25/2012 1542   CL 93 (L) 01/03/2019 0457   CL 103 11/25/2012 1542   CO2 27 01/03/2019 0457   CO2 28 11/25/2012 1542   BUN 26 (H) 01/03/2019 0457   BUN 17 06/22/2017 1448   BUN 15 11/25/2012 1542   CREATININE 0.49 01/03/2019 0457   CREATININE 0.73 11/25/2012 1542   GLUCOSE 139 (H) 01/03/2019 0457   GLUCOSE 103 (H) 11/25/2012 1542   CALCIUM 8.6 (L) 01/03/2019 0457   CALCIUM 9.3 11/25/2012 1542   AST 179 (H) 01/02/2019 0534   AST 18 11/25/2012 1542   ALT 108 (H) 01/02/2019 0534   ALT 17 11/25/2012 1542   ALKPHOS 186 (H) 01/02/2019 0534   ALKPHOS 60 11/25/2012 1542   BILITOT 1.0 01/02/2019 0534   BILITOT 0.4 06/22/2017 1448   BILITOT 0.3 11/25/2012 1542   PROT 5.6 (L) 01/02/2019 0534   PROT 7.2 06/22/2017 1448   PROT 7.4 11/25/2012 1542   ALBUMIN 2.7 (L) 01/02/2019 0534   ALBUMIN 5.0 06/22/2017 1448   ALBUMIN 4.0 11/25/2012 1542    RADIOGRAPHIC STUDIES: Ct Abdomen Pelvis Wo Contrast  Addendum Date: 12/06/2018   ADDENDUM REPORT: 12/09/2018 10:16 ADDENDUM: Urinary bladder wall mildly thickened. A degree of cystitis questioned. Electronically Signed   By: Lowella Grip  III M.D.   On: 12/07/2018 10:16   Result Date: 12/24/2018 CLINICAL DATA:  Abdominal pain with nausea and distention. Breast carcinoma with known liver metastases EXAM: CT ABDOMEN AND PELVIS WITHOUT CONTRAST TECHNIQUE: Multidetector CT imaging of the abdomen and pelvis was performed following the standard protocol without oral or IV contrast. COMPARISON:  December 21, 2018 FINDINGS: Lower chest: There is a right pleural effusion with atelectatic change in the right base. There is atelectatic change in the left base with rather minimal left pleural effusion. Small pericardial effusion noted. Hepatobiliary: There is widespread metastatic disease is seen throughout  the liver, better delineated on recent contrast-enhanced study. Gallbladder is largely collapsed. No biliary duct dilatation evident. Pancreas: No pancreatic mass or inflammatory focus. Spleen: No splenic lesions are evident. Adrenals/Urinary Tract: Adrenals appear unremarkable bilaterally. Kidneys bilaterally show no evident mass or hydronephrosis on either side. There is no evident renal or ureteral calculus on either side. Urinary bladder is midline. The urinary bladder wall thickness is mildly increased. Stomach/Bowel: There is moderate stool in the colon. There is no appreciable bowel wall or mesenteric thickening. No evident bowel obstruction. Terminal ileal region appears unremarkable. There is no free air or portal venous air. Vascular/Lymphatic: No abdominal aortic aneurysm. No arterial lesions seen on this noncontrast enhanced study. Multiple upper abdominal venous collaterals are again noted without change from recent study. A focal periceliac lymph node measuring 1.6 x 1.3 cm is stable. No new adenopathy evident compared to recent study. Reproductive: Uterus absent.  No pelvic mass evident. Other: There is moderate ascites, slightly increased compared to recent study. No abscess evident in the abdomen or pelvis. No periappendiceal region inflammation. Appendix not appreciable. Musculoskeletal: No lytic or destructive bone lesions are evident. A small sclerotic focus in the right sacrum may represent a small bone island. No intramuscular lesions are evident. IMPRESSION: 1.  Moderate ascites, slightly increased compared to recent study. 2.  Widespread hepatic metastatic disease. 3. No bowel obstruction. No abscess in the abdomen or pelvis. No periappendiceal region inflammation. 4.  Enlarged periceliac lymph node, suspected due to neoplasm. 5. Venous collaterals in the upper abdomen, primarily on the left, stable. Further assessment with respect to etiology not possible without intravenous contrast.  Appearance stable compared to recent contrast enhanced CT examination. 6.  Uterus absent. 7. Small pleural effusions with bibasilar atelectatic change. Small pericardial effusion. Electronically Signed: By: Lowella Grip III M.D. On: 12/10/2018 10:13   Ct Abdomen Pelvis Wo Contrast  Addendum Date: 12/18/2018   ADDENDUM REPORT: 12/18/2018 20:18 ADDENDUM: These results were called by telephone on 12/18/2018 at 8:18 pm to provider [facility] , who verbally acknowledged these results. Electronically Signed   By: Lovena Le M.D.   On: 12/18/2018 20:18   Result Date: 12/18/2018 CLINICAL DATA:  Abdominal pain and distension, undergoing chemotherapy for breast cancer, history of IBS and diverticulitis EXAM: CT ABDOMEN AND PELVIS WITHOUT CONTRAST TECHNIQUE: Multidetector CT imaging of the abdomen and pelvis was performed following the standard protocol without IV contrast. COMPARISON:  CT abdomen pelvis 01/01/2018 FINDINGS: Lower chest: The patchy areas of subpleural ground-glass opacity could reflect atelectasis or early infection. Cardiac size is normal. Trace pericardial effusion, similar to prior. Hepatobiliary: Ill-defined regions of hypoattenuation are seen the anterior left lobe liver. Slightly nodular hepatic surface contour which is new from comparison exam. These features are both incompletely assessed in the absence of contrast media. Gallbladder appears largely contracted at the time of exam. No biliary ductal dilatation. Pancreas: Unremarkable.  No pancreatic ductal dilatation or surrounding inflammatory changes. Spleen: Normal in size without focal abnormality. Adrenals/Urinary Tract: No suspicious adrenal lesions. No visible or contour deforming renal lesions. No urolithiasis or hydronephrosis mild circumferential bladder wall thickening. Stomach/Bowel: Distal esophagus, stomach and duodenal sweep are unremarkable. No small bowel wall thickening or dilatation. No evidence of obstruction. Appendix  is not clearly identified. No pericecal inflammation. There is some mild mural thickening of the splenic flexure and descending colon remaining portions of the colon are free of mural thickening or dilatation. Vascular/Lymphatic: Upper abdominal venous collateralization and splenorenal shunting is noted. No suspicious or enlarged lymph nodes in the included lymphatic chains. Reproductive: Uterus is surgically absent. No concerning adnexal lesions. Other: Small volume of low-attenuation ascites noted in the low abdomen. Small volume of perihepatic low-attenuation ascites is present as well. Musculoskeletal: Multilevel degenerative changes are present in the imaged portions of the spine. No acute osseous abnormality or suspicious osseous lesion. IMPRESSION: 1. Interval development of cirrhotic stigmata including a nodular liver surface contour and increasing upper abdominal venous collateralization. Small volume of ascites is noted as well. 2. Ill-defined regions of hypoattenuation are seen within the anterior left lobe liver, which are incompletely assessed in the absence of contrast media. Furthermore findings are worrisome in the setting of known malignancy and potential intrinsic liver disease. Consider further evaluation with MRI or ultrasound if patient is unable to tolerate CT contrast due to allergy. 3. Mild mural thickening of the splenic flexure and descending colon, could reflect change related to the additional cirrhotic findings/portal colopathy though should exclude a an acute colitis clinically. 4. Patchy areas of subpleural ground-glass opacity in the visualized lung bases could reflect atelectasis or early infection. 5. Mild circumferential bladder wall thickening, which may be related to underdistention. Correlate with urinalysis to exclude cystitis. 6. Unchanged trace pericardial effusion. Electronically Signed: By: Lovena Le M.D. On: 12/18/2018 20:07   Dg Chest 1 View  Result Date:  12/23/2018 CLINICAL DATA:  Cirrhosis, ascites, SIRS, history LEFT breast cancer, hypertension EXAM: CHEST  1 VIEW COMPARISON:  Portable exam 1016 hours compared to 12/21/2018 FINDINGS: RIGHT subclavian Port-A-Cath with tip projecting over superior RIGHT atrium, unchanged. Normal heart size, mediastinal contours, and pulmonary vascularity. Bibasilar atelectasis. Remaining lungs clear. No pleural effusion or pneumothorax. IMPRESSION: Bibasilar atelectasis. Electronically Signed   By: Lavonia Dana M.D.   On: 12/23/2018 10:58   Dg Chest 2 View  Result Date: 01/03/2019 CLINICAL DATA:  Hypoxia today. EXAM: CHEST - 2 VIEW COMPARISON:  12/23/2018 FINDINGS: Right subclavian Port-A-Cath unchanged. Lungs are adequately inflated a demonstrate mild patchy opacification over the anterior right middle lobe. Small amount of posterior right pleural fluid. Cardiomediastinal silhouette and remainder of the exam is unchanged. IMPRESSION: Patchy airspace opacification over the right middle lobe likely infection. Small amount right pleural fluid. Electronically Signed   By: Marin Olp M.D.   On: 01/03/2019 08:25   Ct Angio Chest Pe W Or Wo Contrast  Result Date: 12/31/2018 CLINICAL DATA:  Hypoxemia EXAM: CT ANGIOGRAPHY CHEST WITH CONTRAST TECHNIQUE: Multidetector CT imaging of the chest was performed using the standard protocol during bolus administration of intravenous contrast. Multiplanar CT image reconstructions and MIPs were obtained to evaluate the vascular anatomy. CONTRAST:  86m OMNIPAQUE IOHEXOL 350 MG/ML SOLN COMPARISON:  None. FINDINGS: Cardiovascular: No filling defects in the pulmonary arteries to suggest pulmonary emboli. Heart is normal size. Aorta is normal caliber. Small pericardial effusion. Mediastinum/Nodes: No mediastinal, hilar, or axillary adenopathy. Trachea and esophagus are  unremarkable. Thyroid unremarkable. Lungs/Pleura: Trace left effusion and small right effusion. Bilateral lower lobe airspace  opacities are noted, right greater than left which could reflect atelectasis or pneumonia. There are scattered predominantly peripheral ground-glass opacities in the upper lobes which could reflect atypical/viral infection. Upper Abdomen: While not imaged in its entirety, the liver appears prominent. Recommend clinical correlation for possible hepatomegaly. Small amount of ascites surrounding the liver and spleen. Musculoskeletal: No acute bony abnormality. Chest wall soft tissues are unremarkable. Right chest wall Port-A-Cath in place with the tip in the SVC. Review of the MIP images confirms the above findings. IMPRESSION: No evidence of pulmonary embolus. Small right effusion and trace left effusion. Small pericardial effusion. Bilateral lower lobe atelectasis or infiltrate/pneumonia. Patchy peripheral ground-glass opacities in the upper lobes could reflect atypical/viral pneumonia. COVID pneumonia cannot be excluded. Liver appears prominent, but is not imaged in its entirety. Recommend clinical correlation for possible hepatomegaly. Perihepatic and perisplenic ascites. Electronically Signed   By: Rolm Baptise M.D.   On: 12/31/2018 19:30   Ct Angio Chest Pe W And/or Wo Contrast  Result Date: 12/21/2018 CLINICAL DATA:  48 y.o. female discharged yesterday for concerns of abdominal pain with a work-up regarding her liver as well as she is meeting SIRS criteria with feverPatient comes back today reports she continues to have significant pain and discomfort. She has been having ongoing and severe pain located in the mid to right upper abdomen, also she reports fairly significant pain when she takes a deep breath over the right lower lungShe has been feeling warm had a fever when she was in the hospital yesterday. Some nausea. No headaches. Tested negative for Covid a few days ago pain is located primarily in the right upper abdomen under the right rib cage. EXAM: CT ANGIOGRAPHY CHEST CT ABDOMEN AND PELVIS WITH  CONTRAST TECHNIQUE: Multidetector CT imaging of the chest was performed using the standard protocol during bolus administration of intravenous contrast. Multiplanar CT image reconstructions and MIPs were obtained to evaluate the vascular anatomy. Multidetector CT imaging of the abdomen and pelvis was performed using the standard protocol during bolus administration of intravenous contrast. CONTRAST:  52m OMNIPAQUE IOHEXOL 350 MG/ML SOLN COMPARISON:  Chest CTA and abdomen and pelvis CT dated 12/19/2018. FINDINGS: CTA CHEST FINDINGS Cardiovascular: There is satisfactory opacification of the pulmonary arteries to the segmental level. No evidence of a pulmonary embolism. Mediastinum/Nodes: No enlarged mediastinal, hilar, or axillary lymph nodes. Thyroid gland, trachea, and esophagus demonstrate no significant findings. Lungs/Pleura: Small bilateral pleural effusions. There is dependent opacity in the lower lobes consistent with atelectasis. Small areas of ground-glass opacity and small lung nodules noted. Dominant nodule in the right upper lobe, image 46, series 4, 6 mm. Several other small nodules are new from the prior CT. Area of ground-glass opacity adjacent to the oblique fissure in the left upper lobe is new. No pneumothorax. Musculoskeletal: No chest wall abnormality. No acute or significant osseous findings. Review of the MIP images confirms the above findings. CT ABDOMEN and PELVIS FINDINGS Hepatobiliary: Enlarged liver. There are multiple hypoattenuating areas throughout the liver. These areas are more apparent and more extensive than on the recent prior CT, particularly evident at the dome of segment 4A, along the anterior aspect of the right lobe and medial segment of the left lobe and along the inferior aspect of the right lobe, segment 5 and 6. Gallbladder is mostly collapsed. There is increased attenuation material within the gallbladder similar to the prior CT. No bile  duct dilation Pancreas:  Unremarkable. No pancreatic ductal dilatation or surrounding inflammatory changes. Spleen: Normal in size without focal abnormality. Adrenals/Urinary Tract: No adrenal masses. Right kidney displaced inferiorly by the enlarged liver. Symmetric renal enhancement and excretion. No renal masses, stones or hydronephrosis. Normal ureters. Bladder wall is prominent, unchanged from the recent prior study. No bladder masses. Stomach/Bowel: Stomach is unremarkable, mostly decompressed. Small bowel and colon are normal in caliber. No wall thickening or inflammation. Appendix not discretely seen. No findings of appendicitis. Vascular/Lymphatic: As noted on the prior CT, left portal vein is attenuated, but does show enhancement. No evidence of thrombosis. There are venous collaterals in the upper abdomen. Aorta is unremarkable. No enlarged lymph nodes. Prominent Peri celiac node noted on the prior study is stable. Reproductive: Status post hysterectomy. No adnexal masses. Other: Small amount of ascites increased when compared to the prior CT. Musculoskeletal: No acute or significant osseous findings. Review of the MIP images confirms the above findings. IMPRESSION: CHEST CTA 1. No evidence of a pulmonary embolism. 2. Small pleural effusions, new from the prior CT. There is increased opacity in the lower lobes that is consistent with atelectasis. There several new small nodules and small areas of ground-glass opacity when compared to the prior CT. Suspect these are inflammatory given change from the recent exam. ABDOMEN AND PELVIS CT 1. Multiple hypoattenuating liver lesions. These appear to have progressed when compared to the prior CT, although this change could be due to differences in contrast timing. The liver is enlarged, stable from the recent exam. Findings are concerning for multifocal hepatocellular carcinoma. Follow-up liver MRI without and with contrast, when the patient can tolerate the procedure, is recommended. 2.  Small amount ascites has increased in quantity from the prior study. 3. There are vascular collaterals in the upper abdomen, stable. Electronically Signed   By: Lajean Manes M.D.   On: 12/21/2018 18:36   Ct Angio Chest Pe W And/or Wo Contrast  Result Date: 12/19/2018 CLINICAL DATA:  Chest pain, complex, intermediate/high prob of ACS/PE/AAS; Abd distension Epigastric pain. Exertional dyspnea. Cough. Weakness. History of breast cancer. EXAM: CT ANGIOGRAPHY CHEST CT ABDOMEN AND PELVIS WITH CONTRAST TECHNIQUE: Multidetector CT imaging of the chest was performed using the standard protocol during bolus administration of intravenous contrast. Multiplanar CT image reconstructions and MIPs were obtained to evaluate the vascular anatomy. Multidetector CT imaging of the abdomen and pelvis was performed using the standard protocol during bolus administration of intravenous contrast. No reported contrast reaction. CONTRAST:  164m OMNIPAQUE IOHEXOL 350 MG/ML SOLN COMPARISON:  Abdominal CT without contrast yesterday. Chest CT 01/01/2018 FINDINGS: CTA CHEST FINDINGS Cardiovascular: There are no filling defects within the pulmonary arteries to suggest pulmonary embolus. Thoracic aorta is normal in caliber without dissection. Left vertebral artery arises directly from the thoracic aorta, variant arch anatomy. Heart is normal in size. No pericardial effusion. Right chest port in place with tip in the SVC. Mediastinum/Nodes: No mediastinal or hilar adenopathy. No visualized thyroid nodule. No esophageal wall thickening. Lungs/Pleura: Mild patchy areas of subpleural nodular and ground-glass opacities. Basilar findings are unchanged from CT yesterday. 5 mm right upper lobe pulmonary nodule series 6, image 41. Sub solid nodularity slightly more inferiorly in the right lung series 6, image 45 measuring 8 mm. Tiny 2 mm nodule in the right upper lung series 6, image 32, may be fissural. Pulmonary nodules are new from prior chest CT.  Perifissural in subpleural opacity in the right upper lobe, also series 6, image 32,  nonspecific. No pulmonary edema. No pleural fluid. Trachea and mainstem bronchi are patent. Musculoskeletal: There are no acute or suspicious osseous abnormalities. Review of the MIP images confirms the above findings. CT ABDOMEN and PELVIS FINDINGS Hepatobiliary: Hepatic parenchyma diffusely heterogeneous with nodular contours. Liver is increased in size from December 2019 CT. Gallbladder is decompressed and not well assessed, intraluminal high density may be stones or sludge. Questionable wall thickening may be related to nondistention. Pancreas: No ductal dilatation or inflammation. Spleen: Normal in size without focal abnormality. Adrenals/Urinary Tract: Normal adrenal glands. No hydronephrosis or perinephric edema. Homogeneous renal enhancement with symmetric excretion on delayed phase imaging. Urinary bladder is partially distended, mild bladder wall thickening. Stomach/Bowel: Lack of enteric contrast and paucity of intra-abdominal fat limits detailed bowel assessment. The stomach is nondistended. Questionable pre-pyloric gastric wall thickening. No small bowel obstruction or inflammatory change. Appendix not discretely visualized, no pericecal inflammation to suggest appendicitis. Transverse colon is tortuous. Mild colonic wall thickening at the splenic flexure, colon is decompressed, this is not well assessed. Questionable areas of descending colonic wall thickening, for example series 11, image 61. Vascular/Lymphatic: Right portal vein is patent. The left portal vein is attenuated without evidence of thrombus. Tortuous splenic vein with prominent collaterals in the left upper quadrant and left retroperitoneum. 9 mm perigastric lymph node series 11, image 29, nonspecific. No pelvic adenopathy. Reproductive: Uterus surgically absent. Left ovary tentatively visualized and normal. Right ovary not definitively seen. No obvious  adnexal mass. Other: Small volume of pelvic ascites measuring simple fluid density, similar to yesterday. Small perihepatic ascites anteriorly. No free air. Musculoskeletal: There are no acute or suspicious osseous abnormalities. Scattered bone islands in the pelvis. Review of the MIP images confirms the above findings. IMPRESSION: CT chest: 1. No pulmonary embolus. 2. Mild patchy areas of subpleural nodular and ground-glass opacities in both lungs. Slightly more confluent subpleural/perifissural right upper lobe opacity. Findings may be due to atelectasis or infection. 3. Small pulmonary nodules are new from December, infectious/inflammatory or metastatic. CT abdomen/pelvis: 1. Diffusely heterogeneous liver with nodular contours. Liver has increased in size since December 2019 CT. This may be due to underlying cirrhotic change versus diffuse metastatic disease. Recommend further evaluation with abdominal MRI. Left upper quadrant and retroperitoneal collaterals can be seen with portal hypertension. Left portal vein is attenuated, however no discrete thrombus. 2. Questionable pre-pyloric gastric wall thickening, peptic ulcer disease versus gastritis. 3. Bladder wall thickening, small volume abdominopelvic ascites, and areas of colonic wall thickening at the splenic flexure and descending colon are unchanged from CT yesterday. 4. Gallbladder is decompressed and not well assessed, intraluminal high density may be stones or sludge. Questionable gallbladder wall thickening may be related to nondistention or liver disease. This could be further evaluated with MRI or right upper quadrant ultrasound. Electronically Signed   By: Keith Rake M.D.   On: 12/19/2018 05:33   Mr Abdomen W Wo Contrast  Result Date: 12/19/2018 CLINICAL DATA:  Abdominal pain in a patient with breast cancer. EXAM: MRI ABDOMEN WITHOUT AND WITH CONTRAST TECHNIQUE: Multiplanar multisequence MR imaging of the abdomen was performed both before  and after the administration of intravenous contrast. CONTRAST:  51m GADAVIST GADOBUTROL 1 MMOL/ML IV SOLN COMPARISON:  CT of 12/19/2018 FINDINGS: Lower chest: Limited assessment of the lower chest on MRI is unremarkable. Hepatobiliary: Diffuse infiltration of much of the liver, nearly the entire left hepatic lobe, medial section and lateral section in addition to anterior right hepatic lobe with confluent infiltrative restricted diffusion  and heterogeneous enhancement measuring approximately 21 by 7.4 cm. Diffuse metastatic disease is strongly suspected. Boundaries are well-demarcated without adjacent edema in the bordering hepatic parenchyma Discrete hepatic lesions are also scattered about the right hepatic lobe largest in the dome of the right hemi liver measuring approximately 2.5 cm in greatest dimension. These discrete lesions display a targetoid appearance. There are numerous lesions in the right hepatic lobe, lesions are found in all hepatic subsegment. Pancreas: No mass, inflammatory changes, or other parenchymal abnormality identified. Mild ductal distension in the tail of the pancreas may be due to adjacent mass effect. No visible pancreatic lesion is noted. Spleen:  Within normal limits in size and appearance. Adrenals/Urinary Tract: No masses identified. No evidence of hydronephrosis. Stomach/Bowel: Limited assessment of the gastrointestinal tract without signs of acute process. Vascular/Lymphatic: Left-sided peri-renal venous collaterals of uncertain significance perhaps related to elevated portal pressures in the setting of diffuse hepatic involvement. 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 Right portal vein is patent. Scattered small lymph nodes in the upper abdomen. There is either slow flow or thrombus within the main portal vein at the level of the splenic portal confluence extending in the splenic vein. The SMV is patent. There is mass effect upon the vessel at the level of the splenic portal  confluence due to hepatic enlargement. This is best seen on subtraction images, image 54, series 20. Note that slow flow in an otherwise opacified vessel can cause artifact with a similar appearance however, this is seen on contrasted and non contrasted imaging with a similar appearance. Other:  None. Musculoskeletal: No signs of acute or destructive bone process. IMPRESSION: 1. Diffuse infiltration of much of the liver, nearly the entire left hepatic lobe, with confluent infiltrative restricted diffusion and heterogeneous enhancement measuring approximately 21 by 7.4 cm. Diffuse metastatic disease is strongly suspected with multifocal lesions in the right hepatic lobe. Superimposed infection is difficult to exclude though areas at the boundary of these lesions show no overt signs of edema. 2. Highly narrowed or occluded left portal vein with potential thrombus versus slow flow in the splenoportal confluence. This was patent on the exam of 12/19/2018; however, given above findings a venous phase CT or even ultrasound hepatic Doppler with special attention to the splenic portal confluence may be helpful to exclude this possibility, finding is also exhibited on TrueFISP sequence (image 24, series 10). 3. 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 4. Mild ductal distension in the tail of the pancreas, potentially due to mass effect. Attention on follow-up. 5. Critical Value/emergent results were called by telephone at the time of interpretation on 12/19/2018 at 6:43 pm to Ryder , who verbally acknowledged these results. Electronically Signed   By: Zetta Bills M.D.   On: 12/19/2018 18:24   US Abdomen Complete  Result Date: 12/19/2018 CLINICAL DATA:  Abdominal pain EXAM: ABDOMEN ULTRASOUND COMPLETE COMPARISON:  MRI earlier today FINDINGS: Gallbladder: Gallbladder is contracted with associated mild wall  thickening measuring up to 4 mm. Sludge noted within the gallbladder. No visible stones. Common bile duct: Diameter: Normal caliber, 3 mm Liver: Diffusely heterogeneous echotexture throughout the liver corresponding to the abnormality seen on today's MRI suspicious forinfiltrating mass. Main portal vein is patent on color Doppler imaging with normal direction of blood flow towards the liver. The area of possible thrombus seen on MRI at the level of the splenic portal confluence not visualized. IVC: No abnormality  visualized. Pancreas: Visualized portion unremarkable. Spleen: Size and appearance within normal limits. Right Kidney: Length: 11.0 cm. Echogenicity within normal limits. No mass or hydronephrosis visualized. Left Kidney: Length: 10.5 cm. Echogenicity within normal limits. No mass or hydronephrosis visualized. Abdominal aorta: No aneurysm visualized. Other findings: None. IMPRESSION: Heterogeneous appearance throughout much of the left hepatic lobe as seen on today's MRI concerning for infiltrating mass/tumor. Main portal vein is patent. Contracted gallbladder with gallbladder wall thickening likely related to contracted state. Sludge within the gallbladder. No sonographic Murphy sign or visible stones. Electronically Signed   By: Rolm Baptise M.D.   On: 12/19/2018 20:35   Ct Abdomen Pelvis W Contrast  Result Date: 12/21/2018 CLINICAL DATA:  48 y.o. female discharged yesterday for concerns of abdominal pain with a work-up regarding her liver as well as she is meeting SIRS criteria with feverPatient comes back today reports she continues to have significant pain and discomfort. She has been having ongoing and severe pain located in the mid to right upper abdomen, also she reports fairly significant pain when she takes a deep breath over the right lower lungShe has been feeling warm had a fever when she was in the hospital yesterday. Some nausea. No headaches. Tested negative for Covid a few days ago  pain is located primarily in the right upper abdomen under the right rib cage. EXAM: CT ANGIOGRAPHY CHEST CT ABDOMEN AND PELVIS WITH CONTRAST TECHNIQUE: Multidetector CT imaging of the chest was performed using the standard protocol during bolus administration of intravenous contrast. Multiplanar CT image reconstructions and MIPs were obtained to evaluate the vascular anatomy. Multidetector CT imaging of the abdomen and pelvis was performed using the standard protocol during bolus administration of intravenous contrast. CONTRAST:  48m OMNIPAQUE IOHEXOL 350 MG/ML SOLN COMPARISON:  Chest CTA and abdomen and pelvis CT dated 12/19/2018. FINDINGS: CTA CHEST FINDINGS Cardiovascular: There is satisfactory opacification of the pulmonary arteries to the segmental level. No evidence of a pulmonary embolism. Mediastinum/Nodes: No enlarged mediastinal, hilar, or axillary lymph nodes. Thyroid gland, trachea, and esophagus demonstrate no significant findings. Lungs/Pleura: Small bilateral pleural effusions. There is dependent opacity in the lower lobes consistent with atelectasis. Small areas of ground-glass opacity and small lung nodules noted. Dominant nodule in the right upper lobe, image 46, series 4, 6 mm. Several other small nodules are new from the prior CT. Area of ground-glass opacity adjacent to the oblique fissure in the left upper lobe is new. No pneumothorax. Musculoskeletal: No chest wall abnormality. No acute or significant osseous findings. Review of the MIP images confirms the above findings. CT ABDOMEN and PELVIS FINDINGS Hepatobiliary: Enlarged liver. There are multiple hypoattenuating areas throughout the liver. These areas are more apparent and more extensive than on the recent prior CT, particularly evident at the dome of segment 4A, along the anterior aspect of the right lobe and medial segment of the left lobe and along the inferior aspect of the right lobe, segment 5 and 6. Gallbladder is mostly  collapsed. There is increased attenuation material within the gallbladder similar to the prior CT. No bile duct dilation Pancreas: Unremarkable. No pancreatic ductal dilatation or surrounding inflammatory changes. Spleen: Normal in size without focal abnormality. Adrenals/Urinary Tract: No adrenal masses. Right kidney displaced inferiorly by the enlarged liver. Symmetric renal enhancement and excretion. No renal masses, stones or hydronephrosis. Normal ureters. Bladder wall is prominent, unchanged from the recent prior study. No bladder masses. Stomach/Bowel: Stomach is unremarkable, mostly decompressed. Small bowel and colon are normal in  caliber. No wall thickening or inflammation. Appendix not discretely seen. No findings of appendicitis. Vascular/Lymphatic: As noted on the prior CT, left portal vein is attenuated, but does show enhancement. No evidence of thrombosis. There are venous collaterals in the upper abdomen. Aorta is unremarkable. No enlarged lymph nodes. Prominent Peri celiac node noted on the prior study is stable. Reproductive: Status post hysterectomy. No adnexal masses. Other: Small amount of ascites increased when compared to the prior CT. Musculoskeletal: No acute or significant osseous findings. Review of the MIP images confirms the above findings. IMPRESSION: CHEST CTA 1. No evidence of a pulmonary embolism. 2. Small pleural effusions, new from the prior CT. There is increased opacity in the lower lobes that is consistent with atelectasis. There several new small nodules and small areas of ground-glass opacity when compared to the prior CT. Suspect these are inflammatory given change from the recent exam. ABDOMEN AND PELVIS CT 1. Multiple hypoattenuating liver lesions. These appear to have progressed when compared to the prior CT, although this change could be due to differences in contrast timing. The liver is enlarged, stable from the recent exam. Findings are concerning for multifocal  hepatocellular carcinoma. Follow-up liver MRI without and with contrast, when the patient can tolerate the procedure, is recommended. 2. Small amount ascites has increased in quantity from the prior study. 3. There are vascular collaterals in the upper abdomen, stable. Electronically Signed   By: Lajean Manes M.D.   On: 12/21/2018 18:36   Ct Abdomen Pelvis W Contrast  Result Date: 12/19/2018 CLINICAL DATA:  Chest pain, complex, intermediate/high prob of ACS/PE/AAS; Abd distension Epigastric pain. Exertional dyspnea. Cough. Weakness. History of breast cancer. EXAM: CT ANGIOGRAPHY CHEST CT ABDOMEN AND PELVIS WITH CONTRAST TECHNIQUE: Multidetector CT imaging of the chest was performed using the standard protocol during bolus administration of intravenous contrast. Multiplanar CT image reconstructions and MIPs were obtained to evaluate the vascular anatomy. Multidetector CT imaging of the abdomen and pelvis was performed using the standard protocol during bolus administration of intravenous contrast. No reported contrast reaction. CONTRAST:  176m OMNIPAQUE IOHEXOL 350 MG/ML SOLN COMPARISON:  Abdominal CT without contrast yesterday. Chest CT 01/01/2018 FINDINGS: CTA CHEST FINDINGS Cardiovascular: There are no filling defects within the pulmonary arteries to suggest pulmonary embolus. Thoracic aorta is normal in caliber without dissection. Left vertebral artery arises directly from the thoracic aorta, variant arch anatomy. Heart is normal in size. No pericardial effusion. Right chest port in place with tip in the SVC. Mediastinum/Nodes: No mediastinal or hilar adenopathy. No visualized thyroid nodule. No esophageal wall thickening. Lungs/Pleura: Mild patchy areas of subpleural nodular and ground-glass opacities. Basilar findings are unchanged from CT yesterday. 5 mm right upper lobe pulmonary nodule series 6, image 41. Sub solid nodularity slightly more inferiorly in the right lung series 6, image 45 measuring 8  mm. Tiny 2 mm nodule in the right upper lung series 6, image 32, may be fissural. Pulmonary nodules are new from prior chest CT. Perifissural in subpleural opacity in the right upper lobe, also series 6, image 32, nonspecific. No pulmonary edema. No pleural fluid. Trachea and mainstem bronchi are patent. Musculoskeletal: There are no acute or suspicious osseous abnormalities. Review of the MIP images confirms the above findings. CT ABDOMEN and PELVIS FINDINGS Hepatobiliary: Hepatic parenchyma diffusely heterogeneous with nodular contours. Liver is increased in size from December 2019 CT. Gallbladder is decompressed and not well assessed, intraluminal high density may be stones or sludge. Questionable wall thickening may be related to  nondistention. Pancreas: No ductal dilatation or inflammation. Spleen: Normal in size without focal abnormality. Adrenals/Urinary Tract: Normal adrenal glands. No hydronephrosis or perinephric edema. Homogeneous renal enhancement with symmetric excretion on delayed phase imaging. Urinary bladder is partially distended, mild bladder wall thickening. Stomach/Bowel: Lack of enteric contrast and paucity of intra-abdominal fat limits detailed bowel assessment. The stomach is nondistended. Questionable pre-pyloric gastric wall thickening. No small bowel obstruction or inflammatory change. Appendix not discretely visualized, no pericecal inflammation to suggest appendicitis. Transverse colon is tortuous. Mild colonic wall thickening at the splenic flexure, colon is decompressed, this is not well assessed. Questionable areas of descending colonic wall thickening, for example series 11, image 61. Vascular/Lymphatic: Right portal vein is patent. The left portal vein is attenuated without evidence of thrombus. Tortuous splenic vein with prominent collaterals in the left upper quadrant and left retroperitoneum. 9 mm perigastric lymph node series 11, image 29, nonspecific. No pelvic adenopathy.  Reproductive: Uterus surgically absent. Left ovary tentatively visualized and normal. Right ovary not definitively seen. No obvious adnexal mass. Other: Small volume of pelvic ascites measuring simple fluid density, similar to yesterday. Small perihepatic ascites anteriorly. No free air. Musculoskeletal: There are no acute or suspicious osseous abnormalities. Scattered bone islands in the pelvis. Review of the MIP images confirms the above findings. IMPRESSION: CT chest: 1. No pulmonary embolus. 2. Mild patchy areas of subpleural nodular and ground-glass opacities in both lungs. Slightly more confluent subpleural/perifissural right upper lobe opacity. Findings may be due to atelectasis or infection. 3. Small pulmonary nodules are new from December, infectious/inflammatory or metastatic. CT abdomen/pelvis: 1. Diffusely heterogeneous liver with nodular contours. Liver has increased in size since December 2019 CT. This may be due to underlying cirrhotic change versus diffuse metastatic disease. Recommend further evaluation with abdominal MRI. Left upper quadrant and retroperitoneal collaterals can be seen with portal hypertension. Left portal vein is attenuated, however no discrete thrombus. 2. Questionable pre-pyloric gastric wall thickening, peptic ulcer disease versus gastritis. 3. Bladder wall thickening, small volume abdominopelvic ascites, and areas of colonic wall thickening at the splenic flexure and descending colon are unchanged from CT yesterday. 4. Gallbladder is decompressed and not well assessed, intraluminal high density may be stones or sludge. Questionable gallbladder wall thickening may be related to nondistention or liver disease. This could be further evaluated with MRI or right upper quadrant ultrasound. Electronically Signed   By: Keith Rake M.D.   On: 12/19/2018 05:33   US Biopsy (liver)  Result Date: 12/20/2018 INDICATION: History of left breast carcinoma with imaging evidence of  probable diffuse metastatic disease in the liver, more prominently in the left lobe. The patient presents for biopsy. EXAM: ULTRASOUND GUIDED CORE BIOPSY OF LIVER MEDICATIONS: None. ANESTHESIA/SEDATION: Fentanyl 100 mcg IV; Versed 2.0 mg IV Moderate Sedation Time:  20 minutes. The patient was continuously monitored during the procedure by the interventional radiology nurse under my direct supervision. PROCEDURE: The procedure, risks, benefits, and alternatives were explained to the patient. Questions regarding the procedure were encouraged and answered. The patient understands and consents to the procedure. A time-out was performed prior to initiating the procedure. Ultrasound was used to localize lesions within the liver. The anterior abdominal wall was prepped with chlorhexidine in a sterile fashion, and a sterile drape was applied covering the operative field. A sterile gown and sterile gloves were used for the procedure. Local anesthesia was provided with 1% Lidocaine. Under direct ultrasound guidance, a 17 gauge trocar needle was advanced into the left lobe of the  liver. After confirming needle tip position, coaxial 18 gauge core biopsy samples were obtained. A total of 3 samples were obtained and submitted in formalin. A slurry of Gel-Foam pledgets was then slowly injected via the outer needle as the needle was retracted. Additional ultrasound was performed. COMPLICATIONS: None immediate. FINDINGS: Ultrasound demonstrates heterogeneous appearance of the liver with ill-defined lesions throughout the left lobe. Solid core biopsy samples were obtained by sampling lesions within the lateral segment of the left lobe. IMPRESSION: Ultrasound-guided core biopsy performed of hepatic lesions within the left lobe. Electronically Signed   By: Aletta Edouard M.D.   On: 12/20/2018 13:44   Dg Chest Portable 1 View  Result Date: 12/21/2018 CLINICAL DATA:  Pleuritic right chest pain EXAM: PORTABLE CHEST 1 VIEW  COMPARISON:  12/18/2018 chest radiograph. FINDINGS: Stable right subclavian Port-A-Cath terminating over the right atrium. Stable cardiomediastinal silhouette with normal heart size. No pneumothorax. No pleural effusion. No pulmonary edema. Curvilinear left lung base opacities. No acute consolidative airspace disease. IMPRESSION: Curvilinear left lung base opacities, favor scarring or atelectasis. No acute consolidative airspace disease. Electronically Signed   By: Ilona Sorrel M.D.   On: 12/21/2018 13:06   Dg Chest Portable 1 View  Result Date: 12/18/2018 CLINICAL DATA:  Fever abdominal pain EXAM: PORTABLE CHEST 1 VIEW COMPARISON:  12/31/2017 FINDINGS: Right-sided central venous port with tip over the cavoatrial region. No focal airspace disease or effusion. Normal heart size. No pneumothorax. IMPRESSION: No active disease. Electronically Signed   By: Donavan Foil M.D.   On: 12/18/2018 19:49   [place] Ascites (abdomen Limited)  Result Date: 12/30/2018 INDICATION: Ascites. EXAM: ULTRASOUND GUIDED PARACENTESIS MEDICATIONS: None. COMPLICATIONS: None immediate. PROCEDURE: Informed written consent was obtained from the patient after a discussion of the risks, benefits and alternatives to treatment. A timeout was performed prior to the initiation of the procedure. Ultrasound reveals a mild amount of ascites. Given the mild amount of ascites and given patient's low platelet count, it was elected to not perform paracentesis at this time. FINDINGS: Mild amount of ascites. Given patient's low platelet count it was not deemed safe to perform paracentesis at this time. Follow-up exam can be obtained if symptoms worsens. IMPRESSION: Mild amount of ascites. Given patient's low platelet count it was not deemed safe to perform paracentesis this time. Electronically Signed   By: Marcello Moores  Register   On: 12/30/2018 14:37    PERFORMANCE STATUS (ECOG) : 2 - Symptomatic, <50% confined to bed  Review of Systems Unless  otherwise noted, a complete review of systems is negative.  Physical Exam General: NAD, frail appearing, thin Pulmonary: unlabored, on O2 Extremities: no edema, no joint deformities Skin: no rashes Neurological: Weakness but otherwise nonfocal  IMPRESSION: Follow up visit today.   Patient remains thrombocytopenic.  Leukocytosis today.  She remains hypoxic and short of breath.  Concern for lymphangitic spread by medical oncology.  Pulmonary consult pending.  Patient received [place] cycle 1, day 1 yesterday.  Patient endorses anxiety, which was improved with as needed use of alprazolam.  Patient says that she asked for another dose but it was not time yet.  We will increase the frequency to 3 times daily as needed.  Patient met with her attorney yesterday.  She says that she established a living will and named her sister to be her healthcare power of attorney.  She will bring a copy to the hospital to be scanned into her chart.  I encouraged patient to speak with her sister regarding her  wishes from medical decision-making.  We did again discuss CODE STATUS today.  Patient remains undecided about decisions.  Case discussed with Dr. Grayland Ormond and Dr. Loleta Books.   PLAN: -Continue current scope of treatment -Increase alprazolam to 3 times daily as needed -Will follow   Time Total: 20 minutes  Visit consisted of counseling and education dealing with the complex and emotionally intense issues of symptom management and palliative care in the setting of serious and potentially life-threatening illness.Greater than 50%  of this time was spent counseling and coordinating care related to the above assessment and plan.  Signed by: Altha Harm, PhD, NP-C

## 2019-01-03 NOTE — Progress Notes (Signed)
Dr Loleta Books is aware of patient HR still in the 130's, no new orders at this time, he said to make sure patient rests and does not get out of bed at this time.

## 2019-01-03 NOTE — Progress Notes (Signed)
Shelby [name]  [phone] [phone]  ID: Shelby [name] OB: [birth year]  MR#: [id number]  [id number]  Patient Care Team: Jerrol Banana., MD as PCP - General (Family Medicine) [name], MD as Consulting Physician (Cardiology) Alfonzo Feller, RN as [place] Management  CHIEF COMPLAINT: Stage IV breast cancer with liver metastasis.  INTERVAL HISTORY: Patient received her first infusion of [place] yesterday and tolerated it well.  She continues to have significant shortness of breath.  She had some mildly pink-tinged urine earlier.  She offers no further new complaints.  REVIEW OF SYSTEMS:   Review of Systems  Constitutional: Positive for malaise/fatigue. Negative for fever and weight loss.  Respiratory: Positive for shortness of breath. Negative for cough.   Cardiovascular: Positive for leg swelling. Negative for chest pain.  Gastrointestinal: Positive for abdominal pain.  Genitourinary: Negative.  Negative for dysuria.  Musculoskeletal: Negative.  Negative for back pain.  Skin: Negative.  Negative for rash.  Neurological: Positive for weakness. Negative for dizziness, focal weakness and headaches.  Endo/Heme/Allergies: Does not bruise/bleed easily.  Psychiatric/Behavioral: The patient is nervous/anxious.     As per HPI. Otherwise, a complete review of systems is negative.  PAST MEDICAL HISTORY: Past Medical History:  Diagnosis Date   Anemia    h/o with pregnancy   Anxiety    Asthma    allergy induced-no inhaler   Cancer ([place])    breast left    Complication of anesthesia    Environmental allergies    Family history of adverse reaction to anesthesia    son-breathing problems-coded 1st time when he was 2 and had another surgery at 66 and had to be admitted for breathing problems   Family history of breast cancer    GERD (gastroesophageal reflux disease)    Headache    migraines    Hypertension    Mitral valve disease    Mitral valve prolapse    Painful menstrual periods    PONV (postoperative nausea and vomiting)    Vertigo     PAST SURGICAL HISTORY: Past Surgical History:  Procedure Laterality Date   ABDOMINAL HYSTERECTOMY  2010   supracervical    AXILLARY LYMPH NODE DISSECTION Left 07/12/2018   Procedure: AXILLARY LYMPH NODE DISSECTION;  Surgeon: Robert Bellow, MD;  Location: [facility];  Service: General;  Laterality: Left;   BREAST BIOPSY Left 11/2017   invasive ductal carcinoma and metastatic LN   BREAST BIOPSY WITH SENTINEL LYMPH NODE BIOPSY AND NEEDLE LOCALIZATION Left 07/12/2018   Procedure: BREAST BIOPSY WIDE EXCISION WITH SENTINEL NODE  AND NEEDLE LOCALIZATION OF [name] LEFT;  Surgeon: Robert Bellow, MD;  Location: [facility];  Service: General;  Laterality: Left;   BUNIONECTOMY Bilateral    MANDIBLE RECONSTRUCTION     age 89-underbite   PORTACATH PLACEMENT Right 12/31/2017   Procedure: INSERTION PORT-A-CATH;  Surgeon: Robert Bellow, MD;  Location: [facility];  Service: General;  Laterality: Right;   RE-EXCISION OF BREAST LUMPECTOMY Left 07/24/2018   Procedure: RE-EXCISION OF BREAST LUMPECTOMY LEFT;  Surgeon: Robert Bellow, MD;  Location: [facility];  Service: General;  Laterality: Left;   SHOULDER ARTHROSCOPY WITH OPEN ROTATOR CUFF REPAIR Right 04/26/2017   Procedure: SHOULDER ARTHROSCOPY WITH OPEN ROTATOR CUFF REPAIR,SUBACROMINAL DECOMPRESSION;  Surgeon: Thornton Park, MD;  Location: [facility];  Service: Orthopedics;  Laterality: Right;   TONSILLECTOMY      FAMILY HISTORY: Family History  Problem Relation Age of Onset  Breast cancer Mother 50   Diabetes Father    Cancer Maternal Uncle        colon   Breast cancer Maternal Grandmother 48    ADVANCED DIRECTIVES (Y/N):  @ADVDIR @  HEALTH MAINTENANCE: Social History   Tobacco Use   Smoking status: Former Smoker    Packs/day: 0.50    Years: 10.00      Pack years: 5.00    Types: Cigarettes    Quit date: 04/20/2007    Years since quitting: 11.7   Smokeless tobacco: Never Used   Tobacco comment: social smoker back then  Substance Use Topics   Alcohol use: Yes    Comment: occas   Drug use: No     Colonoscopy:  PAP:  Bone density:  Lipid panel:  Allergies  Allergen Reactions   Hydrocodone Hives    Swollen face - many years ago. Thinks she has tolerated since.   Iodine Hives   Peanut Butter Flavor Other (See Comments)    Made mouth tingle   Shellfish Allergy Hives   Vancomycin Other (See Comments)    Itching and forehead turned red    Current Facility-Administered Medications  Medication Dose Route Frequency Provider Last Rate Last Dose   0.9 %  sodium chloride infusion   Intravenous PRN Reesa Chew, Jeanella Flattery, MD   Stopped at 01/02/19 1128   ALPRAZolam (XANAX) tablet 0.25-0.5 mg  0.25-0.5 mg Oral TID PRN Borders, Kirt Boys, NP   0.5 mg at 01/03/19 1740   bisacodyl (DULCOLAX) EC tablet 5 mg  5 mg Oral Daily PRN Damita Lack, MD   5 mg at 12/31/18 0858   Chlorhexidine Gluconate Cloth 2 % PADS 6 each  6 each Topical Daily Damita Lack, MD   6 each at 01/02/19 0757   cholecalciferol (VITAMIN D) tablet 2,000 Units  2,000 Units Oral QPM Amin, Ankit Chirag, MD   2,000 Units at 01/03/19 1725   ciprofloxacin (CIPRO) tablet 500 mg  500 mg Oral BID Tsosie Billing, MD   500 mg at 01/03/19 0923   dexamethasone (DECADRON) tablet 8 mg  8 mg Oral Q8H Amin, Ankit Chirag, MD   8 mg at 01/03/19 1314   famotidine (PEPCID) tablet 20 mg  20 mg Oral Daily PRN Amin, Ankit Chirag, MD   20 mg at 12/31/18 1158   feeding supplement (ENSURE ENLIVE) (ENSURE ENLIVE) liquid 237 mL  237 mL Oral BID BM Amin, Ankit Chirag, MD   237 mL at 12/31/18 1158   fentaNYL (DURAGESIC) 25 MCG/HR 1 patch  1 patch Transdermal Q72H Lloyd Huger, MD   1 patch at 01/01/19 1254   fentaNYL (SUBLIMAZE) injection 25 mcg  25 mcg  Intravenous Q4H PRN Amin, Ankit Chirag, MD       furosemide (LASIX) injection 40 mg  40 mg Intravenous Daily Ottie Glazier, MD   40 mg at 01/03/19 1335   HYDROmorphone (DILAUDID) injection 1 mg  1 mg Intravenous Q1H PRN [name] [place], MD   1 mg at 12/09/2018 1600   HYDROmorphone (DILAUDID) tablet 1 mg  1 mg Oral Q4H PRN Amin, Ankit Chirag, MD       insulin aspart (novoLOG) injection 0-9 Units  0-9 Units Subcutaneous TID WC Amin, Ankit Chirag, MD   2 Units at 01/03/19 1740   losartan (COZAAR) tablet 50 mg  50 mg Oral Daily Amin, Ankit Chirag, MD   50 mg at 01/03/19 0923   multivitamin with minerals tablet 1 tablet  1 tablet  Oral Daily Damita Lack, MD   1 tablet at 01/03/19 [id number]   omega-3 acid ethyl esters (LOVAZA) capsule 1,000 mg  1,000 mg Oral QPM Amin, Ankit Chirag, MD   1,000 mg at 01/03/19 1725   ondansetron (ZOFRAN) tablet 4 mg  4 mg Oral Q6H PRN Damita Lack, MD   4 mg at 01/01/19 0539   Or   ondansetron (ZOFRAN) injection 4 mg  4 mg Intravenous Q6H PRN Amin, Ankit Chirag, MD   4 mg at 01/03/19 1333   pantoprazole (PROTONIX) EC tablet 40 mg  40 mg Oral Daily Amin, Ankit Chirag, MD   40 mg at 01/03/19 0923   polyethylene glycol (MIRALAX / GLYCOLAX) packet 17 g  17 g Oral Daily Lorella Nimrod, MD   17 g at 01/03/19 0923   simethicone (MYLICON) chewable tablet 80 mg  80 mg Oral QID PRN Gerald Dexter, RPH   80 mg at 01/01/19 1739   sodium chloride flush (NS) 0.9 % injection 10 mL  10 mL Intravenous q morning - 10a Amin, Ankit Chirag, MD   10 mL at 01/03/19 0930   sodium chloride flush (NS) 0.9 % injection 10 mL  10 mL Intravenous PRN Amin, Ankit Chirag, MD   10 mL at 12/08/2018 1735   sucralfate (CARAFATE) tablet 1 g  1 g Oral TID WC & HS Amin, Ankit Chirag, MD   1 g at 01/03/19 1725   traMADol (ULTRAM) tablet 50 mg  50 mg Oral Q12H PRN Damita Lack, MD   50 mg at 01/03/19 0623    OBJECTIVE: Vitals:   01/03/19 1746 01/03/19 1813  BP:  103/80  Pulse:  (!) 139 (!) 138  Resp: 17 17  Temp: 98.1 F (36.7 C) 98 F (36.7 C)  SpO2:  91%     Body mass index is 25.66 kg/m.    ECOG FS:3 - Symptomatic, >50% confined to bed  General: Well-developed, well-nourished, mild respiratory distress. Eyes: Pink conjunctiva, anicteric sclera. HEENT: Normocephalic, moist mucous membranes, clear oropharnyx. Lungs: Clear to auscultation bilaterally. Heart: Regular rate and rhythm. No rubs, murmurs, or gallops. Abdomen: Hepatomegaly, mildly tender. Musculoskeletal: No edema, cyanosis, or clubbing. Neuro: Alert, answering all questions appropriately. Cranial nerves grossly intact. Skin: No rashes or petechiae noted. Psych: Normal affect.  LAB RESULTS:  Lab Results  Component Value Date   NA 132 (L) 01/03/2019   K 4.7 01/03/2019   CL 93 (L) 01/03/2019   CO2 27 01/03/2019   GLUCOSE 139 (H) 01/03/2019   BUN 26 (H) 01/03/2019   CREATININE 0.49 01/03/2019   CALCIUM 8.6 (L) 01/03/2019   PROT 5.6 (L) 01/02/2019   ALBUMIN 2.7 (L) 01/02/2019   AST 179 (H) 01/02/2019   ALT 108 (H) 01/02/2019   ALKPHOS 186 (H) 01/02/2019   BILITOT 1.0 01/02/2019   GFRNONAA >60 01/03/2019   GFRAA >60 01/03/2019    Lab Results  Component Value Date   WBC 20.7 (H) 01/03/2019   NEUTROABS 3.0 12/23/2018   HGB 12.4 01/03/2019   HCT 34.9 (L) 01/03/2019   MCV 90.9 01/03/2019   PLT 20 (LL) 01/03/2019     STUDIES: Ct Abdomen Pelvis Wo Contrast  Addendum Date: 12/27/2018   ADDENDUM REPORT: 12/08/2018 10:16 ADDENDUM: Urinary bladder wall mildly thickened. A degree of cystitis questioned. Electronically Signed   By: Lowella Grip III M.D.   On: 12/10/2018 10:16   Result Date: 12/14/2018 CLINICAL DATA:  Abdominal pain with nausea and distention. Breast carcinoma with  known liver metastases EXAM: CT ABDOMEN AND PELVIS WITHOUT CONTRAST TECHNIQUE: Multidetector CT imaging of the abdomen and pelvis was performed following the standard protocol without oral or IV contrast.  COMPARISON:  December 21, 2018 FINDINGS: Lower chest: There is a right pleural effusion with atelectatic change in the right base. There is atelectatic change in the left base with rather minimal left pleural effusion. Small pericardial effusion noted. Hepatobiliary: There is widespread metastatic disease is seen throughout the liver, better delineated on recent contrast-enhanced study. Gallbladder is largely collapsed. No biliary duct dilatation evident. Pancreas: No pancreatic mass or inflammatory focus. Spleen: No splenic lesions are evident. Adrenals/Urinary Tract: Adrenals appear unremarkable bilaterally. Kidneys bilaterally show no evident mass or hydronephrosis on either side. There is no evident renal or ureteral calculus on either side. Urinary bladder is midline. The urinary bladder wall thickness is mildly increased. Stomach/Bowel: There is moderate stool in the colon. There is no appreciable bowel wall or mesenteric thickening. No evident bowel obstruction. Terminal ileal region appears unremarkable. There is no free air or portal venous air. Vascular/Lymphatic: No abdominal aortic aneurysm. No arterial lesions seen on this noncontrast enhanced study. Multiple upper abdominal venous collaterals are again noted without change from recent study. A focal periceliac lymph node measuring 1.6 x 1.3 cm is stable. No new adenopathy evident compared to recent study. Reproductive: Uterus absent.  No pelvic mass evident. Other: There is moderate ascites, slightly increased compared to recent study. No abscess evident in the abdomen or pelvis. No periappendiceal region inflammation. Appendix not appreciable. Musculoskeletal: No lytic or destructive bone lesions are evident. A small sclerotic focus in the right sacrum may represent a small bone island. No intramuscular lesions are evident. IMPRESSION: 1.  Moderate ascites, slightly increased compared to recent study. 2.  Widespread hepatic metastatic disease. 3. No  bowel obstruction. No abscess in the abdomen or pelvis. No periappendiceal region inflammation. 4.  Enlarged periceliac lymph node, suspected due to neoplasm. 5. Venous collaterals in the upper abdomen, primarily on the left, stable. Further assessment with respect to etiology not possible without intravenous contrast. Appearance stable compared to recent contrast enhanced CT examination. 6.  Uterus absent. 7. Small pleural effusions with bibasilar atelectatic change. Small pericardial effusion. Electronically Signed: By: Lowella Grip III M.D. On: 12/15/2018 10:13   Ct Abdomen Pelvis Wo Contrast  Addendum Date: 12/18/2018   ADDENDUM REPORT: 12/18/2018 20:18 ADDENDUM: These results were called by telephone on 12/18/2018 at 8:18 pm to provider [facility] , who verbally acknowledged these results. Electronically Signed   By: Lovena Le M.D.   On: 12/18/2018 20:18   Result Date: 12/18/2018 CLINICAL DATA:  Abdominal pain and distension, undergoing chemotherapy for breast cancer, history of IBS and diverticulitis EXAM: CT ABDOMEN AND PELVIS WITHOUT CONTRAST TECHNIQUE: Multidetector CT imaging of the abdomen and pelvis was performed following the standard protocol without IV contrast. COMPARISON:  CT abdomen pelvis 01/01/2018 FINDINGS: Lower chest: The patchy areas of subpleural ground-glass opacity could reflect atelectasis or early infection. Cardiac size is normal. Trace pericardial effusion, similar to prior. Hepatobiliary: Ill-defined regions of hypoattenuation are seen the anterior left lobe liver. Slightly nodular hepatic surface contour which is new from comparison exam. These features are both incompletely assessed in the absence of contrast media. Gallbladder appears largely contracted at the time of exam. No biliary ductal dilatation. Pancreas: Unremarkable. No pancreatic ductal dilatation or surrounding inflammatory changes. Spleen: Normal in size without focal abnormality. Adrenals/Urinary  Tract: No suspicious adrenal lesions. No visible  or contour deforming renal lesions. No urolithiasis or hydronephrosis mild circumferential bladder wall thickening. Stomach/Bowel: Distal esophagus, stomach and duodenal sweep are unremarkable. No small bowel wall thickening or dilatation. No evidence of obstruction. Appendix is not clearly identified. No pericecal inflammation. There is some mild mural thickening of the splenic flexure and descending colon remaining portions of the colon are free of mural thickening or dilatation. Vascular/Lymphatic: Upper abdominal venous collateralization and splenorenal shunting is noted. No suspicious or enlarged lymph nodes in the included lymphatic chains. Reproductive: Uterus is surgically absent. No concerning adnexal lesions. Other: Small volume of low-attenuation ascites noted in the low abdomen. Small volume of perihepatic low-attenuation ascites is present as well. Musculoskeletal: Multilevel degenerative changes are present in the imaged portions of the spine. No acute osseous abnormality or suspicious osseous lesion. IMPRESSION: 1. Interval development of cirrhotic stigmata including a nodular liver surface contour and increasing upper abdominal venous collateralization. Small volume of ascites is noted as well. 2. Ill-defined regions of hypoattenuation are seen within the anterior left lobe liver, which are incompletely assessed in the absence of contrast media. Furthermore findings are worrisome in the setting of known malignancy and potential intrinsic liver disease. Consider further evaluation with MRI or ultrasound if patient is unable to tolerate CT contrast due to allergy. 3. Mild mural thickening of the splenic flexure and descending colon, could reflect change related to the additional cirrhotic findings/portal colopathy though should exclude a an acute colitis clinically. 4. Patchy areas of subpleural ground-glass opacity in the visualized lung bases could  reflect atelectasis or early infection. 5. Mild circumferential bladder wall thickening, which may be related to underdistention. Correlate with urinalysis to exclude cystitis. 6. Unchanged trace pericardial effusion. Electronically Signed: By: Lovena Le M.D. On: 12/18/2018 20:07   Dg Chest 1 View  Result Date: 12/23/2018 CLINICAL DATA:  Cirrhosis, ascites, SIRS, history LEFT breast cancer, hypertension EXAM: CHEST  1 VIEW COMPARISON:  Portable exam 1016 hours compared to 12/21/2018 FINDINGS: RIGHT subclavian Port-A-Cath with tip projecting over superior RIGHT atrium, unchanged. Normal heart size, mediastinal contours, and pulmonary vascularity. Bibasilar atelectasis. Remaining lungs clear. No pleural effusion or pneumothorax. IMPRESSION: Bibasilar atelectasis. Electronically Signed   By: Lavonia Dana M.D.   On: 12/23/2018 10:58   Dg Chest 2 View  Result Date: 01/03/2019 CLINICAL DATA:  Hypoxia today. EXAM: CHEST - 2 VIEW COMPARISON:  12/23/2018 FINDINGS: Right subclavian Port-A-Cath unchanged. Lungs are adequately inflated a demonstrate mild patchy opacification over the anterior right middle lobe. Small amount of posterior right pleural fluid. Cardiomediastinal silhouette and remainder of the exam is unchanged. IMPRESSION: Patchy airspace opacification over the right middle lobe likely infection. Small amount right pleural fluid. Electronically Signed   By: Marin Olp M.D.   On: 01/03/2019 08:25   Ct Angio Chest Pe W Or Wo Contrast  Result Date: 12/31/2018 CLINICAL DATA:  Hypoxemia EXAM: CT ANGIOGRAPHY CHEST WITH CONTRAST TECHNIQUE: Multidetector CT imaging of the chest was performed using the standard protocol during bolus administration of intravenous contrast. Multiplanar CT image reconstructions and MIPs were obtained to evaluate the vascular anatomy. CONTRAST:  61mL OMNIPAQUE IOHEXOL 350 MG/ML SOLN COMPARISON:  None. FINDINGS: Cardiovascular: No filling defects in the pulmonary arteries to  suggest pulmonary emboli. Heart is normal size. Aorta is normal caliber. Small pericardial effusion. Mediastinum/Nodes: No mediastinal, hilar, or axillary adenopathy. Trachea and esophagus are unremarkable. Thyroid unremarkable. Lungs/Pleura: Trace left effusion and small right effusion. Bilateral lower lobe airspace opacities are noted, right greater than left which  could reflect atelectasis or pneumonia. There are scattered predominantly peripheral ground-glass opacities in the upper lobes which could reflect atypical/viral infection. Upper Abdomen: While not imaged in its entirety, the liver appears prominent. Recommend clinical correlation for possible hepatomegaly. Small amount of ascites surrounding the liver and spleen. Musculoskeletal: No acute bony abnormality. Chest wall soft tissues are unremarkable. Right chest wall Port-A-Cath in place with the tip in the SVC. Review of the MIP images confirms the above findings. IMPRESSION: No evidence of pulmonary embolus. Small right effusion and trace left effusion. Small pericardial effusion. Bilateral lower lobe atelectasis or infiltrate/pneumonia. Patchy peripheral ground-glass opacities in the upper lobes could reflect atypical/viral pneumonia. COVID pneumonia cannot be excluded. Liver appears prominent, but is not imaged in its entirety. Recommend clinical correlation for possible hepatomegaly. Perihepatic and perisplenic ascites. Electronically Signed   By: Rolm Baptise M.D.   On: 12/31/2018 19:30   Ct Angio Chest Pe W And/or Wo Contrast  Result Date: 12/21/2018 CLINICAL DATA:  48 y.o. female discharged yesterday for concerns of abdominal pain with a work-up regarding her liver as well as she is meeting SIRS criteria with feverPatient comes back today reports she continues to have significant pain and discomfort. She has been having ongoing and severe pain located in the mid to right upper abdomen, also she reports fairly significant pain when she takes a  deep breath over the right lower lungShe has been feeling warm had a fever when she was in the hospital yesterday. Some nausea. No headaches. Tested negative for Covid a few days ago pain is located primarily in the right upper abdomen under the right rib cage. EXAM: CT ANGIOGRAPHY CHEST CT ABDOMEN AND PELVIS WITH CONTRAST TECHNIQUE: Multidetector CT imaging of the chest was performed using the standard protocol during bolus administration of intravenous contrast. Multiplanar CT image reconstructions and MIPs were obtained to evaluate the vascular anatomy. Multidetector CT imaging of the abdomen and pelvis was performed using the standard protocol during bolus administration of intravenous contrast. CONTRAST:  73mL OMNIPAQUE IOHEXOL 350 MG/ML SOLN COMPARISON:  Chest CTA and abdomen and pelvis CT dated 12/19/2018. FINDINGS: CTA CHEST FINDINGS Cardiovascular: There is satisfactory opacification of the pulmonary arteries to the segmental level. No evidence of a pulmonary embolism. Mediastinum/Nodes: No enlarged mediastinal, hilar, or axillary lymph nodes. Thyroid gland, trachea, and esophagus demonstrate no significant findings. Lungs/Pleura: Small bilateral pleural effusions. There is dependent opacity in the lower lobes consistent with atelectasis. Small areas of ground-glass opacity and small lung nodules noted. Dominant nodule in the right upper lobe, image 46, series 4, 6 mm. Several other small nodules are new from the prior CT. Area of ground-glass opacity adjacent to the oblique fissure in the left upper lobe is new. No pneumothorax. Musculoskeletal: No chest wall abnormality. No acute or significant osseous findings. Review of the MIP images confirms the above findings. CT ABDOMEN and PELVIS FINDINGS Hepatobiliary: Enlarged liver. There are multiple hypoattenuating areas throughout the liver. These areas are more apparent and more extensive than on the recent prior CT, particularly evident at the dome of  segment 4A, along the anterior aspect of the right lobe and medial segment of the left lobe and along the inferior aspect of the right lobe, segment 5 and 6. Gallbladder is mostly collapsed. There is increased attenuation material within the gallbladder similar to the prior CT. No bile duct dilation Pancreas: Unremarkable. No pancreatic ductal dilatation or surrounding inflammatory changes. Spleen: Normal in size without focal abnormality. Adrenals/Urinary Tract: No adrenal  masses. Right kidney displaced inferiorly by the enlarged liver. Symmetric renal enhancement and excretion. No renal masses, stones or hydronephrosis. Normal ureters. Bladder wall is prominent, unchanged from the recent prior study. No bladder masses. Stomach/Bowel: Stomach is unremarkable, mostly decompressed. Small bowel and colon are normal in caliber. No wall thickening or inflammation. Appendix not discretely seen. No findings of appendicitis. Vascular/Lymphatic: As noted on the prior CT, left portal vein is attenuated, but does show enhancement. No evidence of thrombosis. There are venous collaterals in the upper abdomen. Aorta is unremarkable. No enlarged lymph nodes. Prominent Peri celiac node noted on the prior study is stable. Reproductive: Status post hysterectomy. No adnexal masses. Other: Small amount of ascites increased when compared to the prior CT. Musculoskeletal: No acute or significant osseous findings. Review of the MIP images confirms the above findings. IMPRESSION: CHEST CTA 1. No evidence of a pulmonary embolism. 2. Small pleural effusions, new from the prior CT. There is increased opacity in the lower lobes that is consistent with atelectasis. There several new small nodules and small areas of ground-glass opacity when compared to the prior CT. Suspect these are inflammatory given change from the recent exam. ABDOMEN AND PELVIS CT 1. Multiple hypoattenuating liver lesions. These appear to have progressed when compared to  the prior CT, although this change could be due to differences in contrast timing. The liver is enlarged, stable from the recent exam. Findings are concerning for multifocal hepatocellular carcinoma. Follow-up liver MRI without and with contrast, when the patient can tolerate the procedure, is recommended. 2. Small amount ascites has increased in quantity from the prior study. 3. There are vascular collaterals in the upper abdomen, stable. Electronically Signed   By: Lajean Manes M.D.   On: 12/21/2018 18:36   Ct Angio Chest Pe W And/or Wo Contrast  Result Date: 12/19/2018 CLINICAL DATA:  Chest pain, complex, intermediate/high prob of ACS/PE/AAS; Abd distension Epigastric pain. Exertional dyspnea. Cough. Weakness. History of breast cancer. EXAM: CT ANGIOGRAPHY CHEST CT ABDOMEN AND PELVIS WITH CONTRAST TECHNIQUE: Multidetector CT imaging of the chest was performed using the standard protocol during bolus administration of intravenous contrast. Multiplanar CT image reconstructions and MIPs were obtained to evaluate the vascular anatomy. Multidetector CT imaging of the abdomen and pelvis was performed using the standard protocol during bolus administration of intravenous contrast. No reported contrast reaction. CONTRAST:  165mL OMNIPAQUE IOHEXOL 350 MG/ML SOLN COMPARISON:  Abdominal CT without contrast yesterday. Chest CT 01/01/2018 FINDINGS: CTA CHEST FINDINGS Cardiovascular: There are no filling defects within the pulmonary arteries to suggest pulmonary embolus. Thoracic aorta is normal in caliber without dissection. Left vertebral artery arises directly from the thoracic aorta, variant arch anatomy. Heart is normal in size. No pericardial effusion. Right chest port in place with tip in the SVC. Mediastinum/Nodes: No mediastinal or hilar adenopathy. No visualized thyroid nodule. No esophageal wall thickening. Lungs/Pleura: Mild patchy areas of subpleural nodular and ground-glass opacities. Basilar findings are  unchanged from CT yesterday. 5 mm right upper lobe pulmonary nodule series 6, image 41. Sub solid nodularity slightly more inferiorly in the right lung series 6, image 45 measuring 8 mm. Tiny 2 mm nodule in the right upper lung series 6, image 32, may be fissural. Pulmonary nodules are new from prior chest CT. Perifissural in subpleural opacity in the right upper lobe, also series 6, image 32, nonspecific. No pulmonary edema. No pleural fluid. Trachea and mainstem bronchi are patent. Musculoskeletal: There are no acute or suspicious osseous abnormalities. Review of  the MIP images confirms the above findings. CT ABDOMEN and PELVIS FINDINGS Hepatobiliary: Hepatic parenchyma diffusely heterogeneous with nodular contours. Liver is increased in size from December 2019 CT. Gallbladder is decompressed and not well assessed, intraluminal high density may be stones or sludge. Questionable wall thickening may be related to nondistention. Pancreas: No ductal dilatation or inflammation. Spleen: Normal in size without focal abnormality. Adrenals/Urinary Tract: Normal adrenal glands. No hydronephrosis or perinephric edema. Homogeneous renal enhancement with symmetric excretion on delayed phase imaging. Urinary bladder is partially distended, mild bladder wall thickening. Stomach/Bowel: Lack of enteric contrast and paucity of intra-abdominal fat limits detailed bowel assessment. The stomach is nondistended. Questionable pre-pyloric gastric wall thickening. No small bowel obstruction or inflammatory change. Appendix not discretely visualized, no pericecal inflammation to suggest appendicitis. Transverse colon is tortuous. Mild colonic wall thickening at the splenic flexure, colon is decompressed, this is not well assessed. Questionable areas of descending colonic wall thickening, for example series 11, image 61. Vascular/Lymphatic: Right portal vein is patent. The left portal vein is attenuated without evidence of thrombus.  Tortuous splenic vein with prominent collaterals in the left upper quadrant and left retroperitoneum. 9 mm perigastric lymph node series 11, image 29, nonspecific. No pelvic adenopathy. Reproductive: Uterus surgically absent. Left ovary tentatively visualized and normal. Right ovary not definitively seen. No obvious adnexal mass. Other: Small volume of pelvic ascites measuring simple fluid density, similar to yesterday. Small perihepatic ascites anteriorly. No free air. Musculoskeletal: There are no acute or suspicious osseous abnormalities. Scattered bone islands in the pelvis. Review of the MIP images confirms the above findings. IMPRESSION: CT chest: 1. No pulmonary embolus. 2. Mild patchy areas of subpleural nodular and ground-glass opacities in both lungs. Slightly more confluent subpleural/perifissural right upper lobe opacity. Findings may be due to atelectasis or infection. 3. Small pulmonary nodules are new from December, infectious/inflammatory or metastatic. CT abdomen/pelvis: 1. Diffusely heterogeneous liver with nodular contours. Liver has increased in size since December 2019 CT. This may be due to underlying cirrhotic change versus diffuse metastatic disease. Recommend further evaluation with abdominal MRI. Left upper quadrant and retroperitoneal collaterals can be seen with portal hypertension. Left portal vein is attenuated, however no discrete thrombus. 2. Questionable pre-pyloric gastric wall thickening, peptic ulcer disease versus gastritis. 3. Bladder wall thickening, small volume abdominopelvic ascites, and areas of colonic wall thickening at the splenic flexure and descending colon are unchanged from CT yesterday. 4. Gallbladder is decompressed and not well assessed, intraluminal high density may be stones or sludge. Questionable gallbladder wall thickening may be related to nondistention or liver disease. This could be further evaluated with MRI or right upper quadrant ultrasound.  Electronically Signed   By: Keith Rake M.D.   On: 12/19/2018 05:33   Mr Abdomen W Wo Contrast  Result Date: 12/19/2018 CLINICAL DATA:  Abdominal pain in a patient with breast cancer. EXAM: MRI ABDOMEN WITHOUT AND WITH CONTRAST TECHNIQUE: Multiplanar multisequence MR imaging of the abdomen was performed both before and after the administration of intravenous contrast. CONTRAST:  79mL GADAVIST GADOBUTROL 1 MMOL/ML IV SOLN COMPARISON:  CT of 12/19/2018 FINDINGS: Lower chest: Limited assessment of the lower chest on MRI is unremarkable. Hepatobiliary: Diffuse infiltration of much of the liver, nearly the entire left hepatic lobe, medial section and lateral section in addition to anterior right hepatic lobe with confluent infiltrative restricted diffusion and heterogeneous enhancement measuring approximately 21 by 7.4 cm. Diffuse metastatic disease is strongly suspected. Boundaries are well-demarcated without adjacent edema in the bordering  hepatic parenchyma Discrete hepatic lesions are also scattered about the right hepatic lobe largest in the dome of the right hemi liver measuring approximately 2.5 cm in greatest dimension. These discrete lesions display a targetoid appearance. There are numerous lesions in the right hepatic lobe, lesions are found in all hepatic subsegment. Pancreas: No mass, inflammatory changes, or other parenchymal abnormality identified. Mild ductal distension in the tail of the pancreas may be due to adjacent mass effect. No visible pancreatic lesion is noted. Spleen:  Within normal limits in size and appearance. Adrenals/Urinary Tract: No masses identified. No evidence of hydronephrosis. Stomach/Bowel: Limited assessment of the gastrointestinal tract without signs of acute process. Vascular/Lymphatic: Left-sided peri-renal venous collaterals of uncertain significance perhaps related to elevated portal pressures in the setting of diffuse hepatic involvement. The left portal vein is  occluded. Right portal vein is patent. Scattered small lymph nodes in the upper abdomen. There is either slow flow or thrombus within the main portal vein at the level of the splenic portal confluence extending in the splenic vein. The SMV is patent. There is mass effect upon the vessel at the level of the splenic portal confluence due to hepatic enlargement. This is best seen on subtraction images, image 54, series 20. Note that slow flow in an otherwise opacified vessel can cause artifact with a similar appearance however, this is seen on contrasted and non contrasted imaging with a similar appearance. Other:  None. Musculoskeletal: No signs of acute or destructive bone process. IMPRESSION: 1. Diffuse infiltration of much of the liver, nearly the entire left hepatic lobe, with confluent infiltrative restricted diffusion and heterogeneous enhancement measuring approximately 21 by 7.4 cm. Diffuse metastatic disease is strongly suspected with multifocal lesions in the right hepatic lobe. Superimposed infection is difficult to exclude though areas at the boundary of these lesions show no overt signs of edema. 2. Highly narrowed or occluded left portal vein with potential thrombus versus slow flow in the splenoportal confluence. This was patent on the exam of 12/19/2018; however, given above findings a venous phase CT or even ultrasound hepatic Doppler with special attention to the splenic portal confluence may be helpful to exclude this possibility, finding is also exhibited on TrueFISP sequence (image 24, series 10). 3. 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 4. Mild ductal distension in the tail of the pancreas, potentially due to mass effect. Attention on follow-up. 5. Critical Value/emergent results were called by telephone at the time of interpretation on 12/19/2018 at 6:43 pm to [place] , who verbally  acknowledged these results. Electronically Signed   By: Zetta Bills M.D.   On: 12/19/2018 18:24   US Abdomen Complete  Result Date: 12/19/2018 CLINICAL DATA:  Abdominal pain EXAM: ABDOMEN ULTRASOUND COMPLETE COMPARISON:  MRI earlier today FINDINGS: Gallbladder: Gallbladder is contracted with associated mild wall thickening measuring up to 4 mm. Sludge noted within the gallbladder. No visible stones. Common bile duct: Diameter: Normal caliber, 3 mm Liver: Diffusely heterogeneous echotexture throughout the liver corresponding to the abnormality seen on today's MRI suspicious forinfiltrating mass. Main portal vein is patent on color Doppler imaging with normal direction of blood flow towards the liver. The area of possible thrombus seen on MRI at the level of the splenic portal confluence not visualized. IVC: No abnormality visualized. Pancreas: Visualized portion unremarkable. Spleen: Size and appearance within normal limits. Right Kidney: Length: 11.0 cm. Echogenicity within normal limits. No mass or  hydronephrosis visualized. Left Kidney: Length: 10.5 cm. Echogenicity within normal limits. No mass or hydronephrosis visualized. Abdominal aorta: No aneurysm visualized. Other findings: None. IMPRESSION: Heterogeneous appearance throughout much of the left hepatic lobe as seen on today's MRI concerning for infiltrating mass/tumor. Main portal vein is patent. Contracted gallbladder with gallbladder wall thickening likely related to contracted state. Sludge within the gallbladder. No sonographic Murphy sign or visible stones. Electronically Signed   By: Rolm Baptise M.D.   On: 12/19/2018 20:35   Ct Abdomen Pelvis W Contrast  Result Date: 12/21/2018 CLINICAL DATA:  48 y.o. female discharged yesterday for concerns of abdominal pain with a work-up regarding her liver as well as she is meeting SIRS criteria with feverPatient comes back today reports she continues to have significant pain and discomfort. She has  been having ongoing and severe pain located in the mid to right upper abdomen, also she reports fairly significant pain when she takes a deep breath over the right lower lungShe has been feeling warm had a fever when she was in the hospital yesterday. Some nausea. No headaches. Tested negative for Covid a few days ago pain is located primarily in the right upper abdomen under the right rib cage. EXAM: CT ANGIOGRAPHY CHEST CT ABDOMEN AND PELVIS WITH CONTRAST TECHNIQUE: Multidetector CT imaging of the chest was performed using the standard protocol during bolus administration of intravenous contrast. Multiplanar CT image reconstructions and MIPs were obtained to evaluate the vascular anatomy. Multidetector CT imaging of the abdomen and pelvis was performed using the standard protocol during bolus administration of intravenous contrast. CONTRAST:  83mL OMNIPAQUE IOHEXOL 350 MG/ML SOLN COMPARISON:  Chest CTA and abdomen and pelvis CT dated 12/19/2018. FINDINGS: CTA CHEST FINDINGS Cardiovascular: There is satisfactory opacification of the pulmonary arteries to the segmental level. No evidence of a pulmonary embolism. Mediastinum/Nodes: No enlarged mediastinal, hilar, or axillary lymph nodes. Thyroid gland, trachea, and esophagus demonstrate no significant findings. Lungs/Pleura: Small bilateral pleural effusions. There is dependent opacity in the lower lobes consistent with atelectasis. Small areas of ground-glass opacity and small lung nodules noted. Dominant nodule in the right upper lobe, image 46, series 4, 6 mm. Several other small nodules are new from the prior CT. Area of ground-glass opacity adjacent to the oblique fissure in the left upper lobe is new. No pneumothorax. Musculoskeletal: No chest wall abnormality. No acute or significant osseous findings. Review of the MIP images confirms the above findings. CT ABDOMEN and PELVIS FINDINGS Hepatobiliary: Enlarged liver. There are multiple hypoattenuating areas  throughout the liver. These areas are more apparent and more extensive than on the recent prior CT, particularly evident at the dome of segment 4A, along the anterior aspect of the right lobe and medial segment of the left lobe and along the inferior aspect of the right lobe, segment 5 and 6. Gallbladder is mostly collapsed. There is increased attenuation material within the gallbladder similar to the prior CT. No bile duct dilation Pancreas: Unremarkable. No pancreatic ductal dilatation or surrounding inflammatory changes. Spleen: Normal in size without focal abnormality. Adrenals/Urinary Tract: No adrenal masses. Right kidney displaced inferiorly by the enlarged liver. Symmetric renal enhancement and excretion. No renal masses, stones or hydronephrosis. Normal ureters. Bladder wall is prominent, unchanged from the recent prior study. No bladder masses. Stomach/Bowel: Stomach is unremarkable, mostly decompressed. Small bowel and colon are normal in caliber. No wall thickening or inflammation. Appendix not discretely seen. No findings of appendicitis. Vascular/Lymphatic: As noted on the prior CT, left portal vein  is attenuated, but does show enhancement. No evidence of thrombosis. There are venous collaterals in the upper abdomen. Aorta is unremarkable. No enlarged lymph nodes. Prominent Peri celiac node noted on the prior study is stable. Reproductive: Status post hysterectomy. No adnexal masses. Other: Small amount of ascites increased when compared to the prior CT. Musculoskeletal: No acute or significant osseous findings. Review of the MIP images confirms the above findings. IMPRESSION: CHEST CTA 1. No evidence of a pulmonary embolism. 2. Small pleural effusions, new from the prior CT. There is increased opacity in the lower lobes that is consistent with atelectasis. There several new small nodules and small areas of ground-glass opacity when compared to the prior CT. Suspect these are inflammatory given change  from the recent exam. ABDOMEN AND PELVIS CT 1. Multiple hypoattenuating liver lesions. These appear to have progressed when compared to the prior CT, although this change could be due to differences in contrast timing. The liver is enlarged, stable from the recent exam. Findings are concerning for multifocal hepatocellular carcinoma. Follow-up liver MRI without and with contrast, when the patient can tolerate the procedure, is recommended. 2. Small amount ascites has increased in quantity from the prior study. 3. There are vascular collaterals in the upper abdomen, stable. Electronically Signed   By: Lajean Manes M.D.   On: 12/21/2018 18:36   Ct Abdomen Pelvis W Contrast  Result Date: 12/19/2018 CLINICAL DATA:  Chest pain, complex, intermediate/high prob of ACS/PE/AAS; Abd distension Epigastric pain. Exertional dyspnea. Cough. Weakness. History of breast cancer. EXAM: CT ANGIOGRAPHY CHEST CT ABDOMEN AND PELVIS WITH CONTRAST TECHNIQUE: Multidetector CT imaging of the chest was performed using the standard protocol during bolus administration of intravenous contrast. Multiplanar CT image reconstructions and MIPs were obtained to evaluate the vascular anatomy. Multidetector CT imaging of the abdomen and pelvis was performed using the standard protocol during bolus administration of intravenous contrast. No reported contrast reaction. CONTRAST:  177mL OMNIPAQUE IOHEXOL 350 MG/ML SOLN COMPARISON:  Abdominal CT without contrast yesterday. Chest CT 01/01/2018 FINDINGS: CTA CHEST FINDINGS Cardiovascular: There are no filling defects within the pulmonary arteries to suggest pulmonary embolus. Thoracic aorta is normal in caliber without dissection. Left vertebral artery arises directly from the thoracic aorta, variant arch anatomy. Heart is normal in size. No pericardial effusion. Right chest port in place with tip in the SVC. Mediastinum/Nodes: No mediastinal or hilar adenopathy. No visualized thyroid nodule. No  esophageal wall thickening. Lungs/Pleura: Mild patchy areas of subpleural nodular and ground-glass opacities. Basilar findings are unchanged from CT yesterday. 5 mm right upper lobe pulmonary nodule series 6, image 41. Sub solid nodularity slightly more inferiorly in the right lung series 6, image 45 measuring 8 mm. Tiny 2 mm nodule in the right upper lung series 6, image 32, may be fissural. Pulmonary nodules are new from prior chest CT. Perifissural in subpleural opacity in the right upper lobe, also series 6, image 32, nonspecific. No pulmonary edema. No pleural fluid. Trachea and mainstem bronchi are patent. Musculoskeletal: There are no acute or suspicious osseous abnormalities. Review of the MIP images confirms the above findings. CT ABDOMEN and PELVIS FINDINGS Hepatobiliary: Hepatic parenchyma diffusely heterogeneous with nodular contours. Liver is increased in size from December 2019 CT. Gallbladder is decompressed and not well assessed, intraluminal high density may be stones or sludge. Questionable wall thickening may be related to nondistention. Pancreas: No ductal dilatation or inflammation. Spleen: Normal in size without focal abnormality. Adrenals/Urinary Tract: Normal adrenal glands. No hydronephrosis or perinephric edema.  Homogeneous renal enhancement with symmetric excretion on delayed phase imaging. Urinary bladder is partially distended, mild bladder wall thickening. Stomach/Bowel: Lack of enteric contrast and paucity of intra-abdominal fat limits detailed bowel assessment. The stomach is nondistended. Questionable pre-pyloric gastric wall thickening. No small bowel obstruction or inflammatory change. Appendix not discretely visualized, no pericecal inflammation to suggest appendicitis. Transverse colon is tortuous. Mild colonic wall thickening at the splenic flexure, colon is decompressed, this is not well assessed. Questionable areas of descending colonic wall thickening, for example series 11,  image 61. Vascular/Lymphatic: Right portal vein is patent. The left portal vein is attenuated without evidence of thrombus. Tortuous splenic vein with prominent collaterals in the left upper quadrant and left retroperitoneum. 9 mm perigastric lymph node series 11, image 29, nonspecific. No pelvic adenopathy. Reproductive: Uterus surgically absent. Left ovary tentatively visualized and normal. Right ovary not definitively seen. No obvious adnexal mass. Other: Small volume of pelvic ascites measuring simple fluid density, similar to yesterday. Small perihepatic ascites anteriorly. No free air. Musculoskeletal: There are no acute or suspicious osseous abnormalities. Scattered bone islands in the pelvis. Review of the MIP images confirms the above findings. IMPRESSION: CT chest: 1. No pulmonary embolus. 2. Mild patchy areas of subpleural nodular and ground-glass opacities in both lungs. Slightly more confluent subpleural/perifissural right upper lobe opacity. Findings may be due to atelectasis or infection. 3. Small pulmonary nodules are new from December, infectious/inflammatory or metastatic. CT abdomen/pelvis: 1. Diffusely heterogeneous liver with nodular contours. Liver has increased in size since December 2019 CT. This may be due to underlying cirrhotic change versus diffuse metastatic disease. Recommend further evaluation with abdominal MRI. Left upper quadrant and retroperitoneal collaterals can be seen with portal hypertension. Left portal vein is attenuated, however no discrete thrombus. 2. Questionable pre-pyloric gastric wall thickening, peptic ulcer disease versus gastritis. 3. Bladder wall thickening, small volume abdominopelvic ascites, and areas of colonic wall thickening at the splenic flexure and descending colon are unchanged from CT yesterday. 4. Gallbladder is decompressed and not well assessed, intraluminal high density may be stones or sludge. Questionable gallbladder wall thickening may be related  to nondistention or liver disease. This could be further evaluated with MRI or right upper quadrant ultrasound. Electronically Signed   By: Keith Rake M.D.   On: 12/19/2018 05:33   US Biopsy (liver)  Result Date: 12/20/2018 INDICATION: History of left breast carcinoma with imaging evidence of probable diffuse metastatic disease in the liver, more prominently in the left lobe. The patient presents for biopsy. EXAM: ULTRASOUND GUIDED CORE BIOPSY OF LIVER MEDICATIONS: None. ANESTHESIA/SEDATION: Fentanyl 100 mcg IV; Versed 2.0 mg IV Moderate Sedation Time:  20 minutes. The patient was continuously monitored during the procedure by the interventional radiology nurse under my direct supervision. PROCEDURE: The procedure, risks, benefits, and alternatives were explained to the patient. Questions regarding the procedure were encouraged and answered. The patient understands and consents to the procedure. A time-out was performed prior to initiating the procedure. Ultrasound was used to localize lesions within the liver. The anterior abdominal wall was prepped with chlorhexidine in a sterile fashion, and a sterile drape was applied covering the operative field. A sterile gown and sterile gloves were used for the procedure. Local anesthesia was provided with 1% Lidocaine. Under direct ultrasound guidance, a 17 gauge trocar needle was advanced into the left lobe of the liver. After confirming needle tip position, coaxial 18 gauge core biopsy samples were obtained. A total of 3 samples were obtained and submitted in  formalin. A slurry of Gel-Foam pledgets was then slowly injected via the outer needle as the needle was retracted. Additional ultrasound was performed. COMPLICATIONS: None immediate. FINDINGS: Ultrasound demonstrates heterogeneous appearance of the liver with ill-defined lesions throughout the left lobe. Solid core biopsy samples were obtained by sampling lesions within the lateral segment of the left  lobe. IMPRESSION: Ultrasound-guided core biopsy performed of hepatic lesions within the left lobe. Electronically Signed   By: Aletta Edouard M.D.   On: 12/20/2018 13:44   Dg Chest Portable 1 View  Result Date: 12/21/2018 CLINICAL DATA:  Pleuritic right chest pain EXAM: PORTABLE CHEST 1 VIEW COMPARISON:  12/18/2018 chest radiograph. FINDINGS: Stable right subclavian Port-A-Cath terminating over the right atrium. Stable cardiomediastinal silhouette with normal heart size. No pneumothorax. No pleural effusion. No pulmonary edema. Curvilinear left lung base opacities. No acute consolidative airspace disease. IMPRESSION: Curvilinear left lung base opacities, favor scarring or atelectasis. No acute consolidative airspace disease. Electronically Signed   By: Ilona Sorrel M.D.   On: 12/21/2018 13:06   Dg Chest Portable 1 View  Result Date: 12/18/2018 CLINICAL DATA:  Fever abdominal pain EXAM: PORTABLE CHEST 1 VIEW COMPARISON:  12/31/2017 FINDINGS: Right-sided central venous port with tip over the cavoatrial region. No focal airspace disease or effusion. Normal heart size. No pneumothorax. IMPRESSION: No active disease. Electronically Signed   By: Donavan Foil M.D.   On: 12/18/2018 19:49   [place] Ascites (abdomen Limited)  Result Date: 12/30/2018 INDICATION: Ascites. EXAM: ULTRASOUND GUIDED PARACENTESIS MEDICATIONS: None. COMPLICATIONS: None immediate. PROCEDURE: Informed written consent was obtained from the patient after a discussion of the risks, benefits and alternatives to treatment. A timeout was performed prior to the initiation of the procedure. Ultrasound reveals a mild amount of ascites. Given the mild amount of ascites and given patient's low platelet count, it was elected to not perform paracentesis at this time. FINDINGS: Mild amount of ascites. Given patient's low platelet count it was not deemed safe to perform paracentesis at this time. Follow-up exam can be obtained if symptoms worsens.  IMPRESSION: Mild amount of ascites. Given patient's low platelet count it was not deemed safe to perform paracentesis this time. Electronically Signed   By: Marcello Moores  Register   On: 12/30/2018 14:37    ASSESSMENT: Stage IV breast cancer with liver metastasis.  PLAN:    1.  Stage IV triple negative breast cancer: Recent liver biopsy confirmed diagnosis.  CT scan results from December 29, 2018 with no obvious progression of disease from 1 week prior.  Despite patient's significant thrombocytopenia which puts her at significant risk for bleeding, she wishes to pursue palliative chemotherapy with Halaven.  Halaven has been dose reduced secondary to liver dysfunction and she received her first infusion yesterday.  Cycle 1, day 8 will be due on January 09, 2019.  Patient acknowledged that pursuing chemotherapy with her significant thrombocytopenia could possibly make things worse, but she does not wish to pursue hospice at this time. Appreciate palliative care input. 2.  Abdominal pain: Likely related to malignancy, appreciate ID input.  Discussed possible MRA of liver, but if patient had portal vein thrombosis could not anticoagulate given her thrombocytopenia so this can be put on hold for now. 3.  Elevated white blood cell count: White blood cells trending up, monitor. 4.  Thrombocytopenia: Patient expressed understanding the risks of proceeding with chemotherapy.  Platelet count has trended down to 20 and she has pink-tinged urine, therefore will transfuse 1 unit platelets today. 5.  Shortness  of breath: Appreciate pulmonary input.  Unclear etiology, but possibly could be lymphangitic spread of breast cancer.  This was NOT shared with patient since it would not change the overall plan or prognosis. 6.  Liver dysfunction: AST and ALT are trending up.  Bilirubin remains within normal limits.  Will follow.  Lloyd Huger, MD   01/03/2019 6:16 PM

## 2019-01-03 NOTE — Progress Notes (Signed)
Transported pt to ICU 6. Pt placed back on HFNC 60%, 40L. Pt Spo2 92%, HR 130's, RR 26. Pt complains of oxygen feels to warm. Heater range 24-28 degrees. Eventually turned heater off. Pt states it feels slightly better. Report given to ICU RT.

## 2019-01-03 NOTE — Consult Note (Signed)
Pulmonary Medicine          Date: 01/03/2019,   MRN# [id number] Shelby [name] [birth year]     AdmissionWeight: 64.9 kg                 CurrentWeight: 72.1 kg  Referring physician: Dr. Loleta Books    CHIEF COMPLAINT:   Increased oxygen requirement with acute hypoxemic respiratory failure   HISTORY OF PRESENT ILLNESS   This is a very pleasant 48 year old female with a history of advanced stage breast CA triple negative currently being followed by Dr. Grayland Ormond status post chemotherapy, with distant metastasis, status post XRT in September 2020.  Currently employed with [employer] working in [place] [facility].  Patient had received capecitabine 9x2 as well Eribulin x 1 during this hospitalization.  She came in with complaints of abdominal pain and was found to also be febrile however her fevers did not correlate with timeline of chemotherapy although there are known adverse effects to these medications including respiratory infection and dyspnea.  She was initially in ED on November 18 with mildly decreased WBC count of 3.6 with mild transaminitis and AKI had COVID-19 testing which was normal.  At that time she had a CT abdomen which showed bladder thickening suggestive of cystitis, widespread hepatic metastasis moderate ascites abdominal lymphadenopathy small pleural effusion.  CT chest with PE protocol was done which was independently reviewed by me and with pictorial documentation included below.  CT does show focal and bilateral peripheral groundglass infiltrates with bilateral small pleural effusions worse on the right there is absence of consolidated infiltrate, emphysematous changes and absence of interlobular septal thickening to suggest lymphangitic carcinomatosis.  Patient was noted to have severe desaturation of oxygen with minimal exertion while getting up to go to the bathroom with SPO2 dropping to 65% during my evaluation.  Patient's fluid balance is net positive  over 1 L.  Patient is hypoalbuminemic at 2.5-2.7.  PAST MEDICAL HISTORY   Past Medical History:  Diagnosis Date   Anemia    h/o with pregnancy   Anxiety    Asthma    allergy induced-no inhaler   Cancer ([place])    breast left    Complication of anesthesia    Environmental allergies    Family history of adverse reaction to anesthesia    son-breathing problems-coded 1st time when he was 2 and had another surgery at 17 and had to be admitted for breathing problems   Family history of breast cancer    GERD (gastroesophageal reflux disease)    Headache    migraines   Hypertension    Mitral valve disease    Mitral valve prolapse    Painful menstrual periods    PONV (postoperative nausea and vomiting)    Vertigo      SURGICAL HISTORY   Past Surgical History:  Procedure Laterality Date   ABDOMINAL HYSTERECTOMY  2010   supracervical    AXILLARY LYMPH NODE DISSECTION Left 07/12/2018   Procedure: AXILLARY LYMPH NODE DISSECTION;  Surgeon: Robert Bellow, MD;  Location: [facility];  Service: General;  Laterality: Left;   BREAST BIOPSY Left 11/2017   invasive ductal carcinoma and metastatic LN   BREAST BIOPSY WITH SENTINEL LYMPH NODE BIOPSY AND NEEDLE LOCALIZATION Left 07/12/2018   Procedure: BREAST BIOPSY WIDE EXCISION WITH SENTINEL NODE  AND NEEDLE LOCALIZATION OF [name] LEFT;  Surgeon: Robert Bellow, MD;  Location: [facility];  Service: General;  Laterality: Left;  BUNIONECTOMY Bilateral    MANDIBLE RECONSTRUCTION     age 61-underbite   PORTACATH PLACEMENT Right 12/31/2017   Procedure: INSERTION PORT-A-CATH;  Surgeon: Robert Bellow, MD;  Location: [facility];  Service: General;  Laterality: Right;   RE-EXCISION OF BREAST LUMPECTOMY Left 07/24/2018   Procedure: RE-EXCISION OF BREAST LUMPECTOMY LEFT;  Surgeon: Robert Bellow, MD;  Location: [facility];  Service: General;  Laterality: Left;   SHOULDER ARTHROSCOPY WITH OPEN ROTATOR CUFF REPAIR  Right 04/26/2017   Procedure: SHOULDER ARTHROSCOPY WITH OPEN ROTATOR CUFF REPAIR,SUBACROMINAL DECOMPRESSION;  Surgeon: Thornton Park, MD;  Location: [facility];  Service: Orthopedics;  Laterality: Right;   TONSILLECTOMY       FAMILY HISTORY   Family History  Problem Relation Age of Onset   Breast cancer Mother 79   Diabetes Father    Cancer Maternal Uncle        colon   Breast cancer Maternal Grandmother 64     SOCIAL HISTORY   Social History   Tobacco Use   Smoking status: Former Smoker    Packs/day: 0.50    Years: 10.00    Pack years: 5.00    Types: Cigarettes    Quit date: 04/20/2007    Years since quitting: 11.7   Smokeless tobacco: Never Used   Tobacco comment: social smoker back then  Substance Use Topics   Alcohol use: Yes    Comment: occas   Drug use: No     MEDICATIONS    Home Medication:    Current Medication:  Current Facility-Administered Medications:    0.9 %  sodium chloride infusion, , Intravenous, PRN, Amin, Jeanella Flattery, MD, Stopped at 01/02/19 1128   ALPRAZolam (XANAX) tablet 0.25-0.5 mg, 0.25-0.5 mg, Oral, BID PRN, Reesa Chew, Ankit Chirag, MD, 0.5 mg at 01/03/19 0340   bisacodyl (DULCOLAX) EC tablet 5 mg, 5 mg, Oral, Daily PRN, Reesa Chew, Ankit Chirag, MD, 5 mg at 12/31/18 0858   Chlorhexidine Gluconate Cloth 2 % PADS 6 each, 6 each, Topical, Daily, Amin, Ankit Chirag, MD, 6 each at 01/02/19 0757   cholecalciferol (VITAMIN D) tablet 2,000 Units, 2,000 Units, Oral, QPM, Amin, Ankit Chirag, MD, 2,000 Units at 01/02/19 1733   ciprofloxacin (CIPRO) tablet 500 mg, 500 mg, Oral, BID, Tsosie Billing, MD, 500 mg at 01/03/19 0923   dexamethasone (DECADRON) tablet 8 mg, 8 mg, Oral, Q8H, Amin, Ankit Chirag, MD, 8 mg at 01/03/19 0606   famotidine (PEPCID) tablet 20 mg, 20 mg, Oral, Daily PRN, Amin, Ankit Chirag, MD, 20 mg at 12/31/18 1158   feeding supplement (ENSURE ENLIVE) (ENSURE ENLIVE) liquid 237 mL, 237 mL, Oral, BID BM, Amin, Ankit  Chirag, MD, 237 mL at 12/31/18 1158   fentaNYL (DURAGESIC) 25 MCG/HR 1 patch, 1 patch, Transdermal, Q72H, Lloyd Huger, MD, 1 patch at 01/01/19 1254   fentaNYL (SUBLIMAZE) injection 25 mcg, 25 mcg, Intravenous, Q4H PRN, Amin, Ankit Chirag, MD   HYDROmorphone (DILAUDID) injection 1 mg, 1 mg, Intravenous, Q1H PRN, [name] [place], MD, 1 mg at 12/21/2018 1600   HYDROmorphone (DILAUDID) tablet 1 mg, 1 mg, Oral, Q4H PRN, Amin, Ankit Chirag, MD   insulin aspart (novoLOG) injection 0-9 Units, 0-9 Units, Subcutaneous, TID WC, Amin, Ankit Chirag, MD, 1 Units at 01/03/19 0931   losartan (COZAAR) tablet 50 mg, 50 mg, Oral, Daily, Amin, Ankit Chirag, MD, 50 mg at 01/03/19 [id number]   multivitamin with minerals tablet 1 tablet, 1 tablet, Oral, Daily, Amin, Ankit Chirag, MD, 1 tablet at 01/03/19 [phone]  omega-3 acid ethyl esters (LOVAZA) capsule 1,000 mg, 1,000 mg, Oral, QPM, Amin, Ankit Chirag, MD, 1,000 mg at 01/02/19 1733   ondansetron (ZOFRAN) tablet 4 mg, 4 mg, Oral, Q6H PRN, 4 mg at 01/01/19 0539 **OR** ondansetron (ZOFRAN) injection 4 mg, 4 mg, Intravenous, Q6H PRN, Amin, Ankit Chirag, MD   pantoprazole (PROTONIX) EC tablet 40 mg, 40 mg, Oral, Daily, Amin, Ankit Chirag, MD, 40 mg at 01/03/19 0923   polyethylene glycol (MIRALAX / GLYCOLAX) packet 17 g, 17 g, Oral, Daily, Lorella Nimrod, MD, 17 g at 01/03/19 [id number]   simethicone (MYLICON) chewable tablet 80 mg, 80 mg, Oral, QID PRN, Gerald Dexter, RPH, 80 mg at 01/01/19 1739   sodium chloride flush (NS) 0.9 % injection 10 mL, 10 mL, Intravenous, q morning - 10a, Amin, Ankit Chirag, MD, 10 mL at 01/03/19 0930   sodium chloride flush (NS) 0.9 % injection 10 mL, 10 mL, Intravenous, PRN, Amin, Ankit Chirag, MD, 10 mL at 12/22/2018 1735   sucralfate (CARAFATE) tablet 1 g, 1 g, Oral, TID WC & HS, Amin, Ankit Chirag, MD, 1 g at 01/03/19 [id number]   traMADol (ULTRAM) tablet 50 mg, 50 mg, Oral, Q12H PRN, Damita Lack, MD, 50 mg at 01/03/19  [id number]    ALLERGIES   Hydrocodone, Iodine, Peanut butter flavor, Shellfish allergy, and Vancomycin     REVIEW OF SYSTEMS    Review of Systems:  Gen:  Denies  fever, sweats, chills weigh loss  HEENT: Denies blurred vision, double vision, ear pain, eye pain, hearing loss, nose bleeds, sore throat Cardiac:  No dizziness, chest pain or heaviness, chest tightness,edema Resp:   Reports Dyspnea at rest worse with exertion Gi: Denies swallowing difficulty, stomach pain, nausea or vomiting, diarrhea, constipation, bowel incontinence Gu:  Denies bladder incontinence, burning urine Ext:   Denies Joint pain, stiffness or swelling Skin: Denies  skin rash, easy bruising or bleeding or hives Endoc:  Denies polyuria, polydipsia , polyphagia or weight change Psych:   Denies depression, insomnia or hallucinations   Other:  All other systems negative   VS: BP 124/88 (BP Location: Right Arm)    Pulse 99    Temp 97.8 F (36.6 C) (Oral)    Resp 17    Ht 5\' 6"  (1.676 m)    Wt 72.1 kg    SpO2 91%    BMI 25.66 kg/m      PHYSICAL EXAM    GENERAL:NAD, no fevers, chills, no weakness no fatigue HEAD: Normocephalic, atraumatic.  EYES: Pupils equal, round, reactive to light. Extraocular muscles intact. No scleral icterus.  MOUTH: Moist mucosal membrane. Dentition intact. No abscess noted.  EAR, NOSE, THROAT: Clear without exudates. No external lesions.  NECK: Supple. No thyromegaly. No nodules. No JVD.  PULMONARY: mild crackles bilaterally worse at bases CARDIOVASCULAR: S1 and S2. Regular rate and rhythm. No murmurs, rubs, or gallops. No edema. Pedal pulses 2+ bilaterally.  GASTROINTESTINAL: Soft, nontender, nondistended. No masses. Positive bowel sounds. No hepatosplenomegaly.  MUSCULOSKELETAL: No swelling, clubbing, or edema. Range of motion full in all extremities.  NEUROLOGIC: Cranial nerves II through XII are intact. No gross focal neurological deficits. Sensation intact. Reflexes intact.   SKIN: No ulceration, lesions, rashes, or cyanosis. Skin warm and dry. Turgor intact.  PSYCHIATRIC: Mood, affect within normal limits. The patient is awake, alert and oriented x 3. Insight, judgment intact.       IMAGING    Ct Abdomen Pelvis Wo Contrast  Addendum Date: 12/05/2018   ADDENDUM REPORT:  12/03/2018 10:16 ADDENDUM: Urinary bladder wall mildly thickened. A degree of cystitis questioned. Electronically Signed   By: Lowella Grip III M.D.   On: 12/18/2018 10:16   Result Date: 12/09/2018 CLINICAL DATA:  Abdominal pain with nausea and distention. Breast carcinoma with known liver metastases EXAM: CT ABDOMEN AND PELVIS WITHOUT CONTRAST TECHNIQUE: Multidetector CT imaging of the abdomen and pelvis was performed following the standard protocol without oral or IV contrast. COMPARISON:  December 21, 2018 FINDINGS: Lower chest: There is a right pleural effusion with atelectatic change in the right base. There is atelectatic change in the left base with rather minimal left pleural effusion. Small pericardial effusion noted. Hepatobiliary: There is widespread metastatic disease is seen throughout the liver, better delineated on recent contrast-enhanced study. Gallbladder is largely collapsed. No biliary duct dilatation evident. Pancreas: No pancreatic mass or inflammatory focus. Spleen: No splenic lesions are evident. Adrenals/Urinary Tract: Adrenals appear unremarkable bilaterally. Kidneys bilaterally show no evident mass or hydronephrosis on either side. There is no evident renal or ureteral calculus on either side. Urinary bladder is midline. The urinary bladder wall thickness is mildly increased. Stomach/Bowel: There is moderate stool in the colon. There is no appreciable bowel wall or mesenteric thickening. No evident bowel obstruction. Terminal ileal region appears unremarkable. There is no free air or portal venous air. Vascular/Lymphatic: No abdominal aortic aneurysm. No arterial lesions  seen on this noncontrast enhanced study. Multiple upper abdominal venous collaterals are again noted without change from recent study. A focal periceliac lymph node measuring 1.6 x 1.3 cm is stable. No new adenopathy evident compared to recent study. Reproductive: Uterus absent.  No pelvic mass evident. Other: There is moderate ascites, slightly increased compared to recent study. No abscess evident in the abdomen or pelvis. No periappendiceal region inflammation. Appendix not appreciable. Musculoskeletal: No lytic or destructive bone lesions are evident. A small sclerotic focus in the right sacrum may represent a small bone island. No intramuscular lesions are evident. IMPRESSION: 1.  Moderate ascites, slightly increased compared to recent study. 2.  Widespread hepatic metastatic disease. 3. No bowel obstruction. No abscess in the abdomen or pelvis. No periappendiceal region inflammation. 4.  Enlarged periceliac lymph node, suspected due to neoplasm. 5. Venous collaterals in the upper abdomen, primarily on the left, stable. Further assessment with respect to etiology not possible without intravenous contrast. Appearance stable compared to recent contrast enhanced CT examination. 6.  Uterus absent. 7. Small pleural effusions with bibasilar atelectatic change. Small pericardial effusion. Electronically Signed: By: Lowella Grip III M.D. On: 12/27/2018 10:13   Ct Abdomen Pelvis Wo Contrast  Addendum Date: 12/18/2018   ADDENDUM REPORT: 12/18/2018 20:18 ADDENDUM: These results were called by telephone on 12/18/2018 at 8:18 pm to provider [facility] , who verbally acknowledged these results. Electronically Signed   By: Lovena Le M.D.   On: 12/18/2018 20:18   Result Date: 12/18/2018 CLINICAL DATA:  Abdominal pain and distension, undergoing chemotherapy for breast cancer, history of IBS and diverticulitis EXAM: CT ABDOMEN AND PELVIS WITHOUT CONTRAST TECHNIQUE: Multidetector CT imaging of the abdomen and  pelvis was performed following the standard protocol without IV contrast. COMPARISON:  CT abdomen pelvis 01/01/2018 FINDINGS: Lower chest: The patchy areas of subpleural ground-glass opacity could reflect atelectasis or early infection. Cardiac size is normal. Trace pericardial effusion, similar to prior. Hepatobiliary: Ill-defined regions of hypoattenuation are seen the anterior left lobe liver. Slightly nodular hepatic surface contour which is new from comparison exam. These features are both incompletely assessed  in the absence of contrast media. Gallbladder appears largely contracted at the time of exam. No biliary ductal dilatation. Pancreas: Unremarkable. No pancreatic ductal dilatation or surrounding inflammatory changes. Spleen: Normal in size without focal abnormality. Adrenals/Urinary Tract: No suspicious adrenal lesions. No visible or contour deforming renal lesions. No urolithiasis or hydronephrosis mild circumferential bladder wall thickening. Stomach/Bowel: Distal esophagus, stomach and duodenal sweep are unremarkable. No small bowel wall thickening or dilatation. No evidence of obstruction. Appendix is not clearly identified. No pericecal inflammation. There is some mild mural thickening of the splenic flexure and descending colon remaining portions of the colon are free of mural thickening or dilatation. Vascular/Lymphatic: Upper abdominal venous collateralization and splenorenal shunting is noted. No suspicious or enlarged lymph nodes in the included lymphatic chains. Reproductive: Uterus is surgically absent. No concerning adnexal lesions. Other: Small volume of low-attenuation ascites noted in the low abdomen. Small volume of perihepatic low-attenuation ascites is present as well. Musculoskeletal: Multilevel degenerative changes are present in the imaged portions of the spine. No acute osseous abnormality or suspicious osseous lesion. IMPRESSION: 1. Interval development of cirrhotic stigmata  including a nodular liver surface contour and increasing upper abdominal venous collateralization. Small volume of ascites is noted as well. 2. Ill-defined regions of hypoattenuation are seen within the anterior left lobe liver, which are incompletely assessed in the absence of contrast media. Furthermore findings are worrisome in the setting of known malignancy and potential intrinsic liver disease. Consider further evaluation with MRI or ultrasound if patient is unable to tolerate CT contrast due to allergy. 3. Mild mural thickening of the splenic flexure and descending colon, could reflect change related to the additional cirrhotic findings/portal colopathy though should exclude a an acute colitis clinically. 4. Patchy areas of subpleural ground-glass opacity in the visualized lung bases could reflect atelectasis or early infection. 5. Mild circumferential bladder wall thickening, which may be related to underdistention. Correlate with urinalysis to exclude cystitis. 6. Unchanged trace pericardial effusion. Electronically Signed: By: Lovena Le M.D. On: 12/18/2018 20:07   Dg Chest 1 View  Result Date: 12/23/2018 CLINICAL DATA:  Cirrhosis, ascites, SIRS, history LEFT breast cancer, hypertension EXAM: CHEST  1 VIEW COMPARISON:  Portable exam 1016 hours compared to 12/21/2018 FINDINGS: RIGHT subclavian Port-A-Cath with tip projecting over superior RIGHT atrium, unchanged. Normal heart size, mediastinal contours, and pulmonary vascularity. Bibasilar atelectasis. Remaining lungs clear. No pleural effusion or pneumothorax. IMPRESSION: Bibasilar atelectasis. Electronically Signed   By: Lavonia Dana M.D.   On: 12/23/2018 10:58   Dg Chest 2 View  Result Date: 01/03/2019 CLINICAL DATA:  Hypoxia today. EXAM: CHEST - 2 VIEW COMPARISON:  12/23/2018 FINDINGS: Right subclavian Port-A-Cath unchanged. Lungs are adequately inflated a demonstrate mild patchy opacification over the anterior right middle lobe. Small amount  of posterior right pleural fluid. Cardiomediastinal silhouette and remainder of the exam is unchanged. IMPRESSION: Patchy airspace opacification over the right middle lobe likely infection. Small amount right pleural fluid. Electronically Signed   By: Marin Olp M.D.   On: 01/03/2019 08:25   Ct Angio Chest Pe W Or Wo Contrast  Result Date: 12/31/2018 CLINICAL DATA:  Hypoxemia EXAM: CT ANGIOGRAPHY CHEST WITH CONTRAST TECHNIQUE: Multidetector CT imaging of the chest was performed using the standard protocol during bolus administration of intravenous contrast. Multiplanar CT image reconstructions and MIPs were obtained to evaluate the vascular anatomy. CONTRAST:  70mL OMNIPAQUE IOHEXOL 350 MG/ML SOLN COMPARISON:  None. FINDINGS: Cardiovascular: No filling defects in the pulmonary arteries to suggest pulmonary emboli. Heart  is normal size. Aorta is normal caliber. Small pericardial effusion. Mediastinum/Nodes: No mediastinal, hilar, or axillary adenopathy. Trachea and esophagus are unremarkable. Thyroid unremarkable. Lungs/Pleura: Trace left effusion and small right effusion. Bilateral lower lobe airspace opacities are noted, right greater than left which could reflect atelectasis or pneumonia. There are scattered predominantly peripheral ground-glass opacities in the upper lobes which could reflect atypical/viral infection. Upper Abdomen: While not imaged in its entirety, the liver appears prominent. Recommend clinical correlation for possible hepatomegaly. Small amount of ascites surrounding the liver and spleen. Musculoskeletal: No acute bony abnormality. Chest wall soft tissues are unremarkable. Right chest wall Port-A-Cath in place with the tip in the SVC. Review of the MIP images confirms the above findings. IMPRESSION: No evidence of pulmonary embolus. Small right effusion and trace left effusion. Small pericardial effusion. Bilateral lower lobe atelectasis or infiltrate/pneumonia. Patchy peripheral  ground-glass opacities in the upper lobes could reflect atypical/viral pneumonia. COVID pneumonia cannot be excluded. Liver appears prominent, but is not imaged in its entirety. Recommend clinical correlation for possible hepatomegaly. Perihepatic and perisplenic ascites. Electronically Signed   By: Rolm Baptise M.D.   On: 12/31/2018 19:30   Ct Angio Chest Pe W And/or Wo Contrast  Result Date: 12/21/2018 CLINICAL DATA:  48 y.o. female discharged yesterday for concerns of abdominal pain with a work-up regarding her liver as well as she is meeting SIRS criteria with feverPatient comes back today reports she continues to have significant pain and discomfort. She has been having ongoing and severe pain located in the mid to right upper abdomen, also she reports fairly significant pain when she takes a deep breath over the right lower lungShe has been feeling warm had a fever when she was in the hospital yesterday. Some nausea. No headaches. Tested negative for Covid a few days ago pain is located primarily in the right upper abdomen under the right rib cage. EXAM: CT ANGIOGRAPHY CHEST CT ABDOMEN AND PELVIS WITH CONTRAST TECHNIQUE: Multidetector CT imaging of the chest was performed using the standard protocol during bolus administration of intravenous contrast. Multiplanar CT image reconstructions and MIPs were obtained to evaluate the vascular anatomy. Multidetector CT imaging of the abdomen and pelvis was performed using the standard protocol during bolus administration of intravenous contrast. CONTRAST:  37mL OMNIPAQUE IOHEXOL 350 MG/ML SOLN COMPARISON:  Chest CTA and abdomen and pelvis CT dated 12/19/2018. FINDINGS: CTA CHEST FINDINGS Cardiovascular: There is satisfactory opacification of the pulmonary arteries to the segmental level. No evidence of a pulmonary embolism. Mediastinum/Nodes: No enlarged mediastinal, hilar, or axillary lymph nodes. Thyroid gland, trachea, and esophagus demonstrate no significant  findings. Lungs/Pleura: Small bilateral pleural effusions. There is dependent opacity in the lower lobes consistent with atelectasis. Small areas of ground-glass opacity and small lung nodules noted. Dominant nodule in the right upper lobe, image 46, series 4, 6 mm. Several other small nodules are new from the prior CT. Area of ground-glass opacity adjacent to the oblique fissure in the left upper lobe is new. No pneumothorax. Musculoskeletal: No chest wall abnormality. No acute or significant osseous findings. Review of the MIP images confirms the above findings. CT ABDOMEN and PELVIS FINDINGS Hepatobiliary: Enlarged liver. There are multiple hypoattenuating areas throughout the liver. These areas are more apparent and more extensive than on the recent prior CT, particularly evident at the dome of segment 4A, along the anterior aspect of the right lobe and medial segment of the left lobe and along the inferior aspect of the right lobe, segment 5  and 6. Gallbladder is mostly collapsed. There is increased attenuation material within the gallbladder similar to the prior CT. No bile duct dilation Pancreas: Unremarkable. No pancreatic ductal dilatation or surrounding inflammatory changes. Spleen: Normal in size without focal abnormality. Adrenals/Urinary Tract: No adrenal masses. Right kidney displaced inferiorly by the enlarged liver. Symmetric renal enhancement and excretion. No renal masses, stones or hydronephrosis. Normal ureters. Bladder wall is prominent, unchanged from the recent prior study. No bladder masses. Stomach/Bowel: Stomach is unremarkable, mostly decompressed. Small bowel and colon are normal in caliber. No wall thickening or inflammation. Appendix not discretely seen. No findings of appendicitis. Vascular/Lymphatic: As noted on the prior CT, left portal vein is attenuated, but does show enhancement. No evidence of thrombosis. There are venous collaterals in the upper abdomen. Aorta is unremarkable.  No enlarged lymph nodes. Prominent Peri celiac node noted on the prior study is stable. Reproductive: Status post hysterectomy. No adnexal masses. Other: Small amount of ascites increased when compared to the prior CT. Musculoskeletal: No acute or significant osseous findings. Review of the MIP images confirms the above findings. IMPRESSION: CHEST CTA 1. No evidence of a pulmonary embolism. 2. Small pleural effusions, new from the prior CT. There is increased opacity in the lower lobes that is consistent with atelectasis. There several new small nodules and small areas of ground-glass opacity when compared to the prior CT. Suspect these are inflammatory given change from the recent exam. ABDOMEN AND PELVIS CT 1. Multiple hypoattenuating liver lesions. These appear to have progressed when compared to the prior CT, although this change could be due to differences in contrast timing. The liver is enlarged, stable from the recent exam. Findings are concerning for multifocal hepatocellular carcinoma. Follow-up liver MRI without and with contrast, when the patient can tolerate the procedure, is recommended. 2. Small amount ascites has increased in quantity from the prior study. 3. There are vascular collaterals in the upper abdomen, stable. Electronically Signed   By: Lajean Manes M.D.   On: 12/21/2018 18:36   Ct Angio Chest Pe W And/or Wo Contrast  Result Date: 12/19/2018 CLINICAL DATA:  Chest pain, complex, intermediate/high prob of ACS/PE/AAS; Abd distension Epigastric pain. Exertional dyspnea. Cough. Weakness. History of breast cancer. EXAM: CT ANGIOGRAPHY CHEST CT ABDOMEN AND PELVIS WITH CONTRAST TECHNIQUE: Multidetector CT imaging of the chest was performed using the standard protocol during bolus administration of intravenous contrast. Multiplanar CT image reconstructions and MIPs were obtained to evaluate the vascular anatomy. Multidetector CT imaging of the abdomen and pelvis was performed using the  standard protocol during bolus administration of intravenous contrast. No reported contrast reaction. CONTRAST:  139mL OMNIPAQUE IOHEXOL 350 MG/ML SOLN COMPARISON:  Abdominal CT without contrast yesterday. Chest CT 01/01/2018 FINDINGS: CTA CHEST FINDINGS Cardiovascular: There are no filling defects within the pulmonary arteries to suggest pulmonary embolus. Thoracic aorta is normal in caliber without dissection. Left vertebral artery arises directly from the thoracic aorta, variant arch anatomy. Heart is normal in size. No pericardial effusion. Right chest port in place with tip in the SVC. Mediastinum/Nodes: No mediastinal or hilar adenopathy. No visualized thyroid nodule. No esophageal wall thickening. Lungs/Pleura: Mild patchy areas of subpleural nodular and ground-glass opacities. Basilar findings are unchanged from CT yesterday. 5 mm right upper lobe pulmonary nodule series 6, image 41. Sub solid nodularity slightly more inferiorly in the right lung series 6, image 45 measuring 8 mm. Tiny 2 mm nodule in the right upper lung series 6, image 32, may be fissural. Pulmonary nodules  are new from prior chest CT. Perifissural in subpleural opacity in the right upper lobe, also series 6, image 32, nonspecific. No pulmonary edema. No pleural fluid. Trachea and mainstem bronchi are patent. Musculoskeletal: There are no acute or suspicious osseous abnormalities. Review of the MIP images confirms the above findings. CT ABDOMEN and PELVIS FINDINGS Hepatobiliary: Hepatic parenchyma diffusely heterogeneous with nodular contours. Liver is increased in size from December 2019 CT. Gallbladder is decompressed and not well assessed, intraluminal high density may be stones or sludge. Questionable wall thickening may be related to nondistention. Pancreas: No ductal dilatation or inflammation. Spleen: Normal in size without focal abnormality. Adrenals/Urinary Tract: Normal adrenal glands. No hydronephrosis or perinephric edema.  Homogeneous renal enhancement with symmetric excretion on delayed phase imaging. Urinary bladder is partially distended, mild bladder wall thickening. Stomach/Bowel: Lack of enteric contrast and paucity of intra-abdominal fat limits detailed bowel assessment. The stomach is nondistended. Questionable pre-pyloric gastric wall thickening. No small bowel obstruction or inflammatory change. Appendix not discretely visualized, no pericecal inflammation to suggest appendicitis. Transverse colon is tortuous. Mild colonic wall thickening at the splenic flexure, colon is decompressed, this is not well assessed. Questionable areas of descending colonic wall thickening, for example series 11, image 61. Vascular/Lymphatic: Right portal vein is patent. The left portal vein is attenuated without evidence of thrombus. Tortuous splenic vein with prominent collaterals in the left upper quadrant and left retroperitoneum. 9 mm perigastric lymph node series 11, image 29, nonspecific. No pelvic adenopathy. Reproductive: Uterus surgically absent. Left ovary tentatively visualized and normal. Right ovary not definitively seen. No obvious adnexal mass. Other: Small volume of pelvic ascites measuring simple fluid density, similar to yesterday. Small perihepatic ascites anteriorly. No free air. Musculoskeletal: There are no acute or suspicious osseous abnormalities. Scattered bone islands in the pelvis. Review of the MIP images confirms the above findings. IMPRESSION: CT chest: 1. No pulmonary embolus. 2. Mild patchy areas of subpleural nodular and ground-glass opacities in both lungs. Slightly more confluent subpleural/perifissural right upper lobe opacity. Findings may be due to atelectasis or infection. 3. Small pulmonary nodules are new from December, infectious/inflammatory or metastatic. CT abdomen/pelvis: 1. Diffusely heterogeneous liver with nodular contours. Liver has increased in size since December 2019 CT. This may be due to  underlying cirrhotic change versus diffuse metastatic disease. Recommend further evaluation with abdominal MRI. Left upper quadrant and retroperitoneal collaterals can be seen with portal hypertension. Left portal vein is attenuated, however no discrete thrombus. 2. Questionable pre-pyloric gastric wall thickening, peptic ulcer disease versus gastritis. 3. Bladder wall thickening, small volume abdominopelvic ascites, and areas of colonic wall thickening at the splenic flexure and descending colon are unchanged from CT yesterday. 4. Gallbladder is decompressed and not well assessed, intraluminal high density may be stones or sludge. Questionable gallbladder wall thickening may be related to nondistention or liver disease. This could be further evaluated with MRI or right upper quadrant ultrasound. Electronically Signed   By: Keith Rake M.D.   On: 12/19/2018 05:33   Mr Abdomen W Wo Contrast  Result Date: 12/19/2018 CLINICAL DATA:  Abdominal pain in a patient with breast cancer. EXAM: MRI ABDOMEN WITHOUT AND WITH CONTRAST TECHNIQUE: Multiplanar multisequence MR imaging of the abdomen was performed both before and after the administration of intravenous contrast. CONTRAST:  20mL GADAVIST GADOBUTROL 1 MMOL/ML IV SOLN COMPARISON:  CT of 12/19/2018 FINDINGS: Lower chest: Limited assessment of the lower chest on MRI is unremarkable. Hepatobiliary: Diffuse infiltration of much of the liver, nearly the entire  left hepatic lobe, medial section and lateral section in addition to anterior right hepatic lobe with confluent infiltrative restricted diffusion and heterogeneous enhancement measuring approximately 21 by 7.4 cm. Diffuse metastatic disease is strongly suspected. Boundaries are well-demarcated without adjacent edema in the bordering hepatic parenchyma Discrete hepatic lesions are also scattered about the right hepatic lobe largest in the dome of the right hemi liver measuring approximately 2.5 cm in greatest  dimension. These discrete lesions display a targetoid appearance. There are numerous lesions in the right hepatic lobe, lesions are found in all hepatic subsegment. Pancreas: No mass, inflammatory changes, or other parenchymal abnormality identified. Mild ductal distension in the tail of the pancreas may be due to adjacent mass effect. No visible pancreatic lesion is noted. Spleen:  Within normal limits in size and appearance. Adrenals/Urinary Tract: No masses identified. No evidence of hydronephrosis. Stomach/Bowel: Limited assessment of the gastrointestinal tract without signs of acute process. Vascular/Lymphatic: Left-sided peri-renal venous collaterals of uncertain significance perhaps related to elevated portal pressures in the setting of diffuse hepatic involvement. 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 Right portal vein is patent. Scattered small lymph nodes in the upper abdomen. There is either slow flow or thrombus within the main portal vein at the level of the splenic portal confluence extending in the splenic vein. The SMV is patent. There is mass effect upon the vessel at the level of the splenic portal confluence due to hepatic enlargement. This is best seen on subtraction images, image 54, series 20. Note that slow flow in an otherwise opacified vessel can cause artifact with a similar appearance however, this is seen on contrasted and non contrasted imaging with a similar appearance. Other:  None. Musculoskeletal: No signs of acute or destructive bone process. IMPRESSION: 1. Diffuse infiltration of much of the liver, nearly the entire left hepatic lobe, with confluent infiltrative restricted diffusion and heterogeneous enhancement measuring approximately 21 by 7.4 cm. Diffuse metastatic disease is strongly suspected with multifocal lesions in the right hepatic lobe. Superimposed infection is difficult to exclude though areas at the boundary of these lesions show no overt signs of edema. 2. 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 This was patent on the exam of 12/19/2018; however, given above findings a venous phase CT or even ultrasound hepatic Doppler with special attention to the splenic portal confluence may be helpful to exclude this possibility, finding is also exhibited on TrueFISP sequence (image 24, series 10). 3. 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 4. Mild ductal distension in the tail of the pancreas, potentially due to mass effect. Attention on follow-up. 5. Critical Value/emergent results were called by telephone at the time of interpretation on 12/19/2018 at 6:43 pm to [place] , who verbally acknowledged these results. Electronically Signed   By: Zetta Bills M.D.   On: 12/19/2018 18:24   US Abdomen Complete  Result Date: 12/19/2018 CLINICAL DATA:  Abdominal pain EXAM: ABDOMEN ULTRASOUND COMPLETE COMPARISON:  MRI earlier today FINDINGS: Gallbladder: Gallbladder is contracted with associated mild wall thickening measuring up to 4 mm. Sludge noted within the gallbladder. No visible stones. Common bile duct: Diameter: Normal caliber, 3 mm Liver: Diffusely heterogeneous echotexture throughout the liver corresponding to the abnormality seen on today's MRI suspicious forinfiltrating mass. Main portal vein is patent on color Doppler imaging with normal direction of blood flow towards the liver. The  area of possible thrombus seen on MRI at the level of the splenic portal confluence not visualized. IVC: No abnormality visualized. Pancreas: Visualized portion unremarkable. Spleen: Size and appearance within normal limits. Right Kidney: Length: 11.0 cm. Echogenicity within normal limits. No mass or hydronephrosis visualized. Left Kidney: Length: 10.5 cm. Echogenicity within normal limits. No mass or  hydronephrosis visualized. Abdominal aorta: No aneurysm visualized. Other findings: None. IMPRESSION: Heterogeneous appearance throughout much of the left hepatic lobe as seen on today's MRI concerning for infiltrating mass/tumor. Main portal vein is patent. Contracted gallbladder with gallbladder wall thickening likely related to contracted state. Sludge within the gallbladder. No sonographic Murphy sign or visible stones. Electronically Signed   By: Rolm Baptise M.D.   On: 12/19/2018 20:35   Ct Abdomen Pelvis W Contrast  Result Date: 12/21/2018 CLINICAL DATA:  48 y.o. female discharged yesterday for concerns of abdominal pain with a work-up regarding her liver as well as she is meeting SIRS criteria with feverPatient comes back today reports she continues to have significant pain and discomfort. She has been having ongoing and severe pain located in the mid to right upper abdomen, also she reports fairly significant pain when she takes a deep breath over the right lower lungShe has been feeling warm had a fever when she was in the hospital yesterday. Some nausea. No headaches. Tested negative for Covid a few days ago pain is located primarily in the right upper abdomen under the right rib cage. EXAM: CT ANGIOGRAPHY CHEST CT ABDOMEN AND PELVIS WITH CONTRAST TECHNIQUE: Multidetector CT imaging of the chest was performed using the standard protocol during bolus administration of intravenous contrast. Multiplanar CT image reconstructions and MIPs were obtained to evaluate the vascular anatomy. Multidetector CT imaging of the abdomen and pelvis was performed using the standard protocol during bolus administration of intravenous contrast. CONTRAST:  19mL OMNIPAQUE IOHEXOL 350 MG/ML SOLN COMPARISON:  Chest CTA and abdomen and pelvis CT dated 12/19/2018. FINDINGS: CTA CHEST FINDINGS Cardiovascular: There is satisfactory opacification of the pulmonary arteries to the segmental level. No evidence of a pulmonary  embolism. Mediastinum/Nodes: No enlarged mediastinal, hilar, or axillary lymph nodes. Thyroid gland, trachea, and esophagus demonstrate no significant findings. Lungs/Pleura: Small bilateral pleural effusions. There is dependent opacity in the lower lobes consistent with atelectasis. Small areas of ground-glass opacity and small lung nodules noted. Dominant nodule in the right upper lobe, image 46, series 4, 6 mm. Several other small nodules are new from the prior CT. Area of ground-glass opacity adjacent to the oblique fissure in the left upper lobe is new. No pneumothorax. Musculoskeletal: No chest wall abnormality. No acute or significant osseous findings. Review of the MIP images confirms the above findings. CT ABDOMEN and PELVIS FINDINGS Hepatobiliary: Enlarged liver. There are multiple hypoattenuating areas throughout the liver. These areas are more apparent and more extensive than on the recent prior CT, particularly evident at the dome of segment 4A, along the anterior aspect of the right lobe and medial segment of the left lobe and along the inferior aspect of the right lobe, segment 5 and 6. Gallbladder is mostly collapsed. There is increased attenuation material within the gallbladder similar to the prior CT. No bile duct dilation Pancreas: Unremarkable. No pancreatic ductal dilatation or surrounding inflammatory changes. Spleen: Normal in size without focal abnormality. Adrenals/Urinary Tract: No adrenal masses. Right kidney displaced inferiorly by the enlarged liver. Symmetric renal enhancement and excretion. No renal masses, stones or hydronephrosis. Normal ureters. Bladder wall is prominent, unchanged from  the recent prior study. No bladder masses. Stomach/Bowel: Stomach is unremarkable, mostly decompressed. Small bowel and colon are normal in caliber. No wall thickening or inflammation. Appendix not discretely seen. No findings of appendicitis. Vascular/Lymphatic: As noted on the prior CT, left  portal vein is attenuated, but does show enhancement. No evidence of thrombosis. There are venous collaterals in the upper abdomen. Aorta is unremarkable. No enlarged lymph nodes. Prominent Peri celiac node noted on the prior study is stable. Reproductive: Status post hysterectomy. No adnexal masses. Other: Small amount of ascites increased when compared to the prior CT. Musculoskeletal: No acute or significant osseous findings. Review of the MIP images confirms the above findings. IMPRESSION: CHEST CTA 1. No evidence of a pulmonary embolism. 2. Small pleural effusions, new from the prior CT. There is increased opacity in the lower lobes that is consistent with atelectasis. There several new small nodules and small areas of ground-glass opacity when compared to the prior CT. Suspect these are inflammatory given change from the recent exam. ABDOMEN AND PELVIS CT 1. Multiple hypoattenuating liver lesions. These appear to have progressed when compared to the prior CT, although this change could be due to differences in contrast timing. The liver is enlarged, stable from the recent exam. Findings are concerning for multifocal hepatocellular carcinoma. Follow-up liver MRI without and with contrast, when the patient can tolerate the procedure, is recommended. 2. Small amount ascites has increased in quantity from the prior study. 3. There are vascular collaterals in the upper abdomen, stable. Electronically Signed   By: Lajean Manes M.D.   On: 12/21/2018 18:36   Ct Abdomen Pelvis W Contrast  Result Date: 12/19/2018 CLINICAL DATA:  Chest pain, complex, intermediate/high prob of ACS/PE/AAS; Abd distension Epigastric pain. Exertional dyspnea. Cough. Weakness. History of breast cancer. EXAM: CT ANGIOGRAPHY CHEST CT ABDOMEN AND PELVIS WITH CONTRAST TECHNIQUE: Multidetector CT imaging of the chest was performed using the standard protocol during bolus administration of intravenous contrast. Multiplanar CT image  reconstructions and MIPs were obtained to evaluate the vascular anatomy. Multidetector CT imaging of the abdomen and pelvis was performed using the standard protocol during bolus administration of intravenous contrast. No reported contrast reaction. CONTRAST:  159mL OMNIPAQUE IOHEXOL 350 MG/ML SOLN COMPARISON:  Abdominal CT without contrast yesterday. Chest CT 01/01/2018 FINDINGS: CTA CHEST FINDINGS Cardiovascular: There are no filling defects within the pulmonary arteries to suggest pulmonary embolus. Thoracic aorta is normal in caliber without dissection. Left vertebral artery arises directly from the thoracic aorta, variant arch anatomy. Heart is normal in size. No pericardial effusion. Right chest port in place with tip in the SVC. Mediastinum/Nodes: No mediastinal or hilar adenopathy. No visualized thyroid nodule. No esophageal wall thickening. Lungs/Pleura: Mild patchy areas of subpleural nodular and ground-glass opacities. Basilar findings are unchanged from CT yesterday. 5 mm right upper lobe pulmonary nodule series 6, image 41. Sub solid nodularity slightly more inferiorly in the right lung series 6, image 45 measuring 8 mm. Tiny 2 mm nodule in the right upper lung series 6, image 32, may be fissural. Pulmonary nodules are new from prior chest CT. Perifissural in subpleural opacity in the right upper lobe, also series 6, image 32, nonspecific. No pulmonary edema. No pleural fluid. Trachea and mainstem bronchi are patent. Musculoskeletal: There are no acute or suspicious osseous abnormalities. Review of the MIP images confirms the above findings. CT ABDOMEN and PELVIS FINDINGS Hepatobiliary: Hepatic parenchyma diffusely heterogeneous with nodular contours. Liver is increased in size from December 2019 CT. Gallbladder is  decompressed and not well assessed, intraluminal high density may be stones or sludge. Questionable wall thickening may be related to nondistention. Pancreas: No ductal dilatation or  inflammation. Spleen: Normal in size without focal abnormality. Adrenals/Urinary Tract: Normal adrenal glands. No hydronephrosis or perinephric edema. Homogeneous renal enhancement with symmetric excretion on delayed phase imaging. Urinary bladder is partially distended, mild bladder wall thickening. Stomach/Bowel: Lack of enteric contrast and paucity of intra-abdominal fat limits detailed bowel assessment. The stomach is nondistended. Questionable pre-pyloric gastric wall thickening. No small bowel obstruction or inflammatory change. Appendix not discretely visualized, no pericecal inflammation to suggest appendicitis. Transverse colon is tortuous. Mild colonic wall thickening at the splenic flexure, colon is decompressed, this is not well assessed. Questionable areas of descending colonic wall thickening, for example series 11, image 61. Vascular/Lymphatic: Right portal vein is patent. The left portal vein is attenuated without evidence of thrombus. Tortuous splenic vein with prominent collaterals in the left upper quadrant and left retroperitoneum. 9 mm perigastric lymph node series 11, image 29, nonspecific. No pelvic adenopathy. Reproductive: Uterus surgically absent. Left ovary tentatively visualized and normal. Right ovary not definitively seen. No obvious adnexal mass. Other: Small volume of pelvic ascites measuring simple fluid density, similar to yesterday. Small perihepatic ascites anteriorly. No free air. Musculoskeletal: There are no acute or suspicious osseous abnormalities. Scattered bone islands in the pelvis. Review of the MIP images confirms the above findings. IMPRESSION: CT chest: 1. No pulmonary embolus. 2. Mild patchy areas of subpleural nodular and ground-glass opacities in both lungs. Slightly more confluent subpleural/perifissural right upper lobe opacity. Findings may be due to atelectasis or infection. 3. Small pulmonary nodules are new from December, infectious/inflammatory or metastatic.  CT abdomen/pelvis: 1. Diffusely heterogeneous liver with nodular contours. Liver has increased in size since December 2019 CT. This may be due to underlying cirrhotic change versus diffuse metastatic disease. Recommend further evaluation with abdominal MRI. Left upper quadrant and retroperitoneal collaterals can be seen with portal hypertension. Left portal vein is attenuated, however no discrete thrombus. 2. Questionable pre-pyloric gastric wall thickening, peptic ulcer disease versus gastritis. 3. Bladder wall thickening, small volume abdominopelvic ascites, and areas of colonic wall thickening at the splenic flexure and descending colon are unchanged from CT yesterday. 4. Gallbladder is decompressed and not well assessed, intraluminal high density may be stones or sludge. Questionable gallbladder wall thickening may be related to nondistention or liver disease. This could be further evaluated with MRI or right upper quadrant ultrasound. Electronically Signed   By: Keith Rake M.D.   On: 12/19/2018 05:33   US Biopsy (liver)  Result Date: 12/20/2018 INDICATION: History of left breast carcinoma with imaging evidence of probable diffuse metastatic disease in the liver, more prominently in the left lobe. The patient presents for biopsy. EXAM: ULTRASOUND GUIDED CORE BIOPSY OF LIVER MEDICATIONS: None. ANESTHESIA/SEDATION: Fentanyl 100 mcg IV; Versed 2.0 mg IV Moderate Sedation Time:  20 minutes. The patient was continuously monitored during the procedure by the interventional radiology nurse under my direct supervision. PROCEDURE: The procedure, risks, benefits, and alternatives were explained to the patient. Questions regarding the procedure were encouraged and answered. The patient understands and consents to the procedure. A time-out was performed prior to initiating the procedure. Ultrasound was used to localize lesions within the liver. The anterior abdominal wall was prepped with chlorhexidine in a  sterile fashion, and a sterile drape was applied covering the operative field. A sterile gown and sterile gloves were used for the procedure. Local anesthesia was  provided with 1% Lidocaine. Under direct ultrasound guidance, a 17 gauge trocar needle was advanced into the left lobe of the liver. After confirming needle tip position, coaxial 18 gauge core biopsy samples were obtained. A total of 3 samples were obtained and submitted in formalin. A slurry of Gel-Foam pledgets was then slowly injected via the outer needle as the needle was retracted. Additional ultrasound was performed. COMPLICATIONS: None immediate. FINDINGS: Ultrasound demonstrates heterogeneous appearance of the liver with ill-defined lesions throughout the left lobe. Solid core biopsy samples were obtained by sampling lesions within the lateral segment of the left lobe. IMPRESSION: Ultrasound-guided core biopsy performed of hepatic lesions within the left lobe. Electronically Signed   By: Aletta Edouard M.D.   On: 12/20/2018 13:44   Dg Chest Portable 1 View  Result Date: 12/21/2018 CLINICAL DATA:  Pleuritic right chest pain EXAM: PORTABLE CHEST 1 VIEW COMPARISON:  12/18/2018 chest radiograph. FINDINGS: Stable right subclavian Port-A-Cath terminating over the right atrium. Stable cardiomediastinal silhouette with normal heart size. No pneumothorax. No pleural effusion. No pulmonary edema. Curvilinear left lung base opacities. No acute consolidative airspace disease. IMPRESSION: Curvilinear left lung base opacities, favor scarring or atelectasis. No acute consolidative airspace disease. Electronically Signed   By: Ilona Sorrel M.D.   On: 12/21/2018 13:06   Dg Chest Portable 1 View  Result Date: 12/18/2018 CLINICAL DATA:  Fever abdominal pain EXAM: PORTABLE CHEST 1 VIEW COMPARISON:  12/31/2017 FINDINGS: Right-sided central venous port with tip over the cavoatrial region. No focal airspace disease or effusion. Normal heart size. No  pneumothorax. IMPRESSION: No active disease. Electronically Signed   By: Donavan Foil M.D.   On: 12/18/2018 19:49   [place] Ascites (abdomen Limited)  Result Date: 12/30/2018 INDICATION: Ascites. EXAM: ULTRASOUND GUIDED PARACENTESIS MEDICATIONS: None. COMPLICATIONS: None immediate. PROCEDURE: Informed written consent was obtained from the patient after a discussion of the risks, benefits and alternatives to treatment. A timeout was performed prior to the initiation of the procedure. Ultrasound reveals a mild amount of ascites. Given the mild amount of ascites and given patient's low platelet count, it was elected to not perform paracentesis at this time. FINDINGS: Mild amount of ascites. Given patient's low platelet count it was not deemed safe to perform paracentesis at this time. Follow-up exam can be obtained if symptoms worsens. IMPRESSION: Mild amount of ascites. Given patient's low platelet count it was not deemed safe to perform paracentesis this time. Electronically Signed   By: Marcello Moores  Register   On: 12/30/2018 14:37      ASSESSMENT/PLAN   Acute hypoxemic respiratory failure -Etiology is not fully elucidated at this time -She is COVID-19 negative, will perform respiratory viral panel to evaluate for other viral lower respiratory tract etiology -COVID-19 has shown to get worse during second and third week of illness even with negative nasopharyngeal swab, will obtain antibody serology testing -Patient is hypoalbuminemic with positive fluid balance making her more susceptible to pulmonary edema with third spacing she also has peripheral edema 1-2+ at the lower extremities, will start Lasix IV 40x1 now - will discontinue sodium chloride infusion -Bibasilar atelectasis is contributing to her hypoxemia will start MetaNeb therapy every 4 hours around-the-clock with saline -There is absence of interlobular septal thickening on CT chest which goes against diagnosis of the lymphangitic  carcinomatosis -Hepatopulmonary syndrome is less likely as it is a chronic condition but may be evaluated with a transthoracic echo with bubble study I will hold off on this for now due to low suspicion -  Agree with empiric antibiotics currently on Cipro 500 twice daily, ID is on case appreciate input -Due to significant desaturation with minimal exertion will request pure wick urinary collection system to be added so that patient does not have to ambulate during acutely ill state.    Severe thrombocytopenia -etiology as per heme/onc team -Patient reported pink-colored urine, discussed with oncologist Dr. Grayland Ormond - will transfuse platelets at this time    Thank you Dr Loleta Books for allowing me to participate in the care of this patient.    Patient/Family are satisfied with care plan and all questions have been answered.   This document was prepared using Dragon voice recognition software and may include unintentional dictation errors.     Ottie Glazier, M.D.  Division of [place]

## 2019-01-04 DIAGNOSIS — R000 Tachycardia, unspecified: Secondary | ICD-10-CM

## 2019-01-04 LAB — COMPREHENSIVE METABOLIC PANEL
ALT: 285 U/L — ABNORMAL HIGH (ref 0–44)
ALT: 314 U/L — ABNORMAL HIGH (ref 0–44)
AST: 580 U/L — ABNORMAL HIGH (ref 15–41)
AST: 620 U/L — ABNORMAL HIGH (ref 15–41)
Albumin: 2.9 g/dL — ABNORMAL LOW (ref 3.5–5.0)
Albumin: 2.8 g/dL — ABNORMAL LOW (ref 3.5–5.0)
Alkaline Phosphatase: 219 U/L — ABNORMAL HIGH (ref 38–126)
Alkaline Phosphatase: 212 U/L — ABNORMAL HIGH (ref 38–126)
Anion gap: 14 (ref 5–15)
Anion gap: 12 (ref 5–15)
BUN: 35 mg/dL — ABNORMAL HIGH (ref 6–20)
BUN: 37 mg/dL — ABNORMAL HIGH (ref 6–20)
CO2: 23 mmol/L (ref 22–32)
CO2: 25 mmol/L (ref 22–32)
Calcium: 8.6 mg/dL — ABNORMAL LOW (ref 8.9–10.3)
Calcium: 8.4 mg/dL — ABNORMAL LOW (ref 8.9–10.3)
Chloride: 93 mmol/L — ABNORMAL LOW (ref 98–111)
Chloride: 93 mmol/L — ABNORMAL LOW (ref 98–111)
Creatinine, Ser: 0.55 mg/dL (ref 0.44–1.00)
Creatinine, Ser: 0.51 mg/dL (ref 0.44–1.00)
GFR calc Af Amer: >60 mL/min (ref >60)
GFR calc Af Amer: >60 mL/min (ref >60)
GFR calc non Af Amer: >60 mL/min (ref >60)
GFR calc non Af Amer: >60 mL/min (ref >60)
Glucose, Bld: 152 mg/dL — ABNORMAL HIGH (ref 70–99)
Glucose, Bld: 158 mg/dL — ABNORMAL HIGH (ref 70–99)
Potassium: 5 mmol/L (ref 3.5–5.1)
Potassium: 5.2 mmol/L — ABNORMAL HIGH (ref 3.5–5.1)
Sodium: 130 mmol/L — ABNORMAL LOW (ref 135–145)
Sodium: 130 mmol/L — ABNORMAL LOW (ref 135–145)
Total Bilirubin: 2.4 mg/dL — ABNORMAL HIGH (ref 0.3–1.2)
Total Bilirubin: 2.6 mg/dL — ABNORMAL HIGH (ref 0.3–1.2)
Total Protein: 5.8 g/dL — ABNORMAL LOW (ref 6.5–8.1)
Total Protein: 5.7 g/dL — ABNORMAL LOW (ref 6.5–8.1)

## 2019-01-04 LAB — BPAM PLATELET PHERESIS
Blood Product Expiration Date: 202012062359
ISSUE DATE / TIME: 202012041753
Unit Type and Rh: 7300

## 2019-01-04 LAB — CBC
HCT: 34.6 % — ABNORMAL LOW (ref 36.0–46.0)
HCT: 35.6 % — ABNORMAL LOW (ref 36.0–46.0)
Hemoglobin: 12 g/dL (ref 12.0–15.0)
Hemoglobin: 12.4 g/dL (ref 12.0–15.0)
MCH: 31.7 pg (ref 26.0–34.0)
MCH: 31.7 pg (ref 26.0–34.0)
MCHC: 34.7 g/dL (ref 30.0–36.0)
MCHC: 34.8 g/dL (ref 30.0–36.0)
MCV: 91 fL (ref 80.0–100.0)
MCV: 91.3 fL (ref 80.0–100.0)
Platelets: 19 10*3/uL — CL (ref 150–400)
Platelets: 26 10*3/uL — CL (ref 150–400)
RBC: 3.79 MIL/uL — ABNORMAL LOW (ref 3.87–5.11)
RBC: 3.91 MIL/uL (ref 3.87–5.11)
RDW: 18 % — ABNORMAL HIGH (ref 11.5–15.5)
RDW: 18.1 % — ABNORMAL HIGH (ref 11.5–15.5)
WBC: 19.8 10*3/uL — ABNORMAL HIGH (ref 4.0–10.5)
WBC: 20 10*3/uL — ABNORMAL HIGH (ref 4.0–10.5)
nRBC: 0.1 % (ref 0.0–0.2)
nRBC: 0.2 % (ref 0.0–0.2)

## 2019-01-04 LAB — PREPARE PLATELET PHERESIS: Unit division: 0

## 2019-01-04 LAB — BLOOD GAS, ARTERIAL
Acid-Base Excess: 1.5 mmol/L (ref 0.0–2.0)
Bicarbonate: 24.7 mmol/L (ref 20.0–28.0)
FIO2: 0.6
O2 Saturation: 89.3 %
Patient temperature: 37
pCO2 arterial: 34 mmHg (ref 32.0–48.0)
pH, Arterial: 7.47 — ABNORMAL HIGH (ref 7.350–7.450)
pO2, Arterial: 53 mmHg — ABNORMAL LOW (ref 83.0–108.0)

## 2019-01-04 LAB — MAGNESIUM: Magnesium: 2.2 mg/dL (ref 1.7–2.4)

## 2019-01-04 LAB — PHOSPHORUS: Phosphorus: 3.6 mg/dL (ref 2.5–4.6)

## 2019-01-04 LAB — LACTIC ACID, PLASMA
Lactic Acid, Venous: 5.4 mmol/L (ref 0.5–1.9)
Lactic Acid, Venous: 4.5 mmol/L (ref 0.5–1.9)

## 2019-01-04 LAB — TROPONIN I (HIGH SENSITIVITY)
Troponin I (High Sensitivity): 460 ng/L (ref <18)
Troponin I (High Sensitivity): 465 ng/L (ref <18)

## 2019-01-04 LAB — PROTIME-INR
INR: 1.4 — ABNORMAL HIGH (ref 0.8–1.2)
Prothrombin Time: 16.6 seconds — ABNORMAL HIGH (ref 11.4–15.2)

## 2019-01-04 LAB — SARS CORONAVIRUS 2 BY RT PCR (HOSPITAL ORDER, PERFORMED IN ~~LOC~~ HOSPITAL LAB): SARS Coronavirus 2: NEGATIVE

## 2019-01-04 LAB — GLUCOSE, CAPILLARY: Glucose-Capillary: 168 mg/dL — ABNORMAL HIGH (ref 70–99)

## 2019-01-04 LAB — PROCALCITONIN: Procalcitonin: 1.92 ng/mL

## 2019-01-04 MED ORDER — EPINEPHRINE PF 1 MG/ML IJ SOLN
INTRAMUSCULAR | Status: AC
  Filled 2019-01-04: qty 3

## 2019-01-04 MED ORDER — ALBUMIN HUMAN 25 % IV SOLN
25.0000 g | Freq: Once | INTRAVENOUS | Status: AC
  Administered 2019-01-04: 25 g via INTRAVENOUS
  Filled 2019-01-04: qty 50

## 2019-01-04 MED ORDER — MORPHINE SULFATE (PF) 2 MG/ML IV SOLN
INTRAVENOUS | Status: AC
  Filled 2019-01-04: qty 1

## 2019-01-04 MED ORDER — IPRATROPIUM-ALBUTEROL 0.5-2.5 (3) MG/3ML IN SOLN
3.0000 mL | Freq: Four times a day (QID) | RESPIRATORY_TRACT | Status: DC
  Administered 2019-01-04: 3 mL via RESPIRATORY_TRACT
  Filled 2019-01-04: qty 3

## 2019-01-04 MED ORDER — SODIUM CHLORIDE 0.9 % IV SOLN
2.0000 g | Freq: Three times a day (TID) | INTRAVENOUS | Status: DC
  Filled 2019-01-04 (×2): qty 2

## 2019-01-04 MED ORDER — MORPHINE SULFATE (PF) 2 MG/ML IV SOLN
2.0000 mg | INTRAVENOUS | Status: AC
  Administered 2019-01-04: 2 mg via INTRAVENOUS

## 2019-01-04 MED ORDER — MORPHINE SULFATE (PF) 2 MG/ML IV SOLN: 2.0000 mg | INTRAVENOUS | Status: DC | PRN

## 2019-01-04 MED ORDER — FUROSEMIDE 10 MG/ML IJ SOLN
40.0000 mg | Freq: Once | INTRAMUSCULAR | Status: AC
  Administered 2019-01-04: 40 mg via INTRAVENOUS
  Filled 2019-01-04: qty 4

## 2019-01-04 MED ORDER — IPRATROPIUM-ALBUTEROL 0.5-2.5 (3) MG/3ML IN SOLN
RESPIRATORY_TRACT | Status: AC
  Filled 2019-01-04: qty 3

## 2019-01-04 MED ORDER — SODIUM CHLORIDE 0.9 % IV SOLN
0.0000 ug/min | INTRAVENOUS | Status: DC
  Filled 2019-01-04: qty 4

## 2019-01-04 MED ORDER — EPINEPHRINE PF 1 MG/ML IJ SOLN
INTRAMUSCULAR | Status: AC
  Filled 2019-01-04: qty 1

## 2019-01-04 MED ORDER — SODIUM CHLORIDE 0.9% IV SOLUTION: Freq: Once | INTRAVENOUS | Status: DC

## 2019-01-04 MED ORDER — SODIUM CHLORIDE 0.9 % IV SOLN
1.0000 g | Freq: Three times a day (TID) | INTRAVENOUS | Status: DC
  Administered 2019-01-04: 1 g via INTRAVENOUS
  Filled 2019-01-04 (×3): qty 1

## 2019-01-04 MED ORDER — FAMOTIDINE IN NACL 20-0.9 MG/50ML-% IV SOLN: 20.0000 mg | INTRAVENOUS | Status: DC

## 2019-01-04 MED ORDER — FENTANYL 2500MCG IN NS 250ML (10MCG/ML) PREMIX INFUSION
INTRAVENOUS | Status: AC
  Filled 2019-01-04: qty 250

## 2019-01-04 MED ORDER — SODIUM CHLORIDE 0.9 % IV SOLN
100.0000 mg | Freq: Two times a day (BID) | INTRAVENOUS | Status: DC
  Administered 2019-01-04: 100 mg via INTRAVENOUS
  Filled 2019-01-04 (×3): qty 100

## 2019-01-04 MED FILL — Medication: Qty: 1 | Status: AC

## 2019-01-05 LAB — PREPARE PLATELET PHERESIS: Unit division: 0

## 2019-01-05 LAB — BPAM PLATELET PHERESIS
Blood Product Expiration Date: 202012072359
ISSUE DATE / TIME: 202012050509
Unit Type and Rh: 6200

## 2019-01-06 ENCOUNTER — Other Ambulatory Visit: Payer: Self-pay | Admitting: *Deleted

## 2019-01-06 NOTE — Patient Outreach (Addendum)
[facility]) Care Management  01/06/2019  MARYEMMA SIEW [id number]  Transition of Care  Referral Received : 12/19/25 Insurance: [place] , [place]    Patient admitted 12/18/18- 12/20/18 at [facility] Readmitted 12/21/18-12/27/18 at [facility]  Readmitted 12/02/2018 at [facility]   Case Closure    Per electronic record, noted patient death on January 30, 2019 at 0832 at [facility] .   Plan  Will close case to [place] Management .    Joylene Draft, RN, [place] Management Coordinator  [phone]- Mobile [phone]- Toll Free Main Office

## 2019-01-07 LAB — FUNGITELL, SERUM: Fungitell Result: <31 pg/mL (ref <80)

## 2019-01-09 LAB — CULTURE, BLOOD (ROUTINE X 2)
Culture: NO GROWTH
Culture: NO GROWTH
Special Requests: ADEQUATE
Special Requests: ADEQUATE

## 2019-01-31 NOTE — Plan of Care (Signed)
  Problem: Education: Goal: Knowledge of the prescribed therapeutic regimen will improve Outcome: Progressing   Problem: Activity: Goal: Ability to implement measures to reduce episodes of fatigue will improve Outcome: Not Progressing   Problem: Bowel/Gastric: Goal: Will not experience complications related to bowel motility Outcome: Not Progressing   Problem: Coping: Goal: Ability to identify and develop effective coping behavior will improve Outcome: Not Progressing   Problem: Nutritional: Goal: Maintenance of adequate nutrition will improve Outcome: Not Progressing   Problem: Education: Goal: Knowledge of General Education information will improve Description: Including pain rating scale, medication(s)/side effects and non-pharmacologic comfort measures Outcome: Not Progressing   Problem: Health Behavior/Discharge Planning: Goal: Ability to manage health-related needs will improve Outcome: Not Progressing   Problem: Clinical Measurements: Goal: Ability to maintain clinical measurements within normal limits will improve Outcome: Not Progressing Goal: Will remain free from infection Outcome: Not Progressing Goal: Diagnostic test results will improve Outcome: Not Progressing Goal: Respiratory complications will improve Outcome: Not Progressing   Problem: Activity: Goal: Risk for activity intolerance will decrease Outcome: Not Progressing   Problem: Elimination: Goal: Will not experience complications related to bowel motility Outcome: Not Progressing Goal: Will not experience complications related to urinary retention Outcome: Progressing   Problem: Pain Managment: Goal: General experience of comfort will improve Outcome: Not Progressing   Problem: Safety: Goal: Ability to remain free from injury will improve Outcome: Progressing   Problem: Skin Integrity: Goal: Risk for impaired skin integrity will decrease Outcome: Progressing

## 2019-01-31 NOTE — Progress Notes (Signed)
ETT removed per MD order after CODE had ended.

## 2019-01-31 NOTE — Progress Notes (Signed)
   February 01, 2019 1000  Clinical Encounter Type  Visited With Family  Visit Type Death  Referral From Chaplain  Consult/Referral To Chaplain  Spiritual Encounters  Spiritual Needs Emotional;Grief support  Stress Factors  Family Stress Factors Loss  Chaplain arrived at patient's room at the time of code and made pastoral presence known with silent prayer. The family arrived and Chaplain assist with comforting, encouragement. The family was in great pain o patient transition. Chaplain was able to connect with patient older son Morene Antu) The family was really appreciative for Chaplain's stay with them at the moment of the loss

## 2019-01-31 NOTE — Progress Notes (Signed)
CRITICAL VALUE ALERT  Critical Value:  Lactic Acid 5.4  Date & Time Notied:  01-20-2019 0110  Provider Notified: Madgdalene NP  Orders Received/Actions taken: New orders received

## 2019-01-31 NOTE — Progress Notes (Signed)
ABG RESULTS SHOWN TO M.TUKOV NP.ORDER TO FIO2 TO 75%

## 2019-01-31 NOTE — Death Summary Note (Signed)
Expiration Note/ Death Summary  [name]  MR#: [id number]  DOB:1971-01-15  Date of Admission: 2019-01-06 Date of Death: 01-12-19  Attending [place]  Patient's PCP: Jerrol Banana., MD  Consults: Treatment Team:  Lloyd Huger, MD Ottie Glazier, MD  Cause of Death: Respiratory failure due to metastatic breast cancer  Secondary Diagnoses Present on Admission: . GERD (gastroesophageal reflux disease) . Hypertension . Liver mass . Pancytopenia ([place]) . Transaminitis    Initial history: Shelby [name] is a 49 y.o. F with metastatic triple negative breast cancer, HTN who presents with abdominal pain, distention and nausea.  Patient was admitted here few days ago and discharged on 11/27.After going home she continued report of mid abdominal bloating, fullness and pain with poor appetite. This continued to progress therefore came back to the hospital may be subjective chills but denies any fevers and other complaints.  During her prior admission she was having some fever and it was thought to be secondary to tumor burden and she was started on steroids.  During this readmission she was having leukocytosis, lactic acidosis with abdominal pain and ascites. CT abdomen with widespread metastatic disease to liver. Due to her worsening pain it was thought to be due to SBP and she was started on Zosyn.  Urine culture grew 10,000 colonies of Klebsiella with ESBL positive.      Hospital Course: Patient was admitted and started on empiric antibiotics for possible SBP.  Paracentesis was attempted for diagnostic purposes early in hospital stay, but not enough fluid, and platelets too low to attempt to safely and so this was deferred.  Ultimately, SBP and sepsis were doubted.    ID were consulted, who recommended Ciprofloxacin for UTI and she completed 3 days.  While in the hospital, she was started on chemotherapy with Eribulin after  thorough discussion of risks and benefits.  She had some worsening of her baseline thrombocytopenia, with mucosal bleeding and platelets were transfused.  Through her hospitalization, her O2 saturations continuously worsened, and she had desaturations to 60% with ambulation, even on 4L O2.  Pulmonology were consulted who recommended trial Lasix and viral panel.  Lymphangitic carcinomatosis was doubted, possibly malignancy related or chemo related ARDS.  The patient's O2 saturations and heart rate increased overnight her last night and she was transferred to ICU.  She was started on high flow nasal cannula and antibiotics were broadened, but patient then suffered bradycardia and cardiorespiratory collapse.  ACLS was attempted, but no pulse could be recovered, and Shelby [name] passed away at 8:32 AM on 01/12/19.     Signed:  Edwin Dada M.D. [facility] 01-12-2019, 8:56 AM

## 2019-01-31 NOTE — ED Provider Notes (Signed)
[name] Department of Emergency Medicine   Code Blue CONSULT NOTE  Chief Complaint: Cardiac arrest/unresponsive   Level V Caveat: Unresponsive  History of present illness: I was contacted by the hospital for a CODE BLUE cardiac arrest upstairs and presented to the patient's bedside. At the time of my arrival the hospitalist MD and critical care APP were at bedside running the CODE BLUE.   In short, patient is a 49 year old female with history of triple negative metastatic breast cancer who presented on 12/13/2018 for abdominal pain, nausea, abdominal distention.  Found in the ER to have an elevated WBC, lactic acidosis, abdominal pain with ascites.  Imaging with moderate ascites and widespread metastatic disease to the liver.  Initiated broad-spectrum antibiotics.  Per report, patient developed severe bradycardia and arrested. On arrival, chest compressions/CODE in progress. Patient being bagged by RT.    ROS: Unable to obtain, Level V caveat  Scheduled Meds: . EPINEPHrine      . sodium chloride   Intravenous Once  . Chlorhexidine Gluconate Cloth  6 each Topical Daily  . cholecalciferol  2,000 Units Oral QPM  . dexamethasone  8 mg Oral Q8H  . EPINEPHrine      . feeding supplement (ENSURE ENLIVE)  237 mL Oral BID BM  . fentaNYL  1 patch Transdermal Q72H  . furosemide  40 mg Intravenous Daily  . insulin aspart  0-9 Units Subcutaneous TID WC  . ipratropium-albuterol  3 mL Nebulization Q6H  . ipratropium-albuterol      . losartan  50 mg Oral Daily  . multivitamin with minerals  1 tablet Oral Daily  . omega-3 acid ethyl esters  1,000 mg Oral QPM  . pantoprazole  40 mg Oral Daily  . polyethylene glycol  17 g Oral Daily  . sodium chloride flush  10 mL Intravenous q morning - 10a  . sucralfate  1 g Oral TID WC & HS   Continuous Infusions: . sodium chloride Stopped (01/02/19 1128)  . doxycycline (VIBRAMYCIN) IV 100 mg (01/30/19 0155)  . famotidine (PEPCID) IV     . fentaNYL    . meropenem (MERREM) IV 1 g (2019-01-30 0405)  . norepinephrine (LEVOPHED) Adult infusion     PRN Meds:.sodium chloride, ALPRAZolam, bisacodyl, fentaNYL (SUBLIMAZE) injection, HYDROmorphone, morphine injection, ondansetron **OR** ondansetron (ZOFRAN) IV, simethicone, sodium chloride flush, traMADol Past Medical History:  Diagnosis Date  . Anemia    h/o with pregnancy  . Anxiety   . Asthma    allergy induced-no inhaler  . Cancer [facility])    breast left   . Complication of anesthesia   . Environmental allergies   . Family history of adverse reaction to anesthesia    son-breathing problems-coded 1st time when he was 1 and had another surgery at 29 and had to be admitted for breathing problems  . Family history of breast cancer   . GERD (gastroesophageal reflux disease)   . Headache    migraines  . Hypertension   . Mitral valve disease   . Mitral valve prolapse   . Painful menstrual periods   . PONV (postoperative nausea and vomiting)   . Vertigo    Past Surgical History:  Procedure Laterality Date  . ABDOMINAL HYSTERECTOMY  2010   supracervical   . AXILLARY LYMPH NODE DISSECTION Left 07/12/2018   Procedure: AXILLARY LYMPH NODE DISSECTION;  Surgeon: Robert Bellow, MD;  Location: [facility] ORS;  Service: General;  Laterality: Left;  . BREAST BIOPSY Left 11/2017  invasive ductal carcinoma and metastatic LN  . BREAST BIOPSY WITH SENTINEL LYMPH NODE BIOPSY AND NEEDLE LOCALIZATION Left 07/12/2018   Procedure: BREAST BIOPSY WIDE EXCISION WITH SENTINEL NODE  AND NEEDLE LOCALIZATION OF OXILLARY NODS LEFT;  Surgeon: Robert Bellow, MD;  Location: [facility] ORS;  Service: General;  Laterality: Left;  . BUNIONECTOMY Bilateral   . MANDIBLE RECONSTRUCTION     age 77-underbite  . PORTACATH PLACEMENT Right 12/31/2017   Procedure: INSERTION PORT-A-CATH;  Surgeon: Robert Bellow, MD;  Location: [facility] ORS;  Service: General;  Laterality: Right;  . RE-EXCISION OF BREAST LUMPECTOMY  Left 07/24/2018   Procedure: RE-EXCISION OF BREAST LUMPECTOMY LEFT;  Surgeon: Robert Bellow, MD;  Location: [facility] ORS;  Service: General;  Laterality: Left;  . SHOULDER ARTHROSCOPY WITH OPEN ROTATOR CUFF REPAIR Right 04/26/2017   Procedure: SHOULDER ARTHROSCOPY WITH OPEN ROTATOR CUFF REPAIR,SUBACROMINAL DECOMPRESSION;  Surgeon: Thornton Park, MD;  Location: [facility] ORS;  Service: Orthopedics;  Laterality: Right;  . TONSILLECTOMY     Social History   Socioeconomic History  . Marital status: Divorced    Spouse name: Not on file  . Number of children: Not on file  . Years of education: Not on file  . Highest education level: Not on file  Occupational History  . Not on file  Social Needs  . Financial resource strain: Not on file  . Food insecurity    Worry: Not on file    Inability: Not on file  . Transportation needs    Medical: Not on file    Non-medical: Not on file  Tobacco Use  . Smoking status: Former Smoker    Packs/day: 0.50    Years: 10.00    Pack years: 5.00    Types: Cigarettes    Quit date: 04/20/2007    Years since quitting: 11.7  . Smokeless tobacco: Never Used  . Tobacco comment: social smoker back then  Substance and Sexual Activity  . Alcohol use: Yes    Comment: occas  . Drug use: No  . Sexual activity: Yes  Lifestyle  . Physical activity    Days per week: Not on file    Minutes per session: Not on file  . Stress: Not on file  Relationships  . Social Herbalist on phone: Not on file    Gets together: Not on file    Attends religious service: Not on file    Active member of club or organization: Not on file    Attends meetings of clubs or organizations: Not on file    Relationship status: Not on file  . Intimate partner violence    Fear of current or ex partner: Not on file    Emotionally abused: Not on file    Physically abused: Not on file    Forced sexual activity: Not on file  Other Topics Concern  . Not on file  Social History  Narrative  . Not on file   Allergies  Allergen Reactions  . Hydrocodone Hives    Swollen face - many years ago. Thinks she has tolerated since.  . Iodine Hives  . Peanut Butter Flavor Other (See Comments)    Made mouth tingle  . Shellfish Allergy Hives  . Vancomycin Other (See Comments)    Itching and forehead turned red    Last set of Vital Signs (not current) Vitals:   29-Jan-2019 0730 01/29/2019 0748  BP: (!) 110/95   Pulse: (!) 126   Resp: (!) 25  Temp:    SpO2: (!) 83% (!) 79%      Physical Exam  Gen: unresponsive Cardiovascular: pulseless  Resp: Rare, agonal breaths between bagging. Breath sounds equal bilaterally with bagging. Abd: Nondistended  Neuro: GCS 3, unresponsive to pain  HEENT: No blood in posterior pharynx, gag reflex absent  Neck: No crepitus  Musculoskeletal: No deformity  Skin: Warm  Procedures (when applicable, including Critical Care time): Procedure Name: Intubation Date/Time: 09-Jan-2019 8:56 AM Performed by: Lilia Pro., MD Pre-anesthesia Checklist: Patient identified, Patient being monitored, Emergency Drugs available, Suction available and Timeout performed Oxygen Delivery Method: Ambu bag Preoxygenation: Pre-oxygenation with 100% oxygen Ventilation: Mask ventilation without difficulty Laryngoscope Size: Glidescope and 3 Tube size: 7.5 mm Number of attempts: 1 Airway Equipment and Method: Video-laryngoscopy and Stylet Placement Confirmation: ETT inserted through vocal cords under direct vision,  Breath sounds checked- equal and bilateral and Positive ETCO2 Secured at: 25 cm Tube secured with: ETT holder Comments: Patient intubated during pulse check. No RSI medications required. Positive end tidal and bilateral breath sounds with bagging. Oxygen 100% with adequate compressions.      MDM / Assessment and Plan:   #Bradycardic arrest  In short, patient is a 49 year old female with history of triple negative metastatic breast cancer  who presented on 12/01/2018 for abdominal pain, nausea, abdominal distention.  Found in the ER to have an elevated WBC, lactic acidosis, abdominal pain with ascites.  Imaging with moderate ascites and widespread metastatic disease to the liver.  Initiated broad-spectrum antibiotics.  Per report, patient developed severe bradycardia and arrested. On arrival, chest compressions/CODE in progress. Patient being bagged by RT.   Confirmed patient full code with hospitalist and critical care APP. Patient briefly achieved ROSC, noted to have agonal respirations, GCS remained 3. Quickly lost pulses again. Proceeded with intubation as above. No immediate complications, positive end tidal and bilateral breath sounds with bagging. Oxygen 100% with adequate compressions. CODE continued by ICU MD, hospitalist.    Lilia Pro., MD 09-Jan-[phone]

## 2019-01-31 NOTE — Progress Notes (Addendum)
PULMONARY / CRITICAL CARE MEDICINE  Name: Shelby [name] MRN: [id number] DOB: December 16, 1970    LOS: 6  Admitting provider: Dr. Criselda Peaches Reason for consult: Acute hypoxic respiratory failure  HPI: This is a 49 year old Caucasian female with stage IV triple negative breast cancer with liver metastases that was recently diagnosed, who presented with worsening abdominal pain pain, distention, nausea and anorexia.  At the time she also reported fever and chills that have since resolved.  Her ED work-up revealed slightly elevated WBC and LFTs and low platelet count of 26.  Her lactate dehydrogenase level was also elevated as well as her ferritin.  Her Covid test was negative.  She was admitted to the floor and was seen by oncology, infectious disease and palliative care.  She is currently on palliative chemo with [place].  Abdominal pain and increased white, and ascites with thought to be due to increase tumor burden and possibly spontaneous bacterial peritonitis.  A paracentesis was attempted in the ED but aborted.  She was started on IV Zosyn and switched to ceftriaxone.  Her urine culture showed ESBL with Klebsiella with bladder wall thickening on CT and she was seen by infectious disease and started on ciprofloxacin for 3 days.  PCCM was consulted on 01/03/2019 due to increasing oxygen needs.  She was started on supplemental oxygen via nasal cannula and overnight, she decompensated and developed severe sinus tachycardia, hypoxemia and worsening abdominal pain and dyspnea.  She was transferred to the ICU for further management. Her repeat chest x-ray showed multifocal pneumonia.  Her Covid test was repeated and was negative.  Her repeat labs showed a potassium of 5.2, worsening LFTs, lactic acidosis with a lactic acid level of 5.4 from 2.4, albumin level of 2.9, troponin of 460, WBC of 20,000, platelets of 26 and a PO2 of 53 on ABG.  She was placed on high flow nasal cannula at 100% FiO2, her antibiotics  were broadened to meropenem, she was given Albumin and Lasix and morphine for pain and dyspnea.  Heart rate improved from 150 to 127 bpm.  EKG showed sinus tach without any ST changes.   SIGNIFICANT EVENTS: 12/06/2018: Readmitted Feb 02, 2019: Transferred to the ICU for worsening hypoxemia   Past Medical History:  Diagnosis Date  . Anemia    h/o with pregnancy  . Anxiety   . Asthma    allergy induced-no inhaler  . Cancer [facility])    breast left   . Complication of anesthesia   . Environmental allergies   . Family history of adverse reaction to anesthesia    son-breathing problems-coded 1st time when he was 2 and had another surgery at 13 and had to be admitted for breathing problems  . Family history of breast cancer   . GERD (gastroesophageal reflux disease)   . Headache    migraines  . Hypertension   . Mitral valve disease   . Mitral valve prolapse   . Painful menstrual periods   . PONV (postoperative nausea and vomiting)   . Vertigo    Past Surgical History:  Procedure Laterality Date  . ABDOMINAL HYSTERECTOMY  2010   supracervical   . AXILLARY LYMPH NODE DISSECTION Left 07/12/2018   Procedure: AXILLARY LYMPH NODE DISSECTION;  Surgeon: Robert Bellow, MD;  Location: [facility] ORS;  Service: General;  Laterality: Left;  . BREAST BIOPSY Left 11/2017   invasive ductal carcinoma and metastatic LN  . BREAST BIOPSY WITH SENTINEL LYMPH NODE BIOPSY AND NEEDLE LOCALIZATION Left 07/12/2018  Procedure: BREAST BIOPSY WIDE EXCISION WITH SENTINEL NODE  AND NEEDLE LOCALIZATION OF OXILLARY NODS LEFT;  Surgeon: Robert Bellow, MD;  Location: [facility] ORS;  Service: General;  Laterality: Left;  . BUNIONECTOMY Bilateral   . MANDIBLE RECONSTRUCTION     age 32-underbite  . PORTACATH PLACEMENT Right 12/31/2017   Procedure: INSERTION PORT-A-CATH;  Surgeon: Robert Bellow, MD;  Location: [facility] ORS;  Service: General;  Laterality: Right;  . RE-EXCISION OF BREAST LUMPECTOMY Left 07/24/2018    Procedure: RE-EXCISION OF BREAST LUMPECTOMY LEFT;  Surgeon: Robert Bellow, MD;  Location: [facility] ORS;  Service: General;  Laterality: Left;  . SHOULDER ARTHROSCOPY WITH OPEN ROTATOR CUFF REPAIR Right 04/26/2017   Procedure: SHOULDER ARTHROSCOPY WITH OPEN ROTATOR CUFF REPAIR,SUBACROMINAL DECOMPRESSION;  Surgeon: Thornton Park, MD;  Location: [facility] ORS;  Service: Orthopedics;  Laterality: Right;  . TONSILLECTOMY     No current facility-administered medications on file prior to encounter.    Current Outpatient Medications on File Prior to Encounter  Medication Sig  . ALPRAZolam (XANAX) 0.5 MG tablet Take 0.5-1 tablets (0.25-0.5 mg total) by mouth 2 (two) times daily as needed for anxiety.  . Cholecalciferol (VITAMIN D) 50 MCG (2000 UT) CAPS Take 2,000 Units by mouth every evening.   Marland Kitchen dexamethasone (DECADRON) 4 MG tablet Take 2 tablets (8 mg total) by mouth every 8 (eight) hours for 28 days.  . famotidine (PEPCID) 20 MG tablet Take 20 mg by mouth daily as needed for heartburn.   Marland Kitchen HYDROmorphone (DILAUDID) 2 MG tablet Take 0.5 tablets (1 mg total) by mouth every 4 (four) hours as needed for up to 14 days for severe pain (cancer associated pain).  Marland Kitchen ibuprofen (ADVIL,MOTRIN) 200 MG tablet Take 200 mg by mouth daily as needed for mild pain.   Marland Kitchen losartan (COZAAR) 50 MG tablet TAKE 1 TABLET BY MOUTH DAILY (Patient taking differently: Take 50 mg by mouth every evening. )  . Omega-3 1000 MG CAPS Take 1,000 mg by mouth every evening.   . sucralfate (CARAFATE) 1 g tablet Take 1 tablet (1 g total) by mouth 4 (four) times daily -  with meals and at bedtime.  . [DISCONTINUED] prochlorperazine (COMPAZINE) 10 MG tablet Take 1 tablet (10 mg total) by mouth every 6 (six) hours as needed (Nausea or vomiting).    Allergies Allergies  Allergen Reactions  . Hydrocodone Hives    Swollen face - many years ago. Thinks she has tolerated since.  . Iodine Hives  . Peanut Butter Flavor Other (See Comments)    Made  mouth tingle  . Shellfish Allergy Hives  . Vancomycin Other (See Comments)    Itching and forehead turned red    Family History Family History  Problem Relation Age of Onset  . Breast cancer Mother 42  . Diabetes Father   . Cancer Maternal Uncle        colon  . Breast cancer Maternal Grandmother 44   Social History  reports that she quit smoking about 11 years ago. Her smoking use included cigarettes. She has a 5.00 pack-year smoking history. She has never used smokeless tobacco. She reports current alcohol use. She reports that she does not use drugs.  Review Of Systems:   Constitutional: Positive for night sweats and chills.  HENT: Negative for congestion and rhinorrhea.  Eyes: Negative for redness and visual disturbance.  Respiratory: Positive for shortness of breath but negative for wheezing Cardiovascular: Negative for chest pain and palpitations but positive for lower extremity edema.  Gastrointestinal: Positive for nausea, anorexia abdominal pain and distention Genitourinary: Negative for dysuria and urgency.  Endocrine: Denies polyuria, polyphagia and heat intolerance Musculoskeletal: Negative for myalgias and arthralgias But positive for generalized weakness Skin: Negative for pallor and wound.  Neurological: Negative for dizziness and headaches   VITAL SIGNS: BP 95/75 (BP Location: Right Arm)   Pulse (!) 134   Temp 97.7 F (36.5 C) (Oral)   Resp (!) 24   Ht 5\' 6"  (1.676 m)   Wt 69.1 kg   SpO2 95%   BMI 24.59 kg/m   HEMODYNAMICS:    VENTILATOR SETTINGS: FiO2 (%):  [60 %] 60 %  INTAKE / OUTPUT: I/O last 3 completed shifts: In: 18.9 [I.V.:18.9] Out: 700 [Urine:700]  PHYSICAL EXAMINATION: General: Acutely ill looking, in moderate respiratory distress HEENT: PERRLA, trachea midline, no JVD Neuro: Alert and oriented x4, no focal deficits Cardiovascular: Pulse regular, tachycardic with heart rate fluctuating between 145 and 156 beats per minute, S1-S2, no  murmur regurg or gallop, +2 pulses bilaterally, +1 nonpitting edema Lungs: Increased work of breathing, bilateral breath sounds, diminished in the bases, no wheezes or rhonchi Abdomen: Distended, hypoactive bowel sounds, increased liver span, pain with gentle palpation Musculoskeletal: Positive range of motion, no joint deformity Skin: Warm and dry  LABS:  BMET Recent Labs  Lab 12/31/18 0514 01/02/19 0534 01/03/19 0457  NA 131* 132* 132*  K 4.8 4.8 4.7  CL 93* 93* 93*  CO2 27 29 27   BUN 17 24* 26*  CREATININE 0.58 0.51 0.49  GLUCOSE 147* 132* 139*    Electrolytes Recent Labs  Lab 12/31/18 0514 01/02/19 0534 01/03/19 0457 01/03/19 1107  CALCIUM 8.5* 8.7* 8.6*  --   MG  --   --   --  2.0    CBC Recent Labs  Lab 01/02/19 0534 01/03/19 0457 2019/01/09 0021  WBC 15.2* 20.7* 20.0*  HGB 11.8* 12.4 12.4  HCT 34.3* 34.9* 35.6*  PLT 26* 20* 26*    Coag's Recent Labs  Lab 12/30/18 0530 01-09-2019 0021  APTT 29  --   INR 1.2 1.4*    Sepsis Markers Recent Labs  Lab 12/07/2018 1113 12/18/2018 1319 01/03/19 1107  LATICACIDVEN 2.8* 2.4*  --   PROCALCITON  --   --  1.54    ABG Recent Labs  Lab 01/03/19 2357  PHART 7.47*  PCO2ART 34  PO2ART 53*    Liver Enzymes Recent Labs  Lab 12/30/18 0530 12/31/18 0514 01/02/19 0534  AST 147* 151* 179*  ALT 88* 88* 108*  ALKPHOS 123 142* 186*  BILITOT 1.0 1.0 1.0  ALBUMIN 2.7* 2.5* 2.7*    Cardiac Enzymes No results for input(s): TROPONINI, PROBNP in the last 168 hours.  Glucose Recent Labs  Lab 01/02/19 1655 01/02/19 2215 01/03/19 0919 01/03/19 1155 01/03/19 1657 01/03/19 2238  GLUCAP 126* 145* 143* 152* 153* 161*    Imaging Dg Chest 1 View  Result Date: 01/03/2019 CLINICAL DATA:  Shortness of breath EXAM: CHEST  1 VIEW COMPARISON:  January 03, 2019 FINDINGS: Bilateral pulmonary infiltrates are significantly worsened in the interval. The cardiomediastinal silhouette is normal. No pneumothorax. A right  Port-A-Cath remains stable. IMPRESSION: Bilateral pulmonary infiltrates are significantly worsened in the interval worrisome for multifocal pneumonia. Electronically Signed   By: Dorise Bullion III M.D   On: 01/03/2019 23:56   Dg Chest 2 View  Result Date: 01/03/2019 CLINICAL DATA:  Hypoxia today. EXAM: CHEST - 2 VIEW COMPARISON:  12/23/2018 FINDINGS: Right subclavian Port-A-Cath unchanged.  Lungs are adequately inflated a demonstrate mild patchy opacification over the anterior right middle lobe. Small amount of posterior right pleural fluid. Cardiomediastinal silhouette and remainder of the exam is unchanged. IMPRESSION: Patchy airspace opacification over the right middle lobe likely infection. Small amount right pleural fluid. Electronically Signed   By: Marin Olp M.D.   On: 01/03/2019 08:25   STUDIES:  None  CULTURES: Blood cultures x2  ANTIBIOTICS: Meropenem and doxycycline for possible intra-abdominal infection and pneumonia January 21, 2019>  LINES/TUBES: Right chest wall Mediport  DISCUSSION: his is a 49 year old Caucasian female with stage IV triple negative breast cancer with liver metastases now presenting with acute hypoxic respiratory failure, worsening abdominal pain, worsening thrombocytopenia, multifocal pneumonia and in shock  ASSESSMENT / PLAN:  PULMONARY A: Acute hypoxic respiratory failure  Multifocal pneumonia-COVID-19 test negative Lactic acidosis P:   Supplemental oxygen via high flow nasal cannula, titrate to maintain SPO2 greater than 90% Antibiotics as above Nebulized bronchodilators X-ray and ABG as needed Patient is at increased risk for respiratory arrest.    CARDIOVASCULAR A:  Sinus tachycardia Elevated troponins-likely due to demand ischemia Mild shock likely due to sepsis from pneumonia and intra-abdominal infection-systolic blood pressure 40 fluctuating between 80 and 96 mmHg and mean arterial blood pressure 65 and above P:  Heart rate improved  with pain control Albumin 50 g IV x1 followed by Lasix with improved blood pressure, heart rate and respiratory rate EKG without any ST changes suggestive of ACS Increase in troponin likely due to demand ischemia from elevated heart rate We will cycle cardiac enzymes and obtain serial EKGs  RENAL A:   Mild hypokalemia-potassium 5.2 Hyponatremia-sodium 130 P:   Trend renal indicis Monitor and correct electrolytes  GASTROINTESTINAL A:   Metastatic liver disease with ascites and possible spontaneous bacterial peritonitis Abdominal distention with severe pain likely due to tumor burden -lactic acid trending up P:   Pain control with current regimen Will recall oncology and palliative care to address goals of care   HEMATOLOGIC A:   Severe thrombocytopenia P:  Transfuse platelets as ordered Trend hemoglobin hematocrit and platelet level and transfuse as needed Oncology following Repeat platelet level posttransfusion  INFECTIOUS A:   Sepsis secondary to pneumonia and spontaneous bacterial peritonitis P:   Follow-up blood cultures Antibiotics as above Trend procalcitonin and adjust antibiotics  NEUROLOGIC A:   Severe pain due to malignancy P:   Shortness on an extensive pain regimen-fentanyl patch, as needed fentanyl and Dilaudid.  Patient given morphine 2 mg IV x1 due to severe dyspnea and air hunger  Best Practice: Code Status: Full code Diet: Regular GI prophylaxis: Protonix  VTE prophylaxis: SCDs and no pharmacologic VTE prophylaxis  FAMILY  - Updates: Family updated by admitting service.  Plan of care reviewed with patient.  She has decision-making capacity  Michele Kerlin S. Tukov-Yual ANP-BC Pulmonary and [place] Pager [phone] or [phone]  NB: This document was prepared using Dragon voice recognition software and may include unintentional dictation errors.    01/21/2019, 12:52 AM

## 2019-01-31 NOTE — Consult Note (Signed)
Pharmacy Antibiotic Note  Shelby [name] is a 49 y.o. female admitted on 12/10/2018 with intraabdominal infection. Patient transferred to ICU on 12/4 PM with worsening respiratory symptoms.  Pharmacy has been consulted for meropenem dosing. Patient is also being started on Doxycyline.  Patient was receiving cipro for  ESBL klebsiella in urine.   Plan: Start Meropenem 1g IV every 8 hours.   Height: 5\' 6"  (167.6 cm) Weight: 152 lb 5.4 oz (69.1 kg) IBW/kg (Calculated) : 59.3  Temp (24hrs), Avg:97.9 F (36.6 C), Min:97.7 F (36.5 C), Max:98.1 F (36.7 C)  Recent Labs  Lab 12/11/2018 0919 12/28/2018 1113 12/23/2018 1319 12/30/18 0530 12/31/18 0514 01/01/19 0528 01/02/19 0534 01/03/19 0457 Jan 28, 2019 0021  WBC 12.5*  --   --  10.5 9.8 13.6* 15.2* 20.7* 20.0*  CREATININE 0.56  --   --  0.63 0.58  --  0.51 0.49  --   LATICACIDVEN  --  2.8* 2.4*  --   --   --   --   --   --     Estimated Creatinine Clearance: 78.8 mL/min (by C-G formula based on SCr of 0.49 mg/dL).    Allergies  Allergen Reactions  . Hydrocodone Hives    Swollen face - many years ago. Thinks she has tolerated since.  . Iodine Hives  . Peanut Butter Flavor Other (See Comments)    Made mouth tingle  . Shellfish Allergy Hives  . Vancomycin Other (See Comments)    Itching and forehead turned red    Antimicrobials this admission: 11/29 Zosyn >> 12/1 12/1 CTX>> x1 12/2 Cipro>>  12/5 Meropenem> 12/5 Doxy>>   Microbiology results: 12/5  BCx: pending 11/29 UCx: ESBL klebsiella  12/4  MRSA PCR: pending  Thank you for allowing pharmacy to be a part of this patient's care.  Pernell Dupre, PharmD, BCPS Clinical Pharmacist 2019-01-28 1:11 AM

## 2019-01-31 NOTE — Progress Notes (Signed)
Went in to patient's room to do breathing tx and patient not responding to me.  I called out to the pt's RN who came into the room with me. Patient then became bradycardic and a CODE was called.

## 2019-01-31 NOTE — Progress Notes (Signed)
CRITICAL VALUE ALERT  Critical Value:  Trop 460  Date & Time Notied:  01-20-2019 0110  Provider Notified: Carolan Shiver NP  Orders Received/Actions taken: New orders received

## 2019-01-31 NOTE — Progress Notes (Signed)
Pt noted with brady down at 0751.  Unresponsive and cyanotic, no pulse palpated, Code Blue initiated at 0752.  Pt eventually tubed by ED physician and bagged by RT, multiple rounds Epi, no shocks d/t asystole, port reaccessed after huber needle disodged by compressions, IO also started.  Pt regained very weak pulse at least once, but quickly lost pulse again, family able to make it to bedside, but pt was pronounced dead at 0834 by intensivist.  MD Danford at bedside and also ED personnel.  Pt made ready for family to stay with her at bedside as long as needed.  Chaplain on unit to support them.  CCMD notified, pt not suitable for any donation.

## 2019-01-31 DEATH — deceased

## 2019-04-23 ENCOUNTER — Ambulatory Visit: Payer: No Typology Code available for payment source | Admitting: Radiation Oncology

## 2020-12-28 IMAGING — CT CT ABD-PELV W/ CM
2 of 5 series · 12 of 46 positions shown, 14 images · IV contrast (APPLIED)
Comparison: Abdominal CT without contrast yesterday. Chest CT
01/01/2018

CLINICAL DATA: Chest pain, complex, intermediate/high prob of
ACS/PE/AAS; Abd distension

Epigastric pain. Exertional dyspnea. Cough. Weakness. History of
breast cancer.
EXAM:
CT ANGIOGRAPHY CHEST
CT ABDOMEN AND PELVIS WITH CONTRAST
TECHNIQUE: Multidetector CT imaging of the chest was performed using the
standard protocol during bolus administration of intravenous
contrast. Multiplanar CT image reconstructions and MIPs were
obtained to evaluate the vascular anatomy. Multidetector CT imaging
of the abdomen and pelvis was performed using the standard protocol
during bolus administration of intravenous contrast.
No reported contrast reaction.
CONTRAST:  100mL OMNIPAQUE IOHEXOL 350 MG/ML SOLN

[Series 11: axial st · axial · 0.72mm/px · z∈[-993,-558]mm · 9 of 99 slices shown, 11 images]
[im 6/99  soft-tissue]
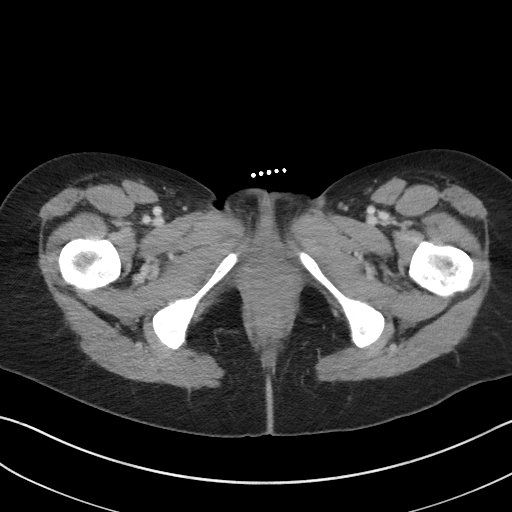
[im 6/99  bone]
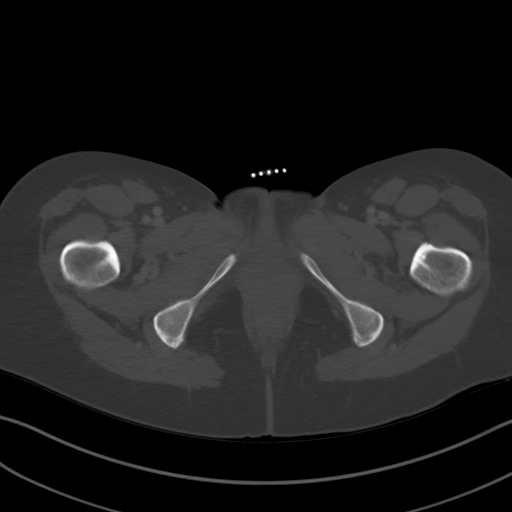
[im 18/99  soft-tissue]
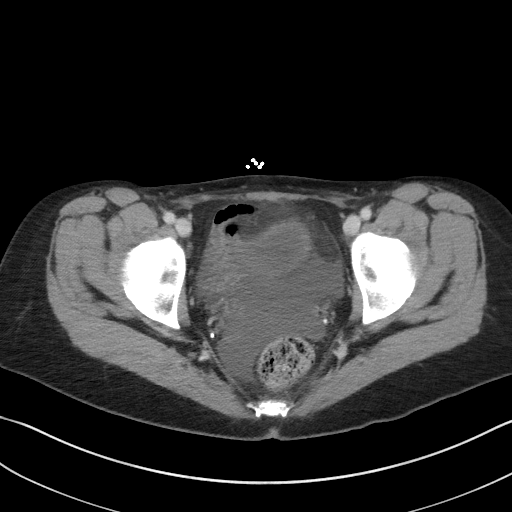
[im 29/99  soft-tissue]
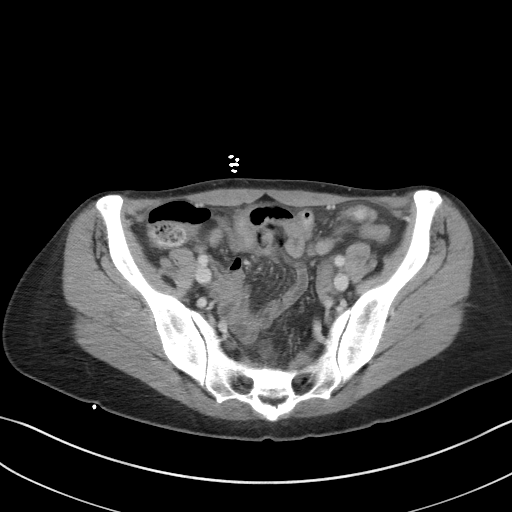
[im 41/99  soft-tissue]
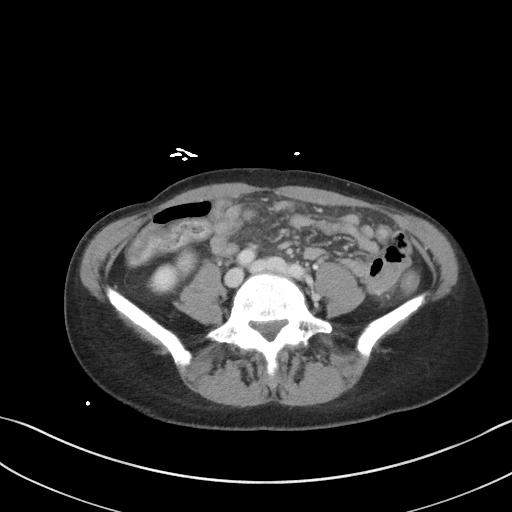
[im 52/99  soft-tissue]
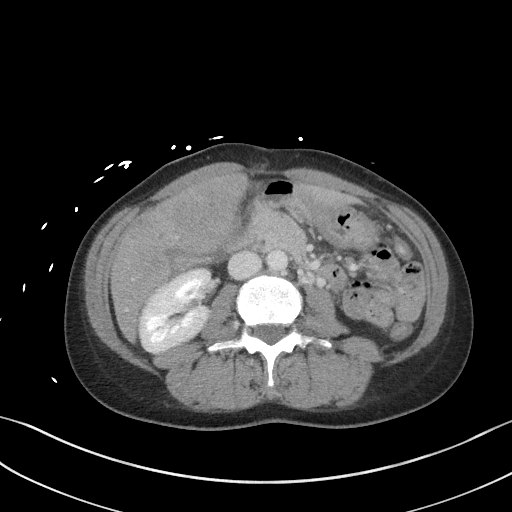
[im 58/99  soft-tissue]
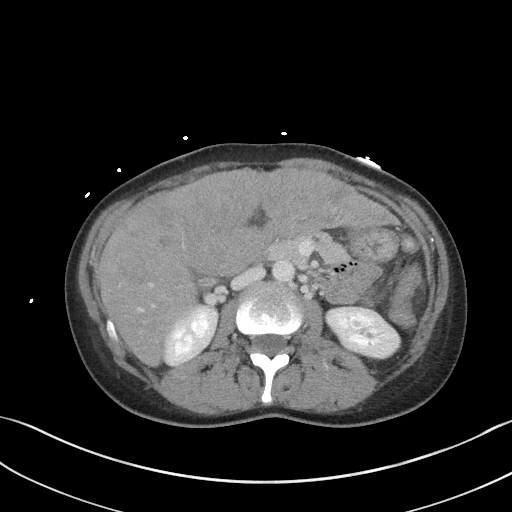
[im 70/99  soft-tissue]
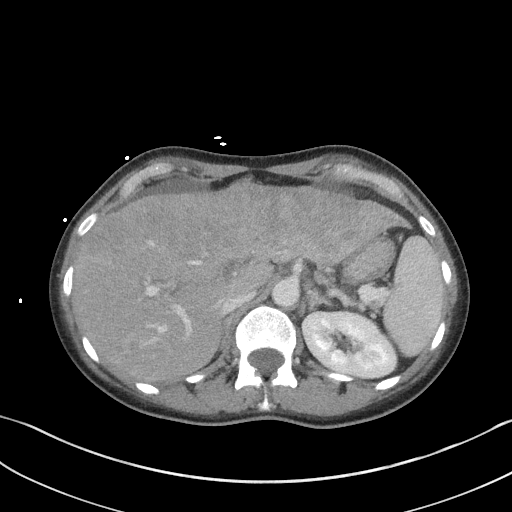
[im 81/99  soft-tissue]
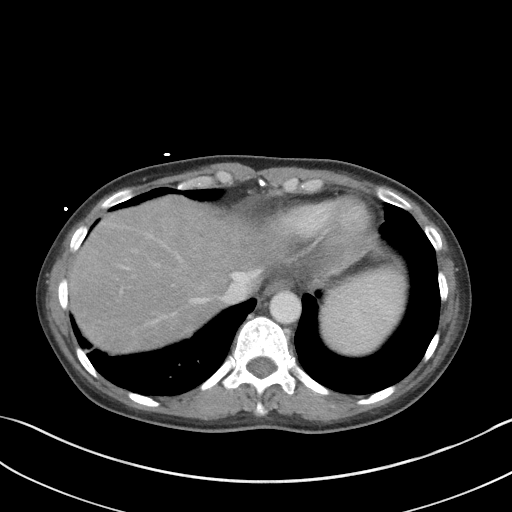
[im 93/99  soft-tissue]
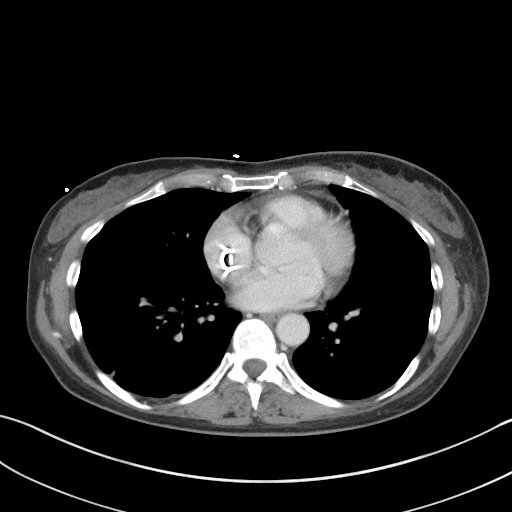
[im 93/99  bone]
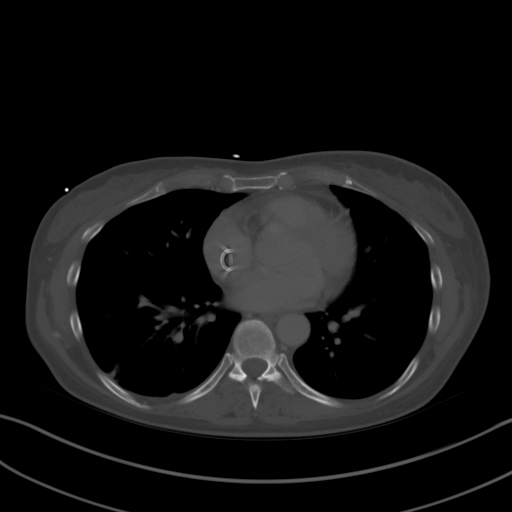

[Series 14: coronal st · coronal · 0.66mm/px · 3 of 69 slices shown]
[im 23/69  soft-tissue]
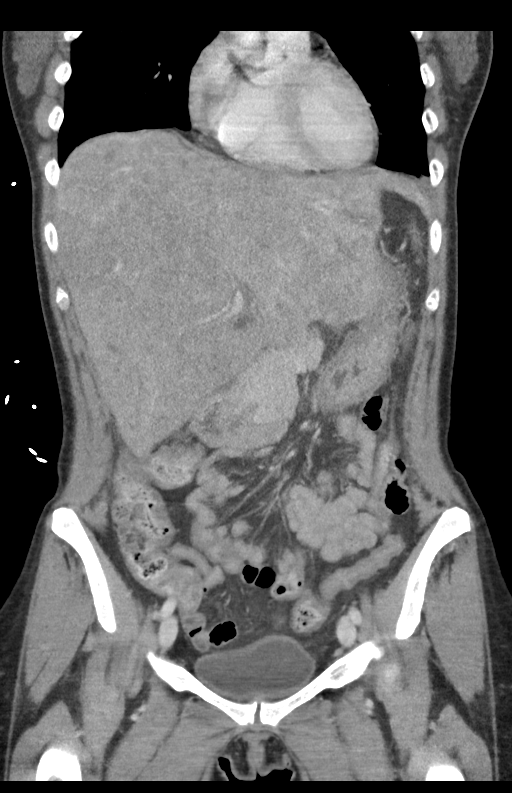
[im 31/69  soft-tissue]
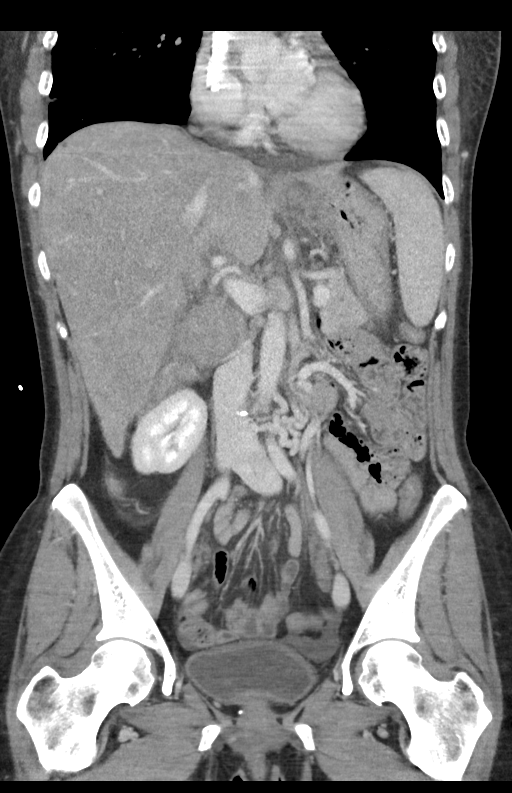
[im 38/69  soft-tissue]
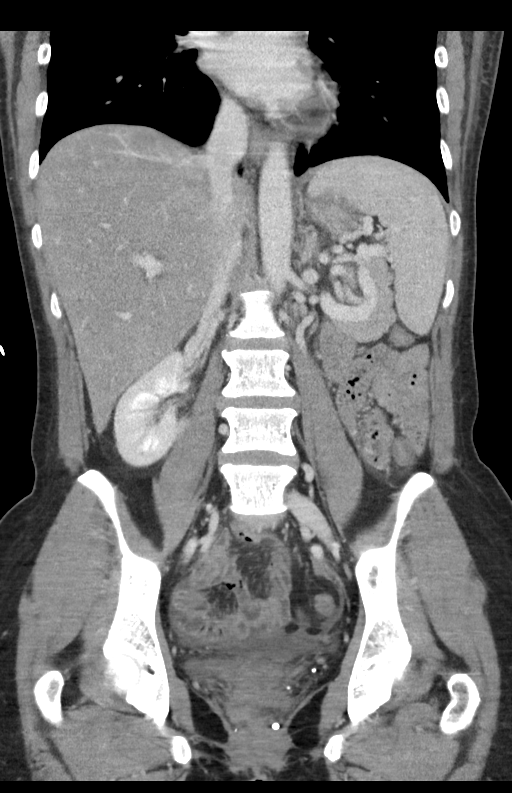

[12 of 46 positions shown; findings below may reference images not displayed]

FINDINGS: CTA CHEST FINDINGS

Cardiovascular: There are no filling defects within the pulmonary
arteries to suggest pulmonary embolus. Thoracic aorta is normal in
caliber without dissection. Left vertebral artery arises directly
from the thoracic aorta, variant arch anatomy. Heart is normal in
size. No pericardial effusion. Right chest port in place with tip in
the SVC.

Mediastinum/Nodes: No mediastinal or hilar adenopathy. No visualized
thyroid nodule. No esophageal wall thickening.

Lungs/Pleura: Mild patchy areas of subpleural nodular and
ground-glass opacities. Basilar findings are unchanged from CT
yesterday. 5 mm right upper lobe pulmonary nodule series 6, image
41. Sub solid nodularity slightly more inferiorly in the right lung
series 6, image 45 measuring 8 mm. Tiny 2 mm nodule in the right
upper lung series 6, image [DATE] be fissural. Pulmonary nodules
are new from prior chest CT. Perifissural in subpleural opacity in
the right upper lobe, also series 6, image 32, nonspecific. No
pulmonary edema. No pleural fluid. Trachea and mainstem bronchi are
patent.

Musculoskeletal: There are no acute or suspicious osseous
abnormalities.

Review of the MIP images confirms the above findings.

CT ABDOMEN and PELVIS FINDINGS

Hepatobiliary: Hepatic parenchyma diffusely heterogeneous with
nodular contours. Liver is increased in size from December 2017 CT.
Gallbladder is decompressed and not well assessed, intraluminal high
density may be stones or sludge. Questionable wall thickening may be
related to nondistention.

Pancreas: No ductal dilatation or inflammation.

Spleen: Normal in size without focal abnormality.

Adrenals/Urinary Tract: Normal adrenal glands. No hydronephrosis or
perinephric edema. Homogeneous renal enhancement with symmetric
excretion on delayed phase imaging. Urinary bladder is partially
distended, mild bladder wall thickening.

Stomach/Bowel: Lack of enteric contrast and paucity of
intra-abdominal fat limits detailed bowel assessment. The stomach is
nondistended. Questionable pre-pyloric gastric wall thickening. No
small bowel obstruction or inflammatory change. Appendix not
discretely visualized, no pericecal inflammation to suggest
appendicitis. Transverse colon is tortuous. Mild colonic wall
thickening at the splenic flexure, colon is decompressed, this is
not well assessed. Questionable areas of descending colonic wall
thickening, for example series 11, image 61.

Vascular/Lymphatic: Right portal vein is patent. The left portal
vein is attenuated without evidence of thrombus. Tortuous splenic
vein with prominent collaterals in the left upper quadrant and left
retroperitoneum. 9 mm perigastric lymph node series 11, image 29,
nonspecific. No pelvic adenopathy.

Reproductive: Uterus surgically absent. Left ovary tentatively
visualized and normal. Right ovary not definitively seen. No obvious
adnexal mass.

Other: Small volume of pelvic ascites measuring simple fluid
density, similar to yesterday. Small perihepatic ascites anteriorly.
No free air.

Musculoskeletal: There are no acute or suspicious osseous
abnormalities. Scattered bone islands in the pelvis.

Review of the MIP images confirms the above findings.
IMPRESSION: CT chest:

1. No pulmonary embolus.
2. Mild patchy areas of subpleural nodular and ground-glass
opacities in both lungs. Slightly more confluent
subpleural/perifissural right upper lobe opacity. Findings may be
due to atelectasis or infection.
3. Small pulmonary nodules are new from [place],
infectious/inflammatory or metastatic.

CT abdomen/pelvis:

1. Diffusely heterogeneous liver with nodular contours. Liver has
increased in size since December 2017 CT. This may be due to
underlying cirrhotic change versus diffuse metastatic disease.
Recommend further evaluation with abdominal MRI. Left upper quadrant
and retroperitoneal collaterals can be seen with portal
hypertension. Left portal vein is attenuated, however no discrete
thrombus.
2. Questionable pre-pyloric gastric wall thickening, peptic ulcer
disease versus gastritis.
3. Bladder wall thickening, small volume abdominopelvic ascites, and
areas of colonic wall thickening at the splenic flexure and
descending colon are unchanged from CT yesterday.
4. Gallbladder is decompressed and not well assessed, intraluminal
high density may be stones or sludge. Questionable gallbladder wall
thickening may be related to nondistention or liver disease. This
could be further evaluated with MRI or right upper quadrant
ultrasound.
